# Patient Record
Sex: Female | Born: 1940 | Race: White | Hispanic: No | Marital: Married | Smoking: Former smoker
Health system: Southern US, Community
[De-identification: ages and names within clinical notes are randomized; demographics above are authoritative.]

## PROBLEM LIST (undated history)

## (undated) DIAGNOSIS — C3490 Malignant neoplasm of unspecified part of unspecified bronchus or lung: Secondary | ICD-10-CM

## (undated) DIAGNOSIS — I499 Cardiac arrhythmia, unspecified: Secondary | ICD-10-CM

## (undated) DIAGNOSIS — M069 Rheumatoid arthritis, unspecified: Secondary | ICD-10-CM

## (undated) DIAGNOSIS — K219 Gastro-esophageal reflux disease without esophagitis: Secondary | ICD-10-CM

## (undated) DIAGNOSIS — K649 Unspecified hemorrhoids: Secondary | ICD-10-CM

## (undated) DIAGNOSIS — I4819 Other persistent atrial fibrillation: Secondary | ICD-10-CM

## (undated) DIAGNOSIS — I4891 Unspecified atrial fibrillation: Secondary | ICD-10-CM

## (undated) DIAGNOSIS — E785 Hyperlipidemia, unspecified: Secondary | ICD-10-CM

## (undated) DIAGNOSIS — I10 Essential (primary) hypertension: Secondary | ICD-10-CM

## (undated) DIAGNOSIS — E669 Obesity, unspecified: Secondary | ICD-10-CM

## (undated) HISTORY — PX: KNEE SURGERY: SHX244

## (undated) HISTORY — PX: BREAST EXCISIONAL BIOPSY: SUR124

## (undated) HISTORY — DX: Malignant neoplasm of unspecified part of unspecified bronchus or lung: C34.90

---

## 1998-08-22 DIAGNOSIS — C3490 Malignant neoplasm of unspecified part of unspecified bronchus or lung: Secondary | ICD-10-CM

## 1998-08-22 HISTORY — PX: LUNG LOBECTOMY: SHX167

## 1998-08-22 HISTORY — DX: Malignant neoplasm of unspecified part of unspecified bronchus or lung: C34.90

## 2016-03-16 ENCOUNTER — Ambulatory Visit: Payer: Self-pay | Admitting: Family

## 2016-05-31 ENCOUNTER — Ambulatory Visit: Payer: Self-pay | Admitting: Internal Medicine

## 2017-01-04 ENCOUNTER — Other Ambulatory Visit: Payer: Self-pay | Admitting: Family Medicine

## 2017-01-04 DIAGNOSIS — Z1231 Encounter for screening mammogram for malignant neoplasm of breast: Secondary | ICD-10-CM

## 2017-01-04 DIAGNOSIS — Z780 Asymptomatic menopausal state: Secondary | ICD-10-CM

## 2017-01-24 ENCOUNTER — Other Ambulatory Visit: Payer: Self-pay | Admitting: Family Medicine

## 2017-01-24 DIAGNOSIS — E042 Nontoxic multinodular goiter: Secondary | ICD-10-CM

## 2017-02-02 ENCOUNTER — Inpatient Hospital Stay
Admission: RE | Admit: 2017-02-02 | Discharge: 2017-02-02 | Disposition: A | Discharge status: Home / Self Care | Payer: Self-pay | Source: Ambulatory Visit | Attending: Family Medicine | Admitting: Family Medicine

## 2017-02-02 ENCOUNTER — Other Ambulatory Visit: Payer: Self-pay

## 2017-02-27 ENCOUNTER — Other Ambulatory Visit: Payer: Self-pay | Admitting: Family Medicine

## 2017-02-27 DIAGNOSIS — E2839 Other primary ovarian failure: Secondary | ICD-10-CM

## 2017-02-27 DIAGNOSIS — Z780 Asymptomatic menopausal state: Secondary | ICD-10-CM

## 2017-02-28 ENCOUNTER — Other Ambulatory Visit: Payer: Self-pay

## 2017-03-29 ENCOUNTER — Other Ambulatory Visit: Payer: Self-pay

## 2017-04-25 ENCOUNTER — Ambulatory Visit
Admission: RE | Admit: 2017-04-25 | Discharge: 2017-04-25 | Disposition: A | Discharge status: Home / Self Care | Payer: Medicare HMO | Source: Ambulatory Visit | Attending: Family Medicine | Admitting: Family Medicine

## 2017-04-25 DIAGNOSIS — Z780 Asymptomatic menopausal state: Secondary | ICD-10-CM

## 2017-04-25 DIAGNOSIS — E2839 Other primary ovarian failure: Secondary | ICD-10-CM

## 2017-04-25 DIAGNOSIS — E042 Nontoxic multinodular goiter: Secondary | ICD-10-CM

## 2017-04-25 DIAGNOSIS — Z1231 Encounter for screening mammogram for malignant neoplasm of breast: Secondary | ICD-10-CM

## 2017-10-21 ENCOUNTER — Emergency Department (HOSPITAL_COMMUNITY): Payer: Medicare HMO

## 2017-10-21 ENCOUNTER — Encounter (HOSPITAL_COMMUNITY): Payer: Self-pay | Admitting: *Deleted

## 2017-10-21 ENCOUNTER — Inpatient Hospital Stay (HOSPITAL_COMMUNITY)
Admission: EM | Admit: 2017-10-21 | Discharge: 2017-10-26 | DRG: 871 | Disposition: A | Discharge status: Home Health | Payer: Medicare HMO | Attending: Family Medicine | Admitting: Family Medicine

## 2017-10-21 ENCOUNTER — Encounter (HOSPITAL_COMMUNITY): Payer: Self-pay | Admitting: Emergency Medicine

## 2017-10-21 ENCOUNTER — Emergency Department (HOSPITAL_COMMUNITY)
Admission: EM | Admit: 2017-10-21 | Discharge: 2017-10-21 | Disposition: A | Discharge status: Home / Self Care | Payer: Medicare HMO | Source: Home / Self Care | Attending: Emergency Medicine | Admitting: Emergency Medicine

## 2017-10-21 ENCOUNTER — Other Ambulatory Visit: Payer: Self-pay

## 2017-10-21 DIAGNOSIS — R509 Fever, unspecified: Secondary | ICD-10-CM | POA: Insufficient documentation

## 2017-10-21 DIAGNOSIS — J189 Pneumonia, unspecified organism: Secondary | ICD-10-CM | POA: Diagnosis present

## 2017-10-21 DIAGNOSIS — Y92013 Bedroom of single-family (private) house as the place of occurrence of the external cause: Secondary | ICD-10-CM | POA: Insufficient documentation

## 2017-10-21 DIAGNOSIS — Z96651 Presence of right artificial knee joint: Secondary | ICD-10-CM

## 2017-10-21 DIAGNOSIS — R531 Weakness: Secondary | ICD-10-CM

## 2017-10-21 DIAGNOSIS — I4819 Other persistent atrial fibrillation: Secondary | ICD-10-CM | POA: Diagnosis present

## 2017-10-21 DIAGNOSIS — J181 Lobar pneumonia, unspecified organism: Secondary | ICD-10-CM | POA: Insufficient documentation

## 2017-10-21 DIAGNOSIS — W06XXXA Fall from bed, initial encounter: Secondary | ICD-10-CM

## 2017-10-21 DIAGNOSIS — J9601 Acute respiratory failure with hypoxia: Secondary | ICD-10-CM | POA: Diagnosis not present

## 2017-10-21 DIAGNOSIS — I10 Essential (primary) hypertension: Secondary | ICD-10-CM | POA: Diagnosis not present

## 2017-10-21 DIAGNOSIS — E785 Hyperlipidemia, unspecified: Secondary | ICD-10-CM | POA: Diagnosis not present

## 2017-10-21 DIAGNOSIS — Z885 Allergy status to narcotic agent status: Secondary | ICD-10-CM

## 2017-10-21 DIAGNOSIS — J441 Chronic obstructive pulmonary disease with (acute) exacerbation: Secondary | ICD-10-CM | POA: Diagnosis present

## 2017-10-21 DIAGNOSIS — I4891 Unspecified atrial fibrillation: Secondary | ICD-10-CM | POA: Diagnosis not present

## 2017-10-21 DIAGNOSIS — Z79891 Long term (current) use of opiate analgesic: Secondary | ICD-10-CM

## 2017-10-21 DIAGNOSIS — A4189 Other specified sepsis: Principal | ICD-10-CM | POA: Diagnosis present

## 2017-10-21 DIAGNOSIS — I481 Persistent atrial fibrillation: Secondary | ICD-10-CM | POA: Diagnosis present

## 2017-10-21 DIAGNOSIS — Z85118 Personal history of other malignant neoplasm of bronchus and lung: Secondary | ICD-10-CM

## 2017-10-21 DIAGNOSIS — D696 Thrombocytopenia, unspecified: Secondary | ICD-10-CM | POA: Diagnosis present

## 2017-10-21 DIAGNOSIS — J440 Chronic obstructive pulmonary disease with acute lower respiratory infection: Secondary | ICD-10-CM | POA: Diagnosis present

## 2017-10-21 DIAGNOSIS — J1000 Influenza due to other identified influenza virus with unspecified type of pneumonia: Secondary | ICD-10-CM | POA: Diagnosis present

## 2017-10-21 DIAGNOSIS — J9811 Atelectasis: Secondary | ICD-10-CM | POA: Diagnosis present

## 2017-10-21 DIAGNOSIS — R05 Cough: Secondary | ICD-10-CM | POA: Insufficient documentation

## 2017-10-21 DIAGNOSIS — M1120 Other chondrocalcinosis, unspecified site: Secondary | ICD-10-CM | POA: Diagnosis present

## 2017-10-21 DIAGNOSIS — Z87891 Personal history of nicotine dependence: Secondary | ICD-10-CM

## 2017-10-21 DIAGNOSIS — J129 Viral pneumonia, unspecified: Secondary | ICD-10-CM | POA: Diagnosis present

## 2017-10-21 DIAGNOSIS — Z6841 Body Mass Index (BMI) 40.0 and over, adult: Secondary | ICD-10-CM

## 2017-10-21 DIAGNOSIS — Z882 Allergy status to sulfonamides status: Secondary | ICD-10-CM

## 2017-10-21 DIAGNOSIS — Y92003 Bedroom of unspecified non-institutional (private) residence as the place of occurrence of the external cause: Secondary | ICD-10-CM

## 2017-10-21 DIAGNOSIS — Z79899 Other long term (current) drug therapy: Secondary | ICD-10-CM

## 2017-10-21 HISTORY — DX: Essential (primary) hypertension: I10

## 2017-10-21 LAB — COMPREHENSIVE METABOLIC PANEL
ALT: 38 U/L (ref 14–54)
AST: 48 U/L — AB (ref 15–41)
Albumin: 3.2 g/dL — ABNORMAL LOW (ref 3.5–5.0)
Alkaline Phosphatase: 83 U/L (ref 38–126)
Anion gap: 7 (ref 5–15)
BILIRUBIN TOTAL: 0.7 mg/dL (ref 0.3–1.2)
BUN: 15 mg/dL (ref 6–20)
CHLORIDE: 100 mmol/L — AB (ref 101–111)
CO2: 25 mmol/L (ref 22–32)
Calcium: 8.7 mg/dL — ABNORMAL LOW (ref 8.9–10.3)
Creatinine, Ser: 0.96 mg/dL (ref 0.44–1.00)
GFR calc Af Amer: >60 mL/min (ref >60)
GFR, EST NON AFRICAN AMERICAN: 57 mL/min — AB (ref >60)
Glucose, Bld: 115 mg/dL — ABNORMAL HIGH (ref 65–99)
Potassium: 4.1 mmol/L (ref 3.5–5.1)
Sodium: 132 mmol/L — ABNORMAL LOW (ref 135–145)
TOTAL PROTEIN: 6.8 g/dL (ref 6.5–8.1)

## 2017-10-21 LAB — BASIC METABOLIC PANEL
Anion gap: 7 (ref 5–15)
BUN: 14 mg/dL (ref 6–20)
CHLORIDE: 104 mmol/L (ref 101–111)
CO2: 24 mmol/L (ref 22–32)
CREATININE: 0.73 mg/dL (ref 0.44–1.00)
Calcium: 7.8 mg/dL — ABNORMAL LOW (ref 8.9–10.3)
GFR calc Af Amer: >60 mL/min (ref >60)
GFR calc non Af Amer: >60 mL/min (ref >60)
GLUCOSE: 126 mg/dL — AB (ref 65–99)
POTASSIUM: 3.2 mmol/L — AB (ref 3.5–5.1)
SODIUM: 135 mmol/L (ref 135–145)

## 2017-10-21 LAB — CBC WITH DIFFERENTIAL/PLATELET
Basophils Absolute: 0 10*3/uL (ref 0.0–0.1)
Basophils Relative: 0 %
EOS ABS: 0 10*3/uL (ref 0.0–0.7)
Eosinophils Relative: 0 %
HCT: 38.7 % (ref 36.0–46.0)
HEMOGLOBIN: 12.9 g/dL (ref 12.0–15.0)
LYMPHS ABS: 0.6 10*3/uL — AB (ref 0.7–4.0)
LYMPHS PCT: 11 %
MCH: 32.1 pg (ref 26.0–34.0)
MCHC: 33.3 g/dL (ref 30.0–36.0)
MCV: 96.3 fL (ref 78.0–100.0)
MONOS PCT: 5 %
Monocytes Absolute: 0.3 10*3/uL (ref 0.1–1.0)
NEUTROS PCT: 84 %
Neutro Abs: 4.8 10*3/uL (ref 1.7–7.7)
Platelets: 139 10*3/uL — ABNORMAL LOW (ref 150–400)
RBC: 4.02 MIL/uL (ref 3.87–5.11)
RDW: 13.9 % (ref 11.5–15.5)
WBC: 5.7 10*3/uL (ref 4.0–10.5)

## 2017-10-21 LAB — I-STAT TROPONIN, ED: TROPONIN I, POC: 0.01 ng/mL (ref 0.00–0.08)

## 2017-10-21 LAB — I-STAT CG4 LACTIC ACID, ED: Lactic Acid, Venous: 0.64 mmol/L (ref 0.5–1.9)

## 2017-10-21 LAB — CK: CK TOTAL: 41 U/L (ref 38–234)

## 2017-10-21 IMAGING — CR DG CHEST 2V
2 series · 2 of 2 positions shown · non-contrast
Comparison: None.

CLINICAL DATA: Pt c/o diffuse back and leg pain, worse at hip s/p
slip, fall from bed (pt states she slipped from her satin sheets)
this am.

EXAM:
CHEST  2 VIEW

[w chest lat]
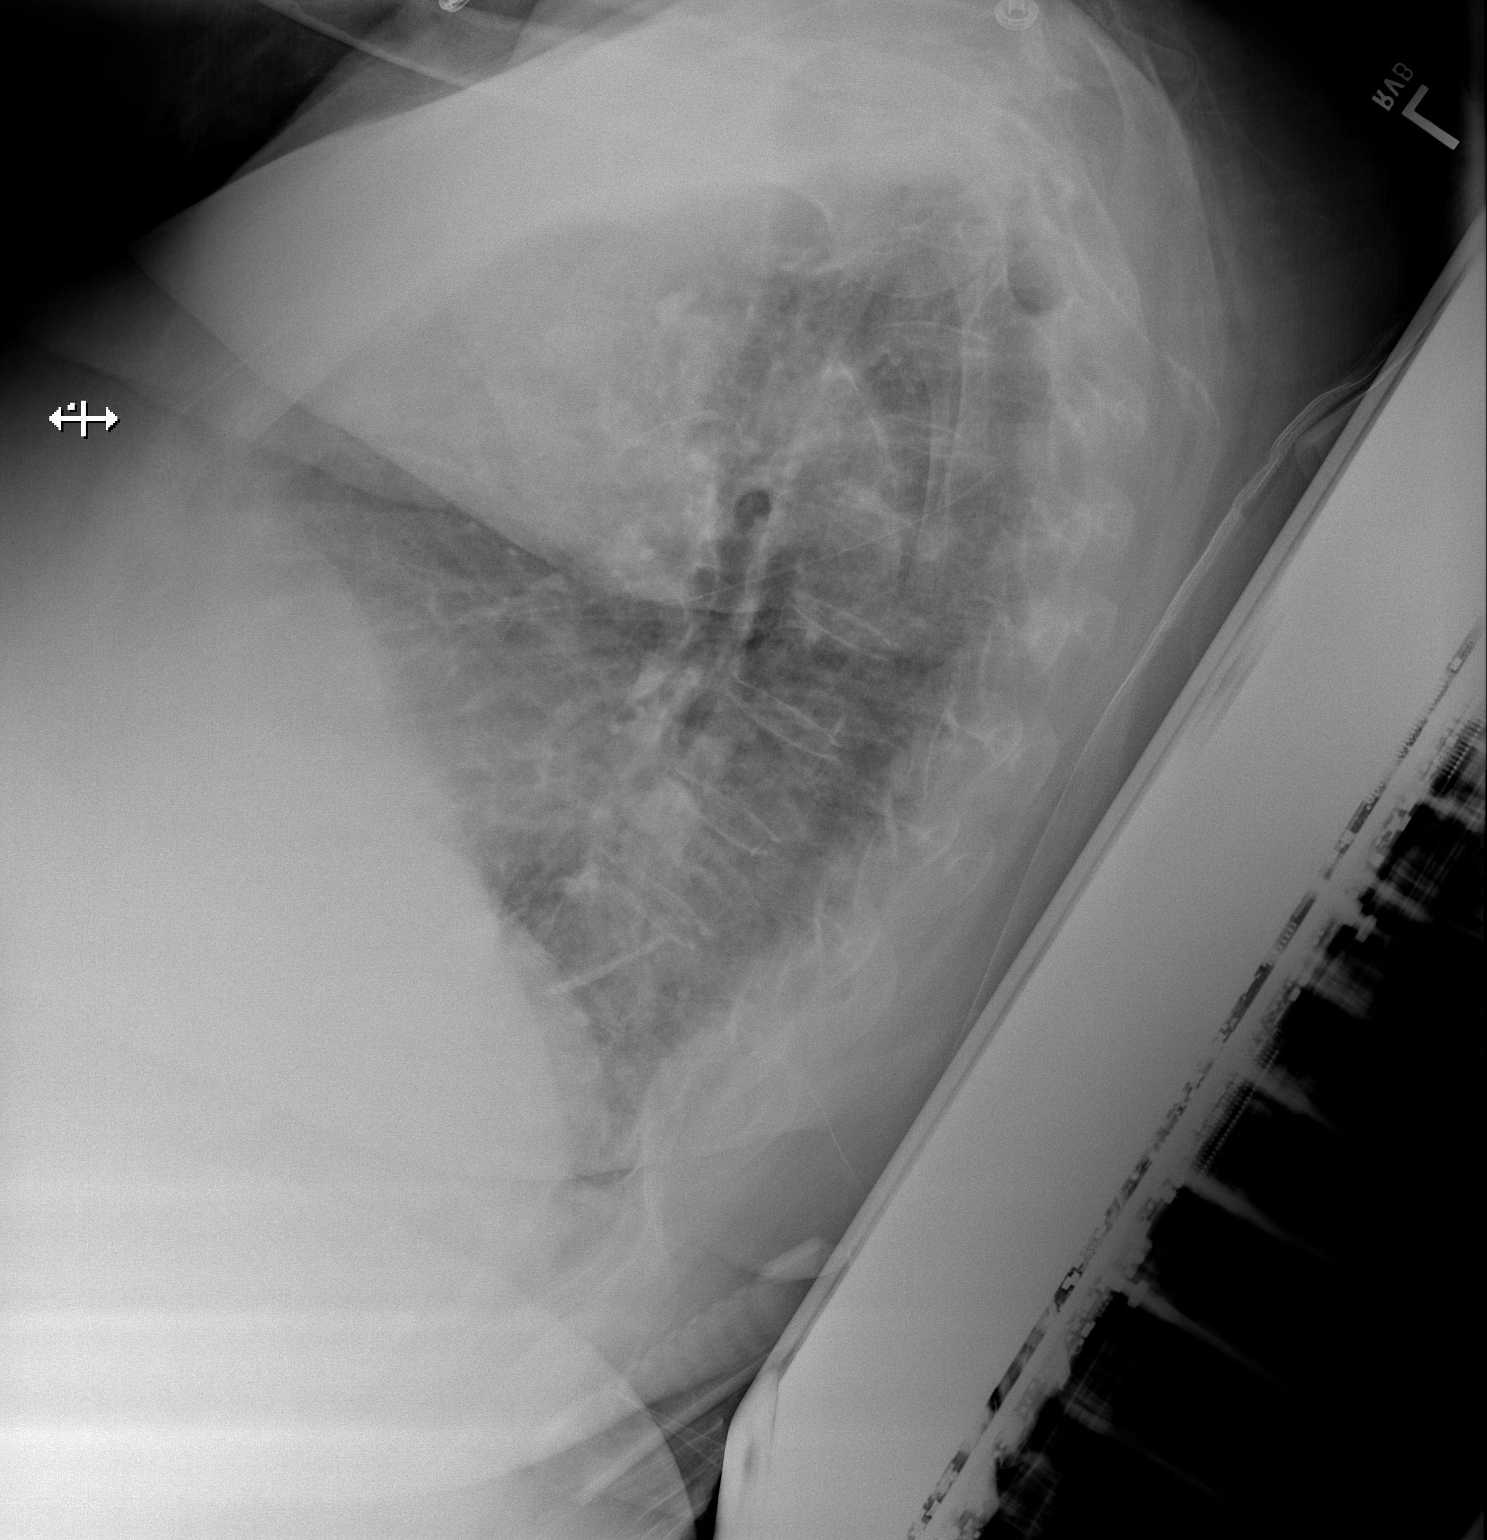

[x chest ap]
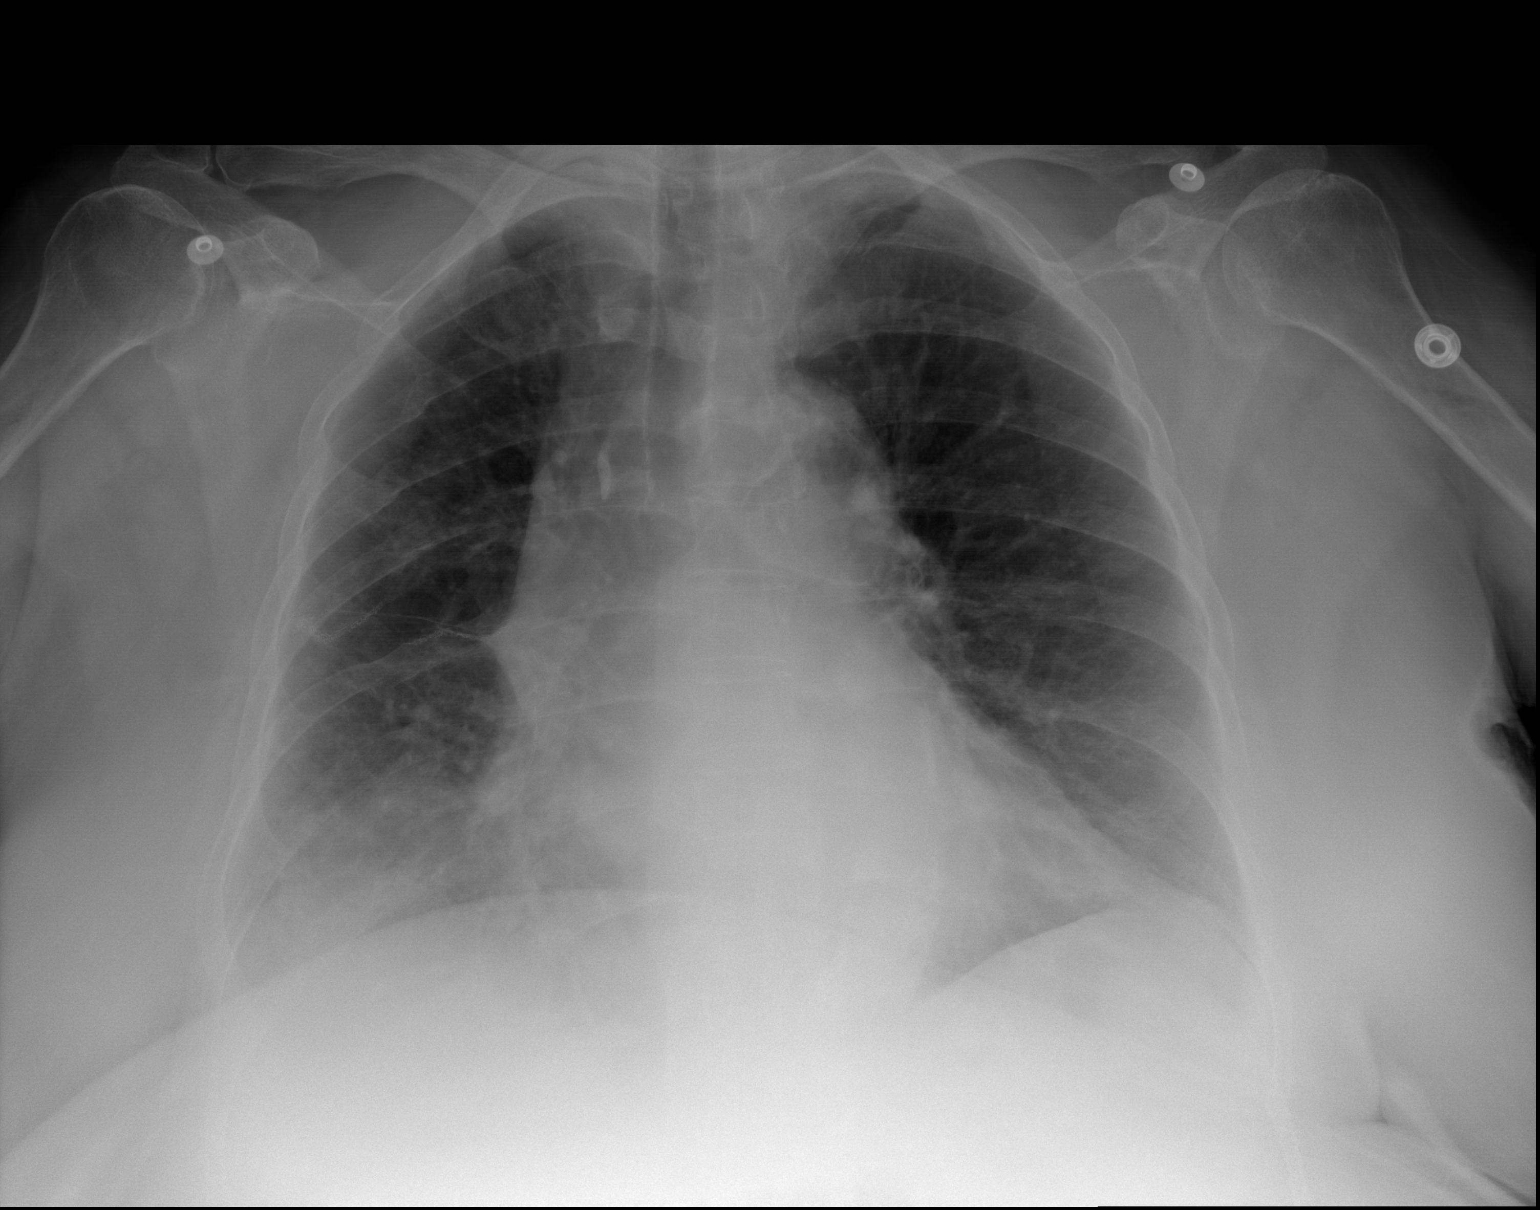

[2 of 2 positions shown; findings below may reference images not displayed]

FINDINGS: Cardiac silhouette is normal in size. No mediastinal or hilar
masses.

There is hazy opacity at the lung bases, right greater than left. On
the right, the silhouettes the hemidiaphragm. This may reflect a
combination of atelectasis with a small effusion on the right and
atelectasis on the left. Pneumonia should be considered likely if
there are consistent clinical symptoms. No convincing pulmonary
edema. No pneumothorax.

There are old healed right rib fractures. Skeletal structures are
demineralized.
IMPRESSION: 1. Right greater than left lung base opacity. This may reflect
pneumonia. Alternatively, this may be due to atelectasis, with a
possible pleural effusion on the right.

## 2017-10-21 IMAGING — CR DG HIP (WITH OR WITHOUT PELVIS) 2-3V*R*
4 series · 4 of 4 positions shown · non-contrast
Comparison: None.

CLINICAL DATA: Pt c/o diffuse leg pain, worse at hip s/p slip, fall
from bed (pt states she slipped from her satin sheets) this am.

EXAM:
DG HIP (WITH OR WITHOUT PELVIS) 2-3V RIGHT

[w hip lat right]
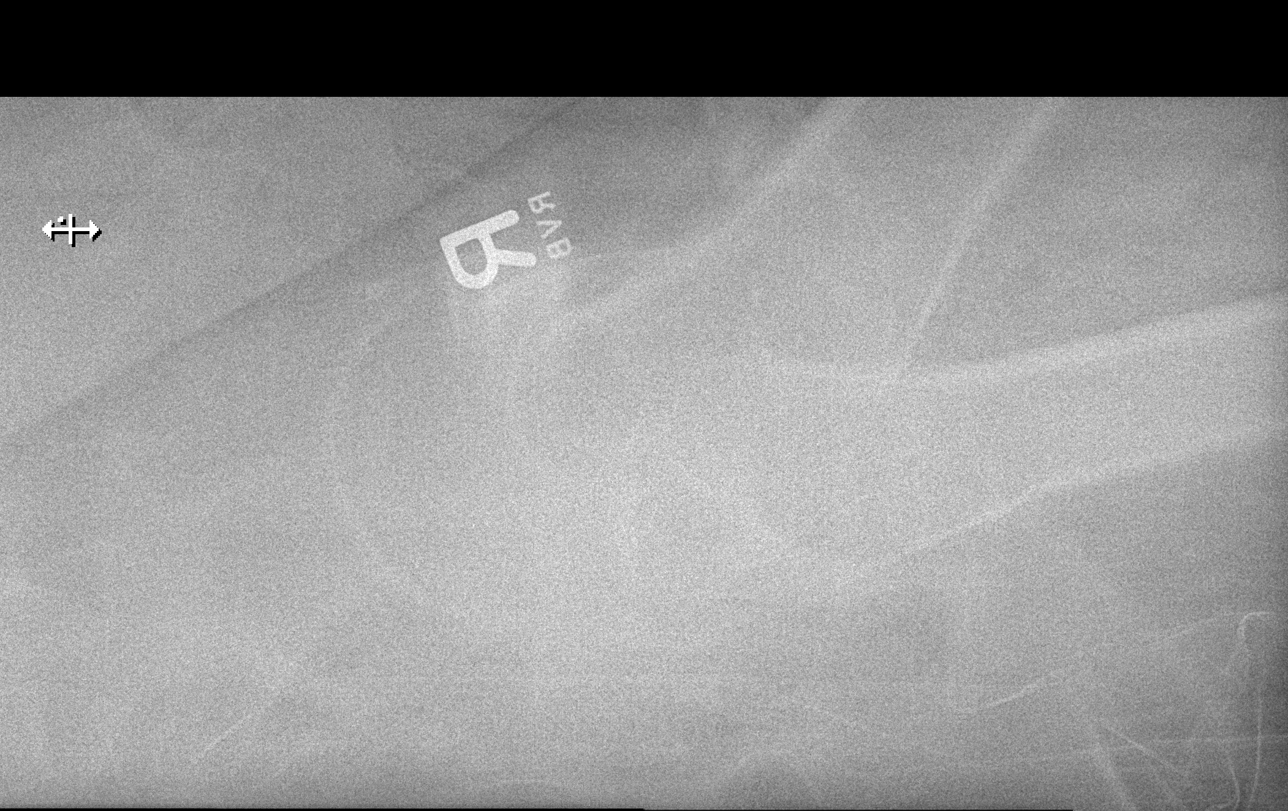

[x pelvis]
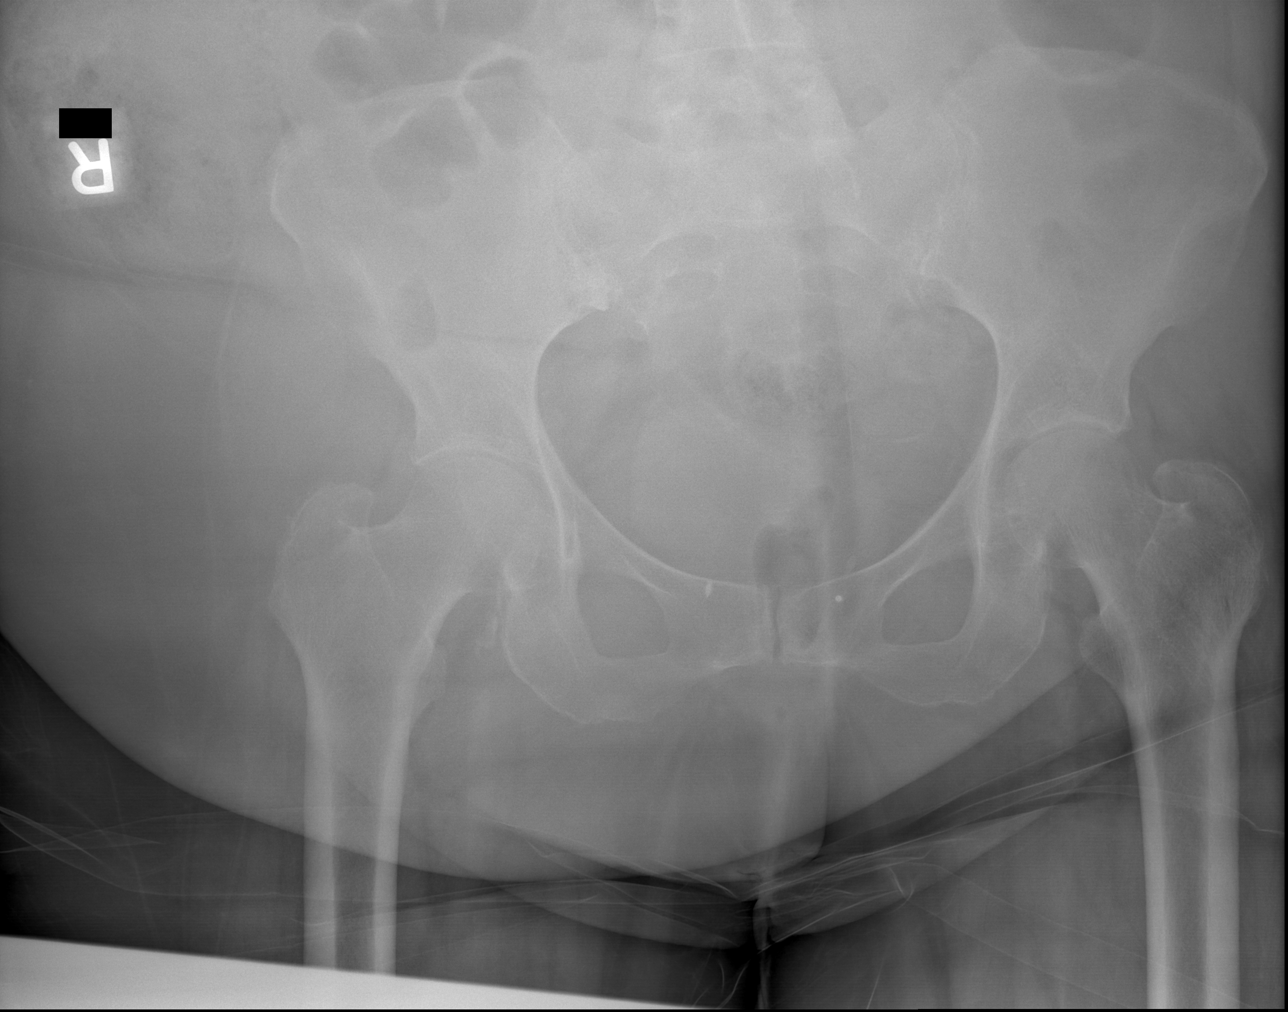

[x hip ap right]
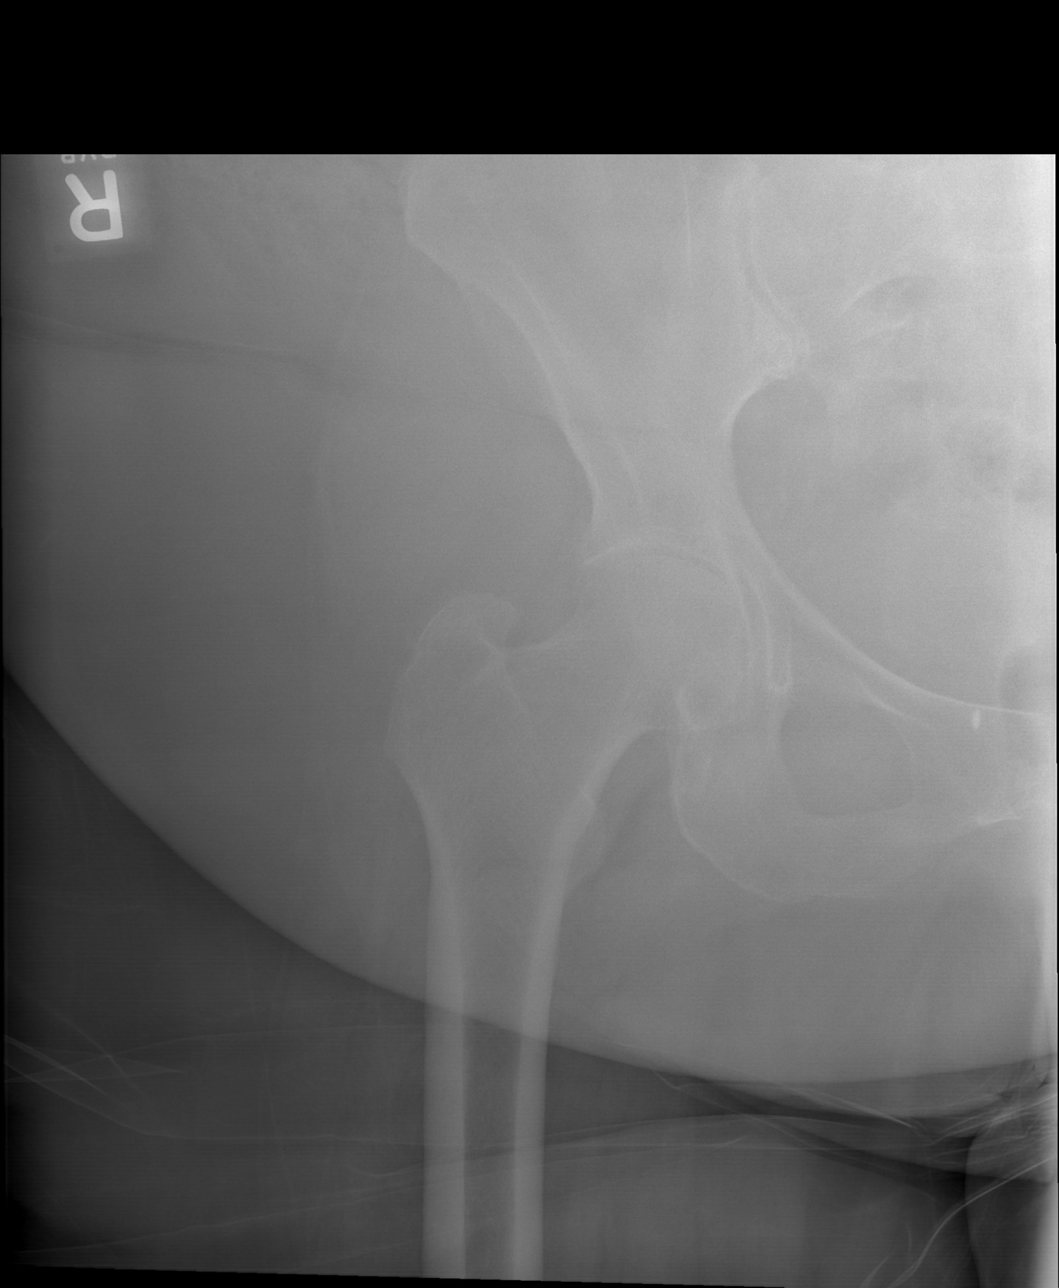

[x hip lat right]
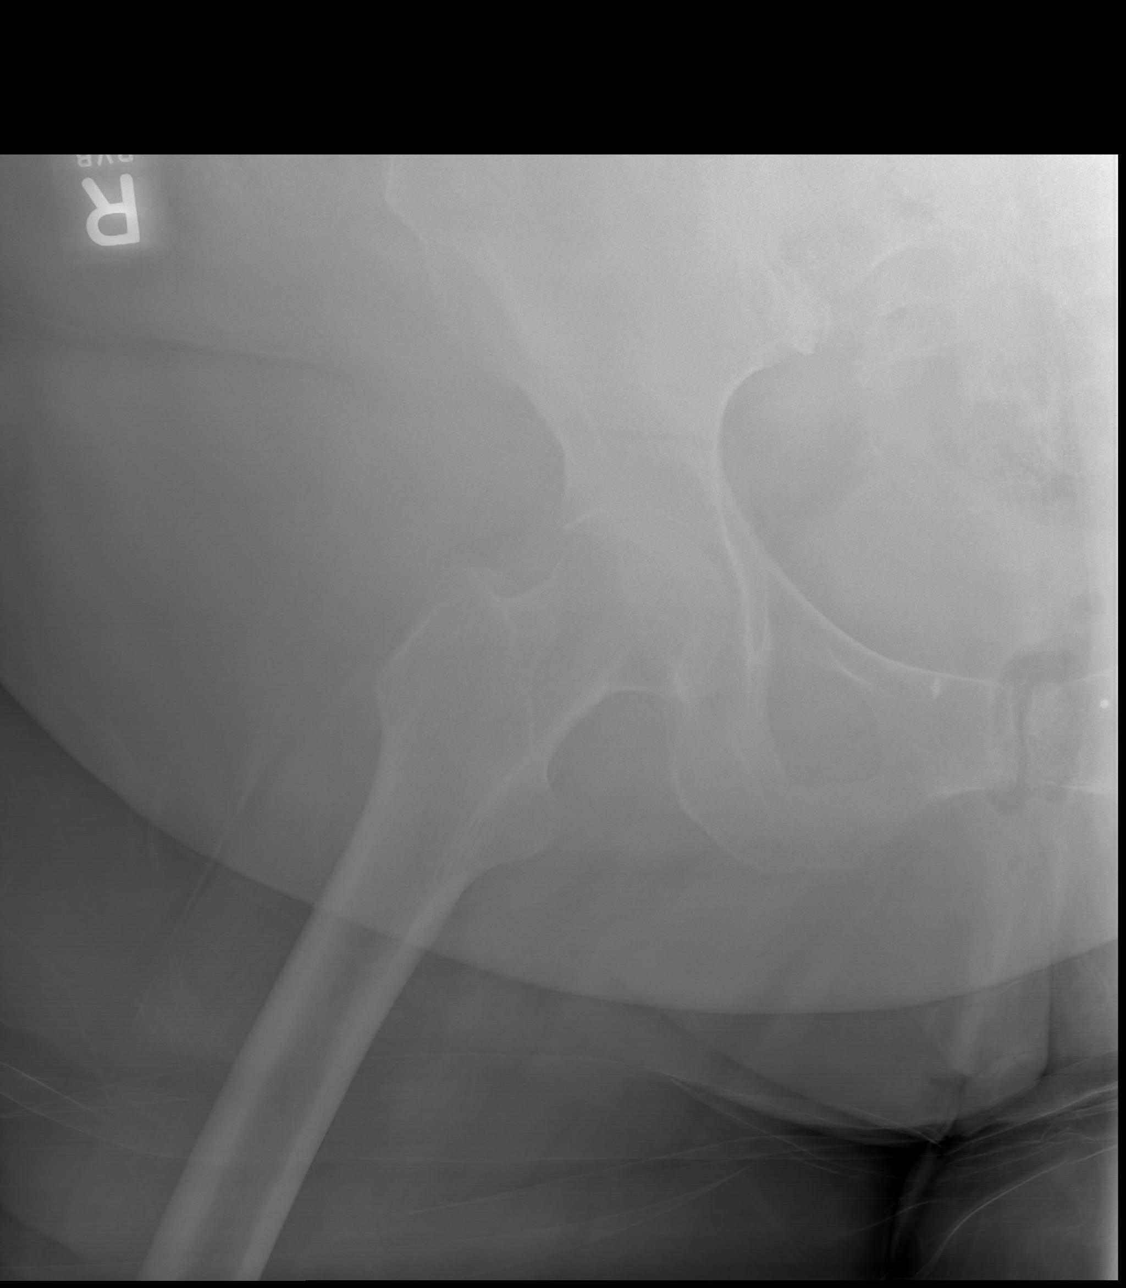

[4 of 4 positions shown; findings below may reference images not displayed]

FINDINGS: No fracture.  No bone lesion.

There is mild bilateral concentric hip joint space narrowing. No
other arthropathic change.

SI joints and symphysis pubis are normally aligned.

Soft tissues are unremarkable.
IMPRESSION: 1. No fracture or dislocation.  No acute finding.

## 2017-10-21 IMAGING — CR DG KNEE COMPLETE 4+V*R*
4 series · 4 of 4 positions shown · non-contrast
Comparison: None.

CLINICAL DATA: Pt c/o diffuse back and leg pain, worse at hip s/p
slip, fall from bed (pt states she slipped from her satin sheets)
this am.

EXAM:
RIGHT KNEE - COMPLETE 4+ VIEW

[x knee ap right (1 of 4)]
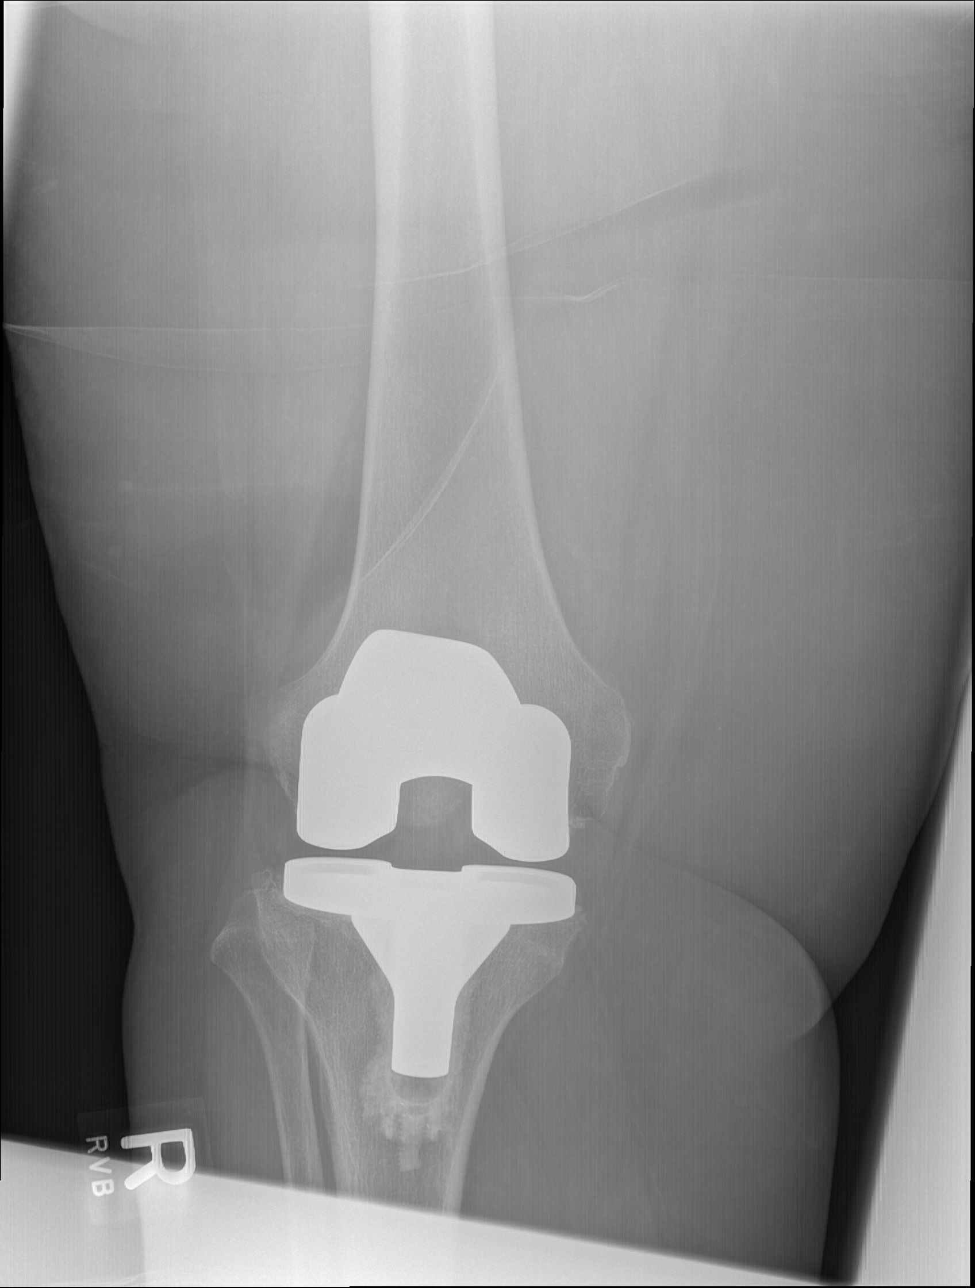

[x knee ap right (2 of 4)]
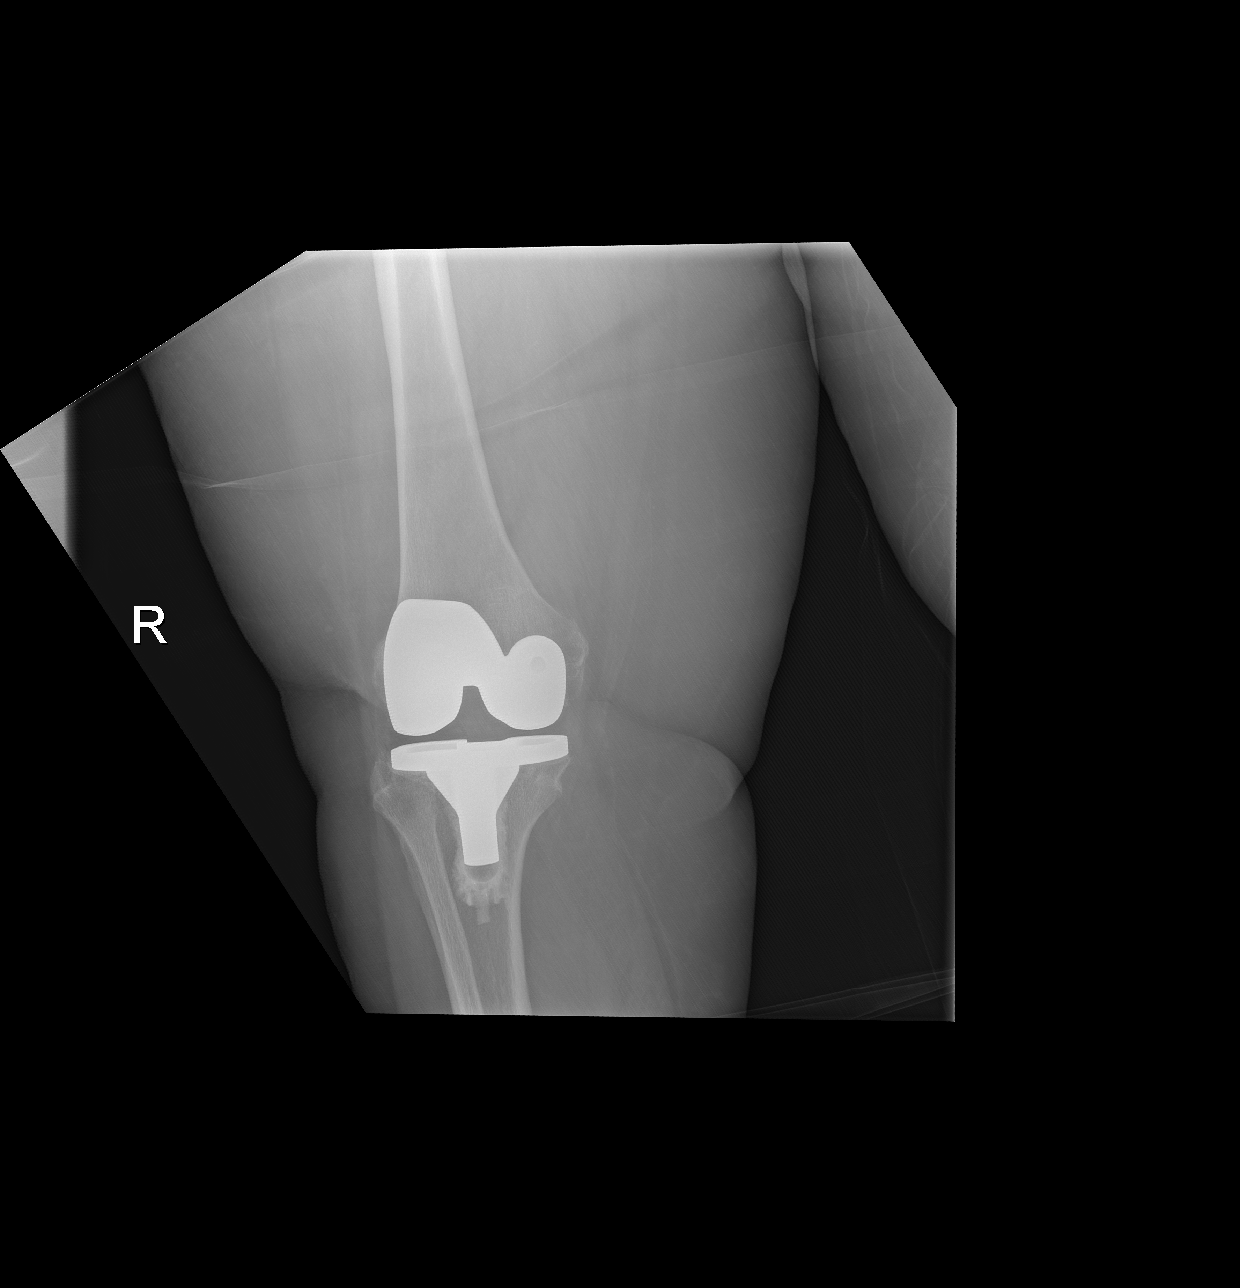

[x knee ap right (3 of 4)]
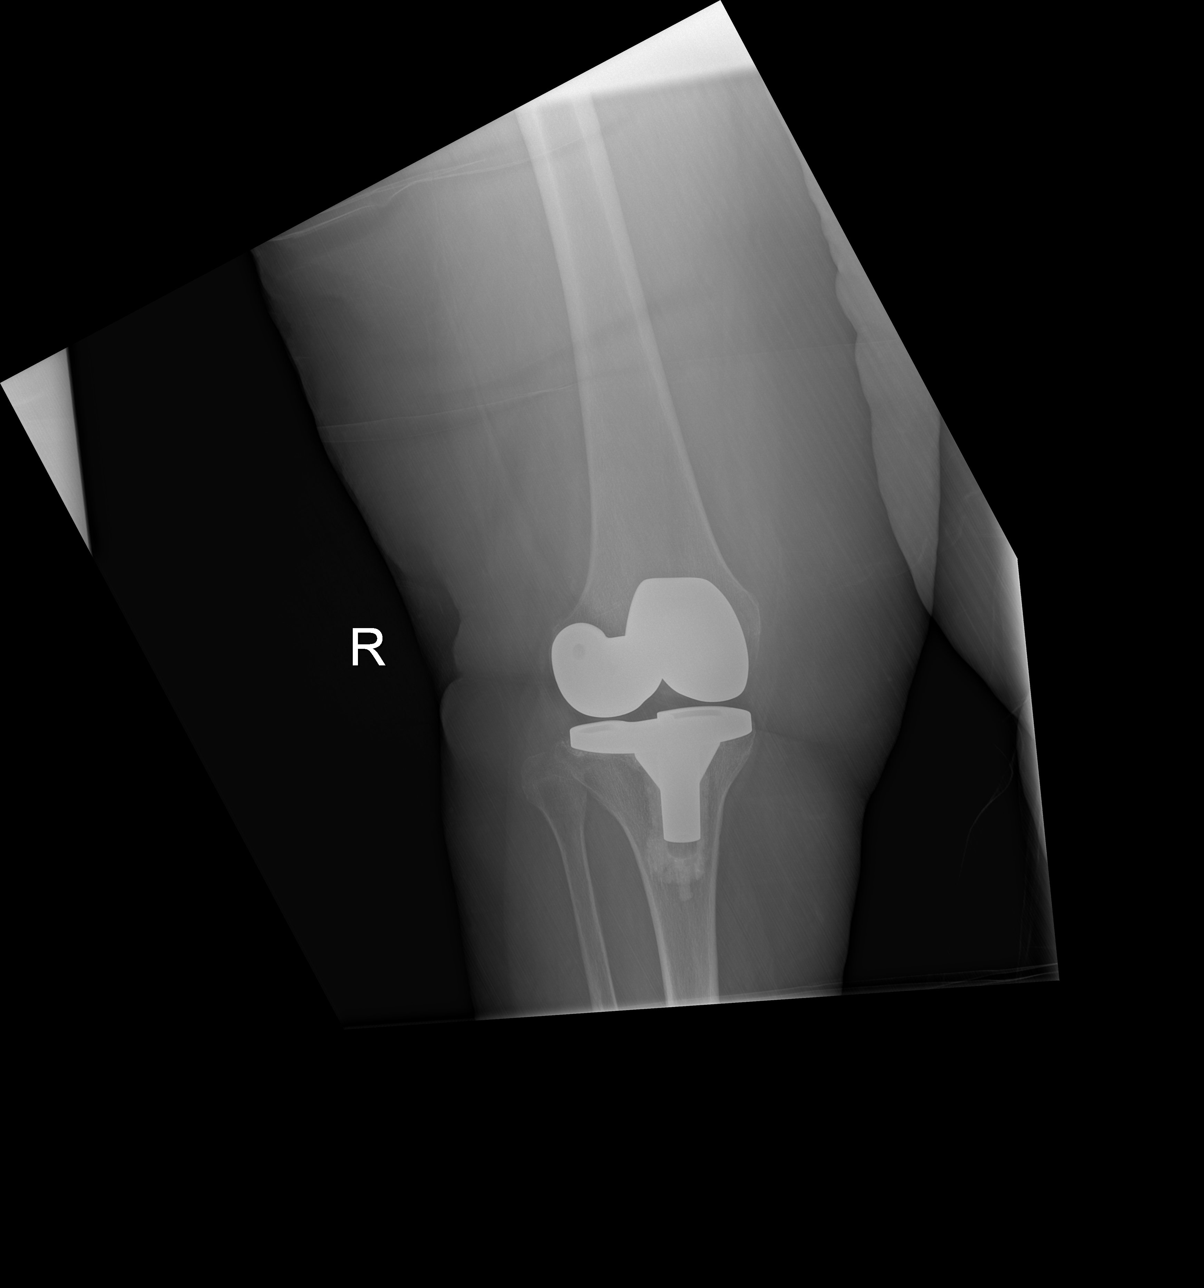

[x knee ap right (4 of 4)]
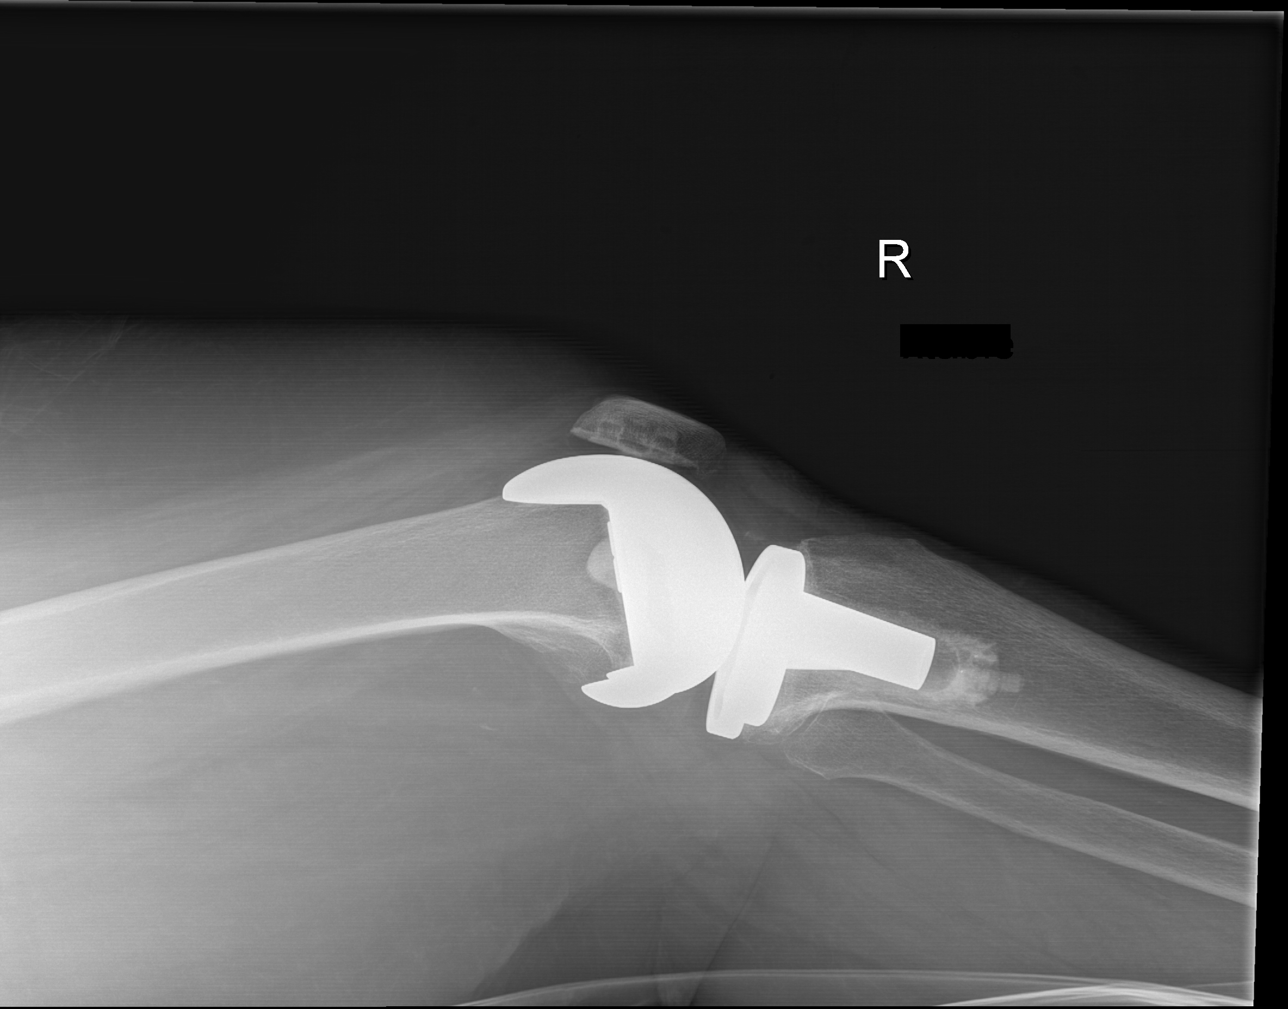

[4 of 4 positions shown; findings below may reference images not displayed]

FINDINGS: No fracture.  No bone lesion.

The knee prosthetic components appear well seated and well aligned.

Soft tissues are unremarkable.
IMPRESSION: 1. No fracture or dislocation.
2. No evidence of loosening of the orthopedic hardware.

## 2017-10-21 MED ORDER — CEFTRIAXONE SODIUM 1 G IJ SOLR
1.0000 g | Freq: Once | INTRAMUSCULAR | Status: AC
  Administered 2017-10-21: 1 g via INTRAVENOUS
  Filled 2017-10-21: qty 10

## 2017-10-21 MED ORDER — ACETAMINOPHEN 500 MG PO TABS
1000.0000 mg | ORAL_TABLET | Freq: Once | ORAL | Status: DC
  Filled 2017-10-21: qty 2

## 2017-10-21 NOTE — ED Notes (Signed)
Pt refused tylenol dose.

## 2017-10-21 NOTE — ED Notes (Signed)
Bed: WA21 Expected date:  Expected time:  Means of arrival:  Comments: 74 f weakness

## 2017-10-21 NOTE — H&P (Signed)
History and Physical    Leah Velasquez GUY:[id number] DOB: 01/14/1941 DOA: 10/21/2017  Referring MD/NP/PA: Dr. Wendall Stade PCP: [name], MD  Patient coming from: home via EMS  Chief Complaint: Weakness  I have personally briefly reviewed patient's old medical records in [place]   HPI: Leah Velasquez is a 77 y.o. female with medical history significant of remote history of lung cancer, HTN, and morbid obesity; who presents with complaints of weakness.  She reports having a productive cough with fever up to 101 F at home for the last 3 days.  She was seen in the emergency department early this morning after a fall.  Evaluation revealed no acute fractures and concerns for pneumonia on chest x-ray.  She had been discharged home with Mucinex and a Z-Pak.  After getting home patient reported feeling significantly weak and being unable to walk due to feeling like her legs will give out.  She does not report falling again.  Associated symptoms include sinus congestion, sore throat, palpitations, and some diarrhea.  Denies having any chest pain, vomiting, dysuria, or loss of consciousness.  Patient reports that she got her flu vaccine this year and was just checked for the fluid in her primary care office yesterday, but the test was negative.  En route with EMS patient's O2 saturations were noted to be as low as 85% on room air.    ED Course: Upon admission into the emergency department patient was noted to be febrile up to 100.9 F, pulse 76-92, respiration 15-22, blood pressure 106/55-152/74, O2 saturation 94-99% on 2 L of nasal cannula oxygen.  Labs revealed WBC 5.7, platelets 139, potassium 3.2, calcium 7.8, lactic acid 0.64.  Chest x-ray revealed right and left lung opacities concerning for pneumonia.  Patient had already been given ceftriaxone and azithromycin earlier in the day during first evaluation.  TRH called to admit.  Review of Systems  Constitutional: Positive for fever.  HENT:  Positive for sore throat. Negative for ear pain.   Eyes: Negative for pain and discharge.  Respiratory: Positive for cough, sputum production, shortness of breath and wheezing.   Cardiovascular: Positive for palpitations and leg swelling. Negative for chest pain.  Gastrointestinal: Positive for diarrhea.  Genitourinary: Negative for dysuria and frequency.  Musculoskeletal: Positive for falls and myalgias.  Skin: Negative for itching and rash.  Neurological: Positive for dizziness and weakness. Negative for focal weakness and loss of consciousness.  Psychiatric/Behavioral: Negative for substance abuse.    Past Medical History:  Diagnosis Date  . Cancer ([place])    lung cancer  . Hypertension     Past Surgical History:  Procedure Laterality Date  . BREAST EXCISIONAL BIOPSY Left    x 2  . BREAST EXCISIONAL BIOPSY Right      reports that  has never smoked. she has never used smokeless tobacco. She reports that she does not drink alcohol or use drugs.  Allergies  Allergen Reactions  . Morphine Anaphylaxis  . Sulfa Antibiotics Rash    History reviewed. No pertinent family history.  Prior to Admission medications   Medication Sig Start Date End Date Taking? Authorizing Provider  azithromycin (ZITHROMAX) 250 MG tablet Take 250-500 mg by mouth daily. Take 500 mg on day 1 and 250 mg on days 2-5 10/20/17   [provider]  colchicine (COLCRYS) 0.6 MG tablet Take 0.6 mg by mouth every other day. 03/27/17   [provider]  dextromethorphan-guaiFENesin (MUCINEX DM) 30-600 MG 12hr tablet Take 1  tablet by mouth 2 (two) times daily as needed for cough.    [provider]  fluticasone (FLONASE) 50 MCG/ACT nasal spray Place 1 spray into both nostrils daily.  08/30/17   [provider]  furosemide (LASIX) 20 MG tablet Take 20 mg by mouth daily as needed for edema. 10/18/16   [provider]  levocetirizine (XYZAL) 5 MG tablet Take 5 mg by mouth daily. 09/22/17    [provider]  losartan (COZAAR) 100 MG tablet Take 100 mg by mouth daily. 08/04/17   [provider]  simvastatin (ZOCOR) 20 MG tablet Take 20 mg by mouth at bedtime. 07/17/17   [provider]  timolol (TIMOPTIC) 0.5 % ophthalmic solution Place 1 drop into both eyes 2 (two) times daily. 09/13/17   [provider]  traMADol (ULTRAM) 50 MG tablet Take 50 mg by mouth daily. 10/03/17   [provider]  zolpidem (AMBIEN) 5 MG tablet Take 5 mg by mouth at bedtime. 07/17/17   [provider]    Physical Exam:  Constitutional: NAD, calm, comfortable Vitals:   10/21/17 2127 10/21/17 2139 10/21/17 2140 10/21/17 2236  BP: (!) 152/74   (!) 151/70  Pulse: 92   91  Resp: 15   18  Temp: (!) 100.9 F (38.3 C)     TempSrc: Oral     SpO2: 98% 97%  95%  Weight: 113.4 kg (250 lb)  113.4 kg (250 lb)   Height: 5\' 2"  (1.575 m)  5\' 2"  (1.575 m)    Eyes: PERRL, lids and conjunctivae normal ENMT: Mucous membranes are moist. Posterior pharynx clear of any exudate or lesions.Normal dentition.  Neck: normal, supple, no masses, no thyromegaly Respiratory: clear to auscultation bilaterally, no wheezing, no crackles. Normal respiratory effort. No accessory muscle use.  Cardiovascular: Regular rate and rhythm, no murmurs / rubs / gallops. No extremity edema. 2+ pedal pulses. No carotid bruits.  Abdomen: no tenderness, no masses palpated. No hepatosplenomegaly. Bowel sounds positive.  Musculoskeletal: no clubbing / cyanosis. No joint deformity upper and lower extremities. Good ROM, no contractures. Normal muscle tone.  Skin: no rashes, lesions, ulcers. No induration Neurologic: CN 2-12 grossly intact. Sensation intact, DTR normal. Strength 5/5 in all 4.  Psychiatric: Normal judgment and insight. Alert and oriented x 3. Normal mood.     Labs on Admission: I have personally reviewed following labs and imaging studies  CBC: Recent Labs  Lab 10/21/17 0647    WBC 5.7  NEUTROABS 4.8  HGB 12.9  HCT 38.7  MCV 96.3  PLT 371*   Basic Metabolic Panel: Recent Labs  Lab 10/21/17 0647  NA 135  K 3.2*  CL 104  CO2 24  GLUCOSE 126*  BUN 14  CREATININE 0.73  CALCIUM 7.8*   GFR: Estimated Creatinine Clearance: 73.4 mL/min (by C-G formula based on SCr of 0.73 mg/dL). Liver Function Tests: No results for input(s): AST, ALT, ALKPHOS, BILITOT, PROT, ALBUMIN in the last 168 hours. No results for input(s): LIPASE, AMYLASE in the last 168 hours. No results for input(s): AMMONIA in the last 168 hours. Coagulation Profile: No results for input(s): INR, PROTIME in the last 168 hours. Cardiac Enzymes: Recent Labs  Lab 10/21/17 0647  CKTOTAL 41   BNP (last 3 results) No results for input(s): PROBNP in the last 8760 hours. HbA1C: No results for input(s): HGBA1C in the last 72 hours. CBG: No results for input(s): GLUCAP in the last 168 hours. Lipid Profile: No results for input(s):  CHOL, HDL, LDLCALC, TRIG, CHOLHDL, LDLDIRECT in the last 72 hours. Thyroid Function Tests: No results for input(s): TSH, T4TOTAL, FREET4, T3FREE, THYROIDAB in the last 72 hours. Anemia Panel: No results for input(s): VITAMINB12, FOLATE, FERRITIN, TIBC, IRON, RETICCTPCT in the last 72 hours. Urine analysis: No results found for: COLORURINE, APPEARANCEUR, LABSPEC, PHURINE, GLUCOSEU, HGBUR, BILIRUBINUR, KETONESUR, PROTEINUR, UROBILINOGEN, NITRITE, LEUKOCYTESUR Sepsis Labs: No results found for this or any previous visit (from the past 240 hour(s)).   Radiological Exams on Admission: Dg Chest 2 View  Result Date: 10/21/2017 CLINICAL DATA:  Pt c/o diffuse back and leg pain, worse at hip s/p slip, fall from bed (pt states she slipped from her satin sheets) this am. EXAM: CHEST  2 VIEW COMPARISON:  None. FINDINGS: Cardiac silhouette is normal in size. No mediastinal or hilar masses. There is hazy opacity at the lung bases, right greater than left. On the right, the  silhouettes the hemidiaphragm. This may reflect a combination of atelectasis with a small effusion on the right and atelectasis on the left. Pneumonia should be considered likely if there are consistent clinical symptoms. No convincing pulmonary edema. No pneumothorax. There are old healed right rib fractures. Skeletal structures are demineralized. IMPRESSION: 1. Right greater than left lung base opacity. This may reflect pneumonia. Alternatively, this may be due to atelectasis, with a possible pleural effusion on the right. Electronically Signed   By: Lajean Manes M.D.   On: 10/21/2017 07:26   Dg Knee Complete 4 Views Right  Result Date: 10/21/2017 CLINICAL DATA:  Pt c/o diffuse back and leg pain, worse at hip s/p slip, fall from bed (pt states she slipped from her satin sheets) this am. EXAM: RIGHT KNEE - COMPLETE 4+ VIEW COMPARISON:  None. FINDINGS: No fracture.  No bone lesion. The knee prosthetic components appear well seated and well aligned. Soft tissues are unremarkable. IMPRESSION: 1. No fracture or dislocation. 2. No evidence of loosening of the orthopedic hardware. Electronically Signed   By: Lajean Manes M.D.   On: 10/21/2017 07:24   Dg Hip Unilat  With Pelvis 2-3 Views Right  Result Date: 10/21/2017 CLINICAL DATA:  Pt c/o diffuse leg pain, worse at hip s/p slip, fall from bed (pt states she slipped from her satin sheets) this am. EXAM: DG HIP (WITH OR WITHOUT PELVIS) 2-3V RIGHT COMPARISON:  None. FINDINGS: No fracture.  No bone lesion. There is mild bilateral concentric hip joint space narrowing. No other arthropathic change. SI joints and symphysis pubis are normally aligned. Soft tissues are unremarkable. IMPRESSION: 1. No fracture or dislocation.  No acute finding. Electronically Signed   By: Lajean Manes M.D.   On: 10/21/2017 07:23    EKG: Independently reviewed.  Atrial fibrillation at 92 bpm  Assessment/Plan Acute respiratory failure with hypoxia 2/2 Community-acquired pneumonia:  Acute.  Patient presents febrile up to 100.9 F with mild tachypnea lactic acid was reassuring at 0.64.  Chest x-ray initially noted to give concern for pneumonia.  On the differential includes possibility of CHF. - Admit to telemetry bed - Follow-up blood/ sputum cultures and influenza screens - Continuous pulse oximetry with oxygen as needed - Give 125 mg of Solu-Medrol IV x1 dose, consider need of continuation or switch to p.o. in a.m. - Continue empiric antibiotics of ceftriaxone and azithromycin - Will add on Tamiflu if influenza screen positive - Tylenol prn fever  - Mucinex  Atrial fibrillation: New onset.  Patient reports having palpitations earlier last week.  EKG reveals signs of  atrial fibrillation, but appears rate controlled this time.  Patient has no previous history of atrial fibrillation in the past. - Strict intake and output a - Check daily weights - Check repeat EKG - Check TSH - Check echocardiogram  Generalized weakness: Patient reports being unable to ambulate after her nap due to weaknes most notably in her right leg.   - Normal saline IV fluids at 75 mL/h - PT to eval and treat  Essential hypertension - Continue home medications  Thrombocytopenia: Platelet count 139 on admission. - Continue to monitor  Hyperlipidemia - Continue simvastatin  Morbid obesity: BMI 46.96  DVT prophylaxis: Lovenox Code Status: Full Family Communication: Family present at bedside Disposition Plan: TBd Consults called: None Admission status: Inpatient  Norval Morton MD [facility] Pager [phone]   If 7PM-7AM, please contact night-coverage [email or web address] Password [facility]  10/21/2017, 11:26 PM

## 2017-10-21 NOTE — ED Provider Notes (Signed)
[facility] DEPT Provider Note   CSN: [id number] Arrival date & time: 10/21/17  2110     History   Chief Complaint Chief Complaint  Patient presents with  . Pneumonia    HPI Leah Velasquez is a 77 y.o. female.  The history is provided by the patient. No language interpreter was used.    Leah Velasquez is a 77 y.o. female who presents to the Emergency Department complaining of weakness.  She was seen earlier today and diagnosed with pneumonia.  She returns tonight because she is too weak to walk.  She was in the dining room and attempted to walk but became very weak and sat back down.  She was afraid she would have a second fall.  She did feel light headed and as if she might pass out.  A few days ago she developed sinus congestion with fever to 101.  She has associated cough, sore throat, diarrhea.  No vomiting, dysuria.  She lives at home with her husband who is unable to assist her.    Past Medical History:  Diagnosis Date  . Cancer ([place])    lung cancer  . Hypertension     There are no active problems to display for this patient.   Past Surgical History:  Procedure Laterality Date  . BREAST EXCISIONAL BIOPSY Left    x 2  . BREAST EXCISIONAL BIOPSY Right     OB History    No data available       Home Medications    Prior to Admission medications   Medication Sig Start Date End Date Taking? Authorizing Provider  azithromycin (ZITHROMAX) 250 MG tablet Take 250-500 mg by mouth daily. Take 500 mg on day 1 and 250 mg on days 2-5 10/20/17   [provider]  colchicine (COLCRYS) 0.6 MG tablet Take 0.6 mg by mouth every other day. 03/27/17   [provider]  dextromethorphan-guaiFENesin (MUCINEX DM) 30-600 MG 12hr tablet Take 1 tablet by mouth 2 (two) times daily as needed for cough.    [provider]  fluticasone (FLONASE) 50 MCG/ACT nasal spray Place 1 spray into both nostrils daily.  08/30/17   [provider]    furosemide (LASIX) 20 MG tablet Take 20 mg by mouth daily as needed for edema. 10/18/16   [provider]  levocetirizine (XYZAL) 5 MG tablet Take 5 mg by mouth daily. 09/22/17   [provider]  losartan (COZAAR) 100 MG tablet Take 100 mg by mouth daily. 08/04/17   [provider]  simvastatin (ZOCOR) 20 MG tablet Take 20 mg by mouth at bedtime. 07/17/17   [provider]  timolol (TIMOPTIC) 0.5 % ophthalmic solution Place 1 drop into both eyes 2 (two) times daily. 09/13/17   [provider]  traMADol (ULTRAM) 50 MG tablet Take 50 mg by mouth daily. 10/03/17   [provider]  zolpidem (AMBIEN) 5 MG tablet Take 5 mg by mouth at bedtime. 07/17/17   [provider]    Family History History reviewed. No pertinent family history.  Social History Social History   Tobacco Use  . Smoking status: Never Smoker  . Smokeless tobacco: Never Used  Substance Use Topics  . Alcohol use: No    Frequency: Never  . Drug use: No     Allergies   Morphine and Sulfa antibiotics   Review of Systems Review of Systems  All other systems reviewed and are negative.    Physical Exam  Updated Vital Signs BP (!) 151/70 (BP Location: Right Arm)   Pulse 91   Temp (!) 100.9 F (38.3 C) (Oral)   Resp 18   Ht 5\' 2"  (1.575 m)   Wt 113.4 kg (250 lb)   SpO2 95%   BMI 45.73 kg/m   Physical Exam  Constitutional: She is oriented to person, place, and time. She appears well-developed and well-nourished.  HENT:  Head: Normocephalic and atraumatic.  Cardiovascular: Regular rhythm.  No murmur heard. tachycardic  Pulmonary/Chest: Effort normal. No respiratory distress.  Diffuse rhonchi  Abdominal: Soft. There is no tenderness. There is no rebound and no guarding.  Musculoskeletal: She exhibits no tenderness.  Mild edema and tenderness to lateral aspect of left leg.   Neurological: She is alert and oriented to person, place, and time.   Generalized weakness, cannot sit up without assistance.    Skin: Skin is warm and dry.  Psychiatric: She has a normal mood and affect. Her behavior is normal.  Nursing note and vitals reviewed.    ED Treatments / Results  Labs (all labs ordered are listed, but only abnormal results are displayed) Labs Reviewed  COMPREHENSIVE METABOLIC PANEL  BRAIN NATRIURETIC PEPTIDE  INFLUENZA PANEL BY PCR (TYPE A & B)  I-STAT TROPONIN, ED  I-STAT CG4 LACTIC ACID, ED    EKG  EKG Interpretation  Date/Time:  Saturday October 21 2017 21:25:08 EST Ventricular Rate:  97 PR Interval:    QRS Duration: 80 QT Interval:  330 QTC Calculation: 420 R Axis:   57 Text Interpretation:  Atrial fibrillation Low voltage, precordial leads Abnormal R-wave progression, early transition Confirmed by Quintella Reichert [phone]) on 10/21/2017 11:29:53 PM       Radiology Dg Chest 2 View  Result Date: 10/21/2017 CLINICAL DATA:  Pt c/o diffuse back and leg pain, worse at hip s/p slip, fall from bed (pt states she slipped from her satin sheets) this am. EXAM: CHEST  2 VIEW COMPARISON:  None. FINDINGS: Cardiac silhouette is normal in size. No mediastinal or hilar masses. There is hazy opacity at the lung bases, right greater than left. On the right, the silhouettes the hemidiaphragm. This may reflect a combination of atelectasis with a small effusion on the right and atelectasis on the left. Pneumonia should be considered likely if there are consistent clinical symptoms. No convincing pulmonary edema. No pneumothorax. There are old healed right rib fractures. Skeletal structures are demineralized. IMPRESSION: 1. Right greater than left lung base opacity. This may reflect pneumonia. Alternatively, this may be due to atelectasis, with a possible pleural effusion on the right. Electronically Signed   By: Lajean Manes M.D.   On: 10/21/2017 07:26   Dg Knee Complete 4 Views Right  Result Date: 10/21/2017 CLINICAL DATA:  Pt c/o diffuse  back and leg pain, worse at hip s/p slip, fall from bed (pt states she slipped from her satin sheets) this am. EXAM: RIGHT KNEE - COMPLETE 4+ VIEW COMPARISON:  None. FINDINGS: No fracture.  No bone lesion. The knee prosthetic components appear well seated and well aligned. Soft tissues are unremarkable. IMPRESSION: 1. No fracture or dislocation. 2. No evidence of loosening of the orthopedic hardware. Electronically Signed   By: Lajean Manes M.D.   On: 10/21/2017 07:24   Dg Hip Unilat  With Pelvis 2-3 Views Right  Result Date: 10/21/2017 CLINICAL DATA:  Pt c/o diffuse leg pain, worse at hip s/p slip, fall from bed (pt states she slipped from her satin sheets) this  am. EXAM: DG HIP (WITH OR WITHOUT PELVIS) 2-3V RIGHT COMPARISON:  None. FINDINGS: No fracture.  No bone lesion. There is mild bilateral concentric hip joint space narrowing. No other arthropathic change. SI joints and symphysis pubis are normally aligned. Soft tissues are unremarkable. IMPRESSION: 1. No fracture or dislocation.  No acute finding. Electronically Signed   By: Lajean Manes M.D.   On: 10/21/2017 07:23    Procedures Procedures (including critical care time)  Medications Ordered in ED Medications - No data to display   Initial Impression / Assessment and Plan / ED Course  I have reviewed the triage vital signs and the nursing notes.  Pertinent labs & imaging results that were available during my care of the patient were reviewed by me and considered in my medical decision making (see chart for details).     Patient here for evaluation of weakness, diagnosed with pneumonia earlier today.  She has already received antibiotic treatment for pneumonia with Rocephin and azithromycin.  She does have diffuse rhonchi and occasional wheezing on examination with no respiratory distress.  She does have new oxygen requirement of 2 L.  Given weakness, new oxygen requirement medicine consulted for admission.  Final Clinical  Impressions(s) / ED Diagnoses   Final diagnoses:  Community acquired pneumonia of right lower lobe of lung [facility])    ED Discharge Orders    None       Quintella Reichert, MD 10/21/17 2331

## 2017-10-21 NOTE — ED Triage Notes (Signed)
Patient BIB [facility] from home. Patient was seen earlier today and dx with pneumonia, however pt states she cannot take care of herself at home. Pt husband also unable to care for pt. EMS reports initial o2 sat 85% on RA, warm to touch and some wheezing.

## 2017-10-21 NOTE — ED Notes (Signed)
purewick placed on patient to obtain urine sample

## 2017-10-21 NOTE — ED Provider Notes (Signed)
[facility] DEPT Provider Note   CSN: [id number] Arrival date & time: 10/21/17  5638     History   Chief Complaint Chief Complaint  Patient presents with  . Knee Pain    HPI Nerine Pulse is a 77 y.o. female.  Brought in by EMS for fall and knee pain.  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  Patient states that she was trying to get back into bed when her knee locked.  She had silky sheets and could not catch herself and she slid down to the ground with her leg caught in extension behind her at the hip.  She did call 911 and she was able unable to get up.  Patient was found to be febrile upon arrival.  She was seen by her PCP recently both for urinary tract infection which she completed a course of antibiotics.  She saw her PCP yesterday for a productive cough and body aches.  She was started on azithromycin but did not have a chest x-ray.  Patient continues to have some burning with urination.  She is taken 1 dose of her azithromycin yesterday.  HPI  History reviewed. No pertinent past medical history.  There are no active problems to display for this patient.   Past Surgical History:  Procedure Laterality Date  . BREAST EXCISIONAL BIOPSY Left    x 2  . BREAST EXCISIONAL BIOPSY Right     OB History    No data available       Home Medications    Prior to Admission medications   Not on File    Family History No family history on file.  Social History Social History   Tobacco Use  . Smoking status: Never Smoker  Substance Use Topics  . Alcohol use: No    Frequency: Never  . Drug use: No     Allergies   Sulfa antibiotics   Review of Systems Review of Systems  Ten systems reviewed and are negative for acute change, except as noted in the HPI.   Physical Exam Updated Vital Signs BP 139/83 (BP Location: Right Arm)   Pulse 91    Temp 99 F (37.2 C) (Oral)   Resp (!) 22   SpO2 97%   Physical Exam  Constitutional: She is oriented to person, place, and time. She appears well-developed and well-nourished. No distress.  HENT:  Head: Normocephalic and atraumatic.  Eyes: Conjunctivae are normal. No scleral icterus.  Neck: Normal range of motion.  Cardiovascular: Normal rate, regular rhythm and normal heart sounds. Exam reveals no gallop and no friction rub.  No murmur heard. Pulmonary/Chest: Effort normal. No respiratory distress. She has no rales.  Rhonchi that clear with cough  Abdominal: Soft. Bowel sounds are normal. She exhibits no distension and no mass. There is no tenderness. There is no guarding.  Musculoskeletal:  Normal passive range of motion of the hip and knee without significant pain.  Neurological: She is alert and oriented to person, place, and time.  Skin: Skin is warm and dry. She is not diaphoretic.  Psychiatric: Her behavior is normal.  Nursing note and vitals reviewed.    ED Treatments / Results  Labs (all labs ordered are listed, but only abnormal results are displayed) Labs Reviewed  CBC WITH DIFFERENTIAL/PLATELET - Abnormal; Notable for the following components:      Result Value   Platelets 139 (*)  Lymphs Abs 0.6 (*)    All other components within normal limits  BASIC METABOLIC PANEL - Abnormal; Notable for the following components:   Potassium 3.2 (*)    Glucose, Bld 126 (*)    Calcium 7.8 (*)    All other components within normal limits  CK  URINALYSIS, ROUTINE W REFLEX MICROSCOPIC    EKG  EKG Interpretation None       Radiology Dg Chest 2 View  Result Date: 10/21/2017 CLINICAL DATA:  Pt c/o diffuse back and leg pain, worse at hip s/p slip, fall from bed (pt states she slipped from her satin sheets) this am. EXAM: CHEST  2 VIEW COMPARISON:  None. FINDINGS: Cardiac silhouette is normal in size. No mediastinal or hilar masses. There is hazy opacity at the lung  bases, right greater than left. On the right, the silhouettes the hemidiaphragm. This may reflect a combination of atelectasis with a small effusion on the right and atelectasis on the left. Pneumonia should be considered likely if there are consistent clinical symptoms. No convincing pulmonary edema. No pneumothorax. There are old healed right rib fractures. Skeletal structures are demineralized. IMPRESSION: 1. Right greater than left lung base opacity. This may reflect pneumonia. Alternatively, this may be due to atelectasis, with a possible pleural effusion on the right. Electronically Signed   By: Lajean Manes M.D.   On: 10/21/2017 07:26   Dg Knee Complete 4 Views Right  Result Date: 10/21/2017 CLINICAL DATA:  Pt c/o diffuse back and leg pain, worse at hip s/p slip, fall from bed (pt states she slipped from her satin sheets) this am. EXAM: RIGHT KNEE - COMPLETE 4+ VIEW COMPARISON:  None. FINDINGS: No fracture.  No bone lesion. The knee prosthetic components appear well seated and well aligned. Soft tissues are unremarkable. IMPRESSION: 1. No fracture or dislocation. 2. No evidence of loosening of the orthopedic hardware. Electronically Signed   By: Lajean Manes M.D.   On: 10/21/2017 07:24   Dg Hip Unilat  With Pelvis 2-3 Views Right  Result Date: 10/21/2017 CLINICAL DATA:  Pt c/o diffuse leg pain, worse at hip s/p slip, fall from bed (pt states she slipped from her satin sheets) this am. EXAM: DG HIP (WITH OR WITHOUT PELVIS) 2-3V RIGHT COMPARISON:  None. FINDINGS: No fracture.  No bone lesion. There is mild bilateral concentric hip joint space narrowing. No other arthropathic change. SI joints and symphysis pubis are normally aligned. Soft tissues are unremarkable. IMPRESSION: 1. No fracture or dislocation.  No acute finding. Electronically Signed   By: Lajean Manes M.D.   On: 10/21/2017 07:23    Procedures Procedures (including critical care time)  Medications Ordered in ED Medications    acetaminophen (TYLENOL) tablet 1,000 mg (1,000 mg Oral Refused 10/21/17 0658)  cefTRIAXone (ROCEPHIN) 1 g in sodium chloride 0.9 % 100 mL IVPB (1 g Intravenous New Bag/Given 10/21/17 1194)     Initial Impression / Assessment and Plan / ED Course  I have reviewed the triage vital signs and the nursing notes.  Pertinent labs & imaging results that were available during my care of the patient were reviewed by me and considered in my medical decision making (see chart for details).     Patient with hip and knee x-rays.  Her lab workup shows no significant abnormality.  Chest x-ray does show some suggestion of potential atelectasis versus consolidation.  Given patient's fever and cough I suspect pneumonia.  She is currently taking azithromycin will cover with  Rocephin.  Patient states that she did have her urine checked 2 days ago and was negative.  Patient will be discharged on her current medications.  I discussed return precautions.  She is able to ambulate.  She appears appropriate for discharge at this time  Final Clinical Impressions(s) / ED Diagnoses   Final diagnoses:  None    ED Discharge Orders    None       Margarita Mail, PA-C 10/21/17 1558    Virgel Manifold, MD 10/23/17 1136

## 2017-10-21 NOTE — Discharge Instructions (Signed)
Contact a health care provider if: You have a fever. You are losing sleep because you cannot control your cough with cough medicine. Get help right away if: You have worsening shortness of breath. You have increased chest pain. Your sickness becomes worse, especially if you are an older adult or have a weakened immune system. You cough up blood.

## 2017-10-21 NOTE — ED Notes (Signed)
Bed: KM63 Expected date:  Expected time:  Means of arrival:  Comments: EMS 77 yo female-locked artificial knee-possible hib 123/94 Fentanyl dislocation

## 2017-10-21 NOTE — ED Notes (Addendum)
NT is going to room to help pt get dressed and assist in calling for a ride home. Pt has been discharged.

## 2017-10-21 NOTE — ED Triage Notes (Signed)
Pt arrives via EMS from home. Pt reported she was getting into bed and  she slipped out of bed onto the floor. On ems arrival, the pt was laying with her right leg bent back. She was c/o pain in the right hip and right knee. Hx of right artifical knee. IV was established and fentanyl 65mcg given en route.

## 2017-10-22 ENCOUNTER — Other Ambulatory Visit: Payer: Self-pay

## 2017-10-22 ENCOUNTER — Inpatient Hospital Stay (HOSPITAL_COMMUNITY): Payer: Medicare HMO

## 2017-10-22 DIAGNOSIS — I4819 Other persistent atrial fibrillation: Secondary | ICD-10-CM | POA: Diagnosis present

## 2017-10-22 DIAGNOSIS — I10 Essential (primary) hypertension: Secondary | ICD-10-CM | POA: Diagnosis present

## 2017-10-22 DIAGNOSIS — I4891 Unspecified atrial fibrillation: Secondary | ICD-10-CM | POA: Diagnosis present

## 2017-10-22 DIAGNOSIS — J181 Lobar pneumonia, unspecified organism: Secondary | ICD-10-CM | POA: Diagnosis present

## 2017-10-22 DIAGNOSIS — J440 Chronic obstructive pulmonary disease with acute lower respiratory infection: Secondary | ICD-10-CM | POA: Diagnosis present

## 2017-10-22 DIAGNOSIS — Z6841 Body Mass Index (BMI) 40.0 and over, adult: Secondary | ICD-10-CM | POA: Diagnosis not present

## 2017-10-22 DIAGNOSIS — J9601 Acute respiratory failure with hypoxia: Secondary | ICD-10-CM | POA: Diagnosis present

## 2017-10-22 DIAGNOSIS — J189 Pneumonia, unspecified organism: Secondary | ICD-10-CM | POA: Diagnosis present

## 2017-10-22 DIAGNOSIS — R531 Weakness: Secondary | ICD-10-CM

## 2017-10-22 DIAGNOSIS — D696 Thrombocytopenia, unspecified: Secondary | ICD-10-CM | POA: Diagnosis present

## 2017-10-22 DIAGNOSIS — Y92003 Bedroom of unspecified non-institutional (private) residence as the place of occurrence of the external cause: Secondary | ICD-10-CM | POA: Diagnosis not present

## 2017-10-22 DIAGNOSIS — W06XXXA Fall from bed, initial encounter: Secondary | ICD-10-CM | POA: Diagnosis present

## 2017-10-22 DIAGNOSIS — E785 Hyperlipidemia, unspecified: Secondary | ICD-10-CM | POA: Diagnosis present

## 2017-10-22 DIAGNOSIS — Z79891 Long term (current) use of opiate analgesic: Secondary | ICD-10-CM | POA: Diagnosis not present

## 2017-10-22 DIAGNOSIS — J441 Chronic obstructive pulmonary disease with (acute) exacerbation: Secondary | ICD-10-CM | POA: Diagnosis present

## 2017-10-22 DIAGNOSIS — Z885 Allergy status to narcotic agent status: Secondary | ICD-10-CM | POA: Diagnosis not present

## 2017-10-22 DIAGNOSIS — Z85118 Personal history of other malignant neoplasm of bronchus and lung: Secondary | ICD-10-CM | POA: Diagnosis not present

## 2017-10-22 DIAGNOSIS — J1000 Influenza due to other identified influenza virus with unspecified type of pneumonia: Secondary | ICD-10-CM | POA: Diagnosis present

## 2017-10-22 DIAGNOSIS — Z882 Allergy status to sulfonamides status: Secondary | ICD-10-CM | POA: Diagnosis not present

## 2017-10-22 DIAGNOSIS — A4189 Other specified sepsis: Secondary | ICD-10-CM | POA: Diagnosis present

## 2017-10-22 DIAGNOSIS — I481 Persistent atrial fibrillation: Secondary | ICD-10-CM | POA: Diagnosis present

## 2017-10-22 DIAGNOSIS — J9811 Atelectasis: Secondary | ICD-10-CM | POA: Diagnosis present

## 2017-10-22 DIAGNOSIS — Z79899 Other long term (current) drug therapy: Secondary | ICD-10-CM | POA: Diagnosis not present

## 2017-10-22 DIAGNOSIS — Z87891 Personal history of nicotine dependence: Secondary | ICD-10-CM | POA: Diagnosis not present

## 2017-10-22 DIAGNOSIS — M1120 Other chondrocalcinosis, unspecified site: Secondary | ICD-10-CM | POA: Diagnosis present

## 2017-10-22 DIAGNOSIS — J129 Viral pneumonia, unspecified: Secondary | ICD-10-CM | POA: Diagnosis present

## 2017-10-22 LAB — CBC
HCT: 39.8 % (ref 36.0–46.0)
Hemoglobin: 13 g/dL (ref 12.0–15.0)
MCH: 31.9 pg (ref 26.0–34.0)
MCHC: 32.7 g/dL (ref 30.0–36.0)
MCV: 97.8 fL (ref 78.0–100.0)
PLATELETS: 139 10*3/uL — AB (ref 150–400)
RBC: 4.07 MIL/uL (ref 3.87–5.11)
RDW: 14.3 % (ref 11.5–15.5)
WBC: 4.7 10*3/uL (ref 4.0–10.5)

## 2017-10-22 LAB — BASIC METABOLIC PANEL
Anion gap: 8 (ref 5–15)
BUN: 14 mg/dL (ref 6–20)
CALCIUM: 8.4 mg/dL — AB (ref 8.9–10.3)
CO2: 26 mmol/L (ref 22–32)
Chloride: 98 mmol/L — ABNORMAL LOW (ref 101–111)
Creatinine, Ser: 0.91 mg/dL (ref 0.44–1.00)
GFR calc Af Amer: >60 mL/min (ref >60)
GLUCOSE: 101 mg/dL — AB (ref 65–99)
Potassium: 3.8 mmol/L (ref 3.5–5.1)
Sodium: 132 mmol/L — ABNORMAL LOW (ref 135–145)

## 2017-10-22 LAB — TSH: TSH: 0.629 u[IU]/mL (ref 0.350–4.500)

## 2017-10-22 LAB — PROCALCITONIN: Procalcitonin: 0.24 ng/mL

## 2017-10-22 LAB — ECHOCARDIOGRAM COMPLETE
Height: 62 in
Weight: 4109.37 oz

## 2017-10-22 LAB — STREP PNEUMONIAE URINARY ANTIGEN: Strep Pneumo Urinary Antigen: NEGATIVE

## 2017-10-22 LAB — INFLUENZA PANEL BY PCR (TYPE A & B)
Influenza A By PCR: POSITIVE — AB
Influenza B By PCR: NEGATIVE

## 2017-10-22 LAB — BRAIN NATRIURETIC PEPTIDE: B NATRIURETIC PEPTIDE 5: 89.4 pg/mL (ref 0.0–100.0)

## 2017-10-22 MED ORDER — IPRATROPIUM-ALBUTEROL 0.5-2.5 (3) MG/3ML IN SOLN: 3.0000 mL | RESPIRATORY_TRACT | Status: DC | PRN

## 2017-10-22 MED ORDER — APIXABAN 5 MG PO TABS
5.0000 mg | ORAL_TABLET | Freq: Two times a day (BID) | ORAL | Status: DC
  Administered 2017-10-22 – 2017-10-26 (×8): 5 mg via ORAL
  Filled 2017-10-22 (×8): qty 1

## 2017-10-22 MED ORDER — CARVEDILOL 3.125 MG PO TABS
3.1250 mg | ORAL_TABLET | Freq: Two times a day (BID) | ORAL | Status: DC
  Administered 2017-10-22 – 2017-10-26 (×8): 3.125 mg via ORAL
  Filled 2017-10-22 (×8): qty 1

## 2017-10-22 MED ORDER — IPRATROPIUM-ALBUTEROL 0.5-2.5 (3) MG/3ML IN SOLN
3.0000 mL | Freq: Three times a day (TID) | RESPIRATORY_TRACT | Status: DC
  Administered 2017-10-22 – 2017-10-26 (×12): 3 mL via RESPIRATORY_TRACT
  Filled 2017-10-22 (×12): qty 3

## 2017-10-22 MED ORDER — PROMETHAZINE HCL 25 MG/ML IJ SOLN
12.5000 mg | Freq: Once | INTRAMUSCULAR | Status: AC
  Administered 2017-10-22: 12.5 mg via INTRAVENOUS
  Filled 2017-10-22: qty 1

## 2017-10-22 MED ORDER — TIMOLOL MALEATE 0.5 % OP SOLN
1.0000 [drp] | Freq: Two times a day (BID) | OPHTHALMIC | Status: DC
  Administered 2017-10-22 – 2017-10-26 (×9): 1 [drp] via OPHTHALMIC
  Filled 2017-10-22: qty 5

## 2017-10-22 MED ORDER — ACETAMINOPHEN 650 MG RE SUPP: 650.0000 mg | Freq: Four times a day (QID) | RECTAL | Status: DC | PRN

## 2017-10-22 MED ORDER — OSELTAMIVIR PHOSPHATE 75 MG PO CAPS: 75.0000 mg | ORAL_CAPSULE | Freq: Two times a day (BID) | ORAL | Status: DC

## 2017-10-22 MED ORDER — MAGNESIUM SULFATE 2 GM/50ML IV SOLN
2.0000 g | Freq: Once | INTRAVENOUS | Status: AC
  Administered 2017-10-22: 2 g via INTRAVENOUS
  Filled 2017-10-22: qty 50

## 2017-10-22 MED ORDER — SODIUM CHLORIDE 0.9 % IV SOLN
INTRAVENOUS | Status: DC
  Administered 2017-10-22: 22:00:00 via INTRAVENOUS

## 2017-10-22 MED ORDER — ZOLPIDEM TARTRATE 5 MG PO TABS
5.0000 mg | ORAL_TABLET | Freq: Every day | ORAL | Status: DC
  Administered 2017-10-22 – 2017-10-25 (×4): 5 mg via ORAL
  Filled 2017-10-22 (×5): qty 1

## 2017-10-22 MED ORDER — SIMVASTATIN 10 MG PO TABS
20.0000 mg | ORAL_TABLET | Freq: Every day | ORAL | Status: DC
  Administered 2017-10-22 – 2017-10-25 (×5): 20 mg via ORAL
  Filled 2017-10-22 (×5): qty 2

## 2017-10-22 MED ORDER — LORATADINE 10 MG PO TABS
10.0000 mg | ORAL_TABLET | Freq: Every day | ORAL | Status: DC
  Administered 2017-10-22 – 2017-10-26 (×5): 10 mg via ORAL
  Filled 2017-10-22 (×5): qty 1

## 2017-10-22 MED ORDER — FLUTICASONE PROPIONATE 50 MCG/ACT NA SUSP
1.0000 | Freq: Every day | NASAL | Status: DC
  Administered 2017-10-22 – 2017-10-26 (×4): 1 via NASAL
  Filled 2017-10-22: qty 16

## 2017-10-22 MED ORDER — FUROSEMIDE 20 MG PO TABS: 20.0000 mg | ORAL_TABLET | Freq: Every day | ORAL | Status: DC | PRN

## 2017-10-22 MED ORDER — LOSARTAN POTASSIUM 50 MG PO TABS
100.0000 mg | ORAL_TABLET | Freq: Every day | ORAL | Status: DC
  Administered 2017-10-22 – 2017-10-26 (×5): 100 mg via ORAL
  Filled 2017-10-22 (×5): qty 2

## 2017-10-22 MED ORDER — ACETAMINOPHEN 325 MG PO TABS: 650.0000 mg | ORAL_TABLET | Freq: Four times a day (QID) | ORAL | Status: DC | PRN

## 2017-10-22 MED ORDER — GUAIFENESIN ER 600 MG PO TB12
600.0000 mg | ORAL_TABLET | Freq: Two times a day (BID) | ORAL | Status: DC
  Administered 2017-10-22 – 2017-10-26 (×10): 600 mg via ORAL
  Filled 2017-10-22 (×10): qty 1

## 2017-10-22 MED ORDER — TRAMADOL HCL 50 MG PO TABS
50.0000 mg | ORAL_TABLET | Freq: Every day | ORAL | Status: DC | PRN
  Administered 2017-10-24 (×2): 50 mg via ORAL
  Filled 2017-10-22 (×4): qty 1

## 2017-10-22 MED ORDER — SODIUM CHLORIDE 0.9 % IV SOLN
1.0000 g | INTRAVENOUS | Status: DC
  Administered 2017-10-22 – 2017-10-24 (×3): 1 g via INTRAVENOUS
  Filled 2017-10-22 (×3): qty 1

## 2017-10-22 MED ORDER — METHYLPREDNISOLONE SODIUM SUCC 125 MG IJ SOLR
125.0000 mg | INTRAMUSCULAR | Status: AC
  Administered 2017-10-22: 125 mg via INTRAVENOUS
  Filled 2017-10-22: qty 2

## 2017-10-22 MED ORDER — IPRATROPIUM-ALBUTEROL 0.5-2.5 (3) MG/3ML IN SOLN
3.0000 mL | RESPIRATORY_TRACT | Status: AC
  Administered 2017-10-22: 3 mL via RESPIRATORY_TRACT
  Filled 2017-10-22: qty 3

## 2017-10-22 MED ORDER — ONDANSETRON HCL 4 MG PO TABS: 4.0000 mg | ORAL_TABLET | Freq: Four times a day (QID) | ORAL | Status: DC | PRN

## 2017-10-22 MED ORDER — ENOXAPARIN SODIUM 40 MG/0.4ML ~~LOC~~ SOLN
40.0000 mg | Freq: Every day | SUBCUTANEOUS | Status: DC
  Administered 2017-10-22: 40 mg via SUBCUTANEOUS
  Filled 2017-10-22: qty 0.4

## 2017-10-22 MED ORDER — OSELTAMIVIR PHOSPHATE 75 MG PO CAPS
75.0000 mg | ORAL_CAPSULE | Freq: Two times a day (BID) | ORAL | Status: AC
  Administered 2017-10-22 – 2017-10-26 (×10): 75 mg via ORAL
  Filled 2017-10-22 (×10): qty 1

## 2017-10-22 MED ORDER — ONDANSETRON HCL 4 MG/2ML IJ SOLN
4.0000 mg | Freq: Four times a day (QID) | INTRAMUSCULAR | Status: DC | PRN
  Administered 2017-10-22 – 2017-10-23 (×3): 4 mg via INTRAVENOUS
  Filled 2017-10-22 (×3): qty 2

## 2017-10-22 MED ORDER — SODIUM CHLORIDE 0.9 % IV SOLN
500.0000 mg | Freq: Every day | INTRAVENOUS | Status: DC
  Administered 2017-10-22 – 2017-10-24 (×3): 500 mg via INTRAVENOUS
  Filled 2017-10-22 (×3): qty 500

## 2017-10-22 MED ORDER — COLCHICINE 0.6 MG PO TABS
0.6000 mg | ORAL_TABLET | ORAL | Status: DC
  Administered 2017-10-22 – 2017-10-26 (×3): 0.6 mg via ORAL
  Filled 2017-10-22 (×3): qty 1

## 2017-10-22 NOTE — Progress Notes (Signed)
  Echocardiogram 2D Echocardiogram has been performed.  Merrie Roof F 10/22/2017, 3:16 PM

## 2017-10-22 NOTE — Progress Notes (Signed)
[facility] for Apixaban Indication: atrial fibrillation  Allergies  Allergen Reactions  . Morphine Anaphylaxis  . Sulfa Antibiotics Rash   Patient Measurements: Height: 5\' 2"  (157.5 cm) Weight: 256 lb 13.4 oz (116.5 kg) IBW/kg (Calculated) : 50.1  Vital Signs: Temp: 98.1 F (36.7 C) (03/03 1344) Temp Source: Oral (03/03 1344) BP: 132/78 (03/03 1344) Pulse Rate: 75 (03/03 1344)  Labs: Recent Labs    10/21/17 0647 10/21/17 2239 10/22/17 0540  HGB 12.9  --  13.0  HCT 38.7  --  39.8  PLT 139*  --  139*  CREATININE 0.73 0.96 0.91  CKTOTAL 41  --   --    Estimated Creatinine Clearance: 65.7 mL/min (by C-G formula based on SCr of 0.91 mg/dL).  Medical History: Past Medical History:  Diagnosis Date  . Cancer ([place])    lung cancer  . Hypertension    Medications:  Scheduled:  . apixaban  5 mg Oral BID  . carvedilol  3.125 mg Oral BID WC  . colchicine  0.6 mg Oral QODAY  . fluticasone  1 spray Each Nare Daily  . guaiFENesin  600 mg Oral BID  . ipratropium-albuterol  3 mL Nebulization TID  . loratadine  10 mg Oral Daily  . losartan  100 mg Oral Daily  . oseltamivir  75 mg Oral BID  . simvastatin  20 mg Oral QHS  . timolol  1 drop Both Eyes BID  . zolpidem  5 mg Oral QHS    Assessment: Apixaban 5mg  bid, ordered by MD for Afib Pharmacy will counsel   Goal of Therapy:  Monitor platelets by anticoagulation protocol: Yes   Plan:  Apixaban 5mg  bid Monitor CBC, platelets  Peri Kreft L 10/22/2017,8:25 PM

## 2017-10-22 NOTE — Research (Signed)
Title: A Randomized, Double-Blind, Placebo-Controlled Dose Ranging Study Evaluating the Safety Pharmacokinetics and Clinical Benefit of FLU-IGIV in Hospitalized Patients with Serious Influenza A infection. IA-001 (ClinicalTrials.gov Identifier: [id number], Protocol No: IA-001, [facility] Protocol #[id number])  RESEARCH SUBJECT. This research study is sponsored by [employer]   Protocol:  - amendment : #4 -> 12 Jan 2017   -  Administrative Change Memo - 03/JAN/2019  - Investigator Bronchure: 4.0 - 12 Jan 2017 - double checked to be correct YES  The investigational product is called NP-025 aka FLU-IGIV or anti-influenza immune globulin intravenous. It is produced from source plasma collected from [place] ([place]) Transport planner (FDA) licensed plasma collection establishments from healthy donors who have recovered from influenza (convalescent) and/or were vaccinated against seasonal influenza strains. The plasma contains a relatively high concentration of polyclonal antibodies directed against seasonal influenza strains, specifically influenza A strains H1N1 ([place]) and H3N2 ([place]). It is a glycoprotein of 150-160 kilodaltons against Hemagluttinin (HA) and Neuraminidase (NA) surface proteins.   Key Inclusion Criteria: Adult patients; locally determined positive influenza A infection (Rapid Antigen Test or PCR) from a specimen obtained within 2 days prior to randomization; onset of symptoms </= 6 days prior to randomization; experiencing >/= 1 respiratory symptom (cough, sore throat, nasal congestion) and >/= 1 constitutional symptom (headache, myalgia, feverishness or fatigue); NEW score >/=3 at screening; must have an active order for a minimum 5 day course of oseltamivir (75mg /twice daily).  Key Exclusion Criteria: History of hypersensitivity to blood or plasma products; history of allergy to latex or rubber; pregnancy or lactation; known IgA deficiency;  medical conditions for which receipt of a 500 mL volume of IV fluid may be dangerous; a pre-existing condition or use of medication that may place the individual at a substantially increased risk of thrombosis; anticipated life expectancy < 90 days; confirmed bacterial pneumonia or any concurrent respiratory viral infection that is not influenza A.  Key Randomization Information: Patients will be randomized to receive a single IV administration of FLU-IGIV (NP-025) or placebo (normal saline 0.9% NaCL). Two dose levels are being assessed, each a fixed dose by volume - 10 vials (450 mL) for the high dose and 5 vials (225 mL) for the low dose. Assuming a patient weight range of 60 kg to 100 kg, patients will receive approximately 0.32 - 0.53 g/kg of IgG protein for the high dose and approximately 0.16 - 0.26 g/kg for the low dose, in a single infusion.  Key other data: Side effects with infusion  Incidence Occurrence Side effect  Common 1-15%, typically < 5% First 30 minutes of infusion Back pain, abdominal pain, nausea, vomiting  Common 1-15%, typically < 5% First few minutes to several hours after infusion Chills, fever, headache, myalgia, fatigue  Rare Seconds to several hours after infusion Hypersensitivity reaction (happens with IgA deficiency), flushing, facial swelling, dyspnea, cyanosis, shock, cardiac arrest, pneumonitis, renal failure, aseptic meningitis, hemolysis, viral infection  Rare Seconds to several hours after infusion Thrombosis (at risk patients are those with a history of DVT, arterial thrombosis, multiple cardiovascular risk factors, impaired cardiac output, advanced age, coagulation disorders, prolonged immobilization, use of estrogen, indwelling catheters, known/suspected hyperviscosity)  Recommendation: For patients who are at risk of developing thrombotic events, administer NP-025 at the minimum rate of infusion practicable, not to exceed 2 mL/min. Ensure adequate hydration in  patients before administration. Monitor  Rare 1-6 hours after infusion Transfusion related Acute Lung Injury (TRALI)  Rare  Hemolysis (dose related,  happens in total > 2g/kg and non-O blood group), severe hemolysis may lead to renal dysfunction/failure.  Recommendation: Check hemoglobin pre- and post 36-96h and 7-10 days transfusion  Rare Several hours to two  days after infusion Aseptic Meningitis (AMS) (dose related, happens in total > 2g/kg)   NOTE:  1) NP-025 contains 10% maltose - will interfere with glucose measurements if non-glucose specific measurement devices are not used. Results in false high glucose levels can  result in increased insulin -> life threatening hypoglycemia  2) DSMB based IB version 2.0 12 Jan 2017 - first 20 subjects on this study - no safety signals identified  ................................................................................................... Clinical Research Coordinator / Research RN note : This visit for Subject Leah Velasquez with DOB: August 26, 1940 on 10/22/2017 for the above protocol is approached for pre-consenting for the flu study. I briefly went over the informed consent form (ICF) with the patient. Patient is interested in reading the consent form. Gave a copy of the consent form for her to read. But patient said that she can not read the inform consent form because she does not have reading glasses. Explained her the time sensitivity of the flu study. Her spouse would not be available today to go over the inform consent. Asked the patient to call study team, pointed out the study team phone number on the ICF. Also, let her know that study doctor- Dr. Chase Caller might as well visit her to talk about the flu study. Patient verbally agreed to read ICF whenever she is able to read it and give [place] a call to indicate her interest to participate or not in the study.   Signed by  [place] Coordinator [facility]  [place], [place] 12:02  PM 10/22/2017

## 2017-10-22 NOTE — ED Notes (Signed)
ED TO INPATIENT HANDOFF REPORT  Name/Age/Gender Leah Velasquez 77 y.o. female  Code Status    Code Status Orders  (From admission, onward)        Start     Ordered   10/22/17 0057  Full code  Continuous     10/22/17 0104    Code Status History    Date Active Date Inactive Code Status Order ID Comments User Context   This patient has a current code status but no historical code status.      Home/SNF/Other Home  Chief Complaint pneumonia  Level of Care/Admitting Diagnosis ED Disposition    ED Disposition Condition Comment   Admit  Hospital Area: [facility] [[id number]]  Level of Care: Telemetry [5]  Admit to tele based on following criteria: Complex arrhythmia (Bradycardia/Tachycardia)  Diagnosis: Acute respiratory failure with hypoxia [facility]) [[id number]]  Admitting Physician: Norval Morton [[id number]]  Attending Physician: Norval Morton [[id number]]  Estimated length of stay: past midnight tomorrow  Certification:: I certify this patient will need inpatient services for at least 2 midnights  PT Class (Do Not Modify): Inpatient [101]  PT Acc Code (Do Not Modify): Private [1]       Medical History Past Medical History:  Diagnosis Date  . Cancer ([place])    lung cancer  . Hypertension     Allergies Allergies  Allergen Reactions  . Morphine Anaphylaxis  . Sulfa Antibiotics Rash    IV Location/Drains/Wounds Patient Lines/Drains/Airways Status   Active Line/Drains/Airways    Name:   Placement date:   Placement time:   Site:   Days:   Peripheral IV 10/22/17 Left Wrist   10/22/17    0123    Wrist   less than 1          Labs/Imaging Results for orders placed or performed during the hospital encounter of 10/21/17 (from the past 48 hour(s))  Comprehensive metabolic panel     Status: Abnormal   Collection Time: 10/21/17 10:39 PM  Result Value Ref Range   Sodium 132 (L) 135 - 145 mmol/L   Potassium 4.1 3.5 - 5.1 mmol/L    Comment: DELTA CHECK  NOTED REPEATED TO VERIFY NO VISIBLE HEMOLYSIS    Chloride 100 (L) 101 - 111 mmol/L   CO2 25 22 - 32 mmol/L   Glucose, Bld 115 (H) 65 - 99 mg/dL   BUN 15 6 - 20 mg/dL   Creatinine, Ser 0.96 0.44 - 1.00 mg/dL   Calcium 8.7 (L) 8.9 - 10.3 mg/dL   Total Protein 6.8 6.5 - 8.1 g/dL   Albumin 3.2 (L) 3.5 - 5.0 g/dL   AST 48 (H) 15 - 41 U/L   ALT 38 14 - 54 U/L   Alkaline Phosphatase 83 38 - 126 U/L   Total Bilirubin 0.7 0.3 - 1.2 mg/dL   GFR calc non Af Amer 57 (L) >60 mL/min   GFR calc Af Amer >60 >60 mL/min    Comment: (NOTE) The eGFR has been calculated using the CKD EPI equation. This calculation has not been validated in all clinical situations. eGFR's persistently <60 mL/min signify possible Chronic Kidney Disease.    Anion gap 7 5 - 15    Comment: Performed at [facility], [place] [address] [place], [place] [address]  Brain natriuretic peptide     Status: None   Collection Time: 10/21/17 10:39 PM  Result Value Ref Range   B Natriuretic Peptide 89.4 0.0 - 100.0 pg/mL  Comment: Performed at [facility], [place] [address], [place], [place] [address]  I-stat troponin, ED     Status: None   Collection Time: 10/21/17 10:52 PM  Result Value Ref Range   Troponin i, poc 0.01 0.00 - 0.08 ng/mL   Comment 3            Comment: Due to the release kinetics of cTnI, a negative result within the first hours of the onset of symptoms does not rule out myocardial infarction with certainty. If myocardial infarction is still suspected, repeat the test at appropriate intervals.   I-Stat CG4 Lactic Acid, ED     Status: None   Collection Time: 10/21/17 10:53 PM  Result Value Ref Range   Lactic Acid, Venous 0.64 0.5 - 1.9 mmol/L   Dg Chest 2 View  Result Date: 10/21/2017 CLINICAL DATA:  Pt c/o diffuse back and leg pain, worse at hip s/p slip, fall from bed (pt states she slipped from her satin sheets) this am. EXAM: CHEST  2 VIEW COMPARISON:  None.  FINDINGS: Cardiac silhouette is normal in size. No mediastinal or hilar masses. There is hazy opacity at the lung bases, right greater than left. On the right, the silhouettes the hemidiaphragm. This may reflect a combination of atelectasis with a small effusion on the right and atelectasis on the left. Pneumonia should be considered likely if there are consistent clinical symptoms. No convincing pulmonary edema. No pneumothorax. There are old healed right rib fractures. Skeletal structures are demineralized. IMPRESSION: 1. Right greater than left lung base opacity. This may reflect pneumonia. Alternatively, this may be due to atelectasis, with a possible pleural effusion on the right. Electronically Signed   By: Lajean Manes M.D.   On: 10/21/2017 07:26   Dg Knee Complete 4 Views Right  Result Date: 10/21/2017 CLINICAL DATA:  Pt c/o diffuse back and leg pain, worse at hip s/p slip, fall from bed (pt states she slipped from her satin sheets) this am. EXAM: RIGHT KNEE - COMPLETE 4+ VIEW COMPARISON:  None. FINDINGS: No fracture.  No bone lesion. The knee prosthetic components appear well seated and well aligned. Soft tissues are unremarkable. IMPRESSION: 1. No fracture or dislocation. 2. No evidence of loosening of the orthopedic hardware. Electronically Signed   By: Lajean Manes M.D.   On: 10/21/2017 07:24   Dg Hip Unilat  With Pelvis 2-3 Views Right  Result Date: 10/21/2017 CLINICAL DATA:  Pt c/o diffuse leg pain, worse at hip s/p slip, fall from bed (pt states she slipped from her satin sheets) this am. EXAM: DG HIP (WITH OR WITHOUT PELVIS) 2-3V RIGHT COMPARISON:  None. FINDINGS: No fracture.  No bone lesion. There is mild bilateral concentric hip joint space narrowing. No other arthropathic change. SI joints and symphysis pubis are normally aligned. Soft tissues are unremarkable. IMPRESSION: 1. No fracture or dislocation.  No acute finding. Electronically Signed   By: Lajean Manes M.D.   On: 10/21/2017  07:23    Pending Labs Unresulted Labs (From admission, onward)   Start     Ordered   10/22/17 0500  CBC  Tomorrow morning,   R     10/22/17 0104   10/22/17 6811  Basic metabolic panel  Tomorrow morning,   R     10/22/17 0104   10/22/17 0105  Procalcitonin  Add-on,   R     10/22/17 0104   10/22/17 0101  Culture, sputum-assessment  Once,   R  10/22/17 0104   10/22/17 0101  Legionella Pneumophila Serogp 1 Ur Ag  Once,   R     10/22/17 0104   10/22/17 0056  Gram stain  Once,   R     10/22/17 0104   10/22/17 0056  Strep pneumoniae urinary antigen  Once,   R     10/22/17 0104   10/21/17 2217  Influenza panel by PCR (type A & B)  (Influenza PCR Panel)  Once,   R     10/21/17 2216      Vitals/Pain Today's Vitals   10/21/17 2140 10/21/17 2200 10/21/17 2236 10/22/17 0000  BP:  (!) 151/70 (!) 151/70 136/69  Pulse:  90 91 89  Resp:  (!) 26 18 20   Temp:      TempSrc:      SpO2:  94% 95% 96%  Weight: 250 lb (113.4 kg)     Height: 5' 2"  (1.575 m)     PainSc: 2        Isolation Precautions Droplet precaution  Medications Medications  furosemide (LASIX) tablet 20 mg (not administered)  simvastatin (ZOCOR) tablet 20 mg (not administered)  losartan (COZAAR) tablet 100 mg (not administered)  levocetirizine (XYZAL) tablet 5 mg (not administered)  fluticasone (FLONASE) 50 MCG/ACT nasal spray 1 spray (not administered)  guaiFENesin (MUCINEX) 12 hr tablet 600 mg (not administered)  colchicine tablet 0.6 mg (not administered)  timolol (TIMOPTIC) 0.5 % ophthalmic solution 1 drop (not administered)  traMADol (ULTRAM) tablet 50 mg (not administered)  zolpidem (AMBIEN) tablet 5 mg (not administered)  enoxaparin (LOVENOX) injection 40 mg (not administered)  acetaminophen (TYLENOL) tablet 650 mg (not administered)    Or  acetaminophen (TYLENOL) suppository 650 mg (not administered)  ondansetron (ZOFRAN) tablet 4 mg (not administered)    Or  ondansetron (ZOFRAN) injection 4 mg (not  administered)  ipratropium-albuterol (DUONEB) 0.5-2.5 (3) MG/3ML nebulizer solution 3 mL (not administered)  methylPREDNISolone sodium succinate (SOLU-MEDROL) 125 mg/2 mL injection 125 mg (not administered)  ipratropium-albuterol (DUONEB) 0.5-2.5 (3) MG/3ML nebulizer solution 3 mL (not administered)  cefTRIAXone (ROCEPHIN) 1 g in sodium chloride 0.9 % 100 mL IVPB (not administered)  azithromycin (ZITHROMAX) 500 mg in sodium chloride 0.9 % 250 mL IVPB (not administered)    Mobility walks with person assist

## 2017-10-22 NOTE — Progress Notes (Signed)
PROGRESS NOTE    Leah Velasquez  [id number]:[id number] DOB: 12-Apr-1941 DOA: 10/21/2017 PCP: [name], MD    Brief Narrative:  77 year old female who presented with weakness.  She does have significant past medical history of lung cancer, hypertension and morbid obesity.  Patient reported not feeling well for the last 3 days prior to hospitalization, with productive cough, generalized weakness, palpitations, diarrhea and fever.  On initial physical examination her temperature was 100.9 F, heart rate 76-92, respirations 18-22, blood pressure 106/55, oxygen saturation 94-99% on 2 L nasal cannula.  Moist mucous membranes, lungs clear to auscultation bilaterally, no wheezing, rales or rhonchi, heart S1-S2 present rhythmic, the abdomen was soft nontender, no lower extremity edema.  Sodium 132, potassium 4.1, chloride 100, bicarb 25, glucose 115, BUN 15, creatinine 0.96, white count 5.7, hemoglobin 12.9, hematocrit 38.7, platelets 139, procalcitonin 0.24.  Influenza A+.  Chest x-ray showed right rotation, right base opacity, underpenetration due to soft tissue.  EKG 97 bpm, atrial fibrillation, normal axis.  Patient was admitted to the hospital working diagnosis of sepsis due to influenza A complicated by acute hypoxic respiratory failure and community-acquired pneumonia.    Assessment & Plan:   Principal Problem:   Acute respiratory failure with hypoxia (HCC) Active Problems:   CAP (community acquired pneumonia)   Atrial fibrillation (HCC)   Generalized weakness   Essential hypertension   Thrombocytopenia (HCC)   Hyperlipidemia   1.  Acute hypoxic respiratory failure due to influenza A, complicated with community acquired pneumonia. Will continue oxymetry monitoring, supplemental 02 per [place], target 02 saturation greater than 92%. Bronchodilator therapy. Antibiotic therapy with ceftriaxone and azithromycin. Oseltamivir. Will continue to follow on cultures, cell count and temperature curve. Low  pro-calcitonin, will plan a prompt de-escalation of antibiotic therapy.   2.  Atrial fibrillation. NEW. Continue to be rate controlled, will follow on echocardiogram. Telemetry personally reviewed, with persistent a fib. Will repeat 12 lead ekg. ChadsVasc score 3, Has bled score 2, with moderate risk of bleeding. Start apixaban.  3.  Hypertension. Continue blood pressure control with losartan, systolic blood pressure 834 to 130.     4.  Dyslipidemia with morbid obesity, BMI 46.9. Will continue statin therapy, physical therapy evaluation.    DVT prophylaxis: apixaban  Code Status:  full Family Communication:  No family at the bedside Disposition Plan: home   Consultants:     Procedures:     Antimicrobials:   Ceftriaxone  Azithromycin  Oseltamivir    Subjective: Patient not feeling well in general, no nausea or vomiting, dyspnea has been improving, positive dry cough. 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   Objective: Vitals:   10/22/17 0200 10/22/17 0230 10/22/17 0258 10/22/17 0614  BP: 130/83 123/78  117/69  Pulse: 79 74  71  Resp: 20   18  Temp:  98.5 F (36.9 C)  99 F (37.2 C)  TempSrc:  Oral  Oral  SpO2: 97% 99% 98% 97%  Weight:  116.5 kg (256 lb 13.4 oz)    Height:  5\' 2"  (1.575 m)      Intake/Output Summary (Last 24 hours) at 10/22/2017 1155 Last data filed at 10/22/2017 1033 Gross per 24 hour  Intake 480 ml  Output -  Net 480 ml   Filed Weights   10/21/17 2127 10/21/17 2140 10/22/17 0230  Weight: 113.4 kg (250 lb) 113.4 kg (250 lb) 116.5 kg (256 lb 13.4 oz)    Examination:   General: Not  in pain or dyspnea, deconditioned Neurology: Awake and alert, non focal  E ENT: mild pallor, no icterus, oral mucosa moist Cardiovascular: No JVD. S1-S2 present, rhythmic, no gallops, rubs, or murmurs. No lower extremity edema. Pulmonary: decreased breath sounds bilaterally, poor air movement, no wheezing, positive scattered bilateral  rhonchi or rales. Gastrointestinal. Abdomen protuberant no organomegaly, non tender, no rebound or guarding Skin. No rashes Musculoskeletal: no joint deformities     Data Reviewed: I have personally reviewed following labs and imaging studies  CBC: Recent Labs  Lab 10/21/17 0647 10/22/17 0540  WBC 5.7 4.7  NEUTROABS 4.8  --   HGB 12.9 13.0  HCT 38.7 39.8  MCV 96.3 97.8  PLT 139* 127*   Basic Metabolic Panel: Recent Labs  Lab 10/21/17 0647 10/21/17 2239 10/22/17 0540  NA 135 132* 132*  K 3.2* 4.1 3.8  CL 104 100* 98*  CO2 24 25 26   GLUCOSE 126* 115* 101*  BUN 14 15 14   CREATININE 0.73 0.96 0.91  CALCIUM 7.8* 8.7* 8.4*   GFR: Estimated Creatinine Clearance: 65.7 mL/min (by C-G formula based on SCr of 0.91 mg/dL). Liver Function Tests: Recent Labs  Lab 10/21/17 2239  AST 48*  ALT 38  ALKPHOS 83  BILITOT 0.7  PROT 6.8  ALBUMIN 3.2*   No results for input(s): LIPASE, AMYLASE in the last 168 hours. No results for input(s): AMMONIA in the last 168 hours. Coagulation Profile: No results for input(s): INR, PROTIME in the last 168 hours. Cardiac Enzymes: Recent Labs  Lab 10/21/17 0647  CKTOTAL 41   BNP (last 3 results) No results for input(s): PROBNP in the last 8760 hours. HbA1C: No results for input(s): HGBA1C in the last 72 hours. CBG: No results for input(s): GLUCAP in the last 168 hours. Lipid Profile: No results for input(s): CHOL, HDL, LDLCALC, TRIG, CHOLHDL, LDLDIRECT in the last 72 hours. Thyroid Function Tests: Recent Labs    10/22/17 0651  TSH 0.629   Anemia Panel: No results for input(s): VITAMINB12, FOLATE, FERRITIN, TIBC, IRON, RETICCTPCT in the last 72 hours.    Radiology Studies: I have reviewed all of the imaging during this hospital visit personally     Scheduled Meds: . colchicine  0.6 mg Oral QODAY  . enoxaparin (LOVENOX) injection  40 mg Subcutaneous QHS  . fluticasone  1 spray Each Nare Daily  . guaiFENesin  600 mg  Oral BID  . ipratropium-albuterol  3 mL Nebulization TID  . loratadine  10 mg Oral Daily  . losartan  100 mg Oral Daily  . oseltamivir  75 mg Oral BID  . simvastatin  20 mg Oral QHS  . timolol  1 drop Both Eyes BID  . zolpidem  5 mg Oral QHS   Continuous Infusions: . sodium chloride 75 mL/hr at 10/22/17 0714  . azithromycin 500 mg (10/22/17 0536)  . cefTRIAXone (ROCEPHIN)  IV Stopped (10/22/17 0845)     LOS: 0 days        Mauricio Gerome Apley, MD [facility] Pager [phone]

## 2017-10-23 ENCOUNTER — Inpatient Hospital Stay (HOSPITAL_COMMUNITY): Payer: Medicare HMO

## 2017-10-23 LAB — BASIC METABOLIC PANEL
ANION GAP: 7 (ref 5–15)
BUN: 17 mg/dL (ref 6–20)
CO2: 28 mmol/L (ref 22–32)
Calcium: 8.6 mg/dL — ABNORMAL LOW (ref 8.9–10.3)
Chloride: 101 mmol/L (ref 101–111)
Creatinine, Ser: 0.85 mg/dL (ref 0.44–1.00)
GFR calc Af Amer: >60 mL/min (ref >60)
GFR calc non Af Amer: >60 mL/min (ref >60)
GLUCOSE: 113 mg/dL — AB (ref 65–99)
POTASSIUM: 3.9 mmol/L (ref 3.5–5.1)
SODIUM: 136 mmol/L (ref 135–145)

## 2017-10-23 LAB — CBC WITH DIFFERENTIAL/PLATELET
BASOS ABS: 0 10*3/uL (ref 0.0–0.1)
Basophils Relative: 0 %
EOS PCT: 0 %
Eosinophils Absolute: 0 10*3/uL (ref 0.0–0.7)
HCT: 40.4 % (ref 36.0–46.0)
Hemoglobin: 13.1 g/dL (ref 12.0–15.0)
LYMPHS PCT: 13 %
Lymphs Abs: 0.9 10*3/uL (ref 0.7–4.0)
MCH: 31.6 pg (ref 26.0–34.0)
MCHC: 32.4 g/dL (ref 30.0–36.0)
MCV: 97.3 fL (ref 78.0–100.0)
Monocytes Absolute: 0.8 10*3/uL (ref 0.1–1.0)
Monocytes Relative: 11 %
NEUTROS PCT: 76 %
Neutro Abs: 5.4 10*3/uL (ref 1.7–7.7)
PLATELETS: 143 10*3/uL — AB (ref 150–400)
RBC: 4.15 MIL/uL (ref 3.87–5.11)
RDW: 14 % (ref 11.5–15.5)
WBC: 7.1 10*3/uL (ref 4.0–10.5)

## 2017-10-23 LAB — LEGIONELLA PNEUMOPHILA SEROGP 1 UR AG: L. PNEUMOPHILA SEROGP 1 UR AG: NEGATIVE

## 2017-10-23 IMAGING — DX DG CHEST 2V
2 series · 2 of 2 positions shown · non-contrast
Comparison: Chest x-ray dated [DATE].

CLINICAL DATA: Weakness. History of lung cancer, hypertension and
morbid obesity. Productive cough, palpitations, diarrhea and fever.

EXAM:
CHEST  2 VIEW

[chest lat]
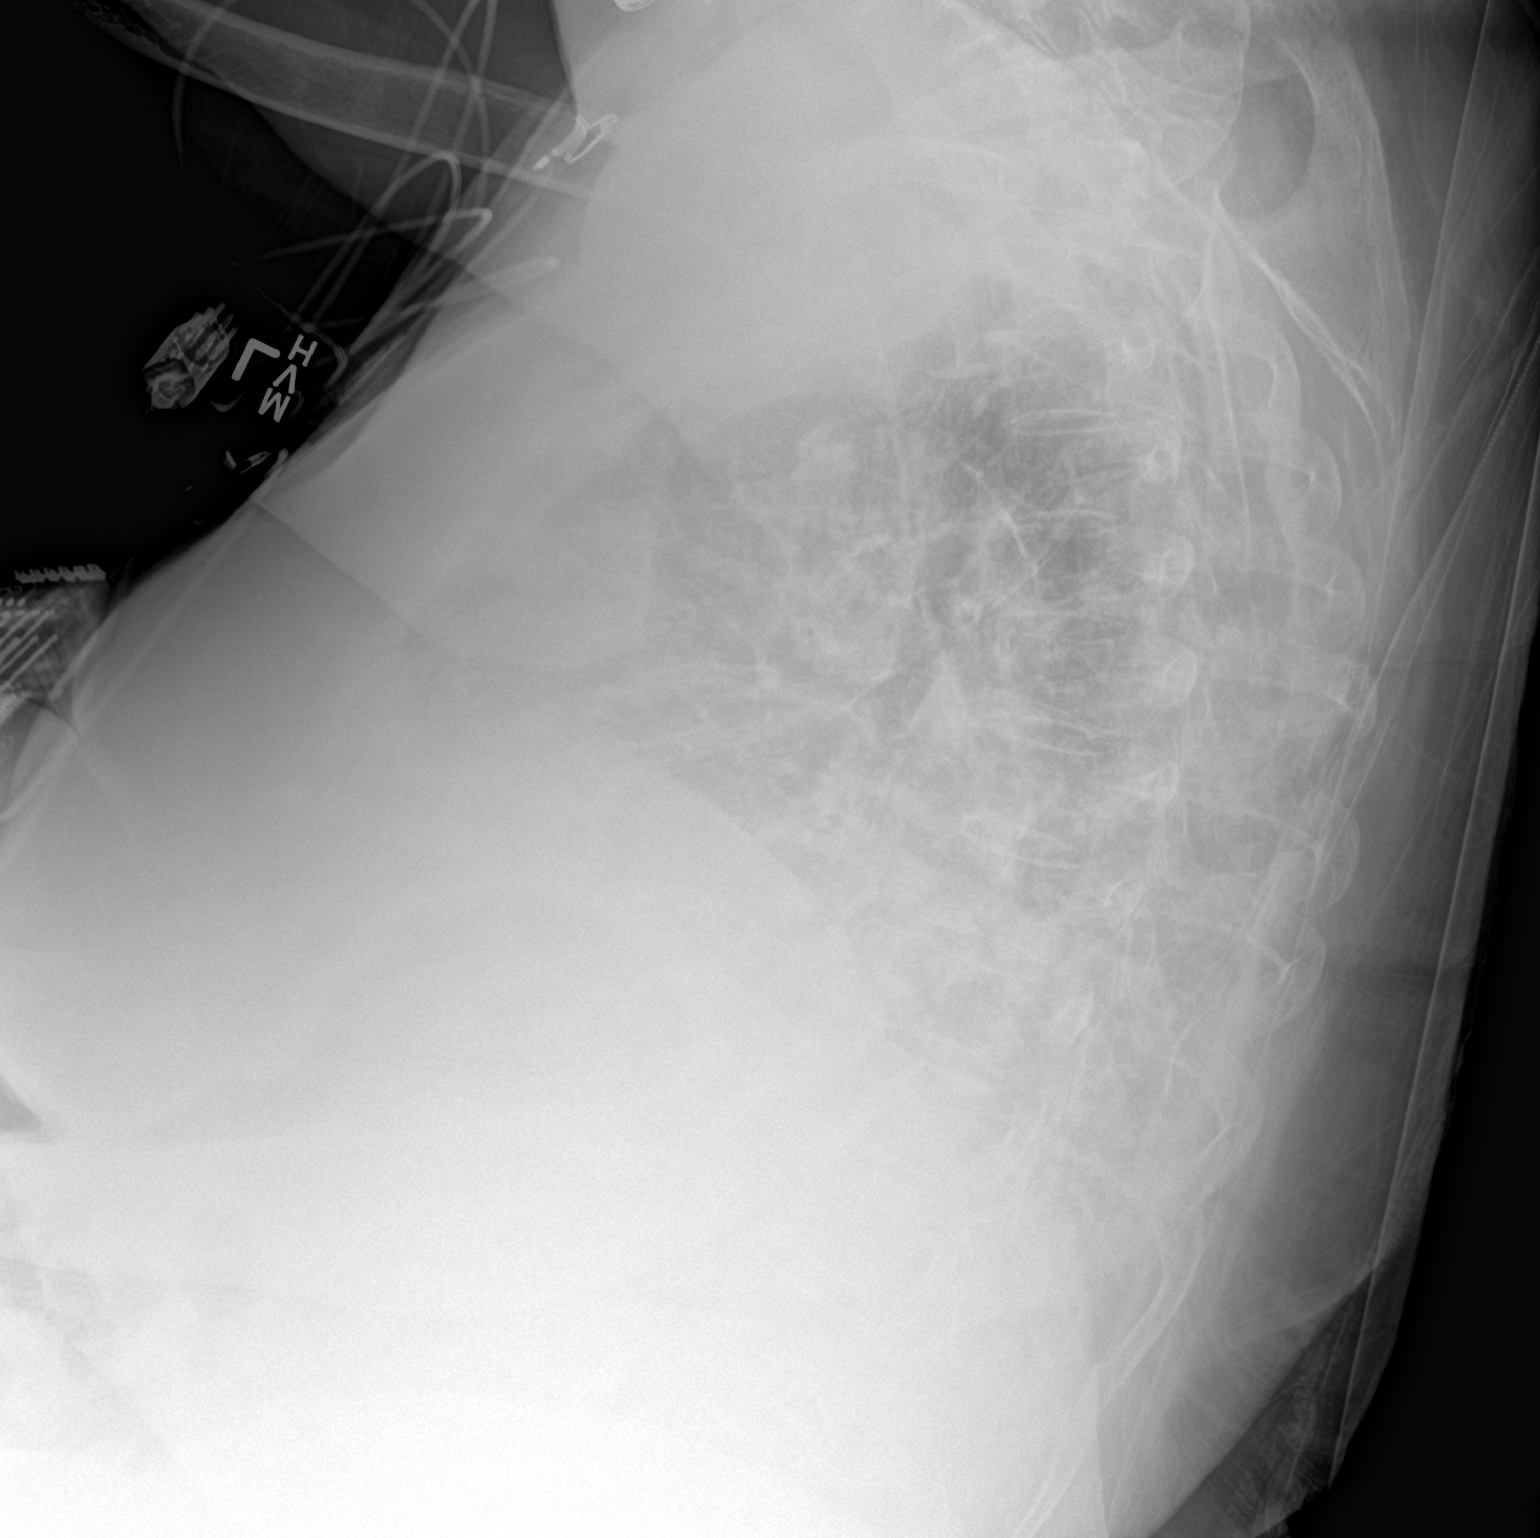

[chest ap]
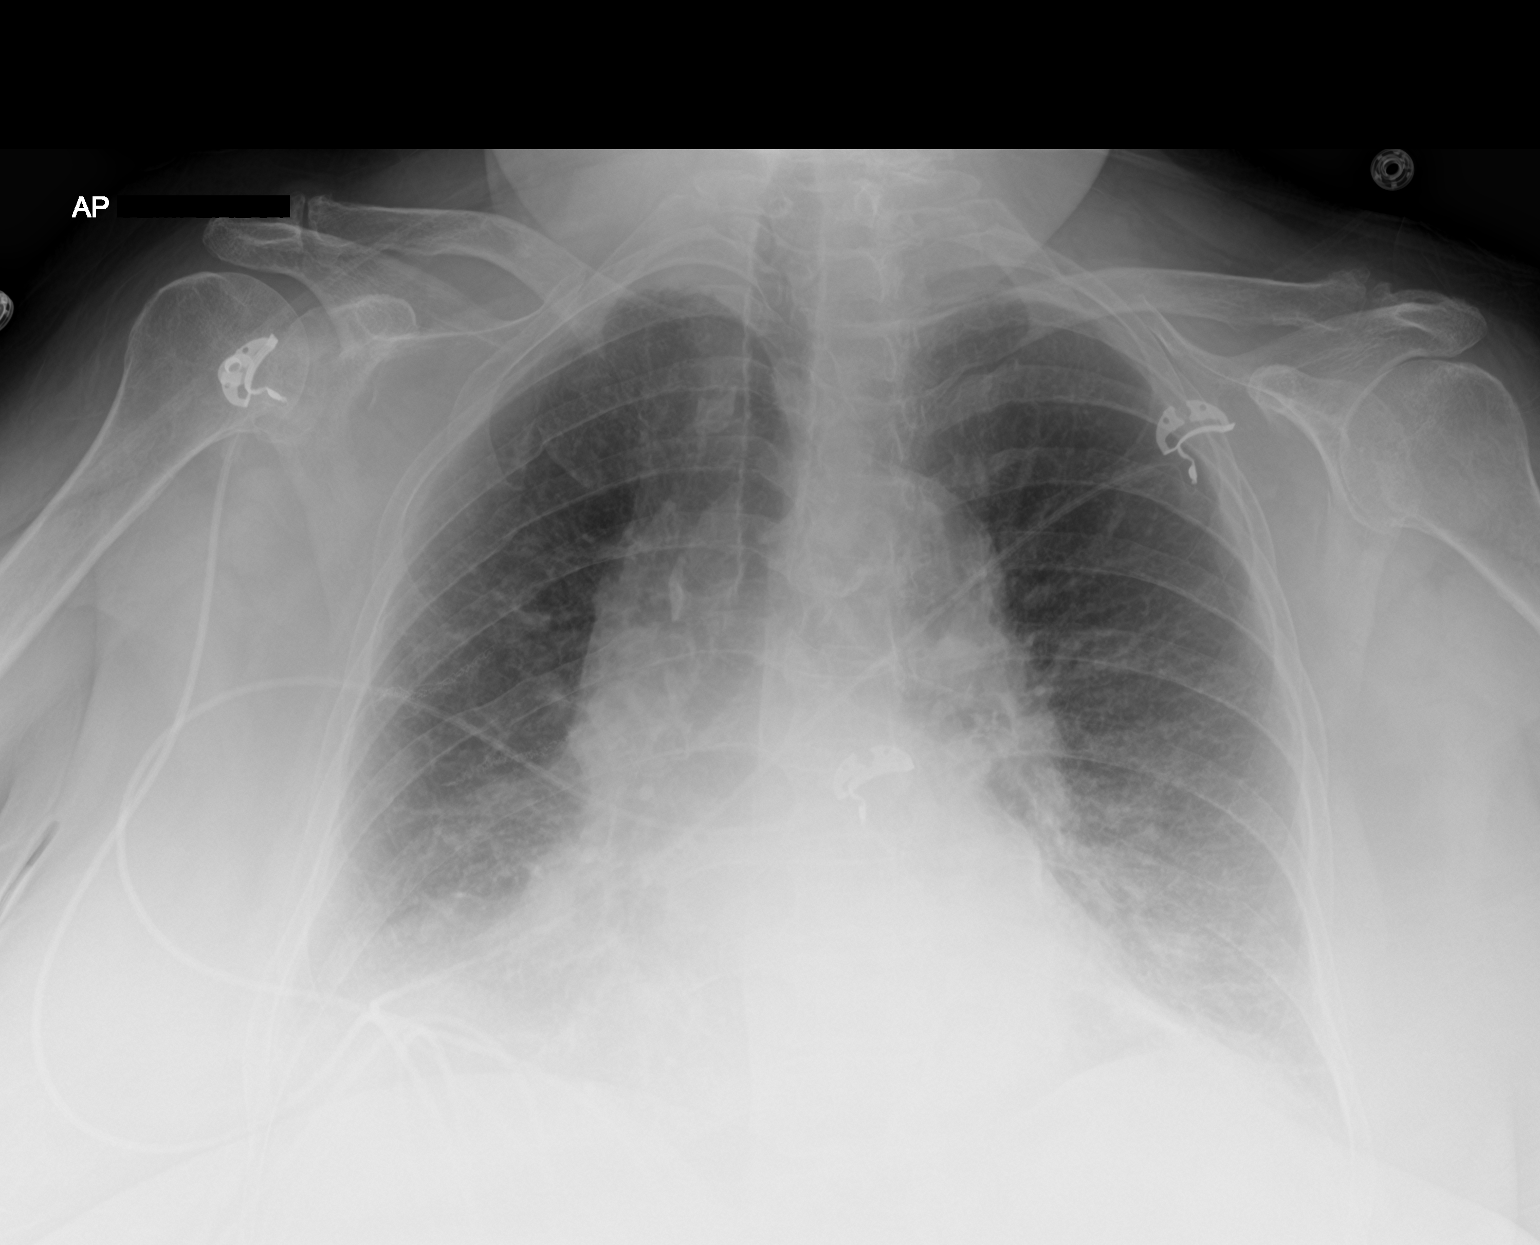

[2 of 2 positions shown; findings below may reference images not displayed]

FINDINGS: Mild cardiomegaly. Overall cardiomediastinal silhouette is stable,
presumed age related aortic ectasia. Aortic atherosclerosis.
Bibasilar opacities are stable, suspected atelectasis and/or small
pleural effusions. No new lung findings. No acute or suspicious
osseous finding.
IMPRESSION: 1. Stable chest x-ray. Bibasilar opacities, most likely atelectasis
and/or small pleural effusions, pneumonia not excluded given the
history of fever.
2. Stable cardiomegaly.
3. Aortic atherosclerosis and age related aortic ectasia.

## 2017-10-23 MED ORDER — ALUM & MAG HYDROXIDE-SIMETH 200-200-20 MG/5ML PO SUSP
15.0000 mL | Freq: Four times a day (QID) | ORAL | Status: DC | PRN
  Administered 2017-10-23 – 2017-10-25 (×5): 15 mL via ORAL
  Filled 2017-10-23 (×6): qty 30

## 2017-10-23 NOTE — Progress Notes (Addendum)
Progress Note   Druanne Bosques WUJ:[id number] DOB: 1940/09/06 DOA: 10/21/2017 PCP: [name], MD   LOS: 1 day    Brief Narrative:  Leah Velasquez is a 77 year old female with a medical history significant for lung cancer, hypertension, and morbid obesity who presented to the ED on 10/21/2017 due to flu-like symptoms with worsening weakness for 3 days prior to hospitalization. Upon ED presentation, patient was febrile at 100.9, HR 76-92, RR 18-22, BP 106/55, O2 sat 94-99% on 2 L nasal cannula. Initial labs demonstrated Na 132, potassium 4.1, chloride 100, bicarb 25, glucose 115, BUN 15, creatinine 0.96, WBC 5.7, hemoglobin 12.9, HCT 38.7, platelets 139, procalcitonin 0.24. Patient tested positive for influenza A. CXR demonstrated right base opacity suggestive of pneumonia vs. atelectasis with possible pleural effusion on the right. EKG showed atrial fibrillation with normal axis.  Patient was admitted on the working diagnosis of sepsis due to influenza A complicated by acute respiratory failure with hypoxia and CAP.  Assessment/Plan:   Principal Problem:   Acute respiratory failure with hypoxia (HCC) Active Problems:   CAP (community acquired pneumonia)   Atrial fibrillation (HCC)   Generalized weakness   Essential hypertension   Thrombocytopenia (HCC)   Hyperlipidemia  1. Acute respiratory failure with hypoxia due to influenza A and CAP: Repeat CXR to determine if the consolidation is atelectasis or pneumonia. If CXR comes back negative for pneumonia discontinue all antibiotics. Continue tamiflu. Continue conservative measures with IVFs, DUONEB q 4 hours prn for wheezing/SOB, and Zophran prn 6 hours for nausea. Patient has been experiencing multiple episodes of vomiting in which one episode was brown in color. Nursing staff believe her abdomen is more distended than patient's baseline. If patient vomits again, order abdominal x-ray to check for signs of obstruction.  2. New Atrial  Fibrillation: Upon presentation to the ED, patient was in persistent A. Fib. Echo demonstrated mild concentric hypertrophy with EF of 60-65%. Continue cardiac monitoring. Continue Eliquis for anticoagulation and Carvedilol for rate control. ChadsVasc score 3 with Had bleed score of 2.  3. Hypertension: Patient's latest BP was 128/82. Continue Losartan  4. Hyperlipidemia with morbid obesity: Patient's BMI is 46.9. Continue statin therapy. Consult PT for evaluation. Allow patient to get out of bed to regain strength.   Family Communication/Anticipated D/C date and plan/Code Status   DVT prophylaxis: Apixaban Code Status: Full Family Communication: No patient at bedside Disposition Plan: Home   Medical Consultants:      Antimicrobials:  Azithromycin 500 mg IV at 250 mL/hr (1) Rocephin 1g IV at 200 mL/hr q 24 hours (1)   Subjective:  Patient is awake and alert. She has been having multiple vomiting episodes since 6pm last night. She has felt continuously nauseous with abdominal discomfort. She is still experiencing a productive cough with clear phlegm. She denies SOB and chest pain.     Objective:   Vitals:   10/23/17 0500 10/23/17 0716 10/23/17 0804 10/23/17 0859  BP:  (!) 143/87  128/82  Pulse:  98  94  Resp:  18    Temp:  97.9 F (36.6 C)    TempSrc:  Oral    SpO2:  98% 96%   Weight: 119.2 kg (262 lb 12.6 oz)     Height:        Intake/Output Summary (Last 24 hours) at 10/23/2017 1322 Last data filed at 10/23/2017 1100 Gross per 24 hour  Intake 2465 ml  Output 700 ml  Net 1765 ml  Filed Weights   2017/11/20 2140 10/22/17 0230 10/23/17 0500  Weight: 113.4 kg (250 lb) 116.5 kg (256 lb 13.4 oz) 119.2 kg (262 lb 12.6 oz)     Physical Exam:   Constitutional: NAD, pallor, morbidly obese Eyes: lids and conjunctivae normal ENMT: Mucous membranes are moist.  Neck: normal, supple, no masses Respiratory: Decreased breath sounds bilaterally with decreased air movement.  Diffuse rhonchi heard throughout. Normal respiratory effort. No accessory muscle use.  Cardiovascular: irregularly irregular, normal S1-S2, no murmurs / rubs / gallops. No LE edema. No JVD Abdomen: protuberant, soft, nontender and mildly distended. Diffuse tenderness to palpation throughout abdomen, but increased in LUQ. No guarding or rebound tenderness. Musculoskeletal: no clubbing / cyanosis. No joint deformity upper and lower extremities.  Skin: no rashes, lesions, ulcers Neurologic: non-focal. Psychiatric: Alert and oriented x 3    Data Reviewed:   I have independently reviewed following labs and imaging studies:   CBC: Recent Labs  Lab 20-Nov-2017 0647 10/22/17 0540 10/23/17 0621  WBC 5.7 4.7 7.1  NEUTROABS 4.8  --  5.4  HGB 12.9 13.0 13.1  HCT 38.7 39.8 40.4  MCV 96.3 97.8 97.3  PLT 139* 139* 621*   Basic Metabolic Panel: Recent Labs  Lab Nov 20, 2017 0647 November 20, 2017 2239 10/22/17 0540 10/23/17 0621  NA 135 132* 132* 136  K 3.2* 4.1 3.8 3.9  CL 104 100* 98* 101  CO2 24 25 26 28   GLUCOSE 126* 115* 101* 113*  BUN 14 15 14 17   CREATININE 0.73 0.96 0.91 0.85  CALCIUM 7.8* 8.7* 8.4* 8.6*   GFR: Estimated Creatinine Clearance: 71.2 mL/min (by C-G formula based on SCr of 0.85 mg/dL). Liver Function Tests: Recent Labs  Lab 11/20/2017 2239  AST 48*  ALT 38  ALKPHOS 83  BILITOT 0.7  PROT 6.8  ALBUMIN 3.2*   No results for input(s): LIPASE, AMYLASE in the last 168 hours. No results for input(s): AMMONIA in the last 168 hours. Coagulation Profile: No results for input(s): INR, PROTIME in the last 168 hours. Cardiac Enzymes: Recent Labs  Lab 11-20-2017 0647  CKTOTAL 41   BNP (last 3 results) No results for input(s): PROBNP in the last 8760 hours. HbA1C: No results for input(s): HGBA1C in the last 72 hours. CBG: No results for input(s): GLUCAP in the last 168 hours. Lipid Profile: No results for input(s): CHOL, HDL, LDLCALC, TRIG, CHOLHDL, LDLDIRECT in the last 72  hours. Thyroid Function Tests: Recent Labs    10/22/17 0651  TSH 0.629   Anemia Panel: No results for input(s): VITAMINB12, FOLATE, FERRITIN, TIBC, IRON, RETICCTPCT in the last 72 hours. Urine analysis: No results found for: COLORURINE, APPEARANCEUR, LABSPEC, PHURINE, GLUCOSEU, HGBUR, BILIRUBINUR, KETONESUR, PROTEINUR, UROBILINOGEN, NITRITE, LEUKOCYTESUR Sepsis Labs: Invalid input(s): PROCALCITONIN, LACTICIDVEN  No results found for this or any previous visit (from the past 240 hour(s)).    Radiology Studies: No results found.    Medication:   . apixaban  5 mg Oral BID  . carvedilol  3.125 mg Oral BID WC  . colchicine  0.6 mg Oral QODAY  . fluticasone  1 spray Each Nare Daily  . guaiFENesin  600 mg Oral BID  . ipratropium-albuterol  3 mL Nebulization TID  . loratadine  10 mg Oral Daily  . losartan  100 mg Oral Daily  . oseltamivir  75 mg Oral BID  . simvastatin  20 mg Oral QHS  . timolol  1 drop Both Eyes BID  . zolpidem  5 mg Oral QHS  Continuous Infusions: . sodium chloride 75 mL/hr at 10/22/17 2218  . azithromycin Stopped (10/23/17 0736)  . cefTRIAXone (ROCEPHIN)  IV 1 g (10/23/17 0859)      Time spent: 25 minutes  Signed, Lanna Poche, PA-S [employer] Email: [email or web address]

## 2017-10-23 NOTE — Progress Notes (Signed)
[facility] for Apixaban Indication: atrial fibrillation  Allergies  Allergen Reactions  . Morphine Anaphylaxis  . Sulfa Antibiotics Rash   Patient Measurements: Height: 5\' 2"  (157.5 cm) Weight: 262 lb 12.6 oz (119.2 kg) IBW/kg (Calculated) : 50.1  Vital Signs: Temp: 97.9 F (36.6 C) (03/04 0716) Temp Source: Oral (03/04 0716) BP: 128/82 (03/04 0859) Pulse Rate: 94 (03/04 0859)  Labs: Recent Labs    10/21/17 0647 10/21/17 2239 10/22/17 0540 10/23/17 0621  HGB 12.9  --  13.0 13.1  HCT 38.7  --  39.8 40.4  PLT 139*  --  139* 143*  CREATININE 0.73 0.96 0.91 0.85  CKTOTAL 41  --   --   --    Estimated Creatinine Clearance: 71.2 mL/min (by C-G formula based on SCr of 0.85 mg/dL).  Medical History: Past Medical History:  Diagnosis Date  . Cancer ([place])    lung cancer  . Hypertension    Medications:  Scheduled:  . apixaban  5 mg Oral BID  . carvedilol  3.125 mg Oral BID WC  . colchicine  0.6 mg Oral QODAY  . fluticasone  1 spray Each Nare Daily  . guaiFENesin  600 mg Oral BID  . ipratropium-albuterol  3 mL Nebulization TID  . loratadine  10 mg Oral Daily  . losartan  100 mg Oral Daily  . oseltamivir  75 mg Oral BID  . simvastatin  20 mg Oral QHS  . timolol  1 drop Both Eyes BID  . zolpidem  5 mg Oral QHS    Assessment: 77 y/o F with acute hypoxic resp failure due to infuenza A, complicated with CAP, was found to be in atrial fibrillation.  Was started on apixaban 5 mg po BID by attending physician.    Mild thrombocytopenia noted (count runs well above 100K) CrCl > 50 mL/min Current has no drug-drug interactions that would prohibit apixaban therapy  Plan: Continue apixaban 5 mg PO BID as ordered by M.D. Completed apixaban teaching with patient, Provided patient with information sheet and a voucher to get first 30d outpatient prescription at no charge.  Clayburn Pert, PharmD, BCPS [phone] 10/23/2017  12:37 PM

## 2017-10-23 NOTE — Progress Notes (Signed)
PROGRESS NOTE    Leah Velasquez  LFY:[id number] DOB: 1940/11/17 DOA: 10/21/2017 PCP: [name], MD    Brief Narrative:  77 year old female who presented with weakness.  She does have significant past medical history of lung cancer, hypertension and morbid obesity.  Patient reported not feeling well for the last 3 days prior to hospitalization, with productive cough, generalized weakness, palpitations, diarrhea and fever.  On initial physical examination her temperature was 100.9 F, heart rate 76-92, respirations 18-22, blood pressure 106/55, oxygen saturation 94-99% on 2 L nasal cannula.  Moist mucous membranes, lungs clear to auscultation bilaterally, no wheezing, rales or rhonchi, heart S1-S2 present rhythmic, the abdomen was soft nontender, no lower extremity edema.  Sodium 132, potassium 4.1, chloride 100, bicarb 25, glucose 115, BUN 15, creatinine 0.96, white count 5.7, hemoglobin 12.9, hematocrit 38.7, platelets 139, procalcitonin 0.24.  Influenza A+.  Chest x-ray showed right rotation, right base opacity, underpenetration due to soft tissue.  EKG 97 bpm, atrial fibrillation, normal axis.  Patient was admitted to the hospital working diagnosis of sepsis due to influenza A complicated by acute hypoxic respiratory failure and community-acquired pneumonia.     Assessment & Plan:   Principal Problem:   Acute respiratory failure with hypoxia (HCC) Active Problems:   CAP (community acquired pneumonia)   Atrial fibrillation (HCC)   Generalized weakness   Essential hypertension   Thrombocytopenia (HCC)   Hyperlipidemia   1.  Acute hypoxic respiratory failure due to influenza A, complicated with community acquired pneumonia.  Oxymetry monitoring, supplemental 02 per [place], target 02 saturation greater than 92%. Continue bronchodilator therapy. Antibiotic therapy Oseltamivir. Will follow on 2 view chest film today, if negative for infiltrates will hold on antibacterial therapy.   2. Nausea  and vomiting. Likely related to acute influenza, will continue supportive medical therapy, as needed antiemetics and as needed antiemetics. If persistent symptoms will check abdominal film to rule out ileus.   2.  Atrial fibrillation. NEW.  Telemetry monitor personally reviewed, patient in persistent atrial fibrillation, tolerating well carvedilol, will continue anticoagulation with apixaban. Echocardiography with normal LV systolic function.   3.  Hypertension. Blood pressure control with losartan, systolic blood pressure 852 to 130.     4.  Dyslipidemia with morbid obesity, BMI 46.9. Will continue statin therapy, physical therapy evaluation.    DVT prophylaxis: apixaban  Code Status:  full Family Communication:  No family at the bedside Disposition Plan: home   Consultants:     Procedures:     Antimicrobials:   Ceftriaxone  Azithromycin  Oseltamivir    Subjective: Patient had significant nausea and vomiting, generalized weakness, no diarrhea, dyspnea continue to improve.   Objective: Vitals:   10/23/17 0500 10/23/17 0716 10/23/17 0804 10/23/17 0859  BP:  (!) 143/87  128/82  Pulse:  98  94  Resp:  18    Temp:  97.9 F (36.6 C)    TempSrc:  Oral    SpO2:  98% 96%   Weight: 119.2 kg (262 lb 12.6 oz)     Height:        Intake/Output Summary (Last 24 hours) at 10/23/2017 1114 Last data filed at 10/23/2017 7782 Gross per 24 hour  Intake 2345 ml  Output 700 ml  Net 1645 ml   Filed Weights   10/21/17 2140 10/22/17 0230 10/23/17 0500  Weight: 113.4 kg (250 lb) 116.5 kg (256 lb 13.4 oz) 119.2 kg (262 lb 12.6 oz)    Examination:   General: Not in  pain or dyspnea, deconditioned Neurology: Awake and alert, non focal  E ENT: mild pallor, no icterus, oral mucosa moist Cardiovascular: No JVD. S1-S2 present, rhythmic, no gallops, rubs, or murmurs. No lower extremity edema. Pulmonary: decreased breath sounds bilaterally, poor air movement, no wheezing,  rhonchi, bibasilar rales. Gastrointestinal. Abdomen protuberant and distended, no organomegaly, non tender, no rebound or guarding Skin. No rashes Musculoskeletal: no joint deformities     Data Reviewed: I have personally reviewed following labs and imaging studies  CBC: Recent Labs  Lab 10/21/17 0647 10/22/17 0540 10/23/17 0621  WBC 5.7 4.7 7.1  NEUTROABS 4.8  --  5.4  HGB 12.9 13.0 13.1  HCT 38.7 39.8 40.4  MCV 96.3 97.8 97.3  PLT 139* 139* 409*   Basic Metabolic Panel: Recent Labs  Lab 10/21/17 0647 10/21/17 2239 10/22/17 0540 10/23/17 0621  NA 135 132* 132* 136  K 3.2* 4.1 3.8 3.9  CL 104 100* 98* 101  CO2 24 25 26 28   GLUCOSE 126* 115* 101* 113*  BUN 14 15 14 17   CREATININE 0.73 0.96 0.91 0.85  CALCIUM 7.8* 8.7* 8.4* 8.6*   GFR: Estimated Creatinine Clearance: 71.2 mL/min (by C-G formula based on SCr of 0.85 mg/dL). Liver Function Tests: Recent Labs  Lab 10/21/17 2239  AST 48*  ALT 38  ALKPHOS 83  BILITOT 0.7  PROT 6.8  ALBUMIN 3.2*   No results for input(s): LIPASE, AMYLASE in the last 168 hours. No results for input(s): AMMONIA in the last 168 hours. Coagulation Profile: No results for input(s): INR, PROTIME in the last 168 hours. Cardiac Enzymes: Recent Labs  Lab 10/21/17 0647  CKTOTAL 41   BNP (last 3 results) No results for input(s): PROBNP in the last 8760 hours. HbA1C: No results for input(s): HGBA1C in the last 72 hours. CBG: No results for input(s): GLUCAP in the last 168 hours. Lipid Profile: No results for input(s): CHOL, HDL, LDLCALC, TRIG, CHOLHDL, LDLDIRECT in the last 72 hours. Thyroid Function Tests: Recent Labs    10/22/17 0651  TSH 0.629   Anemia Panel: No results for input(s): VITAMINB12, FOLATE, FERRITIN, TIBC, IRON, RETICCTPCT in the last 72 hours.    Radiology Studies: I have reviewed all of the imaging during this hospital visit personally     Scheduled Meds: . apixaban  5 mg Oral BID  . carvedilol   3.125 mg Oral BID WC  . colchicine  0.6 mg Oral QODAY  . fluticasone  1 spray Each Nare Daily  . guaiFENesin  600 mg Oral BID  . ipratropium-albuterol  3 mL Nebulization TID  . loratadine  10 mg Oral Daily  . losartan  100 mg Oral Daily  . oseltamivir  75 mg Oral BID  . simvastatin  20 mg Oral QHS  . timolol  1 drop Both Eyes BID  . zolpidem  5 mg Oral QHS   Continuous Infusions: . sodium chloride 75 mL/hr at 10/22/17 2218  . azithromycin Stopped (10/23/17 0736)  . cefTRIAXone (ROCEPHIN)  IV 1 g (10/23/17 0859)     LOS: 1 day        Raykwon Hobbs Gerome Apley, MD [facility] Pager [phone]

## 2017-10-24 LAB — BASIC METABOLIC PANEL
ANION GAP: 8 (ref 5–15)
BUN: 14 mg/dL (ref 6–20)
CHLORIDE: 101 mmol/L (ref 101–111)
CO2: 27 mmol/L (ref 22–32)
Calcium: 8.1 mg/dL — ABNORMAL LOW (ref 8.9–10.3)
Creatinine, Ser: 0.78 mg/dL (ref 0.44–1.00)
GFR calc non Af Amer: >60 mL/min (ref >60)
GLUCOSE: 136 mg/dL — AB (ref 65–99)
POTASSIUM: 3.8 mmol/L (ref 3.5–5.1)
Sodium: 136 mmol/L (ref 135–145)

## 2017-10-24 MED ORDER — AZITHROMYCIN 250 MG PO TABS
500.0000 mg | ORAL_TABLET | Freq: Every day | ORAL | Status: DC
  Administered 2017-10-25 – 2017-10-26 (×2): 500 mg via ORAL
  Filled 2017-10-24 (×2): qty 2

## 2017-10-24 MED ORDER — FUROSEMIDE 10 MG/ML IJ SOLN
40.0000 mg | Freq: Once | INTRAMUSCULAR | Status: AC
  Administered 2017-10-24: 40 mg via INTRAVENOUS
  Filled 2017-10-24: qty 4

## 2017-10-24 MED ORDER — METHYLPREDNISOLONE SODIUM SUCC 40 MG IJ SOLR
40.0000 mg | Freq: Two times a day (BID) | INTRAMUSCULAR | Status: DC
  Administered 2017-10-24 – 2017-10-25 (×3): 40 mg via INTRAVENOUS
  Filled 2017-10-24 (×3): qty 1

## 2017-10-24 NOTE — Progress Notes (Signed)
PHARMACIST - PHYSICIAN COMMUNICATION DR:   Cathlean Sauer CONCERNING: Antibiotic IV to Oral Route Change Policy  RECOMMENDATION: This patient is receiving Zithromax by the intravenous route.  Based on criteria approved by the Pharmacy and Therapeutics Committee, the antibiotic(s) is/are being converted to the equivalent oral dose form(s).   DESCRIPTION: These criteria include:  Patient being treated for a respiratory tract infection, urinary tract infection, cellulitis or clostridium difficile associated diarrhea if on metronidazole  The patient is not neutropenic and does not exhibit a GI malabsorption state  The patient is eating (either orally or via tube) and/or has been taking other orally administered medications for a least 24 hours  The patient is improving clinically and has a Tmax < 100.5  If you have questions about this conversion, please contact the Pharmacy Department  []   [phone] )  [facility] []   [phone] )  [facility] []   [phone] )  [facility] []   [phone] )  [facility] [x]   [phone] )  [place], PharmD, BCPS 10/24/2017@11 :21 AM

## 2017-10-24 NOTE — Progress Notes (Addendum)
Progress Note   Leah Velasquez GLO:[id number] DOB: July 07, 1941 DOA: 10/21/2017 PCP: [name], MD   LOS: 2 days    Brief Narrative:  Leah Velasquez is a 77 year old female with a medical history significant for lung cancer, hypertension, and morbid obesity who presented to the ED on 10/21/2017 due to flu-like symptoms with worsening weakness for 3 days prior to hospitalization. Upon ED presentation, patient was febrile at 100.9, HR 76-92, RR 18-22, BP 106/55, O2 sat 94-99% on 2 L nasal cannula. Initial labs demonstrated Na 132, potassium 4.1, chloride 100, bicarb 25, glucose 115, BUN 15, creatinine 0.96, WBC 5.7, hemoglobin 12.9, HCT 38.7, platelets 139, procalcitonin 0.24. Patient tested positive for influenza A. CXR demonstrated right base opacity suggestive of pneumonia vs. atelectasis with possible pleural effusion on the right. EKG showed atrial fibrillation with normal axis.  Patient was admitted on the working diagnosis of sepsis due to influenza A complicated by acute respiratory failure with hypoxia and community-acquired pneumonia.  Assessment/Plan:   Principal Problem:   Acute respiratory failure with hypoxia (HCC) Active Problems:   CAP (community acquired pneumonia)   Atrial fibrillation (HCC)   Generalized weakness   Essential hypertension   Thrombocytopenia (HCC)   Hyperlipidemia  1. Acute respiratory failure with hypoxia due to influenza A: Newest CXR demonstrated bibasilar opacities that were suggestive of atelectasis and/or pleural effusion with stable cardiomegaly. Etiology of fever most likely from influenza and not pneumonia due to newest imaging studies. Patient admits to having multiple episodes of illness during winter months over the past few years, most likely COPD etiology due to past nicotine abuse. Discontinue Rocephin, but continue Azithromycin for anti-inflammatory benefits of the lungs. Continue Tamiflu and conservative measures with IVFs, DUONEB q 4 hours prn  for wheezing/SOB, and Zophran prn 6 hours for nausea. Start SOLU-MEDROL 40 mg IV q 12 hours and Lasix 40mg  IV x 1 dose.  2. New Atrial Fibrillation: Upon presentation to the ED, patient was in persistent A. Fib. Echo demonstrated mild concentric hypertrophy with EF of 60-65%. Continue Eliquis for anticoagulation and Carvedilol for rate control. ChadsVasc score 3 with Had bleed score of 2. Patient has remained stable in regards to rate control. Discontinue cardiac monitoring.  3. Hypertension: Continue Losartan. Systolic BP 188-416S.   4. Hyperlipidemia with morbid obesity: Patient's BMI is 46.9. Continue statin therapy. PT evaluation.  5. History of Pseudogout: Patient is complaining of increased redness and swelling of left ankle. Continue at home medications of colchicine 0.6mg  every other day for prophylaxis of pseudogout.    Family Communication/Anticipated D/C date and plan/Code Status   DVT prophylaxis: Apixaban Code Status: Full Family Communication: No family at bedside Disposition Plan: home   Medical Consultants:      Antimicrobials:  Azithromycin 500mg  (start 10/25/17 due to administration of previous abx this morning)   Subjective:  Patient feels like she has improved within the past 24 hours. She is no longer experiencing vomiting episodes, but is experiencing intermittent nausea. She had a BM and admits to passing flatulence. She admits to having left ankle pain with swelling and warmth to touch. She has a history for pseudogout in which she takes Colchicine 0.6mg  every other day. She admits to wheezing. She denies SOB and CP.   Objective:   Vitals:   10/23/17 2142 10/23/17 2204 10/24/17 0445 10/24/17 0812  BP: 130/84  129/85   Pulse: (!) 49  89   Resp: 20  (!) 21   Temp: 98.6 F (  37 C)  98.8 F (37.1 C)   TempSrc: Oral  Oral   SpO2: 97% 99% 99% 95%  Weight:      Height:        Intake/Output Summary (Last 24 hours) at 10/24/2017 0935 Last data filed at  10/24/2017 0600 Gross per 24 hour  Intake 2447.5 ml  Output 700 ml  Net 1747.5 ml   Filed Weights   11/14/2017 2140 10/22/17 0230 10/23/17 0500  Weight: 113.4 kg (250 lb) 116.5 kg (256 lb 13.4 oz) 119.2 kg (262 lb 12.6 oz)     Physical Exam:   Constitutional: NAD, deconditioned Eyes: lids and conjunctivae normal ENMT: Mucous membranes are moist.  Neck: normal, supple, no masses Respiratory: Diffuse rhonchi bilaterally with expiratory wheeze mostly localized to anterior chest. no crackles. Normal respiratory effort. No accessory muscle use.  Cardiovascular: S1-S2 present, no murmurs / rubs / gallops. No LE edema. No JVD Abdomen: Abdomen protuberant and distended, non-tender, no rebound or guarding.  Musculoskeletal: no clubbing / cyanosis. No joint deformity upper and lower extremities. Normal muscle tone.  Skin: no rashes, lesions, ulcers. Neurologic: non-focal Psychiatric: Alert and oriented x 3    Data Reviewed:   I have independently reviewed following labs and imaging studies:   CBC: Recent Labs  Lab 14-Nov-2017 0647 10/22/17 0540 10/23/17 0621  WBC 5.7 4.7 7.1  NEUTROABS 4.8  --  5.4  HGB 12.9 13.0 13.1  HCT 38.7 39.8 40.4  MCV 96.3 97.8 97.3  PLT 139* 139* 938*   Basic Metabolic Panel: Recent Labs  Lab 11-14-17 0647 11/14/17 2239 10/22/17 0540 10/23/17 0621 10/24/17 0634  NA 135 132* 132* 136 136  K 3.2* 4.1 3.8 3.9 3.8  CL 104 100* 98* 101 101  CO2 24 25 26 28 27   GLUCOSE 126* 115* 101* 113* 136*  BUN 14 15 14 17 14   CREATININE 0.73 0.96 0.91 0.85 0.78  CALCIUM 7.8* 8.7* 8.4* 8.6* 8.1*   GFR: Estimated Creatinine Clearance: 75.7 mL/min (by C-G formula based on SCr of 0.78 mg/dL). Liver Function Tests: Recent Labs  Lab 14-Nov-2017 2239  AST 48*  ALT 38  ALKPHOS 83  BILITOT 0.7  PROT 6.8  ALBUMIN 3.2*   No results for input(s): LIPASE, AMYLASE in the last 168 hours. No results for input(s): AMMONIA in the last 168 hours. Coagulation  Profile: No results for input(s): INR, PROTIME in the last 168 hours. Cardiac Enzymes: Recent Labs  Lab November 14, 2017 0647  CKTOTAL 41   BNP (last 3 results) No results for input(s): PROBNP in the last 8760 hours. HbA1C: No results for input(s): HGBA1C in the last 72 hours. CBG: No results for input(s): GLUCAP in the last 168 hours. Lipid Profile: No results for input(s): CHOL, HDL, LDLCALC, TRIG, CHOLHDL, LDLDIRECT in the last 72 hours. Thyroid Function Tests: Recent Labs    10/22/17 0651  TSH 0.629   Anemia Panel: No results for input(s): VITAMINB12, FOLATE, FERRITIN, TIBC, IRON, RETICCTPCT in the last 72 hours. Urine analysis: No results found for: COLORURINE, APPEARANCEUR, LABSPEC, PHURINE, GLUCOSEU, HGBUR, BILIRUBINUR, KETONESUR, PROTEINUR, UROBILINOGEN, NITRITE, LEUKOCYTESUR Sepsis Labs: Invalid input(s): PROCALCITONIN, LACTICIDVEN  No results found for this or any previous visit (from the past 240 hour(s)).    Radiology Studies: Dg Chest 2 View  Result Date: 10/23/2017 CLINICAL DATA:  Weakness. History of lung cancer, hypertension and morbid obesity. Productive cough, palpitations, diarrhea and fever. EXAM: CHEST  2 VIEW COMPARISON:  Chest x-ray dated 2017/11/14. FINDINGS: Mild cardiomegaly. 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 Aortic atherosclerosis. Bibasilar opacities are stable, suspected atelectasis and/or small pleural effusions. No new lung findings. No acute or suspicious osseous finding. IMPRESSION: 1. Stable chest x-ray. Bibasilar opacities, most likely atelectasis and/or small pleural effusions, pneumonia not excluded given the history of fever. 2. Stable cardiomegaly. 3. 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 Electronically Signed   By: Franki Cabot M.D.   On: 10/23/2017 16:26      Medication:   . apixaban  5 mg Oral BID  . carvedilol  3.125 mg Oral BID WC  . colchicine  0.6 mg Oral QODAY  .  fluticasone  1 spray Each Nare Daily  . guaiFENesin  600 mg Oral BID  . ipratropium-albuterol  3 mL Nebulization TID  . loratadine  10 mg Oral Daily  . losartan  100 mg Oral Daily  . oseltamivir  75 mg Oral BID  . simvastatin  20 mg Oral QHS  . timolol  1 drop Both Eyes BID  . zolpidem  5 mg Oral QHS    Continuous Infusions: . sodium chloride 50 mL/hr at 10/23/17 1327  . azithromycin 500 mg (10/24/17 0534)  . cefTRIAXone (ROCEPHIN)  IV 1 g (10/24/17 0911)      Time spent: 25 minutes  Signed, Lanna Poche, PA-S [employer] Email: [email or web address]

## 2017-10-24 NOTE — Progress Notes (Signed)
PROGRESS NOTE    Leah Velasquez  NUU:[id number] DOB: May 20, 1941 DOA: 10/21/2017 PCP: [name], MD    Brief Narrative:  77 year old female who presented with weakness. She does have significant past medical history of lung cancer, hypertension and morbid obesity. Patient reported not feeling well for the last 3 days prior to hospitalization, with productive cough,generalized weakness,palpitations, diarrhea and fever.On initial physical examination her temperature was 100.9 F, heart rate 76-92, respirations 18-22, blood pressure 106/55, oxygen saturation 94-99% on 2 L nasal cannula. Moist mucous membranes, lungs clear to auscultation bilaterally, no wheezing, rales or rhonchi, heart S1-S2 present rhythmic, the abdomen was soft nontender, no lower extremity edema. Sodium 132, potassium 4.1, chloride 100, bicarb 25, glucose 115, BUN 15, creatinine 0.96, white count 5.7, hemoglobin 12.9, hematocrit 38.7, platelets 139, procalcitonin 0.24.Influenza A+. Chest x-ray showed right rotation, right base opacity, underpenetration due to soft tissue. EKG 97 bpm, atrial fibrillation, normal axis.  Patient was admitted to the hospital working diagnosis of sepsis due to influenza A complicated by acute hypoxic respiratory failure and community-acquired pneumonia.   Assessment & Plan:   Principal Problem:   Acute respiratory failure with hypoxia (HCC) Active Problems:   CAP (community acquired pneumonia)   Atrial fibrillation (HCC)   Generalized weakness   Essential hypertension   Thrombocytopenia (HCC)   Hyperlipidemia  1.Acute hypoxic respiratory failure due to influenza A, complicated with community viral pneumonia and acute COPD exacerbation.  Continue oxymetry monitoring and supplemental 02 per [place]. Patient with persistent wheezing, has history of frequent bronchitis episodes over last 2 years, positive smoking history, suspected undiagnosed COPD, will add systemic steroids and continue  bronchodilator therapy. Continue Oseltamivir. Follow chest film with bilateral bibasilar atelectasis, will de-escalate antibiotic therapy to monotherapy with azithromycin for 5 days for COPD exacerbation. Patient will need outpatient PFT.   2. Nausea and vomiting. Likely related to acute influenza,. Improved symptoms, no further GI symptoms, patient is tolerating po well.  2.New atrial fibrillation (ChadsVasc score 3, Has bled score 2) Will continue carvedilol for rate control, anticoagulation with apixaban. Echocardiography with normal LV systolic function. Chest film with increase vascular congestion, possible volume overload pulmonary edema, will give one dose of furosemide. Echocardiogram with preserved LV systolic function.   3.Hypertension. Continue blood pressure control with losartan.   4.Dyslipidemia with morbid obesity, BMI 46.9. Tolerating well statin therapy, follow on physical therapy evaluation.   DVT prophylaxis:apixaban Code Status:full Family Communication:No family at the bedside Disposition Plan:home/ possible discharge in 24 to 48 hours depending on clinical improvement.    Consultants:    Procedures:    Antimicrobials:    Azithromycin  Oseltamivir   Subjective: Patient with persistent dyspnea and wheezing, no chest pain or palpitation, improved nausea and vomiting, no diarrhea.   Objective: Vitals:   10/23/17 2142 10/23/17 2204 10/24/17 0445 10/24/17 0812  BP: 130/84  129/85   Pulse: (!) 49  89   Resp: 20  (!) 21   Temp: 98.6 F (37 C)  98.8 F (37.1 C)   TempSrc: Oral  Oral   SpO2: 97% 99% 99% 95%  Weight:      Height:        Intake/Output Summary (Last 24 hours) at 10/24/2017 1210 Last data filed at 10/24/2017 0600 Gross per 24 hour  Intake 2327.5 ml  Output 700 ml  Net 1627.5 ml   Filed Weights   10/21/17 2140 10/22/17 0230 10/23/17 0500  Weight: 113.4 kg (250 lb) 116.5 kg (256 lb 13.4 oz)  119.2 kg (262  lb 12.6 oz)    Examination:   General: Not in pain or dyspnea, deconditioned Neurology: Awake and alert, non focal  E ENT: mild pallor, no icterus, oral mucosa moist Cardiovascular: No JVD. S1-S2 present, rhythmic, no gallops, rubs, or murmurs. Trace lower extremity edema. Pulmonary: decreased breath sounds bilaterally, poor air movement, positive diffuse expiratory wheezing, no rhonchi or rales. Gastrointestinal. Abdomen protuberant, non distended, no organomegaly, non tender, no rebound or guarding Skin. No rashes Musculoskeletal: no joint deformities     Data Reviewed: I have personally reviewed following labs and imaging studies  CBC: Recent Labs  Lab 10/21/17 0647 10/22/17 0540 10/23/17 0621  WBC 5.7 4.7 7.1  NEUTROABS 4.8  --  5.4  HGB 12.9 13.0 13.1  HCT 38.7 39.8 40.4  MCV 96.3 97.8 97.3  PLT 139* 139* 423*   Basic Metabolic Panel: Recent Labs  Lab 10/21/17 0647 10/21/17 2239 10/22/17 0540 10/23/17 0621 10/24/17 0634  NA 135 132* 132* 136 136  K 3.2* 4.1 3.8 3.9 3.8  CL 104 100* 98* 101 101  CO2 24 25 26 28 27   GLUCOSE 126* 115* 101* 113* 136*  BUN 14 15 14 17 14   CREATININE 0.73 0.96 0.91 0.85 0.78  CALCIUM 7.8* 8.7* 8.4* 8.6* 8.1*   GFR: Estimated Creatinine Clearance: 75.7 mL/min (by C-G formula based on SCr of 0.78 mg/dL). Liver Function Tests: Recent Labs  Lab 10/21/17 2239  AST 48*  ALT 38  ALKPHOS 83  BILITOT 0.7  PROT 6.8  ALBUMIN 3.2*   No results for input(s): LIPASE, AMYLASE in the last 168 hours. No results for input(s): AMMONIA in the last 168 hours. Coagulation Profile: No results for input(s): INR, PROTIME in the last 168 hours. Cardiac Enzymes: Recent Labs  Lab 10/21/17 0647  CKTOTAL 41   BNP (last 3 results) No results for input(s): PROBNP in the last 8760 hours. HbA1C: No results for input(s): HGBA1C in the last 72 hours. CBG: No results for input(s): GLUCAP in the last 168 hours. Lipid Profile: No results for  input(s): CHOL, HDL, LDLCALC, TRIG, CHOLHDL, LDLDIRECT in the last 72 hours. Thyroid Function Tests: Recent Labs    10/22/17 0651  TSH 0.629   Anemia Panel: No results for input(s): VITAMINB12, FOLATE, FERRITIN, TIBC, IRON, RETICCTPCT in the last 72 hours.    Radiology Studies: I have reviewed all of the imaging during this hospital visit personally     Scheduled Meds: . apixaban  5 mg Oral BID  . [START ON 10/25/2017] azithromycin  500 mg Oral Daily  . carvedilol  3.125 mg Oral BID WC  . colchicine  0.6 mg Oral QODAY  . fluticasone  1 spray Each Nare Daily  . guaiFENesin  600 mg Oral BID  . ipratropium-albuterol  3 mL Nebulization TID  . loratadine  10 mg Oral Daily  . losartan  100 mg Oral Daily  . methylPREDNISolone (SOLU-MEDROL) injection  40 mg Intravenous Q12H  . oseltamivir  75 mg Oral BID  . simvastatin  20 mg Oral QHS  . timolol  1 drop Both Eyes BID  . zolpidem  5 mg Oral QHS   Continuous Infusions:   LOS: 2 days        Mauricio Gerome Apley, MD [facility] Pager [phone]

## 2017-10-24 NOTE — Evaluation (Signed)
Physical Therapy Evaluation Patient Details Name: Leah Velasquez MRN: [id number] DOB: February 13, 1941 Today's Date: 10/24/2017   History of Present Illness  77 year old female who presented 10/21/17 with weakness, SOB, flu and pnuemonia,hypoxia. Patient in ED after slip and fall from bed night pTA. She does have significant past medical history of lung cancer, hypertension and morbid obesity.    Clinical Impression  The patient ambulated x 15' x 2 with RW. Reports feeling weak. Oxygen saturation 89 on RA  For mobility. BP 106/63. Replaced O2 at 3 liters with sats at 99%.    Follow Up Recommendations SNF;Home health PT- patient wants to see how she progresses. Spouse has flu    Equipment Recommendations  Rolling walker with 5" wheels(needs short  but wide)    Recommendations for Other Services       Precautions / Restrictions Precautions Precautions: Fall Precaution Comments: monitor sats, BP      Mobility  Bed Mobility Overal bed mobility: Needs Assistance Bed Mobility: Supine to Sit;Sit to Supine     Supine to sit: Min assist Sit to supine: Mod assist   General bed mobility comments: gentle assist to sit up, mod assist for legs onto bed  Transfers Overall transfer level: Needs assistance Equipment used: Rolling walker (2 wheeled) Transfers: Sit to/from Stand Sit to Stand: Min assist         General transfer comment: steady assist to rise for med and toilet with rail.  Ambulation/Gait Ambulation/Gait assistance: Min assist Ambulation Distance (Feet): 15 Feet(x2) Assistive device: Rolling walker (2 wheeled) Gait Pattern/deviations: Step-to pattern;Shuffle     General Gait Details: slow    Stairs            Wheelchair Mobility    Modified Rankin (Stroke Patients Only)       Balance                                             Pertinent Vitals/Pain Pain Assessment: Faces Faces Pain Scale: Hurts even more Pain Location: right hip Pain  Descriptors / Indicators: Discomfort Pain Intervention(s): Monitored during session;Limited activity within patient's tolerance    Home Living Family/patient expects to be discharged to:: Private residence Living Arrangements: Spouse/significant other Available Help at Discharge: Family Type of Home: House Home Access: Level entry     Home Layout: One level Home Equipment: Shower seat      Prior Function Level of Independence: Independent               Hand Dominance        Extremity/Trunk Assessment   Upper Extremity Assessment Upper Extremity Assessment: Generalized weakness    Lower Extremity Assessment Lower Extremity Assessment: Generalized weakness    Cervical / Trunk Assessment Cervical / Trunk Assessment: Normal  Communication   Communication: No difficulties  Cognition Arousal/Alertness: Awake/alert Behavior During Therapy: WFL for tasks assessed/performed Overall Cognitive Status: Within Functional Limits for tasks assessed                                        General Comments      Exercises     Assessment/Plan    PT Assessment Patient needs continued PT services  PT Problem List Decreased strength;Decreased knowledge of use of DME;Decreased activity tolerance;Decreased knowledge of precautions;Decreased mobility;Cardiopulmonary  status limiting activity;Pain       PT Treatment Interventions DME instruction;Gait training;Functional mobility training;Therapeutic activities;Therapeutic exercise;Patient/family education    PT Goals (Current goals can be found in the Care Plan section)  Acute Rehab PT Goals Patient Stated Goal: to get back being independent PT Goal Formulation: With patient Time For Goal Achievement: 11/07/17 Potential to Achieve Goals: Good    Frequency Min 2X/week   Barriers to discharge        Co-evaluation               AM-PAC PT "6 Clicks" Daily Activity  Outcome Measure Difficulty  turning over in bed (including adjusting bedclothes, sheets and blankets)?: Unable Difficulty moving from lying on back to sitting on the side of the bed? : Unable Difficulty sitting down on and standing up from a chair with arms (e.g., wheelchair, bedside commode, etc,.)?: Unable Help needed moving to and from a bed to chair (including a wheelchair)?: Total Help needed walking in hospital room?: Total Help needed climbing 3-5 steps with a railing? : Total 6 Click Score: 6    End of Session   Activity Tolerance: Patient limited by fatigue Patient left: in bed;with call bell/phone within reach;with bed alarm set Nurse Communication: Mobility status PT Visit Diagnosis: Unsteadiness on feet (R26.81);Pain Pain - Right/Left: Right Pain - part of body: Hip    Time: 1100-1145 PT Time Calculation (min) (ACUTE ONLY): 45 min   Charges:   PT Evaluation $PT Eval Low Complexity: 1 Low PT Treatments $Gait Training: 8-22 mins $Self Care/Home Management: 8-22   PT G Codes:        Claretha Cooper 10/24/2017, 11:53 AM

## 2017-10-25 LAB — BASIC METABOLIC PANEL
Anion gap: 10 (ref 5–15)
BUN: 17 mg/dL (ref 6–20)
CALCIUM: 8.9 mg/dL (ref 8.9–10.3)
CO2: 31 mmol/L (ref 22–32)
CREATININE: 0.73 mg/dL (ref 0.44–1.00)
Chloride: 93 mmol/L — ABNORMAL LOW (ref 101–111)
GFR calc non Af Amer: >60 mL/min (ref >60)
Glucose, Bld: 179 mg/dL — ABNORMAL HIGH (ref 65–99)
Potassium: 3.9 mmol/L (ref 3.5–5.1)
SODIUM: 134 mmol/L — AB (ref 135–145)

## 2017-10-25 MED ORDER — PANTOPRAZOLE SODIUM 40 MG PO TBEC
40.0000 mg | DELAYED_RELEASE_TABLET | Freq: Every day | ORAL | Status: DC
  Administered 2017-10-25 – 2017-10-26 (×2): 40 mg via ORAL
  Filled 2017-10-25 (×2): qty 1

## 2017-10-25 MED ORDER — PREDNISONE 50 MG PO TABS
50.0000 mg | ORAL_TABLET | Freq: Every day | ORAL | Status: DC
  Administered 2017-10-26: 50 mg via ORAL
  Filled 2017-10-25 (×2): qty 1

## 2017-10-25 MED ORDER — CALCIUM CARBONATE ANTACID 500 MG PO CHEW
1.0000 | CHEWABLE_TABLET | Freq: Three times a day (TID) | ORAL | Status: DC | PRN
  Administered 2017-10-26: 200 mg via ORAL
  Filled 2017-10-25 (×2): qty 1

## 2017-10-25 NOTE — Care Management Important Message (Addendum)
Important Message  Patient Details IM Letter given to Nora/Case Manager to present to the Patient Name: Leah Velasquez MRN: [id number] Date of Birth: 04-16-1941   Medicare Important Message Given:  Yes    Kerin Salen 10/25/2017, 11:20 AMImportant Message  Patient Details  Name: Leah Velasquez MRN: [id number] Date of Birth: 05-Aug-1941   Medicare Important Message Given:  Yes    Kerin Salen 10/25/2017, 11:20 AM

## 2017-10-25 NOTE — Care Management Note (Signed)
Case Management Note  Patient Details  Name: Leah Velasquez MRN: [id number] Date of Birth: 1940-09-07  Subjective/Objective:  Pt admitted with Acute Respiratory failure                  Action/Plan: Pt discharging home with [facility].   Expected Discharge Date:  10/25/17               Expected Discharge Plan:  [place]  In-House Referral:  Clinical Social Work  Discharge planning Services  CM Consult  Post Acute Care Choice:    Choice offered to:  Patient  DME Arranged:    DME Agency:     HH Arranged:  RN, PT, NA [place] Agency:  [facility] (now [facility])  Status of Service:  In process, will continue to follow  If discussed at Long Length of Stay Meetings, dates discussed:    Additional CommentsPurcell Mouton, RN 10/25/2017, 3:00 PM

## 2017-10-25 NOTE — Progress Notes (Signed)
[facility] PROGRESS NOTE    Progress Note  Leah Velasquez  ZOX:[id number] DOB: [birth year] DOA: 10/21/2017 PCP: [name], MD     Brief Narrative:   Leah Velasquez is an 77 y.o. female past medical history of lung cancer, hypertension comes in for not feeling well 3 days prior to hospitalization, with productive cough palpitation diarrhea and fever, was hypoxic at the time of admission and was found to have influenza A for which Tamiflu was started, which was complicated with an acute COPD exacerbation  Assessment/Plan:   Acute respiratory failure with hypoxia ([place]) due to influenza A complicated by community viral pneumonia/acute COPD exacerbation: Was started empirically on systemic steroids, inhalers, Tamiflu. Chest x-ray films show bibasilar atelectasis, antibiotic coverage was de-escalated to monotherapy with azithromycin. She is off oxygen today we will ambulated check oxygen saturation change steroids to oral.  Nausea and vomiting likely due to acute influenza: No further vomiting, tolerating her diet.  New atrial fibrillation with a chads score greater than 2: She is currently rate controlled, continue apixaban. Chest x-ray on admission showed possible volume overload she was given a single dose of IV Lasix on 10/24/2016 and her respiration is significantly improved.  Essential hypertension: Likely high today, continue losartan. Keep an eye on it tomorrow if it continues to be high will need further follow-up as an outpatient.  Dyslipidemia:  With a BMI of 47 continue statin, awaiting physical therapy evaluation.    DVT prophylaxis: Apixiban Family Communication:none Disposition Plan/Barrier to D/C: Hopefully home in the morning if she does well overnight. Code Status:     Code Status Orders  (From admission, onward)        Start     Ordered   10/22/17 0057  Full code  Continuous     10/22/17 0104    Code Status History    Date Active Date Inactive Code  Status Order ID Comments User Context   This patient has a current code status but no historical code status.        IV Access:    Peripheral IV   Procedures and diagnostic studies:   Dg Chest 2 View  Result Date: 10/23/2017 CLINICAL DATA:  Weakness. History of lung cancer, hypertension and morbid obesity. Productive cough, palpitations, diarrhea and fever. EXAM: CHEST  2 VIEW COMPARISON:  Chest x-ray dated 10/21/2017. FINDINGS: Mild cardiomegaly. 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 Aortic atherosclerosis. Bibasilar opacities are stable, suspected atelectasis and/or small pleural effusions. No new lung findings. No acute or suspicious osseous finding. IMPRESSION: 1. Stable chest x-ray. Bibasilar opacities, most likely atelectasis and/or small pleural effusions, pneumonia not excluded given the history of fever. 2. Stable cardiomegaly. 3. 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 Electronically Signed   By: Franki Cabot M.D.   On: 10/23/2017 16:26     Medical Consultants:    None.  Anti-Infectives:   Azithro  Subjective:    Leah Velasquez she relates her breathing is much improved.  Objective:    Vitals:   10/24/17 2112 10/24/17 2136 10/25/17 0500 10/25/17 0926  BP:  130/89 (!) 150/95   Pulse:  81 84   Resp:  20 20   Temp:  98.3 F (36.8 C) 98.6 F (37 C)   TempSrc:  Oral Oral   SpO2: 94% 91% 91% 94%  Weight:      Height:        Intake/Output Summary (Last 24 hours) at 10/25/2017 1148 Last data filed  at 10/24/2017 1630 Gross per 24 hour  Intake -  Output 1400 ml  Net -1400 ml   Filed Weights   10/21/17 2140 10/22/17 0230 10/23/17 0500  Weight: 113.4 kg (250 lb) 116.5 kg (256 lb 13.4 oz) 119.2 kg (262 lb 12.6 oz)    Exam: General exam: In no acute distress. Respiratory system: Good air movement and wheezing bilaterally. Cardiovascular system: S1 & S2 heard, RRR.  Gastrointestinal system: Abdomen  is nondistended, soft and nontender.  Central nervous system: Alert and oriented. No focal neurological deficits. Extremities: No pedal edema. Skin: No rashes, lesions or ulcers Psychiatry: Judgement and insight appear normal. Mood & affect appropriate.    Data Reviewed:    Labs: Basic Metabolic Panel: Recent Labs  Lab 10/21/17 2239 10/22/17 0540 10/23/17 0621 10/24/17 0634 10/25/17 0644  NA 132* 132* 136 136 134*  K 4.1 3.8 3.9 3.8 3.9  CL 100* 98* 101 101 93*  CO2 25 26 28 27 31   GLUCOSE 115* 101* 113* 136* 179*  BUN 15 14 17 14 17   CREATININE 0.96 0.91 0.85 0.78 0.73  CALCIUM 8.7* 8.4* 8.6* 8.1* 8.9   GFR Estimated Creatinine Clearance: 75.7 mL/min (by C-G formula based on SCr of 0.73 mg/dL). Liver Function Tests: Recent Labs  Lab 10/21/17 2239  AST 48*  ALT 38  ALKPHOS 83  BILITOT 0.7  PROT 6.8  ALBUMIN 3.2*   No results for input(s): LIPASE, AMYLASE in the last 168 hours. No results for input(s): AMMONIA in the last 168 hours. Coagulation profile No results for input(s): INR, PROTIME in the last 168 hours.  CBC: Recent Labs  Lab 10/21/17 0647 10/22/17 0540 10/23/17 0621  WBC 5.7 4.7 7.1  NEUTROABS 4.8  --  5.4  HGB 12.9 13.0 13.1  HCT 38.7 39.8 40.4  MCV 96.3 97.8 97.3  PLT 139* 139* 143*   Cardiac Enzymes: Recent Labs  Lab 10/21/17 0647  CKTOTAL 41   BNP (last 3 results) No results for input(s): PROBNP in the last 8760 hours. CBG: No results for input(s): GLUCAP in the last 168 hours. D-Dimer: No results for input(s): DDIMER in the last 72 hours. Hgb A1c: No results for input(s): HGBA1C in the last 72 hours. Lipid Profile: No results for input(s): CHOL, HDL, LDLCALC, TRIG, CHOLHDL, LDLDIRECT in the last 72 hours. Thyroid function studies: No results for input(s): TSH, T4TOTAL, T3FREE, THYROIDAB in the last 72 hours.  Invalid input(s): FREET3 Anemia work up: No results for input(s): VITAMINB12, FOLATE, FERRITIN, TIBC, IRON,  RETICCTPCT in the last 72 hours. Sepsis Labs: Recent Labs  Lab 10/21/17 0647 10/21/17 2253 10/22/17 0540 10/23/17 0947  PROCALCITON  --   --  0.24  --   WBC 5.7  --  4.7 7.1  LATICACIDVEN  --  0.64  --   --    Microbiology No results found for this or any previous visit (from the past 240 hour(s)).   Medications:   . apixaban  5 mg Oral BID  . azithromycin  500 mg Oral Daily  . carvedilol  3.125 mg Oral BID WC  . colchicine  0.6 mg Oral QODAY  . fluticasone  1 spray Each Nare Daily  . guaiFENesin  600 mg Oral BID  . ipratropium-albuterol  3 mL Nebulization TID  . loratadine  10 mg Oral Daily  . losartan  100 mg Oral Daily  . methylPREDNISolone (SOLU-MEDROL) injection  40 mg Intravenous Q12H  . oseltamivir  75 mg Oral BID  .  pantoprazole  40 mg Oral Daily  . simvastatin  20 mg Oral QHS  . timolol  1 drop Both Eyes BID  . zolpidem  5 mg Oral QHS   Continuous Infusions:   LOS: 3 days   [facility] Pager [phone]  *Please refer to [email or web address], password [email or web address] to get updated schedule on who will round on this patient, as hospitalists switch teams weekly. If 7PM-7AM, please contact night-coverage at [email or web address], password [email or web address] for any overnight needs.  10/25/2017, 11:48 AM

## 2017-10-25 NOTE — Clinical Social Work Note (Signed)
Clinical Social Work Assessment  Patient Details  Name: Leah Velasquez MRN: [id number] Date of Birth: 11/28/1940  Date of referral:  10/25/17               Reason for consult:  Facility Placement                Permission sought to share information with:  Case Manager Permission granted to share information::  No  Name::        Agency::     Relationship::     Contact Information:     Housing/Transportation Living arrangements for the past 2 months:  Single Family Home Source of Information:  Patient Patient Interpreter Needed:  None Criminal Activity/Legal Involvement Pertinent to Current Situation/Hospitalization:  No - Comment as needed Significant Relationships:  Spouse Lives with:  Spouse Do you feel safe going back to the place where you live?  Yes Need for family participation in patient care:  Yes (Comment)  Care giving concerns:  No care giving concerns at the time of assessment.    Social Worker assessment / plan:  LCSW following for SNF placement.   LCSW met with patient at bedside. No family present.   Patient reports that she lives at home with her spouse. Patient stated that she is independent in her ADLs at baseline. Patient is still driving and reports that she has assistance with cleaning once a month. Patient reports that she does not have stairs in the home and she was ambulating independently prior to hospital stay.   At the time of assessment patient refused SNF. Patient reports that she was in SNF for rehab when she had a knee replacement a few years ago in [place].She stated she was discharged from SNF with home health and did well. Patient states she feels she can manage at home with home health.  PLAN: Patient prefers home health at dc. LCSW notified RNCM.   Employment status:  Retired Insurance information:  Managed Medicare PT Recommendations:  Skilled Nursing Facility, Home with Home Health Information / Referral to community resources:      Patient/Family's Response to care:  Patient asked LCSW questions regarding home health. LCSW answered questions and explained that RNCM would assist her with HH services. Patient thankful for LCSW visit.    Patient/Family's Understanding of and Emotional Response to Diagnosis, Current Treatment, and Prognosis:  Patient is understanding of diagnosis. Patient feels that she can rehab at home with home health. Patient is not agreeable to SNF at time of assessment.   Emotional Assessment Appearance:  Appears stated age Attitude/Demeanor/Rapport:    Affect (typically observed):  Calm, Pleasant Orientation:  Oriented to Self, Oriented to Place, Oriented to  Time, Oriented to Situation Alcohol / Substance use:  Not Applicable Psych involvement (Current and /or in the community):  No (Comment)  Discharge Needs  Concerns to be addressed:  No discharge needs identified Readmission within the last 30 days:  No Current discharge risk:  None Barriers to Discharge:  Continued Medical Work up    , LCSW 10/25/2017, 11:40 AM  

## 2017-10-25 NOTE — Progress Notes (Signed)
Pt discharging  home with [facility] at home for [facility] needs.

## 2017-10-26 DIAGNOSIS — J101 Influenza due to other identified influenza virus with other respiratory manifestations: Secondary | ICD-10-CM

## 2017-10-26 MED ORDER — CARVEDILOL 3.125 MG PO TABS: 3.1250 mg | ORAL_TABLET | Freq: Two times a day (BID) | ORAL | 0 refills | Status: DC

## 2017-10-26 MED ORDER — PREDNISONE 10 MG PO TABS: ORAL_TABLET | ORAL | 0 refills | Status: DC

## 2017-10-26 MED ORDER — CALCIUM CARBONATE ANTACID 500 MG PO CHEW: 1.0000 | CHEWABLE_TABLET | Freq: Three times a day (TID) | ORAL | Status: DC | PRN

## 2017-10-26 MED ORDER — DEXTROMETHORPHAN-GUAIFENESIN 5-100 MG/5ML PO LIQD: 10.0000 mL | Freq: Two times a day (BID) | ORAL | 0 refills | Status: DC | PRN

## 2017-10-26 MED ORDER — APIXABAN 5 MG PO TABS: 5.0000 mg | ORAL_TABLET | Freq: Two times a day (BID) | ORAL | 0 refills | Status: DC

## 2017-10-26 MED ORDER — ALBUTEROL SULFATE HFA 108 (90 BASE) MCG/ACT IN AERS
2.0000 | INHALATION_SPRAY | Freq: Four times a day (QID) | RESPIRATORY_TRACT | 0 refills | Status: DC | PRN
Start: 2017-10-26 — End: 2020-02-21

## 2017-10-26 NOTE — Discharge Summary (Signed)
Physician Discharge Summary  Leah Velasquez  BMW:[id number]  DOB: 07/10/1941  DOA: 10/21/2017 PCP: [name], MD  Admit date: 10/21/2017 Discharge date: 10/26/2017  Admitted From: Home  Disposition:  Home   Recommendations for Outpatient Follow-up:  1. Follow up with PCP in 1-2 weeks 2. Please obtain BMP/CBC in 1-2 week   Home Health: PT/OT   Discharge Condition: Stable CODE STATUS: Full code Diet recommendation: Heart Healthy  Brief/Interim Summary: For full details see H&P/Progress note, but in brief, Leah Velasquez is a 77 year old female with medical history of hypertension, lung cancer who presented to the emergency department with productive cough, generalized weakness, palpitations diarrhea and fever.  On initial evaluation patient was found to be hypoxemic, was found to have influenza A and was admitted with acute  COPD exacerbation.  Patient was started on Tamiflu, IV steroids and azithromycin for COPD coverage.  Oxygen supplementation was successfully weaned off. Patient subsequently improved and she was deemed stable for discharge  Subjective: Patient seen and examined, breathing has significantly improved.  Still some wheezing and cough mainly at night.  Denies chest pain and shortness of breath.  Remains afebrile  Discharge Diagnoses/Hospital Course:  Acute respiratory failure with hypoxia due to influenza A complicated with viral pneumonia and acute COPD exacerbation/bronchitis Successfully weaned off O2. Completed course of Tamiflu and azithromycin. Will continue supportive treatment with bronchodilators and steroid taper. Will continue  for cough  Follow-up with PCP - will needPFTs at some point  A. Fib -  new diagnosis Currently rate controlled with Coreg, on Eliquis for anticoagulation. Chest x-ray on admission showed possible volume overload for which she was given IV Lasix on 3/5, respiration significantly improved after this.  Echocardiogram showed preserved left  ventricular function Follow-up with cardiology or PCP as an outpatient.  Hypertension Blood pressure well controlled Continue losartan and Coreg  Morbid obesity BMI 47 Follow-up as an outpatient  All other chronic medical condition were stable during the hospitalization.  Patient was seen by physical therapy, recommending home health PT On the day of the discharge the patient's vitals were stable, and no other acute medical condition were reported by patient. the patient was felt safe to be discharge to home  Discharge Instructions  You were cared for by a hospitalist during your hospital stay. If you have any questions about your discharge medications or the care you received while you were in the hospital after you are discharged, you can call the unit and asked to speak with the hospitalist on call if the hospitalist that took care of you is not available. Once you are discharged, your primary care physician will handle any further medical issues. Please note that NO REFILLS for any discharge medications will be authorized once you are discharged, as it is imperative that you return to your primary care physician (or establish a relationship with a primary care physician if you do not have one) for your aftercare needs so that they can reassess your need for medications and monitor your lab values.  Discharge Instructions    Call MD for:  difficulty breathing, headache or visual disturbances   Complete by:  As directed    Call MD for:  extreme fatigue   Complete by:  As directed    Call MD for:  hives   Complete by:  As directed    Call MD for:  persistant dizziness or light-headedness   Complete by:  As directed    Call MD for:  persistant nausea  and vomiting   Complete by:  As directed    Call MD for:  redness, tenderness, or signs of infection (pain, swelling, redness, odor or green/yellow discharge around incision site)   Complete by:  As directed    Call MD for:  severe  uncontrolled pain   Complete by:  As directed    Call MD for:  temperature >100.4   Complete by:  As directed    Diet - low sodium heart healthy   Complete by:  As directed    Increase activity slowly   Complete by:  As directed      Allergies as of 10/26/2017      Reactions   Morphine Anaphylaxis   Sulfa Antibiotics Rash      Medication List    STOP taking these medications   azithromycin 250 MG tablet Commonly known as:  ZITHROMAX   dextromethorphan-guaiFENesin 30-600 MG 12hr tablet Commonly known as:  MUCINEX DM Replaced by:  Dextromethorphan-guaiFENesin 5-100 MG/5ML Liqd     TAKE these medications   albuterol 108 (90 Base) MCG/ACT inhaler Commonly known as:  PROVENTIL HFA;VENTOLIN HFA Inhale 2 puffs into the lungs every 6 (six) hours as needed for wheezing or shortness of breath.   apixaban 5 MG Tabs tablet Commonly known as:  ELIQUIS Take 1 tablet (5 mg total) by mouth 2 (two) times daily.   calcium carbonate 500 MG chewable tablet Commonly known as:  TUMS - dosed in mg elemental calcium Chew 1 tablet (200 mg of elemental calcium total) by mouth 3 (three) times daily as needed for indigestion or heartburn.   carvedilol 3.125 MG tablet Commonly known as:  COREG Take 1 tablet (3.125 mg total) by mouth 2 (two) times daily with a meal.   COLCRYS 0.6 MG tablet Generic drug:  colchicine Take 0.6 mg by mouth every other day.   Dextromethorphan-guaiFENesin 5-100 MG/5ML Liqd Take 10 mLs by mouth every 12 (twelve) hours as needed. Replaces:  dextromethorphan-guaiFENesin 30-600 MG 12hr tablet   fluticasone 50 MCG/ACT nasal spray Commonly known as:  FLONASE Place 1 spray into both nostrils daily.   furosemide 20 MG tablet Commonly known as:  LASIX Take 20 mg by mouth daily as needed for edema.   levocetirizine 5 MG tablet Commonly known as:  XYZAL Take 5 mg by mouth daily.   losartan 100 MG tablet Commonly known as:  COZAAR Take 100 mg by mouth daily.    predniSONE 10 MG tablet Commonly known as:  DELTASONE Take 4 tablets for 3 days; Take 3 tablets for 4 days; Take 2 tablets for 3 days; Take 1 tablet for 4 days   simvastatin 20 MG tablet Commonly known as:  ZOCOR Take 20 mg by mouth at bedtime.   timolol 0.5 % ophthalmic solution Commonly known as:  TIMOPTIC Place 1 drop into both eyes 2 (two) times daily.   traMADol 50 MG tablet Commonly known as:  ULTRAM Take 50 mg by mouth daily.   zolpidem 5 MG tablet Commonly known as:  AMBIEN Take 5 mg by mouth at bedtime.            Durable Medical Equipment  (From admission, onward)        Start     Ordered   10/25/17 1509  For home use only DME 4 wheeled rolling walker with seat  Once    Question:  Patient needs a walker to treat with the following condition  Answer:  Balance problem   10/25/17  1509      Allergies  Allergen Reactions  . Morphine Anaphylaxis  . Sulfa Antibiotics Rash    Consultations:  None    Procedures/Studies: Dg Chest 2 View  Result Date: 10/23/2017 CLINICAL DATA:  Weakness. History of lung cancer, hypertension and morbid obesity. Productive cough, palpitations, diarrhea and fever. EXAM: CHEST  2 VIEW COMPARISON:  Chest x-ray dated 10/21/2017. FINDINGS: Mild cardiomegaly. 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 Aortic atherosclerosis. Bibasilar opacities are stable, suspected atelectasis and/or small pleural effusions. No new lung findings. No acute or suspicious osseous finding. IMPRESSION: 1. Stable chest x-ray. Bibasilar opacities, most likely atelectasis and/or small pleural effusions, pneumonia not excluded given the history of fever. 2. Stable cardiomegaly. 3. 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 Electronically Signed   By: Franki Cabot M.D.   On: 10/23/2017 16:26   Dg Chest 2 View  Result Date: 10/21/2017 CLINICAL DATA:  Pt c/o diffuse back and leg pain, worse at hip s/p slip,  fall from bed (pt states she slipped from her satin sheets) this am. EXAM: CHEST  2 VIEW COMPARISON:  None. FINDINGS: Cardiac silhouette is normal in size. No mediastinal or hilar masses. There is hazy opacity at the lung bases, right greater than left. On the right, the silhouettes the hemidiaphragm. This may reflect a combination of atelectasis with a small effusion on the right and atelectasis on the left. Pneumonia should be considered likely if there are consistent clinical symptoms. No convincing pulmonary edema. No pneumothorax. There are old healed right rib fractures. Skeletal structures are demineralized. IMPRESSION: 1. Right greater than left lung base opacity. This may reflect pneumonia. Alternatively, this may be due to atelectasis, with a possible pleural effusion on the right. Electronically Signed   By: Lajean Manes M.D.   On: 10/21/2017 07:26   Dg Knee Complete 4 Views Right  Result Date: 10/21/2017 CLINICAL DATA:  Pt c/o diffuse back and leg pain, worse at hip s/p slip, fall from bed (pt states she slipped from her satin sheets) this am. EXAM: RIGHT KNEE - COMPLETE 4+ VIEW COMPARISON:  None. FINDINGS: No fracture.  No bone lesion. The knee prosthetic components appear well seated and well aligned. Soft tissues are unremarkable. IMPRESSION: 1. No fracture or dislocation. 2. No evidence of loosening of the orthopedic hardware. Electronically Signed   By: Lajean Manes M.D.   On: 10/21/2017 07:24   Dg Hip Unilat  With Pelvis 2-3 Views Right  Result Date: 10/21/2017 CLINICAL DATA:  Pt c/o diffuse leg pain, worse at hip s/p slip, fall from bed (pt states she slipped from her satin sheets) this am. EXAM: DG HIP (WITH OR WITHOUT PELVIS) 2-3V RIGHT COMPARISON:  None. FINDINGS: No fracture.  No bone lesion. There is mild bilateral concentric hip joint space narrowing. No other arthropathic change. SI joints and symphysis pubis are normally aligned. Soft tissues are unremarkable. IMPRESSION: 1. No  fracture or dislocation.  No acute finding. Electronically Signed   By: Lajean Manes M.D.   On: 10/21/2017 07:23   ECHO  ------------------------------------------------------------------- Study Conclusions  - Left ventricle: The cavity size was normal. There was mild   concentric hypertrophy. Systolic function was normal. The   estimated ejection fraction was in the range of 60% to 65%. Wall   motion was normal; there were no regional wall motion   abnormalities. - Aortic valve: There was no regurgitation. - Mitral valve: There was trivial regurgitation. - Right ventricle: Systolic function  was normal. - Right atrium: The atrium was normal in size. - Tricuspid valve: There was no regurgitation. - Pulmonary arteries: Systolic pressure could not be accurately   estimated. - Inferior vena cava: The vessel was normal in size. - Pericardium, extracardiac: There was no pericardial effusion.  Discharge Exam: Vitals:   10/26/17 0534 10/26/17 0832  BP: (!) 156/85   Pulse: 80   Resp: 20   Temp: 98.1 F (36.7 C)   SpO2: 99% 97%   Vitals:   10/25/17 2123 10/26/17 0500 10/26/17 0534 10/26/17 0832  BP: 128/81  (!) 156/85   Pulse: 67  80   Resp: 18  20   Temp: 97.8 F (36.6 C)  98.1 F (36.7 C)   TempSrc: Oral  Oral   SpO2: 97%  99% 97%  Weight:  117.4 kg (258 lb 13.1 oz)    Height:        General: Pt is alert, awake, not in acute distress Cardiovascular: RRR, S1/S2 +, no rubs, no gallops Respiratory: Bronchia sounds, diffuse mild rhonchi, no wheezing Abdominal: Soft, NT, ND, bowel sounds + Extremities: no edema  The results of significant diagnostics from this hospitalization (including imaging, microbiology, ancillary and laboratory) are listed below for reference.     Microbiology: No results found for this or any previous visit (from the past 240 hour(s)).   Labs: BNP (last 3 results) Recent Labs    10/21/17 2239  BNP 63.8   Basic Metabolic Panel: Recent Labs   Lab 10/21/17 2239 10/22/17 0540 10/23/17 0621 10/24/17 0634 10/25/17 0644  NA 132* 132* 136 136 134*  K 4.1 3.8 3.9 3.8 3.9  CL 100* 98* 101 101 93*  CO2 25 26 28 27 31   GLUCOSE 115* 101* 113* 136* 179*  BUN 15 14 17 14 17   CREATININE 0.96 0.91 0.85 0.78 0.73  CALCIUM 8.7* 8.4* 8.6* 8.1* 8.9   Liver Function Tests: Recent Labs  Lab 10/21/17 2239  AST 48*  ALT 38  ALKPHOS 83  BILITOT 0.7  PROT 6.8  ALBUMIN 3.2*   No results for input(s): LIPASE, AMYLASE in the last 168 hours. No results for input(s): AMMONIA in the last 168 hours. CBC: Recent Labs  Lab 10/21/17 0647 10/22/17 0540 10/23/17 0621  WBC 5.7 4.7 7.1  NEUTROABS 4.8  --  5.4  HGB 12.9 13.0 13.1  HCT 38.7 39.8 40.4  MCV 96.3 97.8 97.3  PLT 139* 139* 143*   Cardiac Enzymes: Recent Labs  Lab 10/21/17 0647  CKTOTAL 41   BNP: Invalid input(s): POCBNP CBG: No results for input(s): GLUCAP in the last 168 hours. D-Dimer No results for input(s): DDIMER in the last 72 hours. Hgb A1c No results for input(s): HGBA1C in the last 72 hours. Lipid Profile No results for input(s): CHOL, HDL, LDLCALC, TRIG, CHOLHDL, LDLDIRECT in the last 72 hours. Thyroid function studies No results for input(s): TSH, T4TOTAL, T3FREE, THYROIDAB in the last 72 hours.  Invalid input(s): FREET3 Anemia work up No results for input(s): VITAMINB12, FOLATE, FERRITIN, TIBC, IRON, RETICCTPCT in the last 72 hours. Urinalysis No results found for: COLORURINE, APPEARANCEUR, LABSPEC, [place], GLUCOSEU, HGBUR, BILIRUBINUR, KETONESUR, PROTEINUR, UROBILINOGEN, NITRITE, LEUKOCYTESUR Sepsis Labs Invalid input(s): PROCALCITONIN,  WBC,  LACTICIDVEN Microbiology No results found for this or any previous visit (from the past 240 hour(s)).   Time coordinating discharge: 32 minutes  SIGNED:  Chipper Oman, MD  [facility] 10/26/2017, 11:57 AM  Pager please text page via  [email or web address]  Note - This  record has been created using  [employer]. Chart creation errors have been sought, but may not always have been located. Such creation errors do not reflect on the standard of medical care.

## 2017-11-24 ENCOUNTER — Encounter: Payer: Self-pay | Admitting: Cardiovascular Disease

## 2017-11-24 ENCOUNTER — Ambulatory Visit: Payer: Medicare HMO | Admitting: Cardiovascular Disease

## 2017-11-24 DIAGNOSIS — I481 Persistent atrial fibrillation: Secondary | ICD-10-CM

## 2017-11-24 DIAGNOSIS — I4819 Other persistent atrial fibrillation: Secondary | ICD-10-CM

## 2017-11-24 MED ORDER — APIXABAN 5 MG PO TABS: 5.0000 mg | ORAL_TABLET | Freq: Two times a day (BID) | ORAL | 6 refills | Status: DC

## 2017-11-24 MED ORDER — LOSARTAN POTASSIUM 50 MG PO TABS: 50.0000 mg | ORAL_TABLET | Freq: Every day | ORAL | 6 refills | Status: DC

## 2017-11-24 MED ORDER — CARVEDILOL 3.125 MG PO TABS: 3.1250 mg | ORAL_TABLET | Freq: Two times a day (BID) | ORAL | 6 refills | Status: DC

## 2017-11-24 NOTE — Patient Instructions (Signed)
Medication Instructions: Your physician recommends that you continue on your current medications as directed. Please refer to the Current Medication list given to you today.  Decrease Losartan to 50 mg daily.  START Carvedilol 3.125 mg twice daily.  START Eliquis (Apixaban) 5 mg twice daily.   Follow-Up: We request that you follow-up in: 2 weeks with PharmD and in 1 month with Dr Leah Velasquez will receive a reminder letter in the mail two months in advance. If you don't receive a letter, please call our office to schedule the follow-up appointment.  If you need a refill on your cardiac medications before your next appointment, please call your pharmacy.

## 2017-11-24 NOTE — Assessment & Plan Note (Signed)
History of new-onset A. Fib during her recent consultation month ago in the setting of influenza and pneumonia. She was placed on carvedilol for rate control as well as Eliquis for stroke prophylaxis.The CHA2DSVASC2 score is 3  .she had partially stopped relatively several days ago because of cost. Her EF was normal by 2-D echo. I'm going to restart oral anticoagulation as well as rate control and arranged to see her back in one month at which point we will discuss outpatient DC cardioversion for restoration of sinus rhythm.

## 2017-11-24 NOTE — Assessment & Plan Note (Signed)
History of essential hypertension blood pressure was 140/70. She is on losartan 100 mg a day. I'm going to decrease this to 50 mg a day and add low-dose carvedilol for heart rate control.

## 2017-11-24 NOTE — Progress Notes (Signed)
11/24/2017 Leah Velasquez   Jan 03, 1941  [id number]  Primary Physician Doreene Nest, Jannifer Rodney, MD Primary Cardiologist: Lorretta Harp MD Leah Velasquez, [place]  HPI:  Leah Velasquez is a 77 y.o. severely overweight married Caucasian female mother of 4 biologic children and 2 stepchildren, grandmother to 2 grandchildren referred by Dr. Derrek Monaco for coronary vascular evaluation because of A. Fib. 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 She was hospitalized on 10/21/17 for 4 days because of influenza A, pneumonia/COPD. She did smoke 20 years ago. She's never had a heart attack or stroke. She developed A. Fib during her hospitalization was placed on carvedilol for rate control as well as Eliquis oral anticoagulation for stroke prophylaxis.she does feel somewhat weak and is walking with a walker now getting physical therapy. She stopped her oral anticoagulant 3 days ago because of cost and her PCP stopped her carvedilol because of hypotension.    Current Meds  Medication Sig  . albuterol (PROVENTIL HFA;VENTOLIN HFA) 108 (90 Base) MCG/ACT inhaler Inhale 2 puffs into the lungs every 6 (six) hours as needed for wheezing or shortness of breath.  . calcium carbonate (TUMS - DOSED IN MG ELEMENTAL CALCIUM) 500 MG chewable tablet Chew 1 tablet (200 mg of elemental calcium total) by mouth 3 (three) times daily as needed for indigestion or heartburn.  . colchicine (COLCRYS) 0.6 MG tablet Take 0.6 mg by mouth every other day.  Marland Kitchen Dextromethorphan-guaiFENesin 5-100 MG/5ML LIQD Take 10 mLs by mouth every 12 (twelve) hours as needed.  . fluticasone (FLONASE) 50 MCG/ACT nasal spray Place 1 spray into both nostrils daily.   . furosemide (LASIX) 20 MG tablet Take 20 mg by mouth daily as needed for edema.  Marland Kitchen levocetirizine (XYZAL) 5 MG tablet Take 5 mg by mouth daily.  Marland Kitchen losartan (COZAAR) 50 MG tablet Take 1 tablet (50 mg total) by mouth daily.  . simvastatin (ZOCOR) 20 MG tablet Take 20 mg by mouth at bedtime.  .  timolol (TIMOPTIC) 0.5 % ophthalmic solution Place 1 drop into both eyes 2 (two) times daily.  . traMADol (ULTRAM) 50 MG tablet Take 50 mg by mouth daily.  Marland Kitchen zolpidem (AMBIEN) 5 MG tablet Take 5 mg by mouth at bedtime.  . [DISCONTINUED] losartan (COZAAR) 100 MG tablet Take 100 mg by mouth daily.     Allergies  Allergen Reactions  . Morphine Anaphylaxis  . Sulfa Antibiotics Rash    Social History   Socioeconomic History  . Marital status: Married    Spouse name: Not on file  . Number of children: Not on file  . Years of education: Not on file  . Highest education level: Not on file  Occupational History  . Not on file  Social Needs  . Financial resource strain: Not on file  . Food insecurity:    Worry: Not on file    Inability: Not on file  . Transportation needs:    Medical: Not on file    Non-medical: Not on file  Tobacco Use  . Smoking status: Never Smoker  . Smokeless tobacco: Never Used  Substance and Sexual Activity  . Alcohol use: No    Frequency: Never  . Drug use: No  . Sexual activity: Not on file  Lifestyle  . Physical activity:    Days per week: Not on file    Minutes per session: Not on file  . Stress: Not on file  Relationships  . Social connections:    Talks on  phone: Not on file    Gets together: Not on file    Attends religious service: Not on file    Active member of club or organization: Not on file    Attends meetings of clubs or organizations: Not on file    Relationship status: Not on file  . Intimate partner violence:    Fear of current or ex partner: Not on file    Emotionally abused: Not on file    Physically abused: Not on file    Forced sexual activity: Not on file  Other Topics Concern  . Not on file  Social History Narrative  . Not on file     Review of Systems: General: negative for chills, fever, night sweats or weight changes.  Cardiovascular: negative for chest pain, dyspnea on exertion, edema, orthopnea, palpitations,  paroxysmal nocturnal dyspnea or shortness of breath Dermatological: negative for rash Respiratory: negative for cough or wheezing Urologic: negative for hematuria Abdominal: negative for nausea, vomiting, diarrhea, bright red blood per rectum, melena, or hematemesis Neurologic: negative for visual changes, syncope, or dizziness All other systems reviewed and are otherwise negative except as noted above.    Blood pressure 140/70, pulse (!) 101, height 5' 1.5" (1.562 m), weight 263 lb (119.3 kg).  General appearance: alert and no distress Neck: no adenopathy, no carotid bruit, no JVD, supple, symmetrical, trachea midline and thyroid not enlarged, symmetric, no tenderness/mass/nodules Lungs: clear to auscultation bilaterally Heart: irregularly irregular rhythm Extremities: extremities normal, atraumatic, no cyanosis or edema Pulses: 2+ and symmetric Skin: Skin color, texture, turgor normal. No rashes or lesions Neurologic: Alert and oriented X 3, normal strength and tone. Normal symmetric reflexes. Normal coordination and gait  EKG atrial fibrillation with a ventricular response of 101, low limb voltage and early R-wave transition and appears to be a incomplete right bundle branch block. I personally reviewed this EKG.  ASSESSMENT AND PLAN:   Essential hypertension History of essential hypertension blood pressure was 140/70. She is on losartan 100 mg a day. I'm going to decrease this to 50 mg a day and add low-dose carvedilol for heart rate control.  Atrial fibrillation ([place]) History of new-onset A. Fib during her recent consultation month ago in the setting of influenza and pneumonia. She was placed on carvedilol for rate control as well as Eliquis for stroke prophylaxis.The CHA2DSVASC2 score is 3  .she had partially stopped relatively several days ago because of cost. Her EF was normal by 2-D echo. I'm going to restart oral anticoagulation as well as rate control and arranged to see her  back in one month at which point we will discuss outpatient DC cardioversion for restoration of sinus rhythm.       Lorretta Harp MD FACP,FACC,FAHA, [facility] 11/24/2017 12:35 PM

## 2017-11-24 NOTE — Addendum Note (Signed)
Addended by: Zebedee Iba on: 11/24/2017 01:58 PM   Modules accepted: Orders

## 2017-12-05 ENCOUNTER — Telehealth: Payer: Self-pay | Admitting: Cardiovascular Disease

## 2017-12-05 NOTE — Telephone Encounter (Signed)
New Message:    Per pt states that Gwenlyn Found told her she would be having a shock treatment done. Pt would like someone to give her a call back on how to schedule this appt.

## 2017-12-05 NOTE — Telephone Encounter (Signed)
Return call to pt and advised  based on Dr. Gwenlyn Found notes from last visit, he was starting her on an oral anticoagulant and medication for rate control with a 1 month f/u visit. At f/u visit they would then discuss possible cardioversion but none has been ordered at this moment. Pt verbalized understanding and f/u appointment confirmed.

## 2017-12-15 ENCOUNTER — Encounter: Payer: Self-pay | Admitting: Pharmacist

## 2017-12-15 ENCOUNTER — Ambulatory Visit (INDEPENDENT_AMBULATORY_CARE_PROVIDER_SITE_OTHER): Payer: Medicare HMO | Admitting: Pharmacist

## 2017-12-15 VITALS — BP 118/72 | HR 86

## 2017-12-15 DIAGNOSIS — I10 Essential (primary) hypertension: Secondary | ICD-10-CM

## 2017-12-15 NOTE — Assessment & Plan Note (Signed)
Blood pressure well controlled today and HR also improved. Patient reports improvement sedation and shortness of breath as well.  We spent long time taking about medication indication and need for titration. Also discussed other anticoagulation options. Patient assistance paperwork for Eliquis and card 30 day free trial provided as well. Patient understand current expected time to stay on full anticoagulation and current medication if lifelong.   Will continue all medication as previously prescribed. Follow up with Dr Gwenlyn Found already schedule in 2 weeks. She is to follow up with HTN clinic as needed.

## 2017-12-15 NOTE — Patient Instructions (Signed)
Return for a  follow up appointment as needed  Check your blood pressure at home daily (if able) and keep record of the readings.  Take your BP meds as follows: *NO medication changes*  Bring all of your meds, your BP cuff and your record of home blood pressures to your next appointment.  Exercise as you're able, try to walk approximately 30 minutes per day.  Keep salt intake to a minimum, especially watch canned and prepared boxed foods.  Eat more fresh fruits and vegetables and fewer canned items.  Avoid eating in fast food restaurants.    HOW TO TAKE YOUR BLOOD PRESSURE: . Rest 5 minutes before taking your blood pressure. .  Don't smoke or drink caffeinated beverages for at least 30 minutes before. . Take your blood pressure before (not after) you eat. . Sit comfortably with your back supported and both feet on the floor (don't cross your legs). . Elevate your arm to heart level on a table or a desk. . Use the proper sized cuff. It should fit smoothly and snugly around your bare upper arm. There should be enough room to slip a fingertip under the cuff. The bottom edge of the cuff should be 1 inch above the crease of the elbow. . Ideally, take 3 measurements at one sitting and record the average.

## 2017-12-15 NOTE — Progress Notes (Signed)
Patient ID: Leah Velasquez                 DOB: 1941-01-16                      MRN: [id number]     HPI: Leah Velasquez is a 77 y.o. female referred by Dr. Gwenlyn Found to HTN clinic. PMH includes hypertension, atrial fibrillation, thrombocytopenia, and hyperlipidemia. During most recent OV with Dr Gwenlyn Found her losartan dose was decreased from 100mg  to 50mg  daily and carvedilol 3.125mg  twice daily was added to therapy. Patient presents today for initial HTN clinic evaluation and medication titration. Patient reports less fatigue, "less sleepy", and improvement in shortness or breath. Denies dizziness, swelling, headaches, chest pain, or any other ADR.  Current HTN meds: Carvedilol 3.125mg  twice daily Furosemide 20mg  daily Losartan 50mg  daily  BP goal: 130/80  Family History: mother and sister  Social History: no alcohol, forme smoker, no smokeless tobacco  Diet: 1 cup coffee in AM, 2 can of soda during the day  Exercise: PT 2x/weekly  Home BP readings: none available for assessment  Wt Readings from Last 3 Encounters:  11/24/17 263 lb (119.3 kg)  10/26/17 258 lb 13.1 oz (117.4 kg)   BP Readings from Last 3 Encounters:  12/15/17 118/72  11/24/17 140/70  10/26/17 (!) 156/85   Pulse Readings from Last 3 Encounters:  12/15/17 86  11/24/17 (!) 101  10/26/17 80    Past Medical History:  Diagnosis Date  . Cancer ([place])    lung cancer  . Hypertension     Current Outpatient Medications on File Prior to Visit  Medication Sig Dispense Refill  . albuterol (PROVENTIL HFA;VENTOLIN HFA) 108 (90 Base) MCG/ACT inhaler Inhale 2 puffs into the lungs every 6 (six) hours as needed for wheezing or shortness of breath. 1 Inhaler 0  . apixaban (ELIQUIS) 5 MG TABS tablet Take 1 tablet (5 mg total) by mouth 2 (two) times daily. 60 tablet 6  . calcium carbonate (TUMS - DOSED IN MG ELEMENTAL CALCIUM) 500 MG chewable tablet Chew 1 tablet (200 mg of elemental calcium total) by mouth 3 (three) times daily as needed  for indigestion or heartburn.    . carvedilol (COREG) 3.125 MG tablet Take 1 tablet (3.125 mg total) by mouth 2 (two) times daily with a meal. 60 tablet 6  . colchicine (COLCRYS) 0.6 MG tablet Take 0.6 mg by mouth every other day.    . fluticasone (FLONASE) 50 MCG/ACT nasal spray Place 1 spray into both nostrils daily.     . furosemide (LASIX) 20 MG tablet Take 20 mg by mouth daily as needed for edema.    Marland Kitchen levocetirizine (XYZAL) 5 MG tablet Take 5 mg by mouth daily.    Marland Kitchen losartan (COZAAR) 50 MG tablet Take 1 tablet (50 mg total) by mouth daily. 30 tablet 6  . omeprazole (PRILOSEC) 20 MG capsule Take 20 mg by mouth daily.    . simvastatin (ZOCOR) 20 MG tablet Take 20 mg by mouth at bedtime.    . timolol (TIMOPTIC) 0.5 % ophthalmic solution Place 1 drop into both eyes 2 (two) times daily.    . traMADol (ULTRAM) 50 MG tablet Take 50 mg by mouth daily.  0  . zolpidem (AMBIEN) 5 MG tablet Take 5 mg by mouth at bedtime.    Marland Kitchen Dextromethorphan-guaiFENesin 5-100 MG/5ML LIQD Take 10 mLs by mouth every 12 (twelve) hours as needed. (Patient not taking: Reported on 12/15/2017)  0  No current facility-administered medications on file prior to visit.     Allergies  Allergen Reactions  . Morphine Anaphylaxis  . Sulfa Antibiotics Rash    Blood pressure 118/72, pulse 86, SpO2 96 %.  Essential hypertension Blood pressure well controlled today and HR also improved. Patient reports improvement sedation and shortness of breath as well.  We spent long time taking about medication indication and need for titration. Also discussed other anticoagulation options. Patient assistance paperwork for Eliquis and card 30 day free trial provided as well. Patient understand current expected time to stay on full anticoagulation and current medication if lifelong.   Will continue all medication as previously prescribed. Follow up with Dr Gwenlyn Found already schedule in 2 weeks. She is to follow up with HTN clinic as  needed.   Henritta Mutz Rodriguez-Guzman PharmD, BCPS, [place] [address] 12/15/2017 1:47 PM

## 2017-12-18 ENCOUNTER — Other Ambulatory Visit: Payer: Self-pay | Admitting: Cardiovascular Disease

## 2017-12-18 MED ORDER — APIXABAN 5 MG PO TABS: 5.0000 mg | ORAL_TABLET | Freq: Two times a day (BID) | ORAL | 1 refills | Status: DC

## 2017-12-18 NOTE — Telephone Encounter (Signed)
New message:        *STAT* If patient is at the pharmacy, call can be transferred to refill team.   1. Which medications need to be refilled? (please list name of each medication and dose if known) apixaban (ELIQUIS) 5 MG TABS tablet  2. Which pharmacy/location (including street and city if local pharmacy) is medication to be sent to?[id number] - [place], [place] [address]  3. Do they need a 30 day or 90 day supply? 30     Pt states she was given a trial card for this medication but they told her she needed a prescription for it.

## 2017-12-20 ENCOUNTER — Ambulatory Visit: Payer: Medicare HMO | Admitting: Cardiovascular Disease

## 2017-12-21 ENCOUNTER — Telehealth: Payer: Self-pay | Admitting: Cardiovascular Disease

## 2017-12-21 MED ORDER — CARVEDILOL PHOSPHATE ER 10 MG PO CP24: 10.0000 mg | ORAL_CAPSULE | Freq: Every day | ORAL | 1 refills | Status: DC

## 2017-12-21 NOTE — Telephone Encounter (Signed)
Per pt is not tolerating Coreg 3.125 mg after taking morning dose has to go lay down and sleep  is wandering if could take something else or could try and take the Coreg CR  Was asking if could take both pills in evening Informed pt that this is twice a day med.Will forward to [place] for recommendations ./cy

## 2017-12-21 NOTE — Telephone Encounter (Signed)
Patient will like to try extended release Coreg to improve tolerability.  Carvedilol 3.125mg  rx discontinued. New Rx for Coreg 10mg  CR sent to prefer pharmacy.

## 2017-12-21 NOTE — Telephone Encounter (Addendum)
. °  Pt c/o medication issue: 1. Name of Medication: Coreg 2. How are you currently taking this medication (dosage and times per day)? 1 time in the morning and 1 time in the evening 3. Are you having a reaction (difficulty breathing--STAT)? no 4. What is your medication issue? Making her sleepy -cant stay awake

## 2017-12-22 ENCOUNTER — Telehealth: Payer: Self-pay | Admitting: Cardiovascular Disease

## 2017-12-22 NOTE — Telephone Encounter (Signed)
Pt c/o medication issue:  1. Name of Medication:  carvedilol (COREG CR) 10 MG 24 hr capsule    2. How are you currently taking this medication (dosage and times per day)? Take 1 capsule (10 mg total) by mouth daily.  3. Are you having a reaction (difficulty breathing--STAT)?  no 4. What is your medication issue? Pt medication was changed to a time release and she said that she now pays over 200.00 for the medication   Pt states that her insurance needs reason so that it can be covered

## 2017-12-25 NOTE — Telephone Encounter (Signed)
Can change carvedilol to metoprolol.  Have her come in to see Cyril Mourning to make the transition.

## 2017-12-26 ENCOUNTER — Encounter: Payer: Self-pay | Admitting: Cardiovascular Disease

## 2017-12-26 ENCOUNTER — Ambulatory Visit: Payer: Medicare HMO | Admitting: Cardiovascular Disease

## 2017-12-26 VITALS — BP 137/67 | HR 79 | Ht 61.0 in | Wt 265.4 lb

## 2017-12-26 DIAGNOSIS — I10 Essential (primary) hypertension: Secondary | ICD-10-CM | POA: Diagnosis not present

## 2017-12-26 DIAGNOSIS — E7800 Pure hypercholesterolemia, unspecified: Secondary | ICD-10-CM | POA: Diagnosis not present

## 2017-12-26 DIAGNOSIS — I481 Persistent atrial fibrillation: Secondary | ICD-10-CM | POA: Diagnosis not present

## 2017-12-26 DIAGNOSIS — I4819 Other persistent atrial fibrillation: Secondary | ICD-10-CM

## 2017-12-26 NOTE — Assessment & Plan Note (Signed)
History of atrial fibrillation rate controlled on Eliquis oral anticoagulation.  She appears to still be in A. fib.  I believe she would benefit from outpatient cardioversion for restoration of sinus rhythm and potentially ultimately discontinuing oral anti-coagulation.

## 2017-12-26 NOTE — Assessment & Plan Note (Signed)
History of essential hypertension her blood pressure measured today at 137/67.  She is on losartan carvedilol.  Continue current meds at current dosing

## 2017-12-26 NOTE — H&P (View-Only) (Signed)
12/26/2017 Leah Velasquez   May 13, 1941  [id number]  Primary Physician Doreene Nest, Jannifer Rodney, MD Primary Cardiologist: Lorretta Harp MD [name], [place]  HPI:  Leah Velasquez is a 77 y.o.  severely overweight married Caucasian female mother of 4 biologic children and 2 stepchildren, grandmother to 11 grandchildren referred by Dr. Derrek Monaco for coronary vascular evaluation because of A. Fib. 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  I last saw her in the office 11/24/2017.  She was hospitalized on 10/21/17 for 4 days because of influenza A, pneumonia/COPD. She did smoke 20 years ago. She's never had a heart attack or stroke. She developed A. Fib during her hospitalization was placed on carvedilol for rate control as well as Eliquis oral anticoagulation for stroke prophylaxis.she does feel somewhat weak and is walking with a walker now getting physical therapy.  I adjusted her losartan because of relative hypotension and her blood pressure seems to be improved.  She remains on Eliquis and carvedilol.  She says that she is feeling better.  Her atrial fibrillation occurred during a hospital station for pneumonia and type a influenza.    Current Meds  Medication Sig  . albuterol (PROVENTIL HFA;VENTOLIN HFA) 108 (90 Base) MCG/ACT inhaler Inhale 2 puffs into the lungs every 6 (six) hours as needed for wheezing or shortness of breath.  Marland Kitchen apixaban (ELIQUIS) 5 MG TABS tablet Take 1 tablet (5 mg total) by mouth 2 (two) times daily.  . calcium carbonate (TUMS - DOSED IN MG ELEMENTAL CALCIUM) 500 MG chewable tablet Chew 1 tablet (200 mg of elemental calcium total) by mouth 3 (three) times daily as needed for indigestion or heartburn.  . carvedilol (COREG CR) 10 MG 24 hr capsule Take 1 capsule (10 mg total) by mouth daily.  . colchicine (COLCRYS) 0.6 MG tablet Take 0.6 mg by mouth every other day.  Marland Kitchen Dextromethorphan-guaiFENesin 5-100 MG/5ML LIQD Take 10 mLs by mouth every 12 (twelve) hours as needed.  .  fluticasone (FLONASE) 50 MCG/ACT nasal spray Place 1 spray into both nostrils daily.   . furosemide (LASIX) 20 MG tablet Take 20 mg by mouth daily as needed for edema.  Marland Kitchen levocetirizine (XYZAL) 5 MG tablet Take 5 mg by mouth daily.  Marland Kitchen losartan (COZAAR) 50 MG tablet Take 1 tablet (50 mg total) by mouth daily.  Marland Kitchen omeprazole (PRILOSEC) 20 MG capsule Take 20 mg by mouth daily.  . simvastatin (ZOCOR) 20 MG tablet Take 20 mg by mouth at bedtime.  . timolol (TIMOPTIC) 0.5 % ophthalmic solution Place 1 drop into both eyes 2 (two) times daily.  . traMADol (ULTRAM) 50 MG tablet Take 50 mg by mouth daily.  Marland Kitchen zolpidem (AMBIEN) 5 MG tablet Take 5 mg by mouth at bedtime.     Allergies  Allergen Reactions  . Morphine Anaphylaxis  . Sulfa Antibiotics Rash    Social History   Socioeconomic History  . Marital status: Married    Spouse name: Not on file  . Number of children: Not on file  . Years of education: Not on file  . Highest education level: Not on file  Occupational History  . Not on file  Social Needs  . Financial resource strain: Not on file  . Food insecurity:    Worry: Not on file    Inability: Not on file  . Transportation needs:    Medical: Not on file    Non-medical: Not on file  Tobacco Use  . Smoking status:  Never Smoker  . Smokeless tobacco: Never Used  Substance and Sexual Activity  . Alcohol use: No    Frequency: Never  . Drug use: No  . Sexual activity: Not on file  Lifestyle  . Physical activity:    Days per week: Not on file    Minutes per session: Not on file  . Stress: Not on file  Relationships  . Social connections:    Talks on phone: Not on file    Gets together: Not on file    Attends religious service: Not on file    Active member of club or organization: Not on file    Attends meetings of clubs or organizations: Not on file    Relationship status: Not on file  . Intimate partner violence:    Fear of current or ex partner: Not on file     Emotionally abused: Not on file    Physically abused: Not on file    Forced sexual activity: Not on file  Other Topics Concern  . Not on file  Social History Narrative  . Not on file     Review of Systems: General: negative for chills, fever, night sweats or weight changes.  Cardiovascular: negative for chest pain, dyspnea on exertion, edema, orthopnea, palpitations, paroxysmal nocturnal dyspnea or shortness of breath Dermatological: negative for rash Respiratory: negative for cough or wheezing Urologic: negative for hematuria Abdominal: negative for nausea, vomiting, diarrhea, bright red blood per rectum, melena, or hematemesis Neurologic: negative for visual changes, syncope, or dizziness All other systems reviewed and are otherwise negative except as noted above.    Blood pressure 137/67, pulse 79, height 5\' 1"  (1.549 m), weight 265 lb 6.4 oz (120.4 kg).  General appearance: alert and no distress Neck: no adenopathy, no carotid bruit, no JVD, supple, symmetrical, trachea midline and thyroid not enlarged, symmetric, no tenderness/mass/nodules Lungs: clear to auscultation bilaterally Heart: irregularly irregular rhythm Extremities: extremities normal, atraumatic, no cyanosis or edema Pulses: 2+ and symmetric Skin: Skin color, texture, turgor normal. No rashes or lesions Neurologic: Alert and oriented X 3, normal strength and tone. Normal symmetric reflexes. Normal coordination and gait  EKG atrial fibrillation with a ventricular response of 83 and low limb voltage.  I personally reviewed this EKG.  ASSESSMENT AND PLAN:   Atrial fibrillation ([facility]) History of atrial fibrillation rate controlled on Eliquis oral anticoagulation.  She appears to still be in A. fib.  I believe she would benefit from outpatient cardioversion for restoration of sinus rhythm and potentially ultimately discontinuing oral anti-coagulation.  Essential hypertension History of essential hypertension her  blood pressure measured today at 137/67.  She is on losartan carvedilol.  Continue current meds at current dosing  Hyperlipidemia She of hyperlipidemia on statin therapy      Lorretta Harp MD [facility], [facility] 12/26/2017 11:32 AM

## 2017-12-26 NOTE — Progress Notes (Signed)
12/26/2017 Leah Velasquez   18-Oct-1940  [id number]  Primary Physician Leah Velasquez, Leah Rodney, MD Primary Cardiologist: Leah Harp MD Leah Velasquez, [place]  HPI:  Leah Velasquez is a 77 y.o.  severely overweight married Caucasian female mother of 4 biologic children and 2 stepchildren, grandmother to 72 grandchildren referred by Dr. Derrek Velasquez for coronary vascular evaluation because of A. Fib. 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  I last saw her in the office 11/24/2017.  She was hospitalized on 10/21/17 for 4 days because of influenza A, pneumonia/COPD. She did smoke 20 years ago. She's never had a heart attack or stroke. She developed A. Fib during her hospitalization was placed on carvedilol for rate control as well as Eliquis oral anticoagulation for stroke prophylaxis.she does feel somewhat weak and is walking with a walker now getting physical therapy.  I adjusted her losartan because of relative hypotension and her blood pressure seems to be improved.  She remains on Eliquis and carvedilol.  She says that she is feeling better.  Her atrial fibrillation occurred during a hospital station for pneumonia and type a influenza.    Current Meds  Medication Sig  . albuterol (PROVENTIL HFA;VENTOLIN HFA) 108 (90 Base) MCG/ACT inhaler Inhale 2 puffs into the lungs every 6 (six) hours as needed for wheezing or shortness of breath.  Marland Kitchen apixaban (ELIQUIS) 5 MG TABS tablet Take 1 tablet (5 mg total) by mouth 2 (two) times daily.  . calcium carbonate (TUMS - DOSED IN MG ELEMENTAL CALCIUM) 500 MG chewable tablet Chew 1 tablet (200 mg of elemental calcium total) by mouth 3 (three) times daily as needed for indigestion or heartburn.  . carvedilol (COREG CR) 10 MG 24 hr capsule Take 1 capsule (10 mg total) by mouth daily.  . colchicine (COLCRYS) 0.6 MG tablet Take 0.6 mg by mouth every other day.  Marland Kitchen Dextromethorphan-guaiFENesin 5-100 MG/5ML LIQD Take 10 mLs by mouth every 12 (twelve) hours as needed.  .  fluticasone (FLONASE) 50 MCG/ACT nasal spray Place 1 spray into both nostrils daily.   . furosemide (LASIX) 20 MG tablet Take 20 mg by mouth daily as needed for edema.  Marland Kitchen levocetirizine (XYZAL) 5 MG tablet Take 5 mg by mouth daily.  Marland Kitchen losartan (COZAAR) 50 MG tablet Take 1 tablet (50 mg total) by mouth daily.  Marland Kitchen omeprazole (PRILOSEC) 20 MG capsule Take 20 mg by mouth daily.  . simvastatin (ZOCOR) 20 MG tablet Take 20 mg by mouth at bedtime.  . timolol (TIMOPTIC) 0.5 % ophthalmic solution Place 1 drop into both eyes 2 (two) times daily.  . traMADol (ULTRAM) 50 MG tablet Take 50 mg by mouth daily.  Marland Kitchen zolpidem (AMBIEN) 5 MG tablet Take 5 mg by mouth at bedtime.     Allergies  Allergen Reactions  . Morphine Anaphylaxis  . Sulfa Antibiotics Rash    Social History   Socioeconomic History  . Marital status: Married    Spouse name: Not on file  . Number of children: Not on file  . Years of education: Not on file  . Highest education level: Not on file  Occupational History  . Not on file  Social Needs  . Financial resource strain: Not on file  . Food insecurity:    Worry: Not on file    Inability: Not on file  . Transportation needs:    Medical: Not on file    Non-medical: Not on file  Tobacco Use  . Smoking status:  Never Smoker  . Smokeless tobacco: Never Used  Substance and Sexual Activity  . Alcohol use: No    Frequency: Never  . Drug use: No  . Sexual activity: Not on file  Lifestyle  . Physical activity:    Days per week: Not on file    Minutes per session: Not on file  . Stress: Not on file  Relationships  . Social connections:    Talks on phone: Not on file    Gets together: Not on file    Attends religious service: Not on file    Active member of club or organization: Not on file    Attends meetings of clubs or organizations: Not on file    Relationship status: Not on file  . Intimate partner violence:    Fear of current or ex partner: Not on file     Emotionally abused: Not on file    Physically abused: Not on file    Forced sexual activity: Not on file  Other Topics Concern  . Not on file  Social History Narrative  . Not on file     Review of Systems: General: negative for chills, fever, night sweats or weight changes.  Cardiovascular: negative for chest pain, dyspnea on exertion, edema, orthopnea, palpitations, paroxysmal nocturnal dyspnea or shortness of breath Dermatological: negative for rash Respiratory: negative for cough or wheezing Urologic: negative for hematuria Abdominal: negative for nausea, vomiting, diarrhea, bright red blood per rectum, melena, or hematemesis Neurologic: negative for visual changes, syncope, or dizziness All other systems reviewed and are otherwise negative except as noted above.    Blood pressure 137/67, pulse 79, height 5\' 1"  (1.549 m), weight 265 lb 6.4 oz (120.4 kg).  General appearance: alert and no distress Neck: no adenopathy, no carotid bruit, no JVD, supple, symmetrical, trachea midline and thyroid not enlarged, symmetric, no tenderness/mass/nodules Lungs: clear to auscultation bilaterally Heart: irregularly irregular rhythm Extremities: extremities normal, atraumatic, no cyanosis or edema Pulses: 2+ and symmetric Skin: Skin color, texture, turgor normal. No rashes or lesions Neurologic: Alert and oriented X 3, normal strength and tone. Normal symmetric reflexes. Normal coordination and gait  EKG atrial fibrillation with a ventricular response of 83 and low limb voltage.  I personally reviewed this EKG.  ASSESSMENT AND PLAN:   Atrial fibrillation (HCC) History of atrial fibrillation rate controlled on Eliquis oral anticoagulation.  She appears to still be in A. fib.  I believe she would benefit from outpatient cardioversion for restoration of sinus rhythm and potentially ultimately discontinuing oral anti-coagulation.  Essential hypertension History of essential hypertension her  blood pressure measured today at 137/67.  She is on losartan carvedilol.  Continue current meds at current dosing  Hyperlipidemia She of hyperlipidemia on statin therapy      Leah Harp MD [facility], [facility] 12/26/2017 11:32 AM

## 2017-12-26 NOTE — Assessment & Plan Note (Signed)
She of hyperlipidemia on statin therapy

## 2017-12-26 NOTE — Patient Instructions (Signed)
Medication Instructions: Your physician recommends that you continue on your current medications as directed. Please refer to the Current Medication list given to you today.  Labwork: Your physician recommends that you return for lab work today.   Testing/Procedures: Your physician has recommended that you have a Cardioversion (DCCV) in 1-2 weeks. Electrical Cardioversion uses a jolt of electricity to your heart either through paddles or wired patches attached to your chest. This is a controlled, usually prescheduled, procedure. Defibrillation is done under light anesthesia in the hospital, and you usually go home the day of the procedure. This is done to get your heart back into a normal rhythm. You are not awake for the procedure. Please see the instruction sheet given to you today.  Follow-Up: Your physician recommends that you schedule a follow-up appointment in: 3 months with Dr. Gwenlyn Found.  If you need a refill on your cardiac medications before your next appointment, please call your pharmacy.

## 2017-12-27 LAB — CBC WITH DIFFERENTIAL/PLATELET
Basophils Absolute: 0 10*3/uL (ref 0.0–0.2)
Basos: 1 %
EOS (ABSOLUTE): 0.2 10*3/uL (ref 0.0–0.4)
Eos: 2 %
Hematocrit: 40 % (ref 34.0–46.6)
Hemoglobin: 13.7 g/dL (ref 11.1–15.9)
Immature Grans (Abs): 0 10*3/uL (ref 0.0–0.1)
Immature Granulocytes: 0 %
Lymphocytes Absolute: 1.4 10*3/uL (ref 0.7–3.1)
Lymphs: 23 %
MCH: 32.5 pg (ref 26.6–33.0)
MCHC: 34.3 g/dL (ref 31.5–35.7)
MCV: 95 fL (ref 79–97)
Monocytes Absolute: 0.7 10*3/uL (ref 0.1–0.9)
Monocytes: 12 %
Neutrophils Absolute: 3.8 10*3/uL (ref 1.4–7.0)
Neutrophils: 62 %
Platelets: 204 10*3/uL (ref 150–379)
RBC: 4.22 x10E6/uL (ref 3.77–5.28)
RDW: 14.1 % (ref 12.3–15.4)
WBC: 6.2 10*3/uL (ref 3.4–10.8)

## 2017-12-27 LAB — BASIC METABOLIC PANEL
BUN/Creatinine Ratio: 17 (ref 12–28)
BUN: 13 mg/dL (ref 8–27)
CALCIUM: 9.5 mg/dL (ref 8.7–10.3)
CHLORIDE: 98 mmol/L (ref 96–106)
CO2: 23 mmol/L (ref 20–29)
Creatinine, Ser: 0.78 mg/dL (ref 0.57–1.00)
GFR calc non Af Amer: 75 mL/min/{1.73_m2} (ref >59)
GFR, EST AFRICAN AMERICAN: 87 mL/min/{1.73_m2} (ref >59)
Glucose: 145 mg/dL — ABNORMAL HIGH (ref 65–99)
Potassium: 4.1 mmol/L (ref 3.5–5.2)
SODIUM: 140 mmol/L (ref 134–144)

## 2017-12-28 ENCOUNTER — Encounter: Payer: Self-pay | Admitting: Cardiovascular Disease

## 2018-01-02 ENCOUNTER — Other Ambulatory Visit: Payer: Self-pay | Admitting: Cardiovascular Disease

## 2018-01-02 ENCOUNTER — Encounter: Payer: Self-pay | Admitting: *Deleted

## 2018-01-02 NOTE — Telephone Encounter (Signed)
Left message for pt to call.

## 2018-01-09 ENCOUNTER — Encounter (HOSPITAL_COMMUNITY): Payer: Self-pay | Admitting: *Deleted

## 2018-01-09 ENCOUNTER — Ambulatory Visit (HOSPITAL_COMMUNITY)
Admission: RE | Admit: 2018-01-09 | Discharge: 2018-01-09 | Disposition: A | Discharge status: Home / Self Care | Payer: Medicare HMO | Source: Ambulatory Visit | Attending: Cardiovascular Disease | Admitting: Cardiovascular Disease

## 2018-01-09 ENCOUNTER — Encounter (HOSPITAL_COMMUNITY)
Admission: RE | Disposition: A | Discharge status: Home / Self Care | Payer: Self-pay | Source: Ambulatory Visit | Attending: Cardiovascular Disease

## 2018-01-09 ENCOUNTER — Other Ambulatory Visit: Payer: Self-pay | Admitting: Cardiovascular Disease

## 2018-01-09 ENCOUNTER — Ambulatory Visit (HOSPITAL_COMMUNITY): Payer: Medicare HMO | Admitting: Certified Registered Nurse Anesthetist

## 2018-01-09 DIAGNOSIS — J449 Chronic obstructive pulmonary disease, unspecified: Secondary | ICD-10-CM | POA: Diagnosis not present

## 2018-01-09 DIAGNOSIS — Z885 Allergy status to narcotic agent status: Secondary | ICD-10-CM | POA: Insufficient documentation

## 2018-01-09 DIAGNOSIS — I959 Hypotension, unspecified: Secondary | ICD-10-CM | POA: Diagnosis not present

## 2018-01-09 DIAGNOSIS — Z79899 Other long term (current) drug therapy: Secondary | ICD-10-CM | POA: Insufficient documentation

## 2018-01-09 DIAGNOSIS — I10 Essential (primary) hypertension: Secondary | ICD-10-CM | POA: Diagnosis not present

## 2018-01-09 DIAGNOSIS — Z7901 Long term (current) use of anticoagulants: Secondary | ICD-10-CM | POA: Diagnosis not present

## 2018-01-09 DIAGNOSIS — I481 Persistent atrial fibrillation: Secondary | ICD-10-CM | POA: Diagnosis not present

## 2018-01-09 DIAGNOSIS — Z882 Allergy status to sulfonamides status: Secondary | ICD-10-CM | POA: Diagnosis not present

## 2018-01-09 DIAGNOSIS — Z87891 Personal history of nicotine dependence: Secondary | ICD-10-CM | POA: Diagnosis not present

## 2018-01-09 DIAGNOSIS — E785 Hyperlipidemia, unspecified: Secondary | ICD-10-CM | POA: Diagnosis not present

## 2018-01-09 DIAGNOSIS — I4891 Unspecified atrial fibrillation: Secondary | ICD-10-CM | POA: Diagnosis present

## 2018-01-09 DIAGNOSIS — Z7951 Long term (current) use of inhaled steroids: Secondary | ICD-10-CM | POA: Insufficient documentation

## 2018-01-09 HISTORY — PX: CARDIOVERSION: SHX1299

## 2018-01-09 LAB — POCT I-STAT 4, (NA,K, GLUC, HGB,HCT)
GLUCOSE: 158 mg/dL — AB (ref 65–99)
HCT: 41 % (ref 36.0–46.0)
HEMOGLOBIN: 13.9 g/dL (ref 12.0–15.0)
POTASSIUM: 3.7 mmol/L (ref 3.5–5.1)
SODIUM: 138 mmol/L (ref 135–145)

## 2018-01-09 SURGERY — CARDIOVERSION
Anesthesia: General

## 2018-01-09 MED ORDER — PROPOFOL 10 MG/ML IV BOLUS
INTRAVENOUS | Status: DC | PRN
  Administered 2018-01-09: 100 mg via INTRAVENOUS

## 2018-01-09 MED ORDER — LIDOCAINE HCL (CARDIAC) PF 100 MG/5ML IV SOSY
PREFILLED_SYRINGE | INTRAVENOUS | Status: DC | PRN
  Administered 2018-01-09: 80 mg via INTRATRACHEAL

## 2018-01-09 MED ORDER — SODIUM CHLORIDE 0.9 % IV SOLN
INTRAVENOUS | Status: DC
  Administered 2018-01-09: 13:00:00 via INTRAVENOUS

## 2018-01-09 MED ORDER — SODIUM CHLORIDE 0.9 % IV SOLN
INTRAVENOUS | Status: DC | PRN
  Administered 2018-01-09: 13:00:00 via INTRAVENOUS

## 2018-01-09 NOTE — Discharge Instructions (Signed)
Electrical Cardioversion, Care After °This sheet gives you information about how to care for yourself after your procedure. Your health care provider may also give you more specific instructions. If you have problems or questions, contact your health care provider. °What can I expect after the procedure? °After the procedure, it is common to have: °· Some redness on the skin where the shocks were given. ° °Follow these instructions at home: °· Do not drive for 24 hours if you were given a medicine to help you relax (sedative). °· Take over-the-counter and prescription medicines only as told by your health care provider. °· Ask your health care provider how to check your pulse. Check it often. °· Rest for 48 hours after the procedure or as told by your health care provider. °· Avoid or limit your caffeine use as told by your health care provider. °Contact a health care provider if: °· You feel like your heart is beating too quickly or your pulse is not regular. °· You have a serious muscle cramp that does not go away. °Get help right away if: °· You have discomfort in your chest. °· You are dizzy or you feel faint. °· You have trouble breathing or you are short of breath. °· Your speech is slurred. °· You have trouble moving an arm or leg on one side of your body. °· Your fingers or toes turn cold or blue. °This information is not intended to replace advice given to you by your health care provider. Make sure you discuss any questions you have with your health care provider. °Document Released: 05/29/2013 Document Revised: 03/11/2016 Document Reviewed: 02/12/2016 °Elsevier Interactive Patient Education © 2018 [employer] ° °

## 2018-01-09 NOTE — CV Procedure (Signed)
DCC: On Rx Eliquis no missed doses NPO Anesthesia Lidocaine / Propofol Dr Ignatius Specking   [facility] x 3 150 J and 200J biphasic x 2 Failed to convert to NSR  Will need f/u in afib clinic and initiation of AAT  [employer]

## 2018-01-09 NOTE — Interval H&P Note (Signed)
History and Physical Interval Note:  01/09/2018 1:46 PM  Leah Velasquez  has presented today for surgery, with the diagnosis of AFIB  The various methods of treatment have been discussed with the patient and family. After consideration of risks, benefits and other options for treatment, the patient has consented to  Procedure(s): CARDIOVERSION (N/A) as a surgical intervention .  The patient's history has been reviewed, patient examined, no change in status, stable for surgery.  I have reviewed the patient's chart and labs.  Questions were answered to the patient's satisfaction.     [name]

## 2018-01-09 NOTE — Anesthesia Preprocedure Evaluation (Addendum)
Anesthesia Evaluation  Patient identified by MRN, date of birth, ID band Patient awake    Reviewed: Allergy & Precautions, H&P , NPO status , Patient's Chart, lab work & pertinent test results  Airway Mallampati: II   Neck ROM: full    Dental  (+) Dental Advidsory Given   Pulmonary  H/o lung CA   breath sounds clear to auscultation       Cardiovascular hypertension, + dysrhythmias Atrial Fibrillation  Rhythm:irregular Rate:Normal     Neuro/Psych    GI/Hepatic   Endo/Other    Renal/GU      Musculoskeletal   Abdominal   Peds  Hematology   Anesthesia Other Findings   Reproductive/Obstetrics                            Anesthesia Physical Anesthesia Plan  ASA: III  Anesthesia Plan: General   Post-op Pain Management:    Induction: Intravenous  PONV Risk Score and Plan: 3 and Propofol infusion and Treatment may vary due to age or medical condition  Airway Management Planned: Mask  Additional Equipment:   Intra-op Plan:   Post-operative Plan:   Informed Consent: I have reviewed the patients History and Physical, chart, labs and discussed the procedure including the risks, benefits and alternatives for the proposed anesthesia with the patient or authorized representative who has indicated his/her understanding and acceptance.   Dental Advisory Given  Plan Discussed with: CRNA and Anesthesiologist  Anesthesia Plan Comments:        Anesthesia Quick Evaluation

## 2018-01-09 NOTE — Transfer of Care (Signed)
Immediate Anesthesia Transfer of Care Note  Patient: Leah Velasquez  Procedure(s) Performed: CARDIOVERSION (N/A )  Patient Location: Endoscopy Unit  Anesthesia Type:General  Level of Consciousness: awake, alert  and oriented  Airway & Oxygen Therapy: Patient Spontanous Breathing and Patient connected to nasal cannula oxygen  Post-op Assessment: Report given to RN, Post -op Vital signs reviewed and stable and Patient moving all extremities X 4  Post vital signs: Reviewed and stable  Last Vitals:  Vitals Value Taken Time  BP 120/69 01/09/2018  1:59 PM  Temp    Pulse 82 01/09/2018  2:00 PM  Resp 24 01/09/2018  2:00 PM  SpO2 99 % 01/09/2018  2:00 PM  Vitals shown include unvalidated device data.  Last Pain:  Vitals:   01/09/18 1230  TempSrc: Oral  PainSc: 0-No pain         Complications: No apparent anesthesia complications

## 2018-01-10 ENCOUNTER — Encounter (HOSPITAL_COMMUNITY): Payer: Self-pay | Admitting: Cardiovascular Disease

## 2018-01-10 NOTE — Anesthesia Postprocedure Evaluation (Signed)
Anesthesia Post Note  Patient: Leah Velasquez  Procedure(s) Performed: CARDIOVERSION (N/A )     Patient location during evaluation: Endoscopy Anesthesia Type: General Level of consciousness: awake and alert Pain management: pain level controlled Vital Signs Assessment: post-procedure vital signs reviewed and stable Respiratory status: spontaneous breathing, nonlabored ventilation, respiratory function stable and patient connected to nasal cannula oxygen Cardiovascular status: blood pressure returned to baseline and stable Postop Assessment: no apparent nausea or vomiting Anesthetic complications: no    Last Vitals:  Vitals:   01/09/18 1425 01/09/18 1430  BP: 138/86 110/62  Pulse: 72 69  Resp: (!) 24 (!) 28  Temp:    SpO2: 98% 96%    Last Pain:  Vitals:   01/10/18 1458  TempSrc:   PainSc: 0-No pain   Pain Goal:                 Malachy Coleman S

## 2018-01-11 ENCOUNTER — Other Ambulatory Visit: Payer: Self-pay | Admitting: Cardiovascular Disease

## 2018-01-11 ENCOUNTER — Telehealth: Payer: Self-pay | Admitting: Cardiovascular Disease

## 2018-01-11 NOTE — Telephone Encounter (Signed)
appt made for 5/30 @ 3pm per pt request.

## 2018-01-11 NOTE — Telephone Encounter (Signed)
Returned call to patient, advised per chart review patient needs follow up in Afib clinic.  Advised I would send message to Afib clinic and have them call to schedule.   Patient also wondering if she needed to continue her Eliquis and Coreg.  Educated patient on the medications and why she is on them.  She states the carvedilol is making her very tired.    Advised to continue until follow up appt.   Patient aware and verbalized understanding.

## 2018-01-11 NOTE — Telephone Encounter (Signed)
°*  STAT* If patient is at the pharmacy, call can be transferred to refill team.   1. Which medications need to be refilled? (please list name of each medication and dose if known) Eliquis-need enough until her Mail Order comes in  2. Which pharmacy/location (including street and city if local pharmacy) is medication to be sent to? [facility] [phone]  3. Do they need a 30 day or 90 day supply? [place]

## 2018-01-11 NOTE — Telephone Encounter (Signed)
Follow Up:   Pt said she had a Cardioversion on 01-09-18.She said Dr Karlyne Greenspan told her the office would be contacting her.She said she had not heard anything.Does she need an appointment or was somebody was just to call and follow up with her.

## 2018-01-12 MED ORDER — APIXABAN 5 MG PO TABS: 5.0000 mg | ORAL_TABLET | Freq: Two times a day (BID) | ORAL | 0 refills | Status: DC

## 2018-01-18 ENCOUNTER — Ambulatory Visit (HOSPITAL_COMMUNITY)
Admission: RE | Admit: 2018-01-18 | Discharge: 2018-01-18 | Disposition: A | Discharge status: Home / Self Care | Payer: Medicare HMO | Source: Ambulatory Visit | Attending: Nurse Practitioner | Admitting: Nurse Practitioner

## 2018-01-18 ENCOUNTER — Encounter (HOSPITAL_COMMUNITY): Payer: Self-pay | Admitting: Nurse Practitioner

## 2018-01-18 VITALS — BP 130/84 | HR 83 | Ht 61.0 in | Wt 263.2 lb

## 2018-01-18 DIAGNOSIS — Z882 Allergy status to sulfonamides status: Secondary | ICD-10-CM | POA: Diagnosis not present

## 2018-01-18 DIAGNOSIS — I481 Persistent atrial fibrillation: Secondary | ICD-10-CM | POA: Diagnosis present

## 2018-01-18 DIAGNOSIS — Z79899 Other long term (current) drug therapy: Secondary | ICD-10-CM | POA: Diagnosis not present

## 2018-01-18 DIAGNOSIS — I10 Essential (primary) hypertension: Secondary | ICD-10-CM | POA: Insufficient documentation

## 2018-01-18 DIAGNOSIS — Z885 Allergy status to narcotic agent status: Secondary | ICD-10-CM | POA: Insufficient documentation

## 2018-01-18 DIAGNOSIS — R9431 Abnormal electrocardiogram [ECG] [EKG]: Secondary | ICD-10-CM | POA: Diagnosis not present

## 2018-01-18 DIAGNOSIS — R0602 Shortness of breath: Secondary | ICD-10-CM | POA: Insufficient documentation

## 2018-01-18 DIAGNOSIS — Z8701 Personal history of pneumonia (recurrent): Secondary | ICD-10-CM | POA: Insufficient documentation

## 2018-01-18 DIAGNOSIS — I4819 Other persistent atrial fibrillation: Secondary | ICD-10-CM

## 2018-01-18 DIAGNOSIS — Z85118 Personal history of other malignant neoplasm of bronchus and lung: Secondary | ICD-10-CM | POA: Insufficient documentation

## 2018-01-18 DIAGNOSIS — Z7901 Long term (current) use of anticoagulants: Secondary | ICD-10-CM | POA: Diagnosis not present

## 2018-01-18 MED ORDER — APIXABAN 5 MG PO TABS: 5.0000 mg | ORAL_TABLET | Freq: Two times a day (BID) | ORAL | 2 refills | Status: DC

## 2018-01-18 MED ORDER — DILTIAZEM HCL ER COATED BEADS 120 MG PO CP24: 120.0000 mg | ORAL_CAPSULE | Freq: Every day | ORAL | 3 refills | Status: DC

## 2018-01-18 NOTE — Patient Instructions (Signed)
Stop Coreg  Start diltiazem 120 mg, taking 1 capsule at bedtime.

## 2018-01-19 NOTE — Progress Notes (Signed)
Primary Care Physician: [name], MD Referring Physician: Dr. Ruffin Pyo Leah Velasquez is a 77 y.o. female with a h/o  HTN, lung cancer.  She was hospitalized on 10/21/17 for 4 days because of influenza A, pneumonia/COPD. She did smoke 20 years ago. She's never had a heart attack or stroke. She developed A. Fib during her hospitalization, was placed on carvedilol for rate control as well as Eliquis oral anticoagulation for stroke prophylaxis.she does feel somewhat weak and is walking with a walker now getting physical therapy.  Sheis on Eliquis and carvedilol.  She was set up for cardioversion but failed to convert.   She is in the afib clinic for options to restore SR. She is c/o coreg making her very fatigued. She is still weak from pneumonia, but explained to pt some of her fatigue maybe 2/2 afib as well,   Today, she denies symptoms of palpitations, chest pain, shortness of breath, orthopnea, PND, lower extremity edema, dizziness, presyncope, syncope, or neurologic sequela. The patient is tolerating medications without difficulties and is otherwise without complaint today.   Past Medical History:  Diagnosis Date  . Cancer ([place])    lung cancer  . Hypertension    Past Surgical History:  Procedure Laterality Date  . BREAST EXCISIONAL BIOPSY Left    x 2  . BREAST EXCISIONAL BIOPSY Right   . CARDIOVERSION N/A 01/09/2018   Procedure: CARDIOVERSION;  Surgeon: Josue Hector, MD;  Location: [facility] ENDOSCOPY;  Service: Cardiovascular;  Laterality: N/A;    Current Outpatient Medications  Medication Sig Dispense Refill  . albuterol (PROVENTIL HFA;VENTOLIN HFA) 108 (90 Base) MCG/ACT inhaler Inhale 2 puffs into the lungs every 6 (six) hours as needed for wheezing or shortness of breath. 1 Inhaler 0  . apixaban (ELIQUIS) 5 MG TABS tablet Take 1 tablet (5 mg total) by mouth 2 (two) times daily. 180 tablet 2  . calcium carbonate (TUMS - DOSED IN MG ELEMENTAL CALCIUM) 500 MG chewable tablet Chew 1  tablet (200 mg of elemental calcium total) by mouth 3 (three) times daily as needed for indigestion or heartburn.    . Cholecalciferol (VITAMIN D3) 1000 units CAPS Take 1,000 Units by mouth daily.    . colchicine (COLCRYS) 0.6 MG tablet Take 0.6 mg by mouth every other day.    . fluticasone (FLONASE) 50 MCG/ACT nasal spray Place 1 spray into both nostrils daily.     . furosemide (LASIX) 20 MG tablet Take 40 mg by mouth daily.     Marland Kitchen levocetirizine (XYZAL) 5 MG tablet Take 5 mg by mouth every evening.     Marland Kitchen losartan (COZAAR) 50 MG tablet Take 1 tablet (50 mg total) by mouth daily. 30 tablet 6  . omeprazole (PRILOSEC) 20 MG capsule Take 20 mg by mouth daily.    . simvastatin (ZOCOR) 20 MG tablet Take 20 mg by mouth at bedtime.    . timolol (TIMOPTIC) 0.5 % ophthalmic solution Place 1 drop into both eyes 2 (two) times daily.    . traMADol (ULTRAM) 50 MG tablet Take 50 mg by mouth daily as needed for moderate pain.   0  . zolpidem (AMBIEN) 5 MG tablet Take 5 mg by mouth at bedtime as needed for sleep.     Marland Kitchen diltiazem (CARDIZEM CD) 120 MG 24 hr capsule Take 1 capsule (120 mg total) by mouth at bedtime. 30 capsule 3   No current facility-administered medications for this encounter.     Allergies  Allergen Reactions  . Morphine Anaphylaxis  . Sulfa Antibiotics Rash    Social History   Socioeconomic History  . Marital status: Married    Spouse name: Not on file  . Number of children: Not on file  . Years of education: Not on file  . Highest education level: Not on file  Occupational History  . Not on file  Social Needs  . Financial resource strain: Not on file  . Food insecurity:    Worry: Not on file    Inability: Not on file  . Transportation needs:    Medical: Not on file    Non-medical: Not on file  Tobacco Use  . Smoking status: Never Smoker  . Smokeless tobacco: Never Used  Substance and Sexual Activity  . Alcohol use: No    Frequency: Never  . Drug use: No  . Sexual  activity: Not on file  Lifestyle  . Physical activity:    Days per week: Not on file    Minutes per session: Not on file  . Stress: Not on file  Relationships  . Social connections:    Talks on phone: Not on file    Gets together: Not on file    Attends religious service: Not on file    Active member of club or organization: Not on file    Attends meetings of clubs or organizations: Not on file    Relationship status: Not on file  . Intimate partner violence:    Fear of current or ex partner: Not on file    Emotionally abused: Not on file    Physically abused: Not on file    Forced sexual activity: Not on file  Other Topics Concern  . Not on file  Social History Narrative  . Not on file    History reviewed. No pertinent family history.  ROS- All systems are reviewed and negative except as per the HPI above  Physical Exam: Vitals:   01/18/18 1505  BP: 130/84  Pulse: 83  SpO2: 96%  Weight: 263 lb 3.2 oz (119.4 kg)  Height: 5\' 1"  (1.549 m)   Wt Readings from Last 3 Encounters:  01/18/18 263 lb 3.2 oz (119.4 kg)  12/26/17 265 lb 6.4 oz (120.4 kg)  11/24/17 263 lb (119.3 kg)    Labs: Lab Results  Component Value Date   NA 138 01/09/2018   K 3.7 01/09/2018   CL 98 12/26/2017   CO2 23 12/26/2017   GLUCOSE 158 (H) 01/09/2018   BUN 13 12/26/2017   CREATININE 0.78 12/26/2017   CALCIUM 9.5 12/26/2017   No results found for: INR No results found for: CHOL, HDL, LDLCALC, TRIG   GEN- The patient is well appearing, alert and oriented x 3 today.   Head- normocephalic, atraumatic Eyes-  Sclera clear, conjunctiva pink Ears- hearing intact Oropharynx- clear Neck- supple, no JVP Lymph- no cervical lymphadenopathy Lungs- Clear to ausculation bilaterally, normal work of breathing Heart- irregular rate and rhythm, no murmurs, rubs or gallops, PMI not laterally displaced GI- soft, NT, ND, + BS Extremities- no clubbing, cyanosis, or edema MS- no significant deformity or  atrophy Skin- no rash or lesion Psych- euthymic mood, full affect Neuro- strength and sensation are intact  EKG-afib at 85 bpm, qrs int 70 ms, qtc 445 ms Epic records reviewed Echo-Study Conclusions  - Left ventricle: The cavity size was normal. There was mild   concentric hypertrophy. Systolic function was normal. The   estimated ejection fraction was in  the range of 60% to 65%. Wall   motion was normal; there were no regional wall motion   abnormalities. - Aortic valve: There was no regurgitation. - Mitral valve: There was trivial regurgitation. - Right ventricle: Systolic function was normal. - Right atrium: The atrium was normal in size. - Tricuspid valve: There was no regurgitation. - Pulmonary arteries: Systolic pressure could not be accurately   estimated. - Inferior vena cava: The vessel was normal in size. - Pericardium, extracardiac: There was no pericardial effusion.     Assessment and Plan: 1. Persistent afib Failed cardioversion Discussed options to restore SR  Multaq and flecainide discussed Pt is questioning cost of  Multaq which is usually a concern for the Medicare population as it is a branded drug and can run around $100 a month Flecainide may be an option but I will need to stress test first to assess for CAD For now, the pt would just like to change BB for CCB as she feels very sluggish on drug and take more about this on f/u. Will stop  metoprolol and start 120 mg cardizem at HS She will continue Eliquis 5 mg bid for chadsvasc score of 3  F/u in 2 weeks  Butch Penny C. Alixandrea Milleson, [facility] [address] [place], [place] [address] [phone]

## 2018-01-30 ENCOUNTER — Ambulatory Visit (HOSPITAL_COMMUNITY)
Admission: RE | Admit: 2018-01-30 | Discharge: 2018-01-30 | Disposition: A | Discharge status: Home / Self Care | Payer: Medicare HMO | Source: Ambulatory Visit | Attending: Nurse Practitioner | Admitting: Nurse Practitioner

## 2018-01-30 VITALS — BP 110/56 | HR 77

## 2018-01-30 DIAGNOSIS — Z79891 Long term (current) use of opiate analgesic: Secondary | ICD-10-CM | POA: Insufficient documentation

## 2018-01-30 DIAGNOSIS — Z885 Allergy status to narcotic agent status: Secondary | ICD-10-CM | POA: Insufficient documentation

## 2018-01-30 DIAGNOSIS — Z79899 Other long term (current) drug therapy: Secondary | ICD-10-CM | POA: Insufficient documentation

## 2018-01-30 DIAGNOSIS — I10 Essential (primary) hypertension: Secondary | ICD-10-CM | POA: Diagnosis not present

## 2018-01-30 DIAGNOSIS — E785 Hyperlipidemia, unspecified: Secondary | ICD-10-CM | POA: Insufficient documentation

## 2018-01-30 DIAGNOSIS — Z85118 Personal history of other malignant neoplasm of bronchus and lung: Secondary | ICD-10-CM | POA: Diagnosis not present

## 2018-01-30 DIAGNOSIS — Z882 Allergy status to sulfonamides status: Secondary | ICD-10-CM | POA: Insufficient documentation

## 2018-01-30 DIAGNOSIS — Z9889 Other specified postprocedural states: Secondary | ICD-10-CM | POA: Insufficient documentation

## 2018-01-30 DIAGNOSIS — Z7901 Long term (current) use of anticoagulants: Secondary | ICD-10-CM | POA: Diagnosis not present

## 2018-01-30 DIAGNOSIS — I481 Persistent atrial fibrillation: Secondary | ICD-10-CM | POA: Insufficient documentation

## 2018-01-30 DIAGNOSIS — I4819 Other persistent atrial fibrillation: Secondary | ICD-10-CM

## 2018-01-30 HISTORY — DX: Other persistent atrial fibrillation: I48.19

## 2018-01-30 HISTORY — DX: Hyperlipidemia, unspecified: E78.5

## 2018-01-30 HISTORY — DX: Obesity, unspecified: E66.9

## 2018-01-30 MED ORDER — FLECAINIDE ACETATE 50 MG PO TABS: 50.0000 mg | ORAL_TABLET | Freq: Two times a day (BID) | ORAL | 3 refills | Status: DC

## 2018-01-30 NOTE — Patient Instructions (Signed)
Start Flecainide 50mg  twice a day

## 2018-01-31 ENCOUNTER — Encounter (HOSPITAL_COMMUNITY): Payer: Self-pay | Admitting: Nurse Practitioner

## 2018-01-31 NOTE — Progress Notes (Signed)
Primary Care Physician: [name], MD Primary Cardiologist: Oneka Parada is a 77 y.o. female with a history of persistent atrial fibrillation who presents for follow up in the [facility].  Since last being seen in clinic, the patient reports doing reasonably well.  Her energy level is much improved after changing from BB to CCB at last visit. She remains with shortness of breath on exertion and exercise intolerance.   Today, she denies symptoms of chest pain, orthopnea, PND, lower extremity edema, dizziness, presyncope, syncope, snoring, daytime somnolence, bleeding, or neurologic sequela. The patient is tolerating medications without difficulties and is otherwise without complaint today.    Atrial Fibrillation Risk Factors:  she does not have symptoms or diagnosis of sleep apnea.  she does not have a history of rheumatic fever.  she does not have a history of alcohol use.  LA size: 40   Atrial Fibrillation Management history:  Previous antiarrhythmic drugs: none  Previous cardioversions: 12/2017 (failed)  Previous ablations: none  CHADS2VASC score: 4  Anticoagulation history: Eliquis   Past Medical History:  Diagnosis Date  . Cancer ([place])    lung cancer  . Hyperlipidemia   . Hypertension   . Obesity   . Persistent atrial fibrillation [facility])    Past Surgical History:  Procedure Laterality Date  . BREAST EXCISIONAL BIOPSY Left    x 2  . BREAST EXCISIONAL BIOPSY Right   . CARDIOVERSION N/A 01/09/2018   Procedure: CARDIOVERSION;  Surgeon: Josue Hector, MD;  Location: [facility] ENDOSCOPY;  Service: Cardiovascular;  Laterality: N/A;    Current Outpatient Medications  Medication Sig Dispense Refill  . albuterol (PROVENTIL HFA;VENTOLIN HFA) 108 (90 Base) MCG/ACT inhaler Inhale 2 puffs into the lungs every 6 (six) hours as needed for wheezing or shortness of breath. 1 Inhaler 0  . apixaban (ELIQUIS) 5 MG TABS tablet Take 1 tablet (5  mg total) by mouth 2 (two) times daily. 180 tablet 2  . calcium carbonate (TUMS - DOSED IN MG ELEMENTAL CALCIUM) 500 MG chewable tablet Chew 1 tablet (200 mg of elemental calcium total) by mouth 3 (three) times daily as needed for indigestion or heartburn.    . Cholecalciferol (VITAMIN D3) 1000 units CAPS Take 1,000 Units by mouth daily.    . colchicine (COLCRYS) 0.6 MG tablet Take 0.6 mg by mouth every other day.    . diltiazem (CARDIZEM CD) 120 MG 24 hr capsule Take 1 capsule (120 mg total) by mouth at bedtime. 30 capsule 3  . fluticasone (FLONASE) 50 MCG/ACT nasal spray Place 1 spray into both nostrils daily.     . furosemide (LASIX) 20 MG tablet Take 40 mg by mouth daily.     Marland Kitchen levocetirizine (XYZAL) 5 MG tablet Take 5 mg by mouth every evening.     Marland Kitchen losartan (COZAAR) 50 MG tablet Take 1 tablet (50 mg total) by mouth daily. 30 tablet 6  . omeprazole (PRILOSEC) 20 MG capsule Take 20 mg by mouth daily.    . simvastatin (ZOCOR) 20 MG tablet Take 20 mg by mouth at bedtime.    . timolol (TIMOPTIC) 0.5 % ophthalmic solution Place 1 drop into both eyes 2 (two) times daily.    . traMADol (ULTRAM) 50 MG tablet Take 50 mg by mouth daily as needed for moderate pain.   0  . zolpidem (AMBIEN) 5 MG tablet Take 5 mg by mouth at bedtime as needed for sleep.     Marland Kitchen  flecainide (TAMBOCOR) 50 MG tablet Take 1 tablet (50 mg total) by mouth 2 (two) times daily. 60 tablet 3   No current facility-administered medications for this encounter.     Allergies  Allergen Reactions  . Morphine Anaphylaxis  . Sulfa Antibiotics Rash    Social History   Socioeconomic History  . Marital status: Married    Spouse name: Not on file  . Number of children: Not on file  . Years of education: Not on file  . Highest education level: Not on file  Occupational History  . Not on file  Social Needs  . Financial resource strain: Not on file  . Food insecurity:    Worry: Not on file    Inability: Not on file  .  Transportation needs:    Medical: Not on file    Non-medical: Not on file  Tobacco Use  . Smoking status: Never Smoker  . Smokeless tobacco: Never Used  Substance and Sexual Activity  . Alcohol use: No    Frequency: Never  . Drug use: No  . Sexual activity: Not on file  Lifestyle  . Physical activity:    Days per week: Not on file    Minutes per session: Not on file  . Stress: Not on file  Relationships  . Social connections:    Talks on phone: Not on file    Gets together: Not on file    Attends religious service: Not on file    Active member of club or organization: Not on file    Attends meetings of clubs or organizations: Not on file    Relationship status: Not on file  . Intimate partner violence:    Fear of current or ex partner: Not on file    Emotionally abused: Not on file    Physically abused: Not on file    Forced sexual activity: Not on file  Other Topics Concern  . Not on file  Social History Narrative  . Not on file    ROS- All systems are reviewed and negative except as per the HPI above.  Physical Exam: Vitals:   01/30/18 1539  BP: (!) 110/56  Pulse: 77    GEN- The patient is obese appearing, alert and oriented x 3 today.   Head- normocephalic, atraumatic Eyes-  Sclera clear, conjunctiva pink Ears- hearing intact Oropharynx- clear Neck- supple  Lungs- Clear to ausculation bilaterally, normal work of breathing Heart- Irregular rate and rhythm  GI- soft, NT, ND, + BS Extremities- no clubbing, cyanosis, or edema MS- no significant deformity or atrophy Skin- no rash or lesion Psych- euthymic mood, full affect Neuro- strength and sensation are intact  Wt Readings from Last 3 Encounters:  01/18/18 263 lb 3.2 oz (119.4 kg)  12/26/17 265 lb 6.4 oz (120.4 kg)  11/24/17 263 lb (119.3 kg)    EKG today demonstrates atrial fibrillation, rate 77, QRS 60msec  Epic records are reviewed at length today  Assessment and Plan:  1. Persistent atrial  fibrillation Symptomatically improved on CCB rather than BB Would like to pursue restoration of SR She has failed DCCV off AAD therapy Will start Flecainide 50mg  twice daily today.  Follow up 02/12/18. If still in AF at that time, can increase to 100mg  twice daily and proceed with repeat DCCV Continue Eliquis for CHADS2VASC of 4 - she reports no missed doses If she is able to maintain SR on Flecainide, will need myoview at some point.  Echo normal, she does not  exercise  2. Morbid obesity Weight loss encouraged  3. HTN Stable No change required today  Follow up with AF clinic 02/12/18  Chanetta Marshall, NP 01/31/2018 7:52 AM

## 2018-02-01 ENCOUNTER — Ambulatory Visit (HOSPITAL_COMMUNITY): Payer: Medicare HMO | Admitting: Nurse Practitioner

## 2018-02-12 ENCOUNTER — Ambulatory Visit (HOSPITAL_COMMUNITY): Payer: Medicare HMO | Admitting: Nurse Practitioner

## 2018-02-14 ENCOUNTER — Encounter (HOSPITAL_COMMUNITY): Payer: Self-pay | Admitting: Nurse Practitioner

## 2018-02-14 ENCOUNTER — Ambulatory Visit (HOSPITAL_COMMUNITY)
Admission: RE | Admit: 2018-02-14 | Discharge: 2018-02-14 | Disposition: A | Discharge status: Home / Self Care | Payer: Medicare HMO | Source: Ambulatory Visit | Attending: Nurse Practitioner | Admitting: Nurse Practitioner

## 2018-02-14 VITALS — BP 144/72 | HR 70 | Ht 61.0 in | Wt 263.0 lb

## 2018-02-14 DIAGNOSIS — Z882 Allergy status to sulfonamides status: Secondary | ICD-10-CM | POA: Diagnosis not present

## 2018-02-14 DIAGNOSIS — I481 Persistent atrial fibrillation: Secondary | ICD-10-CM | POA: Insufficient documentation

## 2018-02-14 DIAGNOSIS — R9431 Abnormal electrocardiogram [ECG] [EKG]: Secondary | ICD-10-CM | POA: Insufficient documentation

## 2018-02-14 DIAGNOSIS — E669 Obesity, unspecified: Secondary | ICD-10-CM | POA: Diagnosis not present

## 2018-02-14 DIAGNOSIS — E785 Hyperlipidemia, unspecified: Secondary | ICD-10-CM | POA: Insufficient documentation

## 2018-02-14 DIAGNOSIS — Z6841 Body Mass Index (BMI) 40.0 and over, adult: Secondary | ICD-10-CM | POA: Insufficient documentation

## 2018-02-14 DIAGNOSIS — Z9889 Other specified postprocedural states: Secondary | ICD-10-CM | POA: Insufficient documentation

## 2018-02-14 DIAGNOSIS — Z7902 Long term (current) use of antithrombotics/antiplatelets: Secondary | ICD-10-CM | POA: Insufficient documentation

## 2018-02-14 DIAGNOSIS — Z885 Allergy status to narcotic agent status: Secondary | ICD-10-CM | POA: Insufficient documentation

## 2018-02-14 DIAGNOSIS — I10 Essential (primary) hypertension: Secondary | ICD-10-CM | POA: Diagnosis not present

## 2018-02-14 DIAGNOSIS — Z7952 Long term (current) use of systemic steroids: Secondary | ICD-10-CM | POA: Insufficient documentation

## 2018-02-14 DIAGNOSIS — Z79811 Long term (current) use of aromatase inhibitors: Secondary | ICD-10-CM | POA: Diagnosis not present

## 2018-02-14 DIAGNOSIS — Z7983 Long term (current) use of bisphosphonates: Secondary | ICD-10-CM | POA: Insufficient documentation

## 2018-02-14 DIAGNOSIS — Z79899 Other long term (current) drug therapy: Secondary | ICD-10-CM | POA: Insufficient documentation

## 2018-02-14 DIAGNOSIS — Z79891 Long term (current) use of opiate analgesic: Secondary | ICD-10-CM | POA: Diagnosis not present

## 2018-02-14 DIAGNOSIS — Z85118 Personal history of other malignant neoplasm of bronchus and lung: Secondary | ICD-10-CM | POA: Diagnosis not present

## 2018-02-14 DIAGNOSIS — Z7901 Long term (current) use of anticoagulants: Secondary | ICD-10-CM | POA: Diagnosis not present

## 2018-02-14 DIAGNOSIS — I4819 Other persistent atrial fibrillation: Secondary | ICD-10-CM

## 2018-02-14 MED ORDER — FLECAINIDE ACETATE 50 MG PO TABS: 100.0000 mg | ORAL_TABLET | Freq: Two times a day (BID) | ORAL | 3 refills | Status: DC

## 2018-02-14 NOTE — Progress Notes (Signed)
Primary Care Physician: [name], MD Referring Physician: Dr. Ruffin Pyo Leah Velasquez is a 77 y.o. female with a h/o  HTN, lung cancer.  She was hospitalized on 10/21/17 for 4 days because of influenza A, pneumonia/COPD. She did smoke 20 years ago. She's never had a heart attack or stroke. She developed A. Fib during her hospitalization, was placed on carvedilol for rate control as well as Eliquis oral anticoagulation for stroke prophylaxis.she does feel somewhat weak and is walking with a walker now getting physical therapy.  Sheis on Eliquis and carvedilol.  She was set up for cardioversion but failed to convert.   She is in the afib clinic for options to restore SR. She is c/o coreg making her very fatigued. She is still weak from pneumonia, but explained to pt some of her fatigue maybe 2/2 afib as well. On last visit, she was changed to CCB with improvement in fatigue. She saw Chanetta Marshall, NP, on last visit and was started on  flecainide 50 mg bid. In the clinic today, she states that she is tolerating drug and EKG shows no adverse interval changes. However, she does continue in afib and will plan to increase to 100 mg bid and cardiovert in the near future.   Today, she denies symptoms of palpitations, chest pain, shortness of breath, orthopnea, PND, lower extremity edema, dizziness, presyncope, syncope, or neurologic sequela. The patient is tolerating medications without difficulties and is otherwise without complaint today.   Past Medical History:  Diagnosis Date  . Cancer ([place])    lung cancer  . Hyperlipidemia   . Hypertension   . Obesity   . Persistent atrial fibrillation [facility])    Past Surgical History:  Procedure Laterality Date  . BREAST EXCISIONAL BIOPSY Left    x 2  . BREAST EXCISIONAL BIOPSY Right   . CARDIOVERSION N/A 01/09/2018   Procedure: CARDIOVERSION;  Surgeon: Josue Hector, MD;  Location: [facility] ENDOSCOPY;  Service: Cardiovascular;  Laterality: N/A;    Current  Outpatient Medications  Medication Sig Dispense Refill  . albuterol (PROVENTIL HFA;VENTOLIN HFA) 108 (90 Base) MCG/ACT inhaler Inhale 2 puffs into the lungs every 6 (six) hours as needed for wheezing or shortness of breath. 1 Inhaler 0  . apixaban (ELIQUIS) 5 MG TABS tablet Take 1 tablet (5 mg total) by mouth 2 (two) times daily. 180 tablet 2  . calcium carbonate (TUMS - DOSED IN MG ELEMENTAL CALCIUM) 500 MG chewable tablet Chew 1 tablet (200 mg of elemental calcium total) by mouth 3 (three) times daily as needed for indigestion or heartburn.    . Cholecalciferol (VITAMIN D3) 1000 units CAPS Take 1,000 Units by mouth daily.    . colchicine (COLCRYS) 0.6 MG tablet Take 0.6 mg by mouth daily.     Marland Kitchen diltiazem (CARDIZEM CD) 120 MG 24 hr capsule Take 1 capsule (120 mg total) by mouth at bedtime. 30 capsule 3  . flecainide (TAMBOCOR) 50 MG tablet Take 2 tablets (100 mg total) by mouth 2 (two) times daily. 60 tablet 3  . fluticasone (FLONASE) 50 MCG/ACT nasal spray Place 1 spray into both nostrils daily.     . furosemide (LASIX) 20 MG tablet Take 40 mg by mouth daily.     Marland Kitchen levocetirizine (XYZAL) 5 MG tablet Take 5 mg by mouth every evening.     Marland Kitchen losartan (COZAAR) 50 MG tablet Take 1 tablet (50 mg total) by mouth daily. 30 tablet 6  . omeprazole (PRILOSEC)  20 MG capsule Take 20 mg by mouth daily.    . predniSONE (DELTASONE) 5 MG tablet ta    . simvastatin (ZOCOR) 20 MG tablet Take 20 mg by mouth at bedtime.    . timolol (TIMOPTIC) 0.5 % ophthalmic solution Place 1 drop into both eyes 2 (two) times daily.    . traMADol (ULTRAM) 50 MG tablet Take 50 mg by mouth daily as needed for moderate pain.   0  . zolpidem (AMBIEN) 5 MG tablet Take 5 mg by mouth at bedtime as needed for sleep.      No current facility-administered medications for this encounter.     Allergies  Allergen Reactions  . Morphine Anaphylaxis  . Sulfa Antibiotics Rash    Social History   Socioeconomic History  . Marital status:  Married    Spouse name: Not on file  . Number of children: Not on file  . Years of education: Not on file  . Highest education level: Not on file  Occupational History  . Not on file  Social Needs  . Financial resource strain: Not on file  . Food insecurity:    Worry: Not on file    Inability: Not on file  . Transportation needs:    Medical: Not on file    Non-medical: Not on file  Tobacco Use  . Smoking status: Never Smoker  . Smokeless tobacco: Never Used  Substance and Sexual Activity  . Alcohol use: No    Frequency: Never  . Drug use: No  . Sexual activity: Not on file  Lifestyle  . Physical activity:    Days per week: Not on file    Minutes per session: Not on file  . Stress: Not on file  Relationships  . Social connections:    Talks on phone: Not on file    Gets together: Not on file    Attends religious service: Not on file    Active member of club or organization: Not on file    Attends meetings of clubs or organizations: Not on file    Relationship status: Not on file  . Intimate partner violence:    Fear of current or ex partner: Not on file    Emotionally abused: Not on file    Physically abused: Not on file    Forced sexual activity: Not on file  Other Topics Concern  . Not on file  Social History Narrative  . Not on file    No family history on file.  ROS- All systems are reviewed and negative except as per the HPI above  Physical Exam: Vitals:   02/14/18 1452  BP: (!) 144/72  Pulse: 70  Weight: 263 lb (119.3 kg)  Height: 5\' 1"  (1.549 m)   Wt Readings from Last 3 Encounters:  02/14/18 263 lb (119.3 kg)  01/18/18 263 lb 3.2 oz (119.4 kg)  12/26/17 265 lb 6.4 oz (120.4 kg)    Labs: Lab Results  Component Value Date   NA 138 01/09/2018   K 3.7 01/09/2018   CL 98 12/26/2017   CO2 23 12/26/2017   GLUCOSE 158 (H) 01/09/2018   BUN 13 12/26/2017   CREATININE 0.78 12/26/2017   CALCIUM 9.5 12/26/2017   No results found for: INR No  results found for: CHOL, HDL, LDLCALC, TRIG   GEN- The patient is well appearing, alert and oriented x 3 today.   Head- normocephalic, atraumatic Eyes-  Sclera clear, conjunctiva pink Ears- hearing intact Oropharynx- clear Neck-  supple, no JVP Lymph- no cervical lymphadenopathy Lungs- Clear to ausculation bilaterally, normal work of breathing Heart- irregular rate and rhythm, no murmurs, rubs or gallops, PMI not laterally displaced GI- soft, NT, ND, + BS Extremities- no clubbing, cyanosis, or edema MS- no significant deformity or atrophy Skin- no rash or lesion Psych- euthymic mood, full affect Neuro- strength and sensation are intact  EKG-afib at 70 bpm, qrs int 70 ms, qtc 445 ms Epic records reviewed Echo-Study Conclusions  - Left ventricle: The cavity size was normal. There was mild   concentric hypertrophy. Systolic function was normal. The   estimated ejection fraction was in the range of 60% to 65%. Wall   motion was normal; there were no regional wall motion   abnormalities. - Aortic valve: There was no regurgitation. - Mitral valve: There was trivial regurgitation. - Right ventricle: Systolic function was normal. - Right atrium: The atrium was normal in size. - Tricuspid valve: There was no regurgitation. - Pulmonary arteries: Systolic pressure could not be accurately   estimated. - Inferior vena cava: The vessel was normal in size. - Pericardium, extracardiac: There was no pericardial effusion.     Assessment and Plan: 1. Persistent afib Failed cardioversion She has been started on flecainide 50 mg bid and is still in afib with normal intervals so will increase to 100 mg bid and plan on cardioversion next week She will return on Monday for Ekg on 100 mg bid After cardioversion and return of SR will plan on a stress test Continue diltiazem 120 mg qd  She will continue Eliquis 5 mg bid for chadsvasc score of 3, reminded not to miss doses  F/u Monday  Della Homan  C. Leeza Heiner, [facility] [address] [place], [place] [address] [phone]

## 2018-02-14 NOTE — Patient Instructions (Signed)
Increase flecainide to 100mg  twice a day (2 of your 50mg  tabs twice a day)

## 2018-02-16 ENCOUNTER — Other Ambulatory Visit (HOSPITAL_COMMUNITY): Payer: Self-pay | Admitting: *Deleted

## 2018-02-19 ENCOUNTER — Encounter (HOSPITAL_COMMUNITY): Payer: Self-pay | Admitting: Nurse Practitioner

## 2018-02-19 ENCOUNTER — Ambulatory Visit (HOSPITAL_COMMUNITY)
Admission: RE | Admit: 2018-02-19 | Discharge: 2018-02-19 | Disposition: A | Discharge status: Home / Self Care | Payer: Medicare HMO | Source: Ambulatory Visit | Attending: Nurse Practitioner | Admitting: Nurse Practitioner

## 2018-02-19 DIAGNOSIS — Z79899 Other long term (current) drug therapy: Secondary | ICD-10-CM | POA: Insufficient documentation

## 2018-02-19 DIAGNOSIS — I481 Persistent atrial fibrillation: Secondary | ICD-10-CM | POA: Insufficient documentation

## 2018-02-19 LAB — BASIC METABOLIC PANEL
ANION GAP: 9 (ref 5–15)
BUN: 18 mg/dL (ref 8–23)
CO2: 31 mmol/L (ref 22–32)
Calcium: 9.9 mg/dL (ref 8.9–10.3)
Chloride: 99 mmol/L (ref 98–111)
Creatinine, Ser: 0.89 mg/dL (ref 0.44–1.00)
GFR calc Af Amer: >60 mL/min (ref >60)
GFR calc non Af Amer: >60 mL/min (ref >60)
GLUCOSE: 119 mg/dL — AB (ref 70–99)
POTASSIUM: 4.4 mmol/L (ref 3.5–5.1)
Sodium: 139 mmol/L (ref 135–145)

## 2018-02-19 LAB — CBC
HCT: 44.1 % (ref 36.0–46.0)
HEMOGLOBIN: 14.6 g/dL (ref 12.0–15.0)
MCH: 31.9 pg (ref 26.0–34.0)
MCHC: 33.1 g/dL (ref 30.0–36.0)
MCV: 96.5 fL (ref 78.0–100.0)
PLATELETS: 209 10*3/uL (ref 150–400)
RBC: 4.57 MIL/uL (ref 3.87–5.11)
RDW: 12 % (ref 11.5–15.5)
WBC: 8.2 10*3/uL (ref 4.0–10.5)

## 2018-02-19 NOTE — Progress Notes (Signed)
Pt in for EKG on increased dose of flecainide to 100 mg bid. Intervals on EKG are stable. Pt will be scheduled for cardioversion 7/11. Labs drawn today and questions answered re cardioversion. Reminded not to miss any doses of eliquis 5 mg bid.

## 2018-02-19 NOTE — H&P (View-Only) (Signed)
Pt in for EKG on increased dose of flecainide to 100 mg bid. Intervals on EKG are stable. Pt will be scheduled for cardioversion 7/11. Labs drawn today and questions answered re cardioversion. Reminded not to miss any doses of eliquis 5 mg bid.

## 2018-02-19 NOTE — Patient Instructions (Addendum)
Cardioversion scheduled for Thursday, July 11th  - Arrive at the [facility] and go to admitting at 11:30AM  -Do not eat or drink anything after midnight the night prior to your procedure.  - Take all your morning medications with a sip of water prior to arrival.  - You will not be able to drive home after your procedure.

## 2018-03-01 ENCOUNTER — Encounter (HOSPITAL_COMMUNITY)
Admission: RE | Disposition: A | Discharge status: Home / Self Care | Payer: Self-pay | Source: Ambulatory Visit | Attending: Cardiology

## 2018-03-01 ENCOUNTER — Ambulatory Visit (HOSPITAL_COMMUNITY)
Admission: RE | Admit: 2018-03-01 | Discharge: 2018-03-01 | Disposition: A | Discharge status: Home / Self Care | Payer: Medicare HMO | Source: Ambulatory Visit | Attending: Cardiology | Admitting: Cardiology

## 2018-03-01 ENCOUNTER — Encounter (HOSPITAL_COMMUNITY): Payer: Self-pay | Admitting: *Deleted

## 2018-03-01 ENCOUNTER — Ambulatory Visit (HOSPITAL_COMMUNITY): Payer: Medicare HMO | Admitting: Anesthesiology

## 2018-03-01 DIAGNOSIS — Z9889 Other specified postprocedural states: Secondary | ICD-10-CM | POA: Insufficient documentation

## 2018-03-01 DIAGNOSIS — Z885 Allergy status to narcotic agent status: Secondary | ICD-10-CM | POA: Insufficient documentation

## 2018-03-01 DIAGNOSIS — I481 Persistent atrial fibrillation: Secondary | ICD-10-CM

## 2018-03-01 DIAGNOSIS — Z6841 Body Mass Index (BMI) 40.0 and over, adult: Secondary | ICD-10-CM | POA: Insufficient documentation

## 2018-03-01 DIAGNOSIS — E785 Hyperlipidemia, unspecified: Secondary | ICD-10-CM | POA: Insufficient documentation

## 2018-03-01 DIAGNOSIS — Z85118 Personal history of other malignant neoplasm of bronchus and lung: Secondary | ICD-10-CM | POA: Diagnosis not present

## 2018-03-01 DIAGNOSIS — Z7902 Long term (current) use of antithrombotics/antiplatelets: Secondary | ICD-10-CM | POA: Diagnosis not present

## 2018-03-01 DIAGNOSIS — Z79899 Other long term (current) drug therapy: Secondary | ICD-10-CM | POA: Insufficient documentation

## 2018-03-01 DIAGNOSIS — E669 Obesity, unspecified: Secondary | ICD-10-CM | POA: Insufficient documentation

## 2018-03-01 DIAGNOSIS — Z7951 Long term (current) use of inhaled steroids: Secondary | ICD-10-CM | POA: Diagnosis not present

## 2018-03-01 DIAGNOSIS — Z882 Allergy status to sulfonamides status: Secondary | ICD-10-CM | POA: Diagnosis not present

## 2018-03-01 DIAGNOSIS — I10 Essential (primary) hypertension: Secondary | ICD-10-CM | POA: Insufficient documentation

## 2018-03-01 HISTORY — PX: CARDIOVERSION: SHX1299

## 2018-03-01 SURGERY — CARDIOVERSION
Anesthesia: General

## 2018-03-01 MED ORDER — PROPOFOL 10 MG/ML IV BOLUS
INTRAVENOUS | Status: DC | PRN
  Administered 2018-03-01: 30 mg via INTRAVENOUS
  Administered 2018-03-01: 10 mg via INTRAVENOUS
  Administered 2018-03-01: 20 mg via INTRAVENOUS

## 2018-03-01 MED ORDER — SODIUM CHLORIDE 0.9 % IV SOLN
INTRAVENOUS | Status: DC | PRN
  Administered 2018-03-01: 12:00:00 via INTRAVENOUS

## 2018-03-01 NOTE — Discharge Instructions (Signed)
Electrical Cardioversion, Care After °This sheet gives you information about how to care for yourself after your procedure. Your health care provider may also give you more specific instructions. If you have problems or questions, contact your health care provider. °What can I expect after the procedure? °After the procedure, it is common to have: °· Some redness on the skin where the shocks were given. ° °Follow these instructions at home: °· Do not drive for 24 hours if you were given a medicine to help you relax (sedative). °· Take over-the-counter and prescription medicines only as told by your health care provider. °· Ask your health care provider how to check your pulse. Check it often. °· Rest for 48 hours after the procedure or as told by your health care provider. °· Avoid or limit your caffeine use as told by your health care provider. °Contact a health care provider if: °· You feel like your heart is beating too quickly or your pulse is not regular. °· You have a serious muscle cramp that does not go away. °Get help right away if: °· You have discomfort in your chest. °· You are dizzy or you feel faint. °· You have trouble breathing or you are short of breath. °· Your speech is slurred. °· You have trouble moving an arm or leg on one side of your body. °· Your fingers or toes turn cold or blue. °This information is not intended to replace advice given to you by your health care provider. Make sure you discuss any questions you have with your health care provider. °Document Released: 05/29/2013 Document Revised: 03/11/2016 Document Reviewed: 02/12/2016 °Elsevier Interactive Patient Education © 2018 [employer] ° °

## 2018-03-01 NOTE — Anesthesia Preprocedure Evaluation (Addendum)
Anesthesia Evaluation  Patient identified by MRN, date of birth, ID band Patient awake    Reviewed: Allergy & Precautions, NPO status , Patient's Chart, lab work & pertinent test results  Airway Mallampati: III       Dental  (+) Teeth Intact   Pulmonary neg pulmonary ROS,    Pulmonary exam normal        Cardiovascular hypertension, Pt. on medications  Rhythm:Irregular Rate:Normal  ECG: A-fib, rate 66   Neuro/Psych negative neurological ROS  negative psych ROS   GI/Hepatic negative GI ROS, Neg liver ROS,   Endo/Other  Morbid obesitySuper obese  Renal/GU negative Renal ROS     Musculoskeletal negative musculoskeletal ROS (+)   Abdominal (+) + obese,   Peds  Hematology HLD   Anesthesia Other Findings A-FIB  Reproductive/Obstetrics                           Anesthesia Physical Anesthesia Plan  ASA: IV  Anesthesia Plan: General   Post-op Pain Management:    Induction: Intravenous  PONV Risk Score and Plan: 3 and Propofol infusion and Treatment may vary due to age or medical condition  Airway Management Planned:   Additional Equipment:   Intra-op Plan:   Post-operative Plan:   Informed Consent: I have reviewed the patients History and Physical, chart, labs and discussed the procedure including the risks, benefits and alternatives for the proposed anesthesia with the patient or authorized representative who has indicated his/her understanding and acceptance.   Dental advisory given  Plan Discussed with: CRNA  Anesthesia Plan Comments:         Anesthesia Quick Evaluation

## 2018-03-01 NOTE — Transfer of Care (Signed)
Immediate Anesthesia Transfer of Care Note  Patient: Leah Velasquez  Procedure(s) Performed: CARDIOVERSION (N/A )  Patient Location: Endoscopy Unit  Anesthesia Type:General  Level of Consciousness: awake, alert  and oriented  Airway & Oxygen Therapy: Patient Spontanous Breathing  Post-op Assessment: Report given to RN and Post -op Vital signs reviewed and stable  Post vital signs: Reviewed and stable  Last Vitals:  Vitals Value Taken Time  BP 102/54 03/01/2018 12:30 PM  Temp    Pulse 58 03/01/2018 12:32 PM  Resp 21 03/01/2018 12:32 PM  SpO2 96 % 03/01/2018 12:32 PM  Vitals shown include unvalidated device data.  Last Pain:  Vitals:   03/01/18 1144  TempSrc: Oral  PainSc: 6          Complications: No apparent anesthesia complications

## 2018-03-01 NOTE — CV Procedure (Signed)
   Electrical Cardioversion Procedure Note EMONNIE CANNADY [id number] February 12, 1941  Procedure: Electrical Cardioversion Indications:  Atrial Fibrillation  Time Out: Verified patient identification, verified procedure,medications/allergies/relevent history reviewed, required imaging and test results available.  Performed  Procedure Details  The patient was NPO after midnight. Anesthesia was administered at the beside  by Dr.Moser with 60mg  of propofol.  Cardioversion was done with synchronized biphasic defibrillation with AP pads with 150 watts.  The patient converted to normal sinus rhythm. The patient tolerated the procedure well   IMPRESSION:  Successful cardioversion of atrial fibrillation    Calisa Luckenbaugh 03/01/2018, 11:33 AM

## 2018-03-01 NOTE — Anesthesia Postprocedure Evaluation (Signed)
Anesthesia Post Note  Patient: Leah Velasquez  Procedure(s) Performed: CARDIOVERSION (N/A )     Patient location during evaluation: PACU Anesthesia Type: General Level of consciousness: awake and alert Pain management: pain level controlled Vital Signs Assessment: post-procedure vital signs reviewed and stable Respiratory status: spontaneous breathing, nonlabored ventilation, respiratory function stable and patient connected to nasal cannula oxygen Cardiovascular status: blood pressure returned to baseline and stable Postop Assessment: no apparent nausea or vomiting Anesthetic complications: no    Last Vitals:  Vitals:   03/01/18 1144 03/01/18 1210  BP: (!) 107/44 (!) 123/51  Pulse: (!) 56 (!) 56  Resp: (!) 22 19  Temp: 37.2 C 37.1 C  SpO2: 96% 96%    Last Pain:  Vitals:   03/01/18 1210  TempSrc: Oral  PainSc: 0-No pain                 Ryan P Ellender

## 2018-03-01 NOTE — Interval H&P Note (Signed)
History and Physical Interval Note:  03/01/2018 12:08 PM  Leah Velasquez  has presented today for surgery, with the diagnosis of A-FIB  The various methods of treatment have been discussed with the patient and family. After consideration of risks, benefits and other options for treatment, the patient has consented to  Procedure(s): CARDIOVERSION (N/A) as a surgical intervention .  The patient's history has been reviewed, patient examined, no change in status, stable for surgery.  I have reviewed the patient's chart and labs.  Questions were answered to the patient's satisfaction.     Fransico Him

## 2018-03-01 NOTE — Anesthesia Procedure Notes (Signed)
Procedure Name: General with mask airway Date/Time: 03/01/2018 12:18 PM Performed by: Kyung Rudd, CRNA Pre-anesthesia Checklist: Patient identified, Emergency Drugs available, Suction available, Patient being monitored and Timeout performed Patient Re-evaluated:Patient Re-evaluated prior to induction Oxygen Delivery Method: Ambu bag Preoxygenation: Pre-oxygenation with 100% oxygen Induction Type: IV induction Dental Injury: Teeth and Oropharynx as per pre-operative assessment

## 2018-03-05 ENCOUNTER — Other Ambulatory Visit: Payer: Self-pay

## 2018-03-05 ENCOUNTER — Encounter (HOSPITAL_COMMUNITY): Payer: Self-pay

## 2018-03-05 DIAGNOSIS — Z85118 Personal history of other malignant neoplasm of bronchus and lung: Secondary | ICD-10-CM | POA: Insufficient documentation

## 2018-03-05 DIAGNOSIS — Z79899 Other long term (current) drug therapy: Secondary | ICD-10-CM | POA: Diagnosis not present

## 2018-03-05 DIAGNOSIS — K649 Unspecified hemorrhoids: Secondary | ICD-10-CM | POA: Diagnosis not present

## 2018-03-05 DIAGNOSIS — I10 Essential (primary) hypertension: Secondary | ICD-10-CM | POA: Insufficient documentation

## 2018-03-05 DIAGNOSIS — K625 Hemorrhage of anus and rectum: Secondary | ICD-10-CM | POA: Diagnosis present

## 2018-03-05 DIAGNOSIS — N3000 Acute cystitis without hematuria: Secondary | ICD-10-CM | POA: Insufficient documentation

## 2018-03-05 LAB — COMPREHENSIVE METABOLIC PANEL
ALBUMIN: 3.8 g/dL (ref 3.5–5.0)
ALT: 68 U/L — AB (ref 0–44)
AST: 47 U/L — ABNORMAL HIGH (ref 15–41)
Alkaline Phosphatase: 133 U/L — ABNORMAL HIGH (ref 38–126)
Anion gap: 12 (ref 5–15)
BUN: 21 mg/dL (ref 8–23)
CHLORIDE: 105 mmol/L (ref 98–111)
CO2: 27 mmol/L (ref 22–32)
Calcium: 9.8 mg/dL (ref 8.9–10.3)
Creatinine, Ser: 0.83 mg/dL (ref 0.44–1.00)
GFR calc non Af Amer: >60 mL/min (ref >60)
GLUCOSE: 130 mg/dL — AB (ref 70–99)
Potassium: 4.3 mmol/L (ref 3.5–5.1)
SODIUM: 144 mmol/L (ref 135–145)
Total Bilirubin: 0.4 mg/dL (ref 0.3–1.2)
Total Protein: 7.3 g/dL (ref 6.5–8.1)

## 2018-03-05 LAB — CBC
HCT: 42.2 % (ref 36.0–46.0)
HEMOGLOBIN: 14 g/dL (ref 12.0–15.0)
MCH: 32.4 pg (ref 26.0–34.0)
MCHC: 33.2 g/dL (ref 30.0–36.0)
MCV: 97.7 fL (ref 78.0–100.0)
Platelets: 218 10*3/uL (ref 150–400)
RBC: 4.32 MIL/uL (ref 3.87–5.11)
RDW: 12.7 % (ref 11.5–15.5)
WBC: 6.4 10*3/uL (ref 4.0–10.5)

## 2018-03-05 LAB — PROTIME-INR
INR: 1.09
PROTHROMBIN TIME: 14.1 s (ref 11.4–15.2)

## 2018-03-05 NOTE — ED Triage Notes (Signed)
PT C/O RECTAL BLEEDING AND DIARRHEA SINCE MID-DAY TODAY. PT STS SHE IS TAKING COLCRYS FOR GOUT, WHICH MAKES HER HAVE DIARRHEA. PT NOTICED WITH THE 2ND DIARRHEA, SHE HAD BLOOD MIXED WITH THE STOOL. PT ALSO TAKES ELIQUIS FOR AFIB. PT HAS KNOWN HEMORRHOIDS, BUT ACCORDING TO HER, THEY NEVER BLEED BEFORE. PT STS HER RECTUM IS SORE, AS WELL.

## 2018-03-06 ENCOUNTER — Emergency Department (HOSPITAL_COMMUNITY)
Admission: EM | Admit: 2018-03-06 | Discharge: 2018-03-06 | Disposition: A | Discharge status: Home / Self Care | Payer: Medicare HMO | Attending: Emergency Medicine | Admitting: Emergency Medicine

## 2018-03-06 DIAGNOSIS — K649 Unspecified hemorrhoids: Secondary | ICD-10-CM

## 2018-03-06 DIAGNOSIS — N3000 Acute cystitis without hematuria: Secondary | ICD-10-CM

## 2018-03-06 HISTORY — DX: Unspecified hemorrhoids: K64.9

## 2018-03-06 LAB — URINALYSIS, DIPSTICK ONLY
Bilirubin Urine: NEGATIVE
GLUCOSE, UA: NEGATIVE mg/dL
Ketones, ur: NEGATIVE mg/dL
Nitrite: POSITIVE — AB
PH: 5 (ref 5.0–8.0)
Protein, ur: NEGATIVE mg/dL
Specific Gravity, Urine: 1.018 (ref 1.005–1.030)

## 2018-03-06 LAB — POC OCCULT BLOOD, ED: Fecal Occult Bld: POSITIVE — AB

## 2018-03-06 LAB — TYPE AND SCREEN
ABO/RH(D): AB POS
Antibody Screen: NEGATIVE

## 2018-03-06 LAB — ABO/RH: ABO/RH(D): AB POS

## 2018-03-06 MED ORDER — CIPROFLOXACIN HCL 500 MG PO TABS: 500.0000 mg | ORAL_TABLET | Freq: Two times a day (BID) | ORAL | 0 refills | Status: DC

## 2018-03-06 MED ORDER — HYDROCORTISONE 2.5 % RE CREA: TOPICAL_CREAM | RECTAL | 1 refills | Status: DC

## 2018-03-06 NOTE — Discharge Instructions (Signed)
Anusol cream rotated every 4 hours with preparation H.  Cipro as prescribed.  Return to the emergency department if symptoms significantly worsen or change.

## 2018-03-06 NOTE — ED Provider Notes (Signed)
[facility] DEPT Provider Note   CSN: [id number] Arrival date & time: 03/05/18  1841     History   Chief Complaint Chief Complaint  Patient presents with  . Rectal Bleeding    HPI Leah Velasquez is a 77 y.o. female.  Patient is a 77 year old female with past medical history of hypertension, hyperlipidemia, atrial fibrillation for which she is on Eliquis.  She presents today with complaints of rectal bleeding.  She states that she has been taking Colcrys for gout which causes her to have loose stools.  She has been wiping frequently and believe she irritated her hemorrhoids.  She denies any abdominal or rectal pain.  The history is provided by the patient.  Rectal Bleeding  Quality:  Bright red Amount:  Moderate Duration:  1 hour Timing:  Constant Context: hemorrhoids   Relieved by:  Nothing Worsened by:  Wiping Ineffective treatments:  None tried   Past Medical History:  Diagnosis Date  . Cancer ([facility])    lung cancer  . Hemorrhoids   . Hyperlipidemia   . Hypertension   . Obesity   . Persistent atrial fibrillation [facility])     Patient Active Problem List   Diagnosis Date Noted  . CAP (community acquired pneumonia) 10/22/2017  . Acute respiratory failure with hypoxia ([place]) 10/22/2017  . Atrial fibrillation ([place]) 10/22/2017  . Generalized weakness 10/22/2017  . Essential hypertension 10/22/2017  . Thrombocytopenia ([place]) 10/22/2017  . Hyperlipidemia 10/22/2017    Past Surgical History:  Procedure Laterality Date  . BREAST EXCISIONAL BIOPSY Left    x 2  . BREAST EXCISIONAL BIOPSY Right   . CARDIOVERSION N/A 01/09/2018   Procedure: CARDIOVERSION;  Surgeon: Josue Hector, MD;  Location: [facility] ENDOSCOPY;  Service: Cardiovascular;  Laterality: N/A;  . CARDIOVERSION N/A 03/01/2018   Procedure: CARDIOVERSION;  Surgeon: Sueanne Margarita, MD;  Location: [facility] ENDOSCOPY;  Service: Cardiovascular;  Laterality: N/A;     OB History   None       Home Medications    Prior to Admission medications   Medication Sig Start Date End Date Taking? Authorizing Provider  albuterol (PROVENTIL HFA;VENTOLIN HFA) 108 (90 Base) MCG/ACT inhaler Inhale 2 puffs into the lungs every 6 (six) hours as needed for wheezing or shortness of breath. 10/26/17   Doreatha Lew, MD  amoxicillin (AMOXIL) 500 MG capsule Take 500 mg by mouth daily. 02/26/18   [provider]  apixaban (ELIQUIS) 5 MG TABS tablet Take 1 tablet (5 mg total) by mouth 2 (two) times daily. 01/18/18   Sherran Needs, NP  calcium carbonate (TUMS - DOSED IN MG ELEMENTAL CALCIUM) 500 MG chewable tablet Chew 1 tablet (200 mg of elemental calcium total) by mouth 3 (three) times daily as needed for indigestion or heartburn. 10/26/17   Doreatha Lew, MD  carboxymethylcellulose (EQ RESTORE TEARS) 0.5 % SOLN Place 1 drop into both eyes daily as needed (for dry eyes).    [provider]  Cholecalciferol (VITAMIN D3) 1000 units CAPS Take 1,000 Units by mouth daily.    [provider]  colchicine (COLCRYS) 0.6 MG tablet Take 0.6 mg by mouth daily as needed (for gout flare up).  03/27/17   [provider]  diltiazem (CARDIZEM CD) 120 MG 24 hr capsule Take 1 capsule (120 mg total) by mouth at bedtime. 01/18/18 01/18/19  Sherran Needs, NP  flecainide (TAMBOCOR) 50 MG tablet Take 2 tablets (100 mg total) by mouth 2 (two) times  daily. 02/14/18   Sherran Needs, NP  fluticasone Asencion Islam) 50 MCG/ACT nasal spray Place 1 spray into both nostrils daily.  08/30/17   [provider]  furosemide (LASIX) 20 MG tablet Take 20 mg by mouth daily as needed for fluid or edema.  10/18/16   [provider]  levocetirizine (XYZAL) 5 MG tablet Take 5 mg by mouth every evening.  09/22/17   [provider]  losartan (COZAAR) 50 MG tablet Take 1 tablet (50 mg total) by mouth daily. 11/24/17   Lorretta Harp, MD  omeprazole (PRILOSEC) 20 MG capsule Take 20 mg by  mouth daily.    [provider]  simvastatin (ZOCOR) 20 MG tablet Take 20 mg by mouth at bedtime. 07/17/17   [provider]  timolol (TIMOPTIC) 0.5 % ophthalmic solution Place 1 drop into both eyes 2 (two) times daily. 09/13/17   [provider]  traMADol (ULTRAM) 50 MG tablet Take 50 mg by mouth daily as needed for moderate pain.  10/03/17   [provider]  zolpidem (AMBIEN) 5 MG tablet Take 5 mg by mouth at bedtime as needed for sleep.  07/17/17   [provider]    Family History History reviewed. No pertinent family history.  Social History Social History   Tobacco Use  . Smoking status: Never Smoker  . Smokeless tobacco: Never Used  Substance Use Topics  . Alcohol use: No    Frequency: Never  . Drug use: No     Allergies   Morphine and Sulfa antibiotics   Review of Systems Review of Systems  Gastrointestinal: Positive for hematochezia.  All other systems reviewed and are negative.    Physical Exam Updated Vital Signs BP (!) 174/91 (BP Location: Right Arm)   Pulse 66   Temp 97.9 F (36.6 C) (Oral)   Resp 18   Ht 5\' 2"  (1.575 m)   Wt 117.9 kg (260 lb)   SpO2 90%   BMI 47.55 kg/m   Physical Exam  Constitutional: She is oriented to person, place, and time. She appears well-developed and well-nourished. No distress.  HENT:  Head: Normocephalic and atraumatic.  Neck: Normal range of motion. Neck supple.  Cardiovascular: Normal rate and regular rhythm. Exam reveals no gallop and no friction rub.  No murmur heard. Pulmonary/Chest: Effort normal and breath sounds normal. No respiratory distress. She has no wheezes.  Abdominal: Soft. Bowel sounds are normal. She exhibits no distension. There is no tenderness.  Genitourinary:  Genitourinary Comments: There are several hemorrhoids noted.  There is no active bleeding.  Rectal examination is otherwise unremarkable stool is brown in color.  Musculoskeletal: Normal range of  motion.  Neurological: She is alert and oriented to person, place, and time.  Skin: Skin is warm and dry. She is not diaphoretic.  Nursing note and vitals reviewed.    ED Treatments / Results  Labs (all labs ordered are listed, but only abnormal results are displayed) Labs Reviewed  COMPREHENSIVE METABOLIC PANEL - Abnormal; Notable for the following components:      Result Value   Glucose, Bld 130 (*)    AST 47 (*)    ALT 68 (*)    Alkaline Phosphatase 133 (*)    All other components within normal limits  CBC  PROTIME-INR  URINALYSIS, DIPSTICK ONLY  POC OCCULT BLOOD, ED  POC OCCULT BLOOD, ED  TYPE AND SCREEN  ABO/RH    EKG None  Radiology No results found.  Procedures Procedures (  including critical care time)  Medications Ordered in ED Medications - No data to display   Initial Impression / Assessment and Plan / ED Course  I have reviewed the triage vital signs and the nursing notes.  Pertinent labs & imaging results that were available during my care of the patient were reviewed by me and considered in my medical decision making (see chart for details).  Patient has rectal bleeding that appears to be from an external hemorrhoid.  There is no active bleeding and her rectal examination is otherwise unremarkable.  She will be treated with Anusol and Preparation H.  She also appears to have a urinary tract infection.  She will be treated with Cipro.  Final Clinical Impressions(s) / ED Diagnoses   Final diagnoses:  None    ED Discharge Orders    None       Veryl Speak, MD 03/06/18 [phone]

## 2018-03-08 ENCOUNTER — Encounter (HOSPITAL_COMMUNITY): Payer: Self-pay | Admitting: Nurse Practitioner

## 2018-03-08 ENCOUNTER — Ambulatory Visit (HOSPITAL_COMMUNITY)
Admission: RE | Admit: 2018-03-08 | Discharge: 2018-03-08 | Disposition: A | Discharge status: Home / Self Care | Payer: Medicare HMO | Source: Ambulatory Visit | Attending: Nurse Practitioner | Admitting: Nurse Practitioner

## 2018-03-08 VITALS — BP 142/78 | HR 42 | Ht 62.0 in | Wt 266.0 lb

## 2018-03-08 DIAGNOSIS — I10 Essential (primary) hypertension: Secondary | ICD-10-CM | POA: Insufficient documentation

## 2018-03-08 DIAGNOSIS — Z9889 Other specified postprocedural states: Secondary | ICD-10-CM | POA: Diagnosis not present

## 2018-03-08 DIAGNOSIS — E669 Obesity, unspecified: Secondary | ICD-10-CM | POA: Insufficient documentation

## 2018-03-08 DIAGNOSIS — Z882 Allergy status to sulfonamides status: Secondary | ICD-10-CM | POA: Insufficient documentation

## 2018-03-08 DIAGNOSIS — Z85118 Personal history of other malignant neoplasm of bronchus and lung: Secondary | ICD-10-CM | POA: Insufficient documentation

## 2018-03-08 DIAGNOSIS — Z885 Allergy status to narcotic agent status: Secondary | ICD-10-CM | POA: Diagnosis not present

## 2018-03-08 DIAGNOSIS — I481 Persistent atrial fibrillation: Secondary | ICD-10-CM | POA: Diagnosis present

## 2018-03-08 DIAGNOSIS — Z7901 Long term (current) use of anticoagulants: Secondary | ICD-10-CM | POA: Diagnosis not present

## 2018-03-08 DIAGNOSIS — I4819 Other persistent atrial fibrillation: Secondary | ICD-10-CM

## 2018-03-08 DIAGNOSIS — R001 Bradycardia, unspecified: Secondary | ICD-10-CM | POA: Insufficient documentation

## 2018-03-08 DIAGNOSIS — Z7952 Long term (current) use of systemic steroids: Secondary | ICD-10-CM | POA: Insufficient documentation

## 2018-03-08 DIAGNOSIS — Z79891 Long term (current) use of opiate analgesic: Secondary | ICD-10-CM | POA: Diagnosis not present

## 2018-03-08 DIAGNOSIS — Z79899 Other long term (current) drug therapy: Secondary | ICD-10-CM | POA: Diagnosis not present

## 2018-03-08 DIAGNOSIS — Z792 Long term (current) use of antibiotics: Secondary | ICD-10-CM | POA: Insufficient documentation

## 2018-03-08 DIAGNOSIS — E785 Hyperlipidemia, unspecified: Secondary | ICD-10-CM | POA: Insufficient documentation

## 2018-03-08 MED ORDER — METOPROLOL SUCCINATE ER 25 MG PO TB24: 12.5000 mg | ORAL_TABLET | Freq: Every day | ORAL | 3 refills | Status: DC

## 2018-03-08 NOTE — Patient Instructions (Addendum)
Stop cardizem  Start metoprolol 1/2 tablet once a day at bedtime  Continue lasix 40mg  for 2 more days

## 2018-03-08 NOTE — Progress Notes (Signed)
Primary Care Physician: [name], MD Referring Physician: Dr. Ruffin Pyo Leah Velasquez is a 77 y.o. female with a h/o  HTN, lung cancer.  She was hospitalized on 10/21/17 for 4 days because of influenza A, pneumonia/COPD. She did smoke 20 years ago. She's never had a heart attack or stroke. She developed A. Fib during her hospitalization, was placed on carvedilol for rate control as well as Eliquis oral anticoagulation for stroke prophylaxis.she does feel somewhat weak and is walking with a walker now getting physical therapy.  Sheis on Eliquis and carvedilol.  She was set up for cardioversion but failed to convert.   She is in the afib clinic for options to restore SR. She is c/o coreg making her very fatigued. She is still weak from pneumonia, but explained to pt some of her fatigue maybe 2/2 afib as well. On last visit, she was changed to CCB with improvement in fatigue. She saw Chanetta Marshall, NP, on last visit and was started on  flecainide 50 mg bid. In the clinic today, she states that she is tolerating drug and EKG shows no adverse interval changes. However, she does continue in afib and will plan to increase to 100 mg bid and cardiovert in the near future.  F/u in fib clinic, 7/18. She had successful cardioversion and is in sinus brady today at 44 bpm. She has felt short of breath since the first part of the week. 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 Her slow HR could also be contributing to her dyspnea. She was also seen in the ER the first of the week for bleeding hemorrhoids and UTI in the ER and is currently on Cipro.   Today, she denies symptoms of palpitations, chest pain, shortness of breath, orthopnea, PND, lower extremity edema, dizziness, presyncope, syncope, or neurologic sequela. The patient is tolerating medications without difficulties and is otherwise  without complaint today.   Past Medical History:  Diagnosis Date  . Cancer ([place])    lung cancer  . Hemorrhoids   . Hyperlipidemia   . Hypertension   . Obesity   . Persistent atrial fibrillation [facility])    Past Surgical History:  Procedure Laterality Date  . BREAST EXCISIONAL BIOPSY Left    x 2  . BREAST EXCISIONAL BIOPSY Right   . CARDIOVERSION N/A 01/09/2018   Procedure: CARDIOVERSION;  Surgeon: Josue Hector, MD;  Location: [facility] ENDOSCOPY;  Service: Cardiovascular;  Laterality: N/A;  . CARDIOVERSION N/A 03/01/2018   Procedure: CARDIOVERSION;  Surgeon: Sueanne Margarita, MD;  Location: [facility] ENDOSCOPY;  Service: Cardiovascular;  Laterality: N/A;    Current Outpatient Medications  Medication Sig Dispense Refill  . albuterol (PROVENTIL HFA;VENTOLIN HFA) 108 (90 Base) MCG/ACT inhaler Inhale 2 puffs into the lungs every 6 (six) hours as needed for wheezing or shortness of breath. 1 Inhaler 0  . apixaban (ELIQUIS) 5 MG TABS tablet Take 1 tablet (5 mg total) by mouth 2 (two) times daily. 180 tablet 2  . calcium carbonate (TUMS - DOSED IN MG ELEMENTAL CALCIUM) 500 MG chewable tablet Chew 1 tablet (200 mg of elemental calcium total) by mouth 3 (three) times daily as needed for indigestion or heartburn.    . carboxymethylcellulose (EQ RESTORE TEARS) 0.5 % SOLN Place 1 drop into both eyes daily as needed (  for dry eyes).    . Cholecalciferol (VITAMIN D3) 1000 units CAPS Take 1,000 Units by mouth daily.    . ciprofloxacin (CIPRO) 500 MG tablet Take 1 tablet (500 mg total) by mouth 2 (two) times daily. One po bid x 7 days 14 tablet 0  . colchicine (COLCRYS) 0.6 MG tablet Take 0.6 mg by mouth daily as needed (for gout flare up).     . flecainide (TAMBOCOR) 50 MG tablet Take 2 tablets (100 mg total) by mouth 2 (two) times daily. 60 tablet 3  . fluticasone (FLONASE) 50 MCG/ACT nasal spray Place 1 spray into both nostrils daily.     . furosemide (LASIX) 20 MG tablet Take 20 mg by mouth daily as needed for  fluid or edema.     . hydrocortisone (ANUSOL-HC) 2.5 % rectal cream Apply rectally 2 times daily 28.35 g 1  . levocetirizine (XYZAL) 5 MG tablet Take 5 mg by mouth every evening.     Marland Kitchen losartan (COZAAR) 50 MG tablet Take 1 tablet (50 mg total) by mouth daily. 30 tablet 6  . omeprazole (PRILOSEC) 20 MG capsule Take 20 mg by mouth daily.    . simvastatin (ZOCOR) 20 MG tablet Take 20 mg by mouth at bedtime.    . timolol (TIMOPTIC) 0.5 % ophthalmic solution Place 1 drop into both eyes 2 (two) times daily.    . traMADol (ULTRAM) 50 MG tablet Take 50 mg by mouth daily as needed for moderate pain.   0  . zolpidem (AMBIEN) 5 MG tablet Take 5 mg by mouth at bedtime as needed for sleep.     . metoprolol succinate (TOPROL XL) 25 MG 24 hr tablet Take 0.5 tablets (12.5 mg total) by mouth at bedtime. 15 tablet 3   No current facility-administered medications for this encounter.     Allergies  Allergen Reactions  . Morphine Anaphylaxis  . Sulfa Antibiotics Rash    Social History   Socioeconomic History  . Marital status: Married    Spouse name: Not on file  . Number of children: Not on file  . Years of education: Not on file  . Highest education level: Not on file  Occupational History  . Not on file  Social Needs  . Financial resource strain: Not on file  . Food insecurity:    Worry: Not on file    Inability: Not on file  . Transportation needs:    Medical: Not on file    Non-medical: Not on file  Tobacco Use  . Smoking status: Never Smoker  . Smokeless tobacco: Never Used  Substance and Sexual Activity  . Alcohol use: No    Frequency: Never  . Drug use: No  . Sexual activity: Not on file  Lifestyle  . Physical activity:    Days per week: Not on file    Minutes per session: Not on file  . Stress: Not on file  Relationships  . Social connections:    Talks on phone: Not on file    Gets together: Not on file    Attends religious service: Not on file    Active member of club or  organization: Not on file    Attends meetings of clubs or organizations: Not on file    Relationship status: Not on file  . Intimate partner violence:    Fear of current or ex partner: Not on file    Emotionally abused: Not on file    Physically abused: Not on file  Forced sexual activity: Not on file  Other Topics Concern  . Not on file  Social History Narrative  . Not on file    History reviewed. No pertinent family history.  ROS- All systems are reviewed and negative except as per the HPI above  Physical Exam: Vitals:   03/08/18 1408  BP: (!) 142/78  Pulse: (!) 42  SpO2: 95%  Weight: 266 lb (120.7 kg)  Height: 5\' 2"  (1.575 m)   Wt Readings from Last 3 Encounters:  03/08/18 266 lb (120.7 kg)  03/05/18 260 lb (117.9 kg)  03/01/18 260 lb (117.9 kg)    Labs: Lab Results  Component Value Date   NA 144 03/05/2018   K 4.3 03/05/2018   CL 105 03/05/2018   CO2 27 03/05/2018   GLUCOSE 130 (H) 03/05/2018   BUN 21 03/05/2018   CREATININE 0.83 03/05/2018   CALCIUM 9.8 03/05/2018   Lab Results  Component Value Date   INR 1.09 03/05/2018   No results found for: CHOL, HDL, LDLCALC, TRIG   GEN- The patient is well appearing, alert and oriented x 3 today.   Head- normocephalic, atraumatic Eyes-  Sclera clear, conjunctiva pink Ears- hearing intact Oropharynx- clear Neck- supple, no JVP Lymph- no cervical lymphadenopathy Lungs- Clear to ausculation bilaterally, normal work of breathing Heart- irregular rate and rhythm, no murmurs, rubs or gallops, PMI not laterally displaced GI- soft, NT, ND, + BS Extremities- no clubbing, cyanosis, or edema MS- no significant deformity or atrophy Skin- no rash or lesion Psych- euthymic mood, full affect Neuro- strength and sensation are intact  EKG-Sinus brady at 44 bpm, PR int 222 ms, qrs int 72 ms, qtc 400 ms Epic records reviewed Echo-Study Conclusions  - Left ventricle: The cavity size was normal. There was mild    concentric hypertrophy. Systolic function was normal. The   estimated ejection fraction was in the range of 60% to 65%. Wall   motion was normal; there were no regional wall motion   abnormalities. - Aortic valve: There was no regurgitation. - Mitral valve: There was trivial regurgitation. - Right ventricle: Systolic function was normal. - Right atrium: The atrium was normal in size. - Tricuspid valve: There was no regurgitation. - Pulmonary arteries: Systolic pressure could not be accurately   estimated. - Inferior vena cava: The vessel was normal in size. - Pericardium, extracardiac: There was no pericardial effusion.     Assessment and Plan: 1. Persistent afib Successful cardioversion after loading on flecainide  She now has bradycardia and her c/o shortness of breath may be related She is only on 120 mg Cardizem and needs to be on some rate control drug to oppose flecainide to prevent 1:1 atrial flutter is it should occur I will stop Cardizem and start metoprolol ER 25 mg 1/2 tab at hs Will plan on stress test when rhythm is stablilized She will continue Eliquis 5 mg bid for chadsvasc score of 3, reminded not to miss doses  F/u one week  Butch Penny C. Hol[place] Spano, [facility] [address] [place],  [address] [phone]

## 2018-03-13 ENCOUNTER — Ambulatory Visit (HOSPITAL_COMMUNITY): Payer: Medicare HMO | Admitting: Nurse Practitioner

## 2018-03-14 NOTE — Addendum Note (Signed)
Encounter addended by: Sherran Needs, NP on: 03/14/2018 2:14 PM  Actions taken: LOS modified

## 2018-03-19 ENCOUNTER — Ambulatory Visit (HOSPITAL_COMMUNITY)
Admission: RE | Admit: 2018-03-19 | Discharge: 2018-03-19 | Disposition: A | Discharge status: Home / Self Care | Payer: Medicare HMO | Source: Ambulatory Visit | Attending: Nurse Practitioner | Admitting: Nurse Practitioner

## 2018-03-19 ENCOUNTER — Encounter (HOSPITAL_COMMUNITY): Payer: Self-pay | Admitting: Nurse Practitioner

## 2018-03-19 VITALS — BP 124/76 | HR 71 | Ht 62.0 in | Wt 263.0 lb

## 2018-03-19 DIAGNOSIS — Z85118 Personal history of other malignant neoplasm of bronchus and lung: Secondary | ICD-10-CM | POA: Insufficient documentation

## 2018-03-19 DIAGNOSIS — I481 Persistent atrial fibrillation: Secondary | ICD-10-CM | POA: Insufficient documentation

## 2018-03-19 DIAGNOSIS — Z7901 Long term (current) use of anticoagulants: Secondary | ICD-10-CM | POA: Insufficient documentation

## 2018-03-19 DIAGNOSIS — E669 Obesity, unspecified: Secondary | ICD-10-CM | POA: Diagnosis not present

## 2018-03-19 DIAGNOSIS — I10 Essential (primary) hypertension: Secondary | ICD-10-CM | POA: Diagnosis not present

## 2018-03-19 DIAGNOSIS — Z7951 Long term (current) use of inhaled steroids: Secondary | ICD-10-CM | POA: Diagnosis not present

## 2018-03-19 DIAGNOSIS — E785 Hyperlipidemia, unspecified: Secondary | ICD-10-CM | POA: Insufficient documentation

## 2018-03-19 DIAGNOSIS — Z79899 Other long term (current) drug therapy: Secondary | ICD-10-CM | POA: Diagnosis not present

## 2018-03-19 DIAGNOSIS — I4819 Other persistent atrial fibrillation: Secondary | ICD-10-CM

## 2018-03-19 NOTE — Patient Instructions (Signed)
Stop flecainide

## 2018-03-19 NOTE — Progress Notes (Signed)
Primary Care Physician: [name], MD Referring Physician: Dr. Ruffin Pyo Leah Velasquez is a 77 y.o. female with a h/o  HTN, lung cancer.  She was hospitalized on 10/21/17 for 4 days because of influenza A, pneumonia/COPD. She did smoke 20 years ago. She's never had a heart attack or stroke. She developed A. Fib during her hospitalization, was placed on carvedilol for rate control as well as Eliquis oral anticoagulation for stroke prophylaxis.she does feel somewhat weak and is walking with a walker now getting physical therapy.  Sheis on Eliquis and carvedilol.  She was set up for cardioversion but failed to convert.   She is in the afib clinic for options to restore SR. She is c/o coreg making her very fatigued. She is still weak from pneumonia, but explained to pt some of her fatigue maybe 2/2 afib as well. On last visit, she was changed to CCB with improvement in fatigue. She saw Chanetta Marshall, NP, on last visit and was started on  flecainide 50 mg bid. In the clinic today, she states that she is tolerating drug and EKG shows no adverse interval changes. However, she does continue in afib and will plan to increase to 100 mg bid and cardiovert in the near future.  F/u in fib clinic, 7/18. She had successful cardioversion and is in sinus brady today at 44 bpm. She has felt short of breath since the first part of the week. 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 Her slow HR could also be contributing to her dyspnea. She was also seen in the ER the first of the week for bleeding hemorrhoids and UTI in the ER and is currently on Cipro.   F/u in the afib clinic, 7/29. She has gone back to rate controlled afib.She was unaware. She feels better with change to lose dose BB.   Today, she denies symptoms of palpitations, chest pain, shortness of breath, orthopnea, PND, lower extremity  edema, dizziness, presyncope, syncope, or neurologic sequela. The patient is tolerating medications without difficulties and is otherwise without complaint today.   Past Medical History:  Diagnosis Date  . Cancer ([place])    lung cancer  . Hemorrhoids   . Hyperlipidemia   . Hypertension   . Obesity   . Persistent atrial fibrillation [facility])    Past Surgical History:  Procedure Laterality Date  . BREAST EXCISIONAL BIOPSY Left    x 2  . BREAST EXCISIONAL BIOPSY Right   . CARDIOVERSION N/A 01/09/2018   Procedure: CARDIOVERSION;  Surgeon: Josue Hector, MD;  Location: [facility] ENDOSCOPY;  Service: Cardiovascular;  Laterality: N/A;  . CARDIOVERSION N/A 03/01/2018   Procedure: CARDIOVERSION;  Surgeon: Sueanne Margarita, MD;  Location: [facility] ENDOSCOPY;  Service: Cardiovascular;  Laterality: N/A;    Current Outpatient Medications  Medication Sig Dispense Refill  . albuterol (PROVENTIL HFA;VENTOLIN HFA) 108 (90 Base) MCG/ACT inhaler Inhale 2 puffs into the lungs every 6 (six) hours as needed for wheezing or shortness of breath. 1 Inhaler 0  . apixaban (ELIQUIS) 5 MG TABS tablet Take 1 tablet (5 mg total) by mouth 2 (two) times daily. 180 tablet 2  . calcium carbonate (TUMS - DOSED IN MG ELEMENTAL CALCIUM) 500 MG chewable tablet Chew 1 tablet (200 mg of elemental calcium total) by mouth 3 (three) times  daily as needed for indigestion or heartburn.    . carboxymethylcellulose (EQ RESTORE TEARS) 0.5 % SOLN Place 1 drop into both eyes daily as needed (for dry eyes).    . Cholecalciferol (VITAMIN D3) 1000 units CAPS Take 1,000 Units by mouth daily.    . colchicine (COLCRYS) 0.6 MG tablet Take 0.6 mg by mouth daily as needed (for gout flare up).     . fluticasone (FLONASE) 50 MCG/ACT nasal spray Place 1 spray into both nostrils daily.     . furosemide (LASIX) 20 MG tablet Take 20 mg by mouth daily.     . hydrocortisone (ANUSOL-HC) 2.5 % rectal cream Apply rectally 2 times daily 28.35 g 1  . levocetirizine (XYZAL)  5 MG tablet Take 5 mg by mouth every evening.     Marland Kitchen losartan (COZAAR) 50 MG tablet Take 1 tablet (50 mg total) by mouth daily. 30 tablet 6  . metoprolol succinate (TOPROL XL) 25 MG 24 hr tablet Take 0.5 tablets (12.5 mg total) by mouth at bedtime. 15 tablet 3  . omeprazole (PRILOSEC) 20 MG capsule Take 20 mg by mouth daily.    . simvastatin (ZOCOR) 20 MG tablet Take 20 mg by mouth at bedtime.    . timolol (TIMOPTIC) 0.5 % ophthalmic solution Place 1 drop into both eyes 2 (two) times daily.    . traMADol (ULTRAM) 50 MG tablet Take 50 mg by mouth daily as needed for moderate pain.   0  . zolpidem (AMBIEN) 5 MG tablet Take 5 mg by mouth at bedtime as needed for sleep.      No current facility-administered medications for this encounter.     Allergies  Allergen Reactions  . Morphine Anaphylaxis  . Sulfa Antibiotics Rash    Social History   Socioeconomic History  . Marital status: Married    Spouse name: Not on file  . Number of children: Not on file  . Years of education: Not on file  . Highest education level: Not on file  Occupational History  . Not on file  Social Needs  . Financial resource strain: Not on file  . Food insecurity:    Worry: Not on file    Inability: Not on file  . Transportation needs:    Medical: Not on file    Non-medical: Not on file  Tobacco Use  . Smoking status: Never Smoker  . Smokeless tobacco: Never Used  Substance and Sexual Activity  . Alcohol use: No    Frequency: Never  . Drug use: No  . Sexual activity: Not on file  Lifestyle  . Physical activity:    Days per week: Not on file    Minutes per session: Not on file  . Stress: Not on file  Relationships  . Social connections:    Talks on phone: Not on file    Gets together: Not on file    Attends religious service: Not on file    Active member of club or organization: Not on file    Attends meetings of clubs or organizations: Not on file    Relationship status: Not on file  . Intimate  partner violence:    Fear of current or ex partner: Not on file    Emotionally abused: Not on file    Physically abused: Not on file    Forced sexual activity: Not on file  Other Topics Concern  . Not on file  Social History Narrative  . Not on file  No family history on file.  ROS- All systems are reviewed and negative except as per the HPI above  Physical Exam: Vitals:   03/19/18 1408  BP: 124/76  Pulse: 71  SpO2: 95%  Weight: 263 lb (119.3 kg)  Height: 5\' 2"  (1.575 m)   Wt Readings from Last 3 Encounters:  03/19/18 263 lb (119.3 kg)  03/08/18 266 lb (120.7 kg)  03/05/18 260 lb (117.9 kg)    Labs: Lab Results  Component Value Date   NA 144 03/05/2018   K 4.3 03/05/2018   CL 105 03/05/2018   CO2 27 03/05/2018   GLUCOSE 130 (H) 03/05/2018   BUN 21 03/05/2018   CREATININE 0.83 03/05/2018   CALCIUM 9.8 03/05/2018   Lab Results  Component Value Date   INR 1.09 03/05/2018   No results found for: CHOL, HDL, LDLCALC, TRIG   GEN- The patient is well appearing, alert and oriented x 3 today.   Head- normocephalic, atraumatic Eyes-  Sclera clear, conjunctiva pink Ears- hearing intact Oropharynx- clear Neck- supple, no JVP Lymph- no cervical lymphadenopathy Lungs- Clear to ausculation bilaterally, normal work of breathing Heart- irregular rate and rhythm, no murmurs, rubs or gallops, PMI not laterally displaced GI- soft, NT, ND, + BS Extremities- no clubbing, cyanosis, or edema MS- no significant deformity or atrophy Skin- no rash or lesion Psych- euthymic mood, full affect Neuro- strength and sensation are intact  EKG-afib at 71 bpm,  , qrs int 82 ms, qtc 432 ms Epic records reviewed Echo-Study Conclusions  - Left ventricle: The cavity size was normal. There was mild   concentric hypertrophy. Systolic function was normal. The   estimated ejection fraction was in the range of 60% to 65%. Wall   motion was normal; there were no regional wall motion    abnormalities. - Aortic valve: There was no regurgitation. - Mitral valve: There was trivial regurgitation. - Right ventricle: Systolic function was normal. - Right atrium: The atrium was normal in size. - Tricuspid valve: There was no regurgitation. - Pulmonary arteries: Systolic pressure could not be accurately   estimated. - Inferior vena cava: The vessel was normal in size. - Pericardium, extracardiac: There was no pericardial effusion.     Assessment and Plan: 1. Persistent afib Successful cardioversion after loading on flecainide  Now has returned to afib, rate controlled, she is unaware and now admits to feeling at her baseline Cardizem was stopped 2/2 brady and  Metoprolol ER started at  25 mg 1/2 tab at hs, tolerating Long discussion re stopping flecainide, pursuing other antiarrythmic's or living in afib She would like to live in afib at this point as she is asymptomatic, will stop flecainde She will continue Eliquis 5 mg bid for chadsvasc score of 3, reminded not to miss doses  F/u one month  Butch Penny C. Seon Gaertner, [facility] [address] [place], [place] [address] [phone]

## 2018-03-19 NOTE — Addendum Note (Signed)
Encounter addended by: Sherran Needs, NP on: 03/19/2018 4:13 PM  Actions taken: LOS modified

## 2018-04-03 ENCOUNTER — Ambulatory Visit: Payer: Medicare HMO | Admitting: Cardiovascular Disease

## 2018-04-17 ENCOUNTER — Ambulatory Visit: Payer: Medicare HMO | Admitting: Cardiovascular Disease

## 2018-04-18 ENCOUNTER — Encounter (HOSPITAL_COMMUNITY): Payer: Self-pay | Admitting: Nurse Practitioner

## 2018-04-18 ENCOUNTER — Ambulatory Visit (HOSPITAL_COMMUNITY)
Admission: RE | Admit: 2018-04-18 | Discharge: 2018-04-18 | Disposition: A | Discharge status: Home / Self Care | Payer: Medicare HMO | Source: Ambulatory Visit | Attending: Nurse Practitioner | Admitting: Nurse Practitioner

## 2018-04-18 VITALS — BP 128/76 | HR 71 | Ht 62.0 in | Wt 261.0 lb

## 2018-04-18 DIAGNOSIS — Z882 Allergy status to sulfonamides status: Secondary | ICD-10-CM | POA: Diagnosis not present

## 2018-04-18 DIAGNOSIS — I481 Persistent atrial fibrillation: Secondary | ICD-10-CM | POA: Insufficient documentation

## 2018-04-18 DIAGNOSIS — Z79899 Other long term (current) drug therapy: Secondary | ICD-10-CM | POA: Diagnosis not present

## 2018-04-18 DIAGNOSIS — R9431 Abnormal electrocardiogram [ECG] [EKG]: Secondary | ICD-10-CM | POA: Insufficient documentation

## 2018-04-18 DIAGNOSIS — I10 Essential (primary) hypertension: Secondary | ICD-10-CM | POA: Diagnosis not present

## 2018-04-18 DIAGNOSIS — E785 Hyperlipidemia, unspecified: Secondary | ICD-10-CM | POA: Diagnosis not present

## 2018-04-18 DIAGNOSIS — I4819 Other persistent atrial fibrillation: Secondary | ICD-10-CM

## 2018-04-18 DIAGNOSIS — E669 Obesity, unspecified: Secondary | ICD-10-CM | POA: Insufficient documentation

## 2018-04-18 DIAGNOSIS — Z85118 Personal history of other malignant neoplasm of bronchus and lung: Secondary | ICD-10-CM | POA: Insufficient documentation

## 2018-04-18 DIAGNOSIS — I480 Paroxysmal atrial fibrillation: Secondary | ICD-10-CM

## 2018-04-18 DIAGNOSIS — Z885 Allergy status to narcotic agent status: Secondary | ICD-10-CM | POA: Insufficient documentation

## 2018-04-18 DIAGNOSIS — Z7901 Long term (current) use of anticoagulants: Secondary | ICD-10-CM | POA: Insufficient documentation

## 2018-04-18 DIAGNOSIS — R5383 Other fatigue: Secondary | ICD-10-CM | POA: Insufficient documentation

## 2018-04-18 LAB — BASIC METABOLIC PANEL
ANION GAP: 8 (ref 5–15)
BUN: 20 mg/dL (ref 8–23)
CHLORIDE: 100 mmol/L (ref 98–111)
CO2: 32 mmol/L (ref 22–32)
Calcium: 9.7 mg/dL (ref 8.9–10.3)
Creatinine, Ser: 0.9 mg/dL (ref 0.44–1.00)
GFR calc Af Amer: >60 mL/min (ref >60)
GFR calc non Af Amer: >60 mL/min (ref >60)
GLUCOSE: 103 mg/dL — AB (ref 70–99)
POTASSIUM: 3.8 mmol/L (ref 3.5–5.1)
Sodium: 140 mmol/L (ref 135–145)

## 2018-04-18 LAB — MAGNESIUM: Magnesium: 2.2 mg/dL (ref 1.7–2.4)

## 2018-04-18 MED ORDER — METOPROLOL SUCCINATE ER 25 MG PO TB24: 12.5000 mg | ORAL_TABLET | Freq: Every day | ORAL | 3 refills | Status: DC

## 2018-04-18 NOTE — Progress Notes (Signed)
Primary Care Physician: [name], MD Referring Physician: Dr. Ruffin Pyo Leah Velasquez is a 77 y.o. female with a h/o  HTN, lung cancer.  She was hospitalized on 10/21/17 for 4 days because of influenza A, pneumonia/COPD. She did smoke 20 years ago. She's never had a heart attack or stroke. She developed A. Fib during her hospitalization, was placed on carvedilol for rate control as well as Eliquis oral anticoagulation for stroke prophylaxis.she does feel somewhat weak and is walking with a walker now getting physical therapy.  Sheis on Eliquis and carvedilol.  She was set up for cardioversion but failed to convert.   She is in the afib clinic for options to restore SR. She is c/o coreg making her very fatigued. She is still weak from pneumonia, but explained to pt some of her fatigue maybe 2/2 afib as well. On last visit, she was changed to CCB with improvement in fatigue. She saw Chanetta Marshall, NP, on last visit and was started on  flecainide 50 mg bid. In the clinic today, she states that she is tolerating drug and EKG shows no adverse interval changes. However, she does continue in afib and will plan to increase to 100 mg bid and cardiovert in the near future.  F/u in fib clinic, 7/18. She had successful cardioversion and is in sinus brady today at 44 bpm. She has felt short of breath since the first part of the week. 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 Her slow HR could also be contributing to her dyspnea. She was also seen in the ER the first of the week for bleeding hemorrhoids and UTI in the ER and is currently on Cipro.   F/u in the afib clinic, 7/29. She has gone back to rate controlled afib.She was unaware. She feels better with change to lose dose BB.   F/u in afib clinic, she remains in rate controlled afib. 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 She increased her lasix to 40 mg daily, feom 20 mg , one month ago and has had some low blood pressures and lightheadedness over the last few months. Will check bmet today to make sure not dehydrated. She in now in the donut hole and forms were given for eliquis assistance.  Today, she denies symptoms of palpitations, chest pain, shortness of breath, orthopnea, PND, lower extremity edema, dizziness, presyncope, syncope, or neurologic sequela. The patient is tolerating medications without difficulties and is otherwise without complaint today.   Past Medical History:  Diagnosis Date  . Cancer ([place])    lung cancer  . Hemorrhoids   . Hyperlipidemia   . Hypertension   . Obesity   . Persistent atrial fibrillation [facility])    Past Surgical History:  Procedure Laterality Date  . BREAST EXCISIONAL BIOPSY Left    x 2  . BREAST EXCISIONAL BIOPSY Right   . CARDIOVERSION N/A 01/09/2018   Procedure: CARDIOVERSION;  Surgeon: Josue Hector, MD;  Location: [facility] ENDOSCOPY;  Service: Cardiovascular;  Laterality: N/A;  . CARDIOVERSION N/A 03/01/2018   Procedure: CARDIOVERSION;  Surgeon: Sueanne Margarita, MD;  Location: [facility] ENDOSCOPY;  Service: Cardiovascular;  Laterality: N/A;    Current Outpatient Medications  Medication  Sig Dispense Refill  . albuterol (PROVENTIL HFA;VENTOLIN HFA) 108 (90 Base) MCG/ACT inhaler Inhale 2 puffs into the lungs every 6 (six) hours as needed for wheezing or shortness of breath. 1 Inhaler 0  . apixaban (ELIQUIS) 5 MG TABS tablet Take 1 tablet (5 mg total) by mouth 2 (two) times daily. 180 tablet 2  . calcium carbonate (TUMS - DOSED IN MG ELEMENTAL CALCIUM) 500 MG chewable tablet Chew 1 tablet (200 mg of elemental calcium total) by mouth 3 (three) times daily as needed for indigestion or heartburn.    . carboxymethylcellulose (EQ RESTORE TEARS) 0.5 % SOLN Place 1 drop into both eyes daily as needed (for dry  eyes).    . Cholecalciferol (VITAMIN D3) 1000 units CAPS Take 1,000 Units by mouth daily.    . colchicine (COLCRYS) 0.6 MG tablet Take 0.6 mg by mouth daily as needed (for gout flare up every other day).     . fluticasone (FLONASE) 50 MCG/ACT nasal spray Place 1 spray into both nostrils daily.     . furosemide (LASIX) 20 MG tablet Take 20 mg by mouth daily.     . hydrocortisone (ANUSOL-HC) 2.5 % rectal cream Apply rectally 2 times daily 28.35 g 1  . levocetirizine (XYZAL) 5 MG tablet Take 5 mg by mouth every evening.     Marland Kitchen losartan (COZAAR) 50 MG tablet Take 1 tablet (50 mg total) by mouth daily. 30 tablet 6  . metoprolol succinate (TOPROL XL) 25 MG 24 hr tablet Take 0.5 tablets (12.5 mg total) by mouth at bedtime. 15 tablet 3  . omeprazole (PRILOSEC) 20 MG capsule Take 20 mg by mouth daily.    . simvastatin (ZOCOR) 20 MG tablet Take 20 mg by mouth at bedtime.    . timolol (TIMOPTIC) 0.5 % ophthalmic solution Place 1 drop into both eyes 2 (two) times daily.    . traMADol (ULTRAM) 50 MG tablet Take 50 mg by mouth daily as needed for moderate pain.   0  . zolpidem (AMBIEN) 5 MG tablet Take 5 mg by mouth at bedtime as needed for sleep.      No current facility-administered medications for this encounter.     Allergies  Allergen Reactions  . Morphine Anaphylaxis  . Sulfa Antibiotics Rash    Social History   Socioeconomic History  . Marital status: Married    Spouse name: Not on file  . Number of children: Not on file  . Years of education: Not on file  . Highest education level: Not on file  Occupational History  . Not on file  Social Needs  . Financial resource strain: Not on file  . Food insecurity:    Worry: Not on file    Inability: Not on file  . Transportation needs:    Medical: Not on file    Non-medical: Not on file  Tobacco Use  . Smoking status: Never Smoker  . Smokeless tobacco: Never Used  Substance and Sexual Activity  . Alcohol use: No    Frequency: Never  .  Drug use: No  . Sexual activity: Not on file  Lifestyle  . Physical activity:    Days per week: Not on file    Minutes per session: Not on file  . Stress: Not on file  Relationships  . Social connections:    Talks on phone: Not on file    Gets together: Not on file    Attends religious service: Not on file    Active  member of club or organization: Not on file    Attends meetings of clubs or organizations: Not on file    Relationship status: Not on file  . Intimate partner violence:    Fear of current or ex partner: Not on file    Emotionally abused: Not on file    Physically abused: Not on file    Forced sexual activity: Not on file  Other Topics Concern  . Not on file  Social History Narrative  . Not on file    No family history on file.  ROS- All systems are reviewed and negative except as per the HPI above  Physical Exam: Vitals:   04/18/18 1421  BP: 128/76  Pulse: 71  Weight: 118.4 kg  Height: 5\' 2"  (1.575 m)   Wt Readings from Last 3 Encounters:  04/18/18 118.4 kg  03/19/18 119.3 kg  03/08/18 120.7 kg    Labs: Lab Results  Component Value Date   NA 144 03/05/2018   K 4.3 03/05/2018   CL 105 03/05/2018   CO2 27 03/05/2018   GLUCOSE 130 (H) 03/05/2018   BUN 21 03/05/2018   CREATININE 0.83 03/05/2018   CALCIUM 9.8 03/05/2018   Lab Results  Component Value Date   INR 1.09 03/05/2018   No results found for: CHOL, HDL, LDLCALC, TRIG   GEN- The patient is well appearing, alert and oriented x 3 today.   Head- normocephalic, atraumatic Eyes-  Sclera clear, conjunctiva pink Ears- hearing intact Oropharynx- clear Neck- supple, no JVP Lymph- no cervical lymphadenopathy Lungs- Clear to ausculation bilaterally, normal work of breathing Heart- irregular rate and rhythm, no murmurs, rubs or gallops, PMI not laterally displaced GI- soft, NT, ND, + BS Extremities- no clubbing, cyanosis, or edema MS- no significant deformity or atrophy Skin- no rash or  lesion Psych- euthymic mood, full affect Neuro- strength and sensation are intact  EKG-afib at 71 bpm   Epic records reviewed Echo-Study Conclusions  - Left ventricle: The cavity size was normal. There was mild   concentric hypertrophy. Systolic function was normal. The   estimated ejection fraction was in the range of 60% to 65%. Wall   motion was normal; there were no regional wall motion   abnormalities. - Aortic valve: There was no regurgitation. - Mitral valve: There was trivial regurgitation. - Right ventricle: Systolic function was normal. - Right atrium: The atrium was normal in size. - Tricuspid valve: There was no regurgitation. - Pulmonary arteries: Systolic pressure could not be accurately   estimated. - Inferior vena cava: The vessel was normal in size. - Pericardium, extracardiac: There was no pericardial effusion.     Assessment and Plan: 1. Persistent afib Successful cardioversion after loading on flecainide  Returned to afib, rate controlled, she was unaware and admits to feeling at her baseline Cardizem was stopped 2/2 brady and  Metoprolol ER started at  25 mg 1/2 tab at hs, tolerating Long discussion re stopping flecainide, pursuing other antiarrythmic's or living in afib She would like to live in afib at this point as she is asymptomatic, stopped flecainde She will continue Eliquis 5 mg bid for chadsvasc score of 3, reminded not to miss doses Bmet/mag today   F/u with Dr. Gwenlyn Found 10/1 F/u  6 months  Butch Penny C. Ember Henrikson, [facility] [address] [name], [place] [address] [phone]

## 2018-04-18 NOTE — Progress Notes (Signed)
error 

## 2018-04-26 ENCOUNTER — Telehealth: Payer: Self-pay | Admitting: Cardiovascular Disease

## 2018-04-26 MED ORDER — WARFARIN SODIUM 5 MG PO TABS: 5.0000 mg | ORAL_TABLET | Freq: Every day | ORAL | 0 refills | Status: DC

## 2018-04-26 NOTE — Telephone Encounter (Signed)
SPOKE TO PATIENT . SHE STATES IT IS A COST ISSUE WITH MEDICATION. PATIENT AWARE  [facility]PHARMACIST WILL CALL HER WITH DETAILS OF THE SWTICH.  PATIENT WOULD LIKE MEDICATION TO BE CALLED INTO [employer] MAIL ORDER. RN INFORMED PATIENT THE PHARMACIST WILL HANDLE PRESCRIPTION ONCE SHE TALKS TO PATIENT.

## 2018-04-26 NOTE — Telephone Encounter (Signed)
° ° °  Patient wants to know if she can switch from Eliquis to Warfarin

## 2018-04-26 NOTE — Telephone Encounter (Signed)
Transition from Eliquis to warfarin  Instructions given:  Sept/30 - Eliquis 5mg  BID and warfarin 5mg  in PM  Oct/1 Eliquis 5mg  BID and warfarin 5mg  in PM  Oct/2 STOP taking Eliquis and warfarin 5mg  in PM  Oct/3 - warfarin 5mg  every evening  Oct/4 - INR check at [facility] office at [employer]

## 2018-05-15 ENCOUNTER — Other Ambulatory Visit (HOSPITAL_COMMUNITY): Payer: Self-pay | Admitting: *Deleted

## 2018-05-15 MED ORDER — METOPROLOL SUCCINATE ER 25 MG PO TB24: 12.5000 mg | ORAL_TABLET | Freq: Every day | ORAL | 3 refills | Status: DC

## 2018-05-22 ENCOUNTER — Encounter: Payer: Self-pay | Admitting: Cardiovascular Disease

## 2018-05-22 ENCOUNTER — Ambulatory Visit: Payer: Medicare HMO | Admitting: Cardiovascular Disease

## 2018-05-22 DIAGNOSIS — I10 Essential (primary) hypertension: Secondary | ICD-10-CM

## 2018-05-22 DIAGNOSIS — E7800 Pure hypercholesterolemia, unspecified: Secondary | ICD-10-CM

## 2018-05-22 DIAGNOSIS — I4819 Other persistent atrial fibrillation: Secondary | ICD-10-CM | POA: Diagnosis not present

## 2018-05-22 NOTE — Patient Instructions (Addendum)
Medication Instructions:  . Continue Eliquis until gone.  Next Tuesday start warfarin 5 mg once daily in the evenings. This should overlap your Eliquis for 3 days.   Come to the office the following Monday at 1:30 for your first blood check on the warfarin.   Labwork: none  Testing/Procedures: none  Follow-Up: We request that you follow-up in: 6 months with an extender and in 12 months with Dr Andria Rhein will receive a reminder letter in the mail two months in advance. If you don't receive a letter, please call our office to schedule the follow-up appointment.    Any Other Special Instructions Will Be Listed Below (If Applicable).      If you need a refill on your cardiac medications before your next appointment, please call your pharmacy.

## 2018-05-22 NOTE — Assessment & Plan Note (Signed)
History of PAF currently in atrial fibrillation with controlled ventricular response on Eliquis oral anticoagulation.  She underwent cardioversion by Dr. Radford Pax successfully in July and by the end of August she was back in A. fib.  She is relatively asymptomatic from this.  She wishes to switch from Eliquis to Coumadin anticoagulation because of financial issues which I do not have a problem with.  Her 2D echo reveals normal LV function with left atrial dimensions at the upper limit of normal.

## 2018-05-22 NOTE — Assessment & Plan Note (Signed)
History of essential hypertension her blood pressure measured today 130/76 on losartan and metoprolol.

## 2018-05-22 NOTE — Assessment & Plan Note (Signed)
History of hyperlipidemia on statin therapy followed by her PCP. 

## 2018-05-22 NOTE — Progress Notes (Signed)
05/22/2018 Leah Velasquez   03-13-1941  [id number]  Primary Physician Doreene Nest, Jannifer Rodney, MD Primary Cardiologist: Lorretta Harp MD [name], [place]  HPI:  Leah Velasquez is a 77 y.o.  severely overweight married Caucasian female mother of 4 biologic children and 2 stepchildren, grandmother to 70 grandchildren referred by Dr. Derrek Monaco for cardiovascular evaluation because of A. Fib. 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  I last saw her in the office 12/26/2017.Marland Kitchen  She was hospitalized on 10/21/17 for 4 days because of influenza A, pneumonia/COPD. She did smoke 20 years ago. She's never had a heart attack or stroke. She developed A. Fib during her hospitalization was placed on carvedilol for rate control as well as Eliquis oral anticoagulation for stroke prophylaxis.she does feel somewhat weak and is walking with a walker now getting physical therapy.  I adjusted her losartan because of relative hypotension and her blood pressure seems to be improved.  She remains on Eliquis and carvedilol.  She says that she is feeling better.  Her atrial fibrillation occurred during a hospital station for pneumonia and type a influenza.  She underwent elective outpatient DC cardioversion by Dr. Radford Pax 03/01/2018 and was back in A. fib when she saw Roderic Palau in the A. fib clinic 04/18/2018.  She remains on Eliquis oral anticoagulation. Current Meds  Medication Sig  . albuterol (PROVENTIL HFA;VENTOLIN HFA) 108 (90 Base) MCG/ACT inhaler Inhale 2 puffs into the lungs every 6 (six) hours as needed for wheezing or shortness of breath.  . calcium carbonate (TUMS - DOSED IN MG ELEMENTAL CALCIUM) 500 MG chewable tablet Chew 1 tablet (200 mg of elemental calcium total) by mouth 3 (three) times daily as needed for indigestion or heartburn.  . carboxymethylcellulose (EQ RESTORE TEARS) 0.5 % SOLN Place 1 drop into both eyes daily as needed (for dry eyes).  . Cholecalciferol (VITAMIN D3) 1000 units CAPS Take  1,000 Units by mouth daily.  . colchicine (COLCRYS) 0.6 MG tablet Take 0.6 mg by mouth daily as needed (for Pseudogout).   . fluticasone (FLONASE) 50 MCG/ACT nasal spray Place 1 spray into both nostrils daily.   . furosemide (LASIX) 20 MG tablet Take 20 mg by mouth daily.   . hydrocortisone (ANUSOL-HC) 2.5 % rectal cream Apply rectally 2 times daily  . levocetirizine (XYZAL) 5 MG tablet Take 5 mg by mouth every evening.   Marland Kitchen losartan (COZAAR) 50 MG tablet Take 1 tablet (50 mg total) by mouth daily.  . metoprolol succinate (TOPROL XL) 25 MG 24 hr tablet Take 0.5 tablets (12.5 mg total) by mouth at bedtime.  Marland Kitchen omeprazole (PRILOSEC) 20 MG capsule Take 20 mg by mouth daily.  . simvastatin (ZOCOR) 20 MG tablet Take 20 mg by mouth at bedtime.  . timolol (TIMOPTIC) 0.5 % ophthalmic solution Place 1 drop into both eyes 2 (two) times daily.  . traMADol (ULTRAM) 50 MG tablet Take 50 mg by mouth daily as needed for moderate pain.   Marland Kitchen warfarin (COUMADIN) 5 MG tablet Take 1 tablet (5 mg total) by mouth daily. Transition from Eliquis  . zolpidem (AMBIEN) 5 MG tablet Take 5 mg by mouth at bedtime as needed for sleep.      Allergies  Allergen Reactions  . Morphine Anaphylaxis  . Sulfa Antibiotics Rash    Social History   Socioeconomic History  . Marital status: Married    Spouse name: Not on file  . Number of children: Not on file  .  Years of education: Not on file  . Highest education level: Not on file  Occupational History  . Not on file  Social Needs  . Financial resource strain: Not on file  . Food insecurity:    Worry: Not on file    Inability: Not on file  . Transportation needs:    Medical: Not on file    Non-medical: Not on file  Tobacco Use  . Smoking status: Never Smoker  . Smokeless tobacco: Never Used  Substance and Sexual Activity  . Alcohol use: No    Frequency: Never  . Drug use: No  . Sexual activity: Not on file  Lifestyle  . Physical activity:    Days per week: Not  on file    Minutes per session: Not on file  . Stress: Not on file  Relationships  . Social connections:    Talks on phone: Not on file    Gets together: Not on file    Attends religious service: Not on file    Active member of club or organization: Not on file    Attends meetings of clubs or organizations: Not on file    Relationship status: Not on file  . Intimate partner violence:    Fear of current or ex partner: Not on file    Emotionally abused: Not on file    Physically abused: Not on file    Forced sexual activity: Not on file  Other Topics Concern  . Not on file  Social History Narrative  . Not on file     Review of Systems: General: negative for chills, fever, night sweats or weight changes.  Cardiovascular: negative for chest pain, dyspnea on exertion, edema, orthopnea, palpitations, paroxysmal nocturnal dyspnea or shortness of breath Dermatological: negative for rash Respiratory: negative for cough or wheezing Urologic: negative for hematuria Abdominal: negative for nausea, vomiting, diarrhea, bright red blood per rectum, melena, or hematemesis Neurologic: negative for visual changes, syncope, or dizziness All other systems reviewed and are otherwise negative except as noted above.    Blood pressure 130/76, pulse 73, height 5\' 2"  (1.575 m), weight 264 lb 6.4 oz (119.9 kg), SpO2 96 %.  General appearance: alert and no distress Neck: no adenopathy, no carotid bruit, no JVD, supple, symmetrical, trachea midline and thyroid not enlarged, symmetric, no tenderness/mass/nodules Lungs: clear to auscultation bilaterally Heart: irregularly irregular rhythm Extremities: extremities normal, atraumatic, no cyanosis or edema Pulses: 2+ and symmetric Skin: Skin color, texture, turgor normal. No rashes or lesions Neurologic: Alert and oriented X 3, normal strength and tone. Normal symmetric reflexes. Normal coordination and gait  EKG not performed today  ASSESSMENT AND PLAN:    Atrial fibrillation ([place]) History of PAF currently in atrial fibrillation with controlled ventricular response on Eliquis oral anticoagulation.  She underwent cardioversion by Dr. Radford Pax successfully in July and by the end of August she was back in A. fib.  She is relatively asymptomatic from this.  She wishes to switch from Eliquis to Coumadin anticoagulation because of financial issues which I do not have a problem with.  Her 2D echo reveals normal LV function with left atrial dimensions at the upper limit of normal.  Essential hypertension History of essential hypertension her blood pressure measured today 130/76 on losartan and metoprolol.  Hyperlipidemia History of hyperlipidemia on statin therapy followed by her PCP.      Lorretta Harp MD FACP,FACC,FAHA, [facility] 05/22/2018 2:29 PM

## 2018-06-04 ENCOUNTER — Ambulatory Visit (INDEPENDENT_AMBULATORY_CARE_PROVIDER_SITE_OTHER): Payer: Medicare HMO | Admitting: Pharmacist Clinician (PhC)/ Clinical Pharmacy Specialist

## 2018-06-04 DIAGNOSIS — Z7901 Long term (current) use of anticoagulants: Secondary | ICD-10-CM | POA: Diagnosis not present

## 2018-06-04 DIAGNOSIS — I4819 Other persistent atrial fibrillation: Secondary | ICD-10-CM

## 2018-06-04 LAB — POCT INR: INR: 2.3 (ref 2.0–3.0)

## 2018-06-11 ENCOUNTER — Ambulatory Visit (INDEPENDENT_AMBULATORY_CARE_PROVIDER_SITE_OTHER): Payer: Medicare HMO | Admitting: Pharmacist Clinician (PhC)/ Clinical Pharmacy Specialist

## 2018-06-11 DIAGNOSIS — I4819 Other persistent atrial fibrillation: Secondary | ICD-10-CM | POA: Diagnosis not present

## 2018-06-11 DIAGNOSIS — Z7901 Long term (current) use of anticoagulants: Secondary | ICD-10-CM

## 2018-06-11 LAB — POCT INR: INR: 3.3 — AB (ref 2.0–3.0)

## 2018-06-19 ENCOUNTER — Ambulatory Visit (INDEPENDENT_AMBULATORY_CARE_PROVIDER_SITE_OTHER): Payer: Medicare HMO | Admitting: Pharmacist

## 2018-06-19 DIAGNOSIS — I4819 Other persistent atrial fibrillation: Secondary | ICD-10-CM

## 2018-06-19 DIAGNOSIS — Z7901 Long term (current) use of anticoagulants: Secondary | ICD-10-CM

## 2018-06-19 LAB — POCT INR: INR: 3.8 — AB (ref 2.0–3.0)

## 2018-07-03 ENCOUNTER — Ambulatory Visit (INDEPENDENT_AMBULATORY_CARE_PROVIDER_SITE_OTHER): Payer: Medicare HMO | Admitting: Pharmacist Clinician (PhC)/ Clinical Pharmacy Specialist

## 2018-07-03 DIAGNOSIS — Z7901 Long term (current) use of anticoagulants: Secondary | ICD-10-CM | POA: Diagnosis not present

## 2018-07-03 DIAGNOSIS — I4819 Other persistent atrial fibrillation: Secondary | ICD-10-CM | POA: Diagnosis not present

## 2018-07-03 LAB — POCT INR: INR: 3.5 — AB (ref 2.0–3.0)

## 2018-07-03 NOTE — Patient Instructions (Signed)
Description   HOLD warfarin dose today (11/12) ONLY, then decrease dose to 1/2 tablet daily except 1 tablet each Tuesday and Sunday.  Repeat INR in 2 weeks  Call /Raquel at [phone] with any medication changes

## 2018-07-11 ENCOUNTER — Telehealth: Payer: Self-pay | Admitting: Pharmacist Clinician (PhC)/ Clinical Pharmacy Specialist

## 2018-07-11 NOTE — Telephone Encounter (Signed)
Patient called to report no longer taking prednisone, now on methotrexate weekly and folic acid daily

## 2018-07-17 ENCOUNTER — Other Ambulatory Visit: Payer: Self-pay | Admitting: Pharmacist Clinician (PhC)/ Clinical Pharmacy Specialist

## 2018-07-17 ENCOUNTER — Ambulatory Visit (INDEPENDENT_AMBULATORY_CARE_PROVIDER_SITE_OTHER): Payer: Medicare HMO | Admitting: Pharmacist Clinician (PhC)/ Clinical Pharmacy Specialist

## 2018-07-17 DIAGNOSIS — I4819 Other persistent atrial fibrillation: Secondary | ICD-10-CM | POA: Diagnosis not present

## 2018-07-17 DIAGNOSIS — Z7901 Long term (current) use of anticoagulants: Secondary | ICD-10-CM | POA: Diagnosis not present

## 2018-07-17 LAB — POCT INR: INR: 2 (ref 2.0–3.0)

## 2018-07-17 MED ORDER — WARFARIN SODIUM 5 MG PO TABS: 5.0000 mg | ORAL_TABLET | Freq: Every day | ORAL | 1 refills | Status: DC

## 2018-09-03 ENCOUNTER — Ambulatory Visit (INDEPENDENT_AMBULATORY_CARE_PROVIDER_SITE_OTHER): Payer: Medicare HMO | Admitting: Pharmacist

## 2018-09-03 DIAGNOSIS — Z7901 Long term (current) use of anticoagulants: Secondary | ICD-10-CM | POA: Diagnosis not present

## 2018-09-03 DIAGNOSIS — I4819 Other persistent atrial fibrillation: Secondary | ICD-10-CM | POA: Diagnosis not present

## 2018-09-03 LAB — POCT INR: INR: 1.7 — AB (ref 2.0–3.0)

## 2018-09-05 ENCOUNTER — Other Ambulatory Visit: Payer: Self-pay | Admitting: Family Medicine

## 2018-09-05 DIAGNOSIS — Z1231 Encounter for screening mammogram for malignant neoplasm of breast: Secondary | ICD-10-CM

## 2018-09-25 ENCOUNTER — Encounter: Payer: Self-pay | Admitting: Physician Assistant

## 2018-09-25 ENCOUNTER — Ambulatory Visit (INDEPENDENT_AMBULATORY_CARE_PROVIDER_SITE_OTHER): Payer: Medicare HMO | Admitting: Pharmacist

## 2018-09-25 ENCOUNTER — Ambulatory Visit (INDEPENDENT_AMBULATORY_CARE_PROVIDER_SITE_OTHER): Payer: Medicare HMO | Admitting: Physician Assistant

## 2018-09-25 ENCOUNTER — Other Ambulatory Visit: Payer: Self-pay

## 2018-09-25 VITALS — BP 128/68 | Ht 61.0 in | Wt 278.2 lb

## 2018-09-25 DIAGNOSIS — I4819 Other persistent atrial fibrillation: Secondary | ICD-10-CM | POA: Diagnosis not present

## 2018-09-25 DIAGNOSIS — I5033 Acute on chronic diastolic (congestive) heart failure: Secondary | ICD-10-CM

## 2018-09-25 DIAGNOSIS — Z7901 Long term (current) use of anticoagulants: Secondary | ICD-10-CM

## 2018-09-25 LAB — POCT INR: INR: 1.6 — AB (ref 2.0–3.0)

## 2018-09-25 MED ORDER — POTASSIUM CHLORIDE CRYS ER 20 MEQ PO TBCR: 20.0000 meq | EXTENDED_RELEASE_TABLET | Freq: Every day | ORAL | 2 refills | Status: DC

## 2018-09-25 MED ORDER — FUROSEMIDE 40 MG PO TABS: 40.0000 mg | ORAL_TABLET | Freq: Two times a day (BID) | ORAL | 2 refills | Status: DC

## 2018-09-25 NOTE — Patient Instructions (Signed)
Medication Instructions:  Your physician has recommended you make the following change in your medication:  1) INCREASE Lasix to 40mg  twice daily. If your weight does not go down go to 80mg  daily. 2) START Potassium 58mEq daily. An Rx has been sent to your pharmacy   If you need a refill on your cardiac medications before your next appointment, please call your pharmacy.   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 A paper copy in the mail If you have any lab test that is abnormal or we need to change your treatment, we will call you to review the results.  Testing/Procedures: None ordered  Follow-Up: At [facility], you and your health needs are our priority.  As part of our continuing mission to provide you with exceptional heart care, we have created designated Provider Care Teams.  These Care Teams include your primary Cardiologist (physician) and Advanced Practice Providers (APPs -  Physician Assistants and Nurse Practitioners) who all work together to provide you with the care you need, when you need it. You will need a follow up appointment on 10/02/18 with Rosaria Ferries, PA   Any Other Special Instructions Will Be Listed Below (If Applicable). Your physician recommends that you weigh, daily, at the same time every day, and in the same amount of clothing. Please record your daily weights on the handout provided and bring it to your next appointment.   Limit your sodium intake to 2grams daily  Limit your fluid intake to 1-1.5 liters daily

## 2018-09-25 NOTE — Progress Notes (Signed)
Cardiology Office Note   Date:  09/25/2018   ID:  Leah, Velasquez Feb 17, 1941, MRN [id number]  PCP:  [name], MD Cardiologist:  Quay Burow, MD 05/22/2018 Rosaria Ferries, PA-C   No chief complaint on file.   History of Present Illness: Leah Velasquez is a 78 y.o. female with a history of Afib failed DCCV, failed flecainide, COPD, chronic anticoag w/ Eliquis>>coumadin due to cost issues, HTN, HLD, lung CA  Leah Velasquez presents for cardiology follow up.   She has been on methotrexate x 3 weeks for pseudogout. She was on prednisone, no swelling on the prednisone taper but she gained weight and so the med was changed.  She has LE edema, it is present on waking, but it worsens during the day. It will get better if she elevates her legs, but is there all the time.   She has orthopnea, HOB is elevated and uses 2 pillows. Denies PND, may have nocturia once a night. She has been taking Lasix 40 mg qd, but no change in her fluid status. Does not think she drinks a quart of all liquids daily.    When she first gets up, she can hardly walk, has to hold on to something.    She says that she is very vigilant about the amount of sodium that she takes in.  She also states that she has not over drinking, says that she is only drinking about 3 glasses of water a day and thinks they are 8 ounce glasses.    Past Medical History:  Diagnosis Date  . Hemorrhoids   . Hyperlipidemia   . Hypertension   . 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 Obesity   . Persistent atrial fibrillation     Past Surgical History:  Procedure Laterality Date  . BREAST EXCISIONAL BIOPSY Left    x 2  . BREAST EXCISIONAL BIOPSY Right   . CARDIOVERSION N/A 01/09/2018   Procedure: CARDIOVERSION;  Surgeon: Josue Hector, MD;  Location[facility] ENDOSCOPY;  Service: Cardiovascular;  Laterality: N/A;  . CARDIOVERSION N/A 03/01/2018   Procedure: CARDIOVERSION;  Surgeon: Sueanne Margarita, MD;  Location: [facility] ENDOSCOPY;   Service: Cardiovascular;  Laterality: N/A;  . LUNG LOBECTOMY Right 2000   RLL removed    Current Outpatient Medications  Medication Sig Dispense Refill  . albuterol (PROVENTIL HFA;VENTOLIN HFA) 108 (90 Base) MCG/ACT inhaler Inhale 2 puffs into the lungs every 6 (six) hours as needed for wheezing or shortness of breath. 1 Inhaler 0  . calcium carbonate (TUMS - DOSED IN MG ELEMENTAL CALCIUM) 500 MG chewable tablet Chew 1 tablet (200 mg of elemental calcium total) by mouth 3 (three) times daily as needed for indigestion or heartburn.    . carboxymethylcellulose (EQ RESTORE TEARS) 0.5 % SOLN Place 1 drop into both eyes daily as needed (for dry eyes).    . Cholecalciferol (VITAMIN D3) 1000 units CAPS Take 1,000 Units by mouth daily.    . fluticasone (FLONASE) 50 MCG/ACT nasal spray Place 1 spray into both nostrils daily.     . folic acid (FOLVITE) 1 MG tablet Take 1 mg by mouth daily.    . furosemide (LASIX) 20 MG tablet Take 20 mg by mouth daily.     . hydrocortisone (ANUSOL-HC) 2.5 % rectal cream Apply rectally 2 times daily 28.35 g 1  . levocetirizine (XYZAL) 5 MG tablet Take 5 mg by mouth every evening.     Marland Kitchen losartan (  COZAAR) 50 MG tablet Take 1 tablet (50 mg total) by mouth daily. 30 tablet 6  . methotrexate (RHEUMATREX) 2.5 MG tablet Take 10 mg by mouth once a week. Caution:Chemotherapy. Protect from light.    . metoprolol succinate (TOPROL XL) 25 MG 24 hr tablet Take 0.5 tablets (12.5 mg total) by mouth at bedtime. 45 tablet 3  . omeprazole (PRILOSEC) 20 MG capsule Take 20 mg by mouth daily.    . simvastatin (ZOCOR) 20 MG tablet Take 20 mg by mouth at bedtime.    . timolol (TIMOPTIC) 0.5 % ophthalmic solution Place 1 drop into both eyes 2 (two) times daily.    . traMADol (ULTRAM) 50 MG tablet Take 50 mg by mouth daily as needed for moderate pain.   0  . warfarin (COUMADIN) 5 MG tablet Take 1 tablet (5 mg total) by mouth daily. Transition from Eliquis 90 tablet 1  . zolpidem (AMBIEN) 5 MG  tablet Take 5 mg by mouth at bedtime as needed for sleep.      No current facility-administered medications for this visit.     Allergies:   Morphine and Sulfa antibiotics    Social History:  The patient  reports that she has never smoked. She has never used smokeless tobacco. She reports that she does not drink alcohol or use drugs.   Family History:  The patient's family history is not on file.  has no family status information on file.    ROS:  Please see the history of present illness. All other systems are reviewed and negative.    PHYSICAL EXAM: VS:  BP 128/68   Ht 5\' 1"  (1.549 m)   Wt 278 lb 3.2 oz (126.2 kg)   BMI 52.57 kg/m  , BMI Body mass index is 52.57 kg/m. GEN: Well nourished, well developed, obese female in no acute distress HEENT: normal for age  Neck: no JVD, no carotid bruit, no masses Cardiac: Irregular R&R; soft murmur, no rubs, or gallops Respiratory: decreased BS bases R>L, normal work of breathing GI: soft, nontender, nondistended, + BS MS: no deformity or atrophy; 2+ LE edema; distal pulses are 2+ in upper extremities, difficult to palpate in lower extremities due to edema Skin: warm and dry, no rash Neuro:  Strength and sensation are intact Psych: euthymic mood, full affect   EKG:  EKG is ordered today. The ekg ordered today demonstrates Atrial fib, HR 80, decreased amplitude precordial leads is chronic  ECHO: 10/22/2017 - Left ventricle: The cavity size was normal. There was mild   concentric hypertrophy. Systolic function was normal. The   estimated ejection fraction was in the range of 60% to 65%. Wall   motion was normal; there were no regional wall motion   abnormalities. - Aortic valve: There was no regurgitation. - Mitral valve: There was trivial regurgitation. - Right ventricle: Systolic function was normal. - Right atrium: The atrium was normal in size. - Tricuspid valve: There was no regurgitation. - Pulmonary arteries: Systolic  pressure could not be accurately   estimated. - Inferior vena cava: The vessel was normal in size. - Pericardium, extracardiac: There was no pericardial effusion.   Recent Labs: 10/21/2017: B Natriuretic Peptide 89.4 10/22/2017: TSH 0.629 03/05/2018: ALT 68; Hemoglobin 14.0; Platelets 218 04/18/2018: BUN 20; Creatinine, Ser 0.90; Magnesium 2.2; Potassium 3.8; Sodium 140  CBC    Component Value Date/Time   WBC 6.4 03/05/2018 1952   RBC 4.32 03/05/2018 1952   HGB 14.0 03/05/2018 1952   HGB  13.7 12/26/2017 1145   HCT 42.2 03/05/2018 1952   HCT 40.0 12/26/2017 1145   PLT 218 03/05/2018 1952   PLT 204 12/26/2017 1145   MCV 97.7 03/05/2018 1952   MCV 95 12/26/2017 1145   MCH 32.4 03/05/2018 1952   MCHC 33.2 03/05/2018 1952   RDW 12.7 03/05/2018 1952   RDW 14.1 12/26/2017 1145   LYMPHSABS 1.4 12/26/2017 1145   MONOABS 0.8 10/23/2017 0621   EOSABS 0.2 12/26/2017 1145   BASOSABS 0.0 12/26/2017 1145   CMP Latest Ref Rng & Units 04/18/2018 03/05/2018 02/19/2018  Glucose 70 - 99 mg/dL 103(H) 130(H) 119(H)  BUN 8 - 23 mg/dL 20 21 18   Creatinine 0.44 - 1.00 mg/dL 0.90 0.83 0.89  Sodium 135 - 145 mmol/L 140 144 139  Potassium 3.5 - 5.1 mmol/L 3.8 4.3 4.4  Chloride 98 - 111 mmol/L 100 105 99  CO2 22 - 32 mmol/L 32 27 31  Calcium 8.9 - 10.3 mg/dL 9.7 9.8 9.9  Total Protein 6.5 - 8.1 g/dL - 7.3 -  Total Bilirubin 0.3 - 1.2 mg/dL - 0.4 -  Alkaline Phos 38 - 126 U/L - 133(H) -  AST 15 - 41 U/L - 47(H) -  ALT 0 - 44 U/L - 68(H) -     Lipid Panel No results found for: CHOL, HDL, LDLCALC, LDLDIRECT, TRIG, CHOLHDL    Wt Readings from Last 3 Encounters:  09/25/18 278 lb 3.2 oz (126.2 kg)  05/22/18 264 lb 6.4 oz (119.9 kg)  04/18/18 261 lb (118.4 kg)     Other studies Reviewed: Additional studies/ records that were reviewed today include: office notes, hospital records and testing.   ASSESSMENT AND PLAN:  1.  Acute on chronic diastolic CHF:  Her weight is up about 15 lbs in 4 months. She  denies dietary indiscretion, pays attention to the amounts of sodium in foods and does not think she overdrinks. Says glasses are 8 oz each and only drinks 3 a day. Is worried about dehydration. - Increase Lasix temporarily, try 40 mg twice daily but if that does not make a difference, go to 80 mg once a day.  Her last potassium was 3.8, add 20 mEq daily. -Emphasized the importance of daily weights and compliance with a low-sodium diet.  Explained that she needs to drink at least a quart of water a day and up to a quart and a half, but no more. - BMET today  2.  Persistent atrial fibrillation: Her heart rate is generally controlled.  I have no reason to think that her atrial fibrillation is contributing to the shortness of breath.  3.  Chronic anticoagulation: Her Coumadin level was slightly subtherapeutic today, as it was on January 13.  However, with the weight gain and edema, I do not think PE is a significant player here.   Current medicines are reviewed at length with the patient today.  The patient has concerns regarding medicines. Concerns were addressed  The following changes have been made:  Increase Lasix temporarily, add K+  Labs/ tests ordered today include:   Orders Placed This Encounter  Procedures  . Basic metabolic panel  . EKG 12-Lead     Disposition:   FU with Quay Burow, MD as scheduled and with me on 02/11  SignedRosaria Ferries, PA-C  09/25/2018 5:23 PM    [place] Phone: [phone]; Fax: [phone]

## 2018-09-26 LAB — BASIC METABOLIC PANEL
BUN/Creatinine Ratio: 13 (ref 12–28)
BUN: 12 mg/dL (ref 8–27)
CALCIUM: 9.5 mg/dL (ref 8.7–10.3)
CHLORIDE: 97 mmol/L (ref 96–106)
CO2: 26 mmol/L (ref 20–29)
Creatinine, Ser: 0.91 mg/dL (ref 0.57–1.00)
GFR calc Af Amer: 71 mL/min/{1.73_m2} (ref >59)
GFR calc non Af Amer: 62 mL/min/{1.73_m2} (ref >59)
Glucose: 153 mg/dL — ABNORMAL HIGH (ref 65–99)
POTASSIUM: 4 mmol/L (ref 3.5–5.2)
SODIUM: 141 mmol/L (ref 134–144)

## 2018-09-28 ENCOUNTER — Telehealth: Payer: Self-pay | Admitting: Cardiovascular Disease

## 2018-09-28 NOTE — Telephone Encounter (Signed)
New message   Pt c/o medication issue:  1. Name of Medication: potassium chloride SA (K-DUR,KLOR-CON) 20 MEQ tablet  2. How are you currently taking this medication (dosage and times per day)?  1 time daily.  3. Are you having a reaction (difficulty breathing--STAT)? No   4. What is your medication issue? Patient states that she can't swallow this medication in pill form.

## 2018-09-28 NOTE — Telephone Encounter (Signed)
Spoke with pt and encouraged pt to try Potassium in applesauce and if not easier to swallow to call back.Pt agrees and will give  it a try ./cy

## 2018-10-02 ENCOUNTER — Encounter: Payer: Self-pay | Admitting: Physician Assistant

## 2018-10-02 ENCOUNTER — Ambulatory Visit: Payer: Medicare HMO | Admitting: Physician Assistant

## 2018-10-02 VITALS — BP 118/62 | HR 86 | Ht 61.5 in | Wt 272.4 lb

## 2018-10-02 DIAGNOSIS — I10 Essential (primary) hypertension: Secondary | ICD-10-CM

## 2018-10-02 DIAGNOSIS — I5032 Chronic diastolic (congestive) heart failure: Secondary | ICD-10-CM

## 2018-10-02 DIAGNOSIS — I482 Chronic atrial fibrillation, unspecified: Secondary | ICD-10-CM

## 2018-10-02 MED ORDER — POTASSIUM CHLORIDE CRYS ER 20 MEQ PO TBCR: EXTENDED_RELEASE_TABLET | ORAL | 1 refills | Status: DC

## 2018-10-02 MED ORDER — FUROSEMIDE 40 MG PO TABS: 40.0000 mg | ORAL_TABLET | Freq: Every day | ORAL | 2 refills | Status: DC

## 2018-10-02 NOTE — Progress Notes (Signed)
Cardiology Office Note   Date:  10/02/2018   ID:  Ilamae, Geng 1941/04/27, MRN [id number]  PCP:  [name], MD Cardiologist:  Quay Burow, MD 05/22/2018 Rosaria Ferries, PA-C 09/25/2018  No chief complaint on file.   History of Present Illness: Leah Velasquez is a 78 y.o. female with a history of Afib failed DCCV, failed flecainide, COPD, chronic anticoag w/ Eliquis>>coumadin due to cost issues, HTN, HLD, lung CA, D-CHF  Office visit 02/04, pt w/ acute on chronic CHF, Lasix increased temporarily, Kdur added (problems swallowing it>>applesauce)  Leah Velasquez presents for cardiology follow up.   One sister is in ICU and one had TKR, feeling family stress.  She feels much better, has lost 6 lbs.   Is watching the sodium, will pay attention to the liquids.  She was able to take the [place], was able to break it up with a nutcracker.   She is getting more rest at night and feels that she can increase her activity.  She feels that her current weight is her dry weight, will seek to maintain this.   Past Medical History:  Diagnosis Date  . Hemorrhoids   . Hyperlipidemia   . Hypertension   . Lung cancer ([place]) 2000   RLL  . Obesity   . Persistent atrial fibrillation     Past Surgical History:  Procedure Laterality Date  . BREAST EXCISIONAL BIOPSY Left    x 2  . BREAST EXCISIONAL BIOPSY Right   . CARDIOVERSION N/A 01/09/2018   Procedure: CARDIOVERSION;  Surgeon: Josue Hector, MD;  Location[facility] ENDOSCOPY;  Service: Cardiovascular;  Laterality: N/A;  . CARDIOVERSION N/A 03/01/2018   Procedure: CARDIOVERSION;  Surgeon: Sueanne Margarita, MD;  Location: [facility] ENDOSCOPY;  Service: Cardiovascular;  Laterality: N/A;  . LUNG LOBECTOMY Right 2000   RLL removed    Current Outpatient Medications  Medication Sig Dispense Refill  . albuterol (PROVENTIL HFA;VENTOLIN HFA) 108 (90 Base) MCG/ACT inhaler Inhale 2 puffs into the lungs every 6 (six) hours as needed for wheezing  or shortness of breath. 1 Inhaler 0  . calcium carbonate (TUMS - DOSED IN MG ELEMENTAL CALCIUM) 500 MG chewable tablet Chew 1 tablet (200 mg of elemental calcium total) by mouth 3 (three) times daily as needed for indigestion or heartburn.    . carboxymethylcellulose (EQ RESTORE TEARS) 0.5 % SOLN Place 1 drop into both eyes daily as needed (for dry eyes).    . Cholecalciferol (VITAMIN D3) 1000 units CAPS Take 1,000 Units by mouth daily.    . fluticasone (FLONASE) 50 MCG/ACT nasal spray Place 1 spray into both nostrils daily.     . folic acid (FOLVITE) 1 MG tablet Take 1 mg by mouth daily.    . furosemide (LASIX) 40 MG tablet Take 1 tablet (40 mg total) by mouth 2 (two) times daily. 60 tablet 2  . hydrocortisone (ANUSOL-HC) 2.5 % rectal cream Apply rectally 2 times daily 28.35 g 1  . levocetirizine (XYZAL) 5 MG tablet Take 5 mg by mouth every evening.     Marland Kitchen losartan (COZAAR) 50 MG tablet Take 1 tablet (50 mg total) by mouth daily. 30 tablet 6  . methotrexate (RHEUMATREX) 2.5 MG tablet Take 10 mg by mouth once a week. Caution:Chemotherapy. Protect from light.    . metoprolol succinate (TOPROL XL) 25 MG 24 hr tablet Take 0.5 tablets (12.5 mg total) by mouth at bedtime. 45 tablet 3  . omeprazole (PRILOSEC) 20 MG  capsule Take 20 mg by mouth daily.    . potassium chloride SA (K-DUR,KLOR-CON) 20 MEQ tablet Take 1 tablet (20 mEq total) by mouth daily. 30 tablet 2  . simvastatin (ZOCOR) 20 MG tablet Take 20 mg by mouth at bedtime.    . timolol (TIMOPTIC) 0.5 % ophthalmic solution Place 1 drop into both eyes 2 (two) times daily.    . traMADol (ULTRAM) 50 MG tablet Take 50 mg by mouth daily as needed for moderate pain.   0  . warfarin (COUMADIN) 5 MG tablet Take 1 tablet (5 mg total) by mouth daily. Transition from Eliquis 90 tablet 1  . zolpidem (AMBIEN) 5 MG tablet Take 5 mg by mouth at bedtime as needed for sleep.      No current facility-administered medications for this visit.     Allergies:    Morphine and Sulfa antibiotics    Social History:  The patient  reports that she has never smoked. She has never used smokeless tobacco. She reports that she does not drink alcohol or use drugs.   Family History:  The patient's family history is not on file.  has no family status information on file.   ROS:  Please see the history of present illness. All other systems are reviewed and negative.    PHYSICAL EXAM: VS:  BP 118/62   Pulse 86   Ht 5' 1.5" (1.562 m)   Wt 272 lb 6.4 oz (123.6 kg)   SpO2 94%   BMI 50.64 kg/m  , BMI Body mass index is 50.64 kg/m. GEN: Well nourished, well developed, female in no acute distress HEENT: normal for age  Neck: no JVD, no carotid bruit, no masses Cardiac: Regular rate and rhythm, soft murmur, no rubs, or gallops Respiratory:  clear to auscultation bilaterally, normal work of breathing GI: soft, nontender, nondistended, + BS MS: no deformity or atrophy; no edema; distal pulses are 2+ in all 4 extremities  Skin: warm and dry, no rash Neuro:  Strength and sensation are intact Psych: euthymic mood, full affect   EKG:  EKG is not ordered today.  ECHO: 10/22/2017 - Left ventricle: The cavity size was normal. There was mild concentric hypertrophy. Systolic function was normal. The estimated ejection fraction was in the range of 60% to 65%. Wall motion was normal; there were no regional wall motion abnormalities. - Aortic valve: There was no regurgitation. - Mitral valve: There was trivial regurgitation. - Right ventricle: Systolic function was normal. - Right atrium: The atrium was normal in size. - Tricuspid valve: There was no regurgitation. - Pulmonary arteries: Systolic pressure could not be accurately estimated. - Inferior vena cava: The vessel was normal in size. - Pericardium, extracardiac: There was no pericardial effusion.   Recent Labs: 10/21/2017: B Natriuretic Peptide 89.4 10/22/2017: TSH 0.629 03/05/2018: ALT 68;  Hemoglobin 14.0; Platelets 218 04/18/2018: Magnesium 2.2 09/25/2018: BUN 12; Creatinine, Ser 0.91; Potassium 4.0; Sodium 141  CBC    Component Value Date/Time   WBC 6.4 03/05/2018 1952   RBC 4.32 03/05/2018 1952   HGB 14.0 03/05/2018 1952   HGB 13.7 12/26/2017 1145   HCT 42.2 03/05/2018 1952   HCT 40.0 12/26/2017 1145   PLT 218 03/05/2018 1952   PLT 204 12/26/2017 1145   MCV 97.7 03/05/2018 1952   MCV 95 12/26/2017 1145   MCH 32.4 03/05/2018 1952   MCHC 33.2 03/05/2018 1952   RDW 12.7 03/05/2018 1952   RDW 14.1 12/26/2017 1145   LYMPHSABS 1.4 12/26/2017  1145   MONOABS 0.8 10/23/2017 0621   EOSABS 0.2 12/26/2017 1145   BASOSABS 0.0 12/26/2017 1145   CMP Latest Ref Rng & Units 09/25/2018 04/18/2018 03/05/2018  Glucose 65 - 99 mg/dL 153(H) 103(H) 130(H)  BUN 8 - 27 mg/dL 12 20 21   Creatinine 0.57 - 1.00 mg/dL 0.91 0.90 0.83  Sodium 134 - 144 mmol/L 141 140 144  Potassium 3.5 - 5.2 mmol/L 4.0 3.8 4.3  Chloride 96 - 106 mmol/L 97 100 105  CO2 20 - 29 mmol/L 26 32 27  Calcium 8.7 - 10.3 mg/dL 9.5 9.7 9.8  Total Protein 6.5 - 8.1 g/dL - - 7.3  Total Bilirubin 0.3 - 1.2 mg/dL - - 0.4  Alkaline Phos 38 - 126 U/L - - 133(H)  AST 15 - 41 U/L - - 47(H)  ALT 0 - 44 U/L - - 68(H)     Lipid Panel No results found for: CHOL, HDL, LDLCALC, LDLDIRECT, TRIG, CHOLHDL    Wt Readings from Last 3 Encounters:  10/02/18 272 lb 6.4 oz (123.6 kg)  09/25/18 278 lb 3.2 oz (126.2 kg)  05/22/18 264 lb 6.4 oz (119.9 kg)     Other studies Reviewed: Additional studies/ records that were reviewed today include: Office notes, hospital records and testing.  ASSESSMENT AND PLAN:  1.  Chronic diastolic CHF: -Discussion with the patient regarding why she has heart failure even though her heart is not weak -Importance of compliance with sodium and fluid restrictions was reinforced, continue daily weights - Discussed using as needed extra Lasix tablets to maintain her weight.  I explained that I did not  want her to take Lasix 80 mg every day.  If she can take an extra Lasix tablet once or twice a week to maintain her dry weight, that is preferable.  She understands. She will take the potassium only on the days that she takes the extra Lasix tablet - Check a BMET today  2.  Hypertension: Her blood pressure is well controlled on current therapies.  3.  Chronic atrial fibrillation: She wonders if the atrial fibrillation is contributing to her heart failure.  I explained that I could work on the heart failure but I could not fix the atrial fibrillation is the decision had already been made for her to pursue rate control, not rhythm control.  She understands. -Heart rate is generally controlled on low-dose Toprol.  Continue beta-blocker and anticoagulation   Current medicines are reviewed at length with the patient today.  The patient has concerns regarding medicines.  Concerns were addressed  The following changes have been made: Use additional Lasix tablets 40 mg as needed for weight gain, take potassium only when she takes the additional Lasix tablet  Labs/ tests ordered today include:  No orders of the defined types were placed in this encounter.   Disposition:   FU with Quay Burow, MD  Signed, Rosaria Ferries, PA-C  10/02/2018 2:48 PM    [place] Phone: [phone]; Fax: [phone]

## 2018-10-02 NOTE — Patient Instructions (Signed)
Medication Instructions:  Take Lasix 40 mg daily. May take an extra tablet daily as needed for swelling.  Take Potassium 20 mEq ONLY with the extra dose of Lasix, if taken.  If you need a refill on your cardiac medications before your next appointment, please call your pharmacy.   Lab work: Your physician recommends that you return for lab work today: BMET  If you have labs (blood work) drawn today and your tests are completely normal, you will receive your results only by: Marland Kitchen MyChart Message (if you have MyChart) OR . A paper copy in the mail If you have any lab test that is abnormal or we need to change your treatment, we will call you to review the results.  Follow-Up: At [facility], you and your health needs are our priority.  As part of our continuing mission to provide you with exceptional heart care, we have created designated Provider Care Teams.  These Care Teams include your primary Cardiologist (physician) and Advanced Practice Providers (APPs -  Physician Assistants and Nurse Practitioners) who all work together to provide you with the care you need, when you need it. You will need a follow up appointment in 3 months. You may see Quay Burow, MD or one of the following Advanced Practice Providers on your designated Care Team:   Kerin Ransom, PA-C Roby Lofts, [place] . Sande Rives, PA-C  Any Other Special Instructions Will Be Listed Below (If Applicable). Daily Weight Record: It is important to weigh yourself daily. Keep yoru daily weight chart near your scale. Weigh yourself each morning at the same time. Weigh yourself without shoes, and try to wear the same amount of clothing each day. Compare today's weight to yesterday's weight.  Bring this form with you to your follow-up appointments.  CONTINUE DAILY WEIGHTS-YOU MAY TAKE ADDITIONAL LASIX AND POTASSIUM, IF YOUR WEIGHT IS >3 POUNDS DAILY OR >5 POUNDS IN ONE WEEK. PLEASE CALL OUR OFFICE TO LET us KNOW.  Continue  limiting sodium intake to 2,000 mg daily and liquids to 1.5 Liters/quarts of liquid daily.  Please call if you are having any problems. [phone]  [place]

## 2018-10-03 ENCOUNTER — Ambulatory Visit: Payer: Medicare HMO

## 2018-10-03 LAB — BASIC METABOLIC PANEL
BUN/Creatinine Ratio: 15 (ref 12–28)
BUN: 14 mg/dL (ref 8–27)
CALCIUM: 9.9 mg/dL (ref 8.7–10.3)
CO2: 26 mmol/L (ref 20–29)
Chloride: 94 mmol/L — ABNORMAL LOW (ref 96–106)
Creatinine, Ser: 0.96 mg/dL (ref 0.57–1.00)
GFR calc Af Amer: 67 mL/min/{1.73_m2} (ref >59)
GFR calc non Af Amer: 58 mL/min/{1.73_m2} — ABNORMAL LOW (ref >59)
Glucose: 141 mg/dL — ABNORMAL HIGH (ref 65–99)
POTASSIUM: 4.6 mmol/L (ref 3.5–5.2)
Sodium: 138 mmol/L (ref 134–144)

## 2018-10-05 ENCOUNTER — Telehealth: Payer: Self-pay | Admitting: Physician Assistant

## 2018-10-05 NOTE — Telephone Encounter (Signed)
Result Notes for Basic metabolic panel   Notes recorded by Therisa Doyne on 10/05/2018 at 10:16 AM EST Results given to pt. Pt verbalized understanding. Pt stated her weight is up 3 lbs this morning and she is going to take the extra furosemide dose. Told pt I would let Suanne Marker know, and to please call if she notices more weight gain/swelling. Pt verbalized thanks for the call. ------  Notes recorded by Barrett, Evelene Croon, PA-C on 10/04/2018 at 6:07 PM EST Please let her know that her BMET was fine. Her potassium level is good, based on these results, I feel she is at her dry weight. Continue current medications and follow-up as scheduled. Thanks

## 2018-10-15 ENCOUNTER — Telehealth: Payer: Self-pay | Admitting: Cardiovascular Disease

## 2018-10-15 DIAGNOSIS — Z7901 Long term (current) use of anticoagulants: Secondary | ICD-10-CM

## 2018-10-15 DIAGNOSIS — I482 Chronic atrial fibrillation, unspecified: Secondary | ICD-10-CM

## 2018-10-15 NOTE — Telephone Encounter (Signed)
Lab ordered in Epic.  Patient going to [facility] in [place] this afternoon.  She knows to call if lab not visible in their system

## 2018-10-15 NOTE — Telephone Encounter (Signed)
Pt is asking to have her INR drawn today at [facility] with other labs she is having for another MD so she does not have to come in tomorrow to see the Pharmacist... she is going later this afternoon... advised her that I will forward to our Coumadin Clinic to be sure that is okay and will call her back. Pt agreed.

## 2018-10-15 NOTE — Telephone Encounter (Signed)
New Message   Patient states she's going to [facility] to have labs done for another doctor and wants to know if a script could be faxed over from here to do those labs also.

## 2018-10-17 ENCOUNTER — Ambulatory Visit (INDEPENDENT_AMBULATORY_CARE_PROVIDER_SITE_OTHER): Payer: Medicare HMO | Admitting: Pharmacist Clinician (PhC)/ Clinical Pharmacy Specialist

## 2018-10-17 DIAGNOSIS — Z7901 Long term (current) use of anticoagulants: Secondary | ICD-10-CM | POA: Diagnosis not present

## 2018-10-17 DIAGNOSIS — I482 Chronic atrial fibrillation, unspecified: Secondary | ICD-10-CM

## 2018-10-17 LAB — PROTIME-INR
INR: 1.8 — ABNORMAL HIGH (ref 0.8–1.2)
Prothrombin Time: 18.2 s — ABNORMAL HIGH (ref 9.1–12.0)

## 2018-11-28 ENCOUNTER — Telehealth: Payer: Self-pay

## 2018-11-28 NOTE — Telephone Encounter (Signed)

## 2018-12-17 ENCOUNTER — Other Ambulatory Visit: Payer: Self-pay | Admitting: Physician Assistant

## 2018-12-28 ENCOUNTER — Telehealth: Payer: Self-pay | Admitting: Cardiovascular Disease

## 2018-12-28 NOTE — Telephone Encounter (Signed)
Left message with husband for her to call regarding changing appt to virtual visit

## 2018-12-28 NOTE — Telephone Encounter (Signed)
F/U Message            Patient is calling to verify that's it's ok to do a virtual call only [phone]

## 2019-01-01 ENCOUNTER — Ambulatory Visit: Payer: Medicare HMO | Admitting: Cardiovascular Disease

## 2019-01-01 ENCOUNTER — Telehealth (INDEPENDENT_AMBULATORY_CARE_PROVIDER_SITE_OTHER): Payer: Medicare HMO | Admitting: Cardiovascular Disease

## 2019-01-01 ENCOUNTER — Telehealth: Payer: Self-pay

## 2019-01-01 ENCOUNTER — Encounter: Payer: Self-pay | Admitting: Cardiovascular Disease

## 2019-01-01 DIAGNOSIS — I4821 Permanent atrial fibrillation: Secondary | ICD-10-CM

## 2019-01-01 DIAGNOSIS — Z7901 Long term (current) use of anticoagulants: Secondary | ICD-10-CM | POA: Diagnosis not present

## 2019-01-01 DIAGNOSIS — I10 Essential (primary) hypertension: Secondary | ICD-10-CM | POA: Diagnosis not present

## 2019-01-01 DIAGNOSIS — E782 Mixed hyperlipidemia: Secondary | ICD-10-CM

## 2019-01-01 MED ORDER — LOSARTAN POTASSIUM 50 MG PO TABS: 50.0000 mg | ORAL_TABLET | Freq: Every day | ORAL | 6 refills | Status: DC

## 2019-01-01 MED ORDER — METOPROLOL SUCCINATE ER 25 MG PO TB24: 12.5000 mg | ORAL_TABLET | Freq: Every day | ORAL | 3 refills | Status: DC

## 2019-01-01 NOTE — Telephone Encounter (Signed)
Patient and/or DPR-approved person Clair Gulling [facility]) aware of AVS instructions and verbalized understanding.  Letter including After Visit Summary and any other necessary documents to be mailed to the patient's address on file.

## 2019-01-01 NOTE — Progress Notes (Signed)
Virtual Visit via Telephone Note   This visit type was conducted due to national recommendations for restrictions regarding the COVID-19 Pandemic (e.g. social distancing) in an effort to limit this patient's exposure and mitigate transmission in our community.  Due to her co-morbid illnesses, this patient is at least at moderate risk for complications without adequate follow up.  This format is felt to be most appropriate for this patient at this time.  The patient did not have access to video technology/had technical difficulties with video requiring transitioning to audio format only (telephone).  All issues noted in this document were discussed and addressed.  No physical exam could be performed with this format.  Please refer to the patient's chart for her  consent to telehealth for [facility].   Date:  01/01/2019   ID:  Leah Velasquez, DOB [birth year], MRN [id number]  Patient Location: Home Provider Location: Home  PCP:  Leah Mires, MD  Cardiologist:  Leah Burow, MD  Electrophysiologist:  None   Evaluation Performed:  Follow-Up Visit  Chief Complaint: Atrial fibrillation  History of Present Illness:    Leah Velasquez is a 77 y.o.  severely overweight married Caucasian female mother of 4 biologic children and 2 stepchildren, grandmother to 47 grandchildren referred by Dr. Derrek Velasquez for cardiovascular evaluation because of A. Fib. 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I last saw her in the office  05/22/2018... She was hospitalized on 10/21/17 for 4 days because of influenza A, pneumonia/COPD. She did smoke 20 years ago. She's never had a heart attack or stroke. She developed A. Fib during her hospitalization was placed on carvedilol for rate control as well as Eliquis oral anticoagulation for stroke prophylaxis.she does feel somewhat weak and is walking with a walker now getting physical therapy.I adjusted her losartan because of relative hypotension and her blood  pressure seems to be improved. She remains on Eliquis and carvedilol. She says that she is feeling better. Her atrial fibrillation occurred during a hospital station for pneumonia and type a influenza.  She underwent elective outpatient DC cardioversion by Dr. Radford Velasquez 03/01/2018 and was back in A. fib when she saw Leah Velasquez in the A. fib clinic 04/18/2018.  She had to switch from Eliquis to warfarin because of financial constraints.  Since I saw her back 6 months ago she is remained stable.  She remains in atrial fibrillation rate controlled.  We decided to settle for rate as opposed to rhythm control.  She is relatively asymptomatic.  She denies chest pain or shortness of breath.  She has lost 40 pounds in the last 6 months from diuretics, salt restriction and diet.  ActiveMedications     The patient does not have symptoms concerning for COVID-19 infection (fever, chills, cough, or new shortness of breath).    Past Medical History:  Diagnosis Date  . Hemorrhoids   . Hyperlipidemia   . Hypertension   . Lung cancer ([place]) 2000   RLL  . Obesity   . Persistent atrial fibrillation    Past Surgical History:  Procedure Laterality Date  . BREAST EXCISIONAL BIOPSY Left    x 2  . BREAST EXCISIONAL BIOPSY Right   . CARDIOVERSION N/A 01/09/2018   Procedure: CARDIOVERSION;  Surgeon: Leah Hector, MD;  Location: [facility] ENDOSCOPY;  Service: Cardiovascular;  Laterality: N/A;  . CARDIOVERSION N/A 03/01/2018   Procedure: CARDIOVERSION;  Surgeon: Leah Margarita, MD;  Location: [place];  Service: Cardiovascular;  Laterality: N/A;  .  LUNG LOBECTOMY Right 2000   RLL removed     No outpatient medications have been marked as taking for the 01/01/19 encounter (Telemedicine) with Leah Harp, MD.     Allergies:   Morphine and Sulfa antibiotics   Social History   Tobacco Use  . Smoking status: Never Smoker  . Smokeless tobacco: Never Used  Substance Use Topics  . Alcohol use: No     Frequency: Never  . Drug use: No     Family Hx: The patient's family history is not on file.  ROS:   Please see the history of present illness.     All other systems reviewed and are negative.   Prior CV studies:   The following studies were reviewed today:  None  Labs/Other Tests and Data Reviewed:    EKG:  An ECG dated 09/25/2018 was personally reviewed today and demonstrated:  Atrial fibrillation with a ventricular spots of 80  Recent Labs: 03/05/2018: ALT 68; Hemoglobin 14.0; Platelets 218 04/18/2018: Magnesium 2.2 10/02/2018: BUN 14; Creatinine, Ser 0.96; Potassium 4.6; Sodium 138   Recent Lipid Panel No results found for: CHOL, TRIG, HDL, CHOLHDL, LDLCALC, LDLDIRECT  Wt Readings from Last 3 Encounters:  01/01/19 225 lb 3.2 oz (102.2 kg)  10/02/18 272 lb 6.4 oz (123.6 kg)  09/25/18 278 lb 3.2 oz (126.2 kg)     Objective:    Vital Signs:  BP 135/70   Pulse 77   Ht 5\' 1"  (1.549 m)   Wt 225 lb 3.2 oz (102.2 kg)   BMI 42.55 kg/m    VITAL SIGNS:  reviewed blood pressure check by the patient today was 135/70.  A full physical exam was not performed since this was a virtual telemedicine phone visit  ASSESSMENT & PLAN:    1. Persistent atrial fibrillation- status post DC cardioversion last July by Dr. Radford Velasquez having reverted back to A. fib when she saw Leah Velasquez in the A. fib clinic in August.  She remains in A. fib rate controlled on Coumadin anticoagulation. 2. Hyperlipidemia-on simvastatin followed by her PCP 3. Essential hypertension- on metoprolol and losartan with blood pressure measured by the patient today at home at 135/70.  COVID-19 Education: The signs and symptoms of COVID-19 were discussed with the patient and how to seek care for testing (follow up with PCP or arrange E-visit).  The importance of social distancing was discussed today.  Time:   Today, I have spent 7 minutes with the patient with telehealth technology discussing the above problems.      Medication Adjustments/Labs and Tests Ordered: Current medicines are reviewed at length with the patient today.  Concerns regarding medicines are outlined above.   Tests Ordered: No orders of the defined types were placed in this encounter.   Medication Changes: No orders of the defined types were placed in this encounter.   Disposition:  Follow up in 6 month(s)  Signed, Leah Burow, MD  01/01/2019 12:40 PM    [place]

## 2019-01-01 NOTE — Patient Instructions (Signed)
Medication Instructions:  Your physician recommends that you continue on your current medications as directed. Please refer to the Current Medication list given to you today.  If you need a refill on your cardiac medications before your next appointment, please call your pharmacy.   Lab work: NONE If you have labs (blood work) drawn today and your tests are completely normal, you will receive your results only by: Marland Kitchen MyChart Message (if you have MyChart) OR . A paper copy in the mail If you have any lab test that is abnormal or we need to change your treatment, we will call you to review the results.  Testing/Procedures: NONE  Follow-Up: At [facility], you and your health needs are our priority.  As part of our continuing mission to provide you with exceptional heart care, we have created designated Provider Care Teams.  These Care Teams include your primary Cardiologist (physician) and Advanced Practice Providers (APPs -  Physician Assistants and Nurse Practitioners) who all work together to provide you with the care you need, when you need it. You will need a follow up appointment in 6 months with Rosaria Ferries, PA-C and in 12 months with Dr. Gwenlyn Found.  Please call our office 2 months in advance to schedule each appointment.

## 2019-01-03 NOTE — Telephone Encounter (Signed)
Pt had successful telemedicine visit on 5/12

## 2019-01-09 ENCOUNTER — Telehealth: Payer: Self-pay

## 2019-01-09 NOTE — Telephone Encounter (Signed)

## 2019-01-10 ENCOUNTER — Ambulatory Visit (INDEPENDENT_AMBULATORY_CARE_PROVIDER_SITE_OTHER): Payer: Medicare HMO | Admitting: *Deleted

## 2019-01-10 ENCOUNTER — Other Ambulatory Visit: Payer: Self-pay

## 2019-01-10 DIAGNOSIS — I4821 Permanent atrial fibrillation: Secondary | ICD-10-CM

## 2019-01-10 DIAGNOSIS — Z7901 Long term (current) use of anticoagulants: Secondary | ICD-10-CM | POA: Diagnosis not present

## 2019-01-10 LAB — POCT INR: INR: 2.1 (ref 2.0–3.0)

## 2019-01-10 NOTE — Patient Instructions (Signed)
Description   Spoke with pt continue taking 1/2 tablet daily except 1 tablet each Tueasdays,  Thursdays, and Saturdays. Please repeat INR in 4 weeks.

## 2019-01-29 ENCOUNTER — Telehealth: Payer: Self-pay | Admitting: Pharmacist

## 2019-01-29 NOTE — Telephone Encounter (Signed)
Patient on warfarin, started fluconazole 150mg  yesterday. Only 1 dose given. Last INR 2.1 on 01/10/2019.  Recommendation:  1. Skip warfarin 1 tab today, then resume stable daily dose  2. Repeat INR as previously scheduled (02/07/2019)

## 2019-02-05 ENCOUNTER — Other Ambulatory Visit: Payer: Self-pay | Admitting: Pharmacist Clinician (PhC)/ Clinical Pharmacy Specialist

## 2019-02-05 DIAGNOSIS — I4821 Permanent atrial fibrillation: Secondary | ICD-10-CM

## 2019-02-07 ENCOUNTER — Ambulatory Visit (INDEPENDENT_AMBULATORY_CARE_PROVIDER_SITE_OTHER): Payer: Medicare HMO | Admitting: Pharmacist Clinician (PhC)/ Clinical Pharmacy Specialist

## 2019-02-07 DIAGNOSIS — I4821 Permanent atrial fibrillation: Secondary | ICD-10-CM | POA: Diagnosis not present

## 2019-02-07 DIAGNOSIS — Z7901 Long term (current) use of anticoagulants: Secondary | ICD-10-CM | POA: Diagnosis not present

## 2019-02-07 LAB — PROTIME-INR
INR: 1.8 — ABNORMAL HIGH (ref 0.8–1.2)
Prothrombin Time: 18.7 s — ABNORMAL HIGH (ref 9.1–12.0)

## 2019-03-02 LAB — PROTIME-INR
INR: 1.8 — ABNORMAL HIGH (ref 0.8–1.2)
Prothrombin Time: 18.6 s — ABNORMAL HIGH (ref 9.1–12.0)

## 2019-03-04 ENCOUNTER — Ambulatory Visit (INDEPENDENT_AMBULATORY_CARE_PROVIDER_SITE_OTHER): Payer: Medicare HMO | Admitting: Cardiology

## 2019-03-04 DIAGNOSIS — I4821 Permanent atrial fibrillation: Secondary | ICD-10-CM

## 2019-03-04 DIAGNOSIS — Z7901 Long term (current) use of anticoagulants: Secondary | ICD-10-CM

## 2019-03-06 ENCOUNTER — Other Ambulatory Visit: Payer: Self-pay | Admitting: Cardiovascular Disease

## 2019-03-06 LAB — PROTIME-INR: INR: 2.1 — AB (ref 0.9–1.1)

## 2019-03-07 LAB — PROTIME-INR
INR: 2.1 — ABNORMAL HIGH (ref 0.8–1.2)
Prothrombin Time: 21.9 s — ABNORMAL HIGH (ref 9.1–12.0)

## 2019-03-08 ENCOUNTER — Ambulatory Visit (INDEPENDENT_AMBULATORY_CARE_PROVIDER_SITE_OTHER): Payer: Medicare HMO | Admitting: Cardiology

## 2019-03-08 DIAGNOSIS — I4821 Permanent atrial fibrillation: Secondary | ICD-10-CM | POA: Diagnosis not present

## 2019-03-08 DIAGNOSIS — Z7901 Long term (current) use of anticoagulants: Secondary | ICD-10-CM

## 2019-03-26 ENCOUNTER — Other Ambulatory Visit: Payer: Self-pay | Admitting: Cardiovascular Disease

## 2019-03-27 LAB — PROTIME-INR
INR: 1.4 — ABNORMAL HIGH (ref 0.8–1.2)
Prothrombin Time: 15.2 s — ABNORMAL HIGH (ref 9.1–12.0)

## 2019-03-29 ENCOUNTER — Ambulatory Visit (INDEPENDENT_AMBULATORY_CARE_PROVIDER_SITE_OTHER): Payer: Medicare HMO | Admitting: Pharmacist Clinician (PhC)/ Clinical Pharmacy Specialist

## 2019-03-29 DIAGNOSIS — I4821 Permanent atrial fibrillation: Secondary | ICD-10-CM | POA: Diagnosis not present

## 2019-03-29 DIAGNOSIS — Z7901 Long term (current) use of anticoagulants: Secondary | ICD-10-CM | POA: Diagnosis not present

## 2019-04-25 ENCOUNTER — Other Ambulatory Visit: Payer: Self-pay | Admitting: Cardiovascular Disease

## 2019-04-26 ENCOUNTER — Ambulatory Visit (INDEPENDENT_AMBULATORY_CARE_PROVIDER_SITE_OTHER): Payer: Medicare HMO | Admitting: Pharmacist Clinician (PhC)/ Clinical Pharmacy Specialist

## 2019-04-26 DIAGNOSIS — I4821 Permanent atrial fibrillation: Secondary | ICD-10-CM | POA: Diagnosis not present

## 2019-04-26 DIAGNOSIS — Z7901 Long term (current) use of anticoagulants: Secondary | ICD-10-CM

## 2019-04-26 LAB — PROTIME-INR
INR: 1.5 — ABNORMAL HIGH (ref 0.8–1.2)
Prothrombin Time: 15.6 s — ABNORMAL HIGH (ref 9.1–12.0)

## 2019-05-10 ENCOUNTER — Other Ambulatory Visit: Payer: Self-pay | Admitting: Pharmacist Clinician (PhC)/ Clinical Pharmacy Specialist

## 2019-05-10 DIAGNOSIS — Z7901 Long term (current) use of anticoagulants: Secondary | ICD-10-CM

## 2019-05-10 DIAGNOSIS — I4821 Permanent atrial fibrillation: Secondary | ICD-10-CM

## 2019-05-11 LAB — PROTIME-INR
INR: 1.8 — ABNORMAL HIGH (ref 0.8–1.2)
Prothrombin Time: 18.4 s — ABNORMAL HIGH (ref 9.1–12.0)

## 2019-05-13 ENCOUNTER — Other Ambulatory Visit: Payer: Self-pay | Admitting: Cardiovascular Disease

## 2019-05-13 ENCOUNTER — Ambulatory Visit (INDEPENDENT_AMBULATORY_CARE_PROVIDER_SITE_OTHER): Payer: Medicare HMO | Admitting: Pharmacist Clinician (PhC)/ Clinical Pharmacy Specialist

## 2019-05-13 DIAGNOSIS — Z7901 Long term (current) use of anticoagulants: Secondary | ICD-10-CM | POA: Diagnosis not present

## 2019-05-13 DIAGNOSIS — I4821 Permanent atrial fibrillation: Secondary | ICD-10-CM | POA: Diagnosis not present

## 2019-05-21 ENCOUNTER — Other Ambulatory Visit: Payer: Self-pay | Admitting: Physician Assistant

## 2019-06-10 ENCOUNTER — Other Ambulatory Visit: Payer: Self-pay

## 2019-06-10 MED ORDER — FUROSEMIDE 40 MG PO TABS: 40.0000 mg | ORAL_TABLET | Freq: Every day | ORAL | 1 refills | Status: DC

## 2019-06-13 ENCOUNTER — Other Ambulatory Visit: Payer: Self-pay | Admitting: Cardiovascular Disease

## 2019-06-13 LAB — POCT INR: INR: 3.9 — AB (ref 2.0–3.0)

## 2019-06-14 ENCOUNTER — Telehealth: Payer: Self-pay | Admitting: Cardiovascular Disease

## 2019-06-14 LAB — PROTIME-INR
INR: 3.9 — ABNORMAL HIGH (ref 0.9–1.2)
Prothrombin Time: 39.3 s — ABNORMAL HIGH (ref 9.1–12.0)

## 2019-06-14 NOTE — Telephone Encounter (Signed)
Patient is calling about her blood work, would like a call back.

## 2019-06-17 ENCOUNTER — Ambulatory Visit (INDEPENDENT_AMBULATORY_CARE_PROVIDER_SITE_OTHER): Payer: Medicare HMO | Admitting: Cardiology

## 2019-06-17 DIAGNOSIS — I4821 Permanent atrial fibrillation: Secondary | ICD-10-CM | POA: Diagnosis not present

## 2019-06-17 DIAGNOSIS — Z7901 Long term (current) use of anticoagulants: Secondary | ICD-10-CM

## 2019-06-17 NOTE — Telephone Encounter (Signed)
Called to ask the pt about completing inr and they stated that they did it. We haven't received it yet so I will call [facility] and make sure that we get the results.

## 2019-07-02 ENCOUNTER — Ambulatory Visit (INDEPENDENT_AMBULATORY_CARE_PROVIDER_SITE_OTHER): Payer: Medicare HMO | Admitting: Pharmacist

## 2019-07-02 ENCOUNTER — Other Ambulatory Visit: Payer: Self-pay | Admitting: Cardiovascular Disease

## 2019-07-02 ENCOUNTER — Other Ambulatory Visit: Payer: Self-pay

## 2019-07-02 DIAGNOSIS — Z7901 Long term (current) use of anticoagulants: Secondary | ICD-10-CM | POA: Diagnosis not present

## 2019-07-02 DIAGNOSIS — I4821 Permanent atrial fibrillation: Secondary | ICD-10-CM

## 2019-07-02 LAB — POCT INR: INR: 2.7 (ref 2.0–3.0)

## 2019-07-02 NOTE — Progress Notes (Signed)
inr

## 2019-08-13 ENCOUNTER — Encounter (INDEPENDENT_AMBULATORY_CARE_PROVIDER_SITE_OTHER): Payer: Self-pay

## 2019-08-13 ENCOUNTER — Other Ambulatory Visit: Payer: Self-pay

## 2019-08-13 ENCOUNTER — Ambulatory Visit (INDEPENDENT_AMBULATORY_CARE_PROVIDER_SITE_OTHER): Payer: Medicare HMO | Admitting: Pharmacist Clinician (PhC)/ Clinical Pharmacy Specialist

## 2019-08-13 DIAGNOSIS — Z7901 Long term (current) use of anticoagulants: Secondary | ICD-10-CM

## 2019-08-13 DIAGNOSIS — I4821 Permanent atrial fibrillation: Secondary | ICD-10-CM | POA: Diagnosis not present

## 2019-08-13 LAB — POCT INR: INR: 4.3 — AB (ref 2.0–3.0)

## 2019-08-28 ENCOUNTER — Other Ambulatory Visit: Payer: Self-pay | Admitting: Cardiovascular Disease

## 2019-08-28 ENCOUNTER — Other Ambulatory Visit: Payer: Self-pay

## 2019-08-28 MED ORDER — LOSARTAN POTASSIUM 50 MG PO TABS: 50.0000 mg | ORAL_TABLET | Freq: Every day | ORAL | 0 refills | Status: DC

## 2019-08-28 NOTE — Telephone Encounter (Signed)
Pt calling stating that she needs a short supply sent to local pharmacy until mail order arrives, about a week supply. Please address

## 2019-09-02 ENCOUNTER — Other Ambulatory Visit: Payer: Self-pay

## 2019-09-02 MED ORDER — LOSARTAN POTASSIUM 50 MG PO TABS: 50.0000 mg | ORAL_TABLET | Freq: Every day | ORAL | 0 refills | Status: DC

## 2019-09-06 ENCOUNTER — Other Ambulatory Visit: Payer: Self-pay | Admitting: Cardiovascular Disease

## 2019-09-06 MED ORDER — LOSARTAN POTASSIUM 50 MG PO TABS: 50.0000 mg | ORAL_TABLET | Freq: Every day | ORAL | 0 refills | Status: DC

## 2019-10-07 ENCOUNTER — Other Ambulatory Visit: Payer: Self-pay

## 2019-10-07 DIAGNOSIS — Z7901 Long term (current) use of anticoagulants: Secondary | ICD-10-CM

## 2019-10-07 DIAGNOSIS — I4821 Permanent atrial fibrillation: Secondary | ICD-10-CM

## 2019-10-10 ENCOUNTER — Ambulatory Visit (INDEPENDENT_AMBULATORY_CARE_PROVIDER_SITE_OTHER): Payer: Medicare HMO | Admitting: Pharmacist Clinician (PhC)/ Clinical Pharmacy Specialist

## 2019-10-10 DIAGNOSIS — Z7901 Long term (current) use of anticoagulants: Secondary | ICD-10-CM | POA: Diagnosis not present

## 2019-10-10 DIAGNOSIS — I4821 Permanent atrial fibrillation: Secondary | ICD-10-CM | POA: Diagnosis not present

## 2019-10-10 LAB — PROTIME-INR
INR: 3.2 — ABNORMAL HIGH (ref 0.9–1.2)
Prothrombin Time: 32.9 s — ABNORMAL HIGH (ref 9.1–12.0)

## 2019-10-25 ENCOUNTER — Other Ambulatory Visit: Payer: Self-pay | Admitting: Cardiovascular Disease

## 2019-11-20 LAB — POCT INR: INR: 4.4 — AB (ref 2.0–3.0)

## 2019-11-21 LAB — PROTIME-INR
INR: 4.4 — ABNORMAL HIGH (ref 0.9–1.2)
Prothrombin Time: 44.4 s — ABNORMAL HIGH (ref 9.1–12.0)

## 2019-11-25 ENCOUNTER — Ambulatory Visit (INDEPENDENT_AMBULATORY_CARE_PROVIDER_SITE_OTHER): Payer: Medicare HMO | Admitting: Internal Medicine

## 2019-11-25 DIAGNOSIS — Z7901 Long term (current) use of anticoagulants: Secondary | ICD-10-CM

## 2019-11-25 DIAGNOSIS — I4821 Permanent atrial fibrillation: Secondary | ICD-10-CM

## 2019-12-20 ENCOUNTER — Other Ambulatory Visit: Payer: Self-pay | Admitting: Cardiovascular Disease

## 2019-12-20 LAB — PROTIME-INR: INR: 6.3 — AB (ref 0.9–1.1)

## 2019-12-21 ENCOUNTER — Telehealth: Payer: Self-pay | Admitting: Student

## 2019-12-21 LAB — PROTIME-INR
INR: 6.3 (ref 0.9–1.2)
Prothrombin Time: 62.6 s — ABNORMAL HIGH (ref 9.1–12.0)

## 2019-12-21 NOTE — Telephone Encounter (Addendum)
     Received a call from [place] that the patient's INR was elevated to 6.3 on 4/30. I called the patient and spoke to her spouse as he said she was unable to come to the phone. He reported she has not mentioned any evidence of active bleeding. Advised she hold Coumadin today and tomorrow. Will likely need a repeat INR check and dose adjustment on Monday. Will forward to the Coumadin Clinic to follow-up with the patient on Monday.   I informed the patient's spouse to call back if they have any questions or she develops any evidence of active bleeding in the interim. He voiced understanding of this and was appreciative of the call.   Signed, Erma Heritage, PA-C 12/21/2019, 8:36 AM Pager: [phone]

## 2019-12-23 ENCOUNTER — Ambulatory Visit (INDEPENDENT_AMBULATORY_CARE_PROVIDER_SITE_OTHER): Payer: Medicare HMO | Admitting: Pharmacist

## 2019-12-23 ENCOUNTER — Other Ambulatory Visit: Payer: Self-pay

## 2019-12-23 DIAGNOSIS — I4821 Permanent atrial fibrillation: Secondary | ICD-10-CM | POA: Diagnosis not present

## 2019-12-23 DIAGNOSIS — Z7901 Long term (current) use of anticoagulants: Secondary | ICD-10-CM | POA: Diagnosis not present

## 2019-12-23 LAB — POCT INR: INR: 4.8 — AB (ref 2.0–3.0)

## 2019-12-23 NOTE — Telephone Encounter (Signed)
Will contact patient for INR in Clinic today 12/23/2019.

## 2019-12-23 NOTE — Patient Instructions (Signed)
HOLD warfarin dose today 5/3 and tomorrow 5/4, then resume at 1/2 tablet daily except 1 tablet each Tuesday, Thursday and Saturday.   Repeat INR in 10 days *Call clinic to report  medication changes and /or procedures*

## 2019-12-23 NOTE — Telephone Encounter (Signed)
Called and instructed the pt to go have lab checked asap due to the elevation. Pt voiced understanding and will go to [facility] asap

## 2019-12-31 ENCOUNTER — Other Ambulatory Visit: Payer: Self-pay | Admitting: Cardiovascular Disease

## 2020-01-03 ENCOUNTER — Other Ambulatory Visit: Payer: Self-pay

## 2020-01-03 ENCOUNTER — Other Ambulatory Visit: Payer: Self-pay | Admitting: Pharmacist Clinician (PhC)/ Clinical Pharmacy Specialist

## 2020-01-03 ENCOUNTER — Ambulatory Visit (INDEPENDENT_AMBULATORY_CARE_PROVIDER_SITE_OTHER): Payer: Medicare HMO | Admitting: Pharmacist Clinician (PhC)/ Clinical Pharmacy Specialist

## 2020-01-03 DIAGNOSIS — Z7901 Long term (current) use of anticoagulants: Secondary | ICD-10-CM

## 2020-01-03 DIAGNOSIS — I4821 Permanent atrial fibrillation: Secondary | ICD-10-CM

## 2020-01-03 LAB — POCT INR: INR: 5.2 — AB (ref 2.0–3.0)

## 2020-01-06 ENCOUNTER — Other Ambulatory Visit: Payer: Self-pay | Admitting: Orthopedic Surgery

## 2020-01-06 ENCOUNTER — Other Ambulatory Visit (HOSPITAL_COMMUNITY): Payer: Self-pay | Admitting: Orthopedic Surgery

## 2020-01-06 DIAGNOSIS — M25562 Pain in left knee: Secondary | ICD-10-CM

## 2020-01-08 ENCOUNTER — Telehealth: Payer: Self-pay | Admitting: Cardiovascular Disease

## 2020-01-08 NOTE — Telephone Encounter (Signed)
Called and lmomed the pt that for her own safety we need her to get labs done as she was high last week

## 2020-01-08 NOTE — Telephone Encounter (Signed)
She would like to wait until she gets back at [facility] to get her blood work done, she wants to know what she should do about her dosage until she gets back.  She will be back at the end of the month.  She states she can get her blood work done on 01/22/2020.

## 2020-01-09 ENCOUNTER — Encounter: Payer: Self-pay | Admitting: Cardiovascular Disease

## 2020-01-09 NOTE — Telephone Encounter (Signed)
Error

## 2020-01-29 ENCOUNTER — Emergency Department (HOSPITAL_COMMUNITY)
Admission: EM | Admit: 2020-01-29 | Discharge: 2020-01-30 | Disposition: A | Discharge status: Home / Self Care | Payer: Medicare HMO | Attending: Emergency Medicine | Admitting: Emergency Medicine

## 2020-01-29 ENCOUNTER — Emergency Department (HOSPITAL_COMMUNITY): Payer: Medicare HMO

## 2020-01-29 ENCOUNTER — Other Ambulatory Visit: Payer: Self-pay

## 2020-01-29 DIAGNOSIS — Z7901 Long term (current) use of anticoagulants: Secondary | ICD-10-CM | POA: Insufficient documentation

## 2020-01-29 DIAGNOSIS — Y9389 Activity, other specified: Secondary | ICD-10-CM | POA: Diagnosis not present

## 2020-01-29 DIAGNOSIS — W19XXXA Unspecified fall, initial encounter: Secondary | ICD-10-CM

## 2020-01-29 DIAGNOSIS — S0990XA Unspecified injury of head, initial encounter: Secondary | ICD-10-CM | POA: Diagnosis present

## 2020-01-29 DIAGNOSIS — D6832 Hemorrhagic disorder due to extrinsic circulating anticoagulants: Secondary | ICD-10-CM

## 2020-01-29 DIAGNOSIS — S40011A Contusion of right shoulder, initial encounter: Secondary | ICD-10-CM

## 2020-01-29 DIAGNOSIS — Y929 Unspecified place or not applicable: Secondary | ICD-10-CM | POA: Diagnosis not present

## 2020-01-29 DIAGNOSIS — S300XXA Contusion of lower back and pelvis, initial encounter: Secondary | ICD-10-CM

## 2020-01-29 DIAGNOSIS — S0003XA Contusion of scalp, initial encounter: Secondary | ICD-10-CM | POA: Diagnosis not present

## 2020-01-29 DIAGNOSIS — Y999 Unspecified external cause status: Secondary | ICD-10-CM | POA: Diagnosis not present

## 2020-01-29 DIAGNOSIS — R918 Other nonspecific abnormal finding of lung field: Secondary | ICD-10-CM | POA: Diagnosis not present

## 2020-01-29 DIAGNOSIS — W01198A Fall on same level from slipping, tripping and stumbling with subsequent striking against other object, initial encounter: Secondary | ICD-10-CM | POA: Diagnosis not present

## 2020-01-29 DIAGNOSIS — I4891 Unspecified atrial fibrillation: Secondary | ICD-10-CM | POA: Insufficient documentation

## 2020-01-29 DIAGNOSIS — T45515A Adverse effect of anticoagulants, initial encounter: Secondary | ICD-10-CM

## 2020-01-29 IMAGING — DX DG SHOULDER 2+V*R*
2 series · 2 of 2 positions shown · non-contrast
Comparison: None.

CLINICAL DATA: Pain

EXAM:
RIGHT SHOULDER - 2+ VIEW

[shoulder grashey]
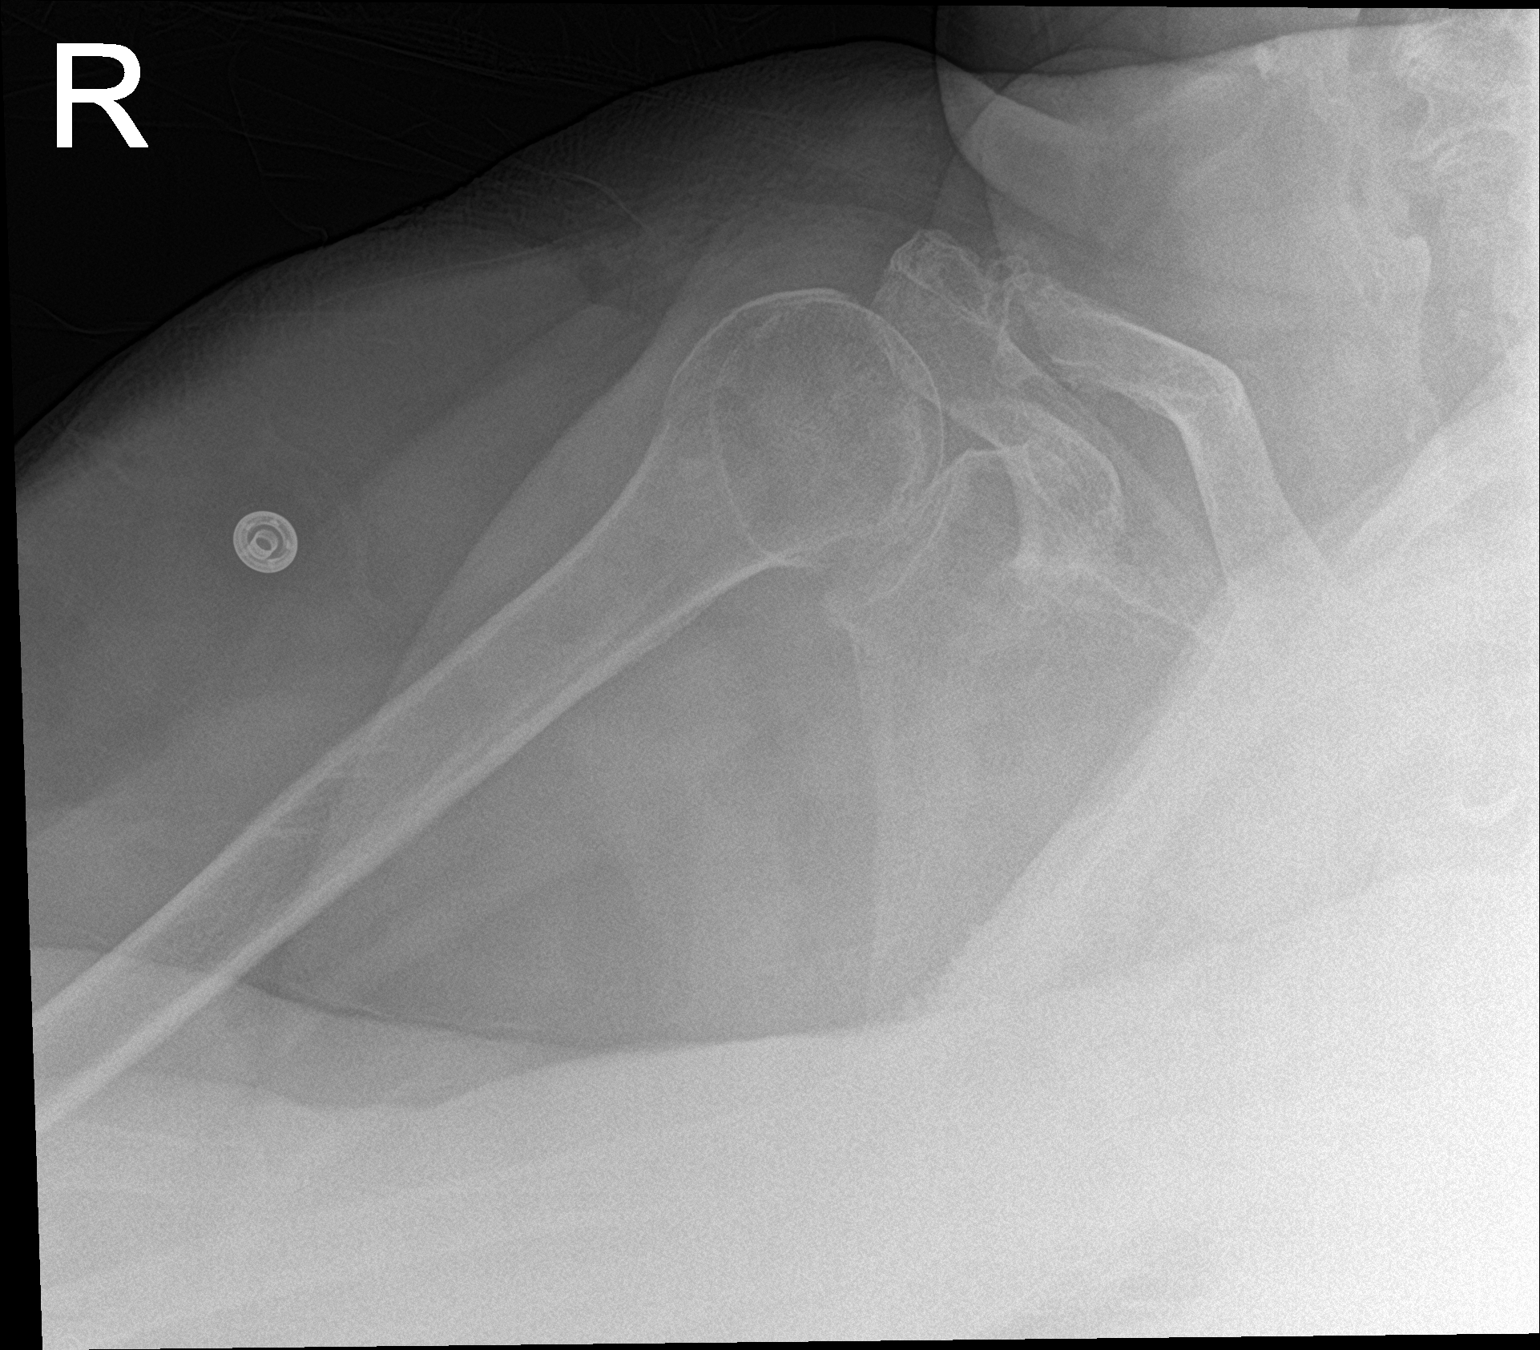

[shoulder y view]
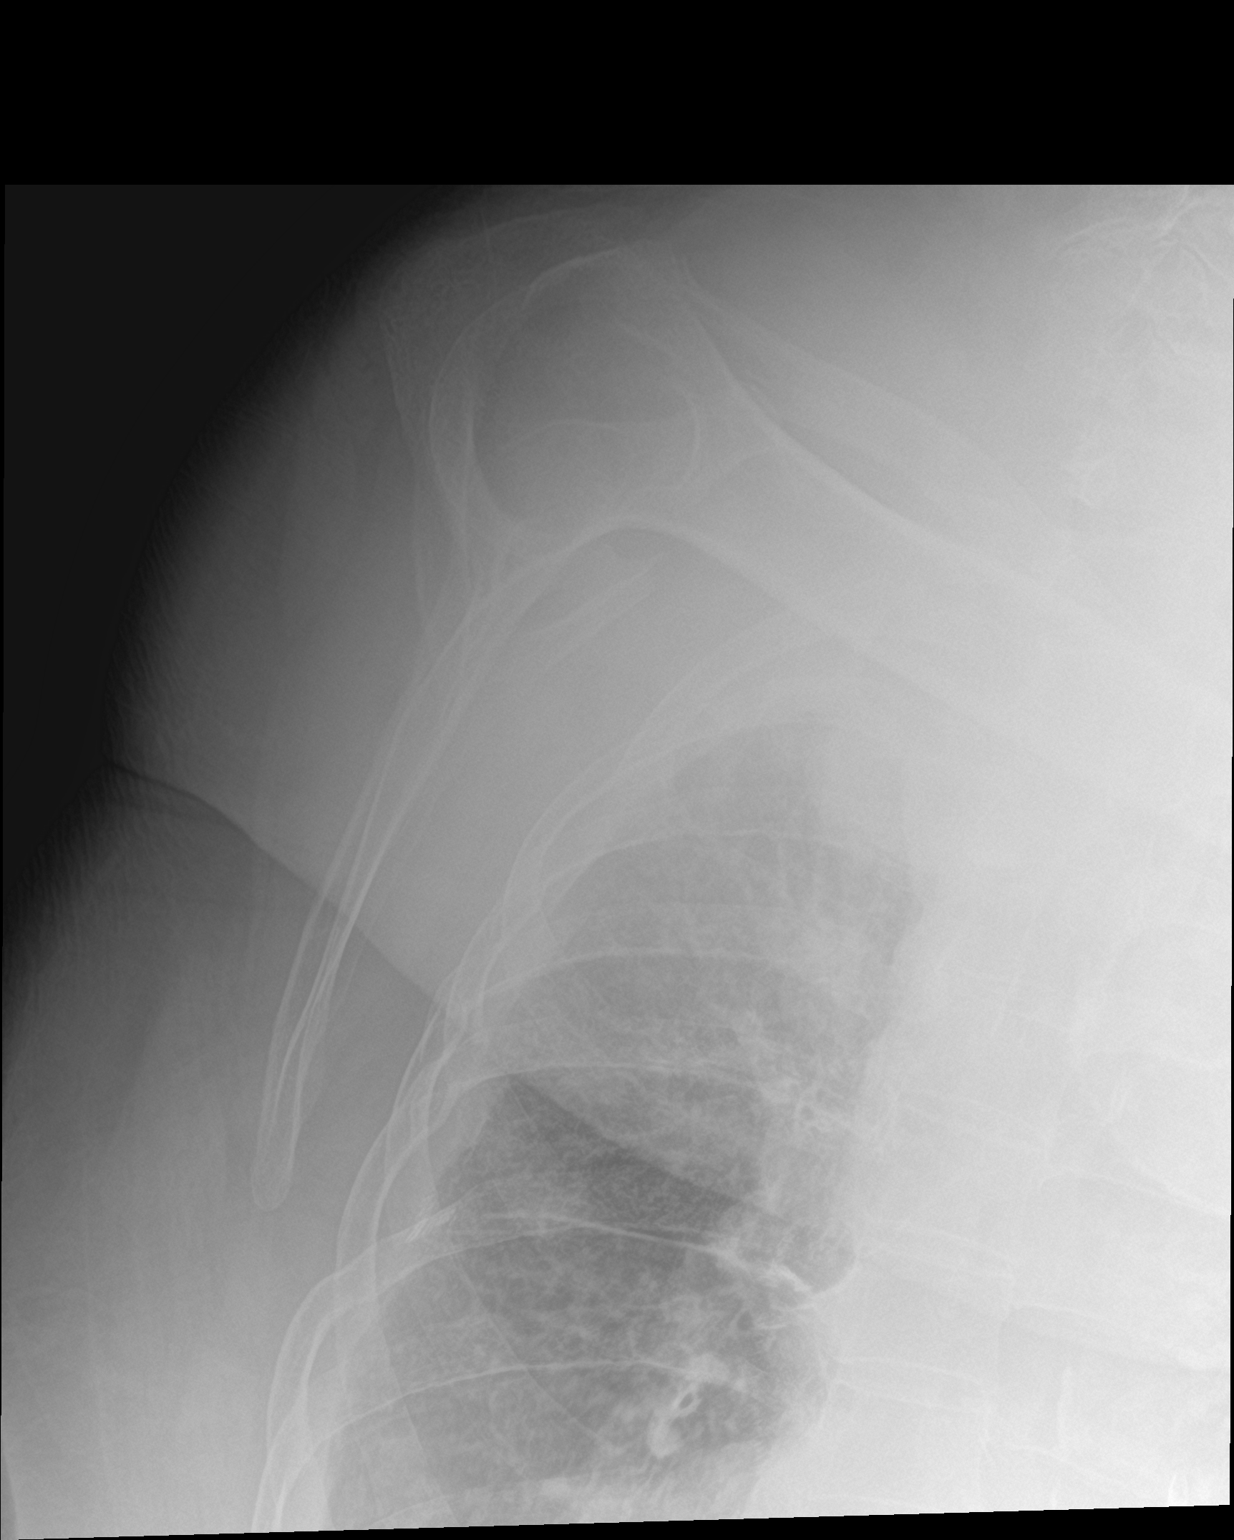

[2 of 2 positions shown; findings below may reference images not displayed]

FINDINGS: There is no acute displaced fracture or dislocation. Moderate
degenerative changes are noted of the right glenohumeral joint.
There are multiple old healed right-sided rib fractures. There are
degenerative changes of the right AC joint.
IMPRESSION: 1. No acute displaced fracture or dislocation.
2. Moderate degenerative changes of the right glenohumeral joint.

## 2020-01-29 IMAGING — CT CT HEAD W/O CM
4 series · 16 of 47 positions shown, 18 images · non-contrast
Comparison: None.

CLINICAL DATA: Status post fall.

EXAM:
CT HEAD WITHOUT CONTRAST
TECHNIQUE: Contiguous axial images were obtained from the base of the skull
through the vertex without intravenous contrast.

[Series 3: head without · axial · non-contrast · 0.49mm/px · z∈[-129,-4]mm · 7 of 35 slices shown, 9 images]
[im 5/35  brain]
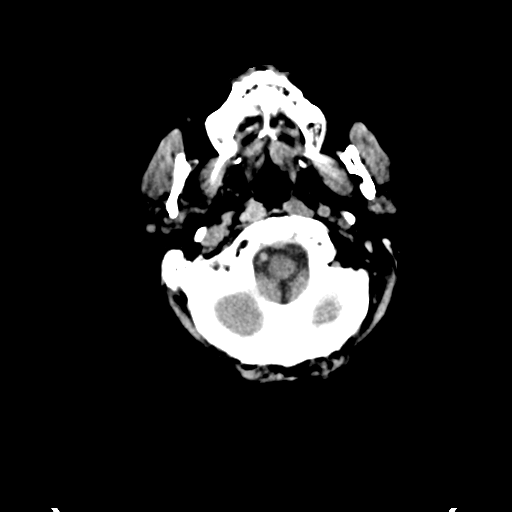
[im 5/35  bone]
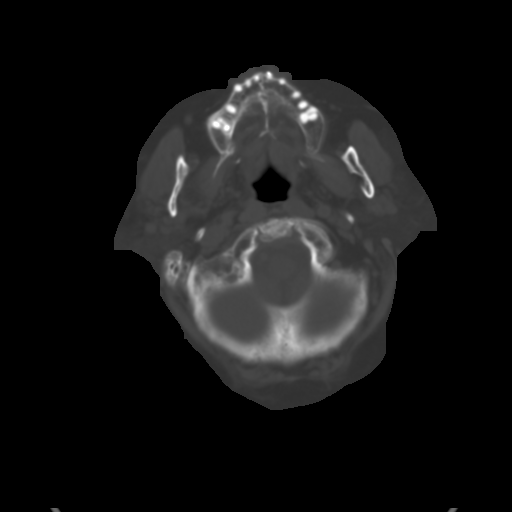
[im 9/35  brain]
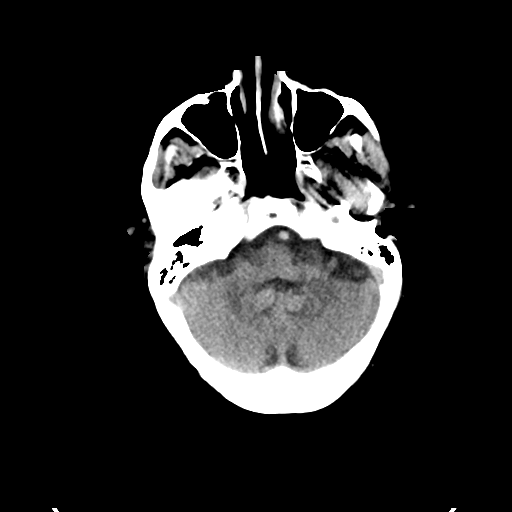
[im 13/35  brain]
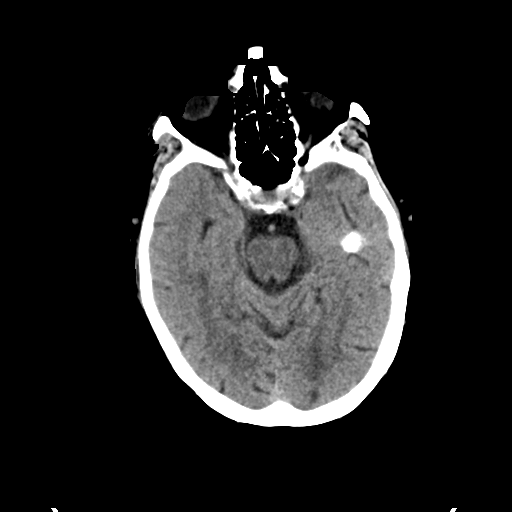
[im 18/35  brain]
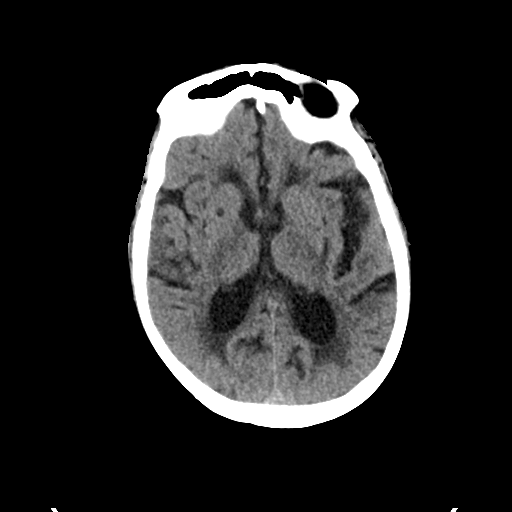
[im 22/35  brain]
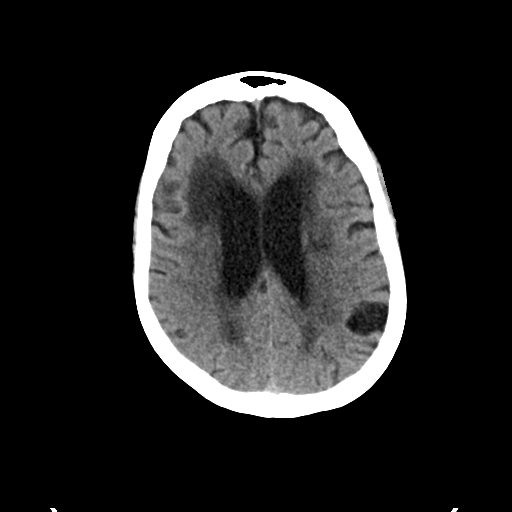
[im 22/35  bone]
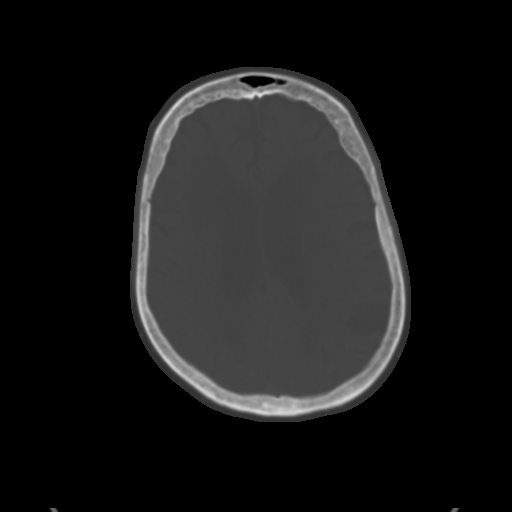
[im 26/35  brain]
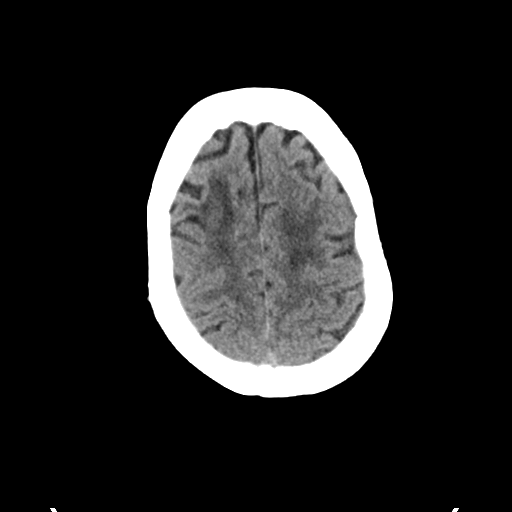
[im 30/35  brain]
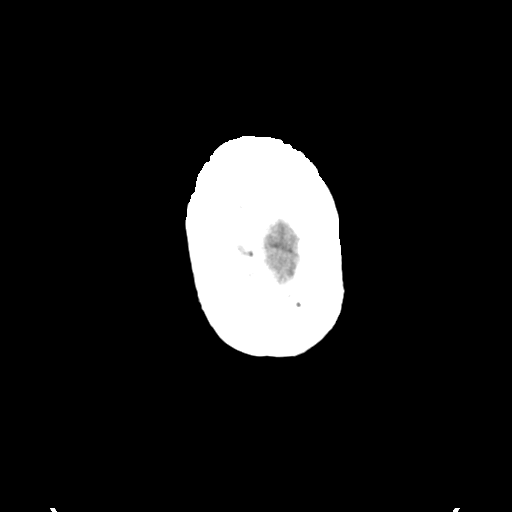

[Series 4: head bone · axial · 0.49mm/px · z∈[-133,-99]mm · 3 of 86 slices shown]
[im 9/86  bone]
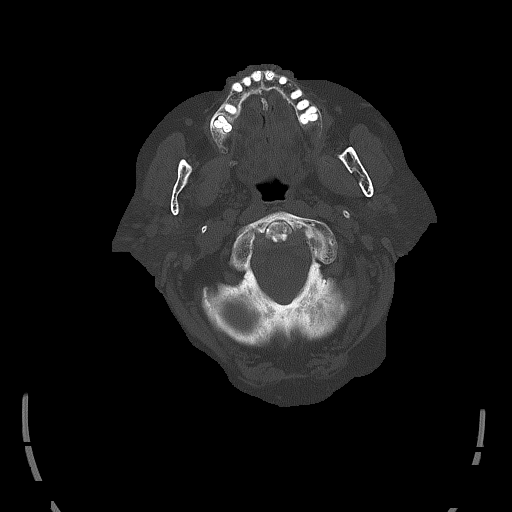
[im 18/86  bone]
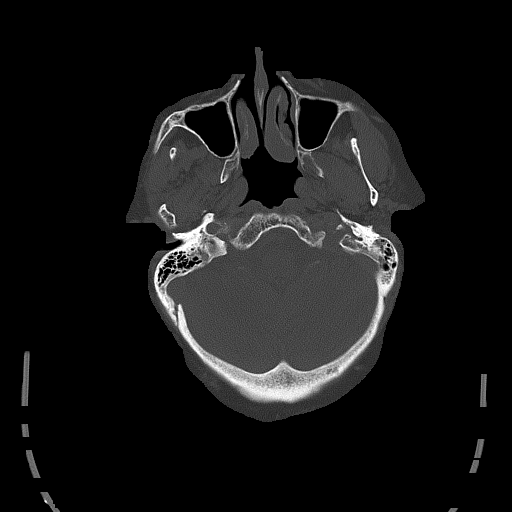
[im 26/86  bone]
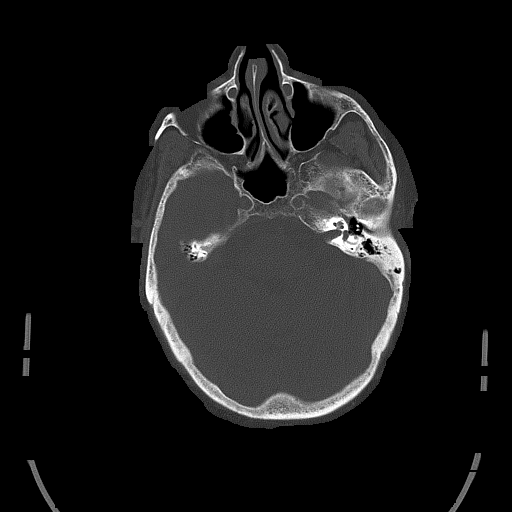

[Series 5: head without cor · coronal · non-contrast · 0.33mm/px · 3 of 69 slices shown]
[im 23/69  brain]
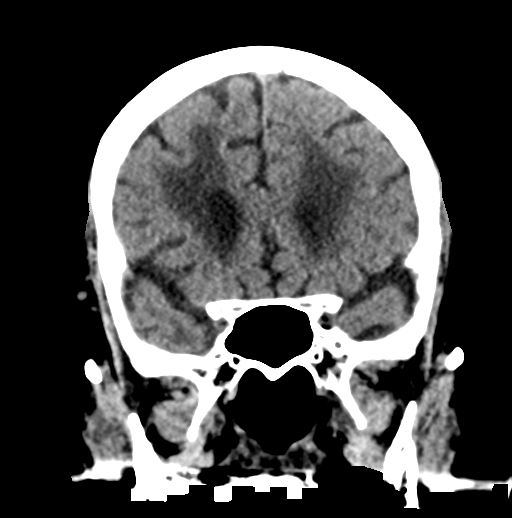
[im 31/69  brain]
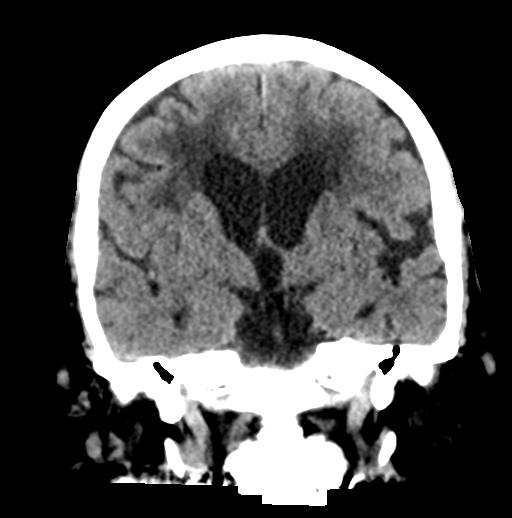
[im 38/69  brain]
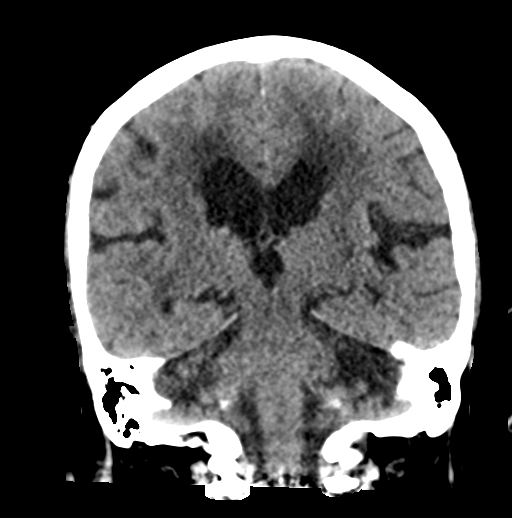

[Series 6: head without sag · sagittal · non-contrast · 0.34mm/px · 3 of 61 slices shown]
[im 21/61  brain]
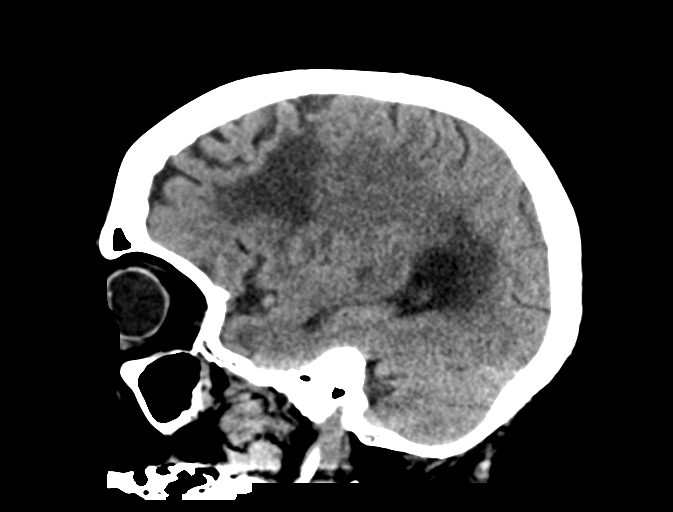
[im 31/61  brain]
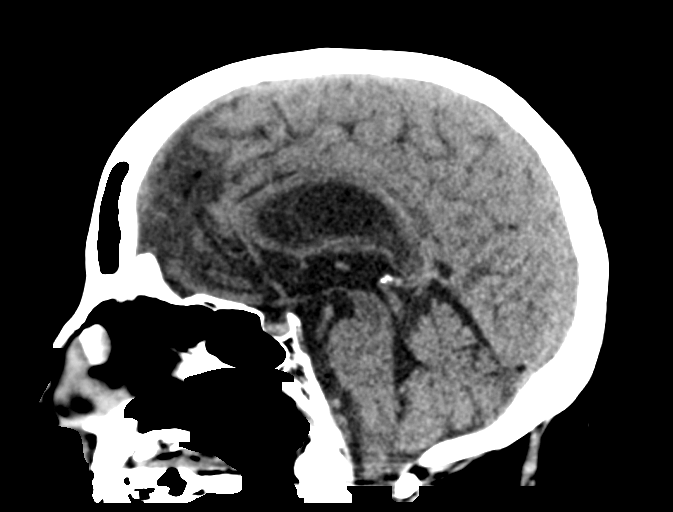
[im 41/61  brain]
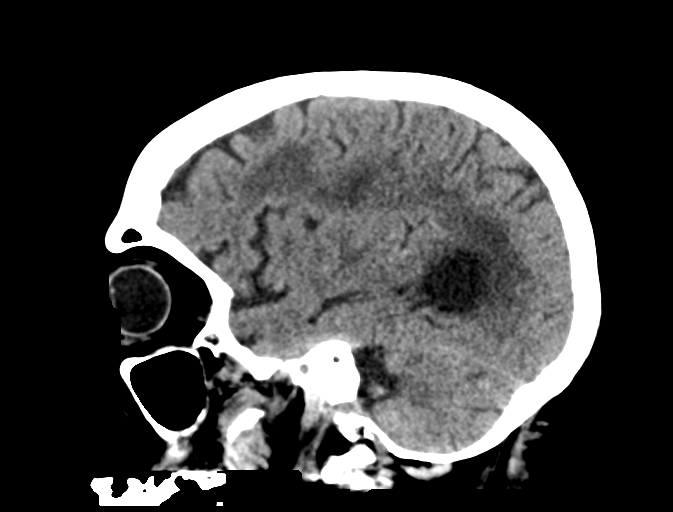

[16 of 47 positions shown; findings below may reference images not displayed]

FINDINGS: Brain: There is moderate severity cerebral atrophy with widening of
the extra-axial spaces and ventricular dilatation. Asymmetric sulcal
prominence is noted throughout the left hemisphere.
There are areas of decreased attenuation within the white matter
tracts of the supratentorial brain, consistent with microvascular
disease changes.

Small chronic bilateral basal ganglia lacunar infarcts are seen.

Vascular: No hyperdense vessel or unexpected calcification.

Skull: Normal. Negative for fracture or focal lesion.

Sinuses/Orbits: No acute finding.

Other: None.
IMPRESSION: 1. Generalized cerebral atrophy.
2. Small chronic bilateral basal ganglia lacunar infarcts.
3. No acute intracranial abnormality.

## 2020-01-29 IMAGING — CT CT CERVICAL SPINE W/O CM
3 of 4 series · 13 of 35 positions shown, 16 images · non-contrast
Comparison: None.

CLINICAL DATA: Status post fall.

EXAM:
CT CERVICAL SPINE WITHOUT CONTRAST
TECHNIQUE: Multidetector CT imaging of the cervical spine was performed without
intravenous contrast. Multiplanar CT image reconstructions were also
generated.

[Series 3: c_spine 2.0 st · axial · 0.28mm/px · z∈[-252,-126]mm · 5 of 95 slices shown, 7 images]
[im 16/95  soft-tissue]
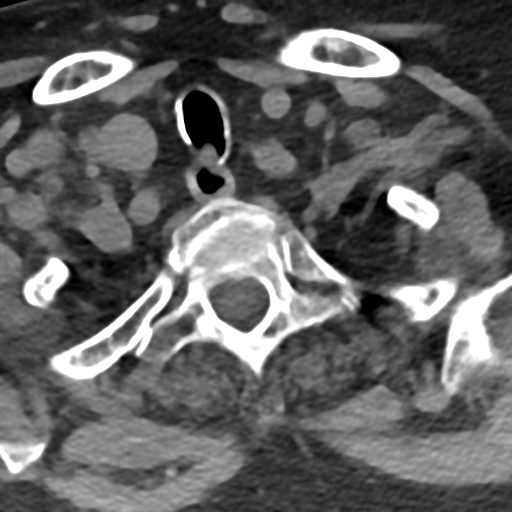
[im 16/95  bone]
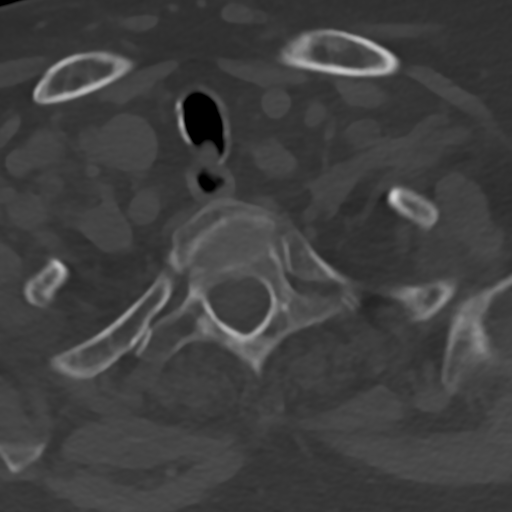
[im 32/95  bone]
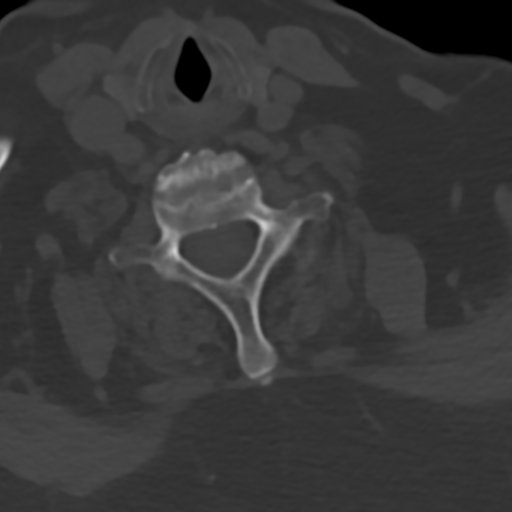
[im 48/95  bone]
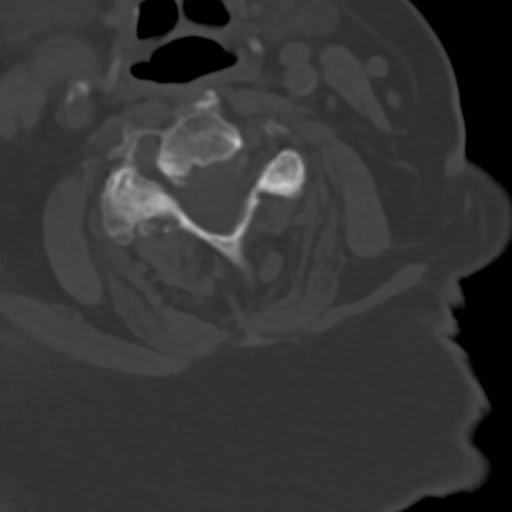
[im 63/95  bone]
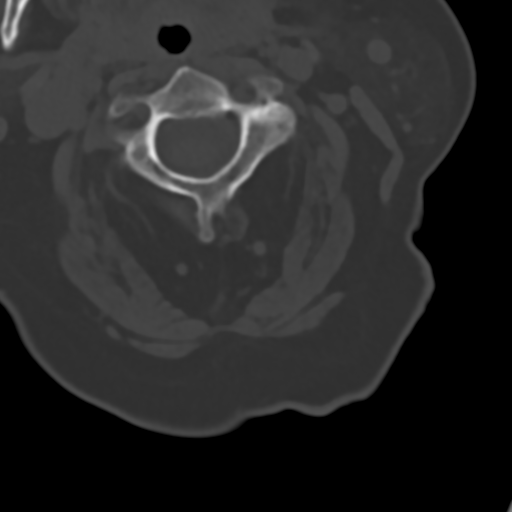
[im 79/95  soft-tissue]
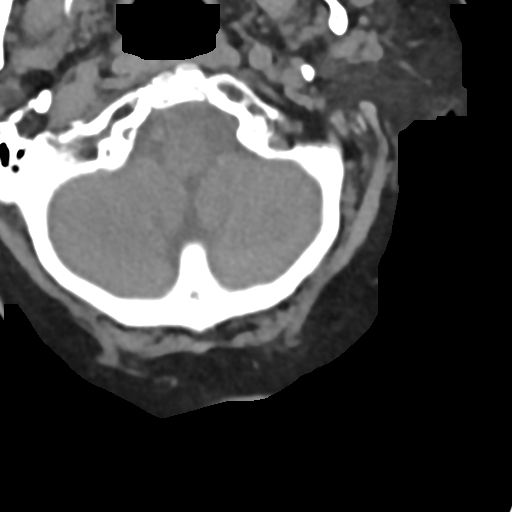
[im 79/95  bone]
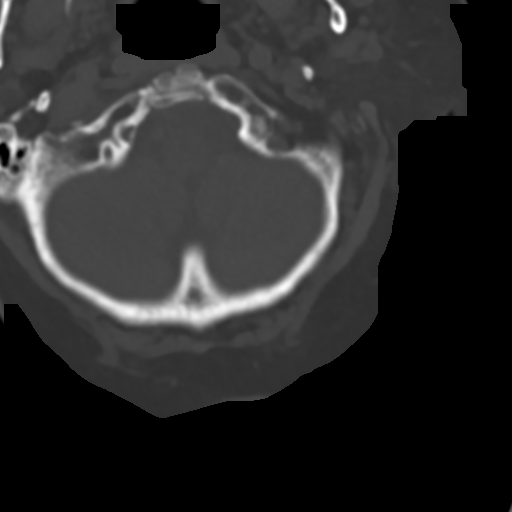

[Series 7: c_spine 2.0 sag bone · sagittal · 0.28mm/px · 5 of 61 slices shown, 6 images]
[im 21/61  bone]
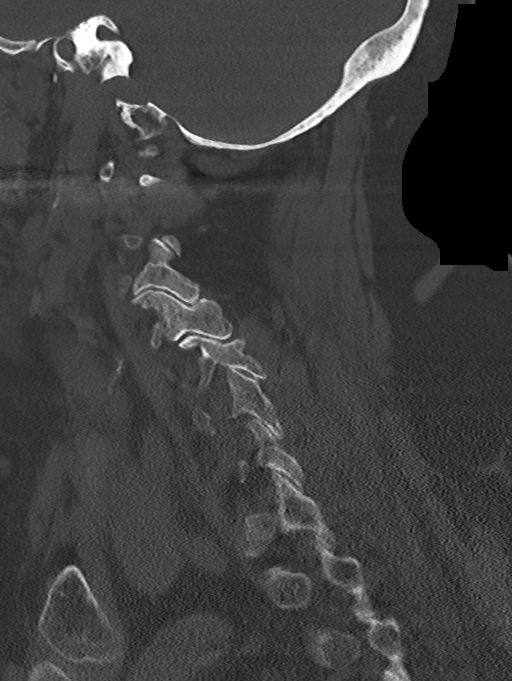
[im 26/61  bone]
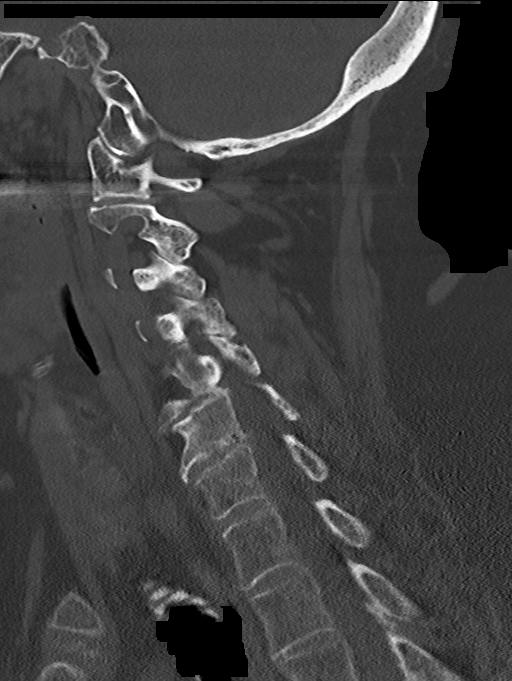
[im 31/61  soft-tissue]
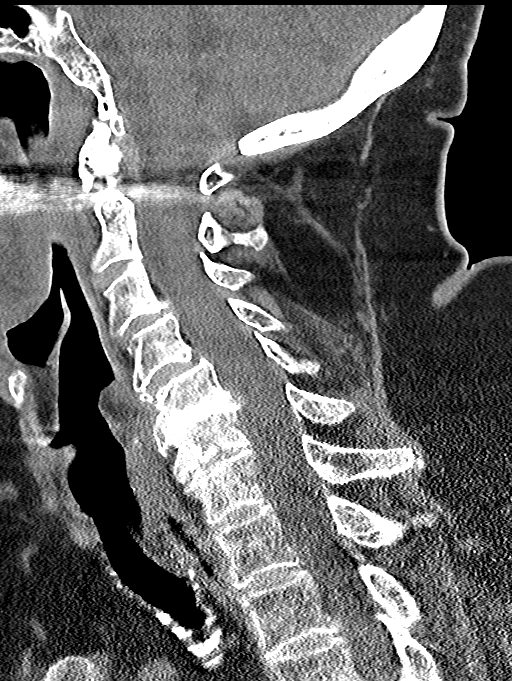
[im 31/61  bone]
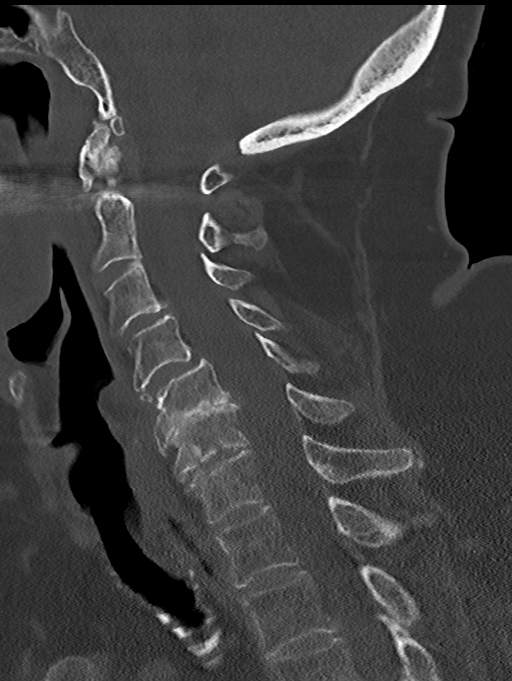
[im 36/61  bone]
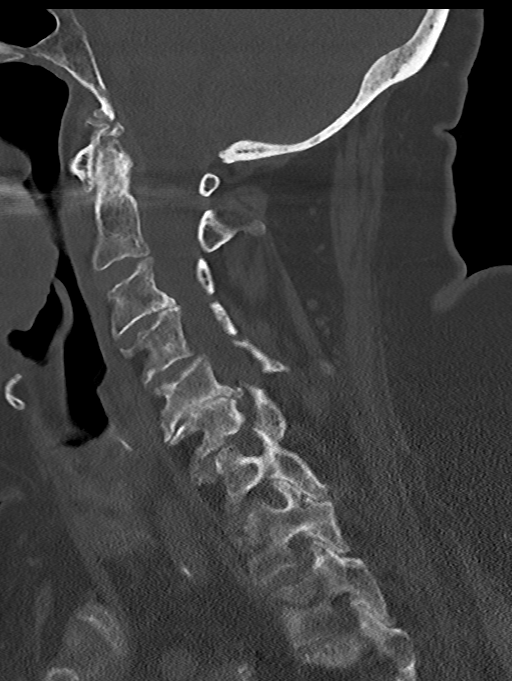
[im 41/61  bone]
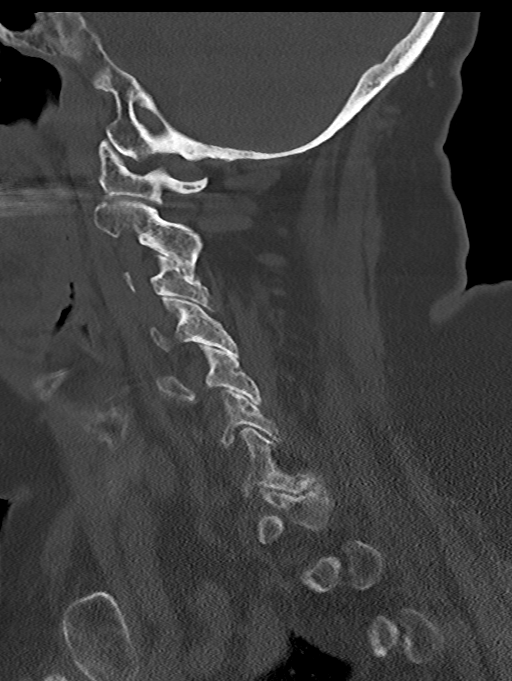

[Series 8: c_spine 2.0 cor bone · coronal · 0.28mm/px · 3 of 55 slices shown]
[im 11/55  bone]
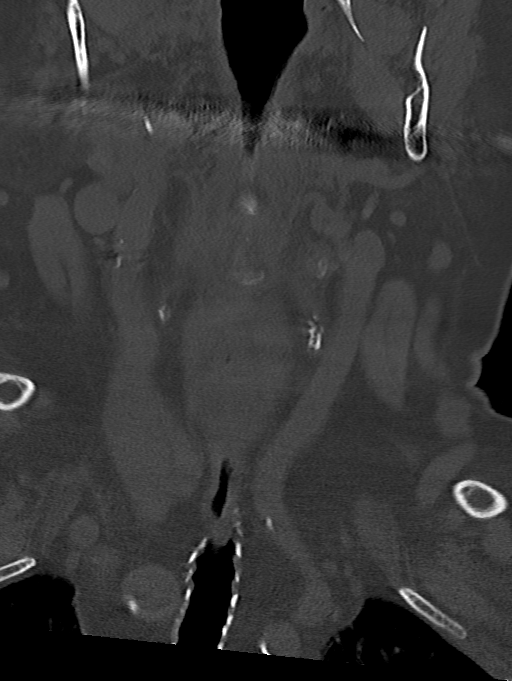
[im 22/55  bone]
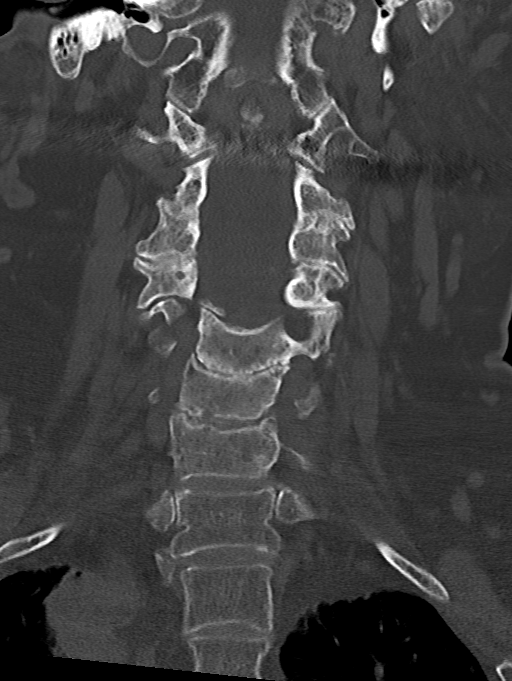
[im 33/55  bone]
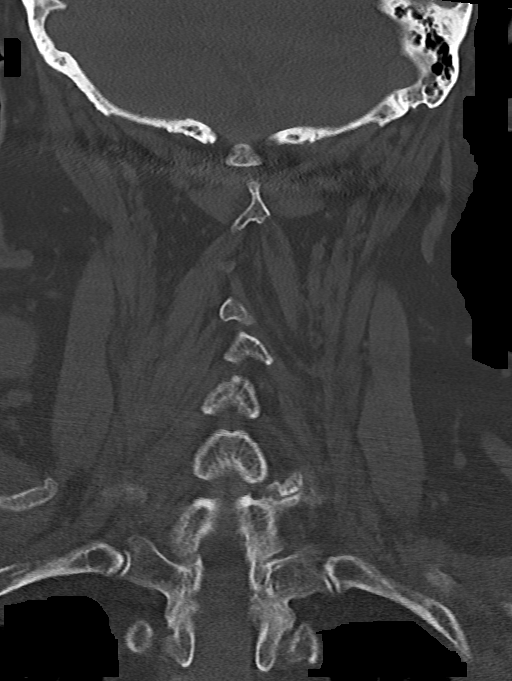

[13 of 35 positions shown; findings below may reference images not displayed]

FINDINGS: Alignment: There is approximately 2 mm anterolisthesis of the C4
vertebral body on C5.

Skull base and vertebrae: No acute fracture. No primary bone lesion
or focal pathologic process.

Soft tissues and spinal canal: No prevertebral fluid or swelling. No
visible canal hematoma.

Disc levels: Moderate to marked severity endplate sclerosis is seen
at the levels of C5-C6 and C6-C7.

Marked severity intervertebral disc space narrowing is seen at the
level of C5-C6 with moderate severity intervertebral disc space
narrowing seen at the level of C6-C7.

Multilevel moderate to marked severity bilateral facet joint
hypertrophy is seen.

Upper chest: A 2.7 cm x 2.1 cm x 2.5 cm low-attenuation soft tissue
mass is seen within the medial aspect of the right apex.

Other: None.
IMPRESSION: 1. No acute fracture within the cervical spine.
2. Marked severity degenerative changes at the levels of C5-C6 and
C6-C7.
3. A 2.7 cm x 2.1 cm x 2.5 cm low-attenuation right apical mass.
This finding is concerning for the presence of a pulmonary neoplasm.
Further evaluation with a nonemergent chest CT is recommended.

## 2020-01-29 IMAGING — DX DG LUMBAR SPINE COMPLETE 4+V
5 series · 5 of 5 positions shown · non-contrast
Comparison: None.

CLINICAL DATA: Pain

EXAM:
LUMBAR SPINE - COMPLETE 4+ VIEW

[l-spine obl (1 of 2)]
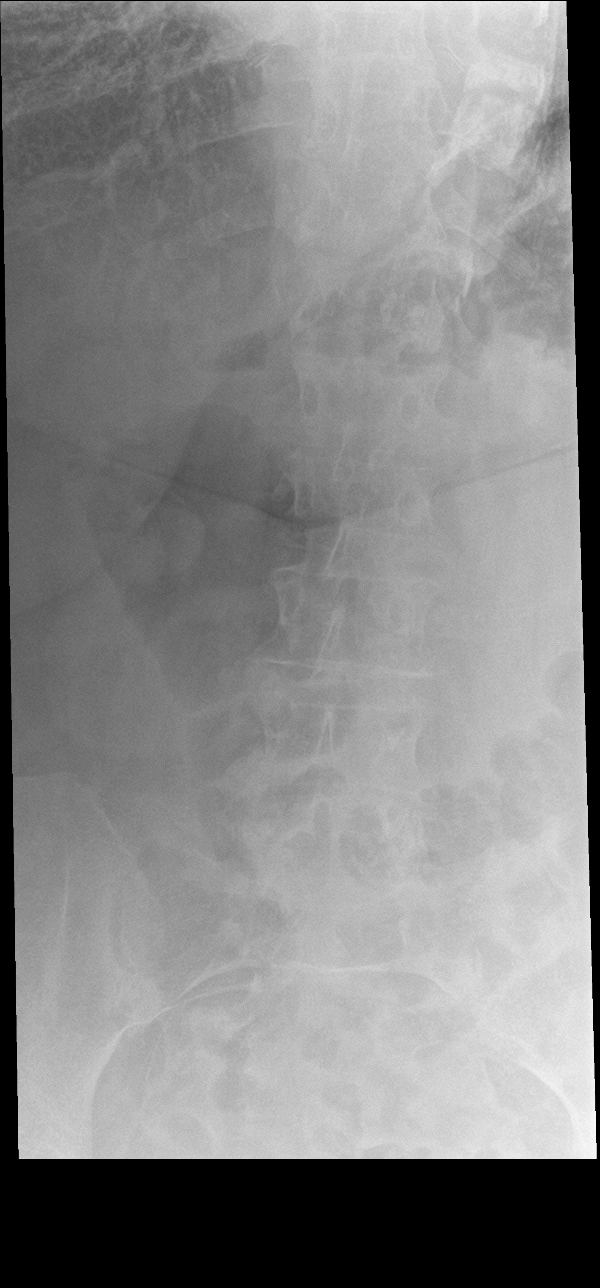

[l-spine obl (2 of 2)]
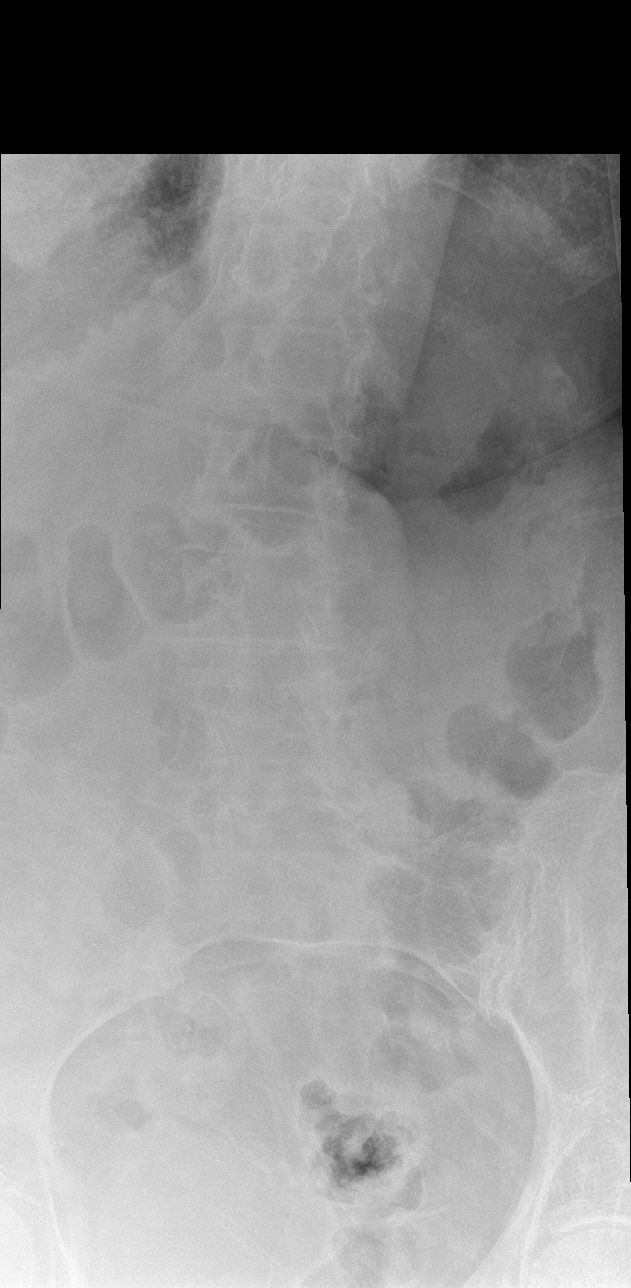

[l-spine lat]
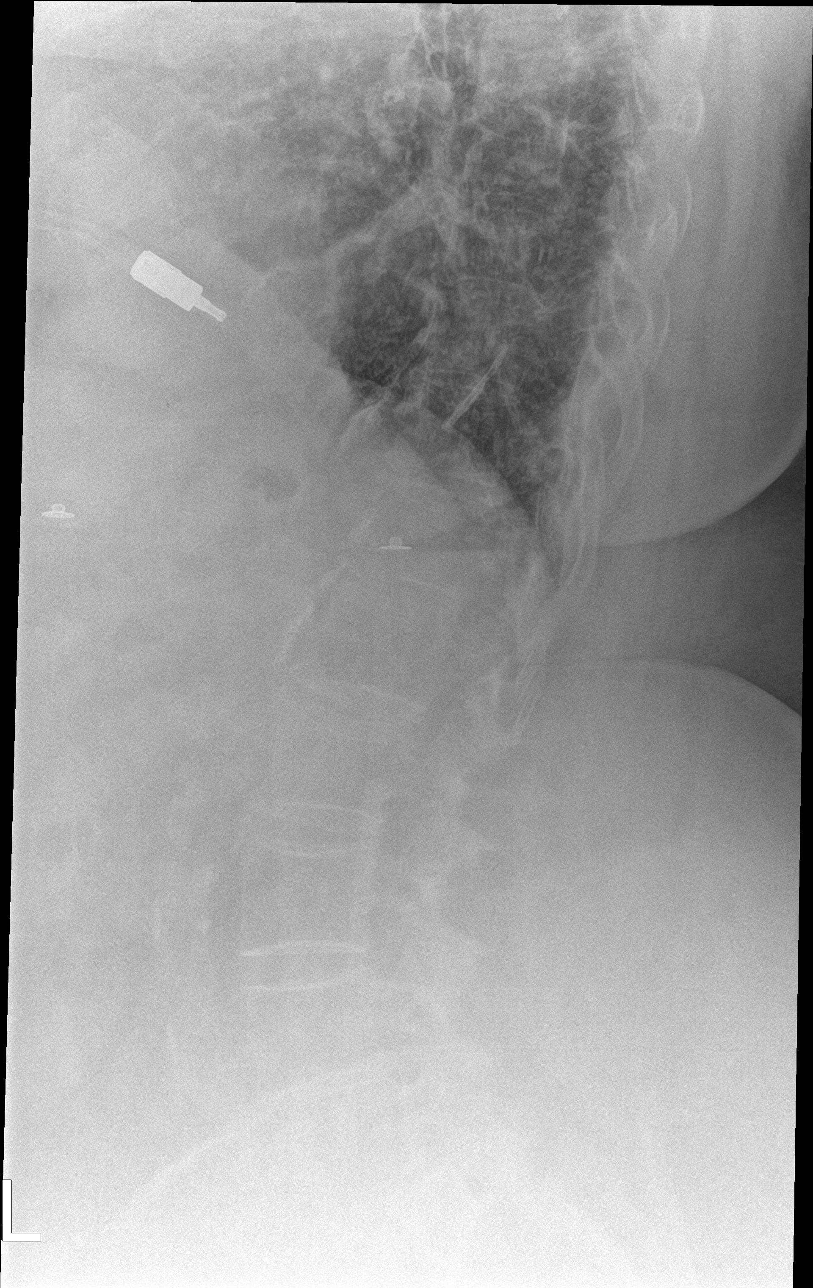

[l-spine spot]
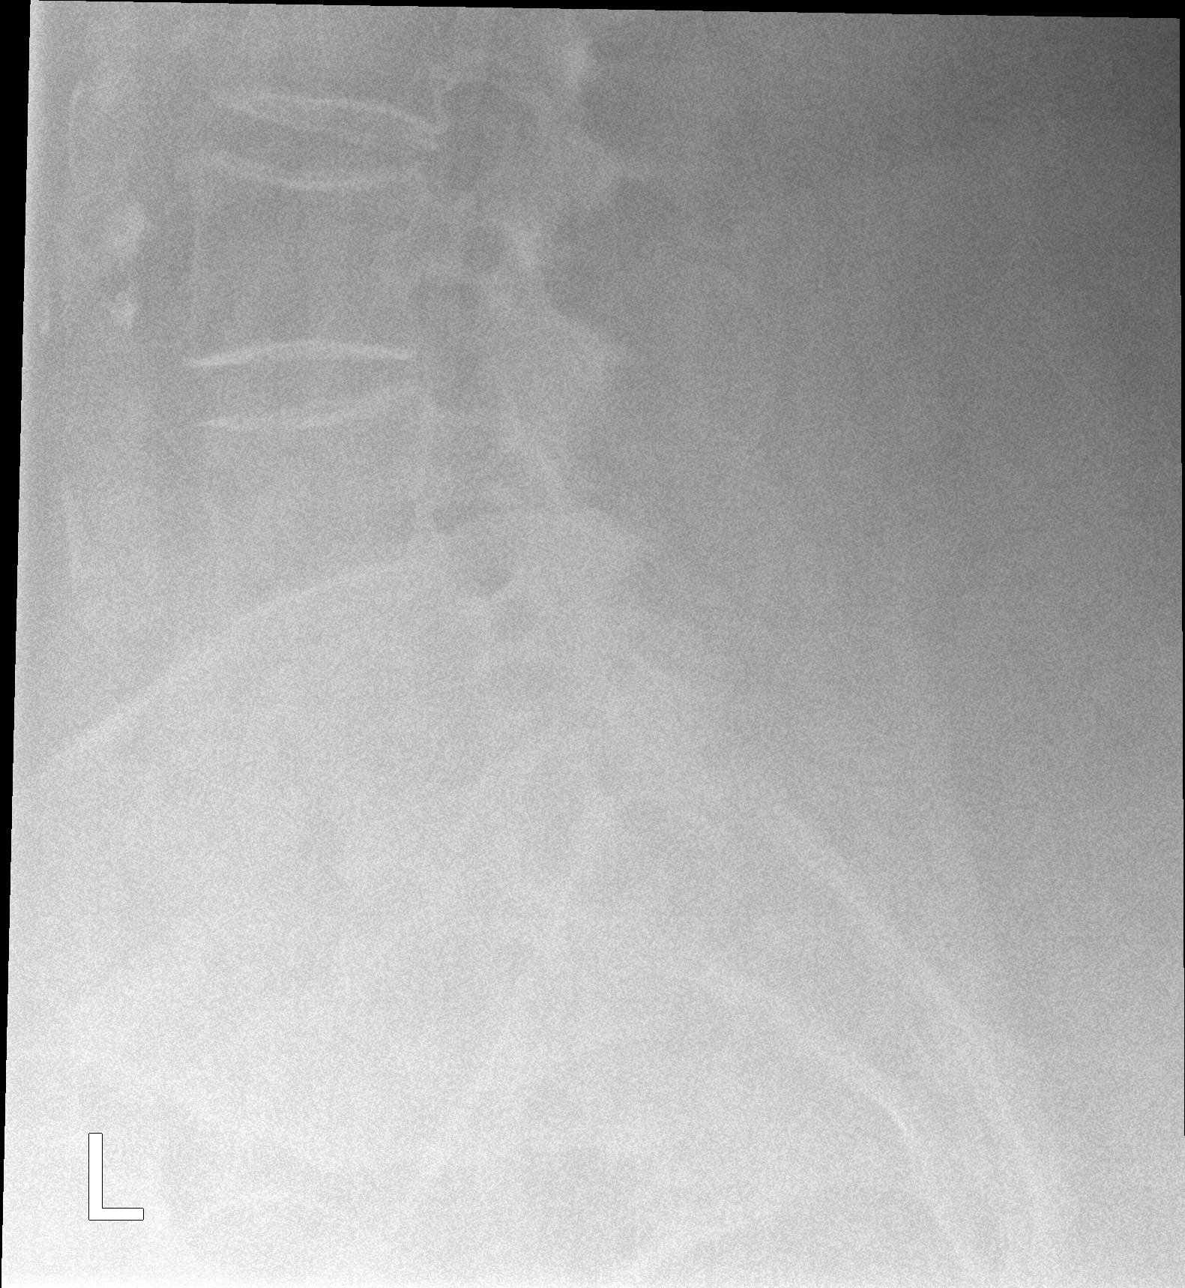

[l-spine ap]
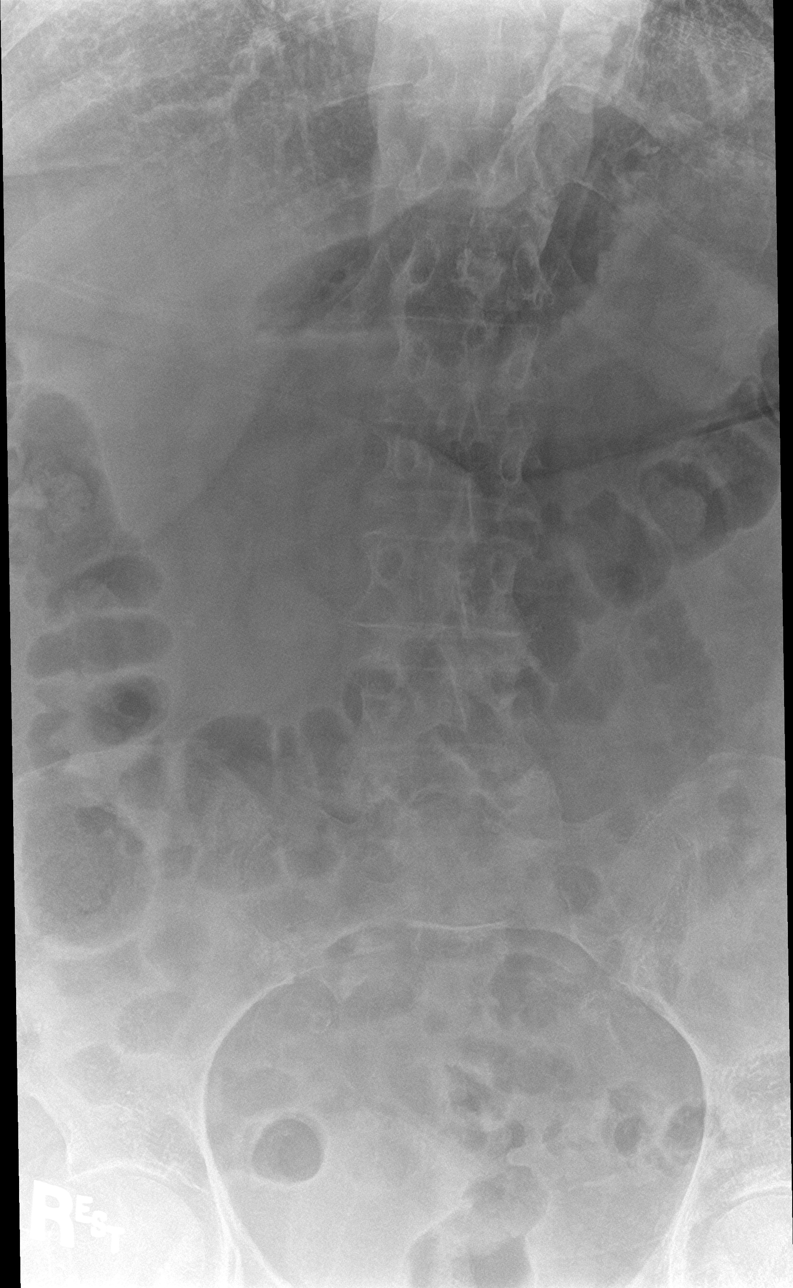

[5 of 5 positions shown; findings below may reference images not displayed]

FINDINGS: Examination is significantly limited by patient body habitus. There
is no definite acute displaced fracture. No definite malalignment.
Multilevel degenerative changes are noted throughout the lumbar
spine. Advanced vascular calcifications are noted.
IMPRESSION: 1. Very limited study secondary to patient body habitus.
2. No definite acute displaced fracture identified on this study.

## 2020-01-29 NOTE — ED Triage Notes (Signed)
Pt arrives from home via [facility]. Pt up in the bathroom, lost balance and fell. Unsure if it was from dizziness or gout issues. She was unable to stand well on EMS arrival. She did have a cane. She fell back, struck her head on the shower door. She is c/o pain in the right shoulder, back and head. She is on Coumadin for afib. EKG showed afib. 147/74, hr 90, cbg WNL.

## 2020-01-29 NOTE — ED Notes (Signed)
Ct notified pt is ready for transport.

## 2020-01-29 NOTE — ED Provider Notes (Signed)
[facility] Provider Note   CSN: [id number] Arrival date & time: 01/29/20  2319   History Chief Complaint  Patient presents with  . Fall    Leah Velasquez is a 79 y.o. female.  The history is provided by the patient and the EMS personnel.  Fall  She was brought in by EMS as a level 2 trauma because of a ground-level fall and patient who is anticoagulated.  She has history of atrial fibrillation and is anticoagulated on warfarin.  She was reaching to turn out a light and lost her balance and fell striking her lower back against a door and hitting her head on the floor.  She is complaining of pain in her lower back and in her right shoulder.  There was no loss of consciousness.  She denies dizziness or lightheadedness.  She does have history of gout and is having pain in both legs because of gout.  EMS noted that she was unable to stand but it is not certain whether this is from dizziness or from leg pain from the gout.  No past medical history on file.  There are no problems to display for this patient.   ** The histories are not reviewed yet. Please review them in the "History" navigator section and refresh this [place].   OB History   No obstetric history on file.     No family history on file.  Social History   Tobacco Use  . Smoking status: Not on file  Substance Use Topics  . Alcohol use: Not on file  . Drug use: Not on file    Home Medications Prior to Admission medications   Not on File    Allergies    Patient has no allergy information on record.  Review of Systems   Review of Systems  All other systems reviewed and are negative.   Physical Exam Updated Vital Signs BP 138/80   Pulse 80   Temp 97.7 F (36.5 C) (Temporal)   Resp (!) 21   SpO2 98%   Physical Exam Vitals and nursing note reviewed.   79 year old female, resting comfortably and in no acute distress. Vital signs are normal. Oxygen saturation is  98%, which is normal. Head is normocephalic and atraumatic. PERRLA, EOMI. Oropharynx is clear. Neck is nontender without adenopathy or JVD. Back is mildly tender in the mid and lower lumbar area.  There is no CVA tenderness. Lungs are clear without rales, wheezes, or rhonchi. Chest is nontender. Heart has regular rate and rhythm with 2/6 systolic ejection murmur heard along the left sternal border. Abdomen is soft, flat, nontender without masses or hepatosplenomegaly and peristalsis is normoactive. Extremities have 1+ edema, full range of motion is present.  There is tenderness palpation over the right humeral head. Skin is warm and dry without rash. Neurologic: Mental status is normal, cranial nerves are intact, there are no motor or sensory deficits.  ED Results / Procedures / Treatments   Labs (all labs ordered are listed, but only abnormal results are displayed) Labs Reviewed  PROTIME-INR - Abnormal; Notable for the following components:      Result Value   Prothrombin Time 37.0 (*)    INR 3.9 (*)    All other components within normal limits   Radiology DG Lumbar Spine Complete  Result Date: 01/30/2020 CLINICAL DATA:  Pain EXAM: LUMBAR SPINE - COMPLETE 4+ VIEW COMPARISON:  None. FINDINGS: Examination is significantly limited by patient body  habitus. There is no definite acute displaced fracture. No definite malalignment. Multilevel degenerative changes are noted throughout the lumbar spine. Advanced vascular calcifications are noted. IMPRESSION: 1. Very limited study secondary to patient body habitus. 2. No definite acute displaced fracture identified on this study. Electronically Signed   By: Constance Holster M.D.   On: 01/30/2020 00:22   DG Shoulder Right  Result Date: 01/30/2020 CLINICAL DATA:  Pain EXAM: RIGHT SHOULDER - 2+ VIEW COMPARISON:  None. FINDINGS: There is no acute displaced fracture or dislocation. Moderate degenerative changes are noted of the right glenohumeral  joint. There are multiple old healed right-sided rib fractures. There are degenerative changes of the right AC joint. IMPRESSION: 1. No acute displaced fracture or dislocation. 2. Moderate degenerative changes of the right glenohumeral joint. Electronically Signed   By: Constance Holster M.D.   On: 01/30/2020 00:22   CT Head Wo Contrast  Result Date: 01/29/2020 CLINICAL DATA:  Status post fall. EXAM: CT HEAD WITHOUT CONTRAST TECHNIQUE: Contiguous axial images were obtained from the base of the skull through the vertex without intravenous contrast. COMPARISON:  None. FINDINGS: Brain: There is moderate severity cerebral atrophy with widening of the extra-axial spaces and ventricular dilatation. Asymmetric sulcal prominence is noted throughout the left hemisphere. There are areas of decreased attenuation within the white matter tracts of the supratentorial brain, consistent with microvascular disease changes. 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 Vascular: No hyperdense vessel or unexpected calcification. Skull: Normal. Negative for fracture or focal lesion. Sinuses/Orbits: No acute finding. Other: None. IMPRESSION: 1. Generalized cerebral atrophy. 2. 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 3. No acute intracranial abnormality. Electronically Signed   By: Virgina Norfolk M.D.   On: 01/29/2020 23:51   CT Cervical Spine Wo Contrast  Result Date: 01/29/2020 CLINICAL DATA:  Status post fall. EXAM: CT CERVICAL SPINE WITHOUT CONTRAST TECHNIQUE: Multidetector CT imaging of the cervical spine was performed without intravenous contrast. Multiplanar CT image reconstructions were also generated. COMPARISON:  None. FINDINGS: Alignment: There is approximately 2 mm anterolisthesis of the C4 vertebral body on C5. Skull base and vertebrae: No acute fracture. No primary bone lesion or focal pathologic process. Soft tissues and spinal canal: No prevertebral fluid or swelling. No  visible canal hematoma. Disc levels: Moderate to marked severity endplate sclerosis is seen at the levels of C5-C6 and C6-C7. Marked severity intervertebral disc space narrowing is seen at the level of C5-C6 with moderate severity intervertebral disc space narrowing seen at the level of C6-C7. Multilevel moderate to marked severity bilateral facet joint hypertrophy is seen. Upper chest: A 2.7 cm x 2.1 cm x 2.5 cm low-attenuation soft tissue mass is seen within the medial aspect of the right apex. Other: None. IMPRESSION: 1. No acute fracture within the cervical spine. 2. Marked severity degenerative changes at the levels of C5-C6 and C6-C7. 3. A 2.7 cm x 2.1 cm x 2.5 cm low-attenuation right apical mass. This finding is concerning for the presence of a pulmonary neoplasm. Further evaluation with a nonemergent chest CT is recommended. Electronically Signed   By: Virgina Norfolk M.D.   On: 01/29/2020 23:59    Procedures Procedures  CRITICAL CARE Performed by: Delora Fuel Total critical care time: 35 minutes Critical care time was exclusive of separately billable procedures and treating other patients. Critical care was necessary to treat or prevent imminent or life-threatening deterioration. Critical care was time spent personally by me on the following activities: development of treatment plan with  patient and/or surrogate as well as nursing, discussions with consultants, evaluation of patient's response to treatment, examination of patient, obtaining history from patient or surrogate, ordering and performing treatments and interventions, ordering and review of laboratory studies, ordering and review of radiographic studies, pulse oximetry and re-evaluation of patient's condition.  Medications Ordered in ED Medications - No data to display  ED Course  I have reviewed the triage vital signs and the nursing notes.  Pertinent labs & imaging results that were available during my care of the patient  were reviewed by me and considered in my medical decision making (see chart for details).  MDM Rules/Calculators/A&P Minor head trauma and patient anticoagulated on warfarin.  She will be sent for CT of head and will check INR.  X-rays will be obtained of right shoulder and lumbosacral spine.  CT scans and x-rays showed no fractures, but incidental finding of right upper lobe lung mass identified.  Patient advised to obtain outpatient CT of chest on an elective basis.  Advised to apply ice, take acetaminophen for pain.  INR is high, advised to follow-up with her Coumadin clinic for dose adjustment recommendations.  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

## 2020-01-29 NOTE — ED Notes (Signed)
Patient transported to CT 

## 2020-01-29 NOTE — Progress Notes (Signed)
Orthopedic Tech Progress Note Patient Details:  Leah Velasquez 03/11/1941 [id number] Level 2 Trauma  Patient ID: Leah Velasquez, female   DOB: [birth year], 79 y.o.   MRN: [id number]   [name] 01/29/2020, 11:54 PM

## 2020-01-29 NOTE — Progress Notes (Signed)
Chaplain responded to this Level II Trauma fall.  Patient being assessed Chaplain connected with the patient who shared that her husband stayed home due to his recent release from the hospital a few days ago. Patient is anxious to get back home to her husband but knows he has family to call if he needs anything.  Chaplain available for further support as needed. [place], MDiv.    01/29/20 2329  Clinical Encounter Type  Visited With Patient;Health care provider  Visit Type Trauma  Referral From Nurse  Consult/Referral To [facility] (For Healthcare)  Does Patient Have a Medical Advance Directive? No  Would patient like information on creating a medical advance directive? Yes (ED - Information included in AVS)  Mental Health Advance Directives  Does Patient Have a Mental Health Advance Directive? No  Would patient like information on creating a mental health advance directive? Yes (ED - Information included in AVS)

## 2020-01-30 ENCOUNTER — Ambulatory Visit (INDEPENDENT_AMBULATORY_CARE_PROVIDER_SITE_OTHER): Payer: Medicare HMO | Admitting: Pharmacist Clinician (PhC)/ Clinical Pharmacy Specialist

## 2020-01-30 ENCOUNTER — Telehealth: Payer: Self-pay | Admitting: Pharmacist Clinician (PhC)/ Clinical Pharmacy Specialist

## 2020-01-30 DIAGNOSIS — I4891 Unspecified atrial fibrillation: Secondary | ICD-10-CM

## 2020-01-30 DIAGNOSIS — Z7901 Long term (current) use of anticoagulants: Secondary | ICD-10-CM | POA: Diagnosis not present

## 2020-01-30 LAB — PROTIME-INR
INR: 3.9 — ABNORMAL HIGH (ref 0.8–1.2)
Prothrombin Time: 37 seconds — ABNORMAL HIGH (ref 11.4–15.2)

## 2020-01-30 NOTE — Discharge Instructions (Signed)
Apply ice to the sore areas.  Take acetaminophen as needed for pain.  Talk with the clinic that manages your Coumadin to see if they wish to adjust your dose.  Your INR today was 3.9, and the usual target is 2.0-3.0.  Your CT scan showed a mass in the right upper lung.  Please have your primary care provider arrange for an outpatient CT scan of your chest to further evaluate it.

## 2020-01-30 NOTE — ED Notes (Signed)
Pt uses walker to ambulate at home, she ambulated to door with walker.

## 2020-01-30 NOTE — Telephone Encounter (Signed)
Patient called to report that she had a fall yesterday and ended up calling EMS and going to ED.  Workup was negative for bleeding on the brain or broken bones.  INR reported by ED at 3.9.  (pt thought it was 9.0)  Patient last INR was elevated at 5.2 on 5/14 and she was asked to follow up on 5/21.  This did not occur.    Advised patient today to restart warfarin (held dose last night) tonight and decrease dose to 2.5 mg daily except 5 mg each Tuesday and Saturday.  Stressed need for her to go to [facility] next week on Tuesday or Wednesday for repeat.  Also stressed that if she does not hear from [place] within 48 hours of draw, she needs to call us.   Patient voiced understanding.

## 2020-01-30 NOTE — ED Notes (Signed)
[facility] was notified for transport.

## 2020-02-07 ENCOUNTER — Emergency Department (HOSPITAL_COMMUNITY)
Admission: EM | Admit: 2020-02-07 | Discharge: 2020-02-08 | Disposition: A | Discharge status: Home / Self Care | Payer: Medicare HMO | Attending: Emergency Medicine | Admitting: Emergency Medicine

## 2020-02-07 ENCOUNTER — Emergency Department (HOSPITAL_COMMUNITY): Payer: Medicare HMO

## 2020-02-07 ENCOUNTER — Encounter (HOSPITAL_COMMUNITY): Payer: Self-pay

## 2020-02-07 DIAGNOSIS — R791 Abnormal coagulation profile: Secondary | ICD-10-CM | POA: Diagnosis not present

## 2020-02-07 DIAGNOSIS — I4891 Unspecified atrial fibrillation: Secondary | ICD-10-CM | POA: Diagnosis not present

## 2020-02-07 DIAGNOSIS — Y999 Unspecified external cause status: Secondary | ICD-10-CM | POA: Diagnosis not present

## 2020-02-07 DIAGNOSIS — Y92013 Bedroom of single-family (private) house as the place of occurrence of the external cause: Secondary | ICD-10-CM | POA: Diagnosis not present

## 2020-02-07 DIAGNOSIS — Y9301 Activity, walking, marching and hiking: Secondary | ICD-10-CM | POA: Insufficient documentation

## 2020-02-07 DIAGNOSIS — I10 Essential (primary) hypertension: Secondary | ICD-10-CM | POA: Diagnosis not present

## 2020-02-07 DIAGNOSIS — R102 Pelvic and perineal pain: Secondary | ICD-10-CM | POA: Diagnosis not present

## 2020-02-07 DIAGNOSIS — Z79899 Other long term (current) drug therapy: Secondary | ICD-10-CM | POA: Insufficient documentation

## 2020-02-07 DIAGNOSIS — R519 Headache, unspecified: Secondary | ICD-10-CM | POA: Diagnosis not present

## 2020-02-07 DIAGNOSIS — Z7901 Long term (current) use of anticoagulants: Secondary | ICD-10-CM | POA: Insufficient documentation

## 2020-02-07 DIAGNOSIS — M545 Low back pain: Secondary | ICD-10-CM | POA: Diagnosis present

## 2020-02-07 DIAGNOSIS — W010XXA Fall on same level from slipping, tripping and stumbling without subsequent striking against object, initial encounter: Secondary | ICD-10-CM | POA: Diagnosis not present

## 2020-02-07 DIAGNOSIS — W19XXXA Unspecified fall, initial encounter: Secondary | ICD-10-CM

## 2020-02-07 HISTORY — DX: Unspecified atrial fibrillation: I48.91

## 2020-02-07 LAB — CBC
HCT: 45.7 % (ref 36.0–46.0)
Hemoglobin: 14.7 g/dL (ref 12.0–15.0)
MCH: 31.3 pg (ref 26.0–34.0)
MCHC: 32.2 g/dL (ref 30.0–36.0)
MCV: 97.2 fL (ref 80.0–100.0)
Platelets: 199 10*3/uL (ref 150–400)
RBC: 4.7 MIL/uL (ref 3.87–5.11)
RDW: 14.2 % (ref 11.5–15.5)
WBC: 7.5 10*3/uL (ref 4.0–10.5)
nRBC: 0 % (ref 0.0–0.2)

## 2020-02-07 LAB — BASIC METABOLIC PANEL
Anion gap: 11 (ref 5–15)
BUN: 18 mg/dL (ref 8–23)
CO2: 23 mmol/L (ref 22–32)
Calcium: 9 mg/dL (ref 8.9–10.3)
Chloride: 103 mmol/L (ref 98–111)
Creatinine, Ser: 0.87 mg/dL (ref 0.44–1.00)
GFR calc Af Amer: >60 mL/min (ref >60)
GFR calc non Af Amer: >60 mL/min (ref >60)
Glucose, Bld: 92 mg/dL (ref 70–99)
Potassium: 4.2 mmol/L (ref 3.5–5.1)
Sodium: 137 mmol/L (ref 135–145)

## 2020-02-07 IMAGING — CT CT HEAD W/O CM
4 series · 15 of 47 positions shown, 17 images · non-contrast
Comparison: Head CT 9 days ago [DATE]

CLINICAL DATA: Fall getting up to go to the bathroom. On
anticoagulation. Head trauma, minor (Age >= 65y)

EXAM:
CT HEAD WITHOUT CONTRAST
TECHNIQUE: Contiguous axial images were obtained from the base of the skull
through the vertex without intravenous contrast.

[Series 3: head without · axial · non-contrast · 0.39mm/px · z∈[-191,-66]mm · 7 of 35 slices shown, 9 images]
[im 5/35  brain]
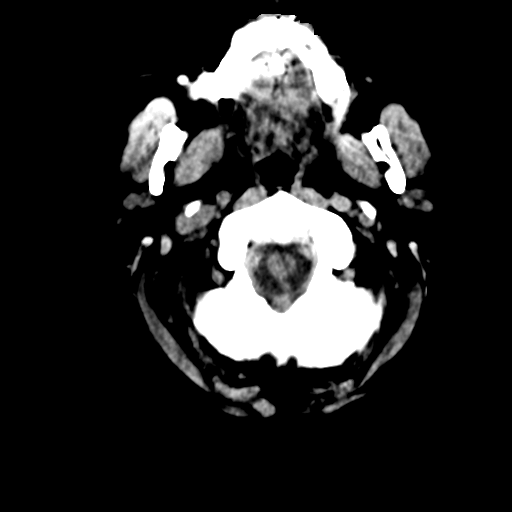
[im 5/35  bone]
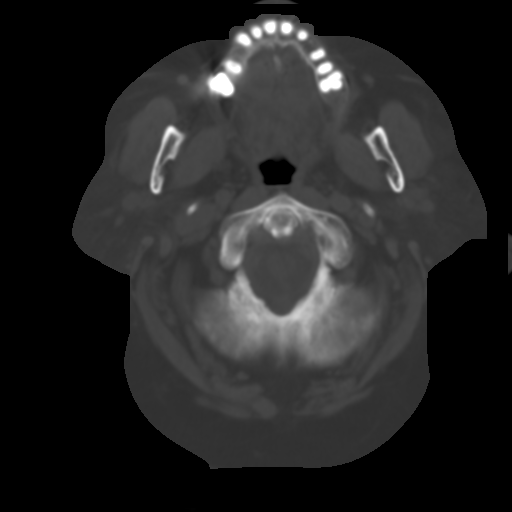
[im 9/35  brain]
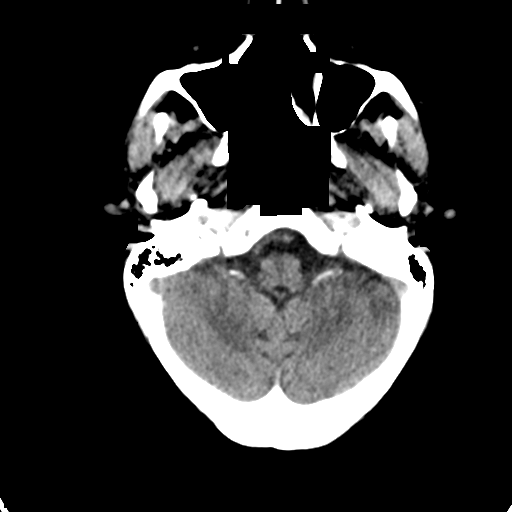
[im 13/35  brain]
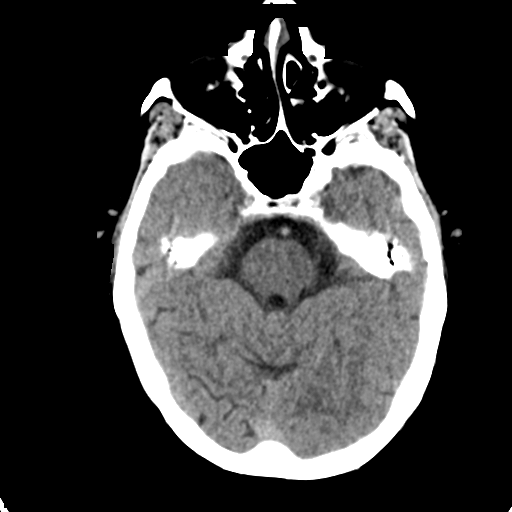
[im 18/35  brain]
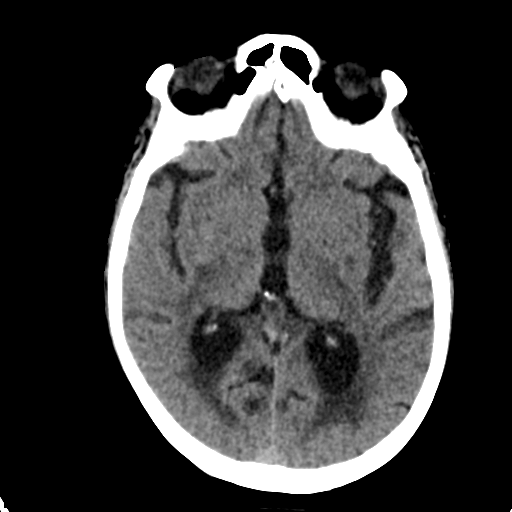
[im 22/35  brain]
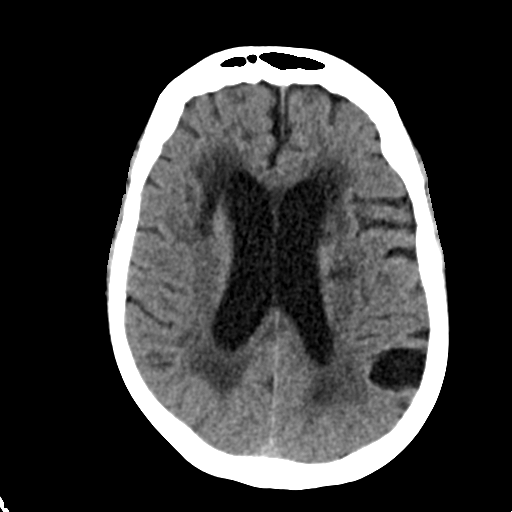
[im 22/35  bone]
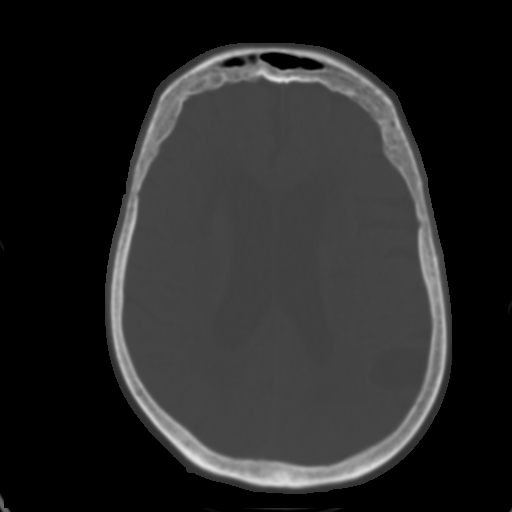
[im 26/35  brain]
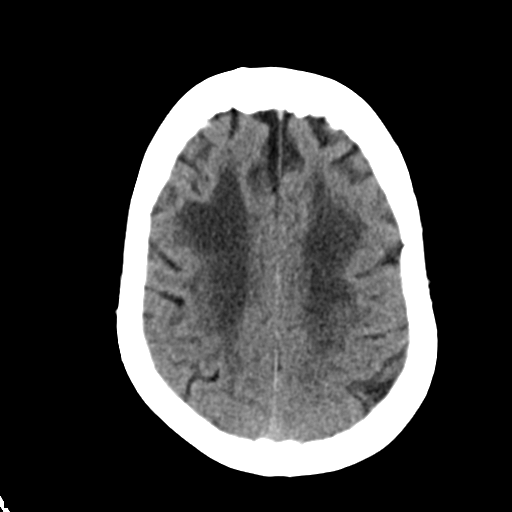
[im 30/35  brain]
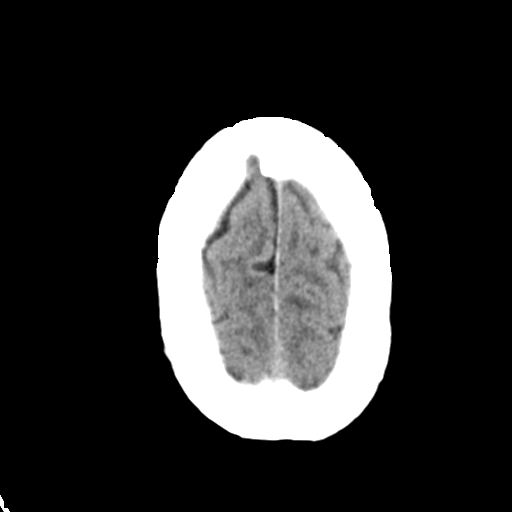

[Series 4: head bone · axial · 0.39mm/px · z∈[-195,-177]mm · 2 of 86 slices shown]
[im 9/86  bone]
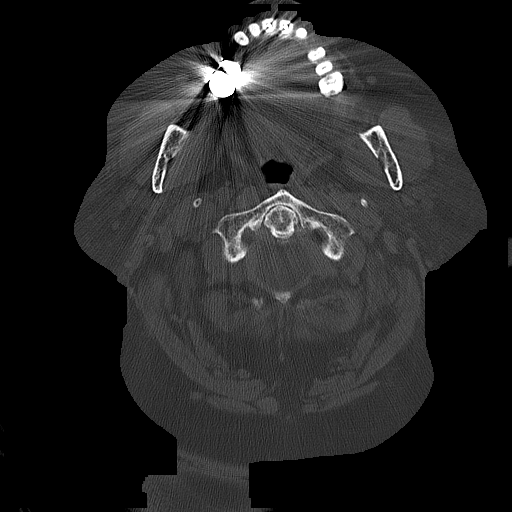
[im 18/86  bone]
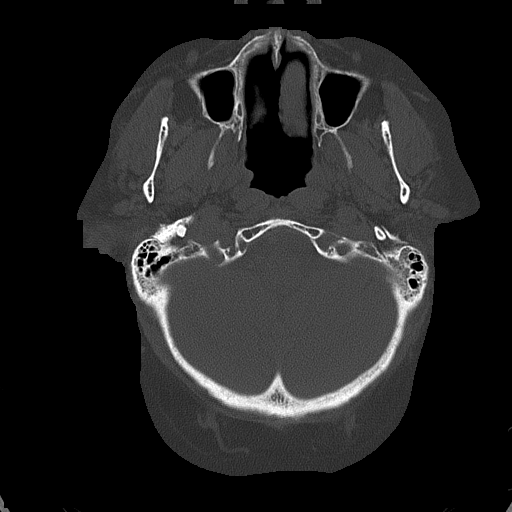

[Series 5: head without cor · coronal · non-contrast · 0.47mm/px · 3 of 70 slices shown]
[im 24/70  brain]
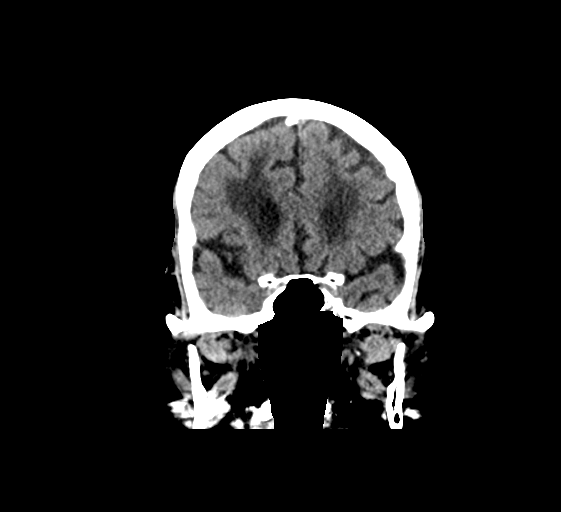
[im 31/70  brain]
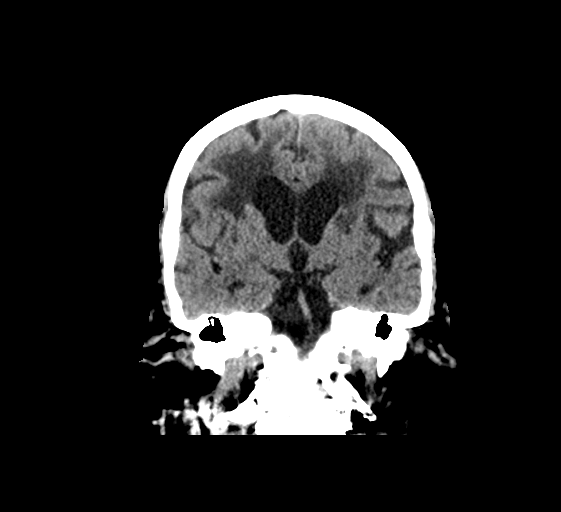
[im 39/70  brain]
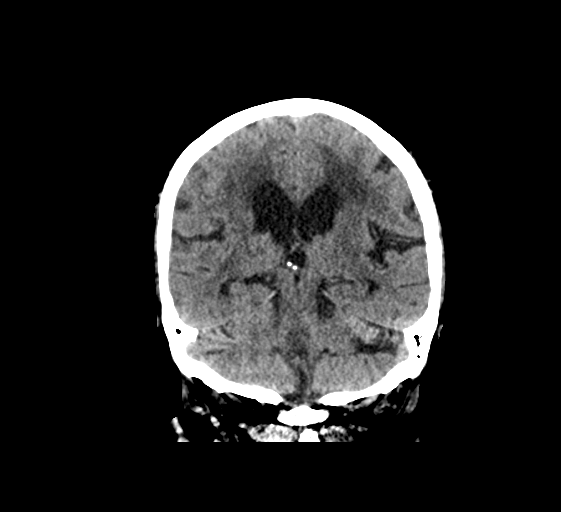

[Series 6: head without sag · sagittal · non-contrast · 0.38mm/px · 3 of 67 slices shown]
[im 23/67  brain]
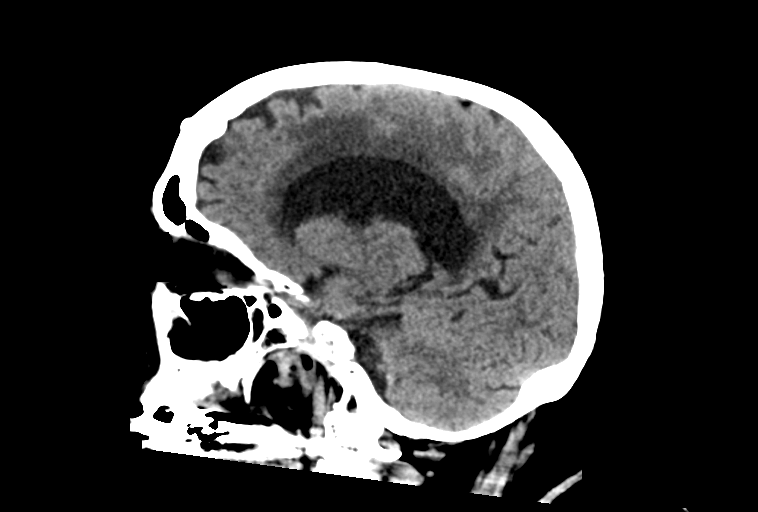
[im 34/67  brain]
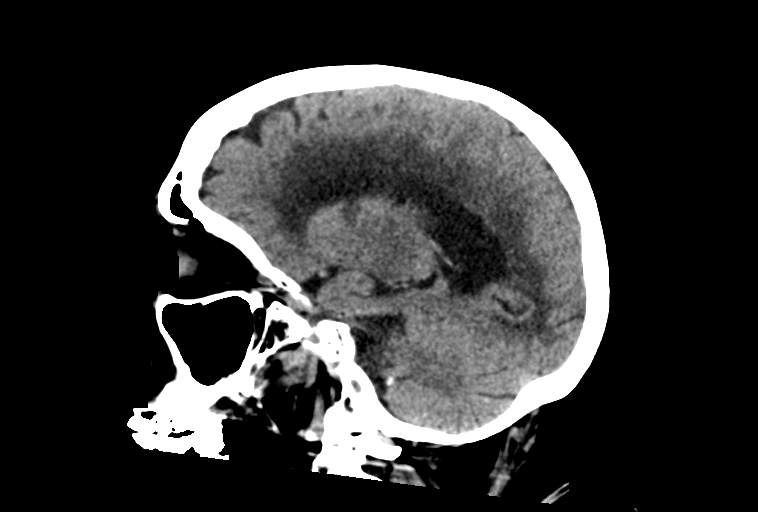
[im 45/67  brain]
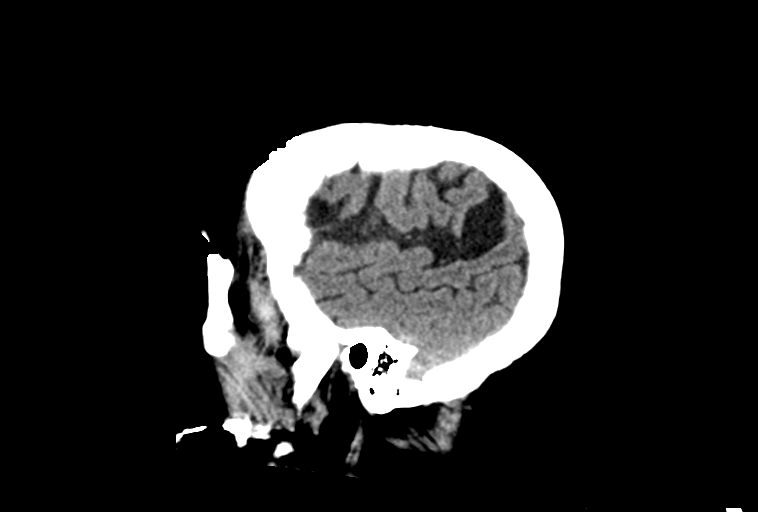

[15 of 47 positions shown; findings below may reference images not displayed]

FINDINGS: Brain: Generalized atrophy and chronic small vessel ischemia, stable
from prior exam. Asymmetric sulcal prominence throughout the left
hemisphere is unchanged. No acute hemorrhage or subdural collection.
No midline shift or mass effect. Remote lacunar infarcts in the
basal ganglia. No evidence of acute ischemia.

Vascular: Atherosclerosis of skullbase vasculature without
hyperdense vessel or abnormal calcification.

Skull: No fracture or focal lesion. Tiny cortical outpouching in the
right frontal bone is unchanged, benign.

Sinuses/Orbits: Minor mucosal thickening of ethmoid air cells.
Bilateral cataract resection. No acute findings.

Other: None.
IMPRESSION: 1. No acute intracranial abnormality. No skull fracture.
2. Stable atrophy and chronic small vessel ischemia.

## 2020-02-07 IMAGING — CT CT PELVIS W/O CM
2 of 3 series · 16 of 46 positions shown, 18 images · non-contrast
Comparison: None.

CLINICAL DATA: Tailbone pain, status post fall

EXAM:
CT PELVIS WITHOUT CONTRAST
TECHNIQUE: Multidetector CT imaging of the pelvis was performed following the
standard protocol without intravenous contrast.

[Series 5: pelvis 2.0 st · axial · 0.83mm/px · z∈[-920,-704]mm · 13 of 125 slices shown, 15 images]
[im 9/125  soft-tissue]
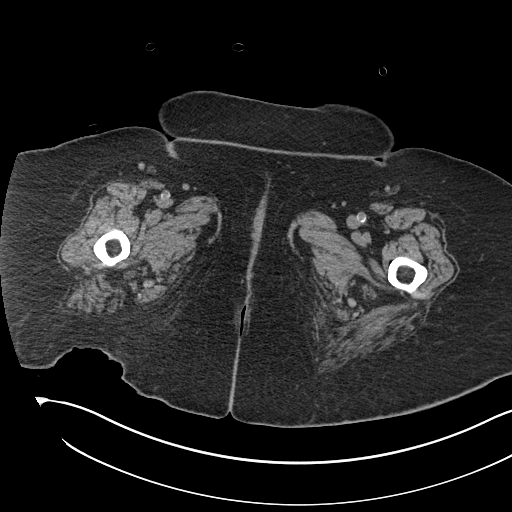
[im 9/125  bone]
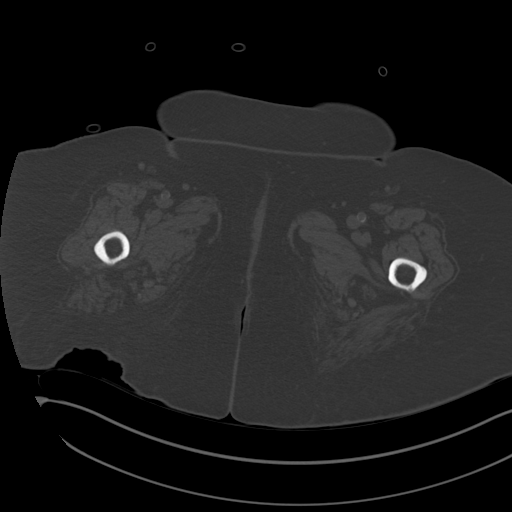
[im 17/125  soft-tissue]
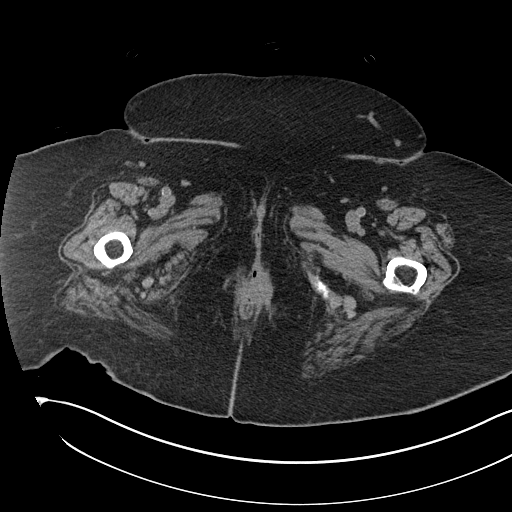
[im 25/125  soft-tissue]
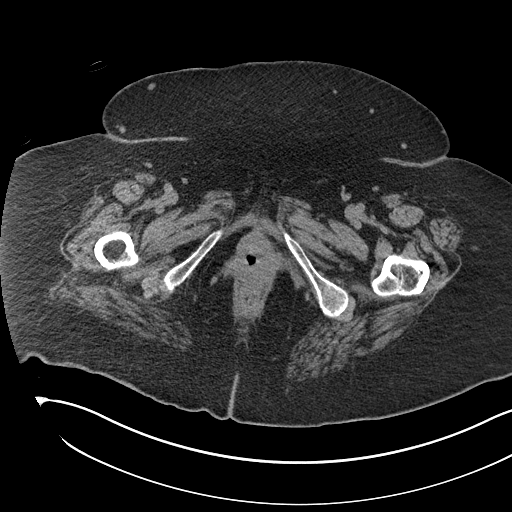
[im 37/125  soft-tissue]
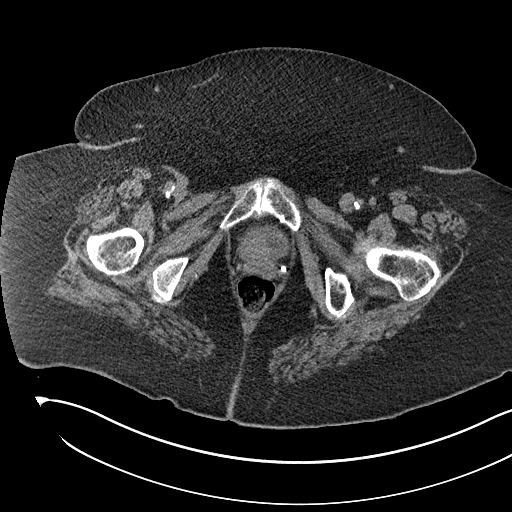
[im 45/125  soft-tissue]
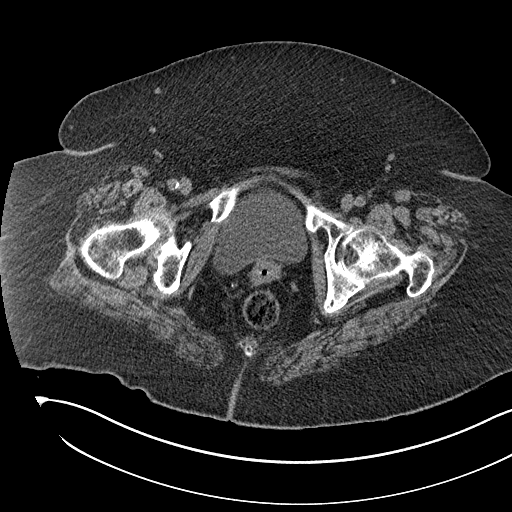
[im 53/125  soft-tissue]
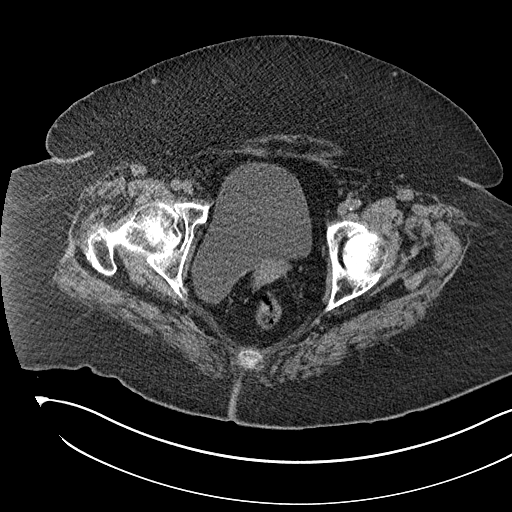
[im 65/125  soft-tissue]
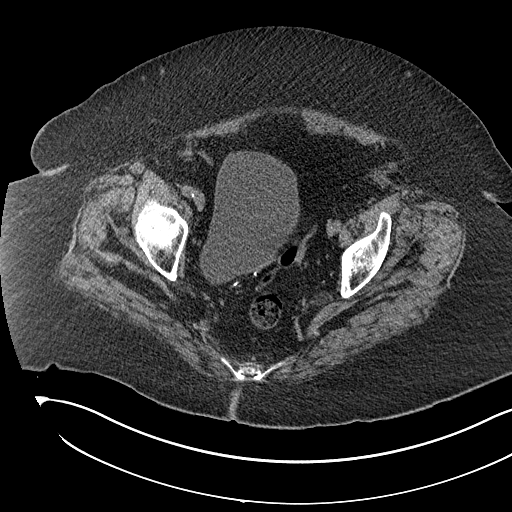
[im 73/125  soft-tissue]
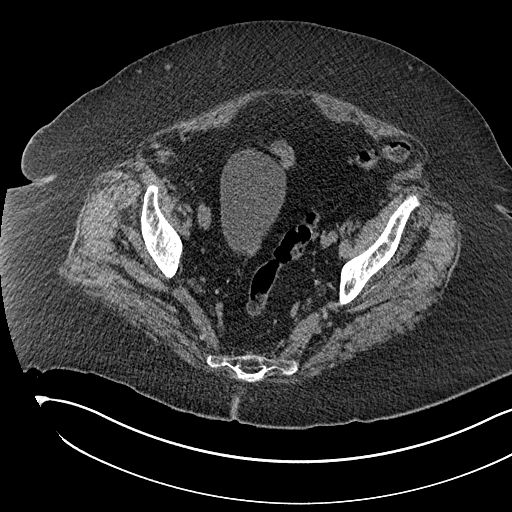
[im 81/125  soft-tissue]
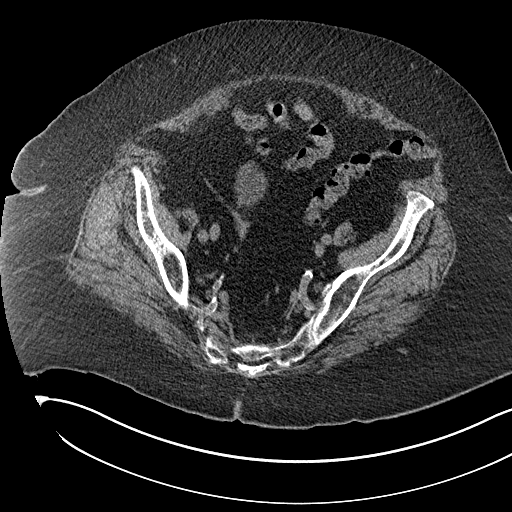
[im 81/125  bone]
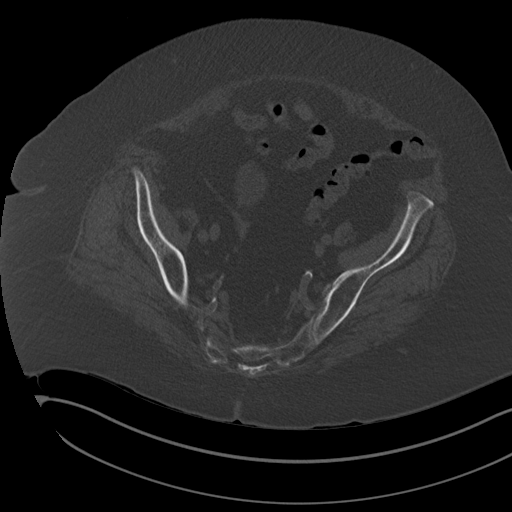
[im 89/125  soft-tissue]
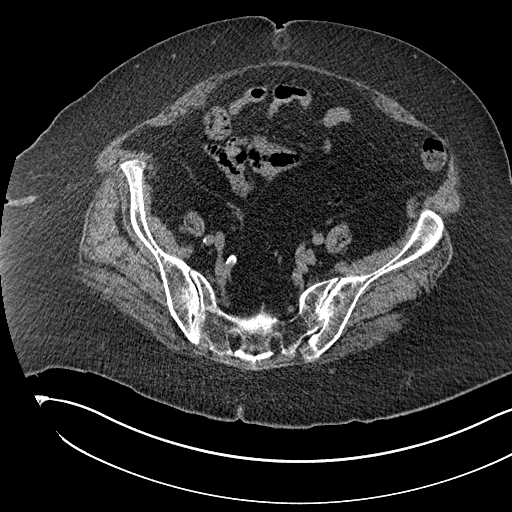
[im 101/125  soft-tissue]
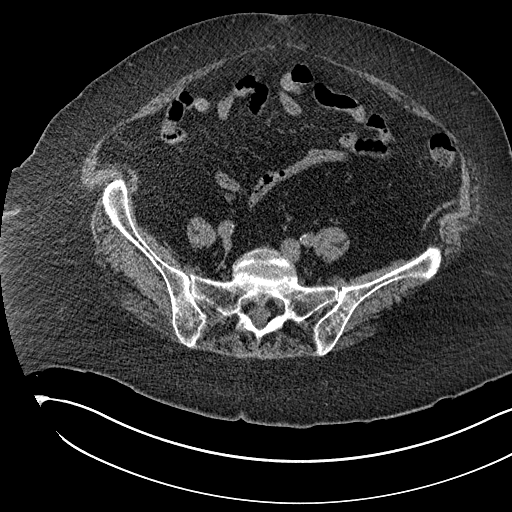
[im 109/125  soft-tissue]
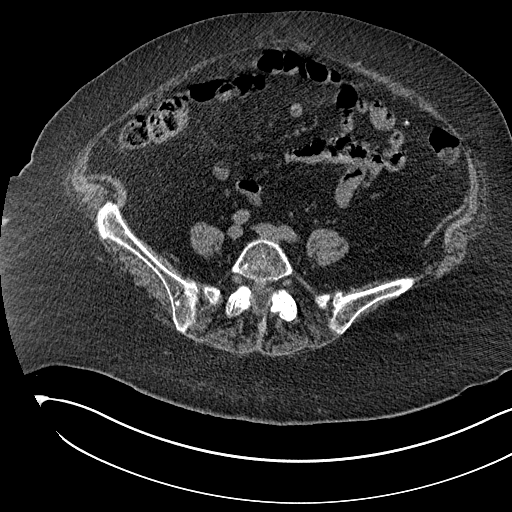
[im 117/125  soft-tissue]
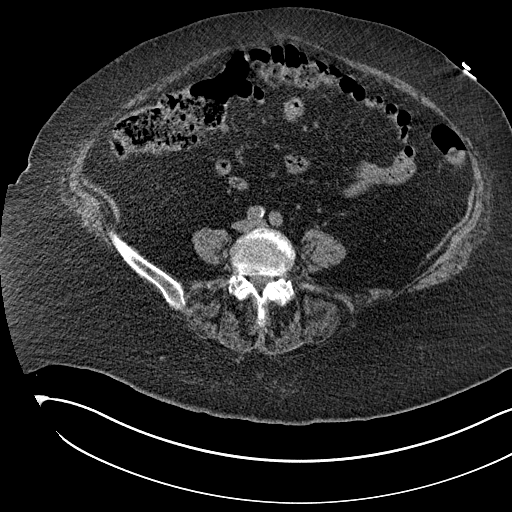

[Series 6: pelvis 2.0 cor. bone · coronal · 0.49mm/px · 3 of 167 slices shown]
[im 56/167  soft-tissue]
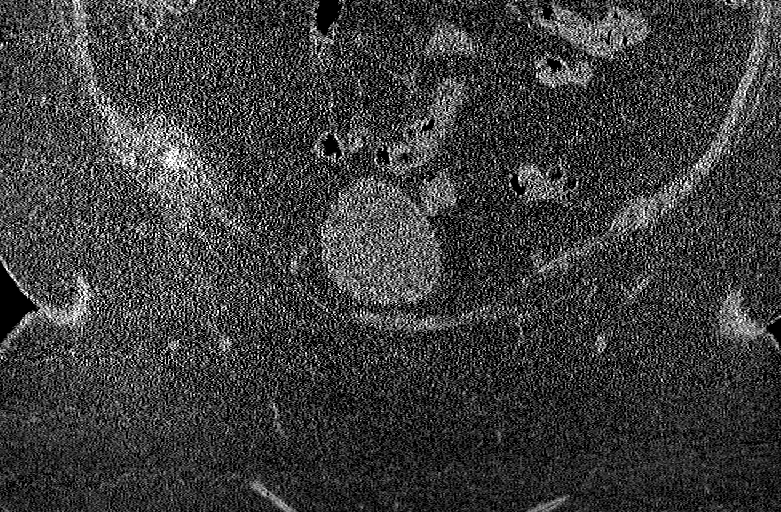
[im 74/167  soft-tissue]
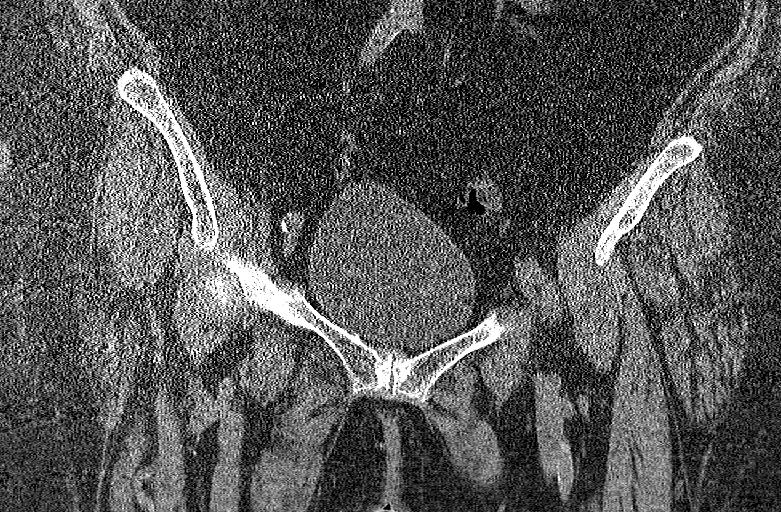
[im 93/167  soft-tissue]
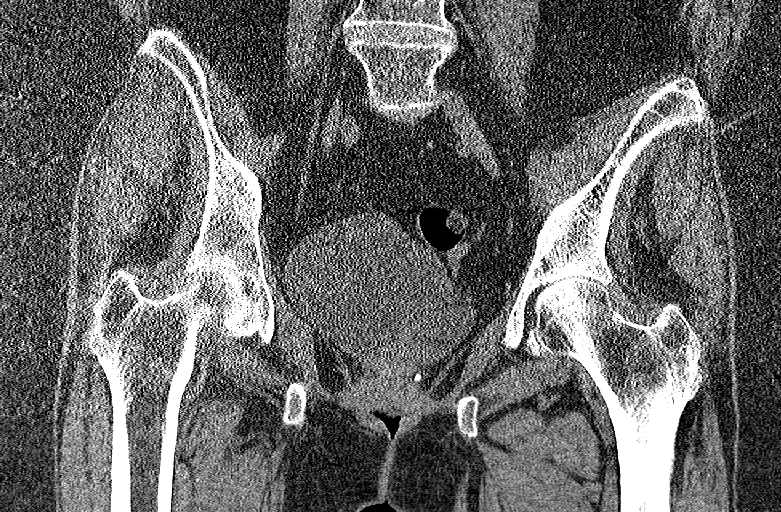

[16 of 46 positions shown; findings below may reference images not displayed]

FINDINGS: Urinary Tract: The visualized distal ureters and bladder appear
unremarkable.

Bowel: No bowel wall thickening, distention or surrounding
inflammation identified within the pelvis.

Vascular/Lymphatic: No enlarged pelvic lymph nodes identified.
Scattered aortic atherosclerotic calcifications are seen without
aneurysmal dilatation.

Reproductive: The patient is status post hysterectomy. No adnexal
masses or collections seen.

Other: No focal soft tissue swelling or soft tissue mass.

Musculoskeletal: No acute or significant osseous findings. There is
mild fatty atrophy of the muscles surrounding the pelvis. No large
hip joint effusions. There is diffuse osteopenia. Moderate bilateral
hip osteoarthritis is seen with joint space loss and marginal
osteophyte formation.
IMPRESSION: No acute fracture or dislocation.

Moderate bilateral hip osteoarthritis.

Aortic Atherosclerosis ([ZB]-[ZB]).

## 2020-02-07 MED ORDER — ACETAMINOPHEN 500 MG PO TABS
1000.0000 mg | ORAL_TABLET | Freq: Once | ORAL | Status: AC
  Administered 2020-02-07: 1000 mg via ORAL
  Filled 2020-02-07: qty 2

## 2020-02-07 NOTE — Progress Notes (Signed)
Orthopedic Tech Progress Note Patient Details:  Leah Velasquez [birth year] [id number] Level 2 Trauma  Patient ID: Leah Velasquez, female   DOB: [birth year], 79 y.o.   MRN: [id number]   Leah Velasquez 02/07/2020, 10:52 PM

## 2020-02-07 NOTE — ED Notes (Addendum)
Pt comes via [facility] after a fall when getting up to go to the bathroom from standing height, on coumadin, no LOC, did not hit head, c/o of sacral pain

## 2020-02-07 NOTE — ED Provider Notes (Signed)
[facility] DEPART[facility] Provider Note   CSN: [id number] Arrival date & time: 02/07/20  2242     History Chief Complaint  Patient presents with  . Fall    Leah Velasquez is a 79 y.o. female presenting for evaluation after a fall.  Patient states she was getting out of the bed to go to the bathroom when she slipped on the carpet next to her bed, and fell landing on her buttocks.  Since then, she has had severe tailbone pain.  She had a similar fall last week, and injured her tailbone and then but did not have any fractures.  Last week is when she hit her head, but she did not lose consciousness.  She was seen in the ED at that time and had imaging.  She denies hitting her head or loss of consciousness tonight.  She has not attempted ambulation.  She denies neck, back, chest, or abdominal pain.  She is on warfarin for A. fib.  She denies numbness or tingling.  No radiation of pain.  She has not taken anything or received anything for pain.  HPI     Past Medical History:  Diagnosis Date  . A-fib ([place])   . Hypertension     There are no problems to display for this patient.   Past Surgical History:  Procedure Laterality Date  . KNEE SURGERY       OB History   No obstetric history on file.     No family history on file.  Social History   Tobacco Use  . Smoking status: Not on file  Substance Use Topics  . Alcohol use: Not on file  . Drug use: Not on file    Home Medications Prior to Admission medications   Medication Sig Start Date End Date Taking? Authorizing Provider  albuterol (VENTOLIN HFA) 108 (90 Base) MCG/ACT inhaler Inhale 1-2 puffs into the lungs every 6 (six) hours as needed for wheezing or shortness of breath.   Yes [provider]  calcium carbonate (TUMS EX) 750 MG chewable tablet Chew 1 tablet by mouth daily as needed for heartburn.   Yes [provider]  Cholecalciferol (D3-1000) 25 MCG (1000 UT) tablet Take  1,000 Units by mouth daily.   Yes [provider]  conjugated estrogens (PREMARIN) vaginal cream Place 1 Applicatorful vaginally 2 (two) times a week.   Yes [provider]  Esomeprazole Magnesium (NEXIUM PO) Take 1 capsule by mouth daily as needed (Indigestion).   Yes [provider]  fluticasone (FLONASE) 50 MCG/ACT nasal spray Place 1 spray into both nostrils daily as needed for allergies or rhinitis.   Yes [provider]  furosemide (LASIX) 40 MG tablet Take 40 mg by mouth.   Yes [provider]  leflunomide (ARAVA) 20 MG tablet Take 20 mg by mouth daily.   Yes [provider]  levocetirizine (XYZAL) 5 MG tablet Take 5 mg by mouth daily.   Yes [provider]  losartan (COZAAR) 50 MG tablet Take 25-50 mg by mouth See admin instructions. Take 50mg  in the morning and 25mg  in the evening.   Yes [provider]  metoprolol succinate (TOPROL-XL) 25 MG 24 hr tablet Take 12.5 mg by mouth daily.    Yes [provider]  predniSONE (STERAPRED UNI-PAK 48 TAB) 5 MG (48) TBPK tablet Take 5 mg by mouth as directed. 01/29/20  Yes [provider]  simvastatin (ZOCOR) 20 MG tablet Take 20 mg  by mouth at bedtime.   Yes [provider]  timolol (TIMOPTIC) 0.5 % ophthalmic solution Place 1 drop into both eyes in the morning and at bedtime. 11/03/19  Yes [provider]  traMADol (ULTRAM) 50 MG tablet Take 50 mg by mouth every 6 (six) hours as needed for moderate pain.   Yes [provider]  warfarin (COUMADIN) 5 MG tablet Take 2.5-5 mg by mouth See admin instructions. Take 5mg  on Tuesday and Saturday and take 2.5mg  on all other days.   Yes [provider]  zolpidem (AMBIEN) 5 MG tablet Take 2.5 mg by mouth at bedtime as needed for sleep.   Yes [provider]  lidocaine (LIDODERM) 5 % Place 1 patch onto the skin daily. Remove & Discard patch within 12 hours or as directed by MD 02/08/20    Seward Coran, PA-C    Allergies    Morphine and related and Sulfa antibiotics  Review of Systems   Review of Systems  Musculoskeletal:       Tailbone pain  Neurological: Positive for headaches (residual occipital head pain s/p fall from last week).  Hematological: Bruises/bleeds easily.  All other systems reviewed and are negative.   Physical Exam Updated Vital Signs BP (!) 147/87   Pulse 79   Temp 98.2 F (36.8 C) (Oral)   Resp (!) 21   Ht 5\' 1"  (1.549 m)   Wt 99.8 kg   SpO2 94%   BMI 41.57 kg/m   Physical Exam Vitals and nursing note reviewed.  Constitutional:      General: She is not in acute distress.    Appearance: She is well-developed.  HENT:     Head: Normocephalic and atraumatic.     Comments: No obvious head trauma.  No tenderness palpation Eyes:     Conjunctiva/sclera: Conjunctivae normal.     Pupils: Pupils are equal, round, and reactive to light.  Neck:     Comments: No tenderness palpation midline c-spine.  Moving head in all directions. Cardiovascular:     Rate and Rhythm: Normal rate and regular rhythm.     Pulses: Normal pulses.  Pulmonary:     Effort: Pulmonary effort is normal. No respiratory distress.     Breath sounds: Normal breath sounds. No wheezing.     Comments: Clear lung sounds Abdominal:     General: There is no distension.     Palpations: Abdomen is soft. There is no mass.     Tenderness: There is no abdominal tenderness. There is no guarding or rebound.  Musculoskeletal:        General: Normal range of motion.     Cervical back: Normal range of motion and neck supple.     Comments: No obvious deformity of any extremity.  Radial and pedal pulses 2+ bilaterally. Tenderness palpation of tailbone and buttocks without obvious deformity.  Strength of bilateral lower extremities equal.  No saddle paresthesia.  Good distal sensation and cap refill  Skin:    General: Skin is warm and dry.     Capillary Refill: Capillary refill  takes less than 2 seconds.  Neurological:     Mental Status: She is alert and oriented to person, place, and time.     ED Results / Procedures / Treatments   Labs (all labs ordered are listed, but only abnormal results are displayed) Labs Reviewed  PROTIME-INR - Abnormal; Notable for the following components:      Result Value   Prothrombin Time 41.8 (*)  INR 4.6 (*)    All other components within normal limits  CBC  BASIC METABOLIC PANEL    EKG EKG Interpretation  Date/Time:  Friday February 07 2020 22:53:00 EDT Ventricular Rate:  79 PR Interval:    QRS Duration: 123 QT Interval:  397 QTC Calculation: 456 R Axis:   41 Text Interpretation: Atrial fibrillation Confirmed by Veryl Speak [phone]) on 02/07/2020 11:13:20 PM   Radiology CT Head Wo Contrast  Result Date: 02/07/2020 CLINICAL DATA:  Fall getting up to go to the bathroom. On anticoagulation. Head trauma, minor (Age >= 65y) EXAM: CT HEAD WITHOUT CONTRAST TECHNIQUE: Contiguous axial images were obtained from the base of the skull through the vertex without intravenous contrast. COMPARISON:  Head CT 9 days ago 01/29/2020 FINDINGS: Brain: Generalized atrophy and chronic small vessel ischemia, stable from prior exam. Asymmetric sulcal prominence throughout the left hemisphere is unchanged. No acute hemorrhage or subdural collection. No midline shift or mass effect. 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 No evidence of acute ischemia. Vascular: Atherosclerosis of skullbase vasculature without hyperdense vessel or abnormal calcification. Skull: No fracture or focal lesion. Tiny cortical outpouching in the right frontal bone is unchanged, benign. Sinuses/Orbits: Minor mucosal thickening of ethmoid air cells. Bilateral cataract resection. No acute findings. Other: None. IMPRESSION: 1. No acute intracranial abnormality. No skull fracture. 2. Stable atrophy and chronic small vessel ischemia. Electronically Signed   By: Keith Rake M.D.   On: 02/07/2020 23:37   CT PELVIS WO CONTRAST  Result Date: 02/07/2020 CLINICAL DATA:  Tailbone pain, status post fall EXAM: CT PELVIS WITHOUT CONTRAST TECHNIQUE: Multidetector CT imaging of the pelvis was performed following the standard protocol without intravenous contrast. COMPARISON:  None. FINDINGS: Urinary Tract: The visualized distal ureters and bladder appear unremarkable. Bowel: No bowel wall thickening, distention or surrounding inflammation identified within the pelvis. Vascular/Lymphatic: No enlarged pelvic lymph nodes identified. Scattered aortic atherosclerotic calcifications are seen without aneurysmal dilatation. Reproductive: The patient is status post hysterectomy. No adnexal masses or collections seen. Other: No focal soft tissue swelling or soft tissue mass. Musculoskeletal: No acute or significant osseous findings. There is mild fatty atrophy of the muscles surrounding the pelvis. No large hip joint effusions. There is diffuse osteopenia. Moderate bilateral hip osteoarthritis is seen with joint space loss and marginal osteophyte formation. IMPRESSION: No acute fracture or dislocation. Moderate bilateral hip osteoarthritis. Aortic Atherosclerosis (ICD10-I70.0). Electronically Signed   By: Prudencio Pair M.D.   On: 02/07/2020 23:40    Procedures Procedures (including critical care time)  Medications Ordered in ED Medications  lidocaine (LIDODERM) 5 % 1 patch (1 patch Transdermal Patch Applied 02/08/20 0110)  acetaminophen (TYLENOL) tablet 1,000 mg (1,000 mg Oral Given 02/07/20 2344)    ED Course  I have reviewed the triage vital signs and the nursing notes.  Pertinent labs & imaging results that were available during my care of the patient were reviewed by me and considered in my medical decision making (see chart for details).    MDM Rules/Calculators/A&P                          Patient presenting for evaluation after slipping out of bed and landing on her  buttocks.  On exam, patient appears nontoxic.  No obvious deformity or injury.  She is neurologically intact.  As this is her second fall, she reports residual headache from previous fall, and she is on warfarin, will obtain head CT.  Will obtain pelvic CT to ensure no tailbone injury or pelvic fracture.  CTs without acute findings such as fracture or dislocation.  Labs shows supratherapeutic INR at 4, will have patient follow-up with cardiology for medication management.  Patient ambulated in the ED without difficulty.  As such, plan for discharge.  Patient requesting home health services, will order.  Discussed pain control with Tylenol and lidocaine patches.  At this time, patient appears safe for discharge.  Return precautions given.  Patient states she understands.  Final Clinical Impression(s) / ED Diagnoses Final diagnoses:  Pelvic pain  Fall, initial encounter  Supratherapeutic INR    Rx / DC Orders ED Discharge Orders         Ordered    lidocaine (LIDODERM) 5 %  Every 24 hours     Discontinue  Reprint     02/08/20 [place]  Reprint     02/08/20 0118    Face-to-face encounter (required for Medicare/Medicaid patients)     Discontinue  Reprint    Comments: I Kenosha Doster certify that this patient is under my care and that I, or a nurse practitioner or physician's assistant working with me, had a face-to-face encounter that meets the physician face-to-face encounter requirements with this patient on 02/08/2020. The encounter with the patient was in whole, or in part for the following medical condition(s) which is the primary reason for home health care (List medical condition): frequent falls, back pain   02/08/20 0118           Franchot Heidelberg, PA-C 02/08/20 1840    Veryl Speak, MD 02/09/20 1907

## 2020-02-08 ENCOUNTER — Telehealth: Payer: Self-pay

## 2020-02-08 LAB — PROTIME-INR
INR: 4.6 (ref 0.8–1.2)
Prothrombin Time: 41.8 seconds — ABNORMAL HIGH (ref 11.4–15.2)

## 2020-02-08 MED ORDER — LIDOCAINE 5 % EX PTCH: 1.0000 | MEDICATED_PATCH | CUTANEOUS | 0 refills | Status: DC

## 2020-02-08 MED ORDER — LIDOCAINE 5 % EX PTCH
1.0000 | MEDICATED_PATCH | CUTANEOUS | Status: DC
  Administered 2020-02-08: 1 via TRANSDERMAL
  Filled 2020-02-08: qty 1

## 2020-02-08 NOTE — Discharge Instructions (Addendum)
Call Dr. Gwenlyn Found to discuss your elevated INR.  Use Tylenol and lidocaine patch as needed for pain control. Use heat or ice to help with pain. I recommend you sit on a cushion or donut pillow to help with pain control. Follow-up with your primary care doctor as needed for further evaluation of your pain. Return to the emergency room if you develop loss of bowel bladder control, numbness, tingling, or any new, worsening, or concerning symptoms.

## 2020-02-08 NOTE — Telephone Encounter (Signed)
Called MR Mcmanigal to check on patient and let him know that [facility] would be coming out to provide PT for home health. He agreed that that was good and will be expecting their call.   Set up PT With [place] [facility] No DME required.

## 2020-02-10 ENCOUNTER — Encounter: Payer: Self-pay | Admitting: Cardiovascular Disease

## 2020-02-10 ENCOUNTER — Ambulatory Visit (INDEPENDENT_AMBULATORY_CARE_PROVIDER_SITE_OTHER): Payer: Medicare HMO | Admitting: Pharmacist Clinician (PhC)/ Clinical Pharmacy Specialist

## 2020-02-10 DIAGNOSIS — I4891 Unspecified atrial fibrillation: Secondary | ICD-10-CM

## 2020-02-10 DIAGNOSIS — Z7901 Long term (current) use of anticoagulants: Secondary | ICD-10-CM | POA: Diagnosis not present

## 2020-02-12 ENCOUNTER — Telehealth: Payer: Self-pay | Admitting: Cardiovascular Disease

## 2020-02-12 NOTE — Telephone Encounter (Signed)
Manson Allan nurse calling to confirm the patient's dosing of warfarin. He states the patient is not sure how she is supposed to take it. He would also like to know if she has a diagnosis of CHF and whether she needs to get on a scale on a regular basis.

## 2020-02-12 NOTE — Telephone Encounter (Signed)
Called the pt therapist and confirmed dosage along w/pt, pt stated that they forgot to hold for 2 days so I instructed them to do so and informed them of their accurate dosage. I also tried to schedule an appt with the coumadin and she stated that she wasn't ready to schedule as she is still trying to recuperate. Will await phone call to schedule

## 2020-02-13 ENCOUNTER — Telehealth: Payer: Self-pay | Admitting: Cardiovascular Disease

## 2020-02-13 NOTE — Telephone Encounter (Signed)
Attempted to call patient - phone just rang with no answer  Patient will NOT be able to do a virtual visit with MD. Can reschedule routine 1 year visit to another day, unless patient is having acute concerns

## 2020-02-13 NOTE — Telephone Encounter (Signed)
Patient calling requesting she have a virtual appointment, because she had a fall recently and is very sore. She is currently scheduled for an in officeappointment tomorrow.

## 2020-02-14 ENCOUNTER — Inpatient Hospital Stay (HOSPITAL_COMMUNITY)
Admission: RE | Admit: 2020-02-14 | Discharge: 2020-02-20 | DRG: 167 | Disposition: A | Discharge status: Skilled Nursing Facility | Payer: Medicare HMO | Attending: Internal Medicine | Admitting: Internal Medicine

## 2020-02-14 ENCOUNTER — Other Ambulatory Visit: Payer: Self-pay

## 2020-02-14 ENCOUNTER — Telehealth: Payer: Self-pay | Admitting: Cardiovascular Disease

## 2020-02-14 ENCOUNTER — Ambulatory Visit: Payer: Medicare HMO | Admitting: Cardiovascular Disease

## 2020-02-14 ENCOUNTER — Emergency Department (HOSPITAL_COMMUNITY): Payer: Medicare HMO

## 2020-02-14 DIAGNOSIS — I4819 Other persistent atrial fibrillation: Secondary | ICD-10-CM | POA: Diagnosis present

## 2020-02-14 DIAGNOSIS — Z85118 Personal history of other malignant neoplasm of bronchus and lung: Secondary | ICD-10-CM

## 2020-02-14 DIAGNOSIS — R296 Repeated falls: Secondary | ICD-10-CM

## 2020-02-14 DIAGNOSIS — Z20822 Contact with and (suspected) exposure to covid-19: Secondary | ICD-10-CM | POA: Diagnosis present

## 2020-02-14 DIAGNOSIS — Z882 Allergy status to sulfonamides status: Secondary | ICD-10-CM

## 2020-02-14 DIAGNOSIS — R062 Wheezing: Secondary | ICD-10-CM

## 2020-02-14 DIAGNOSIS — M069 Rheumatoid arthritis, unspecified: Secondary | ICD-10-CM | POA: Diagnosis present

## 2020-02-14 DIAGNOSIS — G8311 Monoplegia of lower limb affecting right dominant side: Secondary | ICD-10-CM | POA: Diagnosis present

## 2020-02-14 DIAGNOSIS — R590 Localized enlarged lymph nodes: Secondary | ICD-10-CM | POA: Diagnosis present

## 2020-02-14 DIAGNOSIS — Z7952 Long term (current) use of systemic steroids: Secondary | ICD-10-CM

## 2020-02-14 DIAGNOSIS — Z6841 Body Mass Index (BMI) 40.0 and over, adult: Secondary | ICD-10-CM

## 2020-02-14 DIAGNOSIS — R911 Solitary pulmonary nodule: Secondary | ICD-10-CM

## 2020-02-14 DIAGNOSIS — Z96653 Presence of artificial knee joint, bilateral: Secondary | ICD-10-CM | POA: Diagnosis present

## 2020-02-14 DIAGNOSIS — R202 Paresthesia of skin: Secondary | ICD-10-CM

## 2020-02-14 DIAGNOSIS — G8314 Monoplegia of lower limb affecting left nondominant side: Secondary | ICD-10-CM | POA: Diagnosis present

## 2020-02-14 DIAGNOSIS — I5032 Chronic diastolic (congestive) heart failure: Secondary | ICD-10-CM | POA: Diagnosis present

## 2020-02-14 DIAGNOSIS — Z79890 Hormone replacement therapy (postmenopausal): Secondary | ICD-10-CM | POA: Diagnosis not present

## 2020-02-14 DIAGNOSIS — Z902 Acquired absence of lung [part of]: Secondary | ICD-10-CM

## 2020-02-14 DIAGNOSIS — Z9181 History of falling: Secondary | ICD-10-CM | POA: Diagnosis not present

## 2020-02-14 DIAGNOSIS — I110 Hypertensive heart disease with heart failure: Secondary | ICD-10-CM | POA: Diagnosis present

## 2020-02-14 DIAGNOSIS — E785 Hyperlipidemia, unspecified: Secondary | ICD-10-CM | POA: Diagnosis present

## 2020-02-14 DIAGNOSIS — Z79899 Other long term (current) drug therapy: Secondary | ICD-10-CM

## 2020-02-14 DIAGNOSIS — Z9889 Other specified postprocedural states: Secondary | ICD-10-CM

## 2020-02-14 DIAGNOSIS — W010XXA Fall on same level from slipping, tripping and stumbling without subsequent striking against object, initial encounter: Secondary | ICD-10-CM | POA: Diagnosis present

## 2020-02-14 DIAGNOSIS — Z87891 Personal history of nicotine dependence: Secondary | ICD-10-CM

## 2020-02-14 DIAGNOSIS — Z79891 Long term (current) use of opiate analgesic: Secondary | ICD-10-CM | POA: Diagnosis not present

## 2020-02-14 DIAGNOSIS — E538 Deficiency of other specified B group vitamins: Secondary | ICD-10-CM | POA: Diagnosis present

## 2020-02-14 DIAGNOSIS — W19XXXA Unspecified fall, initial encounter: Secondary | ICD-10-CM

## 2020-02-14 DIAGNOSIS — R918 Other nonspecific abnormal finding of lung field: Principal | ICD-10-CM | POA: Diagnosis present

## 2020-02-14 DIAGNOSIS — Z885 Allergy status to narcotic agent status: Secondary | ICD-10-CM

## 2020-02-14 DIAGNOSIS — Z7901 Long term (current) use of anticoagulants: Secondary | ICD-10-CM

## 2020-02-14 DIAGNOSIS — G629 Polyneuropathy, unspecified: Secondary | ICD-10-CM | POA: Diagnosis present

## 2020-02-14 HISTORY — DX: Rheumatoid arthritis, unspecified: M06.9

## 2020-02-14 LAB — URINALYSIS, ROUTINE W REFLEX MICROSCOPIC
Bilirubin Urine: NEGATIVE
Glucose, UA: NEGATIVE mg/dL
Ketones, ur: NEGATIVE mg/dL
Leukocytes,Ua: NEGATIVE
Nitrite: NEGATIVE
Protein, ur: NEGATIVE mg/dL
Specific Gravity, Urine: 1.016 (ref 1.005–1.030)
pH: 5 (ref 5.0–8.0)

## 2020-02-14 LAB — COMPREHENSIVE METABOLIC PANEL
ALT: 25 U/L (ref 0–44)
AST: 29 U/L (ref 15–41)
Albumin: 3.2 g/dL — ABNORMAL LOW (ref 3.5–5.0)
Alkaline Phosphatase: 62 U/L (ref 38–126)
Anion gap: 12 (ref 5–15)
BUN: 12 mg/dL (ref 8–23)
CO2: 24 mmol/L (ref 22–32)
Calcium: 9.2 mg/dL (ref 8.9–10.3)
Chloride: 102 mmol/L (ref 98–111)
Creatinine, Ser: 0.81 mg/dL (ref 0.44–1.00)
GFR calc Af Amer: >60 mL/min (ref >60)
GFR calc non Af Amer: >60 mL/min (ref >60)
Glucose, Bld: 89 mg/dL (ref 70–99)
Potassium: 3.6 mmol/L (ref 3.5–5.1)
Sodium: 138 mmol/L (ref 135–145)
Total Bilirubin: 1.2 mg/dL (ref 0.3–1.2)
Total Protein: 5.7 g/dL — ABNORMAL LOW (ref 6.5–8.1)

## 2020-02-14 LAB — CBC
HCT: 44.1 % (ref 36.0–46.0)
Hemoglobin: 14.3 g/dL (ref 12.0–15.0)
MCH: 31.8 pg (ref 26.0–34.0)
MCHC: 32.4 g/dL (ref 30.0–36.0)
MCV: 98.2 fL (ref 80.0–100.0)
Platelets: 173 10*3/uL (ref 150–400)
RBC: 4.49 MIL/uL (ref 3.87–5.11)
RDW: 14.5 % (ref 11.5–15.5)
WBC: 8.2 10*3/uL (ref 4.0–10.5)
nRBC: 0 % (ref 0.0–0.2)

## 2020-02-14 LAB — PROTIME-INR
INR: 2.5 — ABNORMAL HIGH (ref 0.8–1.2)
Prothrombin Time: 25.8 seconds — ABNORMAL HIGH (ref 11.4–15.2)

## 2020-02-14 LAB — SARS CORONAVIRUS 2 BY RT PCR (HOSPITAL ORDER, PERFORMED IN ~~LOC~~ HOSPITAL LAB): SARS Coronavirus 2: NEGATIVE

## 2020-02-14 IMAGING — MR MR THORACIC SPINE W/O CM
5 of 8 series · 23 of 48 positions shown · non-contrast
Comparison: Limited correlation made with chest radiographs
[DATE] and lumbar spine radiographs [DATE].

CLINICAL DATA: Back pain with leg weakness and foot numbness.

EXAM:
MRI THORACIC AND LUMBAR SPINE WITHOUT CONTRAST
TECHNIQUE: Multiplanar and multiecho pulse sequences of the thoracic and lumbar
spine were obtained without intravenous contrast.

[Series 16: T1 · sagittal · 3.0mm · 0.62mm/px · 2 of 11 slices shown (1 of 3)]
[im 1/11]
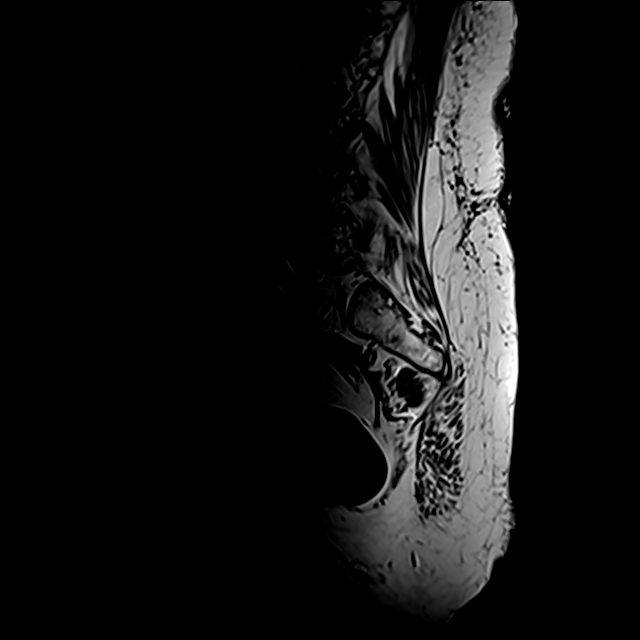
[im 11/11]
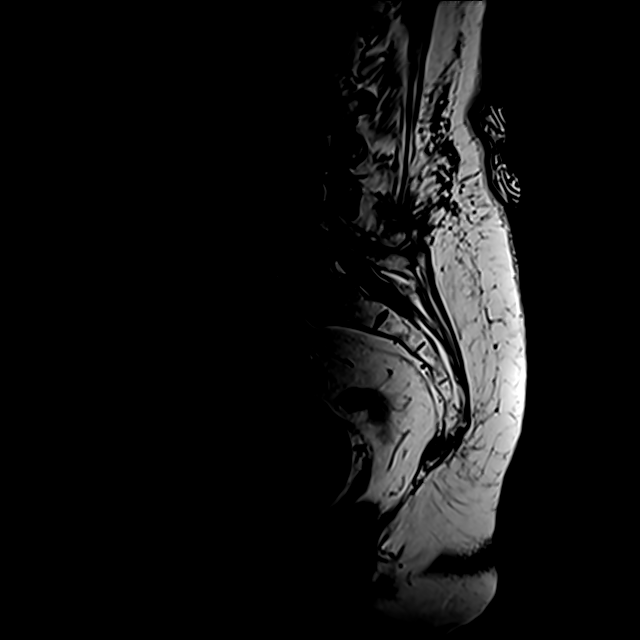

[Series 17: T1 · sagittal · 3.0mm · 0.62mm/px · 3 of 9 slices shown (2 of 3)]
[im 1/9]
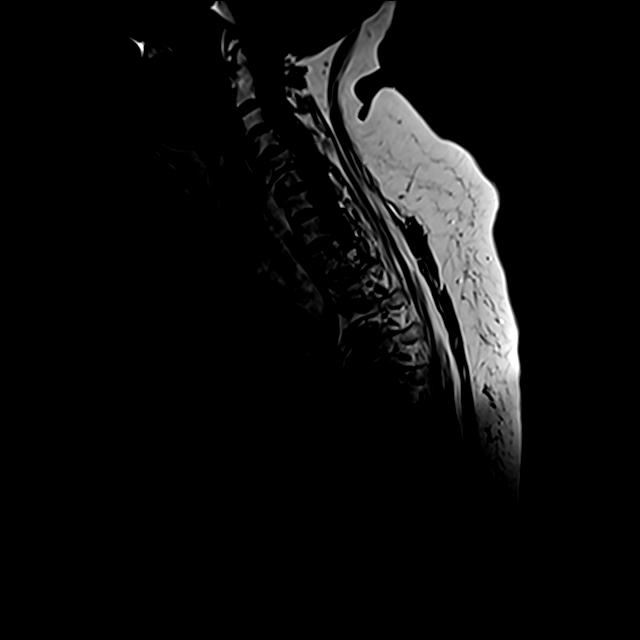
[im 5/9]
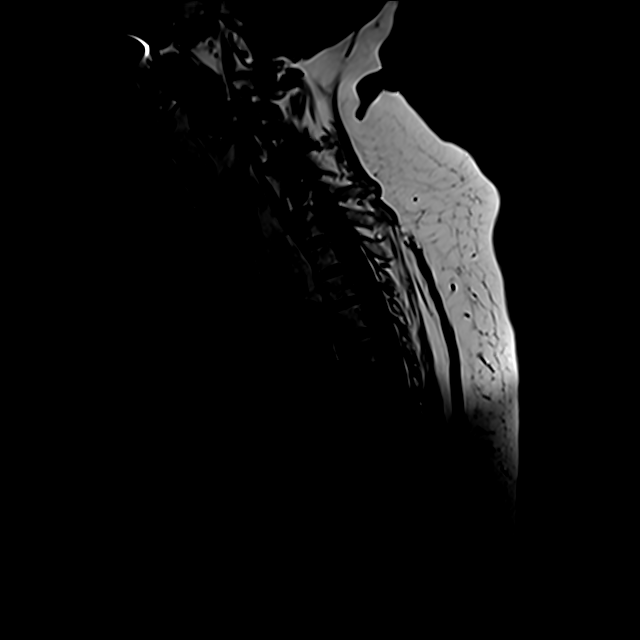
[im 9/9]
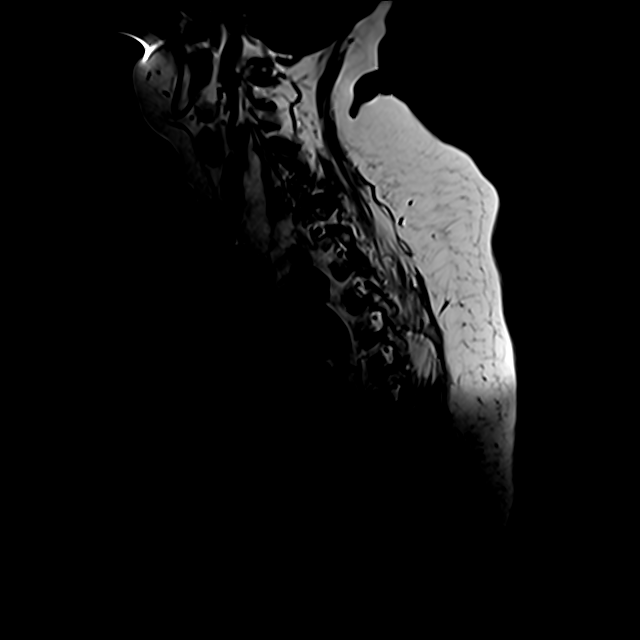

[Series 18: T1 · sagittal · 3.3mm · 0.62mm/px · 1 of 9 slices shown (3 of 3)]
[im 1/9]
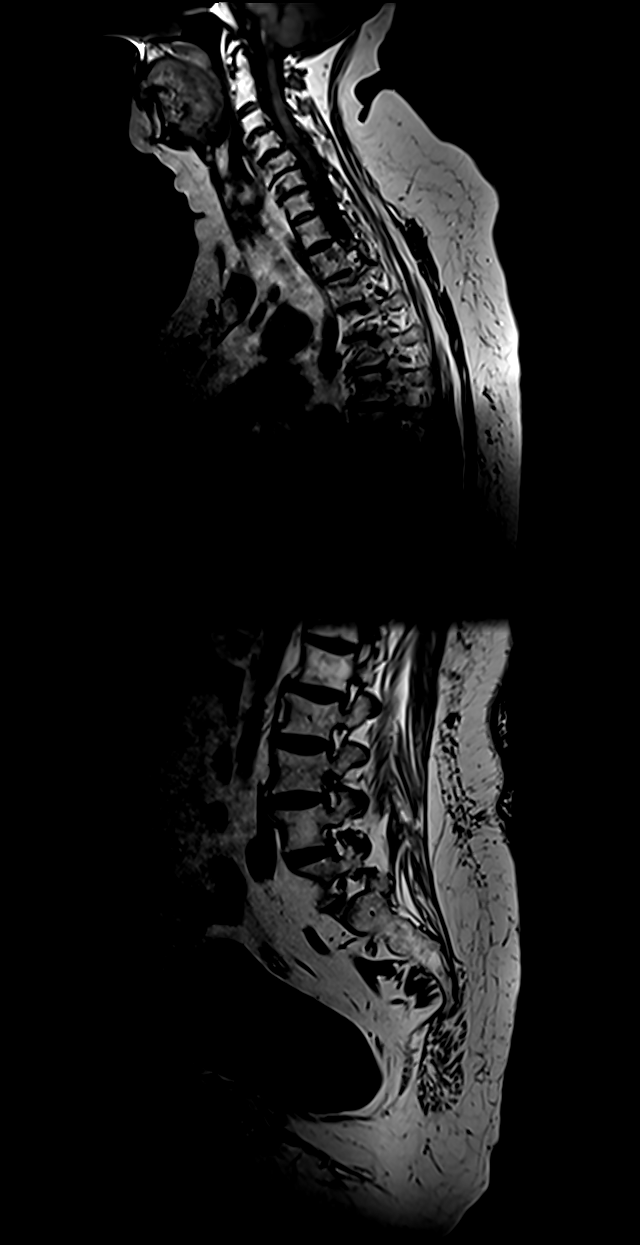

[Series 19: T2 · sagittal · 3.0mm · 0.76mm/px · 6 of 19 slices shown (1 of 2)]
[im 1/19]
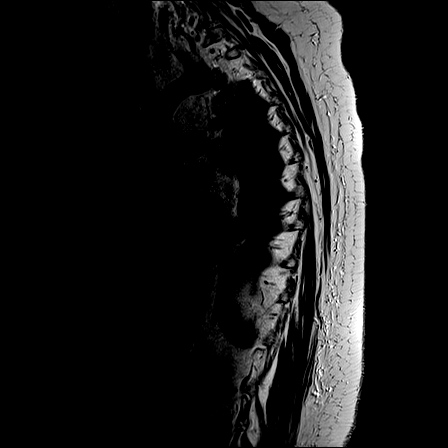
[im 4/19]
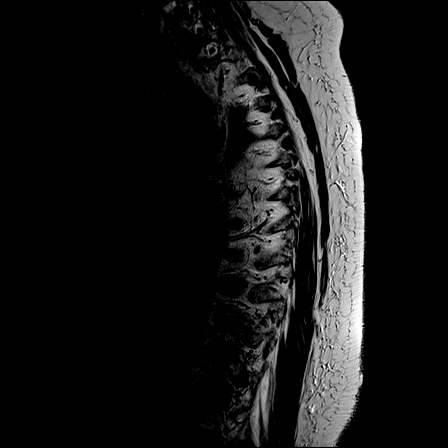
[im 8/19]
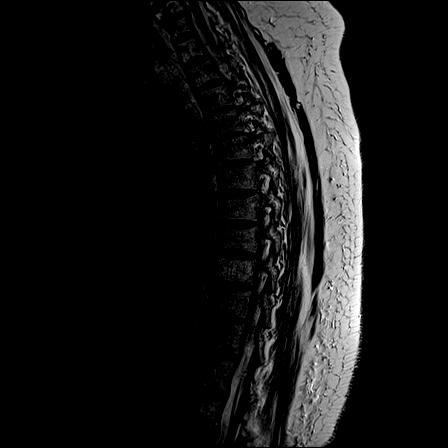
[im 11/19]
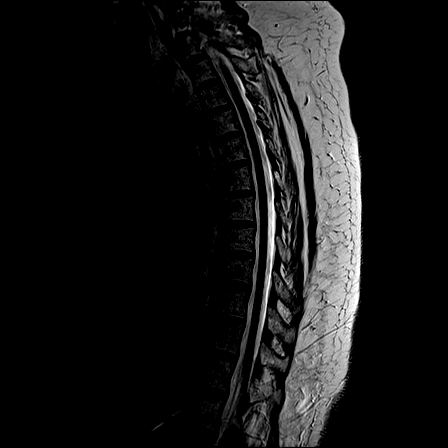
[im 15/19]
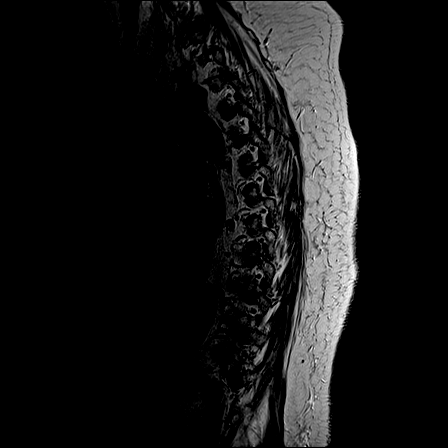
[im 19/19]
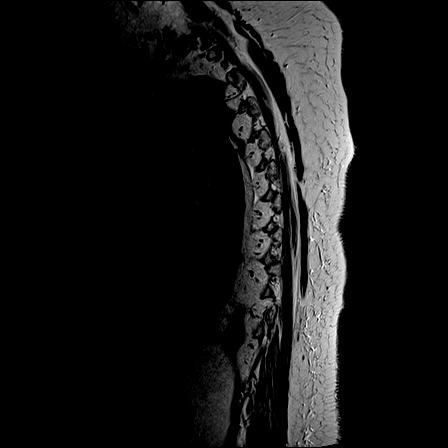

[Series 22: T2 · axial · 4.0mm · 0.74mm/px · z∈[-233,+2]mm · 11 of 39 slices shown (2 of 2)]
[im 1/39]
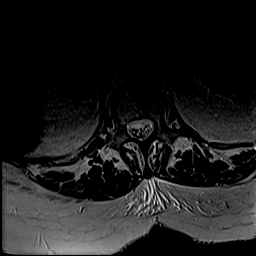
[im 4/39]
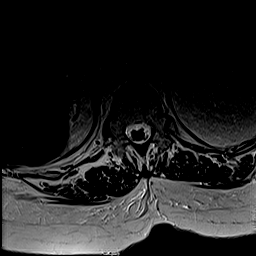
[im 8/39]
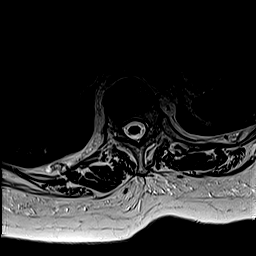
[im 12/39]
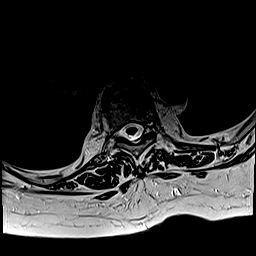
[im 16/39]
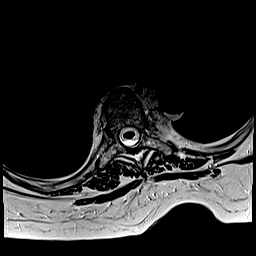
[im 20/39]
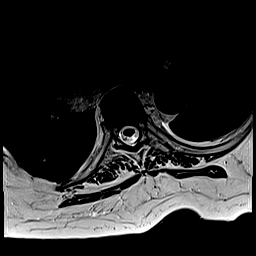
[im 23/39]
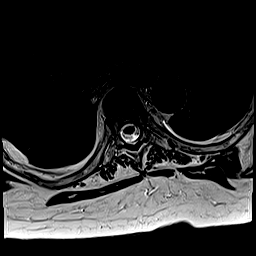
[im 27/39]
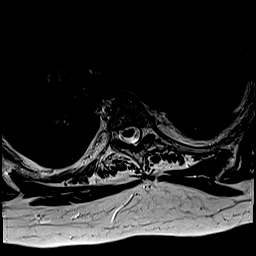
[im 31/39]
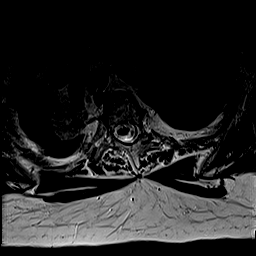
[im 35/39]
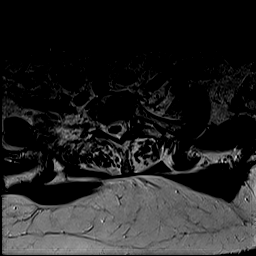
[im 39/39]
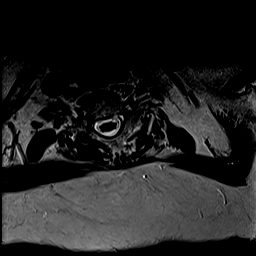

[23 of 48 positions shown; findings below may reference images not displayed]

FINDINGS: MRI THORACIC SPINE FINDINGS

Alignment: The alignment of the thoracic spine is normal. There are
moderately advanced degenerative changes in the cervical spine
associated with a mild anterolisthesis at C4-5.

Vertebrae: No acute or suspicious osseous findings. Multilevel
cervical spondylosis with endplate degenerative changes. No evidence
of osseous metastatic disease.

Cord:  The thoracic cord appears normal in signal and caliber.

Paraspinal and other soft tissues: There is an irregular mass
medially in the right upper lobe, measuring 2.9 x 2.4 cm on image
[DATE]. There are associated enlarged right hilar and paratracheal
lymph nodes, measuring up to 2.1 cm on image 14/22. These findings
are worrisome for bronchogenic carcinoma, and further evaluation
with chest CT recommended. No focal paraspinal abnormalities.

Disc levels:

The thoracic spinal canal is widely patent. There are minor thoracic
spine degenerative changes, and no evidence of disc herniation,
spinal stenosis or nerve root encroachment. As above, moderate
cervical spondylosis is present without gross cord deformity.

MRI LUMBAR SPINE FINDINGS

Segmentation: There are 5 lumbar type vertebral bodies.

Alignment: Minimal convex right scoliosis. Minimal degenerative
anterolisthesis at L4-5 and L5-S1.

Vertebrae: No worrisome osseous lesion, acute fracture or pars
defect. No evidence of osseous metastatic disease. The visualized
sacroiliac joints appear unremarkable.

Conus medullaris: Extends to the L1 level and appears normal.

Paraspinal and other soft tissues: No significant paraspinal
findings. Left renal cortical thinning and cysts are noted.

Disc levels:

L1-2: Mild disc bulging. No spinal stenosis or nerve root
encroachment.

L2-3: Normal interspace.

L3-4: Mild disc bulging, facet and ligamentous hypertrophy. No
significant spinal stenosis or nerve root encroachment.

L4-5: Mild disc bulging with moderate facet and ligamentous
hypertrophy. Mild spinal stenosis with mild narrowing of the lateral
recesses and right foramen. No nerve root encroachment.

L5-S1: Mild disc bulging and facet hypertrophy. No significant
spinal stenosis or nerve root encroachment.
IMPRESSION: 1. Irregular mass medially in the right upper lobe with associated
right hilar and paratracheal adenopathy, highly worrisome for
bronchogenic carcinoma. Further evaluation with chest CT with
contrast recommended.
2. No acute findings or clear explanation for the patient's symptoms
in the spine. No evidence of osseous metastatic disease.
3. Mild multifactorial spinal stenosis at L4-5 with mild narrowing
of the lateral recesses and right foramen.
4. Mild disc bulging and facet hypertrophy at the other levels as
described. No high-grade spinal stenosis or nerve root encroachment.
5. Moderate to severe degenerative changes in the cervical spine.

## 2020-02-14 IMAGING — CT CT CHEST W/ CM
2 of 5 series · 14 of 36 positions shown, 17 images · IV contrast (APPLIED)
Comparison: Chest radiograph dated [DATE].

CLINICAL DATA: 79-year-old female with with generalized weakness
and not being able to stand.

EXAM:
CT CHEST WITH CONTRAST
TECHNIQUE: Multidetector CT imaging of the chest was performed during
intravenous contrast administration.
CONTRAST:  75mL OMNIPAQUE IOHEXOL 300 MG/ML  SOLN

[Series 5: thins · axial · 0.87mm/px · z∈[-372,-126]mm · 11 of 406 slices shown, 14 images]
[im 28/406  mediastinal]
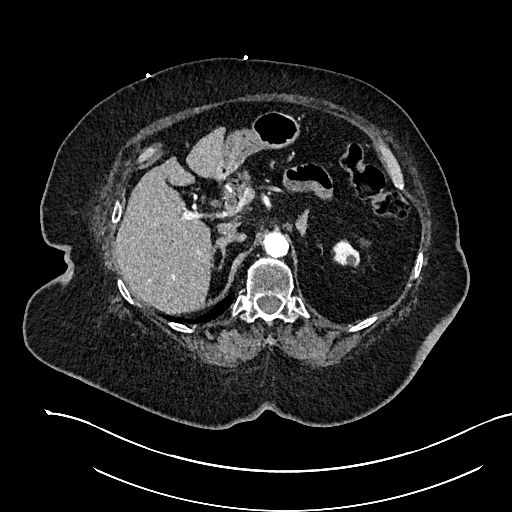
[im 28/406  lung]
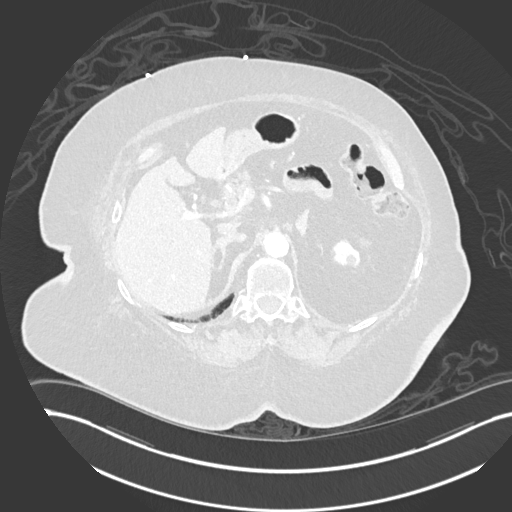
[im 55/406  lung]
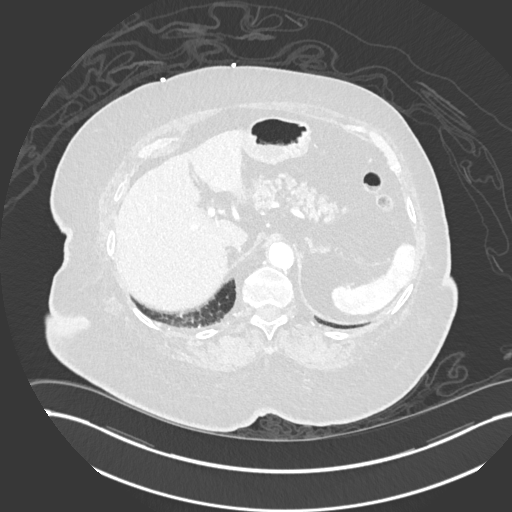
[im 109/406  lung]
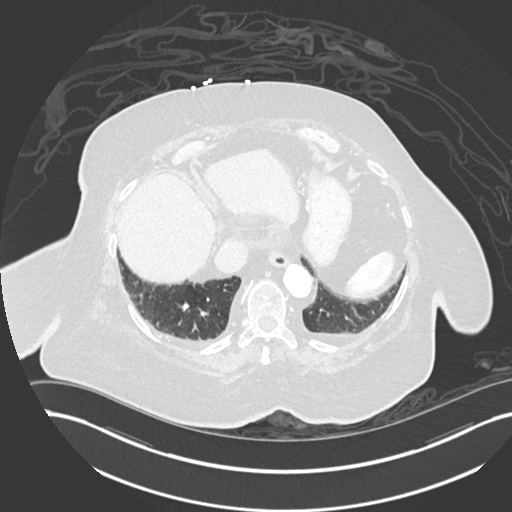
[im 136/406  lung]
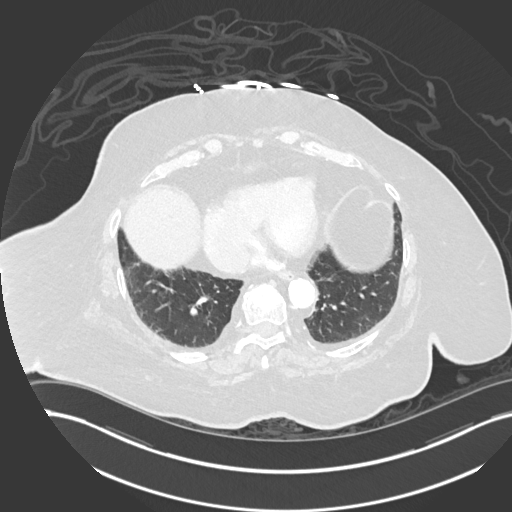
[im 163/406  mediastinal]
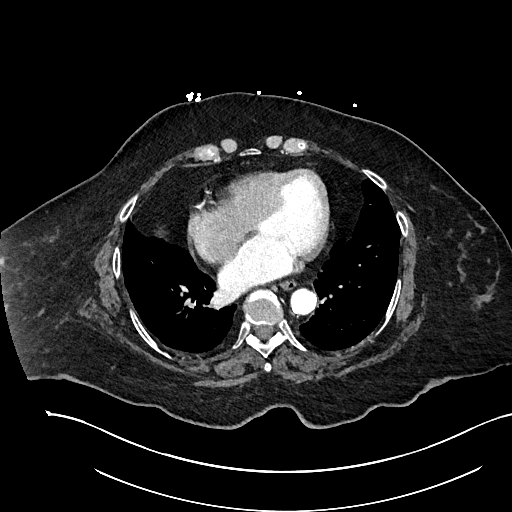
[im 163/406  lung]
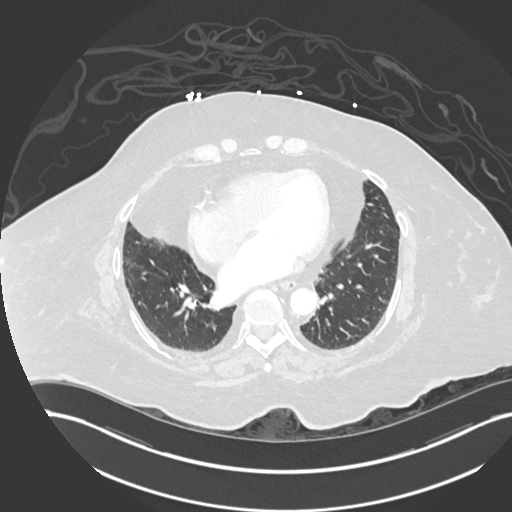
[im 217/406  lung]
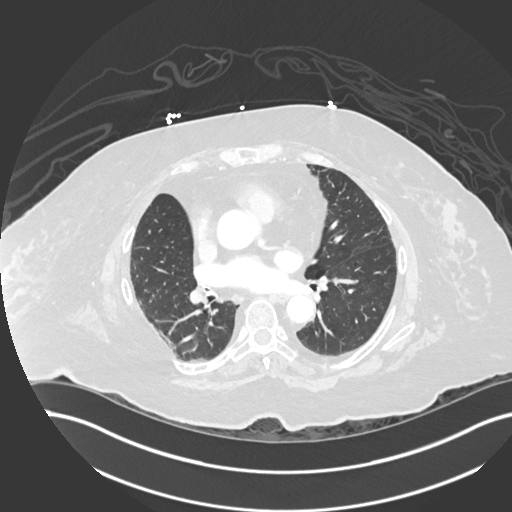
[im 244/406  lung]
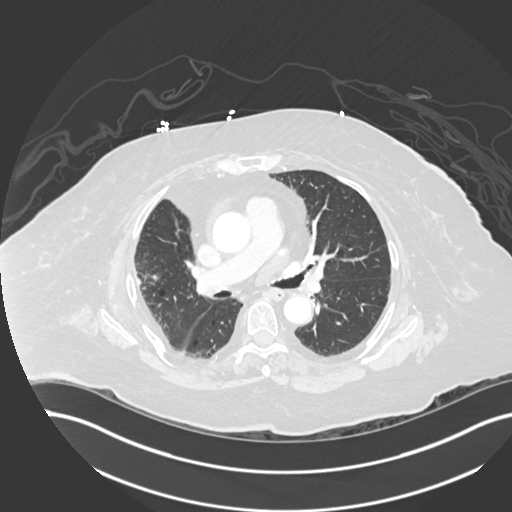
[im 271/406  lung]
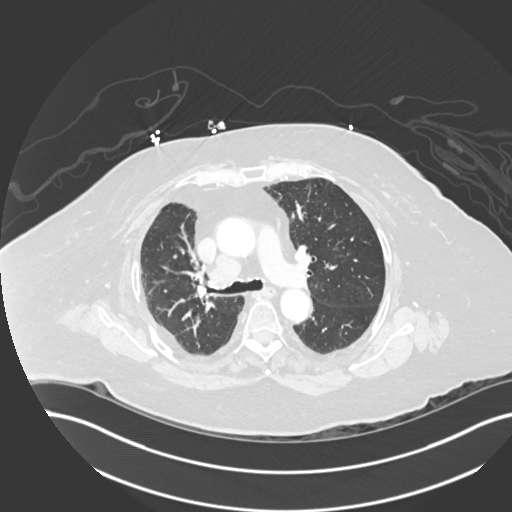
[im 298/406  mediastinal]
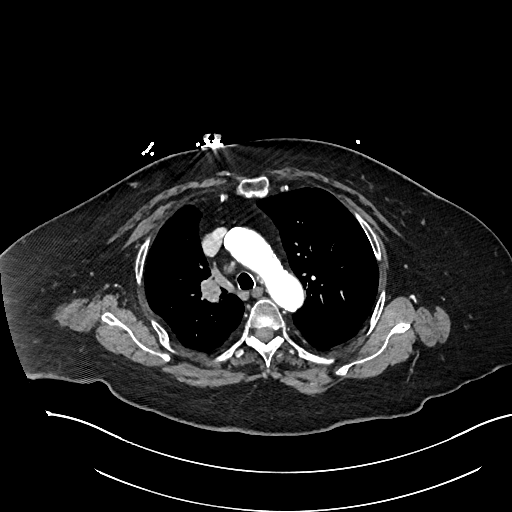
[im 298/406  lung]
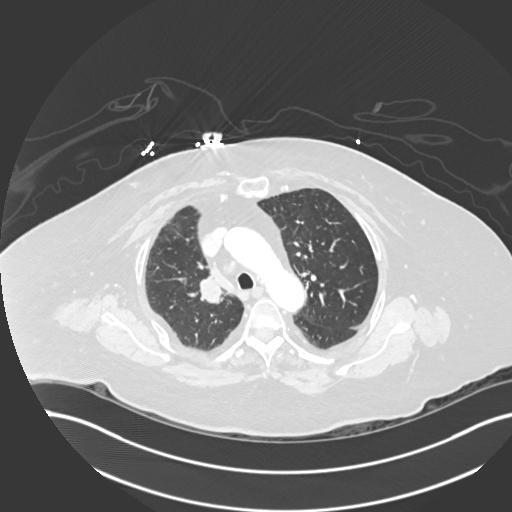
[im 352/406  lung]
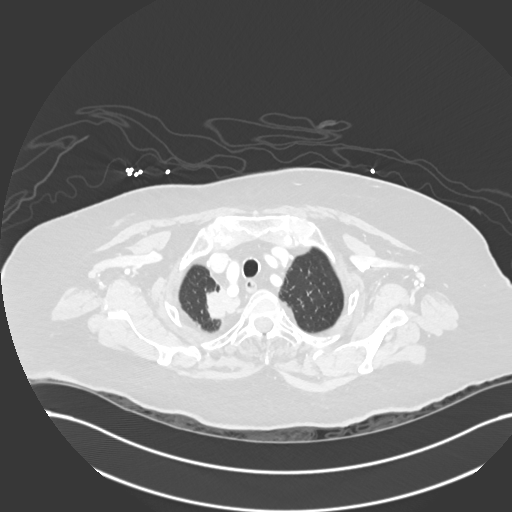
[im 379/406  lung]
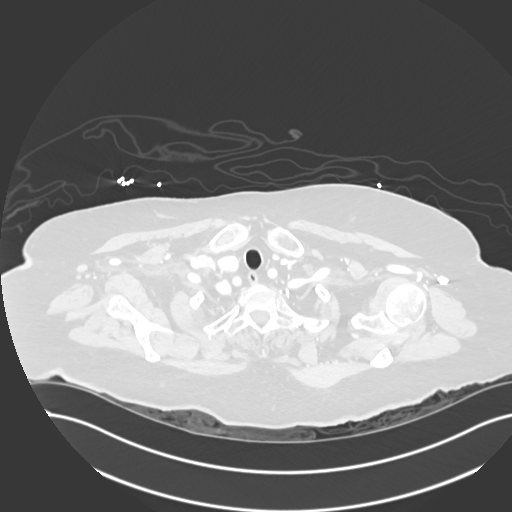

[Series 6: cor · coronal · 0.57mm/px · 3 of 164 slices shown]
[im 33/164  lung]
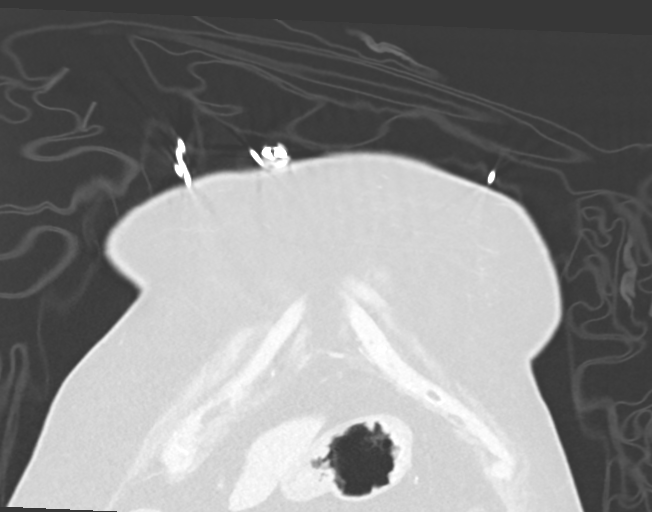
[im 66/164  lung]
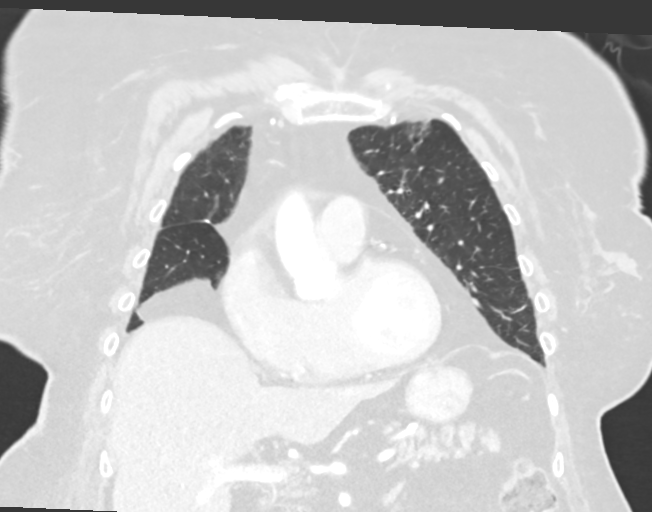
[im 98/164  lung]
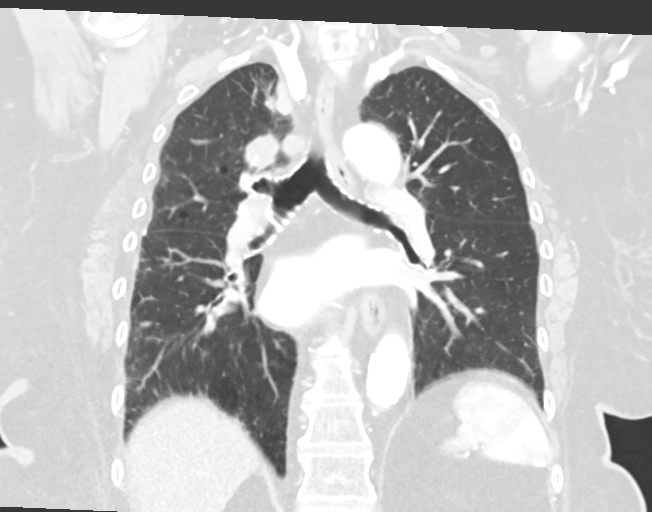

[14 of 36 positions shown; findings below may reference images not displayed]

FINDINGS: Evaluation of this exam is limited in the absence of intravenous
contrast.

Cardiovascular: Borderline cardiomegaly with mild dilatation of the
left atrium. There is coronary vascular calcification primarily
involving the LAD and RCA. There is advanced atherosclerotic
calcification of the thoracic aorta. No aneurysmal dilatation or
dissection. The origins of the great vessels of the aortic arch
appear patent as visualized. The central pulmonary arteries appear
patent. Evaluation of the pulmonary arteries is limited due to
suboptimal opacification and timing of the contrast.

Mediastinum/Nodes: There is a lobulated mass or enlarged abnormal
lymph node in the mediastinum anterior to the right mainstem
bronchus measuring 2.5 x 2.3 cm most consistent with metastatic
disease. There is extension of this mass into the right hilum. The
esophagus is grossly unremarkable. No mediastinal fluid collection.

Lungs/Pleura: There is a lobulated right apical subpleural nodule
measuring 2.8 x 2.1 cm extending inferiorly to the right suprahilar
region consistent with malignancy. There is a 2.0 x 1.6 cm right
suprahilar soft tissue nodule. There is a cluster of nodular density
in the right upper lobe (54/4) extending to the right lateral
pleural surface and along the right minor fissure most consistent
with satellite nodules. There is no lobar consolidation, pleural
effusion, pneumothorax. Mild background of emphysema. There is mild
narrowing of the right upper lobe bronchus centrally secondary to
mass effect and compression by the right hilar/mediastinal mass. The
central airways however remain patent.

Upper Abdomen: Subcentimeter left renal upper pole hypodense focus.
The visualized upper abdomen is otherwise unremarkable.

Musculoskeletal: There is osteopenia with degenerative changes of
the spine. T9 hemangioma. No acute osseous pathology.
IMPRESSION: 1. Right apical subpleural and right suprahilar masses consistent
with malignancy. There is extension of this mass into the right
hilum.
2. Right mediastinal soft tissue mass or abnormal lymph node with
extension to the right hilum. Bronchoscopy may provide better
evaluation.
3. A cluster of nodular density in the right upper lobe extending to
the right lateral pleural surface and along the right minor fissure
most consistent with satellite nodules.
4. Aortic Atherosclerosis ([3W]-[3W]) and Emphysema ([3W]-[3W]).

## 2020-02-14 IMAGING — MR MR LUMBAR SPINE W/O CM
5 series · 35 of 48 positions shown · non-contrast
Comparison: Limited correlation made with chest radiographs
[DATE] and lumbar spine radiographs [DATE].

CLINICAL DATA: Back pain with leg weakness and foot numbness.

EXAM:
MRI THORACIC AND LUMBAR SPINE WITHOUT CONTRAST
TECHNIQUE: Multiplanar and multiecho pulse sequences of the thoracic and lumbar
spine were obtained without intravenous contrast.

[Series 1: T2 · sagittal · 4.0mm · 1.09mm/px · 8 of 16 slices shown (1 of 2)]
[im 1/16]
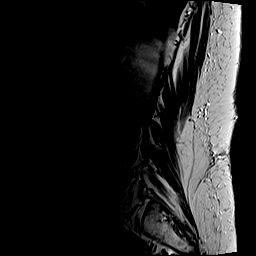
[im 3/16]
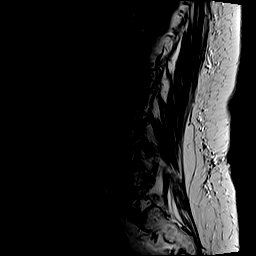
[im 5/16]
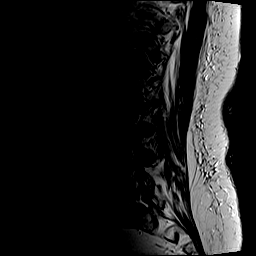
[im 7/16]
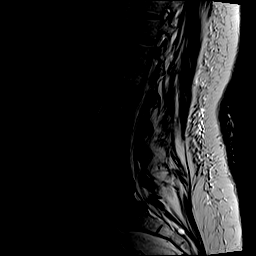
[im 9/16]
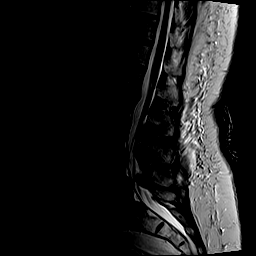
[im 11/16]
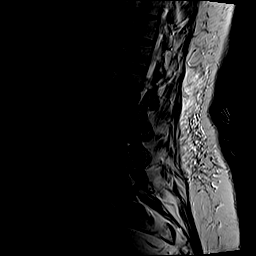
[im 13/16]
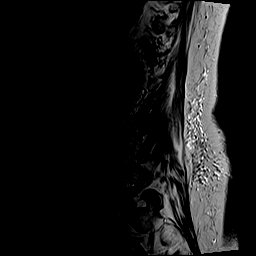
[im 16/16]
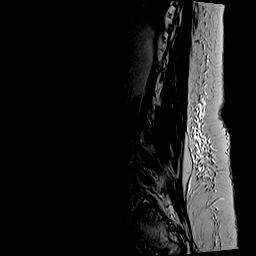

[Series 2: STIR · sagittal · 4.0mm · 0.51mm/px · 1 of 16 slices shown]
[im 1/16]
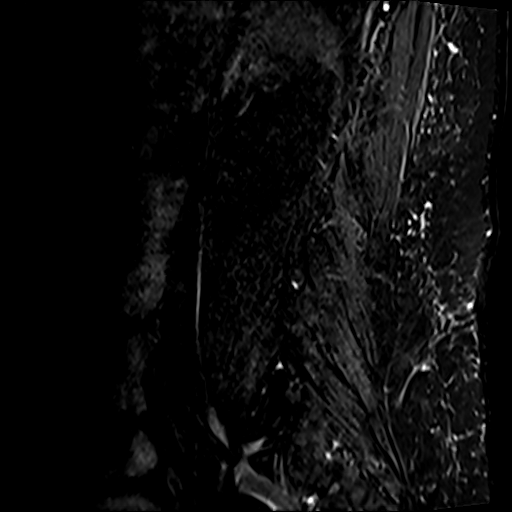

[Series 3: T1 · sagittal · 4.0mm · 1.09mm/px · 8 of 16 slices shown (1 of 2)]
[im 1/16]
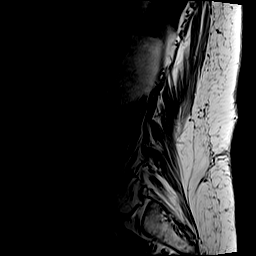
[im 3/16]
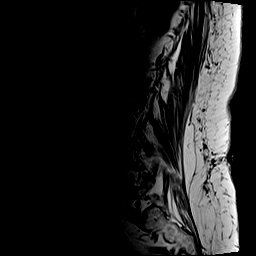
[im 5/16]
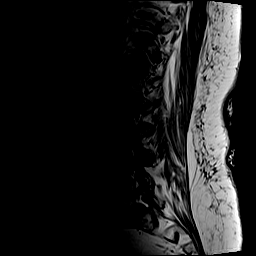
[im 7/16]
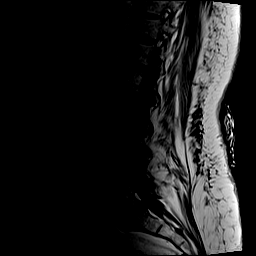
[im 9/16]
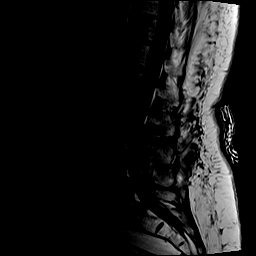
[im 11/16]
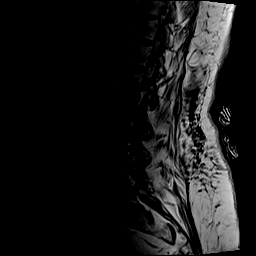
[im 13/16]
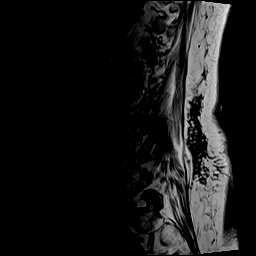
[im 16/16]
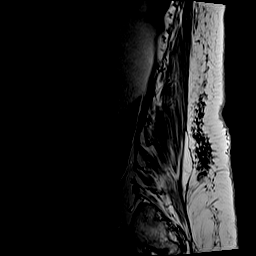

[Series 4: T2 · axial · 5.0mm · 0.86mm/px · z∈[-438,-241]mm · 9 of 26 slices shown (2 of 2)]
[im 1/26]
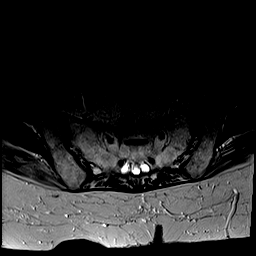
[im 5/26]
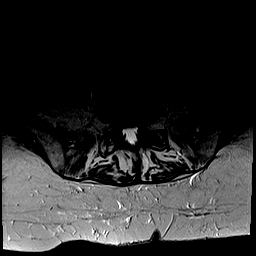
[im 7/26]
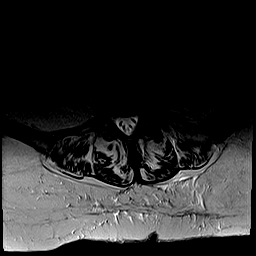
[im 12/26]
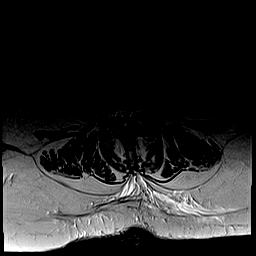
[im 14/26]
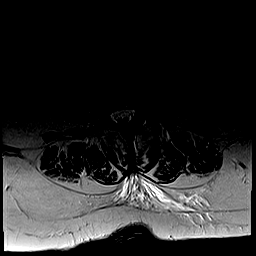
[im 19/26]
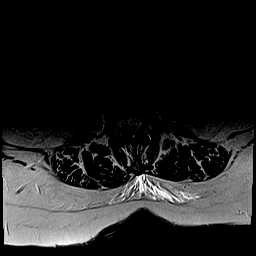
[im 21/26]
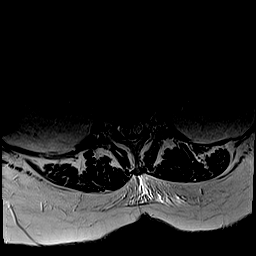
[im 23/26]
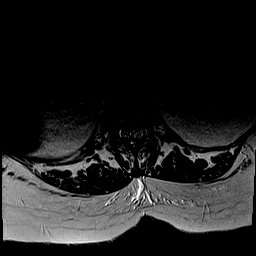
[im 26/26]
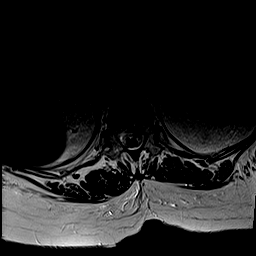

[Series 5: T1 · axial · 5.0mm · 0.43mm/px · z∈[-438,-241]mm · 9 of 26 slices shown (2 of 2)]
[im 1/26]
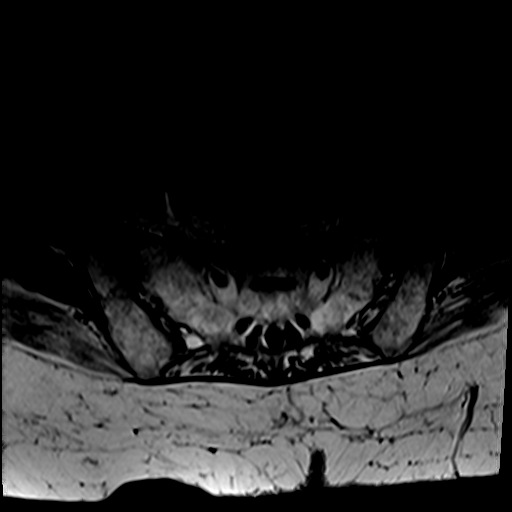
[im 5/26]
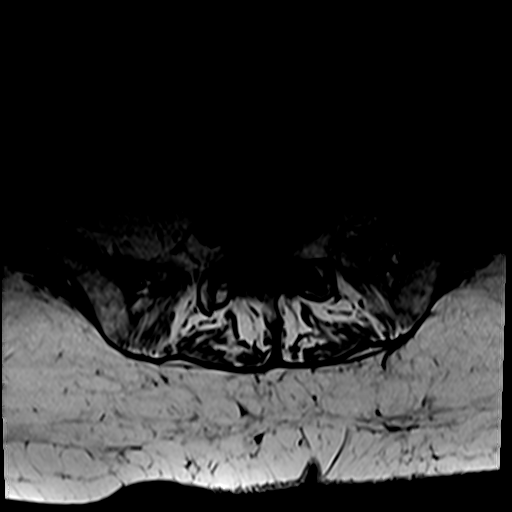
[im 7/26]
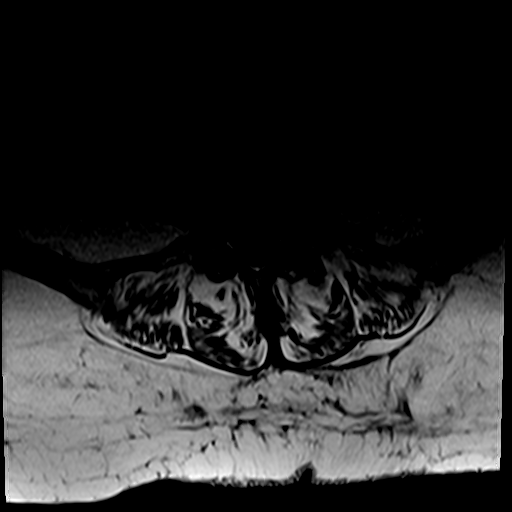
[im 12/26]
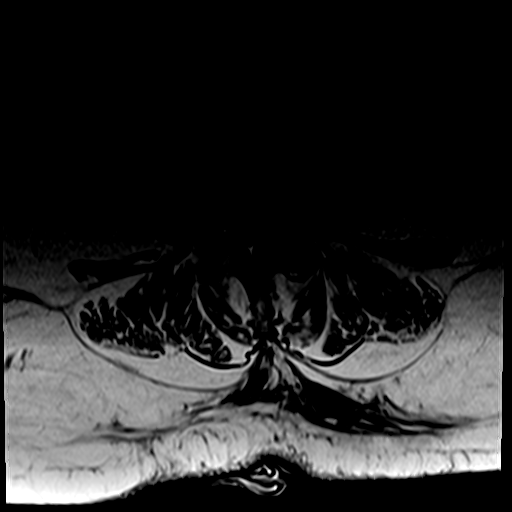
[im 14/26]
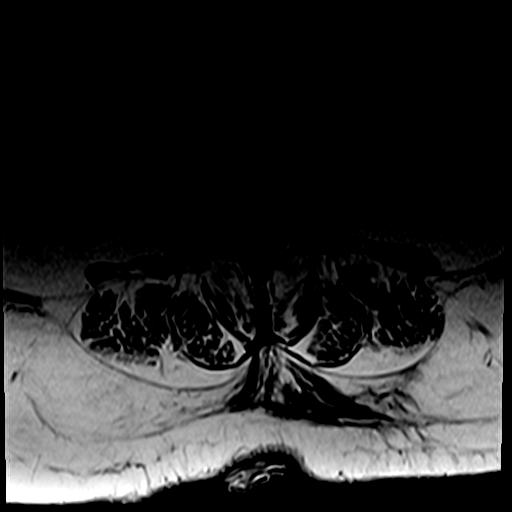
[im 19/26]
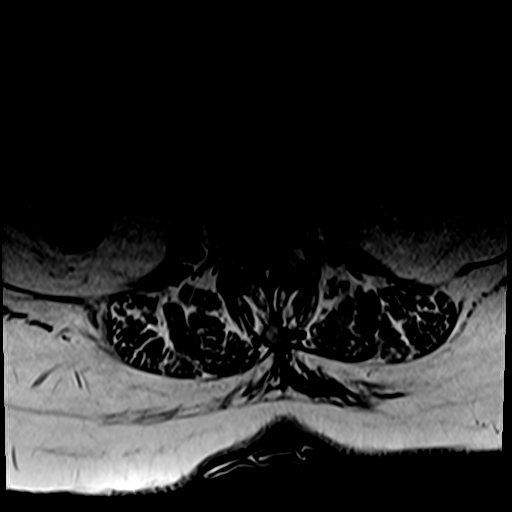
[im 21/26]
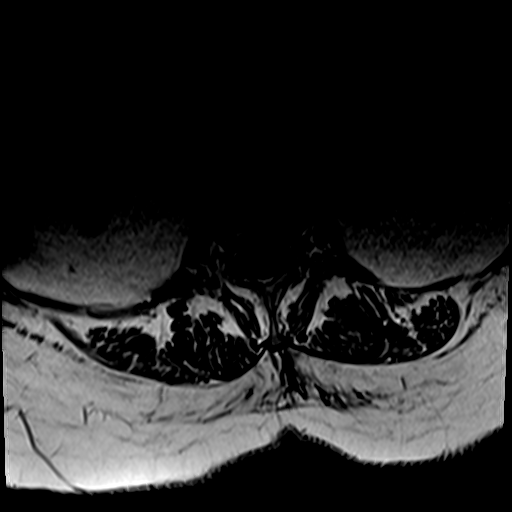
[im 23/26]
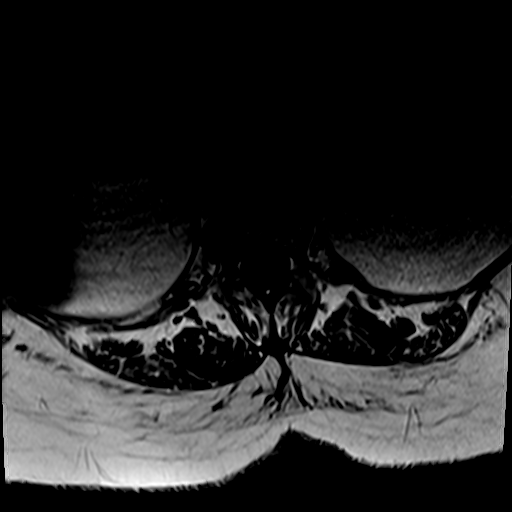
[im 26/26]
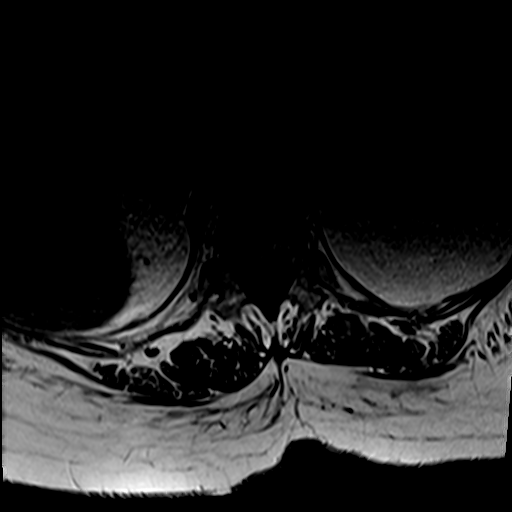

[35 of 48 positions shown; findings below may reference images not displayed]

FINDINGS: MRI THORACIC SPINE FINDINGS

Alignment: The alignment of the thoracic spine is normal. There are
moderately advanced degenerative changes in the cervical spine
associated with a mild anterolisthesis at C4-5.

Vertebrae: No acute or suspicious osseous findings. Multilevel
cervical spondylosis with endplate degenerative changes. No evidence
of osseous metastatic disease.

Cord:  The thoracic cord appears normal in signal and caliber.

Paraspinal and other soft tissues: There is an irregular mass
medially in the right upper lobe, measuring 2.9 x 2.4 cm on image
[DATE]. There are associated enlarged right hilar and paratracheal
lymph nodes, measuring up to 2.1 cm on image 14/22. These findings
are worrisome for bronchogenic carcinoma, and further evaluation
with chest CT recommended. No focal paraspinal abnormalities.

Disc levels:

The thoracic spinal canal is widely patent. There are minor thoracic
spine degenerative changes, and no evidence of disc herniation,
spinal stenosis or nerve root encroachment. As above, moderate
cervical spondylosis is present without gross cord deformity.

MRI LUMBAR SPINE FINDINGS

Segmentation: There are 5 lumbar type vertebral bodies.

Alignment: Minimal convex right scoliosis. Minimal degenerative
anterolisthesis at L4-5 and L5-S1.

Vertebrae: No worrisome osseous lesion, acute fracture or pars
defect. No evidence of osseous metastatic disease. The visualized
sacroiliac joints appear unremarkable.

Conus medullaris: Extends to the L1 level and appears normal.

Paraspinal and other soft tissues: No significant paraspinal
findings. Left renal cortical thinning and cysts are noted.

Disc levels:

L1-2: Mild disc bulging. No spinal stenosis or nerve root
encroachment.

L2-3: Normal interspace.

L3-4: Mild disc bulging, facet and ligamentous hypertrophy. No
significant spinal stenosis or nerve root encroachment.

L4-5: Mild disc bulging with moderate facet and ligamentous
hypertrophy. Mild spinal stenosis with mild narrowing of the lateral
recesses and right foramen. No nerve root encroachment.

L5-S1: Mild disc bulging and facet hypertrophy. No significant
spinal stenosis or nerve root encroachment.
IMPRESSION: 1. Irregular mass medially in the right upper lobe with associated
right hilar and paratracheal adenopathy, highly worrisome for
bronchogenic carcinoma. Further evaluation with chest CT with
contrast recommended.
2. No acute findings or clear explanation for the patient's symptoms
in the spine. No evidence of osseous metastatic disease.
3. Mild multifactorial spinal stenosis at L4-5 with mild narrowing
of the lateral recesses and right foramen.
4. Mild disc bulging and facet hypertrophy at the other levels as
described. No high-grade spinal stenosis or nerve root encroachment.
5. Moderate to severe degenerative changes in the cervical spine.

## 2020-02-14 MED ORDER — IOHEXOL 300 MG/ML  SOLN
75.0000 mL | Freq: Once | INTRAMUSCULAR | Status: AC | PRN
  Administered 2020-02-14: 75 mL via INTRAVENOUS

## 2020-02-14 MED ORDER — CALCIUM CARBONATE ANTACID 500 MG PO CHEW: 750.0000 mg | CHEWABLE_TABLET | Freq: Every day | ORAL | Status: DC | PRN

## 2020-02-14 MED ORDER — WARFARIN SODIUM 2.5 MG PO TABS
2.5000 mg | ORAL_TABLET | Freq: Once | ORAL | Status: AC
  Administered 2020-02-15: 2.5 mg via ORAL
  Filled 2020-02-14 (×2): qty 1

## 2020-02-14 MED ORDER — VITAMIN D 25 MCG (1000 UNIT) PO TABS
1000.0000 [IU] | ORAL_TABLET | Freq: Every day | ORAL | Status: DC
  Administered 2020-02-15 – 2020-02-17 (×3): 1000 [IU] via ORAL
  Filled 2020-02-14 (×3): qty 1

## 2020-02-14 MED ORDER — ESTROGENS, CONJUGATED 0.625 MG/GM VA CREA
1.0000 | TOPICAL_CREAM | VAGINAL | Status: DC
  Administered 2020-02-17 – 2020-02-20 (×2): 1 via VAGINAL
  Filled 2020-02-14: qty 30

## 2020-02-14 MED ORDER — LOSARTAN POTASSIUM 25 MG PO TABS
25.0000 mg | ORAL_TABLET | Freq: Every day | ORAL | Status: DC
  Administered 2020-02-15: 25 mg via ORAL
  Filled 2020-02-14: qty 1

## 2020-02-14 MED ORDER — TRAMADOL HCL 50 MG PO TABS
50.0000 mg | ORAL_TABLET | Freq: Once | ORAL | Status: AC
  Administered 2020-02-14: 50 mg via ORAL
  Filled 2020-02-14: qty 1

## 2020-02-14 MED ORDER — POTASSIUM CHLORIDE CRYS ER 20 MEQ PO TBCR
40.0000 meq | EXTENDED_RELEASE_TABLET | Freq: Once | ORAL | Status: AC
  Administered 2020-02-15: 40 meq via ORAL
  Filled 2020-02-14: qty 2

## 2020-02-14 MED ORDER — ZOLPIDEM TARTRATE 5 MG PO TABS
5.0000 mg | ORAL_TABLET | Freq: Every evening | ORAL | Status: DC | PRN
  Administered 2020-02-15 – 2020-02-19 (×6): 5 mg via ORAL
  Filled 2020-02-14 (×7): qty 1

## 2020-02-14 MED ORDER — LOSARTAN POTASSIUM 50 MG PO TABS
50.0000 mg | ORAL_TABLET | Freq: Every day | ORAL | Status: DC
  Administered 2020-02-15 – 2020-02-17 (×3): 50 mg via ORAL
  Filled 2020-02-14 (×3): qty 1

## 2020-02-14 MED ORDER — ALBUTEROL SULFATE (2.5 MG/3ML) 0.083% IN NEBU
3.0000 mL | INHALATION_SOLUTION | Freq: Four times a day (QID) | RESPIRATORY_TRACT | Status: DC | PRN
  Administered 2020-02-20 (×2): 3 mL via RESPIRATORY_TRACT
  Filled 2020-02-14 (×2): qty 3

## 2020-02-14 MED ORDER — TRAMADOL HCL 50 MG PO TABS
50.0000 mg | ORAL_TABLET | Freq: Four times a day (QID) | ORAL | Status: DC | PRN
  Administered 2020-02-15 – 2020-02-20 (×12): 50 mg via ORAL
  Filled 2020-02-14 (×13): qty 1

## 2020-02-14 MED ORDER — WARFARIN - PHARMACIST DOSING INPATIENT: Freq: Every day | Status: DC

## 2020-02-14 MED ORDER — SIMVASTATIN 20 MG PO TABS
20.0000 mg | ORAL_TABLET | Freq: Every day | ORAL | Status: DC
  Administered 2020-02-15 – 2020-02-20 (×7): 20 mg via ORAL
  Filled 2020-02-14 (×7): qty 1

## 2020-02-14 MED ORDER — FUROSEMIDE 40 MG PO TABS
40.0000 mg | ORAL_TABLET | Freq: Every day | ORAL | Status: DC
  Administered 2020-02-15 (×2): 40 mg via ORAL
  Filled 2020-02-14 (×2): qty 1

## 2020-02-14 MED ORDER — PANTOPRAZOLE SODIUM 40 MG PO TBEC
40.0000 mg | DELAYED_RELEASE_TABLET | Freq: Every day | ORAL | Status: DC
  Administered 2020-02-15 – 2020-02-17 (×3): 40 mg via ORAL
  Filled 2020-02-14 (×3): qty 1

## 2020-02-14 MED ORDER — METOPROLOL SUCCINATE ER 25 MG PO TB24
12.5000 mg | ORAL_TABLET | Freq: Every day | ORAL | Status: DC
  Administered 2020-02-15 – 2020-02-20 (×6): 12.5 mg via ORAL
  Filled 2020-02-14 (×6): qty 1

## 2020-02-14 MED ORDER — LEFLUNOMIDE 20 MG PO TABS
20.0000 mg | ORAL_TABLET | Freq: Every day | ORAL | Status: DC
  Administered 2020-02-15: 20 mg via ORAL
  Filled 2020-02-14: qty 1

## 2020-02-14 NOTE — ED Notes (Signed)
Pt transported to CT ?

## 2020-02-14 NOTE — H&P (Signed)
Date: 02/14/2020               Patient Name:  Leah Velasquez MRN: [id number]  DOB: February 02, 1941 Age / Sex: 79 y.o., female   PCP: [name], MD         Medical Service: Internal Medicine Teaching Service         Attending Physician: Dr. Velna Ochs, MD    First Contact: Dr. Marianna Payment Pager: [phone]  Second Contact: Dr. Sharon Seller  Pager: [phone]       After Hours (After 5p/  First Contact Pager: [phone]  weekends / holidays): Second Contact Pager: [phone]   Chief Complaint: weakness, numbness   History of Present Illness: Leah Velasquez is a 79 y/o female with chronic afib on Coumadin, HTN, HFpEF, remote history of lung cancer s/p resection who presents with 1 month history of progressive lower extremity weakness, as well as numbness in her feet and hands.  Patient first noticed symptoms earlier this month when she continued to have a difficult time getting out of bed. She has had repeated falls as a result requiring multiple ED visits. Over the last several months, she has had a difficult time managing her joint pain in her bilateral ankles and knees. Her rheumatologist most recently tried a course of solumedrol which helped initially, but joint pain and swelling quickly returned. She was previously on daily Prednisone for her RA, but was taken off of it this month due to adverse effects, primarily weight gain.  The paraesthesias have been a more subacute issue that she has noticed in the last 3 weeks. She frequently wakes up at night due to the pain. No history of similar symptoms.  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 She denies any weight loss, cough, hemoptysis, chest pain or shortness of breath.  No headaches, fever, recent illness, abdominal pain, n/v, changes in appetite, change in bowel or bladder habits.   Meds:  No current facility-administered medications on file prior to encounter.   Current Outpatient Medications  on File Prior to Encounter  Medication Sig Dispense Refill  . albuterol (PROVENTIL HFA;VENTOLIN HFA) 108 (90 Base) MCG/ACT inhaler Inhale 2 puffs into the lungs every 6 (six) hours as needed for wheezing or shortness of breath. 1 Inhaler 0  . albuterol (VENTOLIN HFA) 108 (90 Base) MCG/ACT inhaler Inhale 1-2 puffs into the lungs every 6 (six) hours as needed for wheezing or shortness of breath.    . calcium carbonate (TUMS - DOSED IN MG ELEMENTAL CALCIUM) 500 MG chewable tablet Chew 1 tablet (200 mg of elemental calcium total) by mouth 3 (three) times daily as needed for indigestion or heartburn.    . calcium carbonate (TUMS EX) 750 MG chewable tablet Chew 1 tablet by mouth daily as needed for heartburn.    . Cholecalciferol (D3-1000) 25 MCG (1000 UT) tablet Take 1,000 Units by mouth daily.    . Cholecalciferol (VITAMIN D3) 1000 units CAPS Take 1,000 Units by mouth daily.    . Colchicine 0.6 MG CAPS Take 0.6 mg by mouth as needed.    . conjugated estrogens (PREMARIN) vaginal cream Place 1 Applicatorful vaginally 2 (two) times a week.    . dorzolamide-timolol (COSOPT) 22.3-6.8 MG/ML ophthalmic solution     . Esomeprazole Magnesium (NEXIUM PO) Take 1 capsule by mouth daily as needed (Indigestion).    . Estrogens Conjugated (PREMARIN PO) Apply 0.5 g topically 2 (two) times  a week.    . fluticasone (FLONASE) 50 MCG/ACT nasal spray Place 1 spray into both nostrils daily.     . fluticasone (FLONASE) 50 MCG/ACT nasal spray Place 1 spray into both nostrils daily.    . fluticasone (FLONASE) 50 MCG/ACT nasal spray Place 1 spray into both nostrils daily as needed for allergies or rhinitis.    . folic acid (FOLVITE) 1 MG tablet Take 1 mg by mouth 2 (two) times a day.     . furosemide (LASIX) 40 MG tablet Take 1 tablet (40 mg total) by mouth daily. 90 tablet 1  . furosemide (LASIX) 40 MG tablet Take 40 mg by mouth.    . hydrocortisone (ANUSOL-HC) 2.5 % rectal cream Apply rectally 2 times daily 28.35 g 1  .  leflunomide (ARAVA) 20 MG tablet Take 20 mg by mouth daily.    Marland Kitchen levocetirizine (XYZAL) 5 MG tablet Take 5 mg by mouth every evening.     Marland Kitchen levocetirizine (XYZAL) 5 MG tablet Take 5 mg by mouth daily.    Marland Kitchen lidocaine (LIDODERM) 5 % Place 1 patch onto the skin daily. Remove & Discard patch within 12 hours or as directed by MD 30 patch 0  . losartan (COZAAR) 50 MG tablet TAKE 1 TABLET DAILY. PLEASE CALL TO SCHEDULE APPOINTMENT FOR FURTHER REFILLS. 30 tablet 0  . losartan (COZAAR) 50 MG tablet Take 25-50 mg by mouth See admin instructions. Take 1 tablet every morning and take 1/2 tablet every evening    . losartan (COZAAR) 50 MG tablet Take 25-50 mg by mouth See admin instructions. Take 60m in the morning and 244min the evening.    . methotrexate (RHEUMATREX) 2.5 MG tablet Take 10 mg by mouth once a week. Caution:Chemotherapy. Protect from light.    . metoprolol succinate (TOPROL XL) 25 MG 24 hr tablet Take 0.5 tablets (12.5 mg total) by mouth at bedtime. 45 tablet 3  . metoprolol succinate (TOPROL-XL) 25 MG 24 hr tablet Take 12.5 mg by mouth daily.     . potassium chloride SA (KLOR-CON M20) 20 MEQ tablet TAKE 1 TABLET BY MOUTH EVERY DAY 90 tablet 0  . predniSONE (DELTASONE) 5 MG tablet Take 5 mg by mouth daily with breakfast.    . predniSONE (STERAPRED UNI-PAK 48 TAB) 5 MG (48) TBPK tablet Take 5 mg by mouth as directed.    . simvastatin (ZOCOR) 20 MG tablet Take 20 mg by mouth at bedtime.    . simvastatin (ZOCOR) 20 MG tablet Take 20 mg by mouth at bedtime.    . simvastatin (ZOCOR) 20 MG tablet Take 20 mg by mouth at bedtime.    . timolol (TIMOPTIC) 0.5 % ophthalmic solution Place 1 drop into both eyes in the morning and at bedtime.    . Marland KitchenIMOLOL MALEATE OP Apply 1 drop to eye daily.    . traMADol (ULTRAM) 50 MG tablet Take 50 mg by mouth daily as needed for moderate pain.   0  . traMADol (ULTRAM) 50 MG tablet Take 50 mg by mouth at bedtime.    . traMADol (ULTRAM) 50 MG tablet Take 50 mg by mouth  every 6 (six) hours as needed for moderate pain.    . Marland Kitchenarfarin (COUMADIN) 5 MG tablet Take 1/2 to 1 tablet by mouth daily as directed by coumadin clinic 90 tablet 0  . warfarin (COUMADIN) 5 MG tablet Take 2.5-5 mg by mouth See admin instructions. Take 1/2 tablet Sunday, Monday, Wednesday and Friday then take 1  tablet all the other days     . warfarin (COUMADIN) 5 MG tablet Take 2.5-5 mg by mouth See admin instructions. Take 48m on Tuesday and Saturday and take 2.524mon all other days.    . Marland Kitchenolpidem (AMBIEN) 5 MG tablet Take 5 mg by mouth at bedtime as needed for sleep.     . Marland Kitchenolpidem (AMBIEN) 5 MG tablet Take 2.5 mg by mouth at bedtime.    . Marland Kitchenolpidem (AMBIEN) 5 MG tablet Take 2.5 mg by mouth at bedtime as needed for sleep.      Allergies: Allergies as of 02/14/2020 - Review Complete 02/08/2020  Allergen Reaction Noted  . Morphine Anaphylaxis 03/30/2016  . Morphine and related Other (See Comments) 01/30/2020  . Morphine and related  02/07/2020  . Sulfa antibiotics Rash 10/21/2017  . Sulfa antibiotics Rash 01/30/2020  . Sulfa antibiotics Rash 02/07/2020   Past Medical History:  Diagnosis Date  . A-fib ([facility]  . Hemorrhoids   . Hyperlipidemia   . Hypertension   . Lung cancer ([facility]   RLL  . Obesity   . Persistent atrial fibrillation ([facility])     Family History: mother had colon cancer   Social History: Lives with husband who also has health problems. Smoked for 30 years,no longer smokes tobacco. No alcohol use. No drug use.  Review of Systems: A complete ROS was negative except as per HPI.   Physical Exam: Blood pressure (!) 160/73, pulse 77, temperature 97.8 F (36.6 C), temperature source Oral, resp. rate 19, height 5' 1"  (1.549 m), weight 99.8 kg, SpO2 95 %. General: awake, alert, lying in bed in NAD HEENT: /AT; conjunctiva normal, EOMI Neck: supple; no JVD CV: irregularly irregular, no m/r/g Pulm: normal work of breathing; lungs CTAB Abd: BS+; abdomen soft,  non-tender, non-distended Ext: No pitting edema  Neuro: cranial nerves II-XII intact, 3/5 strength in BLE, 5/5 strength in BUE; decreased sensation in feet  Skin: warm and dry  Psych: appropriate mood and affect   EKG: personally reviewed my interpretation is afib; no ischemic changes   Assessment & Plan by Problem: Active Problems:   Lung mass  Ms. SmMccorveys a [age]/o female with history of chronic afib on Coumadin, HTN, HFpEF, RA, remote lung cancer s/p resection who presents for progressive lower extremity weakness with recurrent falls and numbness in her hands and feet. On arrival to the ED, she was hemodynamically stable. CMP and CBC were unremarkable. MRIs of thoracic and lumbar spine were obtained in the ED to further evaluate the lower extremity weakness. They did not show any acute findings to explain symptoms. However, an irregular mass in RUL with adenopathy was seen. CT chest was subsequently obtained which showed right apical and suprahilar masses consistent with malignancy with extension into the hilum.   Right lungs mass with high suspicion for malignancy  -patient with history of RLL lung cancer that is s/p resection 20 years and has been in remission. Records are outside of our system so unsure what type of lung cancer this was -she has not had any symptoms of weight loss, cough, or shortness of breath  -she will likely need pulm consult for biopsy followed oncology consult -I do wonder if her weakness that seems to be predominantly proximal as well as sensory neuropathy may be related to a paraneoplastic neurologic syndrome   Progressive weakness -appears to be predominantly proximal muscle weakness in bilateral lower extremities -no findings on MRI to explain symptoms -will rule out  magnesium or phosphorus deficiency -may be paraneoplastic syndrome in the setting of newly found lung cancer as mentioned above  -PT/OT eval   Sensory neuropathy -patient noticed paresthesias  in feet and hands approximately 3 weeks ago -given her longstanding steroid use, will obtain an A1C to see if her symptoms may be related to diabetic neuropathy -her labs do not show an anemia so B12 deficiency seems less likely -will obtain TSH and ESR to rule out other etiologies   Chronic afib -continue home Toprol 12.5 mg for rate control -pharmacy to assist with coumadin dosing while admitted  HTN -will continue home Losartan 25 mg qhs, 50 mg q am   HFpEF -no evidence of volume overload on exam -continue Lasix 40 mg daily   DVT ppx: Coumadin Diet: HH CODE: FULL   Dispo: Admit patient to Inpatient with expected length of stay greater than 2 midnights.  SignedDelice Bison, DO 02/14/2020, 10:47 PM  Pager: [phone] After 5pm on weekdays and 1pm on weekends: On Call pager: [phone]

## 2020-02-14 NOTE — ED Notes (Signed)
Leah Velasquez, daughter, [phone] would like an update when available

## 2020-02-14 NOTE — ED Triage Notes (Signed)
Patient here from home via [facility]. [facility] reports they went out to the patient's home 3 times today to help the patient to get up off of the floor after doing a slide onto the floor due to generalized weakness and not being able to stand properly. [facility] reports this has been getting increasingly worse over the past couple of days. Patient reports increasing knee pain. She has history of BIL knee replacements, AFIB, CHF.  68-80 HR Afib 112/60 96% RA 18 RR 103 CBG  Patient reports she hasn't taken her "fluid pill" in 2 days due to not being able to get to the bathroom quick enough.

## 2020-02-14 NOTE — ED Provider Notes (Signed)
[facility] Emergency Department Provider Note MRN:  [id number]  Arrival date & time: 02/15/20     Chief Complaint   Weakness History of Present Illness   Leah Velasquez is a 79 y.o. year-old female with a history of A. fib on warfarin, history of lung cancer presenting to the ED with chief complaint of weakness.  Multiple falls over the past few weeks, she has trouble explaining it but she is just not as steady on her feet and not as good at walking.  She thinks that her bed is too high up and she has been falling while trying to get off of the bed.  Unable to get up on her own today after falling and sliding down next to the bed.  Denies head trauma or loss of consciousness.  She explains that she keeps falling on her bottom and so she has some mild soreness.  Denies chest pain or shortness of breath, no recent fever or cough or burning with urination.  No abdominal pain.  Symptoms are constant, moderate, no exacerbating or alleviating factors.  Review of Systems  A complete 10 system review of systems was obtained and all systems are negative except as noted in the HPI and PMH.   Patient's Health History    Past Medical History:  Diagnosis Date  . A-fib ([place])   . Hemorrhoids   . Hyperlipidemia   . Hypertension   . Lung cancer ([place]) 2000   RLL  . Obesity   . Persistent atrial fibrillation [facility])     Past Surgical History:  Procedure Laterality Date  . BREAST EXCISIONAL BIOPSY Left    x 2  . BREAST EXCISIONAL BIOPSY Right   . CARDIOVERSION N/A 01/09/2018   Procedure: CARDIOVERSION;  Surgeon: Josue Hector, MD;  Location: [facility] ENDOSCOPY;  Service: Cardiovascular;  Laterality: N/A;  . CARDIOVERSION N/A 03/01/2018   Procedure: CARDIOVERSION;  Surgeon: Sueanne Margarita, MD;  Location: [facility] ENDOSCOPY;  Service: Cardiovascular;  Laterality: N/A;  . KNEE SURGERY    . LUNG LOBECTOMY Right 2000   RLL removed    No family history on file.  Social History    Socioeconomic History  . Marital status: Married    Spouse name: Not on file  . Number of children: Not on file  . Years of education: Not on file  . Highest education level: Not on file  Occupational History  . Not on file  Tobacco Use  . Smoking status: Never Smoker  . Smokeless tobacco: Never Used  Vaping Use  . Vaping Use: Never used  Substance and Sexual Activity  . Alcohol use: No  . Drug use: No  . Sexual activity: Not on file  Other Topics Concern  . Not on file  Social History Narrative   ** Merged History Encounter **       Social Determinants of Health   Financial Resource Strain:   . Difficulty of Paying Living Expenses:   Food Insecurity:   . Worried About Charity fundraiser in the Last Year:   . Arboriculturist in the Last Year:   Transportation Needs:   . Film/video editor (Medical):   Marland Kitchen Lack of Transportation (Non-Medical):   Physical Activity:   . Days of Exercise per Week:   . Minutes of Exercise per Session:   Stress:   . Feeling of Stress :   Social Connections:   . Frequency of Communication with Friends and Family:   .  Frequency of Social Gatherings with Friends and Family:   . Attends Religious Services:   . Active Member of Clubs or Organizations:   . Attends Archivist Meetings:   Marland Kitchen Marital Status:   Intimate Partner Violence:   . Fear of Current or Ex-Partner:   . Emotionally Abused:   Marland Kitchen Physically Abused:   . Sexually Abused:      Physical Exam   Vitals:   02/14/20 2051 02/15/20 0017  BP: (!) 160/73 (!) 142/103  Pulse: 77 87  Resp: 19 20  Temp:  97.7 F (36.5 C)  SpO2: 95% 96%    CONSTITUTIONAL: Well-appearing, NAD NEURO:  Alert and oriented x 3, decreased sensation to bilateral feet, unclear if poor effort or bilateral lower extremity weakness EYES:  eyes equal and reactive ENT/NECK:  no LAD, no JVD CARDIO: Regular rate, well-perfused, normal S1 and S2 PULM:  CTAB no wheezing or rhonchi GI/GU:  normal  bowel sounds, non-distended, non-tender MSK/SPINE:  No gross deformities, no edema SKIN:  no rash, atraumatic PSYCH:  Appropriate speech and behavior  *Additional and/or pertinent findings included in MDM below  Diagnostic and Interventional Summary    EKG Interpretation  Date/Time:  Friday February 14 2020 15:41:56 EDT Ventricular Rate:  85 PR Interval:    QRS Duration: 79 QT Interval:  355 QTC Calculation: 423 R Axis:   14 Text Interpretation: Atrial fibrillation Ventricular premature complex Abnormal R-wave progression, early transition Borderline T abnormalities, anterior leads Confirmed by Gerlene Fee [phone]) on 02/14/2020 3:43:45 PM      Labs Reviewed  COMPREHENSIVE METABOLIC PANEL - Abnormal; Notable for the following components:      Result Value   Total Protein 5.7 (*)    Albumin 3.2 (*)    All other components within normal limits  URINALYSIS, ROUTINE W REFLEX MICROSCOPIC - Abnormal; Notable for the following components:   Hgb urine dipstick SMALL (*)    Bacteria, UA RARE (*)    All other components within normal limits  PROTIME-INR - Abnormal; Notable for the following components:   Prothrombin Time 25.8 (*)    INR 2.5 (*)    All other components within normal limits  SARS CORONAVIRUS 2 BY RT PCR (HOSPITAL ORDER, [facility])  CBC  CK  TSH  SEDIMENTATION RATE  MAGNESIUM  PHOSPHORUS  BASIC METABOLIC PANEL  CBC  PROTIME-INR  CORTISOL-AM, BLOOD  HEMOGLOBIN A1C  BRAIN NATRIURETIC PEPTIDE    CT CHEST W CONTRAST  Final Result    MR LUMBAR SPINE WO CONTRAST  Final Result    MR THORACIC SPINE WO CONTRAST  Final Result      Medications  leflunomide (ARAVA) tablet 20 mg (has no administration in time range)  traMADol (ULTRAM) tablet 50 mg (has no administration in time range)  furosemide (LASIX) tablet 40 mg (has no administration in time range)  losartan (COZAAR) tablet 25 mg (has no administration in time range)  metoprolol  succinate (TOPROL-XL) 24 hr tablet 12.5 mg (has no administration in time range)  simvastatin (ZOCOR) tablet 20 mg (has no administration in time range)  zolpidem (AMBIEN) tablet 5 mg (has no administration in time range)  calcium carbonate (TUMS - dosed in mg elemental calcium) chewable tablet 750 mg (has no administration in time range)  pantoprazole (PROTONIX) EC tablet 40 mg (has no administration in time range)  conjugated estrogens (PREMARIN) vaginal cream 1 Applicatorful (has no administration in time range)  cholecalciferol (VITAMIN D3) tablet 1,000  Units (has no administration in time range)  potassium chloride SA (KLOR-CON) CR tablet 40 mEq (has no administration in time range)  albuterol (PROVENTIL) (2.5 MG/3ML) 0.083% nebulizer solution 3 mL (has no administration in time range)  losartan (COZAAR) tablet 50 mg (has no administration in time range)  Warfarin - Pharmacist Dosing Inpatient (has no administration in time range)  warfarin (COUMADIN) tablet 2.5 mg (has no administration in time range)  traMADol (ULTRAM) tablet 50 mg (50 mg Oral Given 02/14/20 2046)  iohexol (OMNIPAQUE) 300 MG/ML solution 75 mL (75 mLs Intravenous Contrast Given 02/14/20 2028)     Procedures  /  Critical Care Procedures  ED Course and Medical Decision Making  I have reviewed the triage vital signs, the nursing notes, and pertinent available records from the EMR.  Listed above are laboratory and imaging tests that I personally ordered, reviewed, and interpreted and then considered in my medical decision making (see below for details).      Multiple falls and ED visits over the past month, history of cancer, history of anticoagulation, difficult to ascertain if patient truly has leg weakness or just has poor effort, but she does have decreased sensation to her bilateral feet.  No documented history of neuropathy, denies history of diabetes.  States that this is a relatively new problem to her feet.  Will  obtain MRI imaging of the spine to exclude myelopathy or neoplastic process or epidural hematoma.  MRI imaging does not reveal myelopathy or radiculopathy or any obvious reason for patient's lower extremity weakness.  I suspect it is related to deconditioning, and chronic worsening of orthopedic injuries in the past.  She also has some obvious neuropathy on exam that is still without obvious etiology that should be further evaluated.  The T-spine MRI has shown evidence of recurrence of patient's lung cancer, will admit to hospital for further management.  Barth Kirks. Sedonia Small, [place] [email or web address]  Final Clinical Impressions(s) / ED Diagnoses     ICD-10-CM   1. Frequent falls  R29.6   2. Lung mass  R91.8     ED Discharge Orders    None       Discharge Instructions Discussed with and Provided to Patient:   Discharge Instructions   None       Maudie Flakes, MD 02/15/20 [phone]

## 2020-02-14 NOTE — Telephone Encounter (Signed)
Patient called and said she lost the dosage instructions for how to take her Warfarin. Please call the patient to give dosage instruction

## 2020-02-14 NOTE — Telephone Encounter (Signed)
Called patient - husband answered, stated wife has gone to ED this afternoon

## 2020-02-14 NOTE — Telephone Encounter (Signed)
Spoke with husband, patient is currently admitted.

## 2020-02-14 NOTE — Progress Notes (Signed)
Updated patient's daughter Debby. She is an Therapist, sports and will update her father/other family members.

## 2020-02-14 NOTE — ED Notes (Signed)
Attempted to collect INR lab from patient with no success. Will contact lab to collect.

## 2020-02-14 NOTE — Progress Notes (Signed)
ANTICOAGULATION CONSULT NOTE - Initial Consult  Pharmacy Consult for Warfarin Indication: atrial fibrillation  Allergies  Allergen Reactions  . Morphine Anaphylaxis  . Morphine And Related Other (See Comments)    "vitals go down"  . Morphine And Related     Pass out   . Sulfa Antibiotics Rash  . Sulfa Antibiotics Rash  . Sulfa Antibiotics Rash    Patient Measurements: Height: 5\' 1"  (154.9 cm) Weight: 99.8 kg (220 lb) IBW/kg (Calculated) : 47.8  Vital Signs: Temp: 97.8 F (36.6 C) (06/25 1602) Temp Source: Oral (06/25 1602) BP: 160/73 (06/25 2051) Pulse Rate: 77 (06/25 2051)  Labs: Recent Labs    02/14/20 1644 02/14/20 1857  HGB 14.3  --   HCT 44.1  --   PLT 173  --   LABPROT  --  25.8*  INR  --  2.5*  CREATININE 0.81  --     Estimated Creatinine Clearance: 63 mL/min (by C-G formula based on SCr of 0.81 mg/dL).   Medical History: Past Medical History:  Diagnosis Date  . A-fib ([place])   . Hemorrhoids   . Hyperlipidemia   . Hypertension   . Lung cancer ([place]) 2000   RLL  . Obesity   . Persistent atrial fibrillation ([facility])     Medications:  (Not in a hospital admission)   Assessment: 42 YOF with h/o Afib on warfarin admitted for progressive weakness. INR on admission is therapeutic at 2.5. H/H and Plt wnl. Last dose of warfarin was on 6/23.   Home warfarin dose: 5 mg on Saturday; 2.5 mg on all other days   Goal of Therapy:  INR 2-3 Monitor platelets by anticoagulation protocol: Yes   Plan:  -Warfarin 2.5 mg now  -Daily PT/INR -Monitor for bleeding   Albertina Parr, PharmD., BCPS, BCCCP Clinical Pharmacist Clinical phone for 02/14/20 until 11:30pm: [phone] If after 11:30pm, please refer to [facility] for unit-specific pharmacist

## 2020-02-15 ENCOUNTER — Encounter (HOSPITAL_COMMUNITY): Payer: Self-pay | Admitting: Internal Medicine

## 2020-02-15 ENCOUNTER — Inpatient Hospital Stay (HOSPITAL_COMMUNITY): Payer: Medicare HMO

## 2020-02-15 DIAGNOSIS — R202 Paresthesia of skin: Secondary | ICD-10-CM

## 2020-02-15 DIAGNOSIS — R296 Repeated falls: Secondary | ICD-10-CM

## 2020-02-15 DIAGNOSIS — R918 Other nonspecific abnormal finding of lung field: Principal | ICD-10-CM

## 2020-02-15 LAB — CBC
HCT: 44.1 % (ref 36.0–46.0)
Hemoglobin: 14.3 g/dL (ref 12.0–15.0)
MCH: 31.5 pg (ref 26.0–34.0)
MCHC: 32.4 g/dL (ref 30.0–36.0)
MCV: 97.1 fL (ref 80.0–100.0)
Platelets: 160 10*3/uL (ref 150–400)
RBC: 4.54 MIL/uL (ref 3.87–5.11)
RDW: 14.6 % (ref 11.5–15.5)
WBC: 7.9 10*3/uL (ref 4.0–10.5)
nRBC: 0 % (ref 0.0–0.2)

## 2020-02-15 LAB — HEMOGLOBIN A1C
Hgb A1c MFr Bld: 5.7 % — ABNORMAL HIGH (ref 4.8–5.6)
Mean Plasma Glucose: 116.89 mg/dL

## 2020-02-15 LAB — BASIC METABOLIC PANEL
Anion gap: 13 (ref 5–15)
BUN: 12 mg/dL (ref 8–23)
CO2: 23 mmol/L (ref 22–32)
Calcium: 9 mg/dL (ref 8.9–10.3)
Chloride: 102 mmol/L (ref 98–111)
Creatinine, Ser: 0.82 mg/dL (ref 0.44–1.00)
GFR calc Af Amer: >60 mL/min (ref >60)
GFR calc non Af Amer: >60 mL/min (ref >60)
Glucose, Bld: 99 mg/dL (ref 70–99)
Potassium: 3.5 mmol/L (ref 3.5–5.1)
Sodium: 138 mmol/L (ref 135–145)

## 2020-02-15 LAB — BRAIN NATRIURETIC PEPTIDE: B Natriuretic Peptide: 90.6 pg/mL (ref 0.0–100.0)

## 2020-02-15 LAB — SEDIMENTATION RATE: Sed Rate: 12 mm/hr (ref 0–22)

## 2020-02-15 LAB — CORTISOL-AM, BLOOD
Cortisol - AM: 20.5 ug/dL (ref 6.7–22.6)
Cortisol - AM: 23.2 ug/dL — ABNORMAL HIGH (ref 6.7–22.6)

## 2020-02-15 LAB — CK: Total CK: 35 U/L — ABNORMAL LOW (ref 38–234)

## 2020-02-15 LAB — PROTIME-INR
INR: 2.1 — ABNORMAL HIGH (ref 0.8–1.2)
Prothrombin Time: 22.5 seconds — ABNORMAL HIGH (ref 11.4–15.2)

## 2020-02-15 LAB — MAGNESIUM: Magnesium: 1.9 mg/dL (ref 1.7–2.4)

## 2020-02-15 LAB — TSH: TSH: 2.458 u[IU]/mL (ref 0.350–4.500)

## 2020-02-15 LAB — PHOSPHORUS: Phosphorus: 3 mg/dL (ref 2.5–4.6)

## 2020-02-15 LAB — VITAMIN B12: Vitamin B-12: 173 pg/mL — ABNORMAL LOW (ref 180–914)

## 2020-02-15 IMAGING — MR MR HEAD WO/W CM
14 of 16 series · 40 of 48 positions shown · IV contrast (gadavist)
Comparison: None.

CLINICAL DATA: Multiple recent falls, confusion, memory loss

EXAM:
MRI HEAD WITHOUT AND WITH CONTRAST
TECHNIQUE: Multiplanar, multiecho pulse sequences of the brain and surrounding
structures were obtained without and with intravenous contrast.
CONTRAST:  10mL GADAVIST GADOBUTROL 1 MMOL/ML IV SOLN

[Series 9: DWI · axial · 3.0mm · 0.88mm/px · z∈[-114,+24]mm · 6 of 96 slices shown (1 of 4)]
[im 1/96]
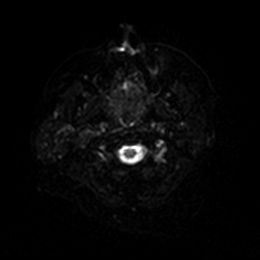
[im 20/96]
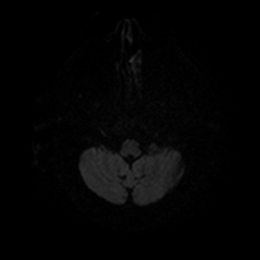
[im 39/96]
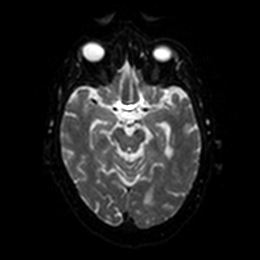
[im 58/96]
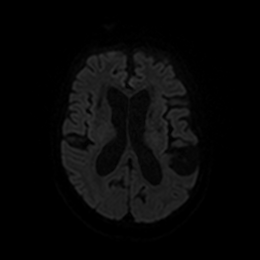
[im 77/96]
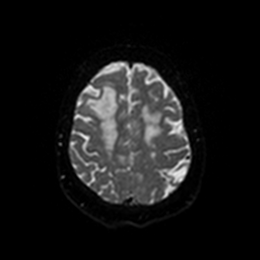
[im 96/96]
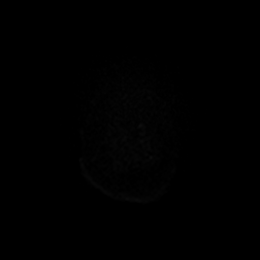

[Series 10: DWI · axial · 3.0mm · 0.88mm/px · z∈[-114,+24]mm · 2 of 48 slices shown (2 of 4)]
[im 1/48]
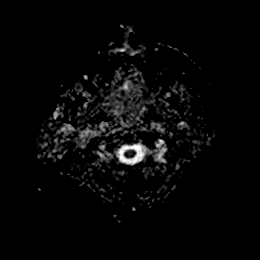
[im 48/48]
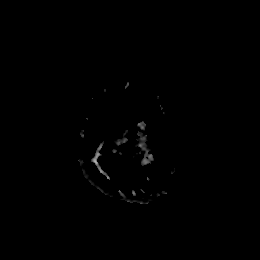

[Series 11: DWI · coronal · 4.0mm · 0.88mm/px · 4 of 68 slices shown (3 of 4)]
[im 1/68]
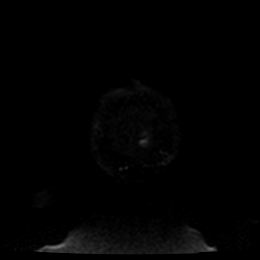
[im 23/68]
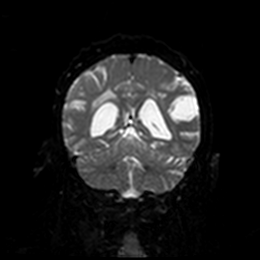
[im 45/68]
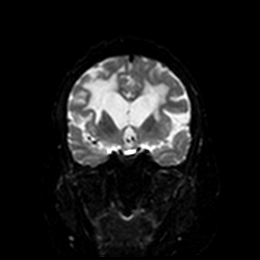
[im 68/68]
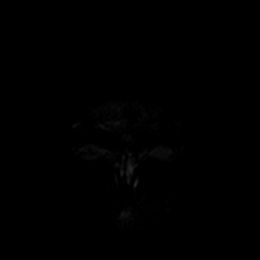

[Series 12: DWI · coronal · 4.0mm · 0.88mm/px · 1 of 34 slices shown (4 of 4)]
[im 1/34]
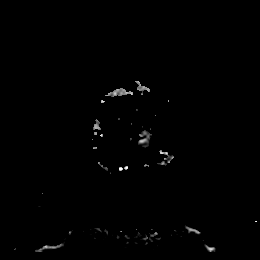

[Series 13: T1 · sagittal · 5.0mm · 0.75mm/px · 2 of 23 slices shown]
[im 1/23]
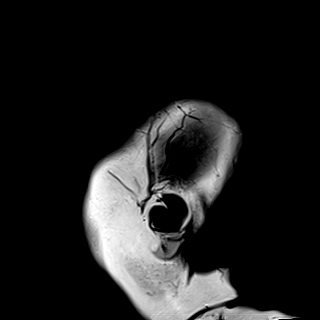
[im 23/23]
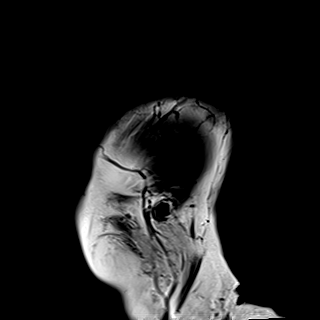

[Series 14: T2 · axial · 5.0mm · 0.72mm/px · z∈[-116,+25]mm · 2 of 25 slices shown]
[im 1/25]
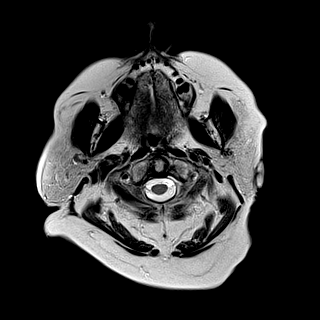
[im 25/25]
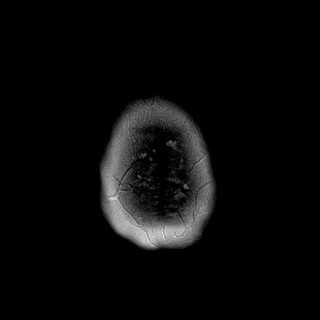

[Series 15: FLAIR · axial · 5.0mm · 0.45mm/px · z∈[-117,+24]mm · 2 of 25 slices shown]
[im 1/25]
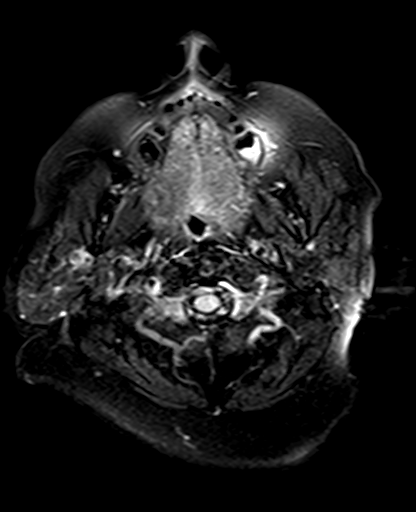
[im 25/25]
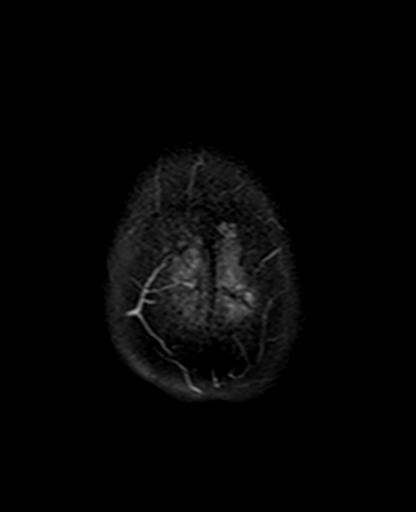

[Series 16: mag_images · axial · 3.0mm · 0.90mm/px · z∈[-123,+27]mm · 4 of 52 slices shown]
[im 1/52]
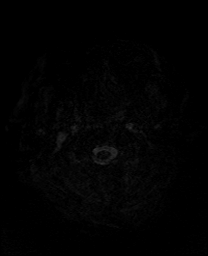
[im 18/52]
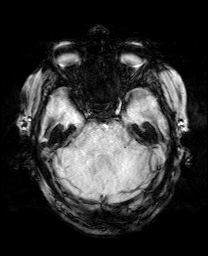
[im 35/52]
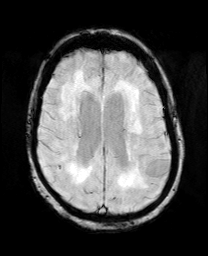
[im 52/52]
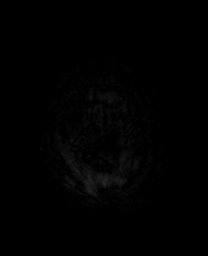

[Series 17: pha_images · axial · 3.0mm · 0.90mm/px · z∈[-123,+27]mm · 4 of 52 slices shown]
[im 1/52]
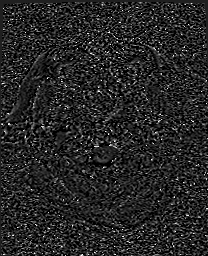
[im 18/52]
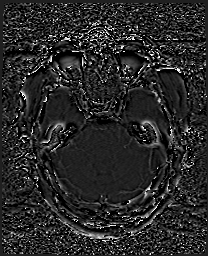
[im 35/52]
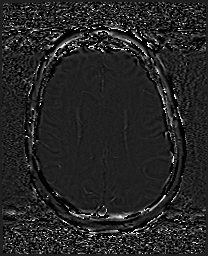
[im 52/52]
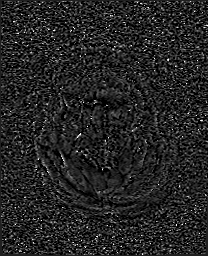

[Series 18: swi_images · axial · 3.0mm · 0.90mm/px · z∈[-123,+27]mm · 4 of 52 slices shown]
[im 1/52]
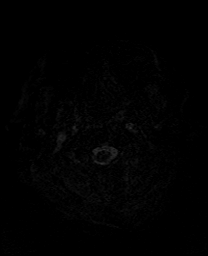
[im 18/52]
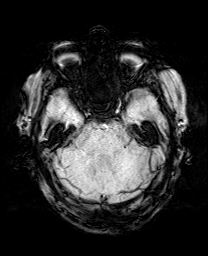
[im 35/52]
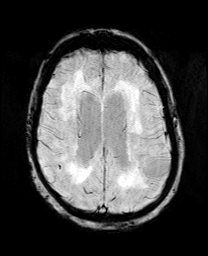
[im 52/52]
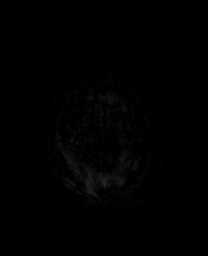

[Series 19: mip_images(sw) · axial · 24.0mm · 0.90mm/px · z∈[-113,+17]mm · 3 of 45 slices shown]
[im 1/45]
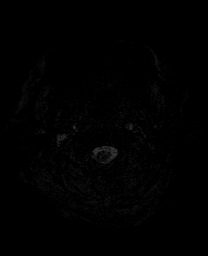
[im 23/45]
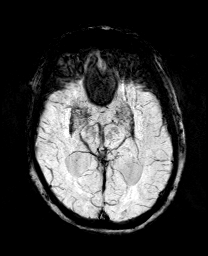
[im 45/45]
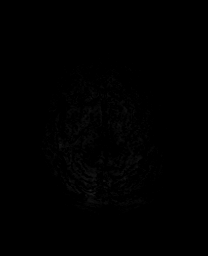

[Series 21: T2 post-contrast · coronal · 5.0mm · 0.72mm/px · 2 of 28 slices shown]
[im 1/28]
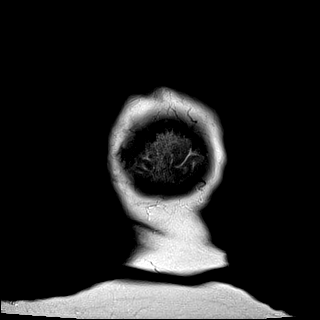
[im 28/28]
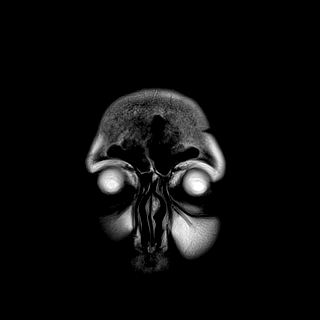

[Series 23: T1 post-contrast · coronal · 5.0mm · 0.34mm/px · 2 of 28 slices shown (1 of 2)]
[im 1/28]
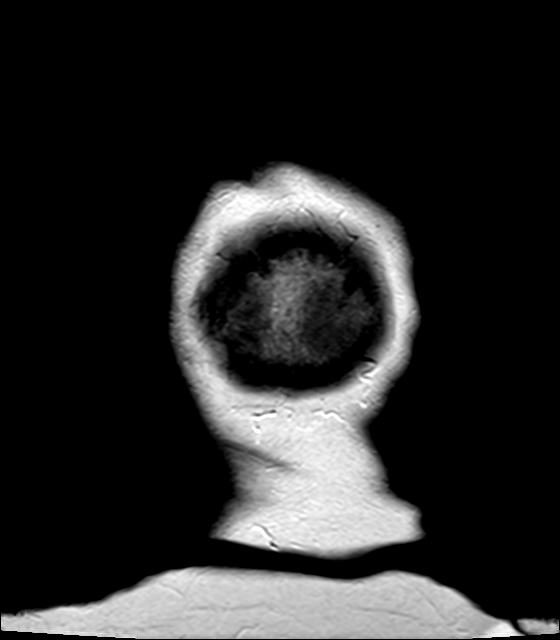
[im 28/28]
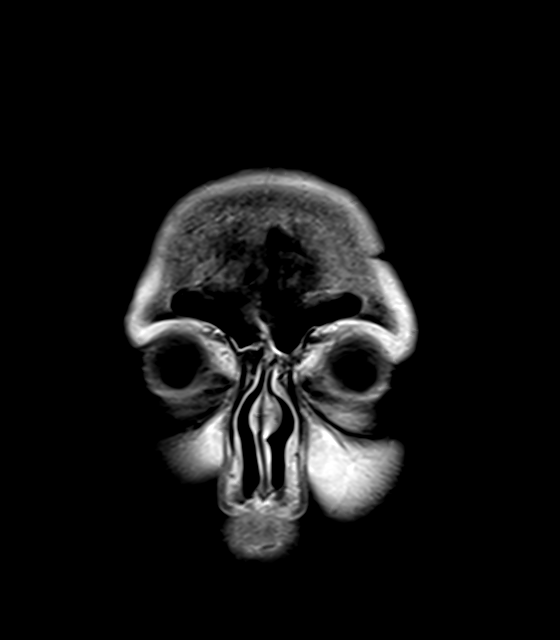

[Series 24: T1 post-contrast · sagittal · 5.0mm · 0.72mm/px · 2 of 23 slices shown (2 of 2)]
[im 1/23]
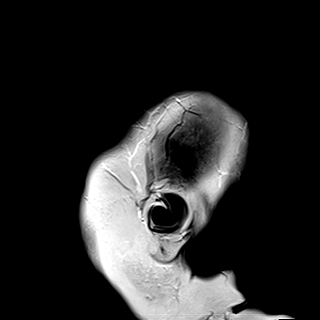
[im 23/23]
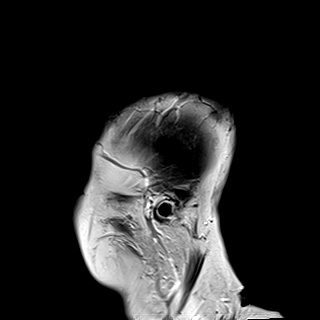

[40 of 48 positions shown; findings below may reference images not displayed]

FINDINGS: Brain: There is no acute infarction or intracranial hemorrhage.
There is no intracranial mass, mass effect, or edema. There is no
hydrocephalus or extra-axial fluid collection. Prominence of the
ventricles and sulci reflects parenchymal volume loss. Chronic
infarcts of the right frontal lobe, left parietal lobe, and central
white matter and basal ganglia. Additional patchy and confluent T2
hyperintensity in the supratentorial white matter is nonspecific but
may reflect moderate to advanced chronic microvascular ischemic
changes. Ex vacuo dilatation of the lateral ventricles. No abnormal
enhancement.

Vascular: Major vessel flow voids at the skull base are preserved.

Skull and upper cervical spine: Normal marrow signal is preserved.

Sinuses/Orbits: Paranasal sinuses are aerated. Orbits are
unremarkable.

Other: Sella is unremarkable.  Mastoid air cells are clear.
IMPRESSION: No evidence of recent infarction, hemorrhage, or mass.

Multiple chronic infarcts. Moderate to advanced chronic
microvascular ischemic changes.

## 2020-02-15 IMAGING — CR DG SACRUM/COCCYX 2+V
3 series · 3 of 3 positions shown · non-contrast
Comparison: None.

CLINICAL DATA: Fall with sacrococcygeal pain.

EXAM:
SACRUM AND COCCYX - 2+ VIEW

[coccyx ap]
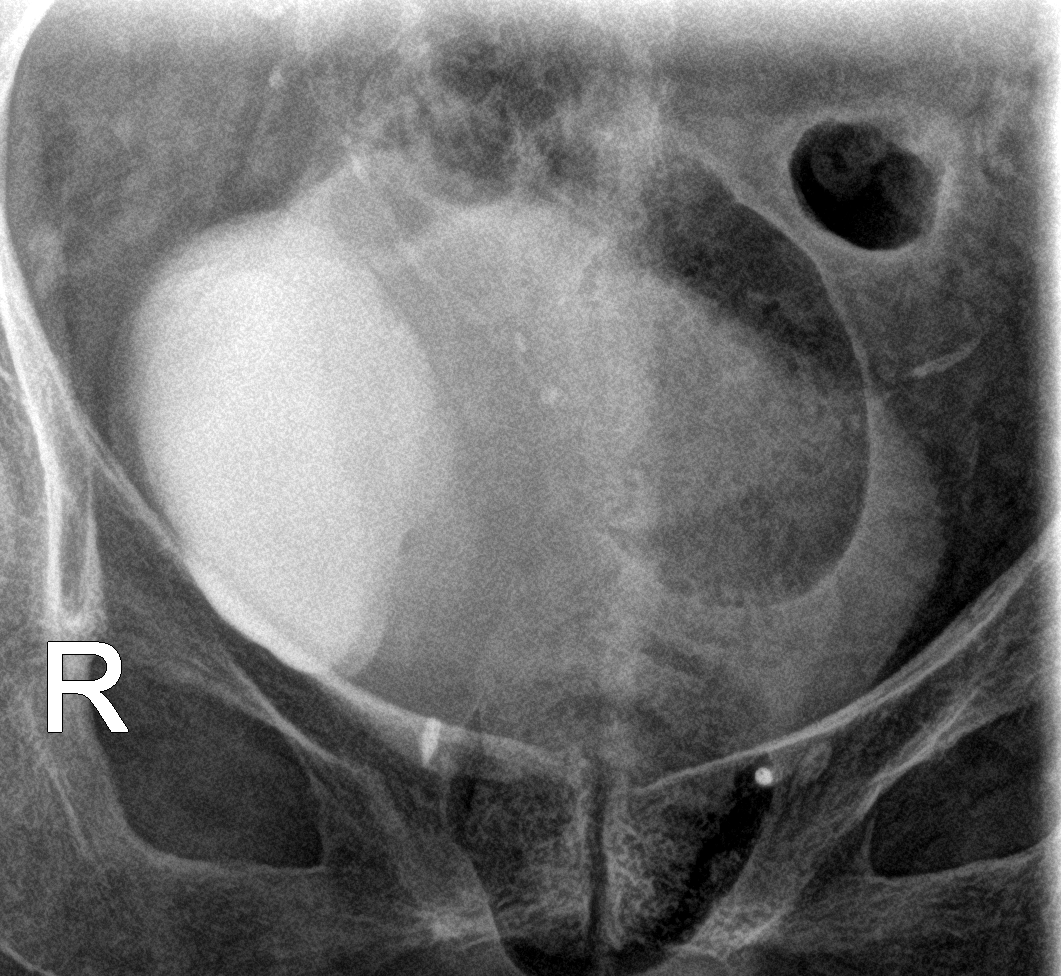

[sacrum ap]
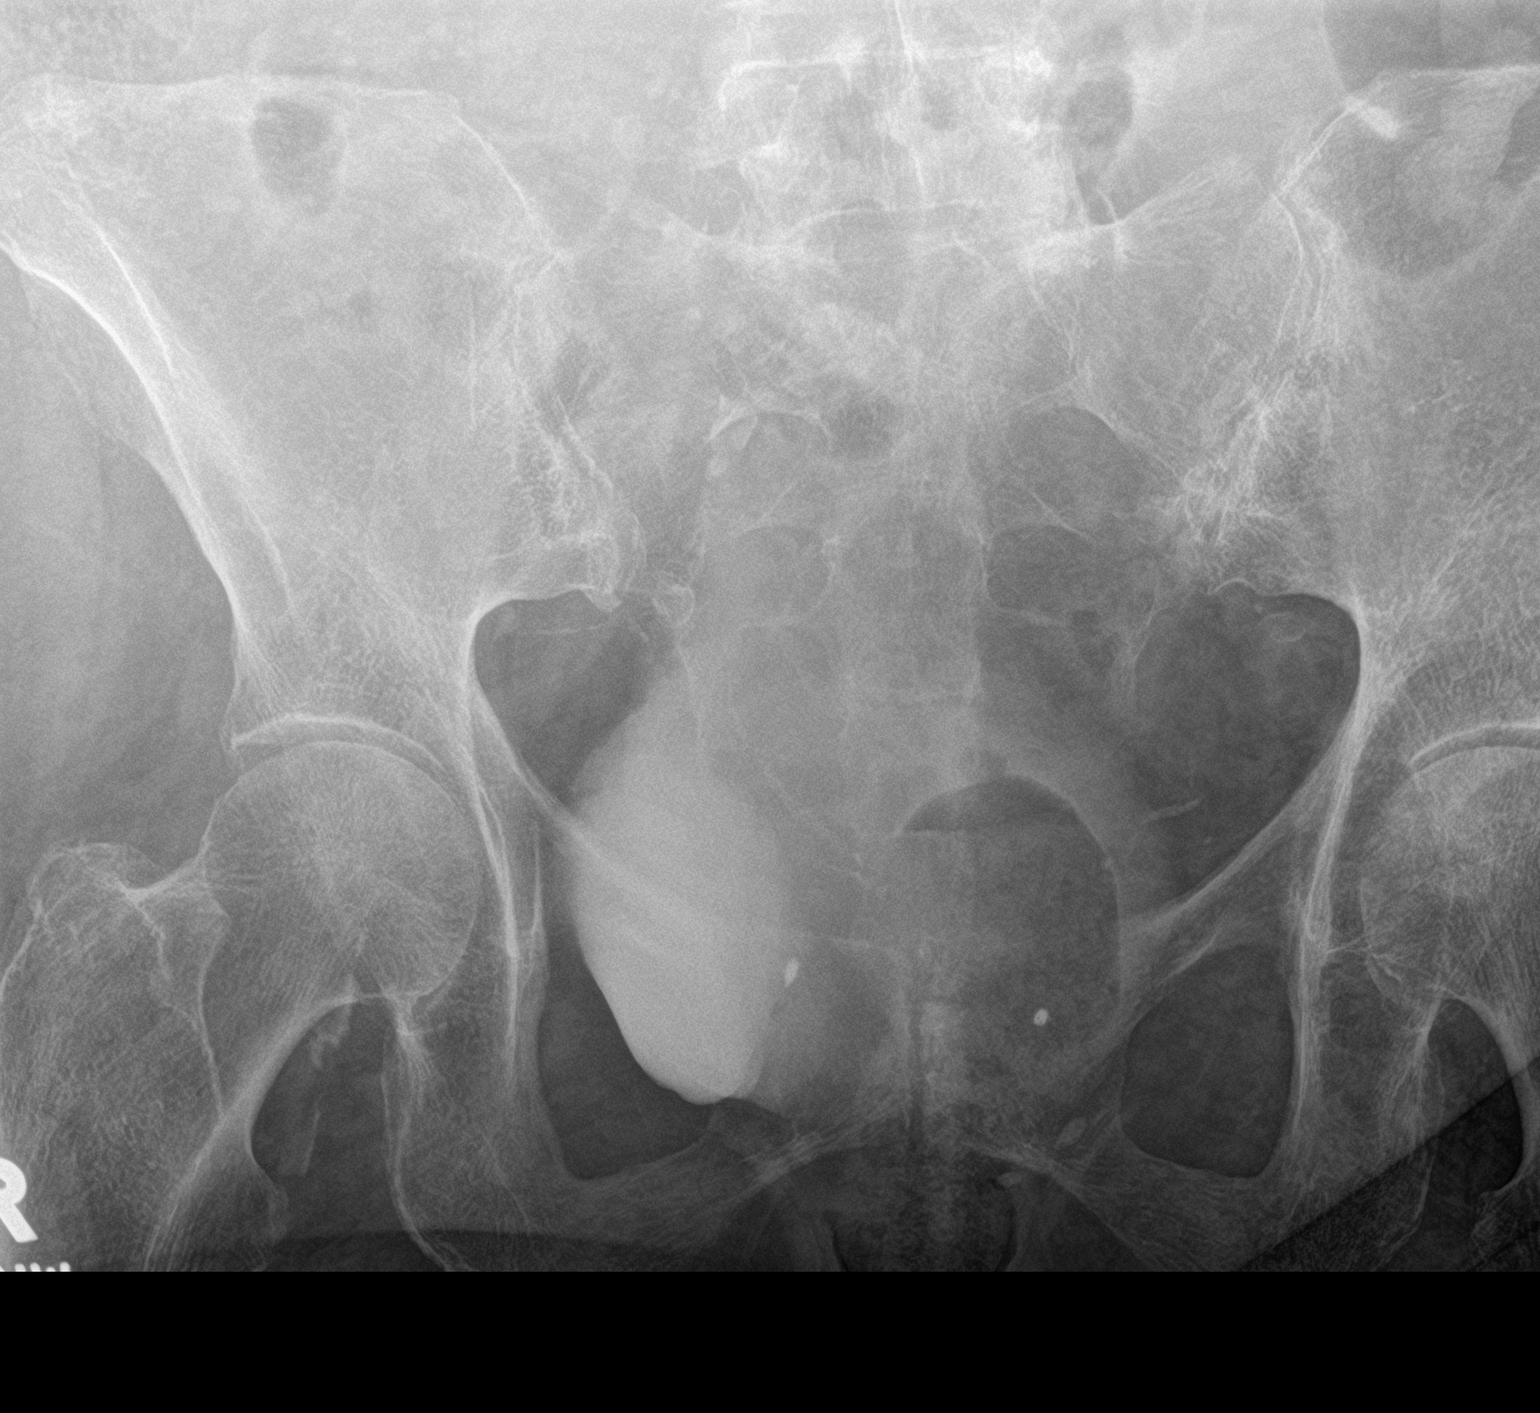

[sacrum lat]
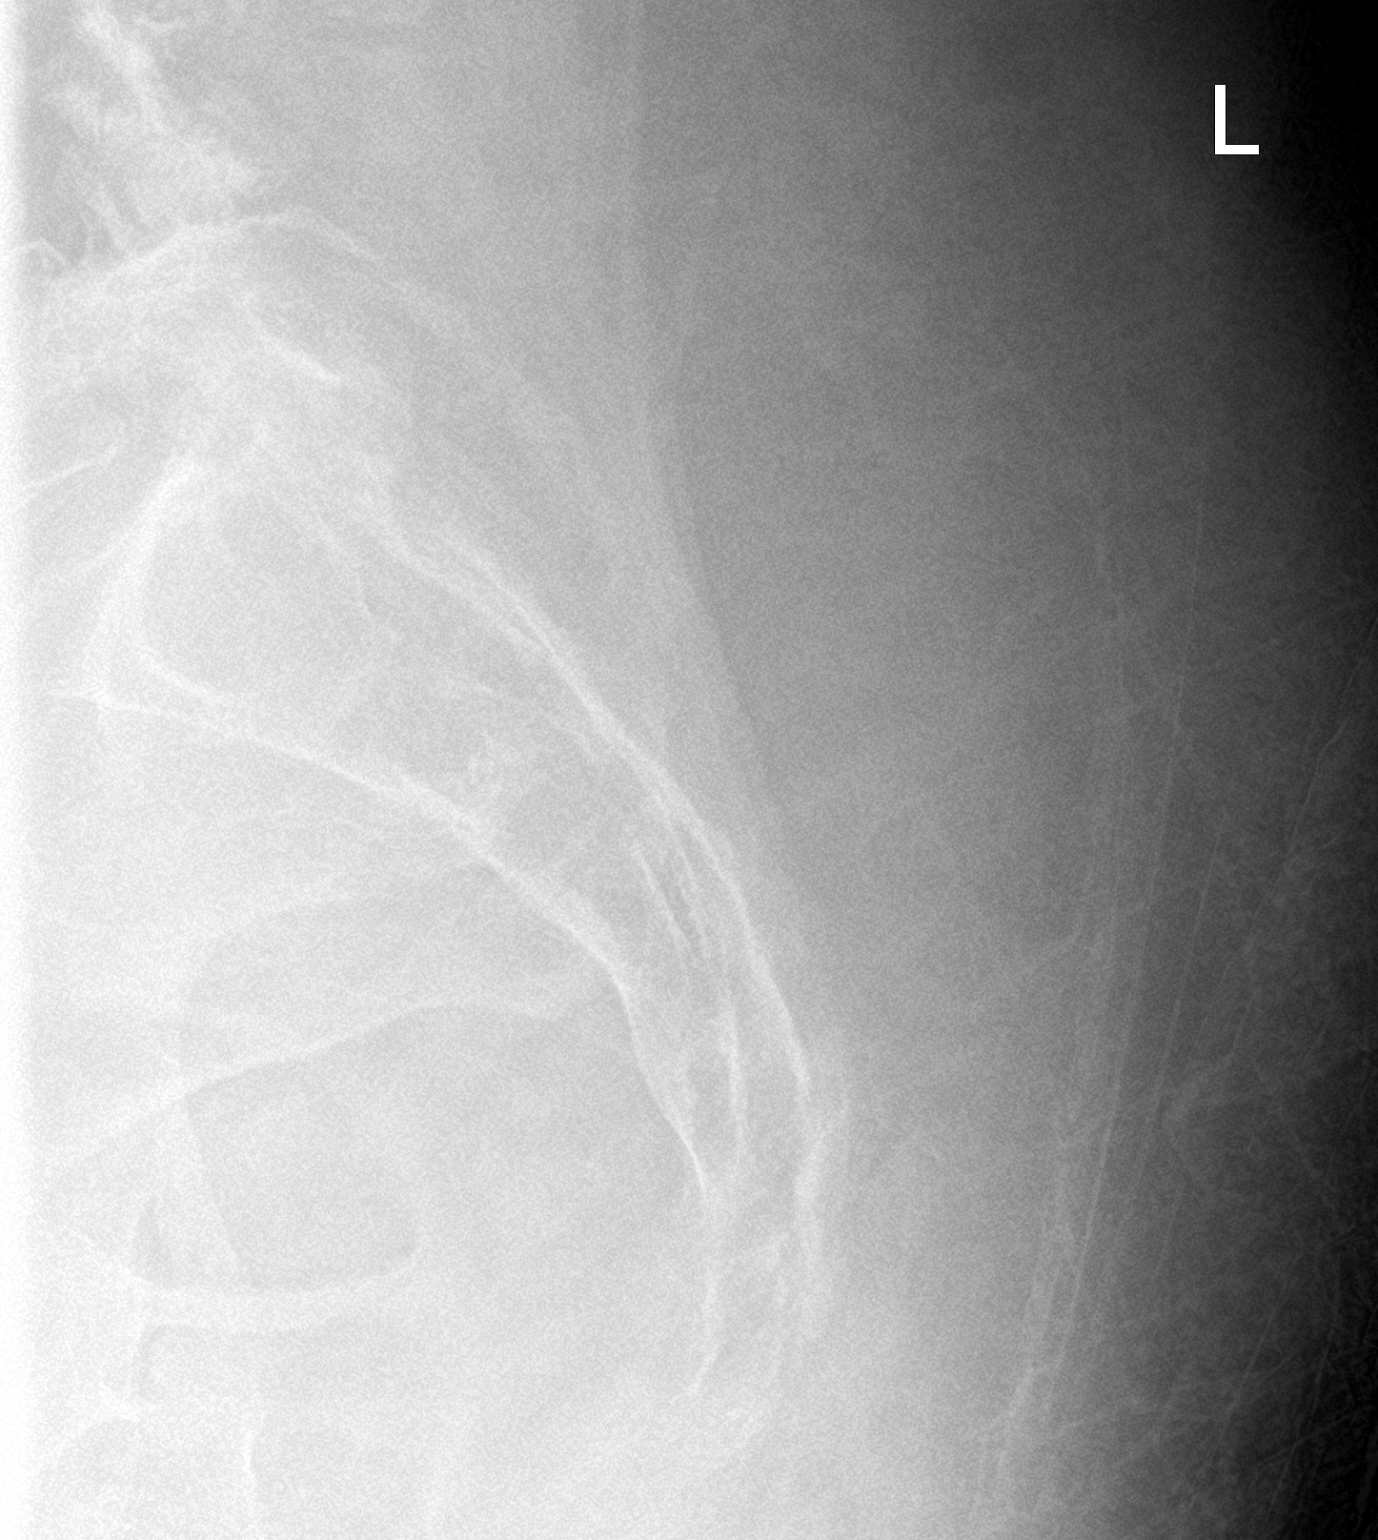

[3 of 3 positions shown; findings below may reference images not displayed]

FINDINGS: Mild diffuse decreased bone mineralization. No evidence of acute
fracture. Mild degenerate change of the sacroiliac joints, spine and
hips. Contrast is present within the bladder
IMPRESSION: No acute fracture.

## 2020-02-15 MED ORDER — FUROSEMIDE 20 MG PO TABS
20.0000 mg | ORAL_TABLET | Freq: Every day | ORAL | Status: DC
  Administered 2020-02-16 – 2020-02-17 (×2): 20 mg via ORAL
  Filled 2020-02-15 (×2): qty 1

## 2020-02-15 MED ORDER — WARFARIN SODIUM 5 MG PO TABS: 5.0000 mg | ORAL_TABLET | ORAL | Status: DC

## 2020-02-15 MED ORDER — GADOBUTROL 1 MMOL/ML IV SOLN
10.0000 mL | Freq: Once | INTRAVENOUS | Status: AC | PRN
  Administered 2020-02-15: 10 mL via INTRAVENOUS

## 2020-02-15 MED ORDER — POTASSIUM CHLORIDE CRYS ER 20 MEQ PO TBCR: 40.0000 meq | EXTENDED_RELEASE_TABLET | Freq: Once | ORAL | Status: DC

## 2020-02-15 MED ORDER — WARFARIN SODIUM 2.5 MG PO TABS: 2.5000 mg | ORAL_TABLET | ORAL | Status: DC

## 2020-02-15 NOTE — Progress Notes (Signed)
ANTICOAGULATION CONSULT NOTE - Follow Up Consult  Pharmacy Consult for Coumadin Indication: h/o afib  Allergies  Allergen Reactions  . Morphine Anaphylaxis  . Morphine And Related Other (See Comments)    "vitals go down"  . Morphine And Related     Pass out   . Sulfa Antibiotics Rash  . Sulfa Antibiotics Rash  . Sulfa Antibiotics Rash    Patient Measurements: Height: 5\' 1"  (154.9 cm) Weight: (P) 106.3 kg (234 lb 5.6 oz) IBW/kg (Calculated) : 47.8  Vital Signs: Temp: 98.1 F (36.7 C) (06/26 0456) Temp Source: Oral (06/26 0456) BP: 129/80 (06/26 0456) Pulse Rate: 92 (06/26 0456)  Labs: Recent Labs    02/14/20 1644 02/14/20 1857 02/15/20 0121 02/15/20 0124  HGB 14.3  --   --  14.3  HCT 44.1  --   --  44.1  PLT 173  --   --  160  LABPROT  --  25.8*  --  22.5*  INR  --  2.5*  --  2.1*  CREATININE 0.81  --  0.82  --   CKTOTAL  --   --  35*  --     Estimated Creatinine Clearance: 62.2 mL/min (by C-G formula based on SCr of 0.82 mg/dL).  Assessment: CC/HPI: 1 month history of progressive lower extremity weakness, recurrent falls, as well as numbness in her feet and hands.  - CT chest was subsequently obtained which showed right apical and suprahilar masses consistent with malignancy with extension into the hilum.   PMH: chronic afib on Coumadin, HTN, HFpEF, remote history of lung cancer s/p resection 20 years ago, RA, hemorrhoids, HLD, obesity,  Anticoag: Warfarin for Afib, INR 2.5 on admission. Recently elevated at 4.6 on 6/21 at coumadin clinic. Hgb WNL. INR 2.1 today. - PTA warfarin: 2.5mg  MWFSun, 5mg  TThSat  Goal of Therapy:  INR 2-3 Monitor platelets by anticoagulation protocol: Yes   Plan:  -Resume home Coumadin regimen -Daily PT/INR -Oncology consult - Resume home meds?? Lidocaine patch, Flonase, Leflunomide, Xyzal, Timolol,    Tira Lafferty S. Alford Highland, PharmD, BCPS Clinical Staff Pharmacist [email or web address] Alford Highland, [place]ayah Pauli Stillinger 02/15/2020,7:44  AM

## 2020-02-15 NOTE — Plan of Care (Signed)
  Problem: Clinical Measurements: Goal: Will remain free from infection Outcome: Adequate for Discharge   

## 2020-02-15 NOTE — Consult Note (Signed)
NAME:  Leah Velasquez, MRN:  [id number], DOB:  [birth year], LOS: 1 ADMISSION DATE:  02/14/2020, CONSULTATION DATE:  02/15/20 REFERRING MD:  Gilles Chiquito, CHIEF COMPLAINT:  RUL lung mass   Brief History   RUL lung mass. Hx of RA on arava, prednisone and A Fib on Coumadin. 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  She is status post bilateral knee replacement with the right one in 2011 and the left one in 2014.  For the last 4 to 5 years she has been on chronic prednisonebecause of bew dx of  rheumatoid arthritis [follows up with Dr. Levada Dy Hawkes] with a new diagnosis of rheumatoid arthritis around this time.  In 2019 apparently was admitted for pneumonia and influenza and atrial fibrillation and was hospitalized for over a week.  After that has been using a walker and also has been on warfarin.  She also has been on on methotrexate for her rheumatoid arthritis but for the last 1-2 years has been on [place].  In the last few weeks she has had increased bilateral ankle pain which is caused some unsteadiness in her feet.  She was given Solu-Medrol.  Then just prior to admission she thinks he slipped and fell in her carpet.  Did not lose any consciousness.  Because of this she was admitted.  MRI of the thoracic and lumbar spine has been noncontributory but showed a right upper lobe mass.  This was followed up with a CT scan of the chest  1. Right apical subpleural and right suprahilar masses consistent with malignancy. There is extension of this mass into the right hilum. 2. Right mediastinal soft tissue mass or abnormal lymph node with extension to the right hilum. Bronchoscopy may provide better evaluation. 3. A cluster of nodular density in the right upper lobe  extending to the right lateral pleural surface and along the right minor fissure most consistent with satellite nodules. 4. Aortic Atherosclerosis (ICD10-I70.0) and Emphysema (I  On coumadni at home Not on aspirinl or plavix   Past Medical History       has a past medical history of A-fib ([place]), Hemorrhoids, Hyperlipidemia, Hypertension, Lung cancer ([place]) (2000), Obesity, Persistent atrial fibrillation ([place]), and Rheumatoid arthritis ([place]).   reports that she has never smoked. She has never used smokeless tobacco.  Past Surgical History:  Procedure Laterality Date  . BREAST EXCISIONAL BIOPSY Left    x 2  . BREAST EXCISIONAL BIOPSY Right   . CARDIOVERSION N/A 01/09/2018   Procedure: CARDIOVERSION;  Surgeon: Josue Hector, MD;  Location[facility] ENDOSCOPY;  Service: Cardiovascular;  Laterality: N/A;  . CARDIOVERSION N/A 03/01/2018   Procedure: CARDIOVERSION;  Surgeon: Sueanne Margarita, MD;  Location: [facility] ENDOSCOPY;  Service: Cardiovascular;  Laterality: N/A;  . KNEE SURGERY    . LUNG LOBECTOMY Right 2000   RLL removed    Allergies  Allergen Reactions  . Morphine Anaphylaxis  . Morphine And Related Other (See Comments)    "vitals go down"  . Morphine And Related     Pass out   . Sulfa Antibiotics Rash  . Sulfa Antibiotics Rash  . Sulfa Antibiotics Rash     There is no immunization history on file for this patient.  History reviewed. No pertinent family history.   Current Facility-Administered Medications:  .  albuterol (PROVENTIL) (2.5 MG/3ML) 0.083% nebulizer solution 3 mL, 3 mL, Inhalation, Q6H PRN, Madalyn Rob, MD .  calcium carbonate (TUMS - dosed in mg elemental calcium) chewable tablet 750 mg, 750 mg, Oral, Daily PRN, Madalyn Rob, MD .  cholecalciferol (VITAMIN D3) tablet 1,000 Units, 1,000 Units, Oral, Daily, Madalyn Rob, MD, 1,000 Units at 02/15/20 1122 .  [START ON 02/17/2020] conjugated estrogens (PREMARIN) vaginal cream 1 Applicatorful, 1 Applicatorful, Vaginal,  Once per day on Mon Thu, Steen, Jeneen Rinks, MD .  Derrill Memo ON 02/16/2020] furosemide (LASIX) tablet 20 mg, 20 mg, Oral, Daily, Seawell, Jaimie A, DO .  losartan (COZAAR) tablet 50 mg, 50 mg, Oral, Daily, Velna Ochs, MD, 50 mg at 02/15/20 1122 .  metoprolol succinate (TOPROL-XL) 24 hr tablet 12.5 mg, 12.5 mg, Oral, Daily, Madalyn Rob, MD, 12.5 mg at 02/15/20 1123 .  pantoprazole (PROTONIX) EC tablet 40 mg, 40 mg, Oral, Daily, Madalyn Rob, MD, 40 mg at 02/15/20 1122 .  simvastatin (ZOCOR) tablet 20 mg, 20 mg, Oral, QHS, Madalyn Rob, MD, 20 mg at 02/15/20 0032 .  traMADol (ULTRAM) tablet 50 mg, 50 mg, Oral, Q6H PRN, Madalyn Rob, MD, 50 mg at 02/15/20 0032 .  [START ON 02/16/2020] warfarin (COUMADIN) tablet 2.5 mg, 2.5 mg, Oral, Q M,W,F,Su-1800, Robertson, Crystal S, RPH .  warfarin (COUMADIN) tablet 5 mg, 5 mg, Oral, Q T,Th,Sat-1800, Alford Highland Kopperl, RPH .  Warfarin - Pharmacist Dosing Inpatient, , Does not apply, q1600, Mancheril, Darnell Level, RPH .  zolpidem (AMBIEN) tablet 5 mg, 5 mg, Oral, QHS PRN, Madalyn Rob, MD, 5 mg at 02/15/20 0032   Significant Hospital Events   x  Consults:  x  Procedures:  x  Significant Diagnostic Tests:  x  Micro Data:  x  Antimicrobials:  x   Interim history/subjective:  02/15/2020-seen in bed 5 and 14 at [facility]  Objective   Blood pressure 136/72, pulse 83, temperature 98 F (36.7 C), temperature source Oral, resp. rate 18, height 5\' 1"  (1.549 m), weight (P) 106.3 kg, SpO2 93 %.        Intake/Output Summary (Last 24 hours) at 02/15/2020 1430 Last data filed at 02/15/2020 1300 Gross per 24 hour  Intake 480 ml  Output 1325 ml  Net -845 ml   Filed Weights   02/14/20 1544 02/15/20 0017  Weight: 99.8 kg (P) 106.3 kg    Examination: General: Obese cushingoid female lying in the bed.  No distress HENT: Mallampati class III.  No neck nodes no elevated JVP Lungs: Clear to auscultation bilaterally Cardiovascular: Normal heart  sounds regular rate and rhythm Abdomen: Obese and soft Extremities: No cyanosis.  No clubbing.  No edema Neuro: Alert and oriented x3.  Speech is normal.  Moves all 4 extremities GU: Not examined  Resolved Hospital Problem list   X  Assessment & Plan:   #Baseline Very limited smoking history several decades ago History of right lower lobectomy in 2000 for early stage lung cancer History of rheumatoid arthritis on Arava and chronic prednisone   #Current Right apical subpleural and right suprahilar masses - Extension of this mass into the right hilum.  Satellite lung nodules Right mediastinal soft tissue mass or abnormal lymph node with extension to the right hilum.   Overall concern here is for recurrence of lung cancer with differential diagnosis being rheumatoid arthritis nodule  Plan -Biopsy indicated.Risks of pneumothorax, hemothorax, sedation/anesthesia complications such as  cardiac or respiratory arrest or hypotension, stroke and bleeding all explained. Benefits of diagnosis but limitations of non-diagnosis also explained. Patient verbalized understanding and wished to proceed.  She is fully aware about the 9 diagnosis part having gone through this with her right lower lobe lung nodule 21 years ago  -Hold Coumadin - check INR daily - need </= 1.6 to do case  -Aim for general anesthesia based endobronchial ultrasound with or without navigational bronchoscopy in the week of February 17, 2020 [to be done by Dr. Leory Plowman Icard or Dr. Baltazar Apo)   - wuill need to post case 02/17/20    Best practice:  Diet: will need to be NPO night before procedure Code status - full Family Communication: patient Disposition: floor per primary MD   Ccm will follow in chart over weekend  SIGNATURE    Dr. Brand Males, M.D., F.C.C.P,  Pulmonary and Critical Care Medicine Staff Physician, [place] Director - Interstitial Lung Disease  Program  [facility] [place] [facility] [place], [place], [address]  Pager: [phone], If no answer or between  15:00h - 7:00h: call [phone] Telephone: [phone]  2:30 PM 02/15/2020     LABS    PULMONARY No results for input(s): PHART, PCO2ART, PO2ART, HCO3, TCO2, O2SAT in the last 168 hours.  Invalid input(s): PCO2, PO2  CBC Recent Labs  Lab 02/14/20 1644 02/15/20 0124  HGB 14.3 14.3  HCT 44.1 44.1  WBC 8.2 7.9  PLT 173 160    COAGULATION Recent Labs  Lab 02/14/20 1857 02/15/20 0124  INR 2.5* 2.1*    CARDIAC  No results for input(s): TROPONINI in the last 168 hours. No results for input(s): PROBNP in the last 168 hours.   CHEMISTRY Recent Labs  Lab 02/14/20 1644 02/15/20 0121  NA 138 138  K 3.6 3.5  CL 102 102  CO2 24 23  GLUCOSE 89 99  BUN 12 12  CREATININE 0.81 0.82  CALCIUM 9.2 9.0  MG  --  1.9  PHOS  --  3.0   Estimated Creatinine Clearance: 62.2 mL/min (by C-G formula based on SCr of 0.82 mg/dL).   LIVER Recent Labs  Lab 02/14/20 1644 02/14/20 1857 02/15/20 0124  AST 29  --   --   ALT 25  --   --   ALKPHOS 62  --   --   BILITOT 1.2  --   --   PROT 5.7*  --   --   ALBUMIN 3.2*  --   --   INR  --  2.5* 2.1*     INFECTIOUS No results for input(s): LATICACIDVEN, PROCALCITON in the last 168 hours.   ENDOCRINE CBG (last 3)  No results for input(s): GLUCAP in the last 72 hours.       IMAGING x48h  - image(s) personally visualized  -   highlighted in bold DG Sacrum/Coccyx  Result Date: 02/15/2020 CLINICAL DATA:  Fall with sacrococcygeal pain. EXAM: SACRUM AND COCCYX - 2+ VIEW COMPARISON:  None. FINDINGS: Mild diffuse decreased bone mineralization. No evidence of acute fracture. Mild degenerate change of the sacroiliac joints, spine and hips. Contrast is present within the bladder IMPRESSION: No acute fracture. Electronically Signed   By: Marin Olp M.D.   On: 02/15/2020 12:32   CT CHEST W  CONTRAST  Result Date: 02/14/2020 CLINICAL DATA:  79 year old female with with generalized weakness and not being able to  stand. EXAM: CT CHEST WITH CONTRAST TECHNIQUE: Multidetector CT imaging of the chest was performed during intravenous contrast administration. CONTRAST:  81mL OMNIPAQUE IOHEXOL 300 MG/ML  SOLN COMPARISON:  Chest radiograph dated 10/23/2017. FINDINGS: Evaluation of this exam is limited in the absence of intravenous contrast. Cardiovascular: Borderline cardiomegaly with mild dilatation of the left atrium. There is coronary vascular calcification primarily involving the LAD and RCA. There is advanced atherosclerotic calcification of the thoracic aorta. No aneurysmal dilatation or dissection. The origins of the great vessels of the aortic arch appear patent as visualized. The central pulmonary arteries appear patent. Evaluation of the pulmonary arteries is limited due to suboptimal opacification and timing of the contrast. Mediastinum/Nodes: There is a lobulated mass or enlarged abnormal lymph node in the mediastinum anterior to the right mainstem bronchus measuring 2.5 x 2.3 cm most consistent with metastatic disease. There is extension of this mass into the right hilum. The esophagus is grossly unremarkable. No mediastinal fluid collection. Lungs/Pleura: There is a lobulated right apical subpleural nodule measuring 2.8 x 2.1 cm extending inferiorly to the right suprahilar region consistent with malignancy. There is a 2.0 x 1.6 cm right suprahilar soft tissue nodule. There is a cluster of nodular density in the right upper lobe (54/4) extending to the right lateral pleural surface and along the right minor fissure most consistent with satellite nodules. There is no lobar consolidation, pleural effusion, pneumothorax. Mild background of emphysema. There is mild narrowing of the right upper lobe bronchus centrally secondary to mass effect and compression by the right hilar/mediastinal mass. The  central airways however remain patent. Upper Abdomen: Subcentimeter left renal upper pole hypodense focus. The visualized upper abdomen is otherwise unremarkable. Musculoskeletal: There is osteopenia with degenerative changes of the spine. T9 hemangioma. No acute osseous pathology. IMPRESSION: 1. Right apical subpleural and right suprahilar masses consistent with malignancy. There is extension of this mass into the right hilum. 2. Right mediastinal soft tissue mass or abnormal lymph node with extension to the right hilum. Bronchoscopy may provide better evaluation. 3. A cluster of nodular density in the right upper lobe extending to the right lateral pleural surface and along the right minor fissure most consistent with satellite nodules. 4. Aortic Atherosclerosis (ICD10-I70.0) and Emphysema (ICD10-J43.9). Electronically Signed   By: Anner Crete M.D.   On: 02/14/2020 20:57   MR THORACIC SPINE WO CONTRAST  Result Date: 02/14/2020 CLINICAL DATA:  Back pain with leg weakness and foot numbness. EXAM: MRI THORACIC AND LUMBAR SPINE WITHOUT CONTRAST TECHNIQUE: Multiplanar and multiecho pulse sequences of the thoracic and lumbar spine were obtained without intravenous contrast. COMPARISON:  Limited correlation made with chest radiographs 10/23/2017 and lumbar spine radiographs 01/30/2020. FINDINGS: MRI THORACIC SPINE FINDINGS Alignment: The alignment of the thoracic spine is normal. There are moderately advanced degenerative changes in the cervical spine associated with a mild anterolisthesis at C4-5. Vertebrae: No acute or suspicious osseous findings. Multilevel cervical spondylosis with endplate degenerative changes. No evidence of osseous metastatic disease. Cord:  The thoracic cord appears normal in signal and caliber. Paraspinal and other soft tissues: There is an irregular mass medially in the right upper lobe, measuring 2.9 x 2.4 cm on image 9/22. There are associated enlarged right hilar and paratracheal  lymph nodes, measuring up to 2.1 cm on image 14/22. These findings are worrisome for bronchogenic carcinoma, and further evaluation with chest CT recommended. No focal paraspinal abnormalities. Disc levels: The thoracic spinal canal is widely patent. There are minor thoracic spine degenerative changes,  and no evidence of disc herniation, spinal stenosis or nerve root encroachment. As above, moderate cervical spondylosis is present without gross cord deformity. MRI LUMBAR SPINE FINDINGS Segmentation: There are 5 lumbar type vertebral bodies. Alignment: Minimal convex right scoliosis. Minimal degenerative anterolisthesis at L4-5 and L5-S1. Vertebrae: No worrisome osseous lesion, acute fracture or pars defect. No evidence of osseous metastatic disease. The visualized sacroiliac joints appear unremarkable. Conus medullaris: Extends to the L1 level and appears normal. Paraspinal and other soft tissues: No significant paraspinal findings. Left renal cortical thinning and cysts are noted. Disc levels: L1-2: Mild disc bulging. No spinal stenosis or nerve root encroachment. L2-3: Normal interspace. L3-4: Mild disc bulging, facet and ligamentous hypertrophy. No significant spinal stenosis or nerve root encroachment. L4-5: Mild disc bulging with moderate facet and ligamentous hypertrophy. Mild spinal stenosis with mild narrowing of the lateral recesses and right foramen. No nerve root encroachment. L5-S1: Mild disc bulging and facet hypertrophy. No significant spinal stenosis or nerve root encroachment. IMPRESSION: 1. Irregular mass medially in the right upper lobe with associated right hilar and paratracheal adenopathy, highly worrisome for bronchogenic carcinoma. Further evaluation with chest CT with contrast recommended. 2. No acute findings or clear explanation for the patient's symptoms in the spine. No evidence of osseous metastatic disease. 3. Mild multifactorial spinal stenosis at L4-5 with mild narrowing of the  lateral recesses and right foramen. 4. Mild disc bulging and facet hypertrophy at the other levels as described. No high-grade spinal stenosis or nerve root encroachment. 5. 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 Electronically Signed   By: Richardean Sale M.D.   On: 02/14/2020 18:21   MR LUMBAR SPINE WO CONTRAST  Result Date: 02/14/2020 CLINICAL DATA:  Back pain with leg weakness and foot numbness. EXAM: MRI THORACIC AND LUMBAR SPINE WITHOUT CONTRAST TECHNIQUE: Multiplanar and multiecho pulse sequences of the thoracic and lumbar spine were obtained without intravenous contrast. COMPARISON:  Limited correlation made with chest radiographs 10/23/2017 and lumbar spine radiographs 01/30/2020. FINDINGS: MRI THORACIC SPINE FINDINGS Alignment: The alignment of the thoracic spine is normal. There are moderately advanced degenerative changes in the cervical spine associated with a mild anterolisthesis at C4-5. Vertebrae: No acute or suspicious osseous findings. Multilevel cervical spondylosis with endplate degenerative changes. No evidence of osseous metastatic disease. Cord:  The thoracic cord appears normal in signal and caliber. Paraspinal and other soft tissues: There is an irregular mass medially in the right upper lobe, measuring 2.9 x 2.4 cm on image 9/22. There are associated enlarged right hilar and paratracheal lymph nodes, measuring up to 2.1 cm on image 14/22. These findings are worrisome for bronchogenic carcinoma, and further evaluation with chest CT recommended. No focal paraspinal abnormalities. Disc levels: The thoracic spinal canal is widely patent. There are minor thoracic spine degenerative changes, and no evidence of disc herniation, spinal stenosis or nerve root encroachment. As above, moderate cervical spondylosis is present without gross cord deformity. MRI LUMBAR SPINE FINDINGS Segmentation: There are 5 lumbar type vertebral bodies. Alignment: Minimal convex right  scoliosis. Minimal degenerative anterolisthesis at L4-5 and L5-S1. Vertebrae: No worrisome osseous lesion, acute fracture or pars defect. No evidence of osseous metastatic disease. The visualized sacroiliac joints appear unremarkable. Conus medullaris: Extends to the L1 level and appears normal. Paraspinal and other soft tissues: No significant paraspinal findings. Left renal cortical thinning and cysts are noted. Disc levels: L1-2: Mild disc bulging. No spinal stenosis or nerve root encroachment. L2-3: Normal interspace. L3-4: Mild disc bulging, facet  and ligamentous hypertrophy. No significant spinal stenosis or nerve root encroachment. L4-5: Mild disc bulging with moderate facet and ligamentous hypertrophy. Mild spinal stenosis with mild narrowing of the lateral recesses and right foramen. No nerve root encroachment. L5-S1: Mild disc bulging and facet hypertrophy. No significant spinal stenosis or nerve root encroachment. IMPRESSION: 1. Irregular mass medially in the right upper lobe with associated right hilar and paratracheal adenopathy, highly worrisome for bronchogenic carcinoma. Further evaluation with chest CT with contrast recommended. 2. No acute findings or clear explanation for the patient's symptoms in the spine. No evidence of osseous metastatic disease. 3. Mild multifactorial spinal stenosis at L4-5 with mild narrowing of the lateral recesses and right foramen. 4. Mild disc bulging and facet hypertrophy at the other levels as described. No high-grade spinal stenosis or nerve root encroachment. 5. 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 Electronically Signed   By: Richardean Sale M.D.   On: 02/14/2020 18:21

## 2020-02-15 NOTE — Progress Notes (Signed)
HD#1 Subjective:  Overnight Events: Admitted overnight   She has been slipping and having lower extremity weakness. No UE weakness. She knows that there is a mass in the lung as they talked to her about it last night. Discussed that we will need someone to biopsy it.   She is not sure if pain or weakness is causing her falls. She states her feet are currently numb which has been going on for one week. She has been falling, the first in her bathroom several weeks ago.   What brought her into the hospital she had an episode of dizziness in the bathroom and fell against her shower door. She had not had any dizzy spells prior to this.   Objective:  Vital signs in last 24 hours: Vitals:   02/14/20 2051 02/15/20 0017 02/15/20 0456 02/15/20 0926  BP: (!) 160/73 (!) 142/103 129/80 136/72  Pulse: 77 87 92 83  Resp: 19 20 20 18   Temp:  97.7 F (36.5 C) 98.1 F (36.7 C) 98 F (36.7 C)  TempSrc:  Oral Oral Oral  SpO2: 95% 96% 95% 93%  Weight:  (P) 106.3 kg    Height:       Supplemental O2: Room Air SpO2: 93 %   Physical Exam:  Physical Exam Constitutional:      Appearance: Normal appearance.  HENT:     Head: Normocephalic and atraumatic.  Eyes:     General: No scleral icterus.    Extraocular Movements: Extraocular movements intact.     Pupils: Pupils are equal, round, and reactive to light.  Cardiovascular:     Rate and Rhythm: Normal rate.     Pulses: Normal pulses.     Heart sounds: No murmur heard.   Pulmonary:     Effort: Pulmonary effort is normal. No respiratory distress.     Breath sounds: Normal breath sounds.  Abdominal:     General: Abdomen is flat. There is no distension.     Tenderness: There is no abdominal tenderness.  Musculoskeletal:        General: Normal range of motion.     Cervical back: Normal range of motion. No tenderness.  Skin:    General: Skin is warm and dry.  Neurological:     Mental Status: She is alert and oriented to person, place, and  time.     Motor: Weakness (right lower extremity 3-4/5) present.     Comments: Sensation and proprioception is intact.      Filed Weights   02/14/20 1544 02/15/20 0017  Weight: 99.8 kg (P) 106.3 kg     Intake/Output Summary (Last 24 hours) at 02/15/2020 1348 Last data filed at 02/15/2020 1300 Gross per 24 hour  Intake 480 ml  Output 1325 ml  Net -845 ml   Net IO Since Admission: -845 mL [02/15/20 1348]  Pertinent Labs: CBC Latest Ref Rng & Units 02/15/2020 02/14/2020 02/07/2020  WBC 4.0 - 10.5 K/uL 7.9 8.2 7.5  Hemoglobin 12.0 - 15.0 g/dL 14.3 14.3 14.7  Hematocrit 36 - 46 % 44.1 44.1 45.7  Platelets 150 - 400 K/uL 160 173 199    CMP Latest Ref Rng & Units 02/15/2020 02/14/2020 02/07/2020  Glucose 70 - 99 mg/dL 99 89 92  BUN 8 - 23 mg/dL 12 12 18   Creatinine 0.44 - 1.00 mg/dL 0.82 0.81 0.87  Sodium 135 - 145 mmol/L 138 138 137  Potassium 3.5 - 5.1 mmol/L 3.5 3.6 4.2  Chloride 98 - 111 mmol/L  102 102 103  CO2 22 - 32 mmol/L 23 24 23   Calcium 8.9 - 10.3 mg/dL 9.0 9.2 9.0  Total Protein 6.5 - 8.1 g/dL - 5.7(L) -  Total Bilirubin 0.3 - 1.2 mg/dL - 1.2 -  Alkaline Phos 38 - 126 U/L - 62 -  AST 15 - 41 U/L - 29 -  ALT 0 - 44 U/L - 25 -    Pending Labs: MRI of brain  Imaging:   Assessment/Plan:   Principal Problem:   Frequent falls Active Problems:   Lung mass   Paresthesias   Patient Summary: Leah Velasquez is a 79 y.o. with a pertinent PMH of chronic afib on Coumadin, HTN, HFpEF, RA, remote lung cancer s/p resection who presents for progressive lower extremity weakness with recurrent falls and numbness in her feet, and admitted for further evaluation. She is on hospital day 1 and doing well.   Right lungs mass with high suspicion for malignancy  Patient has remote history of lung cancer with an apparent resection 20 years ago.  I do not see any records in her chart or in care everywhere.  Patient got an MRI of her thoracic and lumbar spine due to concerns for lower  extremity weakness and paresthesias which showed a lung mass.  The mass was evaluated with a CT scan of the chest showing right apical subpleural and right suprahilar masses consistent with malignancy. There is extension of this mass into the right Hilum. Patient dose have a significant history of smoking. She denies respiratory symptoms at this time nor has she been experiencing nights sweats or unexplained weight loss. On evaluation she appears to be having mild right LE weakness with paresthesias in her lower extremities bilaterally. I have ordered an MIR of the brain to r/o metastatic disease as the cause of her weakness and subsequent falls. Patient's neurologic symptoms could be a result of paraneoplastic syndrome.   - Consult Pulm for recommendations regarding bronchoscopy and biopsy of the mass. I appreciate their recommendations. - MRI of brain for metastasis  - Consult oncology if mass is cancerous.   Progressive LE weakness On re-evaluation, she appears to have unilateral right sided weakness with intact sensation and proprioception. Differential includes metastatic disease to brain, gout flare, OA, vs paraneoplastic syndrome.   - MRI brain pending - PT/OT consulted  - Will wait to order serologies for paraneoplastic syndrome once more likely etiologies have been r/o.   Sensory neuropathy Patient admits to lower extremity paresthesias that have developed over the last several weeks to months.  She is concerned that this may be contributing to her falls.  Patient is taking a leflunomide 20 mg for RA. She states that she started taking this medication in the last 2-3 months. This medication does have peripheral neuropathy as a potential adverse effect. I will discontinue it at this time and instruct her to follow-up with her rheumatologist for further evaluation and management of her rheumatoid arthritis.  - D/c leflunomide  - A1c of 5.7 - TSH WNL - B12 pending - RPR pending - MRI  brian pending. - Denies symptoms concerning for heavy metal exposure.   Diet: Heart Healthy IVF: None,None VTE: Warfarin Code: Full PT/OT recs: Pending. TOC recs: pending   Dispo: Anticipated discharge to Home in 2 days.    Marianna Payment, D.O. [facility], PGY-1 Date 02/15/2020 Time 1:48 PM Pager: [phone] Please contact the on call pager after 5 pm and on weekends at [phone].

## 2020-02-16 LAB — COMPREHENSIVE METABOLIC PANEL
ALT: 25 U/L (ref 0–44)
AST: 21 U/L (ref 15–41)
Albumin: 2.8 g/dL — ABNORMAL LOW (ref 3.5–5.0)
Alkaline Phosphatase: 66 U/L (ref 38–126)
Anion gap: 11 (ref 5–15)
BUN: 13 mg/dL (ref 8–23)
CO2: 27 mmol/L (ref 22–32)
Calcium: 8.7 mg/dL — ABNORMAL LOW (ref 8.9–10.3)
Chloride: 100 mmol/L (ref 98–111)
Creatinine, Ser: 0.91 mg/dL (ref 0.44–1.00)
GFR calc Af Amer: >60 mL/min (ref >60)
GFR calc non Af Amer: >60 mL/min (ref >60)
Glucose, Bld: 111 mg/dL — ABNORMAL HIGH (ref 70–99)
Potassium: 3.1 mmol/L — ABNORMAL LOW (ref 3.5–5.1)
Sodium: 138 mmol/L (ref 135–145)
Total Bilirubin: 1.2 mg/dL (ref 0.3–1.2)
Total Protein: 5.6 g/dL — ABNORMAL LOW (ref 6.5–8.1)

## 2020-02-16 LAB — CBC
HCT: 42.2 % (ref 36.0–46.0)
Hemoglobin: 13.9 g/dL (ref 12.0–15.0)
MCH: 31.7 pg (ref 26.0–34.0)
MCHC: 32.9 g/dL (ref 30.0–36.0)
MCV: 96.1 fL (ref 80.0–100.0)
Platelets: 158 10*3/uL (ref 150–400)
RBC: 4.39 MIL/uL (ref 3.87–5.11)
RDW: 14.6 % (ref 11.5–15.5)
WBC: 7.1 10*3/uL (ref 4.0–10.5)
nRBC: 0 % (ref 0.0–0.2)

## 2020-02-16 LAB — RPR: RPR Ser Ql: NONREACTIVE

## 2020-02-16 LAB — PROTIME-INR
INR: 1.9 — ABNORMAL HIGH (ref 0.8–1.2)
Prothrombin Time: 20.8 seconds — ABNORMAL HIGH (ref 11.4–15.2)

## 2020-02-16 MED ORDER — POTASSIUM CHLORIDE CRYS ER 20 MEQ PO TBCR
40.0000 meq | EXTENDED_RELEASE_TABLET | ORAL | Status: AC
  Administered 2020-02-16 (×2): 40 meq via ORAL
  Filled 2020-02-16 (×2): qty 2

## 2020-02-16 MED ORDER — CYANOCOBALAMIN 1000 MCG/ML IJ SOLN
1000.0000 ug | Freq: Once | INTRAMUSCULAR | Status: AC
  Administered 2020-02-16: 1000 ug via INTRAMUSCULAR
  Filled 2020-02-16: qty 1

## 2020-02-16 MED ORDER — ACETAMINOPHEN 325 MG PO TABS: 650.0000 mg | ORAL_TABLET | Freq: Four times a day (QID) | ORAL | Status: DC | PRN

## 2020-02-16 MED ORDER — VITAMIN B-12 1000 MCG PO TABS
1000.0000 ug | ORAL_TABLET | Freq: Every day | ORAL | Status: DC
  Administered 2020-02-17: 1000 ug via ORAL
  Filled 2020-02-16: qty 1

## 2020-02-16 MED ORDER — TIMOLOL MALEATE 0.5 % OP SOLN
1.0000 [drp] | Freq: Two times a day (BID) | OPHTHALMIC | Status: DC
  Administered 2020-02-16 – 2020-02-20 (×9): 1 [drp] via OPHTHALMIC
  Filled 2020-02-16: qty 5

## 2020-02-16 NOTE — Progress Notes (Addendum)
   Subjective:   She is doing ok this morning. She continues to have some pain on her backside after her fall. Discussed xr findings. Discussed her low B12 and stopping leflunomide. She continues to endorse tingling in her feet and states that she sometimes has burning.  She denies SOB, cough.   Also spoke with her daughter Jackelyn Poling, who lives out of town and is planning on [place] in with her sister. 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 Jackelyn Poling states that her mom has seemed more "off" in the past month. She is repeating herself more, has been more forgetful. She thinks her mom is also depressed as she seems to want to sleep all the time.   Objective:  Vital signs in last 24 hours: Vitals:   02/15/20 0926 02/15/20 1706 02/15/20 2000 02/15/20 2002  BP: 136/72 138/71 127/75 127/75  Pulse: 83 88 82 69  Resp: 18 18 20 20   Temp: 98 F (36.7 C) 98.2 F (36.8 C) 98.1 F (36.7 C) 98.1 F (36.7 C)  TempSrc: Oral Oral Oral Oral  SpO2: 93% 93% 96% 96%  Weight:      Height:       Constitution: NAD, appears stated age Cardio: RRR, no m/r/g, no LE edema, +pedal pulses b/l Respiratory: CTA, no w/r/r Abdominal: NTTP, soft, non-distended MSK: moving all extremities, strength 5/5 and symmetric except dorsiflexion strenght 2/5 bilaterally; sensation decreased toes of LLE. Temperature and pain sensation intact.  Neuro: normal affect, a&ox3, pleasant Skin: c/d/i   Assessment/Plan:  Principal Problem:   Frequent falls Active Problems:   Lung mass   Paresthesias  Leah Velasquez is a 78yo with a pertinent PMH of chronic afib on Coumadin, HTN, HFpEF, RA, remote lung cancer s/p resection who presents for progressive lower extremity weakness with recurrent falls and numbness in her feet, and admitted for further evaluation.  Right Apical and Right Suprahilar Lung Mass  Satellite Lung Nodules History of lung cancer with resection, no notes in chart as this was over  20 years ago. She also has RA and this could be RA nodule per pulmonology. Her coumadin was held yesterday and plan is for EBUS tomorrow, June 28th. INR today is 1.9 from 2.1 yesterday.   - NPO@ midnight for possible EBUS tomorrow - f/u pulmonology, appreciate recommendations and input   Progressive Lower Extremity Weakness  Sensory Neuropathy MRI done and does not show any signs of metastasis. Symptoms possibly secondary to leflunomide, which was started recently after she was weaned from prednisone for RA. She also has low B12. Differential also includes weakness related gout, her OA, and paraneoplastic disease.   - B12 shot today, switch to oral tomorrow 1,000 daily.  - f/u MMA  - discontinued leflunomide yest, will need to discuss with rheumotology alternative options. She does not want to be on prednisone again - PT/OT - consult TOC for SNF - consider serologies for paraneoplastic syndrome pending EBUS findings.  - f/u RPR - SLP consulted for cognitive eval   HTN Continue losartan 50 mg qd  HFpEF - continue lasix 40 mg qd. Replete K as needed.  - cont. toprol 12.5 mg daily   VTE: warfarin - held, SCDs IVF: none Diet: HH Code: full  Dispo: Anticipated discharge in pending medical work-up.   Duyen Beckom A, DO 02/16/2020, 6:49 AM Pager: [phone] After 5pm on weekdays and 1pm on weekends: On Call Pager: [phone]

## 2020-02-16 NOTE — Plan of Care (Signed)
  Problem: Clinical Measurements: Goal: Cardiovascular complication will be avoided Outcome: Adequate for Discharge

## 2020-02-16 NOTE — Progress Notes (Signed)
   D/w dR [employer] specialist for nodules - > he is on service at [facility] in 57M week of 02/17/20. Schedule will have to be decided afer he sees patient on 02/17/20 at some point and INR has to be < 1.6  Plan  - no need for npo tonight  - hold coumadin  - track INR - Dr Lamonte Sakai will round 02/17/20   SIGNATURE    Dr. Brand Males, M.D., F.C.C.P,  Pulmonary and Critical Care Medicine Staff Physician, [place] Director - Interstitial Lung Disease  Program  Pulmonary [place] [facility] [place], [place], [address]  Pager: [phone], If no answer or between  15:00h - 7:00h: call [phone] Telephone: [phone]  1:17 PM 02/16/2020

## 2020-02-16 NOTE — Evaluation (Signed)
Physical Therapy Evaluation Patient Details Name: Leah Velasquez MRN: [id number] DOB: [birth year] Today's Date: 02/16/2020   History of Present Illness  Patient is a 79 y/o female presenting to the ED on 02/14/20 due to LE weakness, numbness and falls. PMH of chronic afib on Coumadin, HTN, HFpEF, remote history of lung cancer s/p resection. CT chest showed right apical and suprahilar masses consistent with malignancy with extension into the hilum. Patient admitted for further work up.     Clinical Impression  Patient admitted with the above. Reports she lives at home with her husband and uses RW for mobility at baseline. Patient today requiring Mod A +2 for bed level mobility and transfers. Unable to progress gait away from bed due to safety precautions and patient only able to minimally shift weight for side stepping at bedside. Patient encouraged to perform pivot transfers at bedside to [facility] with patient declining further mobility. PT to recommend SNF at discharge. Will follow acutely.     Follow Up Recommendations SNF    Equipment Recommendations  None recommended by PT    Recommendations for Other Services       Precautions / Restrictions Precautions Precautions: Fall Restrictions Weight Bearing Restrictions: No      Mobility  Bed Mobility Overal bed mobility: Needs Assistance Bed Mobility: Supine to Sit;Sit to Supine     Supine to sit: Mod assist;+2 for physical assistance Sit to supine: Mod assist   General bed mobility comments: Mod A for LE and trunk management; use of bed pad to rotate hips; cueing for sequencing  Transfers Overall transfer level: Needs assistance Equipment used: Rolling walker (2 wheeled) Transfers: Sit to/from Stand Sit to Stand: Mod assist         General transfer comment: Mod A with elevated bed height to come into standing; unsteady static balance  Ambulation/Gait             General Gait Details: only taking a few very small side  steps at bedside - requires heavy encouragement for this  Stairs            Wheelchair Mobility    Modified Rankin (Stroke Patients Only)       Balance Overall balance assessment: Needs assistance Sitting-balance support: Feet supported Sitting balance-Leahy Scale: Fair     Standing balance support: Bilateral upper extremity supported Standing balance-Leahy Scale: Poor Standing balance comment: reliant on external support                             Pertinent Vitals/Pain Pain Assessment: 0-10 Pain Score: 8  Pain Location: "tailbone" Pain Descriptors / Indicators: Aching;Discomfort;Guarding    Home Living Family/patient expects to be discharged to:: Private residence Living Arrangements: Spouse/significant other Available Help at Discharge: Family;Available 24 hours/day Type of Home: House Home Access: Stairs to enter     Home Layout: One level Home Equipment: Environmental consultant - 2 wheels;Grab bars - tub/shower      Prior Function Level of Independence: Needs assistance   Gait / Transfers Assistance Needed: use of RW  ADL's / Homemaking Assistance Needed: takes sponge baths, husband assist with pants        Hand Dominance        Extremity/Trunk Assessment   Upper Extremity Assessment Upper Extremity Assessment: Defer to OT evaluation    Lower Extremity Assessment Lower Extremity Assessment: Generalized weakness    Cervical / Trunk Assessment Cervical / Trunk Assessment: Kyphotic  Communication  Communication: No difficulties  Cognition Arousal/Alertness: Awake/alert Behavior During Therapy: WFL for tasks assessed/performed Overall Cognitive Status: Within Functional Limits for tasks assessed                                        General Comments      Exercises     Assessment/Plan    PT Assessment Patient needs continued PT services  PT Problem List Decreased strength;Decreased activity tolerance;Decreased  balance;Decreased mobility;Decreased knowledge of use of DME;Decreased safety awareness       PT Treatment Interventions DME instruction;Gait training;Functional mobility training;Therapeutic activities;Therapeutic exercise;Balance training;Patient/family education    PT Goals (Current goals can be found in the Care Plan section)  Acute Rehab PT Goals Patient Stated Goal: none stated PT Goal Formulation: With patient Time For Goal Achievement: 03/01/20 Potential to Achieve Goals: Good    Frequency Min 2X/week   Barriers to discharge        Co-evaluation               AM-PAC PT "6 Clicks" Mobility  Outcome Measure Help needed turning from your back to your side while in a flat bed without using bedrails?: A Lot Help needed moving from lying on your back to sitting on the side of a flat bed without using bedrails?: A Lot Help needed moving to and from a bed to a chair (including a wheelchair)?: Total Help needed standing up from a chair using your arms (e.g., wheelchair or bedside chair)?: A Lot Help needed to walk in hospital room?: Total Help needed climbing 3-5 steps with a railing? : Total 6 Click Score: 9    End of Session Equipment Utilized During Treatment: Gait belt Activity Tolerance: Patient tolerated treatment well Patient left: in bed;with call bell/phone within reach;with nursing/sitter in room Nurse Communication: Mobility status PT Visit Diagnosis: Unsteadiness on feet (R26.81);Other abnormalities of gait and mobility (R26.89);Muscle weakness (generalized) (M62.81);History of falling (Z91.81)    Time: 7282-0601 PT Time Calculation (min) (ACUTE ONLY): 22 min   Charges:   PT Evaluation $PT Eval High Complexity: 1 High         Milana Na, PT, DPT Supplemental Physical Therapist 02/16/20 1:25 PM Office: [phone]

## 2020-02-17 LAB — BASIC METABOLIC PANEL
Anion gap: 9 (ref 5–15)
BUN: 15 mg/dL (ref 8–23)
CO2: 27 mmol/L (ref 22–32)
Calcium: 8.9 mg/dL (ref 8.9–10.3)
Chloride: 102 mmol/L (ref 98–111)
Creatinine, Ser: 0.86 mg/dL (ref 0.44–1.00)
GFR calc Af Amer: >60 mL/min (ref >60)
GFR calc non Af Amer: >60 mL/min (ref >60)
Glucose, Bld: 104 mg/dL — ABNORMAL HIGH (ref 70–99)
Potassium: 3.2 mmol/L — ABNORMAL LOW (ref 3.5–5.1)
Sodium: 138 mmol/L (ref 135–145)

## 2020-02-17 LAB — CBC
HCT: 42.2 % (ref 36.0–46.0)
Hemoglobin: 13.8 g/dL (ref 12.0–15.0)
MCH: 31.4 pg (ref 26.0–34.0)
MCHC: 32.7 g/dL (ref 30.0–36.0)
MCV: 95.9 fL (ref 80.0–100.0)
Platelets: 144 10*3/uL — ABNORMAL LOW (ref 150–400)
RBC: 4.4 MIL/uL (ref 3.87–5.11)
RDW: 14.3 % (ref 11.5–15.5)
WBC: 6.3 10*3/uL (ref 4.0–10.5)
nRBC: 0 % (ref 0.0–0.2)

## 2020-02-17 LAB — PROTIME-INR
INR: 1.5 — ABNORMAL HIGH (ref 0.8–1.2)
Prothrombin Time: 17.4 seconds — ABNORMAL HIGH (ref 11.4–15.2)

## 2020-02-17 MED ORDER — LOSARTAN POTASSIUM 50 MG PO TABS: 50.0000 mg | ORAL_TABLET | Freq: Every day | ORAL | Status: DC

## 2020-02-17 MED ORDER — PANTOPRAZOLE SODIUM 40 MG PO TBEC
40.0000 mg | DELAYED_RELEASE_TABLET | Freq: Every day | ORAL | Status: DC
  Administered 2020-02-19 – 2020-02-20 (×2): 40 mg via ORAL
  Filled 2020-02-17 (×2): qty 1

## 2020-02-17 MED ORDER — VITAMIN D 25 MCG (1000 UNIT) PO TABS
1000.0000 [IU] | ORAL_TABLET | Freq: Every day | ORAL | Status: DC
  Administered 2020-02-19 – 2020-02-20 (×2): 1000 [IU] via ORAL
  Filled 2020-02-17 (×2): qty 1

## 2020-02-17 MED ORDER — FUROSEMIDE 20 MG PO TABS
20.0000 mg | ORAL_TABLET | Freq: Every day | ORAL | Status: DC
  Administered 2020-02-19 – 2020-02-20 (×2): 20 mg via ORAL
  Filled 2020-02-17 (×2): qty 1

## 2020-02-17 MED ORDER — VITAMIN B-12 1000 MCG PO TABS
1000.0000 ug | ORAL_TABLET | Freq: Every day | ORAL | Status: DC
  Administered 2020-02-19 – 2020-02-20 (×2): 1000 ug via ORAL
  Filled 2020-02-17 (×2): qty 1

## 2020-02-17 NOTE — Progress Notes (Signed)
NAME:  Leah Velasquez, MRN:  [id number], DOB:  1941-03-24, LOS: 3 ADMISSION DATE:  02/14/2020, CONSULTATION DATE:  02/15/20 REFERRING MD:  Gilles Chiquito, CHIEF COMPLAINT:  RUL lung mass   Brief History   RUL lung mass. Hx of RA on arava, prednisone and A Fib on Coumadin. 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  She is status post bilateral knee replacement with the right one in 2011 and the left one in 2014.  For the last 4 to 5 years she has been on chronic prednisonebecause of bew dx of  rheumatoid arthritis [follows up with Dr. Levada Dy Hawkes] with a new diagnosis of rheumatoid arthritis around this time.  In 2019 apparently was admitted for pneumonia and influenza and atrial fibrillation and was hospitalized for over a week.  After that has been using a walker and also has been on warfarin.  She also has been on on methotrexate for her rheumatoid arthritis but for the last 1-2 years has been on [place].  In the last few weeks she has had increased bilateral ankle pain which is caused some unsteadiness in her feet.  She was given Solu-Medrol.  Then just prior to admission she thinks he slipped and fell in her carpet.  Did not lose any consciousness.  Because of this she was admitted.  MRI of the thoracic and lumbar spine has been noncontributory but showed a right upper lobe mass.  This was followed up with a CT scan of the chest  1. Right apical subpleural and right suprahilar masses consistent with malignancy. There is extension of this mass into the right hilum. 2. Right mediastinal soft tissue mass or abnormal lymph node with extension to the right hilum. Bronchoscopy may provide better evaluation. 3. A cluster of nodular density in the right upper lobe  extending to the right lateral pleural surface and along the right minor fissure most consistent with satellite nodules. 4. Aortic Atherosclerosis (ICD10-I70.0) and Emphysema (I  On coumadni at home Not on aspirinl or plavix  Interim history/subjective:   Reviewed CT findings with the patient and daughters at bedside 6/28 No new issues reported Coumadin remains on hold  Objective   Blood pressure 140/66, pulse 78, temperature 97.9 F (36.6 C), temperature source Oral, resp. rate 16, height 5\' 1"  (1.549 m), weight 102.1 kg, SpO2 96 %.        Intake/Output Summary (Last 24 hours) at 02/17/2020 1433 Last data filed at 02/17/2020 0900 Gross per 24 hour  Intake 420 ml  Output 950 ml  Net -530 ml   Filed Weights   02/14/20 1544 02/15/20 0017 02/16/20 2034  Weight: 99.8 kg 106.3 kg 102.1 kg    Examination: General: Obese woman, in bed, comfortable, cushingoid facies HENT: M3 airway, no lymphadenopathy, oropharynx clear, strong voice Lungs: Decreased at both bases, clear bilaterally Cardiovascular: Irregularly irregular, no murmur Abdomen: Nondistended, positive bowel sounds Extremities: No edema Neuro: Awake, alert, interacting appropriately, moves all extremities, follows commands  Resolved Hospital Problem list   X  Assessment & Plan:   Right upper lobe pulmonary nodules with enlarged right hilar lymph node.  Clinical picture concerning for malignancy.  Consider also contribution of rheumatoid nodular disease although the dominant nodules in the  right upper lobe do not look like typical rheumatoid.  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  She has a history of lung cancer and right lower lobectomy in the past. I discussed the CT findings with the patient and her daughters at bedside.  We discussed the pros and cons of proceeding with bronchoscopy now versus waiting and watching with a repeat  CT chest in 3 months.  Risks and benefits of bronchoscopy discussed.  She elects to proceed with bronchoscopy under general anesthesia.  Her Coumadin is on hold.  We will work on scheduling for 6/29     Best practice:  Diet: will need to be NPO night before procedure Code status - full Family Communication: spoke with the patient and daughters at bedside Disposition: floor per primary MD    SIGNATURE   Baltazar Apo, MD, PhD 02/17/2020, 2:34 PM [facility] [phone] or if no answer [phone]    LABS    PULMONARY No results for input(s): PHART, PCO2ART, PO2ART, HCO3, TCO2, O2SAT in the last 168 hours.  Invalid input(s): PCO2, PO2  CBC Recent Labs  Lab 02/15/20 0124 02/16/20 0431 02/17/20 0455  HGB 14.3 13.9 13.8  HCT 44.1 42.2 42.2  WBC 7.9 7.1 6.3  PLT 160 158 144*    COAGULATION Recent Labs  Lab 02/14/20 1857 02/15/20 0124 02/16/20 0431 02/17/20 0455  INR 2.5* 2.1* 1.9* 1.5*    CARDIAC  No results for input(s): TROPONINI in the last 168 hours. No results for input(s): PROBNP in the last 168 hours.   CHEMISTRY Recent Labs  Lab 02/14/20 1644 02/14/20 1644 02/15/20 0121 02/15/20 0121 02/16/20 0431 02/17/20 0455  NA 138  --  138  --  138 138  K 3.6   < > 3.5   < > 3.1* 3.2*  CL 102  --  102  --  100 102  CO2 24  --  23  --  27 27  GLUCOSE 89  --  99  --  111* 104*  BUN 12  --  12  --  13 15  CREATININE 0.81  --  0.82  --  0.91 0.86  CALCIUM 9.2  --  9.0  --  8.7* 8.9  MG  --   --  1.9  --   --   --   PHOS  --   --  3.0  --   --   --    < > = values in this interval not displayed.   Estimated Creatinine Clearance: 60.1 mL/min (by C-G formula based on SCr of 0.86 mg/dL).   LIVER Recent Labs  Lab 02/14/20 1644 02/14/20 1857 02/15/20 0124 02/16/20 0431 02/17/20 0455  AST 29  --   --  21  --   ALT 25  --   --  25  --   ALKPHOS 62  --   --  66  --   BILITOT 1.2  --   --  1.2  --   PROT 5.7*  --   --  5.6*   --   ALBUMIN 3.2*  --   --  2.8*  --   INR  --  2.5* 2.1* 1.9* 1.5*

## 2020-02-17 NOTE — Progress Notes (Signed)
Occupational Therapy Evaluation Patient Details Name: Leah Velasquez MRN: [id number] DOB: [birth year] Today's Date: 02/17/2020    History of Present Illness Patient is a 79 y/o female presenting to the ED on 02/14/20 due to LE weakness, numbness and falls. PMH of chronic afib on Coumadin, HTN, HFpEF, remote history of lung cancer s/p resection. CT chest showed right apical and suprahilar masses consistent with malignancy with extension into the hilum. Patient admitted for further work up.    Clinical Impression   Patient lives at home with spouse and has recently been requiring increased assistance with ADLs and mobility.  R knee has been causing pain and difficulty.  She uses a RW for mobility and has been taking sponges bathes.  Today patient requiring mod assist with bed mobility and mod assist to stand with RW.  Able to take ~10 steps.  Seated at EOB patient needing min guard-min assist to maintain balance and perform UB ADLs.  Max assist with LB ADLs.  Patient would benefit from further therapy to increase balance, strength, and activity tolerance for improved safety and independence with ADLs.     Follow Up Recommendations  SNF;Supervision/Assistance - 24 hour    Equipment Recommendations  Other (comment) (defer to next venue)    Recommendations for Other Services       Precautions / Restrictions Precautions Precautions: Fall Restrictions Weight Bearing Restrictions: No      Mobility Bed Mobility Overal bed mobility: Needs Assistance Bed Mobility: Supine to Sit;Sit to Supine     Supine to sit: Mod assist Sit to supine: Mod assist      Transfers Overall transfer level: Needs assistance Equipment used: Rolling walker (2 wheeled) Transfers: Sit to/from Stand Sit to Stand: Mod assist         General transfer comment: Elevated bed    Balance Overall balance assessment: Needs assistance Sitting-balance support: Feet supported Sitting balance-Leahy Scale: Fair Sitting  balance - Comments: Required min guard-min assist with maintaining seated balance at EOB   Standing balance support: Bilateral upper extremity supported Standing balance-Leahy Scale: Poor                             ADL either performed or assessed with clinical judgement   ADL Overall ADL's : Needs assistance/impaired Eating/Feeding: Independent;Sitting   Grooming: Minimal assistance;Sitting   Upper Body Bathing: Minimal assistance;Sitting   Lower Body Bathing: Maximal assistance;Sit to/from stand   Upper Body Dressing : Minimal assistance;Sitting   Lower Body Dressing: Maximal assistance;Sit to/from stand   Toilet Transfer: Moderate assistance;RW           Functional mobility during ADLs: Moderate assistance;Rolling walker       Vision         Perception     Praxis      Pertinent Vitals/Pain Pain Assessment: 0-10 Pain Score: 4  Pain Location: tailbone and ankles Pain Descriptors / Indicators: Discomfort Pain Intervention(s): Limited activity within patient's tolerance;Premedicated before session;Repositioned     Hand Dominance Right   Extremity/Trunk Assessment Upper Extremity Assessment Upper Extremity Assessment: Generalized weakness   Lower Extremity Assessment Lower Extremity Assessment: Defer to PT evaluation       Communication     Cognition Arousal/Alertness: Awake/alert Behavior During Therapy: WFL for tasks assessed/performed Overall Cognitive Status: Within Functional Limits for tasks assessed  General Comments       Exercises     Shoulder Instructions      Home Living Family/patient expects to be discharged to:: Private residence Living Arrangements: Spouse/significant other Available Help at Discharge: Family;Available 24 hours/day Type of Home: House Home Access: Level entry     Home Layout: One level     Bathroom Shower/Tub: Radiographer, therapeutic: Handicapped height     Home Equipment: Environmental consultant - 2 wheels;Grab bars - tub/shower;Shower seat      Lives With: Spouse    Prior Functioning/Environment    Gait / Transfers Assistance Needed: Lately been using a walker ADL's / Homemaking Assistance Needed: takes sponge baths, husband assist with pants   Comments: R knee hurting more lately and causing more difficulty        OT Problem List: Decreased strength;Decreased range of motion;Decreased activity tolerance;Impaired balance (sitting and/or standing);Obesity;Pain      OT Treatment/Interventions: Self-care/ADL training;Therapeutic exercise;Energy conservation;DME and/or AE instruction;Therapeutic activities;Patient/family education;Balance training    OT Goals(Current goals can be found in the care plan section) Acute Rehab OT Goals Patient Stated Goal: Get R knee feeling better OT Goal Formulation: With patient Time For Goal Achievement: 03/02/20 Potential to Achieve Goals: Good  OT Frequency: Min 2X/week   Barriers to D/C:            Co-evaluation              AM-PAC OT "6 Clicks" Daily Activity     Outcome Measure Help from another person eating meals?: None Help from another person taking care of personal grooming?: A Little Help from another person toileting, which includes using toliet, bedpan, or urinal?: A Lot Help from another person bathing (including washing, rinsing, drying)?: A Lot Help from another person to put on and taking off regular upper body clothing?: A Little Help from another person to put on and taking off regular lower body clothing?: A Lot 6 Click Score: 16   End of Session Equipment Utilized During Treatment: Gait belt;Rolling walker Nurse Communication: Mobility status  Activity Tolerance: Patient tolerated treatment well Patient left: in bed;with call bell/phone within reach  OT Visit Diagnosis: Unsteadiness on feet (R26.81);History of falling (Z91.81);Repeated falls  (R29.6);Pain Pain - Right/Left: Right Pain - part of body: Knee                Time: 1005-1037 OT Time Calculation (min): 32 min Charges:  OT General Charges $OT Visit: 1 Visit OT Evaluation $OT Eval Moderate Complexity: 1 Mod OT Treatments $Self Care/Home Management : 8-22 mins  August Luz, OTR/L   Phylliss Bob 02/17/2020, 1:19 PM

## 2020-02-17 NOTE — Evaluation (Signed)
Speech Language Pathology Evaluation Patient Details Name: Leah Velasquez MRN: [id number] DOB: [birth year] Today's Date: 02/17/2020 Time: 7829-5621 SLP Time Calculation (min) (ACUTE ONLY): 23 min  Problem List:  Patient Active Problem List   Diagnosis Date Noted  . Paresthesias 02/15/2020  . Frequent falls   . Lung mass 02/14/2020  . Long term (current) use of anticoagulants 06/04/2018  . CAP (community acquired pneumonia) 10/22/2017  . Acute respiratory failure with hypoxia ([place]) 10/22/2017  . Atrial fibrillation ([place]) 10/22/2017  . Generalized weakness 10/22/2017  . Essential hypertension 10/22/2017  . Thrombocytopenia ([place]) 10/22/2017  . Hyperlipidemia 10/22/2017   Past Medical History:  Past Medical History:  Diagnosis Date  . A-fib ([place])   . Hemorrhoids   . Hyperlipidemia   . Hypertension   . Lung cancer ([place]) 2000   RLL  . Obesity   . Persistent atrial fibrillation ([place])   . Rheumatoid arthritis [facility])    Past Surgical History:  Past Surgical History:  Procedure Laterality Date  . BREAST EXCISIONAL BIOPSY Left    x 2  . BREAST EXCISIONAL BIOPSY Right   . CARDIOVERSION N/A 01/09/2018   Procedure: CARDIOVERSION;  Surgeon: Josue Hector, MD;  Location: [facility] ENDOSCOPY;  Service: Cardiovascular;  Laterality: N/A;  . CARDIOVERSION N/A 03/01/2018   Procedure: CARDIOVERSION;  Surgeon: Sueanne Margarita, MD;  Location: [facility] ENDOSCOPY;  Service: Cardiovascular;  Laterality: N/A;  . KNEE SURGERY    . LUNG LOBECTOMY Right 2000   RLL removed   HPI:  Patient is a 79 y/o female presenting to the ED on 02/14/20 due to LE weakness, numbness and falls. PMH of chronic afib on Coumadin, HTN, HFpEF, remote history of lung cancer s/p resection. CT chest showed right apical and suprahilar masses consistent with malignancy with extension into the hilum. Patient admitted for further work up.  MRI on 6/26 reported no recent infarction, hemorrhage or mass, but instead reported multiple chronic  infarcts.     Assessment / Plan / Recommendation Clinical Impression  Pt was seen for a cognitive-linguistic evaluation in the setting of chronic infarcts.  She reported that she lives at home with her husband and that she is independent with all IADLs (medications, finances, etc.) at baseline.  She reported baseline difficulty with numeric problem solving, particularly when performing mental math.  Pt completed portions of the Montreal Cognitive Assessment and the Western Aphasia Battery and she exhibited mild numeric problem solving deficits (baseline).  Additional cognitive functions and expressive/receptive language were [facility].  Low vocal intensity was noted, but no dysarthria was observed and oral mechanism exam was unremarkable.  Recommend supervision with IADLs when pt is first discharged; however, no additional skilled ST is recommended at this time.  Spoke with pt regarding results and recommendations and she verbalized understanding.  Please re-consult if additional needs arise.      SLP Assessment  SLP Recommendation/Assessment: Patient does not need any further Speech Lanaguage Pathology Services SLP Visit Diagnosis: Cognitive communication deficit (R41.841)                     SLP Evaluation Cognition  Overall Cognitive Status: Within Functional Limits for tasks assessed Arousal/Alertness: Awake/alert Orientation Level: Oriented X4 Attention: Sustained;Focused Focused Attention: Appears intact Sustained Attention: Appears intact Immediate Memory Recall: Sock;Blue;Bed Memory Recall Sock: Without Cue Memory Recall Blue: Without Cue Memory Recall Bed: Without Cue Awareness: Appears intact Problem Solving: Impaired Problem Solving Impairment: Verbal complex;Functional complex Executive Function: Reasoning;Decision Making Reasoning: Appears intact Decision  Making: Appears intact Safety/Judgment: Appears intact       Comprehension  Auditory Comprehension Overall Auditory  Comprehension: Appears within functional limits for tasks assessed Yes/No Questions: Within Functional Limits Commands: Within Functional Limits Conversation: Complex Reading Comprehension Reading Status: Not tested    Expression Expression Primary Mode of Expression: Verbal Verbal Expression Overall Verbal Expression: Appears within functional limits for tasks assessed Initiation: No impairment Level of Generative/Spontaneous Verbalization: Conversation Repetition: No impairment Naming: No impairment Pragmatics: No impairment Written Expression Dominant Hand: Right Written Expression: Not tested   Oral / Motor  Oral Motor/Sensory Function Overall Oral Motor/Sensory Function: Within functional limits Motor Speech Overall Motor Speech: Appears within functional limits for tasks assessed                      Colin Mulders M.S., CCC-SLP Acute Rehabilitation Services Office: [phone]  Elvia Collum [facility] 02/17/2020, 9:56 AM

## 2020-02-17 NOTE — Plan of Care (Signed)
  Problem: Education: Goal: Knowledge of General Education information will improve Description Including pain rating scale, medication(s)/side effects and non-pharmacologic comfort measures Outcome: Progressing   

## 2020-02-17 NOTE — Progress Notes (Signed)
HD#3 Subjective:  Overnight Events: none    Patient states that last night her legs were hurting, and that when she put them over the bed, the pain felt better. She states that the B12 shot yesterday did help quite a bit. She states that it made her feel "perky." She states that she is perky at baseline. She questions if she will be scoped today, we discussed ongoing discussion with pulmonology for her procedure. Patient voices understanding. All questions and concerns were addressed.   Objective:  Vital signs in last 24 hours: Vitals:   02/16/20 1005 02/16/20 1700 02/16/20 2034 02/17/20 0513  BP: 113/72 121/76 118/61 (!) 149/99  Pulse: 74 72 89 (!) 45  Resp: 18 20 15 15   Temp: 97.6 F (36.4 C) 97.8 F (36.6 C) 98.3 F (36.8 C) 97.8 F (36.6 C)  TempSrc: Oral Oral Oral Oral  SpO2: 96% 98% 94% 94%  Weight:   102.1 kg   Height:       Supplemental O2: Room Air SpO2: 94 %   Physical Exam:  Physical Exam Constitutional:      Appearance: Normal appearance.  HENT:     Head: Normocephalic and atraumatic.  Eyes:     Extraocular Movements: Extraocular movements intact.  Cardiovascular:     Rate and Rhythm: Normal rate.     Pulses: Normal pulses.     Heart sounds: Normal heart sounds.  Pulmonary:     Effort: Pulmonary effort is normal.     Breath sounds: Normal breath sounds.  Abdominal:     General: Bowel sounds are normal.     Palpations: Abdomen is soft.     Tenderness: There is no abdominal tenderness.  Musculoskeletal:        General: Normal range of motion.     Cervical back: Normal range of motion.     Right lower leg: No edema.     Left lower leg: No edema.  Skin:    General: Skin is warm and dry.  Neurological:     Mental Status: She is alert and oriented to person, place, and time. Mental status is at baseline.     Comments: Paresthesias bilateral lower extremities    Psychiatric:        Mood and Affect: Mood normal.     Filed Weights   02/14/20 1544  02/15/20 0017 02/16/20 2034  Weight: 99.8 kg 106.3 kg 102.1 kg     Intake/Output Summary (Last 24 hours) at 02/17/2020 0609 Last data filed at 02/17/2020 0529 Gross per 24 hour  Intake 740 ml  Output 1175 ml  Net -435 ml   Net IO Since Admission: -1,295 mL [02/17/20 0609]  Pertinent Labs: CBC Latest Ref Rng & Units 02/17/2020 02/16/2020 02/15/2020  WBC 4.0 - 10.5 K/uL 6.3 7.1 7.9  Hemoglobin 12.0 - 15.0 g/dL 13.8 13.9 14.3  Hematocrit 36 - 46 % 42.2 42.2 44.1  Platelets 150 - 400 K/uL 144(L) 158 160    CMP Latest Ref Rng & Units 02/17/2020 02/16/2020 02/15/2020  Glucose 70 - 99 mg/dL 104(H) 111(H) 99  BUN 8 - 23 mg/dL 15 13 12   Creatinine 0.44 - 1.00 mg/dL 0.86 0.91 0.82  Sodium 135 - 145 mmol/L 138 138 138  Potassium 3.5 - 5.1 mmol/L 3.2(L) 3.1(L) 3.5  Chloride 98 - 111 mmol/L 102 100 102  CO2 22 - 32 mmol/L 27 27 23   Calcium 8.9 - 10.3 mg/dL 8.9 8.7(L) 9.0  Total Protein 6.5 - 8.1 g/dL -  5.6(L) -  Total Bilirubin 0.3 - 1.2 mg/dL - 1.2 -  Alkaline Phos 38 - 126 U/L - 66 -  AST 15 - 41 U/L - 21 -  ALT 0 - 44 U/L - 25 -    Pending Labs: none  Imaging: No results found.  Assessment/Plan:   Principal Problem:   Frequent falls Active Problems:   Lung mass   Paresthesias    has a past medical history of A-fib ([place]), Hemorrhoids, Hyperlipidemia, Hypertension, Lung cancer ([place]) (2000), Obesity, Persistent atrial fibrillation ([place]), and Rheumatoid arthritis ([place]).  Patient Summary: Leah Velasquez is a 79 y.o. with a pertinent PMH of  chronic afib on Coumadin, HTN, HFpEF, RA, remote lung cancer s/p resection , who presented with progressive lower extremity weakness with recurrent falls and numbnessin herfeet and admitted for further evaluation and management.   She is on hospital day 3 and doing well.  Right Apical and Right Suprahilar Lung Mass  Satellite Lung Nodules - NPO at midnight for EBUS tomorrow - Recheck INR to make sure it is < 1.6 - Appreciate pulmonology's  recommendations  Progressive Lower Extremity Weakness  Sensory Neuropathy Patient continue to have lower extremity paresthesias.  - Continue B12 supplementation.  - PT/OT  - Continue to hold leflunomide  - TOC for SNF  HTN Continue losartan 50 mg qd  HFpEF - continue lasix 40 mg qd. Replete K as needed.  - cont. toprol 12.5 mg daily   VTE: warfarin - held, SCDs IVF: none Diet: HH Code: full  Dispo: Anticipated discharge in pending medical work-up.   Diet: NPO at midnight IVF: None,None VTE: SCDs Code: Full PT/OT recs: Pending, none. TOC recs: SNF   Dispo: Anticipated discharge to Home in 2 days pending Bronchoscopy.    Marianna Payment, D.O. [facility], PGY-1 Date 02/17/2020 Time 6:09 AM Pager: [phone] Please contact the on call pager after 5 pm and on weekends at [phone].

## 2020-02-17 NOTE — Plan of Care (Signed)
  Problem: Education: Goal: Knowledge of General Education information will improve Description: Including pain rating scale, medication(s)/side effects and non-pharmacologic comfort measures Outcome: Progressing   Problem: Activity: Goal: Risk for activity intolerance will decrease Outcome: Progressing   

## 2020-02-17 NOTE — H&P (View-Only) (Signed)
NAME:  Leah Velasquez, MRN:  [id number], DOB:  12-24-1940, LOS: 3 ADMISSION DATE:  02/14/2020, CONSULTATION DATE:  02/15/20 REFERRING MD:  Gilles Chiquito, CHIEF COMPLAINT:  RUL lung mass   Brief History   RUL lung mass. Hx of RA on arava, prednisone and A Fib on Coumadin. Prior hx of RLL lobectomy for lung cancer in [place] in 2000  History of present illness   79 year old obese cushingoid female, reports "light" smoking in her teen years through the age 56s and who in the year 2000 underwent right lower lobe lobectomy for early stage lung cancer in [place].  She is status post bilateral knee replacement with the right one in 2011 and the left one in 2014.  For the last 4 to 5 years she has been on chronic prednisonebecause of bew dx of  rheumatoid arthritis [follows up with Dr. Levada Dy Hawkes] with a new diagnosis of rheumatoid arthritis around this time.  In 2019 apparently was admitted for pneumonia and influenza and atrial fibrillation and was hospitalized for over a week.  After that has been using a walker and also has been on warfarin.  She also has been on on methotrexate for her rheumatoid arthritis but for the last 1-2 years has been on [place].  In the last few weeks she has had increased bilateral ankle pain which is caused some unsteadiness in her feet.  She was given Solu-Medrol.  Then just prior to admission she thinks he slipped and fell in her carpet.  Did not lose any consciousness.  Because of this she was admitted.  MRI of the thoracic and lumbar spine has been noncontributory but showed a right upper lobe mass.  This was followed up with a CT scan of the chest  1. Right apical subpleural and right suprahilar masses consistent with malignancy. There is extension of this mass into the right hilum. 2. Right mediastinal soft tissue mass or abnormal lymph node with extension to the right hilum. Bronchoscopy may provide better evaluation. 3. A cluster of nodular density in the right upper lobe  extending to the right lateral pleural surface and along the right minor fissure most consistent with satellite nodules. 4. Aortic Atherosclerosis (ICD10-I70.0) and Emphysema (I  On coumadni at home Not on aspirinl or plavix  Interim history/subjective:   Reviewed CT findings with the patient and daughters at bedside 6/28 No new issues reported Coumadin remains on hold  Objective   Blood pressure 140/66, pulse 78, temperature 97.9 F (36.6 C), temperature source Oral, resp. rate 16, height 5\' 1"  (1.549 m), weight 102.1 kg, SpO2 96 %.        Intake/Output Summary (Last 24 hours) at 02/17/2020 1433 Last data filed at 02/17/2020 0900 Gross per 24 hour  Intake 420 ml  Output 950 ml  Net -530 ml   Filed Weights   02/14/20 1544 02/15/20 0017 02/16/20 2034  Weight: 99.8 kg 106.3 kg 102.1 kg    Examination: General: Obese woman, in bed, comfortable, cushingoid facies HENT: M3 airway, no lymphadenopathy, oropharynx clear, strong voice Lungs: Decreased at both bases, clear bilaterally Cardiovascular: Irregularly irregular, no murmur Abdomen: Nondistended, positive bowel sounds Extremities: No edema Neuro: Awake, alert, interacting appropriately, moves all extremities, follows commands  Resolved Hospital Problem list   X  Assessment & Plan:   Right upper lobe pulmonary nodules with enlarged right hilar lymph node.  Clinical picture concerning for malignancy.  Consider also contribution of rheumatoid nodular disease although the dominant nodules in the  right upper lobe do not look like typical rheumatoid.  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  She has a history of lung cancer and right lower lobectomy in the past. I discussed the CT findings with the patient and her daughters at bedside.  We discussed the pros and cons of proceeding with bronchoscopy now versus waiting and watching with a repeat  CT chest in 3 months.  Risks and benefits of bronchoscopy discussed.  She elects to proceed with bronchoscopy under general anesthesia.  Her Coumadin is on hold.  We will work on scheduling for 6/29     Best practice:  Diet: will need to be NPO night before procedure Code status - full Family Communication: spoke with the patient and daughters at bedside Disposition: floor per primary MD    SIGNATURE   Baltazar Apo, MD, PhD 02/17/2020, 2:34 PM [facility] [phone] or if no answer [phone]    LABS    PULMONARY No results for input(s): PHART, PCO2ART, PO2ART, HCO3, TCO2, O2SAT in the last 168 hours.  Invalid input(s): PCO2, PO2  CBC Recent Labs  Lab 02/15/20 0124 02/16/20 0431 02/17/20 0455  HGB 14.3 13.9 13.8  HCT 44.1 42.2 42.2  WBC 7.9 7.1 6.3  PLT 160 158 144*    COAGULATION Recent Labs  Lab 02/14/20 1857 02/15/20 0124 02/16/20 0431 02/17/20 0455  INR 2.5* 2.1* 1.9* 1.5*    CARDIAC  No results for input(s): TROPONINI in the last 168 hours. No results for input(s): PROBNP in the last 168 hours.   CHEMISTRY Recent Labs  Lab 02/14/20 1644 02/14/20 1644 02/15/20 0121 02/15/20 0121 02/16/20 0431 02/17/20 0455  NA 138  --  138  --  138 138  K 3.6   < > 3.5   < > 3.1* 3.2*  CL 102  --  102  --  100 102  CO2 24  --  23  --  27 27  GLUCOSE 89  --  99  --  111* 104*  BUN 12  --  12  --  13 15  CREATININE 0.81  --  0.82  --  0.91 0.86  CALCIUM 9.2  --  9.0  --  8.7* 8.9  MG  --   --  1.9  --   --   --   PHOS  --   --  3.0  --   --   --    < > = values in this interval not displayed.   Estimated Creatinine Clearance: 60.1 mL/min (by C-G formula based on SCr of 0.86 mg/dL).   LIVER Recent Labs  Lab 02/14/20 1644 02/14/20 1857 02/15/20 0124 02/16/20 0431 02/17/20 0455  AST 29  --   --  21  --   ALT 25  --   --  25  --   ALKPHOS 62  --   --  66  --   BILITOT 1.2  --   --  1.2  --   PROT 5.7*  --   --  5.6*   --   ALBUMIN 3.2*  --   --  2.8*  --   INR  --  2.5* 2.1* 1.9* 1.5*

## 2020-02-18 ENCOUNTER — Inpatient Hospital Stay (HOSPITAL_COMMUNITY): Payer: Medicare HMO | Admitting: Anesthesiology

## 2020-02-18 ENCOUNTER — Encounter (HOSPITAL_COMMUNITY): Payer: Self-pay | Admitting: Internal Medicine

## 2020-02-18 ENCOUNTER — Encounter (HOSPITAL_COMMUNITY)
Admission: RE | Disposition: A | Discharge status: Skilled Nursing Facility | Payer: Self-pay | Source: Home / Self Care | Attending: Internal Medicine

## 2020-02-18 ENCOUNTER — Inpatient Hospital Stay (HOSPITAL_COMMUNITY): Payer: Medicare HMO

## 2020-02-18 HISTORY — PX: BRONCHIAL BRUSHINGS: SHX5108

## 2020-02-18 HISTORY — PX: BRONCHIAL NEEDLE ASPIRATION BIOPSY: SHX5106

## 2020-02-18 HISTORY — PX: ELECTROMAGNETIC NAVIGATION BROCHOSCOPY: SHX5369

## 2020-02-18 HISTORY — PX: BRONCHIAL WASHINGS: SHX5105

## 2020-02-18 HISTORY — PX: BRONCHIAL BIOPSY: SHX5109

## 2020-02-18 HISTORY — PX: VIDEO BRONCHOSCOPY: SHX5072

## 2020-02-18 LAB — BASIC METABOLIC PANEL
Anion gap: 10 (ref 5–15)
BUN: 14 mg/dL (ref 8–23)
CO2: 27 mmol/L (ref 22–32)
Calcium: 8.7 mg/dL — ABNORMAL LOW (ref 8.9–10.3)
Chloride: 99 mmol/L (ref 98–111)
Creatinine, Ser: 0.87 mg/dL (ref 0.44–1.00)
GFR calc Af Amer: >60 mL/min (ref >60)
GFR calc non Af Amer: >60 mL/min (ref >60)
Glucose, Bld: 114 mg/dL — ABNORMAL HIGH (ref 70–99)
Potassium: 3.1 mmol/L — ABNORMAL LOW (ref 3.5–5.1)
Sodium: 136 mmol/L (ref 135–145)

## 2020-02-18 LAB — CBC
HCT: 41.8 % (ref 36.0–46.0)
Hemoglobin: 13.4 g/dL (ref 12.0–15.0)
MCH: 30.7 pg (ref 26.0–34.0)
MCHC: 32.1 g/dL (ref 30.0–36.0)
MCV: 95.7 fL (ref 80.0–100.0)
Platelets: 142 10*3/uL — ABNORMAL LOW (ref 150–400)
RBC: 4.37 MIL/uL (ref 3.87–5.11)
RDW: 14.2 % (ref 11.5–15.5)
WBC: 5.8 10*3/uL (ref 4.0–10.5)
nRBC: 0 % (ref 0.0–0.2)

## 2020-02-18 LAB — PROTIME-INR
INR: 1.3 — ABNORMAL HIGH (ref 0.8–1.2)
Prothrombin Time: 15.7 seconds — ABNORMAL HIGH (ref 11.4–15.2)

## 2020-02-18 LAB — GLUCOSE, CAPILLARY
Glucose-Capillary: 110 mg/dL — ABNORMAL HIGH (ref 70–99)
Glucose-Capillary: 105 mg/dL — ABNORMAL HIGH (ref 70–99)
Glucose-Capillary: 114 mg/dL — ABNORMAL HIGH (ref 70–99)

## 2020-02-18 IMAGING — DX DG CHEST 1V PORT
1 series · 1 of 1 positions shown · non-contrast
Comparison: Chest CT [DATE]

CLINICAL DATA: Status post bronchoscopy

EXAM:
PORTABLE CHEST 1 VIEW

[chest ap]
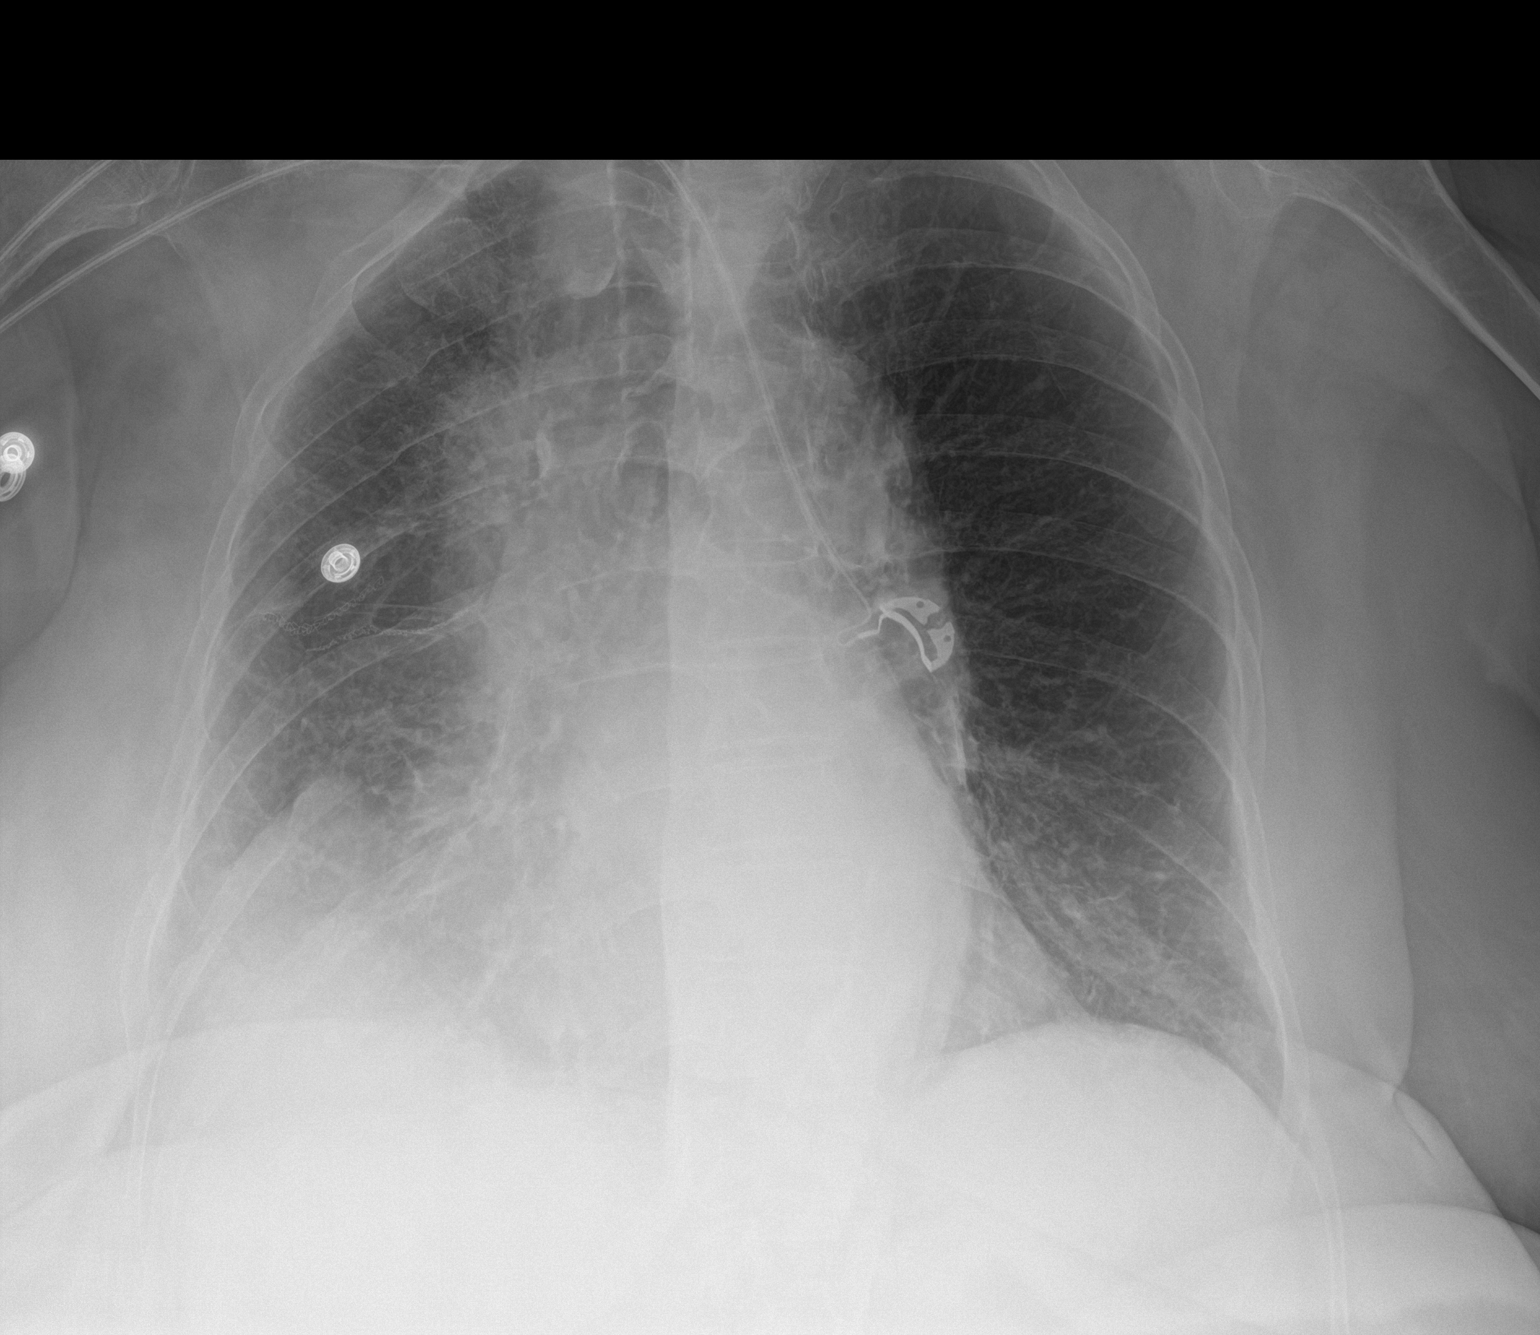

[1 of 1 positions shown; findings below may reference images not displayed]

FINDINGS: No pneumothorax. There is postoperative change on the right with
volume loss. The pulmonary nodular opacities seen along the medial
right apex and right medial suprahilar regions are less evident than
on recent CT. Ill-defined opacities in these areas is present. The
left lung is clear. Heart size and pulmonary vascular normal. No
adenopathy. Adenopathy evident on recent CT is not well appreciated
by radiography.
IMPRESSION: No pneumothorax. Ill-defined opacity right apex and right suprahilar
regions medially present but better delineated on recent CT. Volume
loss of postoperative change noted on the right. No new opacity
evident. Left lung clear. Cardiac silhouette normal. Adenopathy seen
recent CT not well seen by portable radiography examination.

## 2020-02-18 SURGERY — BRONCHOSCOPY, WITH FLUOROSCOPY
Anesthesia: General

## 2020-02-18 MED ORDER — PHENYLEPHRINE HCL-NACL 10-0.9 MG/250ML-% IV SOLN
INTRAVENOUS | Status: DC | PRN
Start: 2020-02-18 — End: 2020-02-18
  Administered 2020-02-18: 50 ug/min via INTRAVENOUS

## 2020-02-18 MED ORDER — LIDOCAINE 2% (20 MG/ML) 5 ML SYRINGE
INTRAMUSCULAR | Status: DC | PRN
  Administered 2020-02-18: 80 mg via INTRAVENOUS

## 2020-02-18 MED ORDER — LOSARTAN POTASSIUM 50 MG PO TABS: 50.0000 mg | ORAL_TABLET | Freq: Every day | ORAL | Status: DC

## 2020-02-18 MED ORDER — FENTANYL CITRATE (PF) 100 MCG/2ML IJ SOLN: 25.0000 ug | INTRAMUSCULAR | Status: DC | PRN

## 2020-02-18 MED ORDER — ONDANSETRON HCL 4 MG/2ML IJ SOLN: 4.0000 mg | Freq: Once | INTRAMUSCULAR | Status: DC | PRN

## 2020-02-18 MED ORDER — CHLORHEXIDINE GLUCONATE 0.12 % MT SOLN
OROMUCOSAL | Status: AC
  Administered 2020-02-18: 15 mL via OROMUCOSAL
  Filled 2020-02-18: qty 15

## 2020-02-18 MED ORDER — PROPOFOL 10 MG/ML IV BOLUS
INTRAVENOUS | Status: DC | PRN
  Administered 2020-02-18: 100 mg via INTRAVENOUS

## 2020-02-18 MED ORDER — POTASSIUM CHLORIDE CRYS ER 20 MEQ PO TBCR
40.0000 meq | EXTENDED_RELEASE_TABLET | ORAL | Status: AC
  Administered 2020-02-18 (×2): 40 meq via ORAL
  Filled 2020-02-18 (×2): qty 2

## 2020-02-18 MED ORDER — ROCURONIUM BROMIDE 10 MG/ML (PF) SYRINGE
PREFILLED_SYRINGE | INTRAVENOUS | Status: DC | PRN
  Administered 2020-02-18: 50 mg via INTRAVENOUS

## 2020-02-18 MED ORDER — ONDANSETRON HCL 4 MG/2ML IJ SOLN
INTRAMUSCULAR | Status: DC | PRN
  Administered 2020-02-18: 4 mg via INTRAVENOUS

## 2020-02-18 MED ORDER — LACTATED RINGERS IV SOLN: INTRAVENOUS | Status: DC | PRN

## 2020-02-18 MED ORDER — FENTANYL CITRATE (PF) 100 MCG/2ML IJ SOLN
INTRAMUSCULAR | Status: DC | PRN
  Administered 2020-02-18: 50 ug via INTRAVENOUS

## 2020-02-18 MED ORDER — PHENYLEPHRINE HCL (PRESSORS) 10 MG/ML IV SOLN
INTRAVENOUS | Status: DC | PRN
  Administered 2020-02-18 (×2): 120 ug via INTRAVENOUS

## 2020-02-18 MED ORDER — CHLORHEXIDINE GLUCONATE 0.12 % MT SOLN: 15.0000 mL | Freq: Once | OROMUCOSAL | Status: AC

## 2020-02-18 MED ORDER — LACTATED RINGERS IV SOLN: INTRAVENOUS | Status: DC

## 2020-02-18 MED ORDER — SUGAMMADEX SODIUM 200 MG/2ML IV SOLN
INTRAVENOUS | Status: DC | PRN
  Administered 2020-02-18: 400 mg via INTRAVENOUS

## 2020-02-18 NOTE — Progress Notes (Signed)
NCM received consult for PCP. NCM spoke with pt @ bedside. Pt shared she does have a PCP, Suzanna Obey. Pt states she's interested in a Internal Medicine MD. States current PCP is a Family Medicine MD. NCM did share with pt she can refer to back of insurance card and call [phone] # to help navigate search for PCP ( Internist). NCM will google IM doctors in [place], print list and provide to patient. Whitman Hero RN,BSN,CM [phone]

## 2020-02-18 NOTE — TOC Initial Note (Signed)
Transition of Care [facility]) - Initial/Assessment Note    Patient Details  Name: Leah Velasquez MRN: [id number] Date of Birth: 1941/05/24  Transition [facility]) CM/SW Contact:    Sable Feil, LCSW Phone Number: 02/18/2020, 1:06 PM  Clinical Narrative: On 6/28 talked with patient and daughters [name] and Laurette Schimke at bedside regarding patient's discharge plan. Daughters from [place]. [name] was sitting up in bed and was awake, alert, and greeted CSW upon entry to the room. She allowed her daughters to actively participate in the discussion regarding her d/c disposition. Patient and daughters in agreement with ST rehab and CSW explained the facility search process and provided them with a Medicare.gov SNF list.   CSW talked briefly with daughters on 6/29 and they gave [facility] as their facility preference.                Expected Discharge Plan: Skilled Nursing Facility Barriers to Discharge: Continued Medical Work up   Patient Goals and CMS Choice Patient states their goals for this hospitalization and ongoing recovery are:: Patient and daughters agreeable to SNF placement CMS Medicare.gov Compare Post Acute Care list provided to:: Patient Represenative (must comment) (Medicare.gov SNF list provided to daughters at bedside on 02/17/20) Choice offered to / list presented to : Patient, Adult Children  Expected Discharge Plan and Services Expected Discharge Plan: [place] In-house Referral: Clinical Social Work     Living arrangements for the past 2 months: Single Family Home                                     Prior Living Arrangements/Services Living arrangements for the past 2 months: Single Family Home Lives with:: Spouse Patient language and need for interpreter reviewed:: No Do you feel safe going back to the place where you live?: No   Patient and daughters in agreement with ST rehab  Need for Family Participation in Patient  Care: Yes (Comment) Care giver support system in place?: No (comment)   Criminal Activity/Legal Involvement Pertinent to Current Situation/Hospitalization: No - Comment as needed  Activities of Daily Living Home Assistive Devices/Equipment: None ADL Screening (condition at time of admission) Patient's cognitive ability adequate to safely complete daily activities?: Yes Is the patient deaf or have difficulty hearing?: No Does the patient have difficulty seeing, even when wearing glasses/contacts?: No Does the patient have difficulty concentrating, remembering, or making decisions?: No Patient able to express need for assistance with ADLs?: Yes Does the patient have difficulty dressing or bathing?: No Independently performs ADLs?: Yes (appropriate for developmental age) Does the patient have difficulty walking or climbing stairs?: Yes Weakness of Legs: Both Weakness of Arms/Hands: None  Permission Sought/Granted Permission sought to share information with : Family Supports Permission granted to share information with : Yes, Verbal Permission Granted  Share Information with NAME: [name]     Permission granted to share info w Relationship: Daughter  Permission granted to share info w Contact Information: [phone]  Emotional Assessment Appearance:: Appears stated age Attitude/Demeanor/Rapport: Other (comment) (Patient was attentive and quiet. Allowed daughters to actively participate in the conversation) Affect (typically observed): Pleasant Orientation: : Oriented to Self, Oriented to Place, Oriented to  Time, Oriented to Situation Alcohol / Substance Use: Tobacco Use, Alcohol Use, Illicit Drugs (Per H&P patient quit smoking and does not drink or use illicit drugs) Psych Involvement: No (comment)  Admission diagnosis:  Lung mass [R91.8] Frequent falls [R29.6] Patient Active Problem List   Diagnosis Date Noted  . Paresthesias 02/15/2020  . Frequent falls   . Lung mass  02/14/2020  . Long term (current) use of anticoagulants 06/04/2018  . CAP (community acquired pneumonia) 10/22/2017  . Acute respiratory failure with hypoxia ([place]) 10/22/2017  . Atrial fibrillation ([place]) 10/22/2017  . Generalized weakness 10/22/2017  . Essential hypertension 10/22/2017  . Thrombocytopenia ([place]) 10/22/2017  . Hyperlipidemia 10/22/2017   PCP:  [name], MD Pharmacy:   [id number] [place], [address] [place] [address] Phone: [phone] Fax: [facility] [place], [address] [place] [address] Phone: [phone] Fax: [phone]   Social Determinants of Health (SDOH) Interventions  No SDOH interventions requested or needed at this time.  Readmission Risk Interventions No flowsheet data found.

## 2020-02-18 NOTE — TOC Progression Note (Signed)
Transition of Care [facility]) - Progression Note    Patient Details  Name: AWANDA WILCOCK MRN: [id number] Date of Birth: 1941-05-31  Transition of Care [facility]) CM/SW Contact  Sharlet Salina Mila Homer, LCSW Phone Number: 02/18/2020, 5:37 PM  Clinical Narrative:  Talked with patient and daughters [name] and Laurette Schimke at bedside (3:45 pm) regarding SNF placement.  Facility responses provided and patient/daughters also provided with names of SNF's that declined and did not respond. Daughter's preference was [facility] and they were advised that facility admissions director was contacted and they could not accept patient as no bed availability at this time. Daughters asked questions regarding [facility] and call made to Freda Munro, Engineer, site and daughters talked to her via speaker phone. They will visit [facility] tomorrow to talk with the admissions director and may go by the other facilities that made bed offers.  Daughters advised that a SNF choice will need to be made tomorrow as insurance auth will need to be initiated and daughters expressed understanding.     Expected Discharge Plan: [place] Barriers to Discharge: Continued Medical Work up  Expected Discharge Plan and Services Expected Discharge Plan: [place] In-house Referral: Clinical Social Work     Living arrangements for the past 2 months: Single Family Home                                       Social Determinants of Health (SDOH) Interventions    Readmission Risk Interventions No flowsheet data found.

## 2020-02-18 NOTE — Progress Notes (Signed)
Physical Therapy Treatment Patient Details Name: Leah Velasquez MRN: [id number] DOB: 12-Jan-1941 Today's Date: 02/18/2020    History of Present Illness Patient is a 79 y/o female presenting to the ED on 02/14/20 due to LE weakness, numbness and falls. PMH of chronic afib on Coumadin, HTN, HFpEF, remote history of lung cancer s/p resection. CT chest showed right apical and suprahilar masses consistent with malignancy with extension into the hilum. Patient admitted for further work up.     PT Comments    Patient progressing to ambulation in room, but limited when fatigued and felt knees buckle and pt anxious also as transport arrived to take to bronchoscopy.  Daughters also arrived and supportive, but wanting information about local skilled facilities.  Deferred to SW and online resources.  PT to follow acutely.    Follow Up Recommendations  SNF     Equipment Recommendations  None recommended by PT    Recommendations for Other Services       Precautions / Restrictions Precautions Precautions: Fall    Mobility  Bed Mobility Overal bed mobility: Needs Assistance Bed Mobility: Rolling;Sidelying to Sit;Sit to Supine Rolling: Mod assist Sidelying to sit: Mod assist   Sit to supine: Mod assist   General bed mobility comments: cues for technique, assist for lifting trunk and guiding legs  Transfers Overall transfer level: Needs assistance Equipment used: Rolling walker (2 wheeled) Transfers: Sit to/from [facility] Sit to Stand: Mod assist Stand pivot transfers: Min assist       General transfer comment: some lifting help from bed, stand step to [facility] with RW and min A then up from [facility] with min A and help do don briefs  Ambulation/Gait Ambulation/Gait assistance: Min assist;Mod assist Gait Distance (Feet): 12 Feet Assistive device: Rolling walker (2 wheeled) Gait Pattern/deviations: Step-to pattern;Decreased stride length;Shuffle;Trunk flexed;Wide base of  support     General Gait Details: willing to walk further, but then felt her knees buckled and anxious as transport arrived to take her for bronch, reaching for foot board on bed, cues to keep holding walker and assist increased as she felt more weak   Stairs             Wheelchair Mobility    Modified Rankin (Stroke Patients Only)       Balance Overall balance assessment: Needs assistance Sitting-balance support: Feet supported Sitting balance-Leahy Scale: Fair Sitting balance - Comments: static sitting EOB with S, dynamic movement to adjust gait belt, LOB posterior needing to help with UE's to regain balance   Standing balance support: Bilateral upper extremity supported Standing balance-Leahy Scale: Poor Standing balance comment: reliant on external support                            Cognition Arousal/Alertness: Awake/alert Behavior During Therapy: WFL for tasks assessed/performed Overall Cognitive Status: Within Functional Limits for tasks assessed                                        Exercises General Exercises - Lower Extremity Ankle Circles/Pumps: AROM;10 reps;Both;Supine Heel Slides: AROM;AAROM;Both;5 reps;Supine    General Comments General comments (skin integrity, edema, etc.): Daughters entered the room asking about local SNF options as [place], discussed need for strong advocate and for pt to advocate for herself as well.  Pertinent Vitals/Pain Pain Assessment: Faces Faces Pain Scale: Hurts little more Pain Location: achy in knees and ankles Pain Descriptors / Indicators: Aching;Guarding Pain Intervention(s): Monitored during session;Limited activity within patient's tolerance;Repositioned    Home Living                      Prior Function            PT Goals (current goals can now be found in the care plan section) Progress towards PT goals: Progressing toward goals     Frequency    Min 2X/week      PT Plan Current plan remains appropriate    Co-evaluation              AM-PAC PT "6 Clicks" Mobility   Outcome Measure  Help needed turning from your back to your side while in a flat bed without using bedrails?: A Lot Help needed moving from lying on your back to sitting on the side of a flat bed without using bedrails?: A Lot Help needed moving to and from a bed to a chair (including a wheelchair)?: A Lot Help needed standing up from a chair using your arms (e.g., wheelchair or bedside chair)?: A Lot Help needed to walk in hospital room?: A Lot Help needed climbing 3-5 steps with a railing? : Total 6 Click Score: 11    End of Session Equipment Utilized During Treatment: Gait belt Activity Tolerance: Patient limited by fatigue Patient left: in bed;with family/visitor present;Other (comment) (transport present to take pt to test)   PT Visit Diagnosis: Unsteadiness on feet (R26.81);Other abnormalities of gait and mobility (R26.89);Muscle weakness (generalized) (M62.81);History of falling (Z91.81)     Time: 1740-8144 PT Time Calculation (min) (ACUTE ONLY): 34 min  Charges:  $Gait Training: 8-22 mins $Therapeutic Activity: 8-22 mins                     Magda Kiel, PT Acute Rehabilitation Services [phone] Office:[phone] 02/18/2020    Reginia Naas 02/18/2020, 1:43 PM

## 2020-02-18 NOTE — Anesthesia Procedure Notes (Signed)
Procedure Name: Intubation Date/Time: 02/18/2020 12:31 PM Performed by: Neldon Newport, CRNA Pre-anesthesia Checklist: Timeout performed, Patient being monitored, Suction available, Emergency Drugs available and Patient identified Patient Re-evaluated:Patient Re-evaluated prior to induction Oxygen Delivery Method: Circle system utilized Preoxygenation: Pre-oxygenation with 100% oxygen Induction Type: IV induction Ventilation: Mask ventilation without difficulty Laryngoscope Size: McGraph and 3 Grade View: Grade I Tube type: Oral Tube size: 8.5 mm Number of attempts: 3 Airway Equipment and Method: Bougie stylet Placement Confirmation: ETT inserted through vocal cords under direct vision,  positive ETCO2 and breath sounds checked- equal and bilateral Secured at: 22 cm Tube secured with: Tape Dental Injury: Teeth and Oropharynx as per pre-operative assessment  Difficulty Due To: Difficulty was anticipated Comments: Have Bougie stylette handy.  Small mouth and difficulty passing tube by teeth.

## 2020-02-18 NOTE — Interval H&P Note (Signed)
History and Physical Interval Note:  02/18/2020 12:18 PM  Leah Velasquez  has presented today for surgery, with the diagnosis of pulmonary nodule.  The various methods of treatment have been discussed with the patient and family. After consideration of risks, benefits and other options for treatment, the patient has consented to  Procedure(s): VIDEO BRONCHOSCOPY WITH FLUORO (N/A) ELECTROMAGNETIC NAVIGATION BRONCHOSCOPY (N/A) ENDOBRONCHIAL ULTRASOUND (N/A) as a surgical intervention.  The patient's history has been reviewed, patient examined, no change in status, stable for surgery.  I have reviewed the patient's chart and labs.  Questions were answered to the patient's satisfaction.     Collene Gobble

## 2020-02-18 NOTE — NC FL2 (Signed)
[place] LEVEL OF CARE SCREENING TOOL     IDENTIFICATION  Patient Name: Leah Velasquez Birthdate: [birth year] Sex: female Admission Date (Current Location): 02/14/2020  [facility] and [place] Number:  Herbalist and Address:  [facility] [place] [address] [place], [place] [address]      Provider Number: [id number]  Attending Physician Name and Address:  Aldine Contes, MD  Relative Name and Phone Number:  [name] - daughter (1st contact) [phone]; Siri Cole Rehbein-daughter - [phone]    Current Level of Care: Hospital Recommended Level of Care: [place] Prior Approval Number:    Date Approved/Denied:   PASRR Number: [id number] (MUST ID [id number].)  Discharge Plan: SNF    Current Diagnoses: Patient Active Problem List   Diagnosis Date Noted  . Paresthesias 02/15/2020  . Frequent falls   . Lung mass 02/14/2020  . Long term (current) use of anticoagulants 06/04/2018  . CAP (community acquired pneumonia) 10/22/2017  . Acute respiratory failure with hypoxia ([place]) 10/22/2017  . Atrial fibrillation ([place]) 10/22/2017  . Generalized weakness 10/22/2017  . Essential hypertension 10/22/2017  . Thrombocytopenia ([place]) 10/22/2017  . Hyperlipidemia 10/22/2017    Orientation RESPIRATION BLADDER Height & Weight     Self, Time, Situation, Place  Normal Incontinent, External catheter Weight: 226 lb (102.5 kg) Height:  5\' 1"  (154.9 cm)  BEHAVIORAL SYMPTOMS/MOOD NEUROLOGICAL BOWEL NUTRITION STATUS      Incontinent Diet (Currently NPO)  AMBULATORY STATUS COMMUNICATION OF NEEDS Skin     Verbally Other (Comment) (Eccymosis Abdomen and arm)                       Personal Care Assistance Level of Assistance  Bathing, Feeding, Dressing Bathing Assistance: Maximum assistance (Upper body - min assist; Lower body max assist) Feeding assistance: Independent Dressing Assistance: Maximum assistance (Upper body mn  assist; Lower body max assist)     Functional Limitations Info  Sight, Hearing, Speech Sight Info: Adequate Hearing Info: Adequate Speech Info: Adequate    SPECIAL CARE FACTORS FREQUENCY  PT (By licensed PT), OT (By licensed OT), Speech therapy     PT Frequency: Ealuated 6/27. PT at [facility] Eval and Treat, a minimum of 5 days per week OT Frequency: Evaluated 6/28. OT at [facility] Eval and Treat, a minimum of 5 days per week     Speech Therapy Frequency: Evaluated 6/28 - ongoing speech services not needed      Contractures Contractures Info: Not present    Additional Factors Info  Code Status, Allergies Code Status Info: Ful Allergies Info: Morphine, Morphine And Related, Morphine And Related, Sulfa Antibiotics, Sulfa Antibiotics, Sulfa Antibiotics           Current Medications (02/18/2020):  This is the current hospital active medication list Current Facility-Administered Medications  Medication Dose Route Frequency Provider Last Rate Last Admin  . [MAR Hold] acetaminophen (TYLENOL) tablet 650 mg  650 mg Oral Q6H PRN Seawell, Jaimie A, DO      . [MAR Hold] albuterol (PROVENTIL) (2.5 MG/3ML) 0.083% nebulizer solution 3 mL  3 mL Inhalation Q6H PRN Madalyn Rob, MD      . Doug Sou Hold] calcium carbonate (TUMS - dosed in mg elemental calcium) chewable tablet 750 mg  750 mg Oral Daily PRN Madalyn Rob, MD      . Doug Sou Hold] cholecalciferol (VITAMIN D3) tablet 1,000 Units  1,000 Units Oral Daily Byrum, Rose Fillers, MD      . [  MAR Hold] conjugated estrogens (PREMARIN) vaginal cream 1 Applicatorful  1 Applicatorful Vaginal Once per day on Mon Thu Steen, James, MD   1 Applicatorful at 16/07/37 1745  . [MAR Hold] furosemide (LASIX) tablet 20 mg  20 mg Oral Daily Collene Gobble, MD      . lactated ringers infusion   Intravenous Continuous Catalina Gravel, MD      . Doug Sou Hold] metoprolol succinate (TOPROL-XL) 24 hr tablet 12.5 mg  12.5 mg Oral Daily Madalyn Rob, MD   12.5 mg at 02/18/20 1062  .  [MAR Hold] pantoprazole (PROTONIX) EC tablet 40 mg  40 mg Oral Daily Collene Gobble, MD      . Doug Sou Hold] simvastatin (ZOCOR) tablet 20 mg  20 mg Oral QHS Madalyn Rob, MD   20 mg at 02/17/20 2102  . [MAR Hold] timolol (TIMOPTIC) 0.5 % ophthalmic solution 1 drop  1 drop Both Eyes BID Seawell, Jaimie A, DO   1 drop at 02/18/20 0829  . [MAR Hold] traMADol (ULTRAM) tablet 50 mg  50 mg Oral Q6H PRN Madalyn Rob, MD   50 mg at 02/18/20 0827  . [MAR Hold] vitamin B-12 (CYANOCOBALAMIN) tablet 1,000 mcg  1,000 mcg Oral Daily Byrum, Rose Fillers, MD      . Doug Sou Hold] zolpidem [facility]) tablet 5 mg  5 mg Oral QHS PRN Madalyn Rob, MD   5 mg at 02/17/20 2101     Discharge Medications: Please see discharge summary for a list of discharge medications.  Relevant Imaging Results:  Relevant Lab Results:   Additional Information [phone]  Sable Feil, LCSW

## 2020-02-18 NOTE — Op Note (Signed)
Video Bronchoscopy with Electromagnetic Navigation Procedure Note  Date of Operation: 02/18/2020  Pre-op Diagnosis: Right upper lobe nodules, right hilar adenopathy  Post-op Diagnosis: Same  Surgeon: Baltazar Apo  Assistants: None  Anesthesia: General endotracheal anesthesia  Operation: Flexible video fiberoptic bronchoscopy with electromagnetic navigation and biopsies.  Estimated Blood Loss: Less than 20 cc  Complications: None apparent  Indications and History: Leah Velasquez is a 79 y.o. female with history of tobacco, prior lung cancer (right middle lobe lobectomy).  She was found to have 2 distinct right upper lobe pulmonary nodules and a right hilar enlarged lymph node on CT scan of the chest during this hospital admission.  Recommendation was made to achieve a tissue diagnosis via occasional bronchoscopy, possible endobronchial ultrasound (ultimately not required).  The risks, benefits, complications, treatment options and expected outcomes were discussed with the patient.  The possibilities of pneumothorax, pneumonia, reaction to medication, pulmonary aspiration, perforation of a viscus, bleeding, failure to diagnose a condition and creating a complication requiring transfusion or operation were discussed with the patient who freely signed the consent.    Description of Procedure: The patient was seen in the Preoperative Area, was examined and was deemed appropriate to proceed.  The patient was taken to [facility] endoscopy room 2, identified as ADDYSYN FERN and the procedure verified as Flexible Video Fiberoptic Bronchoscopy.  A Time Out was held and the above information confirmed.   Prior to the date of the procedure a high-resolution CT scan of the chest was performed. Utilizing [place] a virtual tracheobronchial tree was generated to allow the creation of distinct navigation pathways to the patient's parenchymal abnormalities. After being taken to the operating  room general anesthesia was initiated and the patient  was orally intubated. The video fiberoptic bronchoscope was introduced via the endotracheal tube and a general inspection was performed which showed normal airways on the left.  The apical segment of the right upper lobe airway was erythematous, irregular, narrowed.  The scope would not pass into the apical segment.  The anterior and posterior segments were patent.  The bronchus intermedius, right middle lobe and right lower lobe airways were all normal in appearance.. The extendable working channel and locator guide were introduced into the bronchoscope. The distinct navigation pathways prepared prior to this procedure were then utilized to navigate to within 0.4 cm of patient's more proximal right upper lobe lesion identified on CT scan. The extendable working channel was secured into place and the locator guide was withdrawn. Under fluoroscopic guidance transbronchial needle brushings, transbronchial Wang needle biopsies, and transbronchial forceps biopsies were performed to be sent for cytology and pathology. A bronchioalveolar lavage was performed in the apical segment of the right upper lobe and sent for cytology.. At the end of the procedure a general airway inspection was performed and there was no evidence of active bleeding. The bronchoscope was removed.  The patient tolerated the procedure well. There was no significant blood loss and there were no obvious complications. A post-procedural chest x-ray is pending.  Samples: 1. Transbronchial needle brushings from right upper lobe nodule 2. Transbronchial Wang needle biopsies from right upper lobe nodule 3. Transbronchial forceps biopsies from right upper lobe nodule 4. Bronchoalveolar lavage from apical segment of the right upper lobe  Plans:  The patient will be discharged from the PACU to home when recovered from anesthesia and after chest x-ray is reviewed. We will review the cytology,  pathology and microbiology results with the patient when they  become available. Outpatient followup will be with Dr Lamonte Sakai.    Baltazar Apo, MD, PhD 02/18/2020, 1:58 PM [facility] [phone] or if no answer [phone]

## 2020-02-18 NOTE — Progress Notes (Signed)
HD#4 Subjective:  Overnight Events: no overnight events. Dose have some lower blood pressure this morning.   Patient resting comfortably in bed. She admits to numbness and tingling in her legs that has been persistent since her admission. Otherwise she denies new symptoms at this time. She states that she is ready to get the bronchoscopy over with.   Objective:  Vital signs in last 24 hours: Vitals:   02/17/20 1900 02/17/20 2053 02/18/20 0454 02/18/20 0841  BP:  115/73 99/64 115/61  Pulse:  78 90 77  Resp:  15 14 18   Temp:  97.8 F (36.6 C) 97.6 F (36.4 C) (!) 97.5 F (36.4 C)  TempSrc:  Oral Oral Oral  SpO2:  93% 95% 93%  Weight: 102.5 kg     Height:       Supplemental O2: Room Air SpO2: 93 %   Physical Exam:  Physical Exam Constitutional:      Appearance: Normal appearance.  HENT:     Head: Normocephalic and atraumatic.  Eyes:     Extraocular Movements: Extraocular movements intact.  Cardiovascular:     Rate and Rhythm: Normal rate.     Pulses: Normal pulses.     Heart sounds: Normal heart sounds.  Pulmonary:     Effort: Pulmonary effort is normal.     Breath sounds: Normal breath sounds.  Abdominal:     General: Bowel sounds are normal.     Palpations: Abdomen is soft.     Tenderness: There is no abdominal tenderness.  Musculoskeletal:        General: Normal range of motion.     Cervical back: Normal range of motion.     Right lower leg: No edema.     Left lower leg: No edema.  Skin:    General: Skin is warm and dry.  Neurological:     Mental Status: She is alert and oriented to person, place, and time. Mental status is at baseline.  Psychiatric:        Mood and Affect: Mood normal.     Filed Weights   02/15/20 0017 02/16/20 2034 02/17/20 1900  Weight: 106.3 kg 102.1 kg 102.5 kg     Intake/Output Summary (Last 24 hours) at 02/18/2020 0946 Last data filed at 02/18/2020 0505 Gross per 24 hour  Intake 460 ml  Output 1000 ml  Net -540 ml    Net IO Since Admission: -1,625 mL [02/18/20 0946]  Pertinent Labs: CBC Latest Ref Rng & Units 02/18/2020 02/17/2020 02/16/2020  WBC 4.0 - 10.5 K/uL 5.8 6.3 7.1  Hemoglobin 12.0 - 15.0 g/dL 13.4 13.8 13.9  Hematocrit 36 - 46 % 41.8 42.2 42.2  Platelets 150 - 400 K/uL 142(L) 144(L) 158    CMP Latest Ref Rng & Units 02/18/2020 02/17/2020 02/16/2020  Glucose 70 - 99 mg/dL 114(H) 104(H) 111(H)  BUN 8 - 23 mg/dL 14 15 13   Creatinine 0.44 - 1.00 mg/dL 0.87 0.86 0.91  Sodium 135 - 145 mmol/L 136 138 138  Potassium 3.5 - 5.1 mmol/L 3.1(L) 3.2(L) 3.1(L)  Chloride 98 - 111 mmol/L 99 102 100  CO2 22 - 32 mmol/L 27 27 27   Calcium 8.9 - 10.3 mg/dL 8.7(L) 8.9 8.7(L)  Total Protein 6.5 - 8.1 g/dL - - 5.6(L)  Total Bilirubin 0.3 - 1.2 mg/dL - - 1.2  Alkaline Phos 38 - 126 U/L - - 66  AST 15 - 41 U/L - - 21  ALT 0 - 44 U/L - - 25  Pending Labs: none  Imaging: No results found.  Assessment/Plan:   Principal Problem:   Frequent falls Active Problems:   Lung mass   Paresthesias    has a past medical history of A-fib ([place]), Hemorrhoids, Hyperlipidemia, Hypertension, Lung cancer ([place]) (2000), Obesity, Persistent atrial fibrillation ([place]), and Rheumatoid arthritis ([place]).  Patient Summary: Leah Velasquez is a 79 y.o. with a pertinent PMH of  chronic afib onCoumadin, HTN, HFpEF, RA, remote lung cancer s/p resection , who presented with progressive lower extremity weakness with recurrent falls and numbnessin herfeet and admitted for further evaluation and management.   She is on hospital day 4 and doing well.   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 Patient denies new symptoms at this time. INR was 1.3 today.   - EBUS today per pulmonology, appreciate their assistance. - Continue PT/OT, will need SNF for subacute PT - Patient will need an outpatient PCP to help coordinate care at discharge.   Progressive Lower Extremity Weakness   Sensory Neuropathy Continue to have LE paresthesias. She may benefit form symptomatic relief with Gabapentin in the future.   - Continue B12 supplementation.  - PT/OT  - Continue to hold leflunomide  - TOC for SNF  HTN - Hold losartan today for low blood pressures.  - Will likely need to continue this medication tomorrow depending on her pressures.   Diet: NPO a IVF: None,None VTE: SCDs (holding Warfarin) Code: Full PT/OT recs: SNF for subacute PT TOC recs: [facility] will be ready for D/C tomorrow.    Leah Velasquez, D.O. [facility]IMTP, PGY-1 Date 02/18/2020 Time 9:46 AM Pager: [phone] Please contact the on call pager after 5 pm and on weekends at [phone].

## 2020-02-18 NOTE — Anesthesia Postprocedure Evaluation (Signed)
Anesthesia Post Note  Patient: Leah Velasquez  Procedure(s) Performed: VIDEO BRONCHOSCOPY WITH FLUORO (N/A ) ELECTROMAGNETIC NAVIGATION BRONCHOSCOPY (N/A ) BRONCHIAL BRUSHINGS BRONCHIAL WASHINGS BRONCHIAL NEEDLE ASPIRATION BIOPSIES BRONCHIAL BIOPSIES     Patient location during evaluation: PACU Anesthesia Type: General Level of consciousness: awake and alert, patient cooperative and oriented Pain management: pain level controlled Vital Signs Assessment: post-procedure vital signs reviewed and stable Respiratory status: spontaneous breathing, nonlabored ventilation and respiratory function stable Cardiovascular status: blood pressure returned to baseline and stable Postop Assessment: no apparent nausea or vomiting Anesthetic complications: no   No complications documented.  Last Vitals:  Vitals:   02/18/20 1413 02/18/20 1434  BP: 135/74 (!) 141/65  Pulse: 76 79  Resp: 17 (!) 21  Temp: 36.6 C   SpO2: 100% 95%    Last Pain:  Vitals:   02/18/20 1434  TempSrc:   PainSc: 0-No pain                 Korry Dalgleish,E. Alfonse Garringer

## 2020-02-18 NOTE — Anesthesia Preprocedure Evaluation (Addendum)
Anesthesia Evaluation  Patient identified by MRN, date of birth, ID band Patient awake    Reviewed: Allergy & Precautions, NPO status , Patient's Chart, lab work & pertinent test results, reviewed documented beta blocker date and time   Airway Mallampati: IV  TM Distance: <3 FB Neck ROM: Full    Dental  (+) Teeth Intact, Dental Advisory Given, Caps,    Pulmonary  pulmonary nodule RLL cancer   Pulmonary exam normal breath sounds clear to auscultation       Cardiovascular hypertension, Pt. on home beta blockers and Pt. on medications +CHF  + dysrhythmias Atrial Fibrillation  Rhythm:Regular Rate:Normal     Neuro/Psych negative neurological ROS  negative psych ROS   GI/Hepatic Neg liver ROS, GERD  Medicated,  Endo/Other  Morbid obesity  Renal/GU negative Renal ROS     Musculoskeletal  (+) Arthritis , Rheumatoid disorders,    Abdominal   Peds  Hematology  (+) Blood dyscrasia (Coumadin), ,   Anesthesia Other Findings Day of surgery medications reviewed with the patient.  Reproductive/Obstetrics                            Anesthesia Physical Anesthesia Plan  ASA: III  Anesthesia Plan: General   Post-op Pain Management:    Induction: Intravenous  PONV Risk Score and Plan: 3 and Ondansetron, Dexamethasone and Treatment may vary due to age or medical condition  Airway Management Planned: Oral ETT and Video Laryngoscope Planned  Additional Equipment:   Intra-op Plan:   Post-operative Plan: Extubation in OR  Informed Consent: I have reviewed the patients History and Physical, chart, labs and discussed the procedure including the risks, benefits and alternatives for the proposed anesthesia with the patient or authorized representative who has indicated his/her understanding and acceptance.     Dental advisory given  Plan Discussed with: CRNA  Anesthesia Plan Comments:         Anesthesia Quick Evaluation

## 2020-02-18 NOTE — Transfer of Care (Signed)
Immediate Anesthesia Transfer of Care Note  Patient: Leah Velasquez  Procedure(s) Performed: VIDEO BRONCHOSCOPY WITH FLUORO (N/A ) ELECTROMAGNETIC NAVIGATION BRONCHOSCOPY (N/A ) BRONCHIAL BRUSHINGS BRONCHIAL WASHINGS BRONCHIAL NEEDLE ASPIRATION BIOPSIES BRONCHIAL BIOPSIES  Patient Location: Endoscopy Unit  Anesthesia Type:General  Level of Consciousness: awake, alert  and oriented  Airway & Oxygen Therapy: Patient Spontanous Breathing and Patient connected to face mask oxygen  Post-op Assessment: Report given to RN, Post -op Vital signs reviewed and stable and Patient moving all extremities X 4  Post vital signs: Reviewed and stable  Last Vitals:  Vitals Value Taken Time  BP 135/74 02/18/20 1413  Temp    Pulse 75 02/18/20 1413  Resp 14 02/18/20 1413  SpO2 98 % 02/18/20 1413  Vitals shown include unvalidated device data.  Last Pain:  Vitals:   02/18/20 0927  TempSrc:   PainSc: Asleep      Patients Stated Pain Goal: 0 (62/83/66 2947)  Complications: No complications documented.

## 2020-02-19 ENCOUNTER — Encounter: Payer: Self-pay | Admitting: *Deleted

## 2020-02-19 ENCOUNTER — Encounter (HOSPITAL_COMMUNITY): Payer: Self-pay | Admitting: Emergency Medicine

## 2020-02-19 DIAGNOSIS — R918 Other nonspecific abnormal finding of lung field: Secondary | ICD-10-CM

## 2020-02-19 LAB — PROTIME-INR
INR: 1.2 (ref 0.8–1.2)
Prothrombin Time: 14.6 seconds (ref 11.4–15.2)

## 2020-02-19 LAB — COMPREHENSIVE METABOLIC PANEL
ALT: 38 U/L (ref 0–44)
AST: 31 U/L (ref 15–41)
Albumin: 2.7 g/dL — ABNORMAL LOW (ref 3.5–5.0)
Alkaline Phosphatase: 68 U/L (ref 38–126)
Anion gap: 10 (ref 5–15)
BUN: 17 mg/dL (ref 8–23)
CO2: 25 mmol/L (ref 22–32)
Calcium: 8.9 mg/dL (ref 8.9–10.3)
Chloride: 101 mmol/L (ref 98–111)
Creatinine, Ser: 0.79 mg/dL (ref 0.44–1.00)
GFR calc Af Amer: >60 mL/min (ref >60)
GFR calc non Af Amer: >60 mL/min (ref >60)
Glucose, Bld: 140 mg/dL — ABNORMAL HIGH (ref 70–99)
Potassium: 4.3 mmol/L (ref 3.5–5.1)
Sodium: 136 mmol/L (ref 135–145)
Total Bilirubin: 0.7 mg/dL (ref 0.3–1.2)
Total Protein: 5.5 g/dL — ABNORMAL LOW (ref 6.5–8.1)

## 2020-02-19 LAB — CBC
HCT: 40.5 % (ref 36.0–46.0)
Hemoglobin: 13 g/dL (ref 12.0–15.0)
MCH: 30.9 pg (ref 26.0–34.0)
MCHC: 32.1 g/dL (ref 30.0–36.0)
MCV: 96.2 fL (ref 80.0–100.0)
Platelets: 151 10*3/uL (ref 150–400)
RBC: 4.21 MIL/uL (ref 3.87–5.11)
RDW: 14.1 % (ref 11.5–15.5)
WBC: 9.2 10*3/uL (ref 4.0–10.5)
nRBC: 0 % (ref 0.0–0.2)

## 2020-02-19 LAB — SURGICAL PATHOLOGY

## 2020-02-19 MED ORDER — LOSARTAN POTASSIUM 25 MG PO TABS
25.0000 mg | ORAL_TABLET | Freq: Every day | ORAL | Status: DC
  Administered 2020-02-19 – 2020-02-20 (×2): 25 mg via ORAL
  Filled 2020-02-19 (×2): qty 1

## 2020-02-19 MED ORDER — LOSARTAN POTASSIUM 50 MG PO TABS: 50.0000 mg | ORAL_TABLET | Freq: Every day | ORAL | Status: DC

## 2020-02-19 MED ORDER — WARFARIN - PHARMACIST DOSING INPATIENT: Freq: Every day | Status: DC

## 2020-02-19 MED ORDER — WARFARIN SODIUM 6 MG PO TABS
6.0000 mg | ORAL_TABLET | Freq: Once | ORAL | Status: AC
  Administered 2020-02-19: 6 mg via ORAL
  Filled 2020-02-19: qty 1

## 2020-02-19 NOTE — Progress Notes (Signed)
ANTICOAGULATION CONSULT NOTE - Initial Consult  Pharmacy Consult for Coumadin Indication: atrial fibrillation  Allergies  Allergen Reactions  . Morphine Anaphylaxis  . Morphine And Related Other (See Comments)    "vitals go down"  . Morphine And Related     Pass out   . Sulfa Antibiotics Rash  . Sulfa Antibiotics Rash  . Sulfa Antibiotics Rash    Patient Measurements: Height: 5\' 1"  (154.9 cm) Weight: 102.5 kg (226 lb 0.1 oz) IBW/kg (Calculated) : 47.8  Vital Signs: Temp: 98.3 F (36.8 C) (06/30 0506) Temp Source: Oral (06/29 2043) BP: 132/85 (06/30 0506) Pulse Rate: 84 (06/30 0506)  Labs: Recent Labs    02/17/20 0455 02/17/20 0455 02/18/20 0421 02/18/20 0515 02/19/20 0432  HGB 13.8   < >  --  13.4 13.0  HCT 42.2  --   --  41.8 40.5  PLT 144*  --   --  142* 151  LABPROT 17.4*  --  15.7*  --  14.6  INR 1.5*  --  1.3*  --  1.2  CREATININE 0.86  --   --  0.87 0.79   < > = values in this interval not displayed.    Estimated Creatinine Clearance: 64.8 mL/min (by C-G formula based on SCr of 0.79 mg/dL).   Medical History: Past Medical History:  Diagnosis Date  . A-fib ([place])   . Hemorrhoids   . Hyperlipidemia   . Hypertension   . Lung cancer ([place]) 2000   RLL  . Obesity   . Persistent atrial fibrillation ([place])   . Rheumatoid arthritis ([facility])     Medications:  Medications Prior to Admission  Medication Sig Dispense Refill Last Dose  . albuterol (PROVENTIL HFA;VENTOLIN HFA) 108 (90 Base) MCG/ACT inhaler Inhale 2 puffs into the lungs every 6 (six) hours as needed for wheezing or shortness of breath. 1 Inhaler 0 unknown  . albuterol (VENTOLIN HFA) 108 (90 Base) MCG/ACT inhaler Inhale 1-2 puffs into the lungs every 6 (six) hours as needed for wheezing or shortness of breath.   unknown  . calcium carbonate (TUMS - DOSED IN MG ELEMENTAL CALCIUM) 500 MG chewable tablet Chew 1 tablet (200 mg of elemental calcium total) by mouth 3 (three) times daily as needed for  indigestion or heartburn.   02/13/2020 at Unknown time  . Cholecalciferol (VITAMIN D3) 1000 units CAPS Take 1,000 Units by mouth daily.   02/13/2020 at Unknown time  . conjugated estrogens (PREMARIN) vaginal cream Place 1 Applicatorful vaginally 2 (two) times a week.   02/13/2020 at Unknown time  . Esomeprazole Magnesium (NEXIUM PO) Take 1 capsule by mouth daily as needed (Indigestion).   unknonw  . fluticasone (FLONASE) 50 MCG/ACT nasal spray Place 1 spray into both nostrils daily.    unknown  . furosemide (LASIX) 40 MG tablet Take 1 tablet (40 mg total) by mouth daily. (Patient taking differently: Take 20 mg by mouth daily. ) 90 tablet 1 Past Week at Unknown time  . leflunomide (ARAVA) 20 MG tablet Take 20 mg by mouth daily.   02/13/2020 at Unknown time  . levocetirizine (XYZAL) 5 MG tablet Take 5 mg by mouth every evening.    02/13/2020 at Unknown time  . lidocaine (LIDODERM) 5 % Place 1 patch onto the skin daily. Remove & Discard patch within 12 hours or as directed by MD 30 patch 0 02/14/2020 at Unknown time  . losartan (COZAAR) 50 MG tablet TAKE 1 TABLET DAILY. PLEASE CALL TO SCHEDULE APPOINTMENT FOR FURTHER  REFILLS. (Patient taking differently: Take 50 mg by mouth daily. ) 30 tablet 0 02/13/2020 at Unknown time  . metoprolol succinate (TOPROL XL) 25 MG 24 hr tablet Take 0.5 tablets (12.5 mg total) by mouth at bedtime. 45 tablet 3 02/13/2020 at 2200  . simvastatin (ZOCOR) 20 MG tablet Take 20 mg by mouth at bedtime.   02/13/2020 at Unknown time  . timolol (TIMOPTIC) 0.5 % ophthalmic solution Place 1 drop into both eyes in the morning and at bedtime.   02/13/2020 at Unknown time  . traMADol (ULTRAM) 50 MG tablet Take 50 mg by mouth daily as needed for moderate pain.   0 02/13/2020 at Unknown time  . warfarin (COUMADIN) 5 MG tablet Take 2.5-5 mg by mouth See admin instructions. Take 2.5mg  Sunday, Monday, Wednesday and Friday then take 5mg  all the other days   02/13/2020 at 1200  . zolpidem (AMBIEN) 5 MG  tablet Take 2.5 mg by mouth at bedtime.   02/13/2020 at Unknown time  . hydrocortisone (ANUSOL-HC) 2.5 % rectal cream Apply rectally 2 times daily (Patient not taking: Reported on 02/14/2020) 28.35 g 1 Not Taking at Unknown time  . potassium chloride SA (KLOR-CON M20) 20 MEQ tablet TAKE 1 TABLET BY MOUTH EVERY DAY (Patient not taking: Reported on 02/14/2020) 90 tablet 0 Not Taking at Unknown time  . warfarin (COUMADIN) 5 MG tablet Take 1/2 to 1 tablet by mouth daily as directed by coumadin clinic (Patient not taking: Reported on 02/14/2020) 90 tablet 0 Not Taking at Unknown time   Scheduled:  . cholecalciferol  1,000 Units Oral Daily  . conjugated estrogens  1 Applicatorful Vaginal Once per day on Mon Thu  . furosemide  20 mg Oral Daily  . metoprolol succinate  12.5 mg Oral Daily  . pantoprazole  40 mg Oral Daily  . simvastatin  20 mg Oral QHS  . timolol  1 drop Both Eyes BID  . vitamin B-12  1,000 mcg Oral Daily   Infusions:  . lactated ringers      Assessment: 79yo female to resume Coumadin for Afib after being held for biopsy, completed yesterday.  Goal of Therapy:  INR 2-3   Plan:  Will give boosted Coumadin dose of 6mg  PO x1 today and monitor INR for dose adjustments; resume home regimen 2.5mg  MWFSu and 5mg  TTSa on discharge.  Wynona Neat, PharmD, BCPS  02/19/2020,7:18 AM

## 2020-02-19 NOTE — Progress Notes (Signed)
PCCM Interval Note  Cytology and pathology from right upper lobe biopsies still pending.  Initial cytology was reported as adequate, likely malignancy, final report pending.  Based on this may be reasonable to go ahead and call for an oncology consultation so that discussions can be initiated while the patient's daughters are still in town from [place].  I will continue to follow for bronchoscopy results.  Baltazar Apo, MD, PhD 02/19/2020, 9:55 AM [facility] [phone] or if no answer [phone]

## 2020-02-19 NOTE — Progress Notes (Signed)
I received referral on Ms. Hobby today.  Appt placed.

## 2020-02-19 NOTE — Progress Notes (Signed)
Occupational Therapy Treatment Patient Details Name: Leah Velasquez MRN: [id number] DOB: 01-21-1941 Today's Date: 02/19/2020    History of present illness Patient is a 79 y/o female presenting to the ED on 02/14/20 due to LE weakness, numbness and falls. PMH of chronic afib on Coumadin, HTN, HFpEF, remote history of lung cancer s/p resection. CT chest showed right apical and suprahilar masses consistent with malignancy with extension into the hilum. Patient admitted for further work up.    OT comments  Pt progressing toward OT goals. Pt resistant to participation initially but was amendable to EOB only ADLs. Pt required increased assist for sitting balance this session with frequent L lean and LOB. Pt able to complete UB ADLs with min A overall, as well as grooming tasks with set up assist. Pt declined LB bathing, stating nursing staff had completed it prior. SNF d/c remains appropriate. Continue current OT POC.    Follow Up Recommendations  SNF;Supervision/Assistance - 24 hour    Equipment Recommendations  Other (comment) (Decline to next venue)       Precautions / Restrictions Precautions Precautions: Fall Restrictions Weight Bearing Restrictions: No       Mobility Bed Mobility Overal bed mobility: Needs Assistance Bed Mobility: Supine to Sit;Sit to Supine     Supine to sit: Mod assist Sit to supine: Mod assist   General bed mobility comments: Pt required cueing for technique, heavy assist on the chuck pad, and assist for trunk lifting   Transfers                 General transfer comment: Pt declined any OOB transfer    Balance Overall balance assessment: Needs assistance Sitting-balance support: Feet supported Sitting balance-Leahy Scale: Poor Sitting balance - Comments: Frequent LOB to the L while sitting EOB Postural control: Left lateral lean;Posterior lean Standing balance support: Bilateral upper extremity supported                                ADL either performed or assessed with clinical judgement   ADL Overall ADL's : Needs assistance/impaired Eating/Feeding: Set up;Bed level   Grooming: Set up;Sitting   Upper Body Bathing: Minimal assistance;Sitting;Cueing for safety     Lower Body Bathing Details (indicate cue type and reason): Declined Upper Body Dressing : Minimal assistance;Sitting     Lower Body Dressing Details (indicate cue type and reason): Declined               General ADL Comments: ADLs performed EOB            Cognition Arousal/Alertness: Awake/alert Behavior During Therapy: WFL for tasks assessed/performed Overall Cognitive Status: Within Functional Limits for tasks assessed                                 General Comments: Slow processing and poor awareness              General Comments Pt resistant to OT intervention initially but was then willing to come EOB. No further c/o pain and alert throughout session.     Pertinent Vitals/ Pain       Pain Assessment: 0-10 Pain Score: 7  Pain Location: achy in knees and ankles Pain Descriptors / Indicators: Aching;Guarding Pain Intervention(s): Limited activity within patient's tolerance;Repositioned     Prior Functioning/Environment  Frequency  Min 2X/week        Progress Toward Goals  OT Goals(current goals can now be found in the care plan section)  Progress towards OT goals: Progressing toward goals  Acute Rehab OT Goals Patient Stated Goal: Get R knee feeling better OT Goal Formulation: With patient Time For Goal Achievement: 03/02/20 Potential to Achieve Goals: Good  Plan Discharge plan remains appropriate;Frequency remains appropriate    AM-PAC OT "6 Clicks" Daily Activity     Outcome Measure   Help from another person eating meals?: None Help from another person taking care of personal grooming?: A Little Help from another person toileting, which includes using toliet, bedpan, or  urinal?: A Lot Help from another person bathing (including washing, rinsing, drying)?: A Lot Help from another person to put on and taking off regular upper body clothing?: A Little Help from another person to put on and taking off regular lower body clothing?: A Lot 6 Click Score: 16    End of Session    OT Visit Diagnosis: Unsteadiness on feet (R26.81);History of falling (Z91.81);Repeated falls (R29.6);Pain Pain - Right/Left: Right Pain - part of body: Knee   Activity Tolerance Patient tolerated treatment well   Patient Left in bed;with call bell/phone within reach   Nurse Communication Mobility status        Time: 1133-1207 OT Time Calculation (min): 34 min  Charges: OT General Charges $OT Visit: 1 Visit OT Treatments $Self Care/Home Management : 23-37 mins   Curtis Sites OTR/L  02/19/2020, 12:40 PM

## 2020-02-19 NOTE — Plan of Care (Signed)
  Problem: Safety: Goal: Ability to remain free from injury will improve Outcome: Progressing   

## 2020-02-19 NOTE — Progress Notes (Signed)
HD#5 Subjective:  Overnight Events: none   Patient is resting comfortable in bed states that the procedure went well yesterday and she is ready to find out the results.   Objective:  Vital signs in last 24 hours: Vitals:   02/18/20 1454 02/18/20 1628 02/18/20 2043 02/19/20 0506  BP: (!) 150/71 123/76 128/73 132/85  Pulse: 78 76 79 84  Resp: 16 18 16 16   Temp:  97.6 F (36.4 C) 97.7 F (36.5 C) 98.3 F (36.8 C)  TempSrc:  Oral Oral   SpO2: 93% 93% 92% 92%  Weight:   102.5 kg   Height:       Supplemental O2: Room Air SpO2: 92 % O2 Flow Rate (L/min): 8 L/min   Physical Exam:  Physical Exam Constitutional:      Appearance: Normal appearance.  HENT:     Head: Normocephalic and atraumatic.  Eyes:     Extraocular Movements: Extraocular movements intact.  Cardiovascular:     Rate and Rhythm: Normal rate.     Pulses: Normal pulses.     Heart sounds: Normal heart sounds.  Pulmonary:     Effort: Pulmonary effort is normal.     Breath sounds: Normal breath sounds.  Abdominal:     General: Bowel sounds are normal.     Palpations: Abdomen is soft.     Tenderness: There is no abdominal tenderness.  Musculoskeletal:        General: Tenderness (ankles bilateral) present. No swelling. Normal range of motion.     Cervical back: Normal range of motion.     Right lower leg: No edema.     Left lower leg: No edema.  Skin:    General: Skin is warm and dry.  Neurological:     Mental Status: She is alert and oriented to person, place, and time. Mental status is at baseline.     Comments: Bilateral LE paresthesias   Psychiatric:        Mood and Affect: Mood normal.     Filed Weights   02/16/20 2034 02/17/20 1900 02/18/20 2043  Weight: 102.1 kg 102.5 kg 102.5 kg     Intake/Output Summary (Last 24 hours) at 02/19/2020 0716 Last data filed at 02/19/2020 0533 Gross per 24 hour  Intake 1335.75 ml  Output 55 ml  Net 1280.75 ml   Net IO Since Admission: -344.25 mL  [02/19/20 0716]  Pertinent Labs: CBC Latest Ref Rng & Units 02/19/2020 02/18/2020 02/17/2020  WBC 4.0 - 10.5 K/uL 9.2 5.8 6.3  Hemoglobin 12.0 - 15.0 g/dL 13.0 13.4 13.8  Hematocrit 36 - 46 % 40.5 41.8 42.2  Platelets 150 - 400 K/uL 151 142(L) 144(L)    CMP Latest Ref Rng & Units 02/19/2020 02/18/2020 02/17/2020  Glucose 70 - 99 mg/dL 140(H) 114(H) 104(H)  BUN 8 - 23 mg/dL 17 14 15   Creatinine 0.44 - 1.00 mg/dL 0.79 0.87 0.86  Sodium 135 - 145 mmol/L 136 136 138  Potassium 3.5 - 5.1 mmol/L 4.3 3.1(L) 3.2(L)  Chloride 98 - 111 mmol/L 101 99 102  CO2 22 - 32 mmol/L 25 27 27   Calcium 8.9 - 10.3 mg/dL 8.9 8.7(L) 8.9  Total Protein 6.5 - 8.1 g/dL 5.5(L) - -  Total Bilirubin 0.3 - 1.2 mg/dL 0.7 - -  Alkaline Phos 38 - 126 U/L 68 - -  AST 15 - 41 U/L 31 - -  ALT 0 - 44 U/L 38 - -    Pending Labs: lung pathology pending  Imaging: DG Chest Port 1 View  Result Date: 02/18/2020 CLINICAL DATA:  Status post bronchoscopy EXAM: PORTABLE CHEST 1 VIEW COMPARISON:  Chest CT February 14, 2020 FINDINGS: No pneumothorax. There is postoperative change on the right with volume loss. The pulmonary nodular opacities seen along the medial right apex and right medial suprahilar regions are less evident than on recent CT. Ill-defined opacities in these areas is present. The left lung is clear. Heart size and pulmonary vascular normal. No adenopathy. Adenopathy evident on recent CT is not well appreciated by radiography. IMPRESSION: No pneumothorax. Ill-defined opacity right apex and right suprahilar regions medially present but better delineated on recent CT. Volume loss of postoperative change noted on the right. No new opacity evident. Left lung clear. Cardiac silhouette normal. Adenopathy seen recent CT not well seen by portable radiography examination. Electronically Signed   By: Lowella Grip III M.D.   On: 02/18/2020 15:07   DG C-ARM BRONCHOSCOPY  Result Date: 02/18/2020 C-ARM BRONCHOSCOPY: Fluoroscopy was  utilized by the requesting physician.  No radiographic interpretation.    Assessment/Plan:   Principal Problem:   Lung mass Active Problems:   Frequent falls   Paresthesias    Patient Summary: Leah Gurr Smytheis a 79 y.o.with a pertinent PMH of chronic afib onCoumadin, HTN, HFpEF, RA, remote lung cancer s/p resection, who presented withprogressive lower extremity weakness with recurrent falls and numbnessin herfeetand admitted for further evaluation and management.  She is on hospital day 5 and doing well.   Right Apical and Right Suprahilar Lung Mass  Satellite Lung Nodules Patient tolerated bronchoscopy well. Preliminary results consistent with malignancy. I call oncology and set up outpatient follow up. Otherwise, she is medically cleared for d/c to SNF.  - Continue PT/OT, will need SNF for subacute PT  Progressive Lower Extremity Weakness  Sensory Neuropathy  - Continue B12 supplementation. - Continue PT/OT  - Continue to hold leflunomide  - TOC for SNF - Would benefit form outpatient referral.   HTN - Restart losartan today at 25 mg. She will need titration in the OP setting.  Chronic A Fib: - Will restart Warfarin today. - Will call her clinic to set up f/u for IN recheck.   Diet:NPO a BEE:FEOF,HQRF XJO:ITGP (holding Warfarin) Code:Full PT/OT recs:SNF for subacute PT TOC recs:[facility] will be ready for D/C tomorrow.    Marianna Payment, D.O. [facility], PGY-1 Date 02/19/2020 Time 7:16 AM Pager: [phone] Please contact the on call pager after 5 pm and on weekends at [phone].

## 2020-02-19 NOTE — TOC Progression Note (Signed)
Transition of Care [facility] - Progression Note    Patient Details  Name: Leah Velasquez MRN: [id number] Date of Birth: 27-Oct-1940  Transition of Care [facility]) CM/SW Contact  Sharlet Salina Mila Homer, LCSW Phone Number: 02/19/2020, 5:48 PM  Clinical Narrative: Daughters visited [facility] today and decided on this facility for their mother for [place] rehab. Freda Munro, admissions director at [facility] contacted and indicated that she will need to know about cancer treatment transportation. CSW reviewed notes this afternoon and contacted Freda Munro (VM left) regarding patient ready for discharge and oncology appointment has been made for post-discharge. CSW will f/u with Freda Munro on 7/1 regarding patient and CA treatment transportation.   Call made to [employer] and patient is managed by them. CSW given reference number [id number] and clinicals faxed to [place]. CSW will continue to follow and provide SW interventions services through discharge.     Expected Discharge Plan: [place] Barriers to Discharge: Continued Medical Work up  Expected Discharge Plan and Services Expected Discharge Plan: [place] In-house Referral: Clinical Social Work     Living arrangements for the past 2 months: Single Family Home                                       Social Determinants of Health (SDOH) Interventions    Readmission Risk Interventions No flowsheet data found.

## 2020-02-19 NOTE — Plan of Care (Signed)
  Problem: Activity: Goal: Risk for activity intolerance will decrease Outcome: Progressing   Problem: Nutrition: Goal: Adequate nutrition will be maintained Outcome: Progressing   Problem: Coping: Goal: Level of anxiety will decrease Outcome: Progressing   

## 2020-02-20 ENCOUNTER — Inpatient Hospital Stay (HOSPITAL_COMMUNITY): Payer: Medicare HMO

## 2020-02-20 ENCOUNTER — Telehealth: Payer: Self-pay | Admitting: *Deleted

## 2020-02-20 DIAGNOSIS — R918 Other nonspecific abnormal finding of lung field: Secondary | ICD-10-CM

## 2020-02-20 LAB — SARS CORONAVIRUS 2 (TAT 6-24 HRS): SARS Coronavirus 2: NEGATIVE

## 2020-02-20 LAB — PROTIME-INR
INR: 1.2 (ref 0.8–1.2)
Prothrombin Time: 14.7 seconds (ref 11.4–15.2)

## 2020-02-20 IMAGING — DX DG CHEST 1V PORT
1 series · 1 of 1 positions shown · non-contrast
Comparison: [DATE]

CLINICAL DATA: Wheezing

EXAM:
PORTABLE CHEST 1 VIEW

[chest ap]
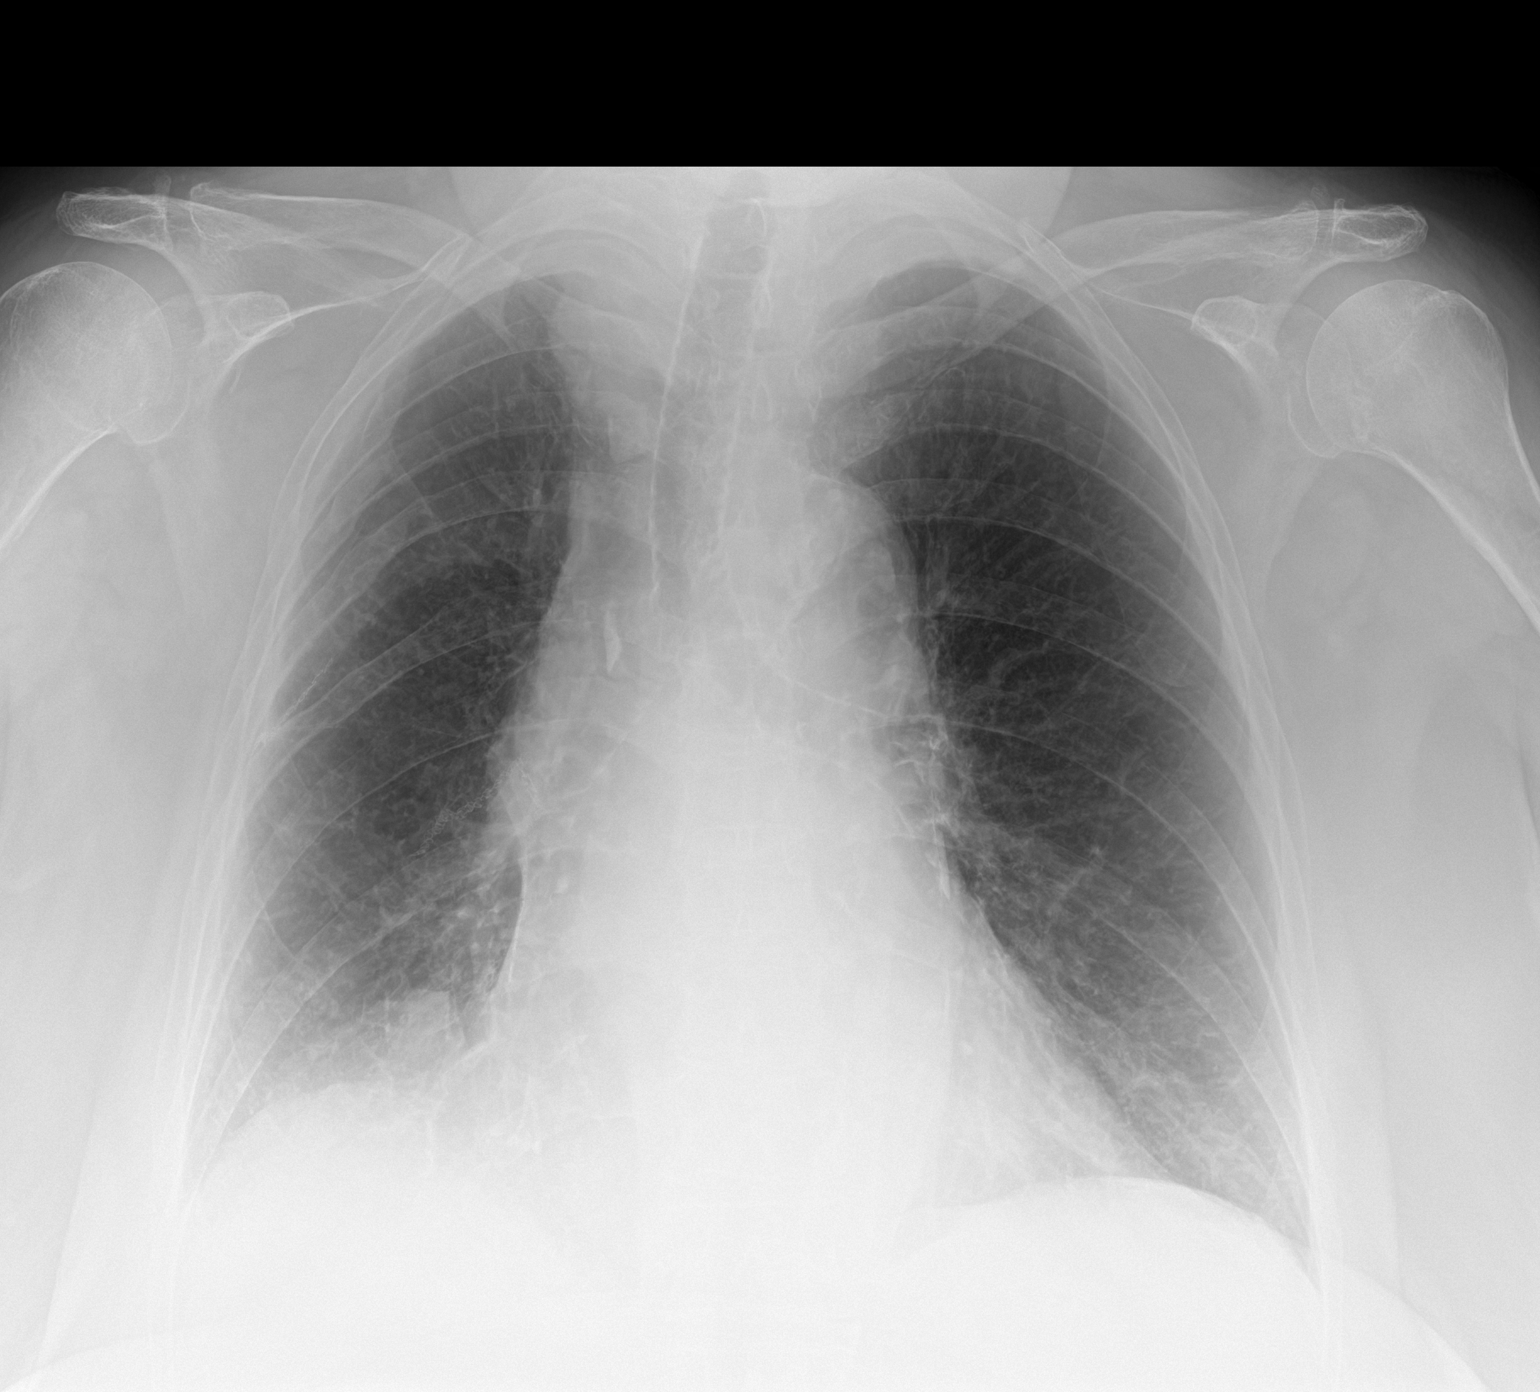

[1 of 1 positions shown; findings below may reference images not displayed]

FINDINGS: Improved aeration at the right base. Continued mild atelectasis or
infiltrate. Left lung clear. Heart is borderline in size. Aortic
atherosclerosis.
IMPRESSION: Improving right basilar opacity with mild residual right base
atelectasis or infiltrate.

## 2020-02-20 MED ORDER — CYANOCOBALAMIN 1000 MCG PO TABS: 1000.0000 ug | ORAL_TABLET | Freq: Every day | ORAL | 0 refills | Status: AC

## 2020-02-20 MED ORDER — WARFARIN SODIUM 6 MG PO TABS
6.0000 mg | ORAL_TABLET | Freq: Once | ORAL | Status: AC
  Administered 2020-02-20: 6 mg via ORAL
  Filled 2020-02-20: qty 1

## 2020-02-20 NOTE — Progress Notes (Signed)
NAME:  Leah Velasquez, MRN:  [id number], DOB:  [birth year], LOS: 6 ADMISSION DATE:  02/14/2020, CONSULTATION DATE:  02/15/20 REFERRING MD:  Gilles Chiquito, CHIEF COMPLAINT:  RUL lung mass   Brief History   RUL lung mass. Hx of RA on arava, prednisone and A Fib on Coumadin. 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  She is status post bilateral knee replacement with the right one in 2011 and the left one in 2014.  For the last 4 to 5 years she has been on chronic prednisonebecause of bew dx of  rheumatoid arthritis [follows up with Dr. Levada Dy Hawkes] with a new diagnosis of rheumatoid arthritis around this time.  In 2019 apparently was admitted for pneumonia and influenza and atrial fibrillation and was hospitalized for over a week.  After that has been using a walker and also has been on warfarin.  She also has been on on methotrexate for her rheumatoid arthritis but for the last 1-2 years has been on [place].  In the last few weeks she has had increased bilateral ankle pain which is caused some unsteadiness in her feet.  She was given Solu-Medrol.  Then just prior to admission she thinks he slipped and fell in her carpet.  Did not lose any consciousness.  Because of this she was admitted.  MRI of the thoracic and lumbar spine has been noncontributory but showed a right upper lobe mass.  This was followed up with a CT scan of the chest  1. Right apical subpleural and right suprahilar masses consistent with malignancy. There is extension of this mass into the right hilum. 2. Right mediastinal soft tissue mass or abnormal lymph node with extension to the right hilum. Bronchoscopy may provide better evaluation. 3. A cluster of nodular density in the right upper lobe  extending to the right lateral pleural surface and along the right minor fissure most consistent with satellite nodules. 4. Aortic Atherosclerosis (ICD10-I70.0) and Emphysema (I  On coumadni at home Not on aspirinl or plavix  Interim history/subjective:   [name] is going to SNF / rehab today I'm still waiting for her EBUS cytology. The endobronchial bx's were negative  Objective   Blood pressure (!) 133/108, pulse 60, temperature 97.7 F (36.5 C), temperature source Oral, resp. rate 20, height 5\' 1"  (1.549 m), weight 102.5 kg, SpO2 95 %.        Intake/Output Summary (Last 24 hours) at 02/20/2020 1544 Last data filed at 02/20/2020 1322 Gross per 24 hour  Intake 540 ml  Output 1501 ml  Net -961 ml   Filed Weights   02/16/20 2034 02/17/20 1900 02/18/20 2043  Weight: 102.1 kg 102.5 kg 102.5 kg    Examination: General: Obese woman, in bed, comfortable, cushingoid facies HENT: M3 airway, no lymphadenopathy, oropharynx clear, strong voice Lungs: Decreased at both bases, clear bilaterally Cardiovascular: Irregularly irregular, no murmur Abdomen: Nondistended, positive bowel sounds Extremities: No edema Neuro: Awake, alert, interacting appropriately, moves all extremities, follows commands  Resolved Hospital Problem list   X  Assessment & Plan:   Right upper lobe pulmonary nodules with enlarged right hilar lymph node.   Presumed primary lung cancer. Preliminary data from EBUS consistent with malignancy, but await the final  path.   She is going to SNF rehab today, so I will have to call her with the cytology results. Explained this to her. She has OV scheduled with Dr Julien Nordmann 02/27/20.      SIGNATURE   Baltazar Apo, MD, PhD 02/20/2020, 3:44 PM  Pulmonary and Critical Care [phone] or if no answer [phone]    LABS    PULMONARY No results for input(s): PHART, PCO2ART, PO2ART, HCO3, TCO2, O2SAT in the last 168 hours.  Invalid input(s): PCO2,  PO2  CBC Recent Labs  Lab 02/17/20 0455 02/18/20 0515 02/19/20 0432  HGB 13.8 13.4 13.0  HCT 42.2 41.8 40.5  WBC 6.3 5.8 9.2  PLT 144* 142* 151    COAGULATION Recent Labs  Lab 02/16/20 0431 02/17/20 0455 02/18/20 0421 02/19/20 0432 02/20/20 0406  INR 1.9* 1.5* 1.3* 1.2 1.2    CARDIAC  No results for input(s): TROPONINI in the last 168 hours. No results for input(s): PROBNP in the last 168 hours.   CHEMISTRY Recent Labs  Lab 02/15/20 0121 02/15/20 0121 02/16/20 0431 02/16/20 0431 02/17/20 0455 02/17/20 0455 02/18/20 0515 02/19/20 0432  NA 138  --  138  --  138  --  136 136  K 3.5   < > 3.1*   < > 3.2*   < > 3.1* 4.3  CL 102  --  100  --  102  --  99 101  CO2 23  --  27  --  27  --  27 25  GLUCOSE 99  --  111*  --  104*  --  114* 140*  BUN 12  --  13  --  15  --  14 17  CREATININE 0.82  --  0.91  --  0.86  --  0.87 0.79  CALCIUM 9.0  --  8.7*  --  8.9  --  8.7* 8.9  MG 1.9  --   --   --   --   --   --   --   PHOS 3.0  --   --   --   --   --   --   --    < > = values in this interval not displayed.   Estimated Creatinine Clearance: 64.8 mL/min (by C-G formula based on SCr of 0.79 mg/dL).   LIVER Recent Labs  Lab 02/14/20 1644 02/14/20 1857 02/16/20 0431 02/17/20 0455 02/18/20 0421 02/19/20 0432 02/20/20 0406  AST 29  --  21  --   --  31  --   ALT 25  --  25  --   --  38  --   ALKPHOS 62  --  66  --   --  68  --   BILITOT 1.2  --  1.2  --   --  0.7  --   PROT 5.7*  --  5.6*  --   --  5.5*  --   ALBUMIN 3.2*  --  2.8*  --   --  2.7*  --   INR  --    < > 1.9* 1.5* 1.3* 1.2 1.2   < > = values in this interval not displayed.

## 2020-02-20 NOTE — Progress Notes (Signed)
Patient's daughter called and said pt is having expiratory wheezing and insisted to have chest x-ray before transported to Rehab, checked the pt, made MD aware, gave albuterol treatment.  MD ordered stat x-ray and as per MD as long as the x-ray is done and pt's saturation is above 92% pt can discharged to Rehab.  Will continue to monitor.

## 2020-02-20 NOTE — Telephone Encounter (Signed)
I received referral from Dr. Lindi Adie.  I called patient and scheduled her to be seen with Dr. Julien Nordmann at [facility].  She verbalized understanding of appt time and place.

## 2020-02-20 NOTE — Progress Notes (Signed)
Physical Therapy Treatment Patient Details Name: Leah Velasquez MRN: [id number] DOB: [birth year] Today's Date: 02/20/2020    History of Present Illness Patient is a 79 y/o female presenting to the ED on 02/14/20 due to LE weakness, numbness and falls. PMH of chronic afib on Coumadin, HTN, HFpEF, remote history of lung cancer s/p resection. CT chest showed right apical and suprahilar masses consistent with malignancy with extension into the hilum. Patient admitted for further work up.     PT Comments    Pt a bit perseverative on when she will be transported to SNF. Declining further mobility besides transfers and assistance with pericare after having a BM at the [facility]. She did participate in general LE strengthening therex (see below). Pt would continue to benefit from skilled physical therapy services at this time while admitted and after d/c to address the below listed limitations in order to improve overall safety and independence with functional mobility.    Follow Up Recommendations  SNF     Equipment Recommendations  None recommended by PT    Recommendations for Other Services       Precautions / Restrictions Precautions Precautions: Fall Restrictions Weight Bearing Restrictions: No    Mobility  Bed Mobility Overal bed mobility: Needs Assistance Bed Mobility: Sit to Supine       Sit to supine: Mod assist   General bed mobility comments: assistance needed to return bilateral LEs onto bed  Transfers Overall transfer level: Needs assistance Equipment used: Rolling walker (2 wheeled) Transfers: Sit to/from [facility] Sit to Stand: Mod assist Stand pivot transfers: Min assist       General transfer comment: pt declining further mobility at this time stating that she was too tired; assistance needed to power into standing from [facility], cueing for safe hand placement and min A for stability with pivot steps to bed  Ambulation/Gait             General Gait  Details: pt declining   Stairs             Wheelchair Mobility    Modified Rankin (Stroke Patients Only)       Balance Overall balance assessment: Needs assistance Sitting-balance support: Feet supported Sitting balance-Leahy Scale: Fair     Standing balance support: Bilateral upper extremity supported Standing balance-Leahy Scale: Poor                              Cognition Arousal/Alertness: Awake/alert Behavior During Therapy: WFL for tasks assessed/performed Overall Cognitive Status: Within Functional Limits for tasks assessed                                        Exercises General Exercises - Lower Extremity Ankle Circles/Pumps: AROM;Both;10 reps;Seated Gluteal Sets: AROM;Strengthening;Both;10 reps;Seated Long Arc Quad: AROM;Strengthening;Both;10 reps;Seated Hip ABduction/ADduction: AROM;Strengthening;Both;10 reps;Seated Hip Flexion/Marching: AROM;Strengthening;Both;10 reps;Seated    General Comments        Pertinent Vitals/Pain Pain Assessment: No/denies pain    Home Living                      Prior Function            PT Goals (current goals can now be found in the care plan section) Acute Rehab PT Goals PT Goal Formulation: With patient Time For Goal Achievement: 03/01/20 Potential to  Achieve Goals: Good Progress towards PT goals: Progressing toward goals    Frequency    Min 2X/week      PT Plan Current plan remains appropriate    Co-evaluation              AM-PAC PT "6 Clicks" Mobility   Outcome Measure  Help needed turning from your back to your side while in a flat bed without using bedrails?: A Lot Help needed moving from lying on your back to sitting on the side of a flat bed without using bedrails?: A Lot Help needed moving to and from a bed to a chair (including a wheelchair)?: A Lot Help needed standing up from a chair using your arms (e.g., wheelchair or bedside chair)?: A  Lot Help needed to walk in hospital room?: A Lot Help needed climbing 3-5 steps with a railing? : Total 6 Click Score: 11    End of Session Equipment Utilized During Treatment: Gait belt Activity Tolerance: Patient limited by fatigue Patient left: in bed;with call bell/phone within reach;with bed alarm set Nurse Communication: Mobility status PT Visit Diagnosis: Unsteadiness on feet (R26.81);Other abnormalities of gait and mobility (R26.89);Muscle weakness (generalized) (M62.81);History of falling (Z91.81)     Time: 2549-8264 PT Time Calculation (min) (ACUTE ONLY): 20 min  Charges:  $Therapeutic Activity: 8-22 mins                     Anastasio Champion, DPT  Acute Rehabilitation Services Pager [phone] Office [place] 02/20/2020, 3:13 PM

## 2020-02-20 NOTE — TOC Transition Note (Addendum)
Transition of Care [facility]) - CM/SW Discharge Note *Discharged to [facility] via non-emergency ambulance *[place] - [phone]   Patient Details  Name: Leah Velasquez MRN: [id number] Date of Birth: 04/09/1941  Transition [facility]) CM/SW Contact:  Sable Feil, LCSW Phone Number: 02/20/2020, 2:35 PM   Clinical Narrative:  Patient medically stable for discharge to [place] for ST rehab and insurance authorization received today from [place]. Auth ID #[id number] for 7 days 7/1-7/6; Next review date 7/6; [facility] [place] - ph [phone] and fax number for continued stay clinicals [phone].  Daughter [name] will be contacted once [place] transport arranged.  Discharge clinicals transmitted to [facility].     Final next level of care: [place]) Barriers to Discharge: Barriers Resolved (Received insurance authorization)   Patient Goals and CMS Choice Patient states their goals for this hospitalization and ongoing recovery are:: Patient and daughters agreeable to SNF for ST rehab CMS Medicare.gov Compare Post Acute Care list provided to:: Patient Represenative (must comment) (Medicare.gov SNF list provided to daughters) Choice offered to / list presented to : Adult Children, Patient  Discharge Placement PASRR number recieved: 02/18/20            Patient [facility] bed at: [facility] Patient to be transferred to facility by: Non-emergency ambulance Name of family member notified: Ray Church; [phone] Patient and family notified of of transfer: 02/20/20  Discharge Plan and Services In-house Referral: Clinical Social Work                                 Social Determinants of Health (SDOH) Interventions   No SDOH interventions requested or needed at discharge.   Readmission Risk Interventions No flowsheet data found.

## 2020-02-20 NOTE — Progress Notes (Signed)
SW called [facility] for pick up, pt dressed up and all her belongings are in patient's belonging bag.  Will continue to monitor.

## 2020-02-20 NOTE — Progress Notes (Addendum)
° °  Subjective: Patient examined at bedside. She is alert and oriented. Reports coughing up phlegm post bronchoscopy. Not experiencing any shortness of breath. Discussed her bronch biopsy results, final results are pending but pulm is okay with her discharge to SNF to f/u with oncology outpatient. Patient and family comfortable with discharge to SNF today.   Objective:  Vital signs in last 24 hours: Vitals:   02/19/20 2059 02/20/20 0437 02/20/20 0905 02/20/20 0906  BP: 105/61 (!) 114/54 (!) 133/108 (!) 133/108  Pulse: 70 61 60 60  Resp: 17 17 20 20   Temp: 97.8 F (36.6 C) 97.9 F (36.6 C) 97.7 F (36.5 C) 97.7 F (36.5 C)  TempSrc: Oral   Oral  SpO2: 94% 93% 95% 95%  Weight:      Height:       Physical Exam Vitals and nursing note reviewed.  Constitutional: no acute distress Head: atraumatic ENT: external ears normal Eyes: EOMI Cardiovascular: regular rate and rhythm, normal heart sounds Pulmonary: bilateral wheezing, effort normal, no crackles or rhonchi Abdominal: flat, nontender, no rebound tenderness, bowel sounds normal Skin: warm and dry Neurological: alert, no focal deficit, bilateral LE paresthesia Psychiatric: normal mood and affect   Assessment/Plan: Leah Velasquez is a 79 y/o F with Hx of lung CA s/o R resection, A fib on warfarin, HFpEF, HTN who presented with LE weakness and paresthesia. During workup of this, an R apical lung mass was discovered. Bronch with biopsy yesterday.   Principal Problem:   Lung mass Active Problems:   Frequent falls   Paresthesias  R apical lung mass Hx of lung cancer s/p resection. CT scan with R apical subpleural and R suprahilar masses with extension into R hilum. Nodular density in R upper lobe consistent with satellite nodules. Bronchoscopy performed 6/29, tolerated well. Preliminary results consistent with malignancy. -f/u biopsy results -outpatient oncology appointment made  Lower extremity weakness and paresthesia MR T spine,  L spine, and brain without explanation for her symptoms. DDx of B12 deficiency vs leflunomide side effect. B12 deficient at 173.  -continue B12 supplementation 1071mcg  -PT/OT -SNF placement -holding luflunomide -patient to make outpatient f/u with her rheum about leflunomide  Chronic atrial fibrillation Warfarin held for bronch, now resumed. INR 1.2 today.  -continue warfarin 6mg  daily -outpatient f/u for INR made  HTN BP 841-660Y systolic. -continue Lasix 20mg  daily -continue losartan 25mg daily -continue Toprol 12.5 daily -titrate outpatient  Prior to Admission Living Arrangement: home Anticipated Discharge Location: SNF Barriers to Discharge: none Dispo: Anticipated discharge today  Andrew Au, MD 02/20/2020, 2:05 PM Pager: [phone]  After 5pm on weekdays and 1pm on weekends: On Call pager [phone]

## 2020-02-20 NOTE — Progress Notes (Signed)
Pt walked from her bed to outside her room on room air , O2 Sats dropped to 92%, pt didn't report SOB, pt tolerated well and returned to bed.

## 2020-02-20 NOTE — Progress Notes (Signed)
ANTICOAGULATION CONSULT NOTE - Initial Consult  Pharmacy Consult for Coumadin Indication: atrial fibrillation  Allergies  Allergen Reactions  . Morphine Anaphylaxis  . Morphine And Related Other (See Comments)    "vitals go down"  . Morphine And Related     Pass out   . Sulfa Antibiotics Rash  . Sulfa Antibiotics Rash  . Sulfa Antibiotics Rash    Patient Measurements: Height: 5\' 1"  (154.9 cm) Weight: 102.5 kg (226 lb 0.1 oz) IBW/kg (Calculated) : 47.8  Vital Signs: Temp: 97.9 F (36.6 C) (07/01 0437) Temp Source: Oral (06/30 2059) BP: 114/54 (07/01 0437) Pulse Rate: 61 (07/01 0437)  Labs: Recent Labs    02/18/20 0421 02/18/20 0515 02/19/20 0432 02/20/20 0406  HGB  --  13.4 13.0  --   HCT  --  41.8 40.5  --   PLT  --  142* 151  --   LABPROT 15.7*  --  14.6 14.7  INR 1.3*  --  1.2 1.2  CREATININE  --  0.87 0.79  --     Estimated Creatinine Clearance: 64.8 mL/min (by C-G formula based on SCr of 0.79 mg/dL).   Medical History: Past Medical History:  Diagnosis Date  . A-fib ([facility])   . Hemorrhoids   . Hyperlipidemia   . Hypertension   . Lung cancer ([facility]) 2000   RLL  . Obesity   . Persistent atrial fibrillation ([facility])   . Rheumatoid arthritis ([facility])     Medications:  Medications Prior to Admission  Medication Sig Dispense Refill Last Dose  . albuterol (PROVENTIL HFA;VENTOLIN HFA) 108 (90 Base) MCG/ACT inhaler Inhale 2 puffs into the lungs every 6 (six) hours as needed for wheezing or shortness of breath. 1 Inhaler 0 unknown  . albuterol (VENTOLIN HFA) 108 (90 Base) MCG/ACT inhaler Inhale 1-2 puffs into the lungs every 6 (six) hours as needed for wheezing or shortness of breath.   unknown  . calcium carbonate (TUMS - DOSED IN MG ELEMENTAL CALCIUM) 500 MG chewable tablet Chew 1 tablet (200 mg of elemental calcium total) by mouth 3 (three) times daily as needed for indigestion or heartburn.   02/13/2020 at Unknown time  . Cholecalciferol (VITAMIN D3) 1000 units  CAPS Take 1,000 Units by mouth daily.   02/13/2020 at Unknown time  . conjugated estrogens (PREMARIN) vaginal cream Place 1 Applicatorful vaginally 2 (two) times a week.   02/13/2020 at Unknown time  . Esomeprazole Magnesium (NEXIUM PO) Take 1 capsule by mouth daily as needed (Indigestion).   unknonw  . fluticasone (FLONASE) 50 MCG/ACT nasal spray Place 1 spray into both nostrils daily.    unknown  . furosemide (LASIX) 40 MG tablet Take 1 tablet (40 mg total) by mouth daily. (Patient taking differently: Take 20 mg by mouth daily. ) 90 tablet 1 Past Week at Unknown time  . leflunomide (ARAVA) 20 MG tablet Take 20 mg by mouth daily.   02/13/2020 at Unknown time  . levocetirizine (XYZAL) 5 MG tablet Take 5 mg by mouth every evening.    02/13/2020 at Unknown time  . lidocaine (LIDODERM) 5 % Place 1 patch onto the skin daily. Remove & Discard patch within 12 hours or as directed by MD 30 patch 0 02/14/2020 at Unknown time  . losartan (COZAAR) 50 MG tablet TAKE 1 TABLET DAILY. PLEASE CALL TO SCHEDULE APPOINTMENT FOR FURTHER REFILLS. (Patient taking differently: Take 50 mg by mouth daily. ) 30 tablet 0 02/13/2020 at Unknown time  . metoprolol succinate (TOPROL XL)  25 MG 24 hr tablet Take 0.5 tablets (12.5 mg total) by mouth at bedtime. 45 tablet 3 02/13/2020 at 2200  . simvastatin (ZOCOR) 20 MG tablet Take 20 mg by mouth at bedtime.   02/13/2020 at Unknown time  . timolol (TIMOPTIC) 0.5 % ophthalmic solution Place 1 drop into both eyes in the morning and at bedtime.   02/13/2020 at Unknown time  . traMADol (ULTRAM) 50 MG tablet Take 50 mg by mouth daily as needed for moderate pain.   0 02/13/2020 at Unknown time  . warfarin (COUMADIN) 5 MG tablet Take 2.5-5 mg by mouth See admin instructions. Take 2.5mg  Sunday, Monday, Wednesday and Friday then take 5mg  all the other days   02/13/2020 at 1200  . zolpidem (AMBIEN) 5 MG tablet Take 2.5 mg by mouth at bedtime.   02/13/2020 at Unknown time  . hydrocortisone (ANUSOL-HC) 2.5  % rectal cream Apply rectally 2 times daily (Patient not taking: Reported on 02/14/2020) 28.35 g 1 Not Taking at Unknown time  . potassium chloride SA (KLOR-CON M20) 20 MEQ tablet TAKE 1 TABLET BY MOUTH EVERY DAY (Patient not taking: Reported on 02/14/2020) 90 tablet 0 Not Taking at Unknown time  . warfarin (COUMADIN) 5 MG tablet Take 1/2 to 1 tablet by mouth daily as directed by coumadin clinic (Patient not taking: Reported on 02/14/2020) 90 tablet 0 Not Taking at Unknown time   Scheduled:  . cholecalciferol  1,000 Units Oral Daily  . conjugated estrogens  1 Applicatorful Vaginal Once per day on Mon Thu  . furosemide  20 mg Oral Daily  . losartan  25 mg Oral Daily  . metoprolol succinate  12.5 mg Oral Daily  . pantoprazole  40 mg Oral Daily  . simvastatin  20 mg Oral QHS  . timolol  1 drop Both Eyes BID  . vitamin B-12  1,000 mcg Oral Daily  . Warfarin - Pharmacist Dosing Inpatient   Does not apply q1600   Infusions:  . lactated ringers      Assessment: 79yo female to resume Coumadin for Afib after being held for biopsy, completed yesterday.  Goal of Therapy:  INR 2-3   Plan:  INR 1.2. Will give boosted Coumadin dose of 6mg  PO x1 today and monitor INR for dose adjustments; resume home regimen 2.5mg  MWFSu and 5mg  TTSa on discharge.  Sheneka Schrom A. Levada Dy, PharmD, BCPS, FNKF Clinical Pharmacist [place] Please utilize Amion for appropriate phone number to reach the unit pharmacist ([place])   02/20/2020,7:50 AM

## 2020-02-20 NOTE — Progress Notes (Signed)
Called report to [facility] nurse, pt and family updated.

## 2020-02-20 NOTE — Discharge Summary (Addendum)
Name: Leah Velasquez MRN: [id number] DOB: [birth year] 79 y.o. PCP: [name], MD  Date of Admission: 02/14/2020  3:21 PM Date of Discharge:  02/20/2020 Attending Physician: Oda Kilts, MD  Discharge Diagnosis: 1.  Right apical and right suprahilar lung mass, satellite lung nodules 2.  Progressive lower extremity weakness, sensory neuropathy  Discharge Medications: Allergies as of 02/20/2020       Reactions   Morphine Anaphylaxis   Morphine And Related Other (See Comments)   "vitals go down"   Morphine And Related    Pass out    Sulfa Antibiotics Rash   Sulfa Antibiotics Rash   Sulfa Antibiotics Rash        Medication List     STOP taking these medications    leflunomide 20 MG tablet Commonly known as: ARAVA   potassium chloride SA 20 MEQ tablet Commonly known as: Klor-Con M20       TAKE these medications    albuterol 108 (90 Base) MCG/ACT inhaler Commonly known as: VENTOLIN HFA Inhale 1-2 puffs into the lungs every 6 (six) hours as needed for wheezing or shortness of breath.   albuterol 108 (90 Base) MCG/ACT inhaler Commonly known as: VENTOLIN HFA Inhale 2 puffs into the lungs every 6 (six) hours as needed for wheezing or shortness of breath.   calcium carbonate 500 MG chewable tablet Commonly known as: TUMS - dosed in mg elemental calcium Chew 1 tablet (200 mg of elemental calcium total) by mouth 3 (three) times daily as needed for indigestion or heartburn.   conjugated estrogens vaginal cream Commonly known as: PREMARIN Place 1 Applicatorful vaginally 2 (two) times a week.   cyanocobalamin 1000 MCG tablet Take 1 tablet (1,000 mcg total) by mouth daily.   fluticasone 50 MCG/ACT nasal spray Commonly known as: FLONASE Place 1 spray into both nostrils daily.   furosemide 40 MG tablet Commonly known as: LASIX Take 1 tablet (40 mg total) by mouth daily. What changed: how much to take   hydrocortisone 2.5 % rectal cream Commonly known as:  Anusol-HC Apply rectally 2 times daily   levocetirizine 5 MG tablet Commonly known as: XYZAL Take 5 mg by mouth every evening.   lidocaine 5 % Commonly known as: Lidoderm Place 1 patch onto the skin daily. Remove & Discard patch within 12 hours or as directed by MD   losartan 50 MG tablet Commonly known as: COZAAR TAKE 1 TABLET DAILY. PLEASE CALL TO SCHEDULE APPOINTMENT FOR FURTHER REFILLS. What changed: See the new instructions.   metoprolol succinate 25 MG 24 hr tablet Commonly known as: Toprol XL Take 0.5 tablets (12.5 mg total) by mouth at bedtime.   NEXIUM PO Take 1 capsule by mouth daily as needed (Indigestion).   simvastatin 20 MG tablet Commonly known as: ZOCOR Take 20 mg by mouth at bedtime.   timolol 0.5 % ophthalmic solution Commonly known as: TIMOPTIC Place 1 drop into both eyes in the morning and at bedtime.   traMADol 50 MG tablet Commonly known as: ULTRAM Take 50 mg by mouth daily as needed for moderate pain.   Vitamin D3 25 MCG (1000 UT) Caps Take 1,000 Units by mouth daily.   warfarin 5 MG tablet Commonly known as: COUMADIN Take as directed. If you are unsure how to take this medication, talk to your nurse or doctor. Original instructions: Take 2.5-5 mg by mouth See admin instructions. Take 2.5mg  Sunday, Monday, Wednesday and Friday then take 5mg  all the other days  warfarin 5 MG tablet Commonly known as: COUMADIN Take as directed. If you are unsure how to take this medication, talk to your nurse or doctor. Original instructions: Take 1/2 to 1 tablet by mouth daily as directed by coumadin clinic   zolpidem 5 MG tablet Commonly known as: AMBIEN Take 2.5 mg by mouth at bedtime.        Disposition and follow-up:   LeahLeah Velasquez was discharged from [facility] in Stable condition.  At the hospital follow up visit please address:  A.  Right apical and right suprahilar lung mass, satellite lung nodules: You should be receiving  a call from our oncology team to set up an appointment.  Located in: [facility] Address: [address], [place], [place] [address] Phone: [phone]  B.  Progressive lower extremity weakness, sensory neuropathy: Please continue vitamin B12  2.  Labs / imaging needed at time of follow-up: None  3.  Pending labs/ test needing follow-up: Follow-up MMA, cytology from lung nodule biopsy  Follow-up Appointments:  Follow-up Information     [name], MD. Call in 1 week(s).   Specialty: Family Medicine Contact information: [place] [address] [phone]         Lorretta Harp, MD. Go on 02/25/2020.   Specialties: Cardiology, Radiology Why: Please go for INR check. Please go to [place], [place] [id number] at 1:45 pm   Contact information: [address] [place] [address] [place] by problem list: Leah Velasquez is a 79 y.o. with a pertinent PMH of  chronic afib on Coumadin, HTN, HFpEF, RA, remote lung cancer s/p resection , who presented with progressive lower extremity weakness with recurrent falls and numbness in her feet which was felt to be due to vitamin B-12 deficiency for which she was subsequently started on vitamin B12 supplements.  She was found to have right apical and right subhilar lung masses with satellite lung nodules on CT chest for which she underwent flexible video fiberoptic bronchoscopy with electromagnetic navigation and biopsy on 02/18/2020.  She tolerated procedure well and preliminary cytology per pulmonology was reported likely malignancy.  Final cytology results is pending.  We have made our oncology team aware and schedule will be provided.  It is worth noting that the endobronchial biopsies of the right upper lobe bronchus showed no evidence of malignancy, but pulmonology expects the cytology to be diagnostic of  malignancy. Full pathology results are pending.   Discharge Vitals:   BP (!) 133/108 (BP Location: Right Wrist)   Pulse 60   Temp 97.7 F (36.5 C) (Oral)   Resp 20   Ht 5\' 1"  (1.549 m)   Wt 102.5 kg   SpO2 95%   BMI 42.70 kg/m   Pertinent Labs, Studies, and Procedures:  CT CHEST WITH CONTRAST   TECHNIQUE: Multidetector CT imaging of the chest was performed during intravenous contrast administration.   CONTRAST:  50mL OMNIPAQUE IOHEXOL 300 MG/ML  SOLN   COMPARISON:  Chest radiograph dated 10/23/2017.   FINDINGS: Evaluation of this exam is limited in the absence of intravenous contrast.   Cardiovascular: Borderline cardiomegaly with mild dilatation of the left atrium. There is coronary vascular calcification primarily involving the LAD and RCA. There is advanced atherosclerotic calcification of the thoracic aorta. No aneurysmal dilatation or dissection.  The origins of the great vessels of the aortic arch appear patent as visualized. The central pulmonary arteries appear patent. Evaluation of the pulmonary arteries is limited due to suboptimal opacification and timing of the contrast.   Mediastinum/Nodes: There is a lobulated mass or enlarged abnormal lymph node in the mediastinum anterior to the right mainstem bronchus measuring 2.5 x 2.3 cm most consistent with metastatic disease. There is extension of this mass into the right hilum. The esophagus is grossly unremarkable. No mediastinal fluid collection.   Lungs/Pleura: There is a lobulated right apical subpleural nodule measuring 2.8 x 2.1 cm extending inferiorly to the right suprahilar region consistent with malignancy. There is a 2.0 x 1.6 cm right suprahilar soft tissue nodule. There is a cluster of nodular density in the right upper lobe (54/4) extending to the right lateral pleural surface and along the right minor fissure most consistent with satellite nodules. There is no lobar consolidation,  pleural effusion, pneumothorax. Mild background of emphysema. There is mild narrowing of the right upper lobe bronchus centrally secondary to mass effect and compression by the right hilar/mediastinal mass. The central airways however remain patent.   Upper Abdomen: Subcentimeter left renal upper pole hypodense focus. The visualized upper abdomen is otherwise unremarkable.   Musculoskeletal: There is osteopenia with degenerative changes of the spine. T9 hemangioma. No acute osseous pathology.   IMPRESSION: 1. Right apical subpleural and right suprahilar masses consistent with malignancy. There is extension of this mass into the right hilum. 2. Right mediastinal soft tissue mass or abnormal lymph node with extension to the right hilum. Bronchoscopy may provide better evaluation. 3. A cluster of nodular density in the right upper lobe extending to the right lateral pleural surface and along the right minor fissure most consistent with satellite nodules. 4. Aortic Atherosclerosis (ICD10-I70.0) and Emphysema (ICD10-J43.9).   MRI HEAD WITHOUT AND WITH CONTRAST   TECHNIQUE: Multiplanar, multiecho pulse sequences of the brain and surrounding structures were obtained without and with intravenous contrast.   CONTRAST:  59mL GADAVIST GADOBUTROL 1 MMOL/ML IV SOLN   COMPARISON:  None.   FINDINGS: Brain: There is no acute infarction or intracranial hemorrhage. There is no intracranial mass, mass effect, or edema. There is no hydrocephalus or extra-axial fluid collection. Prominence of the ventricles and sulci reflects parenchymal volume loss. 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 Additional patchy and confluent T2 hyperintensity in the supratentorial white matter is nonspecific but may reflect moderate to advanced chronic microvascular ischemic changes. Ex vacuo dilatation of the lateral ventricles. No  abnormal enhancement.   Vascular: Major vessel flow voids at the skull base are preserved.   Skull and upper cervical spine: Normal marrow signal is preserved.   Sinuses/Orbits: Paranasal sinuses are aerated. Orbits are unremarkable.   Other: Sella is unremarkable.  Mastoid air cells are clear.   IMPRESSION: No evidence of recent infarction, hemorrhage, or mass.   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 microvascular ischemic changes.   MRI THORACIC AND LUMBAR SPINE WITHOUT CONTRAST   TECHNIQUE: Multiplanar and multiecho pulse sequences of the thoracic and lumbar spine were obtained without intravenous contrast.   COMPARISON:  Limited correlation made with chest radiographs 10/23/2017 and lumbar spine radiographs 01/30/2020.   FINDINGS: MRI THORACIC SPINE FINDINGS   Alignment: The alignment of the thoracic spine is normal. There are moderately advanced degenerative changes in the cervical spine associated with a mild anterolisthesis at C4-5.   Vertebrae:  No acute or suspicious osseous findings. Multilevel cervical spondylosis with endplate degenerative changes. No evidence of osseous metastatic disease.   Cord:  The thoracic cord appears normal in signal and caliber.   Paraspinal and other soft tissues: There is an irregular mass medially in the right upper lobe, measuring 2.9 x 2.4 cm on image 9/22. There are associated enlarged right hilar and paratracheal lymph nodes, measuring up to 2.1 cm on image 14/22. These findings are worrisome for bronchogenic carcinoma, and further evaluation with chest CT recommended. No focal paraspinal abnormalities.   Disc levels:   The thoracic spinal canal is widely patent. There are minor thoracic spine degenerative changes, and no evidence of disc herniation, spinal stenosis or nerve root encroachment. As above, moderate cervical spondylosis is present without gross cord deformity.   MRI LUMBAR SPINE  FINDINGS   Segmentation: There are 5 lumbar type vertebral bodies.   Alignment: Minimal convex right scoliosis. Minimal degenerative anterolisthesis at L4-5 and L5-S1.   Vertebrae: No worrisome osseous lesion, acute fracture or pars defect. No evidence of osseous metastatic disease. The visualized sacroiliac joints appear unremarkable.   Conus medullaris: Extends to the L1 level and appears normal.   Paraspinal and other soft tissues: No significant paraspinal findings. Left renal cortical thinning and cysts are noted.   Disc levels:   L1-2: Mild disc bulging. No spinal stenosis or nerve root encroachment.   L2-3: Normal interspace.   L3-4: Mild disc bulging, facet and ligamentous hypertrophy. No significant spinal stenosis or nerve root encroachment.   L4-5: Mild disc bulging with moderate facet and ligamentous hypertrophy. Mild spinal stenosis with mild narrowing of the lateral recesses and right foramen. No nerve root encroachment.   L5-S1: Mild disc bulging and facet hypertrophy. No significant spinal stenosis or nerve root encroachment.   IMPRESSION: 1. Irregular mass medially in the right upper lobe with associated right hilar and paratracheal adenopathy, highly worrisome for bronchogenic carcinoma. Further evaluation with chest CT with contrast recommended. 2. No acute findings or clear explanation for the patient's symptoms in the spine. No evidence of osseous metastatic disease. 3. Mild multifactorial spinal stenosis at L4-5 with mild narrowing of the lateral recesses and right foramen. 4. Mild disc bulging and facet hypertrophy at the other levels as described. No high-grade spinal stenosis or nerve root encroachment. 5. 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   MRI THORACIC AND LUMBAR SPINE WITHOUT CONTRAST   TECHNIQUE: Multiplanar and multiecho pulse sequences of the thoracic and lumbar spine were obtained without intravenous  contrast.   COMPARISON:  Limited correlation made with chest radiographs 10/23/2017 and lumbar spine radiographs 01/30/2020.   FINDINGS: MRI THORACIC SPINE FINDINGS   Alignment: The alignment of the thoracic spine is normal. There are moderately advanced degenerative changes in the cervical spine associated with a mild anterolisthesis at C4-5.   Vertebrae: No acute or suspicious osseous findings. Multilevel cervical spondylosis with endplate degenerative changes. No evidence of osseous metastatic disease.   Cord:  The thoracic cord appears normal in signal and caliber.   Paraspinal and other soft tissues: There is an irregular mass medially in the right upper lobe, measuring 2.9 x 2.4 cm on image 9/22. There are associated enlarged right hilar and paratracheal lymph nodes, measuring up to 2.1 cm on image 14/22. These findings are worrisome for bronchogenic carcinoma, and further evaluation with chest CT recommended. No focal paraspinal abnormalities.   Disc levels:   The thoracic spinal canal is widely patent. There  are minor thoracic spine degenerative changes, and no evidence of disc herniation, spinal stenosis or nerve root encroachment. As above, moderate cervical spondylosis is present without gross cord deformity.   MRI LUMBAR SPINE FINDINGS   Segmentation: There are 5 lumbar type vertebral bodies.   Alignment: Minimal convex right scoliosis. Minimal degenerative anterolisthesis at L4-5 and L5-S1.   Vertebrae: No worrisome osseous lesion, acute fracture or pars defect. No evidence of osseous metastatic disease. The visualized sacroiliac joints appear unremarkable.   Conus medullaris: Extends to the L1 level and appears normal.   Paraspinal and other soft tissues: No significant paraspinal findings. Left renal cortical thinning and cysts are noted.   Disc levels:   L1-2: Mild disc bulging. No spinal stenosis or nerve root encroachment.   L2-3: Normal  interspace.   L3-4: Mild disc bulging, facet and ligamentous hypertrophy. No significant spinal stenosis or nerve root encroachment.   L4-5: Mild disc bulging with moderate facet and ligamentous hypertrophy. Mild spinal stenosis with mild narrowing of the lateral recesses and right foramen. No nerve root encroachment.   L5-S1: Mild disc bulging and facet hypertrophy. No significant spinal stenosis or nerve root encroachment.   IMPRESSION: 1. Irregular mass medially in the right upper lobe with associated right hilar and paratracheal adenopathy, highly worrisome for bronchogenic carcinoma. Further evaluation with chest CT with contrast recommended. 2. No acute findings or clear explanation for the patient's symptoms in the spine. No evidence of osseous metastatic disease. 3. Mild multifactorial spinal stenosis at L4-5 with mild narrowing of the lateral recesses and right foramen. 4. Mild disc bulging and facet hypertrophy at the other levels as described. No high-grade spinal stenosis or nerve root encroachment. 5. 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    Discharge Instructions: Discharge Instructions     Call MD for:  extreme fatigue   Complete by: As directed    Call MD for:  persistant dizziness or light-headedness   Complete by: As directed    Diet - low sodium heart healthy   Complete by: As directed    Discharge instructions   Complete by: As directed    Leah Velasquez, it was a pleasure taking care of you. Follow up with your oncologist, from whom you will be receiving a call. Follow up with your rheumatologist on restarting your leflunomide. Please call them to make an appointment. Follow up with your PCP.   Increase activity slowly   Complete by: As directed        Signed: Berdina Rosenthal, MD 02/20/2020, 11:55 AM   Pager: [phone] Internal Medicine Teaching Service

## 2020-02-21 ENCOUNTER — Telehealth: Payer: Self-pay | Admitting: Emergency Medicine

## 2020-02-21 LAB — CYTOLOGY - NON PAP

## 2020-02-21 NOTE — Telephone Encounter (Signed)
Dr. Lamonte Sakai please advise on patient's Bronch results. Daughter Jackelyn Poling is calling because pt's husband id very hard of hearing. Daughter Jackelyn Poling would like to be called at [phone] and then would like patient called on her cell phone since she is in a SNF at this time she can be reached at [phone].

## 2020-02-21 NOTE — Telephone Encounter (Signed)
Discussed path results with the patient's daughter and the patient herself. 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 She has an OV with Dr Julien Nordmann on 02/27/20.

## 2020-02-26 ENCOUNTER — Telehealth: Payer: Self-pay | Admitting: Medical Oncology

## 2020-02-26 NOTE — Telephone Encounter (Signed)
Family member requested to be conferenced in for appt tomorrow. I LVM to return my call.

## 2020-02-27 ENCOUNTER — Inpatient Hospital Stay: Payer: Medicare HMO | Attending: Internal Medicine | Admitting: Internal Medicine

## 2020-02-27 ENCOUNTER — Other Ambulatory Visit: Payer: Self-pay

## 2020-02-27 ENCOUNTER — Inpatient Hospital Stay: Payer: Medicare HMO

## 2020-02-27 ENCOUNTER — Other Ambulatory Visit: Payer: Self-pay | Admitting: *Deleted

## 2020-02-27 ENCOUNTER — Ambulatory Visit
Admission: RE | Admit: 2020-02-27 | Discharge: 2020-02-27 | Disposition: A | Discharge status: Home / Self Care | Payer: Medicare HMO | Source: Ambulatory Visit | Attending: Radiation Oncology | Admitting: Radiation Oncology

## 2020-02-27 ENCOUNTER — Encounter: Payer: Self-pay | Admitting: Internal Medicine

## 2020-02-27 DIAGNOSIS — I10 Essential (primary) hypertension: Secondary | ICD-10-CM

## 2020-02-27 DIAGNOSIS — R918 Other nonspecific abnormal finding of lung field: Secondary | ICD-10-CM

## 2020-02-27 DIAGNOSIS — Z7901 Long term (current) use of anticoagulants: Secondary | ICD-10-CM | POA: Diagnosis not present

## 2020-02-27 DIAGNOSIS — I4891 Unspecified atrial fibrillation: Secondary | ICD-10-CM | POA: Diagnosis not present

## 2020-02-27 DIAGNOSIS — Z85118 Personal history of other malignant neoplasm of bronchus and lung: Secondary | ICD-10-CM | POA: Diagnosis not present

## 2020-02-27 DIAGNOSIS — E669 Obesity, unspecified: Secondary | ICD-10-CM | POA: Insufficient documentation

## 2020-02-27 DIAGNOSIS — Z87891 Personal history of nicotine dependence: Secondary | ICD-10-CM

## 2020-02-27 DIAGNOSIS — R0789 Other chest pain: Secondary | ICD-10-CM | POA: Diagnosis not present

## 2020-02-27 DIAGNOSIS — R5383 Other fatigue: Secondary | ICD-10-CM | POA: Insufficient documentation

## 2020-02-27 DIAGNOSIS — R599 Enlarged lymph nodes, unspecified: Secondary | ICD-10-CM | POA: Insufficient documentation

## 2020-02-27 DIAGNOSIS — I4819 Other persistent atrial fibrillation: Secondary | ICD-10-CM | POA: Diagnosis not present

## 2020-02-27 DIAGNOSIS — R0609 Other forms of dyspnea: Secondary | ICD-10-CM | POA: Diagnosis not present

## 2020-02-27 DIAGNOSIS — R05 Cough: Secondary | ICD-10-CM | POA: Insufficient documentation

## 2020-02-27 DIAGNOSIS — R531 Weakness: Secondary | ICD-10-CM | POA: Diagnosis not present

## 2020-02-27 DIAGNOSIS — C3490 Malignant neoplasm of unspecified part of unspecified bronchus or lung: Secondary | ICD-10-CM | POA: Insufficient documentation

## 2020-02-27 DIAGNOSIS — Z6841 Body Mass Index (BMI) 40.0 and over, adult: Secondary | ICD-10-CM | POA: Insufficient documentation

## 2020-02-27 DIAGNOSIS — M069 Rheumatoid arthritis, unspecified: Secondary | ICD-10-CM

## 2020-02-27 DIAGNOSIS — C3491 Malignant neoplasm of unspecified part of right bronchus or lung: Secondary | ICD-10-CM | POA: Insufficient documentation

## 2020-02-27 LAB — CBC WITH DIFFERENTIAL (CANCER CENTER ONLY)
Abs Immature Granulocytes: 0.02 10*3/uL (ref 0.00–0.07)
Basophils Absolute: 0.1 10*3/uL (ref 0.0–0.1)
Basophils Relative: 1 %
Eosinophils Absolute: 0.3 10*3/uL (ref 0.0–0.5)
Eosinophils Relative: 5 %
HCT: 43.3 % (ref 36.0–46.0)
Hemoglobin: 14.4 g/dL (ref 12.0–15.0)
Immature Granulocytes: 0 %
Lymphocytes Relative: 25 %
Lymphs Abs: 1.3 10*3/uL (ref 0.7–4.0)
MCH: 31.6 pg (ref 26.0–34.0)
MCHC: 33.3 g/dL (ref 30.0–36.0)
MCV: 95.2 fL (ref 80.0–100.0)
Monocytes Absolute: 0.7 10*3/uL (ref 0.1–1.0)
Monocytes Relative: 13 %
Neutro Abs: 2.9 10*3/uL (ref 1.7–7.7)
Neutrophils Relative %: 56 %
Platelet Count: 165 10*3/uL (ref 150–400)
RBC: 4.55 MIL/uL (ref 3.87–5.11)
RDW: 14.1 % (ref 11.5–15.5)
WBC Count: 5.2 10*3/uL (ref 4.0–10.5)
nRBC: 0 % (ref 0.0–0.2)

## 2020-02-27 NOTE — Progress Notes (Signed)
Radiation Oncology         [phone] ________________________________  Multidisciplinary Thoracic Oncology Clinic [facility]) Initial Outpatient Consultation  Name: TINY CHAUDHARY MRN: [id number]  Date: 02/27/2020  DOB: Aug 24, 1940  DG:LOVFIEP, Jannifer Rodney, MD  Collene Gobble, MD   REFERRING PHYSICIAN: Collene Gobble, MD  DIAGNOSIS: The encounter diagnosis was Lung mass.    ICD-10-CM   1. 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 female who is seen as a courtesy of Dr. Lamonte Sakai for an opinion concerning radiation therapy as part of management for her recurrent lung cancer. Previously, the patient had undergone a right lower lobe lobectomy for early stage lung cancer in 2000 while in [place]. Most recently, the patient presented to the ED on 01/29/2020 status post fall. CT of cervical spine at that time showed an incidental 2.7 x 2.1 x 2.5 cm low-attenuation right apical mass that was concerning for the presence of a pulmonary neoplasm.  The patient was seen in the ED again on 02/07/2020 status post fall with an essentially negative workup including a head CT and pelvic CT.   The patient was seen in the ED for a third time on 02/14/2020 for evaluation of weakness and difficulty ambulating. MRI of thoracic and lumbar spine showed an irregular mass medially in the right upper lobe with associated right hilar and paratracheal adenopathy that was highly worrisome for bronchogenic carcinoma. There was also mild multifocal spinal stenosis at L4-5 with mild narrowing of the lateral recesses and right foramen. Finally, there was mild disc bulging and facet hypertrophy at the other levels and moderate to severe degenerative changes in the cervical spine. There was no evidence of osseous metastatic disease. CT of chest showed right apical subpleural and right suprahilar masses consistent with malignancy. The right  mediastinal soft tissue mass or abnormal lymph node extended to the right hilum. Finally, there was a cluster of nodular density in the right upper lobe that extended to the right lateral pleural surface and along the right minor fissure that was most consistent with satellite nodules. The patient was admitted to the hospital for further management.  MRI of brain on 02/15/2020 did not show any evidence of recent infarction, hemorrhage, or mass.  While hospitalized, the patient was evaluated by Dr. Chase Caller on 02/15/2020, who recommended that she proceed with a biopsy. She was then seen by Dr. Lamonte Sakai on 02/17/2020, who agreed with bronchoscopy given the concern for malignancy. This was performed as an inpatient on 02/18/2020. Pathology from the procedure revealed fragments of benign bronchial wall with a focal organizing hemorrhage of the right upper lobe. There was no evidence of malignancy. Cytology from the procedure revealed neoplastic cells in the right upper lobe brushing but no malignant cells in the right upper lobe fine needle aspiration. She was stabilized and discharged to a skilled nursing facility on 02/20/2020.  Given that the pathology results showed neuro-endocrine features, it is suspected that the patient has [place]. Thus, she was referred today for presentation in the multidisciplinary conference. Radiology studies and pathology slides were presented there for review and discussion of treatment options. A consensus was discussed regarding potential next steps.  PREVIOUS RADIATION THERAPY: No  PAST MEDICAL HISTORY:  has a past medical history of A-fib ([place]), Hemorrhoids, Hyperlipidemia, Hypertension, Lung cancer ([place]) (2000), Obesity, Persistent atrial fibrillation ([place]), and Rheumatoid arthritis ([place]).  PAST SURGICAL HISTORY: Past Surgical History:  Procedure Laterality Date  . BREAST EXCISIONAL BIOPSY Left    x 2  . BREAST EXCISIONAL BIOPSY Right   . BRONCHIAL BIOPSY  02/18/2020     Procedure: BRONCHIAL BIOPSIES;  Surgeon: Collene Gobble, MD;  Location: [facility] ENDOSCOPY;  Service: Cardiopulmonary;;  . BRONCHIAL BRUSHINGS  02/18/2020   Procedure: BRONCHIAL BRUSHINGS;  Surgeon: Collene Gobble, MD;  Location: [place];  Service: Cardiopulmonary;;  . BRONCHIAL NEEDLE ASPIRATION BIOPSY  02/18/2020   Procedure: BRONCHIAL NEEDLE ASPIRATION BIOPSIES;  Surgeon: Collene Gobble, MD;  Location: [place];  Service: Cardiopulmonary;;  . BRONCHIAL WASHINGS  02/18/2020   Procedure: BRONCHIAL WASHINGS;  Surgeon: Collene Gobble, MD;  Location: [place];  Service: Cardiopulmonary;;  . CARDIOVERSION N/A 01/09/2018   Procedure: CARDIOVERSION;  Surgeon: Josue Hector, MD;  Location: [facility] ENDOSCOPY;  Service: Cardiovascular;  Laterality: N/A;  . CARDIOVERSION N/A 03/01/2018   Procedure: CARDIOVERSION;  Surgeon: Sueanne Margarita, MD;  Location: [facility] ENDOSCOPY;  Service: Cardiovascular;  Laterality: N/A;  . ELECTROMAGNETIC NAVIGATION BROCHOSCOPY N/A 02/18/2020   Procedure: ELECTROMAGNETIC NAVIGATION BRONCHOSCOPY;  Surgeon: Collene Gobble, MD;  Location: [place];  Service: Cardiopulmonary;  Laterality: N/A;  . KNEE SURGERY    . LUNG LOBECTOMY Right 2000   RLL removed  . VIDEO BRONCHOSCOPY N/A 02/18/2020   Procedure: VIDEO BRONCHOSCOPY WITH FLUORO;  Surgeon: Collene Gobble, MD;  Location: [facility] ENDOSCOPY;  Service: Cardiopulmonary;  Laterality: N/A;    FAMILY HISTORY: family history is not on file.  SOCIAL HISTORY:  reports that she has never smoked. She has never used smokeless tobacco. She reports that she does not drink alcohol and does not use drugs.  ALLERGIES: Morphine, Morphine and related, Morphine and related, Sulfa antibiotics, Sulfa antibiotics, and Sulfa antibiotics  MEDICATIONS:  Current Outpatient Medications  Medication Sig Dispense Refill  . albuterol (VENTOLIN HFA) 108 (90 Base) MCG/ACT inhaler Inhale 1-2 puffs into the lungs every 6 (six) hours as needed for  wheezing or shortness of breath. (Patient not taking: Reported on 02/27/2020)    . calcium carbonate (TUMS - DOSED IN MG ELEMENTAL CALCIUM) 500 MG chewable tablet Chew 1 tablet (200 mg of elemental calcium total) by mouth 3 (three) times daily as needed for indigestion or heartburn.    . Cholecalciferol (VITAMIN D3) 1000 units CAPS Take 1,000 Units by mouth daily.    Marland Kitchen conjugated estrogens (PREMARIN) vaginal cream Place 1 Applicatorful vaginally 2 (two) times a week.    . Esomeprazole Magnesium (NEXIUM PO) Take 1 capsule by mouth daily as needed (Indigestion).    . fluticasone (FLONASE) 50 MCG/ACT nasal spray Place 1 spray into both nostrils daily.     . furosemide (LASIX) 40 MG tablet Take 1 tablet (40 mg total) by mouth daily. (Patient taking differently: Take 20 mg by mouth daily. ) 90 tablet 1  . hydrocortisone (ANUSOL-HC) 2.5 % rectal cream Apply rectally 2 times daily 28.35 g 1  . levocetirizine (XYZAL) 5 MG tablet Take 5 mg by mouth every evening.     . lidocaine (LIDODERM) 5 % Place 1 patch onto the skin daily. Remove & Discard patch within 12 hours or as directed by MD 30 patch 0  . losartan (COZAAR) 50 MG tablet TAKE 1 TABLET DAILY. PLEASE CALL TO SCHEDULE APPOINTMENT FOR FURTHER REFILLS. (Patient taking differently: Take 50 mg by mouth daily. ) 30 tablet 0  . metoprolol succinate (TOPROL XL) 25 MG 24 hr tablet Take 0.5  tablets (12.5 mg total) by mouth at bedtime. 45 tablet 3  . simvastatin (ZOCOR) 20 MG tablet Take 20 mg by mouth at bedtime.    . timolol (TIMOPTIC) 0.5 % ophthalmic solution Place 1 drop into both eyes in the morning and at bedtime.    . traMADol (ULTRAM) 50 MG tablet Take 50 mg by mouth daily as needed for moderate pain.   0  . vitamin B-12 1000 MCG tablet Take 1 tablet (1,000 mcg total) by mouth daily. 30 tablet 0  . warfarin (COUMADIN) 5 MG tablet Take 2.5-5 mg by mouth See admin instructions. Take 2.5mg  Sunday, Monday, Wednesday and Friday then take 5mg  all the other days     . zolpidem (AMBIEN) 5 MG tablet Take 2.5 mg by mouth at bedtime.     No current facility-administered medications for this encounter.    REVIEW OF SYSTEMS:  REVIEW OF SYSTEMS: A 10+ POINT REVIEW OF SYSTEMS WAS OBTAINED including neurology, dermatology, psychiatry, cardiac, respiratory, lymph, extremities, GI, GU, musculoskeletal, constitutional, reproductive, HEENT. All pertinent positives are noted in the HPI. All others are negative.  She denies any pain from the chest area cough or hemoptysis.  She denies any swallowing difficulties   PHYSICAL EXAM:  Vitals - 1 value per visit 0/01/2375  SYSTOLIC 93  DIASTOLIC 70  Pulse 88  Temperature 97.5  Respirations 18  Weight (lb) 234  Height 5\' 1"   BMI 44.21  VISIT REPORT    General: Alert and oriented, in no acute distress, sitting comfortably in wheelchair.  Accompanied by step daughter-in-law, ambulates minimally with a walker HEENT: Head is normocephalic. Extraocular movements are intact.  Neck: Neck is supple, no palpable cervical or supraclavicular lymphadenopathy. Heart: Regular in rate and rhythm with no murmurs, rubs, or gallops. Chest: Clear to auscultation bilaterally, with no rhonchi, wheezes, or rales. Abdomen: Soft, nontender, nondistended, with no rigidity or guarding. Extremities: No cyanosis or edema. Lymphatics: see Neck Exam Skin: No concerning lesions. Musculoskeletal: symmetric strength and muscle tone throughout. Neurologic: Cranial nerves II through XII are grossly intact. No obvious focalities. Speech is fluent. Coordination is intact. Psychiatric: Judgment and insight are intact. Affect is appropriate.    KPS = 50  100 - Normal; no complaints; no evidence of disease. 90   - Able to carry on normal activity; minor signs or symptoms of disease. 80   - Normal activity with effort; some signs or symptoms of disease. 10   - Cares for self; unable to carry on normal activity or to do active work. 60   - Requires  occasional assistance, but is able to care for most of his personal needs. 50   - Requires considerable assistance and frequent medical care. 69   - Disabled; requires special care and assistance. 41   - Severely disabled; hospital admission is indicated although death not imminent. 63   - Very sick; hospital admission necessary; active supportive treatment necessary. 10   - Moribund; fatal processes progressing rapidly. 0     - Dead  Karnofsky DA, Abelmann [place], Craver LS and Burchenal [facility] [phone]) The use of the nitrogen mustards in the palliative treatment of carcinoma: with particular reference to bronchogenic carcinoma Cancer 1 [id number]  LABORATORY DATA:  Lab Results  Component Value Date   WBC 5.2 02/27/2020   HGB 14.4 02/27/2020   HCT 43.3 02/27/2020   MCV 95.2 02/27/2020   PLT 165 02/27/2020   Lab Results  Component Value Date   NA 136 02/19/2020  K 4.3 02/19/2020   CL 101 02/19/2020   CO2 25 02/19/2020   Lab Results  Component Value Date   ALT 38 02/19/2020   AST 31 02/19/2020   ALKPHOS 68 02/19/2020   BILITOT 0.7 02/19/2020    PULMONARY FUNCTION TEST:   Recent Review Flowsheet Data   There is no flowsheet data to display.     RADIOGRAPHY: DG Lumbar Spine Complete  Result Date: 01/30/2020 CLINICAL DATA:  Pain EXAM: LUMBAR SPINE - COMPLETE 4+ VIEW COMPARISON:  None. FINDINGS: Examination is significantly limited by patient body habitus. There is no definite acute displaced fracture. No definite malalignment. Multilevel degenerative changes are noted throughout the lumbar spine. Advanced vascular calcifications are noted. IMPRESSION: 1. Very limited study secondary to patient body habitus. 2. No definite acute displaced fracture identified on this study. Electronically Signed   By: Constance Holster M.D.   On: 01/30/2020 00:22   DG Sacrum/Coccyx  Result Date: 02/15/2020 CLINICAL DATA:  Fall with sacrococcygeal pain. EXAM: SACRUM AND COCCYX - 2+ VIEW COMPARISON:  None.  FINDINGS: Mild diffuse decreased bone mineralization. No evidence of acute fracture. Mild degenerate change of the sacroiliac joints, spine and hips. Contrast is present within the bladder IMPRESSION: No acute fracture. Electronically Signed   By: Marin Olp M.D.   On: 02/15/2020 12:32   DG Shoulder Right  Result Date: 01/30/2020 CLINICAL DATA:  Pain EXAM: RIGHT SHOULDER - 2+ VIEW COMPARISON:  None. FINDINGS: There is no acute displaced fracture or dislocation. Moderate degenerative changes are noted of the right glenohumeral joint. There are multiple old healed right-sided rib fractures. There are degenerative changes of the right AC joint. IMPRESSION: 1. No acute displaced fracture or dislocation. 2. Moderate degenerative changes of the right glenohumeral joint. Electronically Signed   By: Constance Holster M.D.   On: 01/30/2020 00:22   CT Head Wo Contrast  Result Date: 02/07/2020 CLINICAL DATA:  Fall getting up to go to the bathroom. On anticoagulation. Head trauma, minor (Age >= 65y) EXAM: CT HEAD WITHOUT CONTRAST TECHNIQUE: Contiguous axial images were obtained from the base of the skull through the vertex without intravenous contrast. COMPARISON:  Head CT 9 days ago 01/29/2020 FINDINGS: Brain: Generalized atrophy and chronic small vessel ischemia, stable from prior exam. Asymmetric sulcal prominence throughout the left hemisphere is unchanged. No acute hemorrhage or subdural collection. No midline shift or mass effect. 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 No evidence of acute ischemia. Vascular: Atherosclerosis of skullbase vasculature without hyperdense vessel or abnormal calcification. Skull: No fracture or focal lesion. Tiny cortical outpouching in the right frontal bone is unchanged, benign. Sinuses/Orbits: Minor mucosal thickening of ethmoid air cells. Bilateral cataract resection. No acute findings. Other: None. IMPRESSION: 1. No acute intracranial abnormality. No skull fracture. 2.  Stable atrophy and chronic small vessel ischemia. Electronically Signed   By: Keith Rake M.D.   On: 02/07/2020 23:37   CT Head Wo Contrast  Result Date: 01/29/2020 CLINICAL DATA:  Status post fall. EXAM: CT HEAD WITHOUT CONTRAST TECHNIQUE: Contiguous axial images were obtained from the base of the skull through the vertex without intravenous contrast. COMPARISON:  None. FINDINGS: Brain: There is moderate severity cerebral atrophy with widening of the extra-axial spaces and ventricular dilatation. Asymmetric sulcal prominence is noted throughout the left hemisphere. There are areas of decreased attenuation within the white matter tracts of the supratentorial brain, consistent with microvascular disease changes. 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 Vascular: No hyperdense vessel or  unexpected calcification. Skull: Normal. Negative for fracture or focal lesion. Sinuses/Orbits: No acute finding. Other: None. IMPRESSION: 1. Generalized cerebral atrophy. 2. 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 3. No acute intracranial abnormality. Electronically Signed   By: Virgina Norfolk M.D.   On: 01/29/2020 23:51   CT CHEST W CONTRAST  Result Date: 02/14/2020 CLINICAL DATA:  79 year old female with with generalized weakness and not being able to stand. EXAM: CT CHEST WITH CONTRAST TECHNIQUE: Multidetector CT imaging of the chest was performed during intravenous contrast administration. CONTRAST:  32mL OMNIPAQUE IOHEXOL 300 MG/ML  SOLN COMPARISON:  Chest radiograph dated 10/23/2017. FINDINGS: Evaluation of this exam is limited in the absence of intravenous contrast. Cardiovascular: Borderline cardiomegaly with mild dilatation of the left atrium. There is coronary vascular calcification primarily involving the LAD and RCA. There is advanced atherosclerotic calcification of the thoracic aorta. No aneurysmal dilatation or dissection. The origins of the great vessels of the  aortic arch appear patent as visualized. The central pulmonary arteries appear patent. Evaluation of the pulmonary arteries is limited due to suboptimal opacification and timing of the contrast. Mediastinum/Nodes: There is a lobulated mass or enlarged abnormal lymph node in the mediastinum anterior to the right mainstem bronchus measuring 2.5 x 2.3 cm most consistent with metastatic disease. There is extension of this mass into the right hilum. The esophagus is grossly unremarkable. No mediastinal fluid collection. Lungs/Pleura: There is a lobulated right apical subpleural nodule measuring 2.8 x 2.1 cm extending inferiorly to the right suprahilar region consistent with malignancy. There is a 2.0 x 1.6 cm right suprahilar soft tissue nodule. There is a cluster of nodular density in the right upper lobe (54/4) extending to the right lateral pleural surface and along the right minor fissure most consistent with satellite nodules. There is no lobar consolidation, pleural effusion, pneumothorax. Mild background of emphysema. There is mild narrowing of the right upper lobe bronchus centrally secondary to mass effect and compression by the right hilar/mediastinal mass. The central airways however remain patent. Upper Abdomen: Subcentimeter left renal upper pole hypodense focus. The visualized upper abdomen is otherwise unremarkable. Musculoskeletal: There is osteopenia with degenerative changes of the spine. T9 hemangioma. No acute osseous pathology. IMPRESSION: 1. Right apical subpleural and right suprahilar masses consistent with malignancy. There is extension of this mass into the right hilum. 2. Right mediastinal soft tissue mass or abnormal lymph node with extension to the right hilum. Bronchoscopy may provide better evaluation. 3. A cluster of nodular density in the right upper lobe extending to the right lateral pleural surface and along the right minor fissure most consistent with satellite nodules. 4. Aortic  Atherosclerosis (ICD10-I70.0) and Emphysema (ICD10-J43.9). Electronically Signed   By: Anner Crete M.D.   On: 02/14/2020 20:57   CT Cervical Spine Wo Contrast  Result Date: 01/29/2020 CLINICAL DATA:  Status post fall. EXAM: CT CERVICAL SPINE WITHOUT CONTRAST TECHNIQUE: Multidetector CT imaging of the cervical spine was performed without intravenous contrast. Multiplanar CT image reconstructions were also generated. COMPARISON:  None. FINDINGS: Alignment: There is approximately 2 mm anterolisthesis of the C4 vertebral body on C5. Skull base and vertebrae: No acute fracture. No primary bone lesion or focal pathologic process. Soft tissues and spinal canal: No prevertebral fluid or swelling. No visible canal hematoma. Disc levels: Moderate to marked severity endplate sclerosis is seen at the levels of C5-C6 and C6-C7. Marked severity intervertebral disc space narrowing is seen at the level of C5-C6 with moderate severity intervertebral disc space narrowing  seen at the level of C6-C7. Multilevel moderate to marked severity bilateral facet joint hypertrophy is seen. Upper chest: A 2.7 cm x 2.1 cm x 2.5 cm low-attenuation soft tissue mass is seen within the medial aspect of the right apex. Other: None. IMPRESSION: 1. No acute fracture within the cervical spine. 2. Marked severity degenerative changes at the levels of C5-C6 and C6-C7. 3. A 2.7 cm x 2.1 cm x 2.5 cm low-attenuation right apical mass. This finding is concerning for the presence of a pulmonary neoplasm. Further evaluation with a nonemergent chest CT is recommended. Electronically Signed   By: Virgina Norfolk M.D.   On: 01/29/2020 23:59   CT PELVIS WO CONTRAST  Result Date: 02/07/2020 CLINICAL DATA:  Tailbone pain, status post fall EXAM: CT PELVIS WITHOUT CONTRAST TECHNIQUE: Multidetector CT imaging of the pelvis was performed following the standard protocol without intravenous contrast. COMPARISON:  None. FINDINGS: Urinary Tract: The visualized  distal ureters and bladder appear unremarkable. Bowel: No bowel wall thickening, distention or surrounding inflammation identified within the pelvis. Vascular/Lymphatic: No enlarged pelvic lymph nodes identified. Scattered aortic atherosclerotic calcifications are seen without aneurysmal dilatation. Reproductive: The patient is status post hysterectomy. No adnexal masses or collections seen. Other: No focal soft tissue swelling or soft tissue mass. Musculoskeletal: No acute or significant osseous findings. There is mild fatty atrophy of the muscles surrounding the pelvis. No large hip joint effusions. There is diffuse osteopenia. Moderate bilateral hip osteoarthritis is seen with joint space loss and marginal osteophyte formation. IMPRESSION: No acute fracture or dislocation. Moderate bilateral hip osteoarthritis. Aortic Atherosclerosis (ICD10-I70.0). Electronically Signed   By: Prudencio Pair M.D.   On: 02/07/2020 23:40   MR BRAIN W WO CONTRAST  Result Date: 02/15/2020 CLINICAL DATA:  Multiple recent falls, confusion, memory loss EXAM: MRI HEAD WITHOUT AND WITH CONTRAST TECHNIQUE: Multiplanar, multiecho pulse sequences of the brain and surrounding structures were obtained without and with intravenous contrast. CONTRAST:  37mL GADAVIST GADOBUTROL 1 MMOL/ML IV SOLN COMPARISON:  None. FINDINGS: Brain: There is no acute infarction or intracranial hemorrhage. There is no intracranial mass, mass effect, or edema. There is no hydrocephalus or extra-axial fluid collection. Prominence of the ventricles and sulci reflects parenchymal volume loss. 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 Additional patchy and confluent T2 hyperintensity in the supratentorial white matter is nonspecific but may reflect moderate to advanced chronic microvascular ischemic changes. Ex vacuo dilatation of the lateral ventricles. No abnormal enhancement. Vascular: Major vessel  flow voids at the skull base are preserved. Skull and upper cervical spine: Normal marrow signal is preserved. Sinuses/Orbits: Paranasal sinuses are aerated. Orbits are unremarkable. Other: Sella is unremarkable.  Mastoid air cells are clear. IMPRESSION: No evidence of recent infarction, hemorrhage, or mass. 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 microvascular ischemic changes. Electronically Signed   By: Macy Mis M.D.   On: 02/15/2020 16:13   MR THORACIC SPINE WO CONTRAST  Result Date: 02/14/2020 CLINICAL DATA:  Back pain with leg weakness and foot numbness. EXAM: MRI THORACIC AND LUMBAR SPINE WITHOUT CONTRAST TECHNIQUE: Multiplanar and multiecho pulse sequences of the thoracic and lumbar spine were obtained without intravenous contrast. COMPARISON:  Limited correlation made with chest radiographs 10/23/2017 and lumbar spine radiographs 01/30/2020. FINDINGS: MRI THORACIC SPINE FINDINGS Alignment: The alignment of the thoracic spine is normal. There are moderately advanced degenerative changes in the cervical spine associated with a mild anterolisthesis at C4-5. Vertebrae: No acute or  suspicious osseous findings. Multilevel cervical spondylosis with endplate degenerative changes. No evidence of osseous metastatic disease. Cord:  The thoracic cord appears normal in signal and caliber. Paraspinal and other soft tissues: There is an irregular mass medially in the right upper lobe, measuring 2.9 x 2.4 cm on image 9/22. There are associated enlarged right hilar and paratracheal lymph nodes, measuring up to 2.1 cm on image 14/22. These findings are worrisome for bronchogenic carcinoma, and further evaluation with chest CT recommended. No focal paraspinal abnormalities. Disc levels: The thoracic spinal canal is widely patent. There are minor thoracic spine degenerative changes, and no evidence of disc herniation, spinal stenosis or nerve root encroachment. As above, moderate cervical spondylosis  is present without gross cord deformity. MRI LUMBAR SPINE FINDINGS Segmentation: There are 5 lumbar type vertebral bodies. Alignment: Minimal convex right scoliosis. Minimal degenerative anterolisthesis at L4-5 and L5-S1. Vertebrae: No worrisome osseous lesion, acute fracture or pars defect. No evidence of osseous metastatic disease. The visualized sacroiliac joints appear unremarkable. Conus medullaris: Extends to the L1 level and appears normal. Paraspinal and other soft tissues: No significant paraspinal findings. Left renal cortical thinning and cysts are noted. Disc levels: L1-2: Mild disc bulging. No spinal stenosis or nerve root encroachment. L2-3: Normal interspace. L3-4: Mild disc bulging, facet and ligamentous hypertrophy. No significant spinal stenosis or nerve root encroachment. L4-5: Mild disc bulging with moderate facet and ligamentous hypertrophy. Mild spinal stenosis with mild narrowing of the lateral recesses and right foramen. No nerve root encroachment. L5-S1: Mild disc bulging and facet hypertrophy. No significant spinal stenosis or nerve root encroachment. IMPRESSION: 1. Irregular mass medially in the right upper lobe with associated right hilar and paratracheal adenopathy, highly worrisome for bronchogenic carcinoma. Further evaluation with chest CT with contrast recommended. 2. No acute findings or clear explanation for the patient's symptoms in the spine. No evidence of osseous metastatic disease. 3. Mild multifactorial spinal stenosis at L4-5 with mild narrowing of the lateral recesses and right foramen. 4. Mild disc bulging and facet hypertrophy at the other levels as described. No high-grade spinal stenosis or nerve root encroachment. 5. 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 Electronically Signed   By: Richardean Sale M.D.   On: 02/14/2020 18:21   MR LUMBAR SPINE WO CONTRAST  Result Date: 02/14/2020 CLINICAL DATA:  Back pain with leg weakness and foot numbness.  EXAM: MRI THORACIC AND LUMBAR SPINE WITHOUT CONTRAST TECHNIQUE: Multiplanar and multiecho pulse sequences of the thoracic and lumbar spine were obtained without intravenous contrast. COMPARISON:  Limited correlation made with chest radiographs 10/23/2017 and lumbar spine radiographs 01/30/2020. FINDINGS: MRI THORACIC SPINE FINDINGS Alignment: The alignment of the thoracic spine is normal. There are moderately advanced degenerative changes in the cervical spine associated with a mild anterolisthesis at C4-5. Vertebrae: No acute or suspicious osseous findings. Multilevel cervical spondylosis with endplate degenerative changes. No evidence of osseous metastatic disease. Cord:  The thoracic cord appears normal in signal and caliber. Paraspinal and other soft tissues: There is an irregular mass medially in the right upper lobe, measuring 2.9 x 2.4 cm on image 9/22. There are associated enlarged right hilar and paratracheal lymph nodes, measuring up to 2.1 cm on image 14/22. These findings are worrisome for bronchogenic carcinoma, and further evaluation with chest CT recommended. No focal paraspinal abnormalities. Disc levels: The thoracic spinal canal is widely patent. There are minor thoracic spine degenerative changes, and no evidence of disc herniation, spinal stenosis or nerve root encroachment. As above,  moderate cervical spondylosis is present without gross cord deformity. MRI LUMBAR SPINE FINDINGS Segmentation: There are 5 lumbar type vertebral bodies. Alignment: Minimal convex right scoliosis. Minimal degenerative anterolisthesis at L4-5 and L5-S1. Vertebrae: No worrisome osseous lesion, acute fracture or pars defect. No evidence of osseous metastatic disease. The visualized sacroiliac joints appear unremarkable. Conus medullaris: Extends to the L1 level and appears normal. Paraspinal and other soft tissues: No significant paraspinal findings. Left renal cortical thinning and cysts are noted. Disc levels: L1-2:  Mild disc bulging. No spinal stenosis or nerve root encroachment. L2-3: Normal interspace. L3-4: Mild disc bulging, facet and ligamentous hypertrophy. No significant spinal stenosis or nerve root encroachment. L4-5: Mild disc bulging with moderate facet and ligamentous hypertrophy. Mild spinal stenosis with mild narrowing of the lateral recesses and right foramen. No nerve root encroachment. L5-S1: Mild disc bulging and facet hypertrophy. No significant spinal stenosis or nerve root encroachment. IMPRESSION: 1. Irregular mass medially in the right upper lobe with associated right hilar and paratracheal adenopathy, highly worrisome for bronchogenic carcinoma. Further evaluation with chest CT with contrast recommended. 2. No acute findings or clear explanation for the patient's symptoms in the spine. No evidence of osseous metastatic disease. 3. Mild multifactorial spinal stenosis at L4-5 with mild narrowing of the lateral recesses and right foramen. 4. Mild disc bulging and facet hypertrophy at the other levels as described. No high-grade spinal stenosis or nerve root encroachment. 5. 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 Electronically Signed   By: Richardean Sale M.D.   On: 02/14/2020 18:21   DG CHEST PORT 1 VIEW  Result Date: 02/20/2020 CLINICAL DATA:  Wheezing EXAM: PORTABLE CHEST 1 VIEW COMPARISON:  02/18/2020 FINDINGS: Improved aeration at the right base. Continued mild atelectasis or infiltrate. Left lung clear. Heart is borderline in size. Aortic atherosclerosis. IMPRESSION: Improving right basilar opacity with mild residual right base atelectasis or infiltrate. Electronically Signed   By: Rolm Baptise M.D.   On: 02/20/2020 19:17   DG Chest Port 1 View  Result Date: 02/18/2020 CLINICAL DATA:  Status post bronchoscopy EXAM: PORTABLE CHEST 1 VIEW COMPARISON:  Chest CT February 14, 2020 FINDINGS: No pneumothorax. There is postoperative change on the right with volume loss. The  pulmonary nodular opacities seen along the medial right apex and right medial suprahilar regions are less evident than on recent CT. Ill-defined opacities in these areas is present. The left lung is clear. Heart size and pulmonary vascular normal. No adenopathy. Adenopathy evident on recent CT is not well appreciated by radiography. IMPRESSION: No pneumothorax. Ill-defined opacity right apex and right suprahilar regions medially present but better delineated on recent CT. Volume loss of postoperative change noted on the right. No new opacity evident. Left lung clear. Cardiac silhouette normal. Adenopathy seen recent CT not well seen by portable radiography examination. Electronically Signed   By: Lowella Grip III M.D.   On: 02/18/2020 15:07   DG C-ARM BRONCHOSCOPY  Result Date: 02/18/2020 C-ARM BRONCHOSCOPY: Fluoroscopy was utilized by the requesting physician.  No radiographic interpretation.      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  Patient does have a remote history of smoking. Details concerning her previous lung cancer pending at this time.  She reports no history of radiation or chemotherapy with this malignancy.  The patient will be scheduled for PET scan to complete her staging work-up.  The scan will be reviewed to determine if there would be other potential areas for  biopsy.  Per discussion this morning there would be other areas to potentially biopsy to obtain more tissue to access for targeted therapy.  Today I discussed the general techniques concerning radiation therapy particularly as it may relate to small cell lung cancer.  We discussed the general course of treatment side effects and potential toxicities.  PLAN: Final treatment details are pending results of PET scan and additional biopsies  ------------------------------------------------  Blair Promise, PhD, MD  This document serves as a record of services personally performed by Gery Pray, MD. It was created on his behalf by Clerance Lav, a trained medical scribe. The creation of this record is based on the scribe's personal observations and the provider's statements to them. This document has been checked and approved by the attending provider.

## 2020-02-27 NOTE — Progress Notes (Signed)
[name] [phone]   Fax:([phone] Multidisciplinary thoracic oncology clinic CONSULT NOTE  REFERRING PHYSICIAN: Dr. Baltazar Apo  REASON FOR CONSULTATION:  78 years old white female recently diagnosed with lung cancer.  HPI Leah Velasquez is a 79 y.o. female with past medical history significant for hypertension, dyslipidemia, rheumatoid arthritis, atrial fibrillation as well as obesity and history of early stage lung cancer status post right lower lobectomy in [place] in 2000.  The patient mentioned that she had several admission to the hospital over the last few months.  Most recently she has a fall at home when she was getting up to go to the bathroom and she presented to the emergency department for evaluation.  She had CT scan of the head and pelvis on 02/07/2020.  The scan of the head showed no acute intracranial abnormality.  CT scan of the pelvis showed no acute fracture or dislocation.  The patient also had MRI of the thoracolumbar spine by Dr. Sedonia Small on 02/14/2020 and it showed incidental finding of an irregular mass medially in the right upper lobe measuring 2.9 x 2.4 cm associated with enlarged right hilar and para tracheal lymph nodes measuring 2.1 cm.  These findings were worrisome for bronchogenic carcinoma.  There was no acute findings or clear explanation of the patient's symptoms in the spine and no evidence of osseous metastatic disease.  The patient had CT scan of the chest with contrast on 02/14/2020 and it showed a lobulated right apical subpleural nodule measuring 2.8 x 2.1 cm extending inferiorly to the right suprahilar region consistent with malignancy.  There is a 2.0 x 1.6 cm right suprahilar soft tissue nodule.  There is a cluster of nodular density in the right upper lobe extending to the right lateral pleural surface and along the right minor fissure most consistent with satellite nodules.  The scan also showed lobulated mass or enlarged  abnormal lymph node in the mediastinum adjacent to the right mainstem bronchus measuring 2.5 x 2.3 cm most consistent with metastatic disease.  There is extension of this mass into the right hilum. The patient was seen by Dr. Lamonte Sakai and on 02/18/2020 she underwent video bronchoscopy with electromagnetic navigation procedure.  The final pathology ([id number]) from the bronchial brushing of the right upper lobe showed neoplastic cells present. 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 It is difficult to determine on smears alone whether this represents a pure neuroendocrine tumor or a neoplasm such as adenocarcinoma with neuroendocrine differentiation. There is no accompanying cell block for further possible evaluation. Dr. Lamonte Sakai kindly referred the patient to the multidisciplinary thoracic oncology clinic today for evaluation and recommendation regarding her condition. When seen today the patient complains of lightheadedness as well as lack of appetite.  She gained a lot of weight over the last several months because she was on high-dose prednisone for pseudogout and rheumatoid arthritis.  She denied having any current chest pain, shortness of breath, cough or hemoptysis.  She denied having any nausea, vomiting but has occasional diarrhea with no constipation.  She denied having any current headache or visual changes. Family history significant for mother with a stroke.  Father has unknown medical history. The patient is married and has 4 children.  She was accompanied today by her Leah Velasquez.  Her daughter Leah Velasquez was also available by phone during the visit.  The patient used to work as a Art therapist.  She has a history of smoking  0.5 pack/day for around 15 years.  She quit in 2000 when she was diagnosed with lung cancer.  She has no history of alcohol or drug abuse. HPI  Past Medical History:  Diagnosis Date  . A-fib ([place])   . Hemorrhoids     . Hyperlipidemia   . Hypertension   . Lung cancer ([place]) 2000   RLL  . Obesity   . Persistent atrial fibrillation ([place])   . Rheumatoid arthritis [facility])     Past Surgical History:  Procedure Laterality Date  . BREAST EXCISIONAL BIOPSY Left    x 2  . BREAST EXCISIONAL BIOPSY Right   . BRONCHIAL BIOPSY  02/18/2020   Procedure: BRONCHIAL BIOPSIES;  Surgeon: Collene Gobble, MD;  Location: [facility] ENDOSCOPY;  Service: Cardiopulmonary;;  . BRONCHIAL BRUSHINGS  02/18/2020   Procedure: BRONCHIAL BRUSHINGS;  Surgeon: Collene Gobble, MD;  Location: [place];  Service: Cardiopulmonary;;  . BRONCHIAL NEEDLE ASPIRATION BIOPSY  02/18/2020   Procedure: BRONCHIAL NEEDLE ASPIRATION BIOPSIES;  Surgeon: Collene Gobble, MD;  Location: [place];  Service: Cardiopulmonary;;  . BRONCHIAL WASHINGS  02/18/2020   Procedure: BRONCHIAL WASHINGS;  Surgeon: Collene Gobble, MD;  Location: [place];  Service: Cardiopulmonary;;  . CARDIOVERSION N/A 01/09/2018   Procedure: CARDIOVERSION;  Surgeon: Josue Hector, MD;  Location: [facility] ENDOSCOPY;  Service: Cardiovascular;  Laterality: N/A;  . CARDIOVERSION N/A 03/01/2018   Procedure: CARDIOVERSION;  Surgeon: Sueanne Margarita, MD;  Location: [facility] ENDOSCOPY;  Service: Cardiovascular;  Laterality: N/A;  . ELECTROMAGNETIC NAVIGATION BROCHOSCOPY N/A 02/18/2020   Procedure: ELECTROMAGNETIC NAVIGATION BRONCHOSCOPY;  Surgeon: Collene Gobble, MD;  Location: [place];  Service: Cardiopulmonary;  Laterality: N/A;  . KNEE SURGERY    . LUNG LOBECTOMY Right 2000   RLL removed  . VIDEO BRONCHOSCOPY N/A 02/18/2020   Procedure: VIDEO BRONCHOSCOPY WITH FLUORO;  Surgeon: Collene Gobble, MD;  Location: [facility] ENDOSCOPY;  Service: Cardiopulmonary;  Laterality: N/A;    History reviewed. No pertinent family history.  Social History Social History   Tobacco Use  . Smoking status: Never Smoker  . Smokeless tobacco: Never Used  Vaping Use  . Vaping Use: Never used  Substance Use  Topics  . Alcohol use: No  . Drug use: No    Allergies  Allergen Reactions  . Morphine Anaphylaxis  . Morphine And Related Other (See Comments)    "vitals go down"  . Morphine And Related     Pass out   . Sulfa Antibiotics Rash  . Sulfa Antibiotics Rash  . Sulfa Antibiotics Rash    Current Outpatient Medications  Medication Sig Dispense Refill  . albuterol (VENTOLIN HFA) 108 (90 Base) MCG/ACT inhaler Inhale 1-2 puffs into the lungs every 6 (six) hours as needed for wheezing or shortness of breath.    . calcium carbonate (TUMS - DOSED IN MG ELEMENTAL CALCIUM) 500 MG chewable tablet Chew 1 tablet (200 mg of elemental calcium total) by mouth 3 (three) times daily as needed for indigestion or heartburn.    . Cholecalciferol (VITAMIN D3) 1000 units CAPS Take 1,000 Units by mouth daily.    Marland Kitchen conjugated estrogens (PREMARIN) vaginal cream Place 1 Applicatorful vaginally 2 (two) times a week.    . Esomeprazole Magnesium (NEXIUM PO) Take 1 capsule by mouth daily as needed (Indigestion).    . fluticasone (FLONASE) 50 MCG/ACT nasal spray Place 1 spray into both nostrils daily.     . furosemide (LASIX) 40 MG tablet Take 1 tablet (40 mg total)  by mouth daily. (Patient taking differently: Take 20 mg by mouth daily. ) 90 tablet 1  . hydrocortisone (ANUSOL-HC) 2.5 % rectal cream Apply rectally 2 times daily (Patient not taking: Reported on 02/14/2020) 28.35 g 1  . levocetirizine (XYZAL) 5 MG tablet Take 5 mg by mouth every evening.     . lidocaine (LIDODERM) 5 % Place 1 patch onto the skin daily. Remove & Discard patch within 12 hours or as directed by MD 30 patch 0  . losartan (COZAAR) 50 MG tablet TAKE 1 TABLET DAILY. PLEASE CALL TO SCHEDULE APPOINTMENT FOR FURTHER REFILLS. (Patient taking differently: Take 50 mg by mouth daily. ) 30 tablet 0  . metoprolol succinate (TOPROL XL) 25 MG 24 hr tablet Take 0.5 tablets (12.5 mg total) by mouth at bedtime. 45 tablet 3  . simvastatin (ZOCOR) 20 MG tablet Take  20 mg by mouth at bedtime.    . timolol (TIMOPTIC) 0.5 % ophthalmic solution Place 1 drop into both eyes in the morning and at bedtime.    . traMADol (ULTRAM) 50 MG tablet Take 50 mg by mouth daily as needed for moderate pain.   0  . vitamin B-12 1000 MCG tablet Take 1 tablet (1,000 mcg total) by mouth daily. 30 tablet 0  . warfarin (COUMADIN) 5 MG tablet Take 2.5-5 mg by mouth See admin instructions. Take 2.5mg  Sunday, Monday, Wednesday and Friday then take 5mg  all the other days    . zolpidem (AMBIEN) 5 MG tablet Take 2.5 mg by mouth at bedtime.     No current facility-administered medications for this visit.    Review of Systems  Constitutional: positive for anorexia and fatigue Eyes: negative Ears, nose, mouth, throat, and face: negative Respiratory: negative Cardiovascular: negative Gastrointestinal: negative Genitourinary:negative Integument/breast: negative Hematologic/lymphatic: negative Musculoskeletal:positive for arthralgias and muscle weakness Neurological: negative Behavioral/Psych: negative Endocrine: negative Allergic/Immunologic: negative  Physical Exam  GNO:IBBCW, healthy, no distress, well nourished, well developed and anxious SKIN: skin color, texture, turgor are normal, no rashes or significant lesions HEAD: Normocephalic, No masses, lesions, tenderness or abnormalities EYES: normal, PERRLA, Conjunctiva are pink and non-injected EARS: External ears normal, Canals clear OROPHARYNX:no exudate, no erythema and lips, buccal mucosa, and tongue normal  NECK: supple, no adenopathy, no JVD LYMPH:  no palpable lymphadenopathy, no hepatosplenomegaly BREAST:not examined LUNGS: clear to auscultation , and palpation HEART: regular rate & rhythm, no murmurs and no gallops ABDOMEN:abdomen soft, non-tender, obese, normal bowel sounds and no masses or organomegaly BACK: No CVA tenderness, Range of motion is normal EXTREMITIES:no joint deformities, effusion, or  inflammation, no edema  NEURO: alert & oriented x 3 with fluent speech, no focal motor/sensory deficits  PERFORMANCE STATUS: ECOG 1  LABORATORY DATA: Lab Results  Component Value Date   WBC 5.2 02/27/2020   HGB 14.4 02/27/2020   HCT 43.3 02/27/2020   MCV 95.2 02/27/2020   PLT 165 02/27/2020      Chemistry      Component Value Date/Time   NA 136 02/19/2020 0432   NA 138 10/02/2018 1545   K 4.3 02/19/2020 0432   CL 101 02/19/2020 0432   CO2 25 02/19/2020 0432   BUN 17 02/19/2020 0432   BUN 14 10/02/2018 1545   CREATININE 0.79 02/19/2020 0432      Component Value Date/Time   CALCIUM 8.9 02/19/2020 0432   ALKPHOS 68 02/19/2020 0432   AST 31 02/19/2020 0432   ALT 38 02/19/2020 0432   BILITOT 0.7 02/19/2020 0432  RADIOGRAPHIC STUDIES: DG Lumbar Spine Complete  Result Date: 01/30/2020 CLINICAL DATA:  Pain EXAM: LUMBAR SPINE - COMPLETE 4+ VIEW COMPARISON:  None. FINDINGS: Examination is significantly limited by patient body habitus. There is no definite acute displaced fracture. No definite malalignment. Multilevel degenerative changes are noted throughout the lumbar spine. Advanced vascular calcifications are noted. IMPRESSION: 1. Very limited study secondary to patient body habitus. 2. No definite acute displaced fracture identified on this study. Electronically Signed   By: Constance Holster M.D.   On: 01/30/2020 00:22   DG Sacrum/Coccyx  Result Date: 02/15/2020 CLINICAL DATA:  Fall with sacrococcygeal pain. EXAM: SACRUM AND COCCYX - 2+ VIEW COMPARISON:  None. FINDINGS: Mild diffuse decreased bone mineralization. No evidence of acute fracture. Mild degenerate change of the sacroiliac joints, spine and hips. Contrast is present within the bladder IMPRESSION: No acute fracture. Electronically Signed   By: Marin Olp M.D.   On: 02/15/2020 12:32   DG Shoulder Right  Result Date: 01/30/2020 CLINICAL DATA:  Pain EXAM: RIGHT SHOULDER - 2+ VIEW COMPARISON:  None. FINDINGS:  There is no acute displaced fracture or dislocation. Moderate degenerative changes are noted of the right glenohumeral joint. There are multiple old healed right-sided rib fractures. There are degenerative changes of the right AC joint. IMPRESSION: 1. No acute displaced fracture or dislocation. 2. Moderate degenerative changes of the right glenohumeral joint. Electronically Signed   By: Constance Holster M.D.   On: 01/30/2020 00:22   CT Head Wo Contrast  Result Date: 02/07/2020 CLINICAL DATA:  Fall getting up to go to the bathroom. On anticoagulation. Head trauma, minor (Age >= 65y) EXAM: CT HEAD WITHOUT CONTRAST TECHNIQUE: Contiguous axial images were obtained from the base of the skull through the vertex without intravenous contrast. COMPARISON:  Head CT 9 days ago 01/29/2020 FINDINGS: Brain: Generalized atrophy and chronic small vessel ischemia, stable from prior exam. Asymmetric sulcal prominence throughout the left hemisphere is unchanged. No acute hemorrhage or subdural collection. No midline shift or mass effect. 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 No evidence of acute ischemia. Vascular: Atherosclerosis of skullbase vasculature without hyperdense vessel or abnormal calcification. Skull: No fracture or focal lesion. Tiny cortical outpouching in the right frontal bone is unchanged, benign. Sinuses/Orbits: Minor mucosal thickening of ethmoid air cells. Bilateral cataract resection. No acute findings. Other: None. IMPRESSION: 1. No acute intracranial abnormality. No skull fracture. 2. Stable atrophy and chronic small vessel ischemia. Electronically Signed   By: Keith Rake M.D.   On: 02/07/2020 23:37   CT Head Wo Contrast  Result Date: 01/29/2020 CLINICAL DATA:  Status post fall. EXAM: CT HEAD WITHOUT CONTRAST TECHNIQUE: Contiguous axial images were obtained from the base of the skull through the vertex without intravenous contrast. COMPARISON:  None. FINDINGS: Brain: There is moderate  severity cerebral atrophy with widening of the extra-axial spaces and ventricular dilatation. Asymmetric sulcal prominence is noted throughout the left hemisphere. There are areas of decreased attenuation within the white matter tracts of the supratentorial brain, consistent with microvascular disease changes. 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 Vascular: No hyperdense vessel or unexpected calcification. Skull: Normal. Negative for fracture or focal lesion. Sinuses/Orbits: No acute finding. Other: None. IMPRESSION: 1. Generalized cerebral atrophy. 2. 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 3. No acute intracranial abnormality. Electronically Signed   By: Virgina Norfolk M.D.   On: 01/29/2020 23:51   CT CHEST W CONTRAST  Result Date: 02/14/2020 CLINICAL DATA:  79 year old female with with  generalized weakness and not being able to stand. EXAM: CT CHEST WITH CONTRAST TECHNIQUE: Multidetector CT imaging of the chest was performed during intravenous contrast administration. CONTRAST:  50mL OMNIPAQUE IOHEXOL 300 MG/ML  SOLN COMPARISON:  Chest radiograph dated 10/23/2017. FINDINGS: Evaluation of this exam is limited in the absence of intravenous contrast. Cardiovascular: Borderline cardiomegaly with mild dilatation of the left atrium. There is coronary vascular calcification primarily involving the LAD and RCA. There is advanced atherosclerotic calcification of the thoracic aorta. No aneurysmal dilatation or dissection. The origins of the great vessels of the aortic arch appear patent as visualized. The central pulmonary arteries appear patent. Evaluation of the pulmonary arteries is limited due to suboptimal opacification and timing of the contrast. Mediastinum/Nodes: There is a lobulated mass or enlarged abnormal lymph node in the mediastinum anterior to the right mainstem bronchus measuring 2.5 x 2.3 cm most consistent with metastatic disease. There is extension of  this mass into the right hilum. The esophagus is grossly unremarkable. No mediastinal fluid collection. Lungs/Pleura: There is a lobulated right apical subpleural nodule measuring 2.8 x 2.1 cm extending inferiorly to the right suprahilar region consistent with malignancy. There is a 2.0 x 1.6 cm right suprahilar soft tissue nodule. There is a cluster of nodular density in the right upper lobe (54/4) extending to the right lateral pleural surface and along the right minor fissure most consistent with satellite nodules. There is no lobar consolidation, pleural effusion, pneumothorax. Mild background of emphysema. There is mild narrowing of the right upper lobe bronchus centrally secondary to mass effect and compression by the right hilar/mediastinal mass. The central airways however remain patent. Upper Abdomen: Subcentimeter left renal upper pole hypodense focus. The visualized upper abdomen is otherwise unremarkable. Musculoskeletal: There is osteopenia with degenerative changes of the spine. T9 hemangioma. No acute osseous pathology. IMPRESSION: 1. Right apical subpleural and right suprahilar masses consistent with malignancy. There is extension of this mass into the right hilum. 2. Right mediastinal soft tissue mass or abnormal lymph node with extension to the right hilum. Bronchoscopy may provide better evaluation. 3. A cluster of nodular density in the right upper lobe extending to the right lateral pleural surface and along the right minor fissure most consistent with satellite nodules. 4. Aortic Atherosclerosis (ICD10-I70.0) and Emphysema (ICD10-J43.9). Electronically Signed   By: Anner Crete M.D.   On: 02/14/2020 20:57   CT Cervical Spine Wo Contrast  Result Date: 01/29/2020 CLINICAL DATA:  Status post fall. EXAM: CT CERVICAL SPINE WITHOUT CONTRAST TECHNIQUE: Multidetector CT imaging of the cervical spine was performed without intravenous contrast. Multiplanar CT image reconstructions were also  generated. COMPARISON:  None. FINDINGS: Alignment: There is approximately 2 mm anterolisthesis of the C4 vertebral body on C5. Skull base and vertebrae: No acute fracture. No primary bone lesion or focal pathologic process. Soft tissues and spinal canal: No prevertebral fluid or swelling. No visible canal hematoma. Disc levels: Moderate to marked severity endplate sclerosis is seen at the levels of C5-C6 and C6-C7. Marked severity intervertebral disc space narrowing is seen at the level of C5-C6 with moderate severity intervertebral disc space narrowing seen at the level of C6-C7. Multilevel moderate to marked severity bilateral facet joint hypertrophy is seen. Upper chest: A 2.7 cm x 2.1 cm x 2.5 cm low-attenuation soft tissue mass is seen within the medial aspect of the right apex. Other: None. IMPRESSION: 1. No acute fracture within the cervical spine. 2. Marked severity degenerative changes at the levels of C5-C6 and C6-C7.  3. A 2.7 cm x 2.1 cm x 2.5 cm low-attenuation right apical mass. This finding is concerning for the presence of a pulmonary neoplasm. Further evaluation with a nonemergent chest CT is recommended. Electronically Signed   By: Virgina Norfolk M.D.   On: 01/29/2020 23:59   CT PELVIS WO CONTRAST  Result Date: 02/07/2020 CLINICAL DATA:  Tailbone pain, status post fall EXAM: CT PELVIS WITHOUT CONTRAST TECHNIQUE: Multidetector CT imaging of the pelvis was performed following the standard protocol without intravenous contrast. COMPARISON:  None. FINDINGS: Urinary Tract: The visualized distal ureters and bladder appear unremarkable. Bowel: No bowel wall thickening, distention or surrounding inflammation identified within the pelvis. Vascular/Lymphatic: No enlarged pelvic lymph nodes identified. Scattered aortic atherosclerotic calcifications are seen without aneurysmal dilatation. Reproductive: The patient is status post hysterectomy. No adnexal masses or collections seen. Other: No focal soft  tissue swelling or soft tissue mass. Musculoskeletal: No acute or significant osseous findings. There is mild fatty atrophy of the muscles surrounding the pelvis. No large hip joint effusions. There is diffuse osteopenia. Moderate bilateral hip osteoarthritis is seen with joint space loss and marginal osteophyte formation. IMPRESSION: No acute fracture or dislocation. Moderate bilateral hip osteoarthritis. Aortic Atherosclerosis (ICD10-I70.0). Electronically Signed   By: Prudencio Pair M.D.   On: 02/07/2020 23:40   MR BRAIN W WO CONTRAST  Result Date: 02/15/2020 CLINICAL DATA:  Multiple recent falls, confusion, memory loss EXAM: MRI HEAD WITHOUT AND WITH CONTRAST TECHNIQUE: Multiplanar, multiecho pulse sequences of the brain and surrounding structures were obtained without and with intravenous contrast. CONTRAST:  12mL GADAVIST GADOBUTROL 1 MMOL/ML IV SOLN COMPARISON:  None. FINDINGS: Brain: There is no acute infarction or intracranial hemorrhage. There is no intracranial mass, mass effect, or edema. There is no hydrocephalus or extra-axial fluid collection. Prominence of the ventricles and sulci reflects parenchymal volume loss. 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 Additional patchy and confluent T2 hyperintensity in the supratentorial white matter is nonspecific but may reflect moderate to advanced chronic microvascular ischemic changes. Ex vacuo dilatation of the lateral ventricles. No abnormal enhancement. Vascular: Major vessel flow voids at the skull base are preserved. Skull and upper cervical spine: Normal marrow signal is preserved. Sinuses/Orbits: Paranasal sinuses are aerated. Orbits are unremarkable. Other: Sella is unremarkable.  Mastoid air cells are clear. IMPRESSION: No evidence of recent infarction, hemorrhage, or mass. 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 microvascular ischemic changes. Electronically Signed    By: Macy Mis M.D.   On: 02/15/2020 16:13   MR THORACIC SPINE WO CONTRAST  Result Date: 02/14/2020 CLINICAL DATA:  Back pain with leg weakness and foot numbness. EXAM: MRI THORACIC AND LUMBAR SPINE WITHOUT CONTRAST TECHNIQUE: Multiplanar and multiecho pulse sequences of the thoracic and lumbar spine were obtained without intravenous contrast. COMPARISON:  Limited correlation made with chest radiographs 10/23/2017 and lumbar spine radiographs 01/30/2020. FINDINGS: MRI THORACIC SPINE FINDINGS Alignment: The alignment of the thoracic spine is normal. There are moderately advanced degenerative changes in the cervical spine associated with a mild anterolisthesis at C4-5. Vertebrae: No acute or suspicious osseous findings. Multilevel cervical spondylosis with endplate degenerative changes. No evidence of osseous metastatic disease. Cord:  The thoracic cord appears normal in signal and caliber. Paraspinal and other soft tissues: There is an irregular mass medially in the right upper lobe, measuring 2.9 x 2.4 cm on image 9/22. There are associated enlarged right hilar and paratracheal lymph nodes, measuring up to 2.1  cm on image 14/22. These findings are worrisome for bronchogenic carcinoma, and further evaluation with chest CT recommended. No focal paraspinal abnormalities. Disc levels: The thoracic spinal canal is widely patent. There are minor thoracic spine degenerative changes, and no evidence of disc herniation, spinal stenosis or nerve root encroachment. As above, moderate cervical spondylosis is present without gross cord deformity. MRI LUMBAR SPINE FINDINGS Segmentation: There are 5 lumbar type vertebral bodies. Alignment: Minimal convex right scoliosis. Minimal degenerative anterolisthesis at L4-5 and L5-S1. Vertebrae: No worrisome osseous lesion, acute fracture or pars defect. No evidence of osseous metastatic disease. The visualized sacroiliac joints appear unremarkable. Conus medullaris: Extends to  the L1 level and appears normal. Paraspinal and other soft tissues: No significant paraspinal findings. Left renal cortical thinning and cysts are noted. Disc levels: L1-2: Mild disc bulging. No spinal stenosis or nerve root encroachment. L2-3: Normal interspace. L3-4: Mild disc bulging, facet and ligamentous hypertrophy. No significant spinal stenosis or nerve root encroachment. L4-5: Mild disc bulging with moderate facet and ligamentous hypertrophy. Mild spinal stenosis with mild narrowing of the lateral recesses and right foramen. No nerve root encroachment. L5-S1: Mild disc bulging and facet hypertrophy. No significant spinal stenosis or nerve root encroachment. IMPRESSION: 1. Irregular mass medially in the right upper lobe with associated right hilar and paratracheal adenopathy, highly worrisome for bronchogenic carcinoma. Further evaluation with chest CT with contrast recommended. 2. No acute findings or clear explanation for the patient's symptoms in the spine. No evidence of osseous metastatic disease. 3. Mild multifactorial spinal stenosis at L4-5 with mild narrowing of the lateral recesses and right foramen. 4. Mild disc bulging and facet hypertrophy at the other levels as described. No high-grade spinal stenosis or nerve root encroachment. 5. 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 Electronically Signed   By: Richardean Sale M.D.   On: 02/14/2020 18:21   MR LUMBAR SPINE WO CONTRAST  Result Date: 02/14/2020 CLINICAL DATA:  Back pain with leg weakness and foot numbness. EXAM: MRI THORACIC AND LUMBAR SPINE WITHOUT CONTRAST TECHNIQUE: Multiplanar and multiecho pulse sequences of the thoracic and lumbar spine were obtained without intravenous contrast. COMPARISON:  Limited correlation made with chest radiographs 10/23/2017 and lumbar spine radiographs 01/30/2020. FINDINGS: MRI THORACIC SPINE FINDINGS Alignment: The alignment of the thoracic spine is normal. There are moderately  advanced degenerative changes in the cervical spine associated with a mild anterolisthesis at C4-5. Vertebrae: No acute or suspicious osseous findings. Multilevel cervical spondylosis with endplate degenerative changes. No evidence of osseous metastatic disease. Cord:  The thoracic cord appears normal in signal and caliber. Paraspinal and other soft tissues: There is an irregular mass medially in the right upper lobe, measuring 2.9 x 2.4 cm on image 9/22. There are associated enlarged right hilar and paratracheal lymph nodes, measuring up to 2.1 cm on image 14/22. These findings are worrisome for bronchogenic carcinoma, and further evaluation with chest CT recommended. No focal paraspinal abnormalities. Disc levels: The thoracic spinal canal is widely patent. There are minor thoracic spine degenerative changes, and no evidence of disc herniation, spinal stenosis or nerve root encroachment. As above, moderate cervical spondylosis is present without gross cord deformity. MRI LUMBAR SPINE FINDINGS Segmentation: There are 5 lumbar type vertebral bodies. Alignment: Minimal convex right scoliosis. Minimal degenerative anterolisthesis at L4-5 and L5-S1. Vertebrae: No worrisome osseous lesion, acute fracture or pars defect. No evidence of osseous metastatic disease. The visualized sacroiliac joints appear unremarkable. Conus medullaris: Extends to the L1 level and appears normal.  Paraspinal and other soft tissues: No significant paraspinal findings. Left renal cortical thinning and cysts are noted. Disc levels: L1-2: Mild disc bulging. No spinal stenosis or nerve root encroachment. L2-3: Normal interspace. L3-4: Mild disc bulging, facet and ligamentous hypertrophy. No significant spinal stenosis or nerve root encroachment. L4-5: Mild disc bulging with moderate facet and ligamentous hypertrophy. Mild spinal stenosis with mild narrowing of the lateral recesses and right foramen. No nerve root encroachment. L5-S1: Mild disc  bulging and facet hypertrophy. No significant spinal stenosis or nerve root encroachment. IMPRESSION: 1. Irregular mass medially in the right upper lobe with associated right hilar and paratracheal adenopathy, highly worrisome for bronchogenic carcinoma. Further evaluation with chest CT with contrast recommended. 2. No acute findings or clear explanation for the patient's symptoms in the spine. No evidence of osseous metastatic disease. 3. Mild multifactorial spinal stenosis at L4-5 with mild narrowing of the lateral recesses and right foramen. 4. Mild disc bulging and facet hypertrophy at the other levels as described. No high-grade spinal stenosis or nerve root encroachment. 5. 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 Electronically Signed   By: Richardean Sale M.D.   On: 02/14/2020 18:21   DG CHEST PORT 1 VIEW  Result Date: 02/20/2020 CLINICAL DATA:  Wheezing EXAM: PORTABLE CHEST 1 VIEW COMPARISON:  02/18/2020 FINDINGS: Improved aeration at the right base. Continued mild atelectasis or infiltrate. Left lung clear. Heart is borderline in size. Aortic atherosclerosis. IMPRESSION: Improving right basilar opacity with mild residual right base atelectasis or infiltrate. Electronically Signed   By: Rolm Baptise M.D.   On: 02/20/2020 19:17   DG Chest Port 1 View  Result Date: 02/18/2020 CLINICAL DATA:  Status post bronchoscopy EXAM: PORTABLE CHEST 1 VIEW COMPARISON:  Chest CT February 14, 2020 FINDINGS: No pneumothorax. There is postoperative change on the right with volume loss. The pulmonary nodular opacities seen along the medial right apex and right medial suprahilar regions are less evident than on recent CT. Ill-defined opacities in these areas is present. The left lung is clear. Heart size and pulmonary vascular normal. No adenopathy. Adenopathy evident on recent CT is not well appreciated by radiography. IMPRESSION: No pneumothorax. Ill-defined opacity right apex and right suprahilar  regions medially present but better delineated on recent CT. Volume loss of postoperative change noted on the right. No new opacity evident. Left lung clear. Cardiac silhouette normal. Adenopathy seen recent CT not well seen by portable radiography examination. Electronically Signed   By: Lowella Grip III M.D.   On: 02/18/2020 15:07   DG C-ARM BRONCHOSCOPY  Result Date: 02/18/2020 C-ARM BRONCHOSCOPY: Fluoroscopy was utilized by the requesting physician.  No radiographic interpretation.    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   PLAN: I had a lengthy discussion with the patient and her family today about her current disease stage, prognosis and treatment options. Unfortunately the final pathology is not clear if she has small cell lung cancer or non-small cell lung cancer. We will need another tissue biopsy for further confirmation of her malignancy. I recommended for the patient to have a PET scan for restaging of her disease and to rule out any other metastatic areas. The patient will also need MRI of the brain to rule out any metastatic disease to the  brain. I will refer the patient to interventional radiology for consideration of CT-guided core biopsy of the right upper lobe lung mass to have more tissue for confirmation of her pathology. I will arrange for the patient to come back for follow-up visit in 2 weeks for reevaluation and more detailed discussion of her treatment options. If the patient has no evidence of metastatic disease and the final pathology is consistent with non-small cell lung cancer, she would benefit from a course of concurrent chemoradiation with weekly carboplatin and paclitaxel followed by consolidation immunotherapy  if she has no evidence of progression. If the final pathology is consistent with small cell lung cancer, the patient may benefit from treatment with systemic chemotherapy with either cisplatin and etoposide or carboplatin and etoposide concurrent with radiation. The patient and her family agreed to the current plan. I will see her back for follow-up visit in 2 weeks for evaluation. She was seen today by Dr. Sondra Come from radiation oncology for discussion of the radiotherapy option. The patient was advised to call immediately if she has any concerning symptoms in the interval.  The patient voices understanding of current disease status and treatment options and is in agreement with the current care plan.  All questions were answered. The patient knows to call the clinic with any problems, questions or concerns. We can certainly see the patient much sooner if necessary.  Thank you so much for allowing me to participate in the care of Leah Velasquez. I will continue to follow up the patient with you and assist in her care.  The total time spent in the appointment was 80 minutes.  Disclaimer: This note was dictated with voice recognition software. Similar sounding words can inadvertently be transcribed and may not be corrected upon review.   Eilleen Kempf February 27, 2020, 3:30 PM

## 2020-02-27 NOTE — Progress Notes (Signed)
The proposed treatment discussed in cancer conference 02/27/20 is for discussion purpose only and is not a binding recommendation.  The patient was not physically examined nor present for their treatment options.  Therefore, final treatment plans cannot be decided.

## 2020-02-28 ENCOUNTER — Telehealth: Payer: Self-pay | Admitting: Internal Medicine

## 2020-02-28 NOTE — Telephone Encounter (Signed)
Spoke to patients husband who informed me that patient is currently in Rehab, asked him to let patient know I am trying to locate a report on surgery she had done in [place], husband advised he would have patient to call me back

## 2020-03-04 ENCOUNTER — Telehealth: Payer: Self-pay | Admitting: Medical Oncology

## 2020-03-04 NOTE — Telephone Encounter (Signed)
7/9 per Hassel, resubmit after PET 7/9 ok to hold Coumadin 7/9 Pending Review Coumadin (Warfarin)( 4 days) hold until after NM PET  Pt notified.

## 2020-03-04 NOTE — Telephone Encounter (Signed)
CT biopsy not scheduled. I called ir and LVM to return my call. Pt asking about it.

## 2020-03-04 NOTE — Telephone Encounter (Signed)
Okay to hold the Coumadin and proceed with the biopsy.

## 2020-03-06 ENCOUNTER — Encounter: Payer: Self-pay | Admitting: *Deleted

## 2020-03-06 NOTE — Progress Notes (Signed)
I received a message regarding Leah Velasquez's bx but I am unclear of what is needed. I reached out to Dr. Vernard Gambles for clarification.

## 2020-03-09 ENCOUNTER — Telehealth: Payer: Self-pay | Admitting: Internal Medicine

## 2020-03-09 NOTE — Telephone Encounter (Signed)
Called and confirmed 7/21 appt with patients husband

## 2020-03-10 ENCOUNTER — Ambulatory Visit (HOSPITAL_COMMUNITY)
Admission: RE | Admit: 2020-03-10 | Discharge: 2020-03-10 | Disposition: A | Discharge status: Home / Self Care | Payer: Medicare HMO | Source: Ambulatory Visit | Attending: Internal Medicine | Admitting: Internal Medicine

## 2020-03-10 ENCOUNTER — Other Ambulatory Visit: Payer: Self-pay

## 2020-03-10 DIAGNOSIS — C3490 Malignant neoplasm of unspecified part of unspecified bronchus or lung: Secondary | ICD-10-CM

## 2020-03-10 LAB — GLUCOSE, CAPILLARY: Glucose-Capillary: 121 mg/dL — ABNORMAL HIGH (ref 70–99)

## 2020-03-10 IMAGING — PT NM PET TUM IMG INITIAL (PI) SKULL BASE T - THIGH
1 of 8 series · 1 of 25 positions shown · non-contrast
Comparison: CT chest from [DATE]

CLINICAL DATA: Initial treatment strategy for non-small cell lung
cancer.

EXAM:
NUCLEAR MEDICINE PET SKULL BASE TO THIGH
TECHNIQUE: 11.57 mCi F-18 FDG was injected intravenously. Full-ring PET imaging
was performed from the skull base to thigh after the radiotracer. CT
data was obtained and used for attenuation correction and anatomic
localization.
Fasting blood glucose: 121 mg/dl

[Series 4: ct sk_thigh 5.0 b31f · axial · 5.0mm · 0.98mm/px · 1 of 223 slices shown]
[im 223/223  brain]
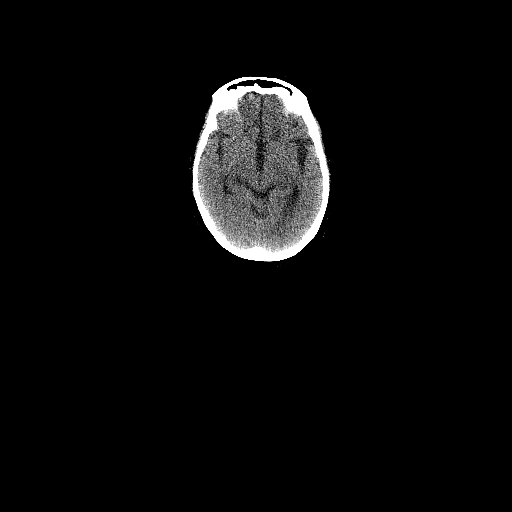

[1 of 25 positions shown; findings below may reference images not displayed]

FINDINGS: Mediastinal blood pool activity: SUV max

Liver activity: SUV max NA

NECK: No hypermetabolic lymph nodes in the neck.

Incidental CT findings: none

CHEST: Right paratracheal lymph node measures 2.1 x 2.0 cm with SUV
max of 15.98.

Right hilar lymph node is FDG avid measuring approximately 1.5 cm
and has an SUV max of 11.27. No FDG avid lymph nodes within the left
side of mediastinum or left hilar region.

There is a subpleural mass within the anteromedial right lung apex
which invades the superior mediastinum measuring 3.0 x 2.0 cm, image
46/4. The SUV max is equal to 24.2.

Incidental CT findings: Calcified granuloma is identified within the
posterior right lung base, image 45/8. Mild cardiac enlargement.
Aortic atherosclerosis. Lad and RCA coronary artery atherosclerotic
calcifications identified.

ABDOMEN/PELVIS: No abnormal FDG uptake within the liver, pancreas,
spleen, or adrenal glands. FDG avid lymph node within the right
lower quadrant mesentery measures 0.8 cm and has an SUV max of 7.9.

Incidental CT findings: Nonobstructing stone identified within
inferior pole of right kidney. Aortic atherosclerosis without
aneurysm. Calcification within the posterior right bladder measures
4 mm.

SKELETON: No focal hypermetabolic activity to suggest skeletal
metastasis.

Incidental CT findings: Focal area of increased tracer uptake
localizing to the sacrococcygeal junction. The SUV max is equal to
5.08. Given the absence of additional foci of increased uptake this
is favored to represent arthro pathic changes. No aggressive lytic
or sclerotic bone lesions identified.
IMPRESSION: 1. FDG avid lesions within the paramediastinal aspect of the right
apex and right upper lobe concerning for primary bronchogenic
carcinoma. Signs of hypermetabolic right hilar and right
paratracheal nodal metastasis.
2. Single subcentimeter lymph node within the right lower quadrant
small bowel mesentery is FDG avid within SUV max of 7.9. This is of
uncertain clinical significance and would be an unusual focus of
metastasis given absence of disease elsewhere in the abdomen and
pelvis.
3. Small focus of moderate uptake localizing to the sacrococcygeal
junction is favored to represent arthropathic changes. No additional
foci of increased tracer activity identified within the imaged
portions of the axial and appendicular skeleton.
4. Aortic Atherosclerosis ([CJ]-[CJ]). Coronary artery
calcifications.

## 2020-03-10 MED ORDER — FLUDEOXYGLUCOSE F - 18 (FDG) INJECTION
11.5700 | Freq: Once | INTRAVENOUS | Status: AC | PRN
  Administered 2020-03-10: 11.57 via INTRAVENOUS

## 2020-03-11 ENCOUNTER — Other Ambulatory Visit: Payer: Self-pay

## 2020-03-11 ENCOUNTER — Inpatient Hospital Stay: Payer: Medicare HMO | Admitting: Internal Medicine

## 2020-03-11 ENCOUNTER — Other Ambulatory Visit: Payer: Self-pay | Admitting: Medical Oncology

## 2020-03-11 ENCOUNTER — Emergency Department (HOSPITAL_COMMUNITY)
Admission: EM | Admit: 2020-03-11 | Discharge: 2020-03-12 | Disposition: A | Discharge status: Home / Self Care | Payer: Medicare HMO | Attending: Emergency Medicine | Admitting: Emergency Medicine

## 2020-03-11 ENCOUNTER — Encounter: Payer: Self-pay | Admitting: Internal Medicine

## 2020-03-11 ENCOUNTER — Emergency Department (HOSPITAL_COMMUNITY): Payer: Medicare HMO

## 2020-03-11 ENCOUNTER — Encounter (HOSPITAL_COMMUNITY): Payer: Self-pay | Admitting: Radiology

## 2020-03-11 ENCOUNTER — Encounter (HOSPITAL_COMMUNITY): Payer: Self-pay | Admitting: *Deleted

## 2020-03-11 ENCOUNTER — Telehealth: Payer: Self-pay | Admitting: Medical Oncology

## 2020-03-11 VITALS — BP 105/69 | HR 83 | Temp 97.5°F | Resp 17 | Ht 61.0 in | Wt 231.5 lb

## 2020-03-11 DIAGNOSIS — Z85118 Personal history of other malignant neoplasm of bronchus and lung: Secondary | ICD-10-CM | POA: Insufficient documentation

## 2020-03-11 DIAGNOSIS — W19XXXA Unspecified fall, initial encounter: Secondary | ICD-10-CM | POA: Diagnosis not present

## 2020-03-11 DIAGNOSIS — C3491 Malignant neoplasm of unspecified part of right bronchus or lung: Secondary | ICD-10-CM | POA: Diagnosis not present

## 2020-03-11 DIAGNOSIS — R531 Weakness: Secondary | ICD-10-CM | POA: Diagnosis present

## 2020-03-11 DIAGNOSIS — C3490 Malignant neoplasm of unspecified part of unspecified bronchus or lung: Secondary | ICD-10-CM

## 2020-03-11 DIAGNOSIS — Z79899 Other long term (current) drug therapy: Secondary | ICD-10-CM | POA: Diagnosis not present

## 2020-03-11 DIAGNOSIS — I10 Essential (primary) hypertension: Secondary | ICD-10-CM | POA: Insufficient documentation

## 2020-03-11 LAB — PROTIME-INR
INR: 1.8 — ABNORMAL HIGH (ref 0.8–1.2)
Prothrombin Time: 19.9 seconds — ABNORMAL HIGH (ref 11.4–15.2)

## 2020-03-11 LAB — COMPREHENSIVE METABOLIC PANEL
ALT: 25 U/L (ref 0–44)
AST: 27 U/L (ref 15–41)
Albumin: 2.9 g/dL — ABNORMAL LOW (ref 3.5–5.0)
Alkaline Phosphatase: 81 U/L (ref 38–126)
Anion gap: 10 (ref 5–15)
BUN: 11 mg/dL (ref 8–23)
CO2: 27 mmol/L (ref 22–32)
Calcium: 8.9 mg/dL (ref 8.9–10.3)
Chloride: 99 mmol/L (ref 98–111)
Creatinine, Ser: 1.05 mg/dL — ABNORMAL HIGH (ref 0.44–1.00)
GFR calc Af Amer: 59 mL/min — ABNORMAL LOW (ref >60)
GFR calc non Af Amer: 51 mL/min — ABNORMAL LOW (ref >60)
Glucose, Bld: 116 mg/dL — ABNORMAL HIGH (ref 70–99)
Potassium: 3 mmol/L — ABNORMAL LOW (ref 3.5–5.1)
Sodium: 136 mmol/L (ref 135–145)
Total Bilirubin: 1.2 mg/dL (ref 0.3–1.2)
Total Protein: 6 g/dL — ABNORMAL LOW (ref 6.5–8.1)

## 2020-03-11 LAB — CBC WITH DIFFERENTIAL/PLATELET
Abs Immature Granulocytes: 0.04 10*3/uL (ref 0.00–0.07)
Basophils Absolute: 0.1 10*3/uL (ref 0.0–0.1)
Basophils Relative: 1 %
Eosinophils Absolute: 0.1 10*3/uL (ref 0.0–0.5)
Eosinophils Relative: 1 %
HCT: 42.2 % (ref 36.0–46.0)
Hemoglobin: 13.5 g/dL (ref 12.0–15.0)
Immature Granulocytes: 1 %
Lymphocytes Relative: 15 %
Lymphs Abs: 1.1 10*3/uL (ref 0.7–4.0)
MCH: 30.5 pg (ref 26.0–34.0)
MCHC: 32 g/dL (ref 30.0–36.0)
MCV: 95.5 fL (ref 80.0–100.0)
Monocytes Absolute: 1 10*3/uL (ref 0.1–1.0)
Monocytes Relative: 13 %
Neutro Abs: 5.1 10*3/uL (ref 1.7–7.7)
Neutrophils Relative %: 69 %
Platelets: 172 10*3/uL (ref 150–400)
RBC: 4.42 MIL/uL (ref 3.87–5.11)
RDW: 14.2 % (ref 11.5–15.5)
WBC: 7.3 10*3/uL (ref 4.0–10.5)
nRBC: 0 % (ref 0.0–0.2)

## 2020-03-11 IMAGING — DX DG KNEE COMPLETE 4+V*R*
4 series · 4 of 4 positions shown · non-contrast
Comparison: [DATE]

CLINICAL DATA: Fall, right knee pain

EXAM:
RIGHT KNEE - COMPLETE 4+ VIEW

[knee ap]
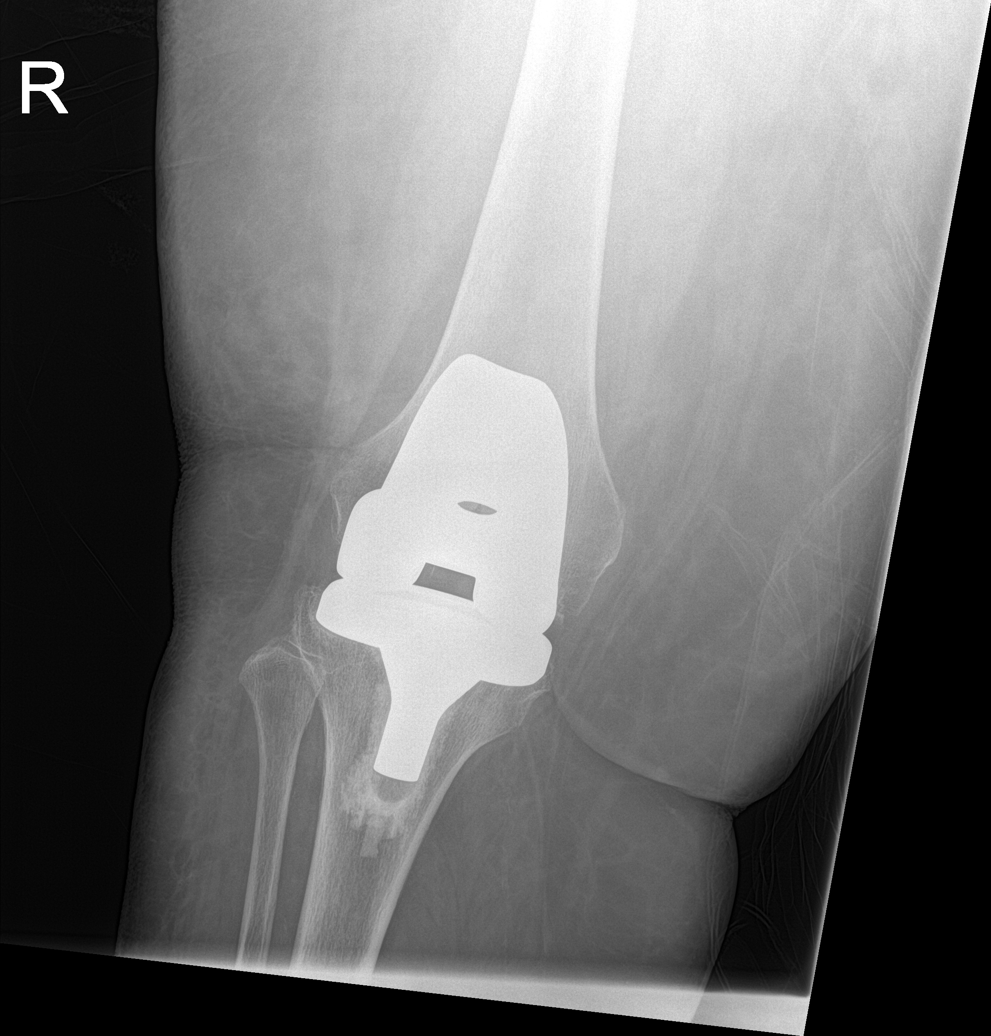

[knee lat]
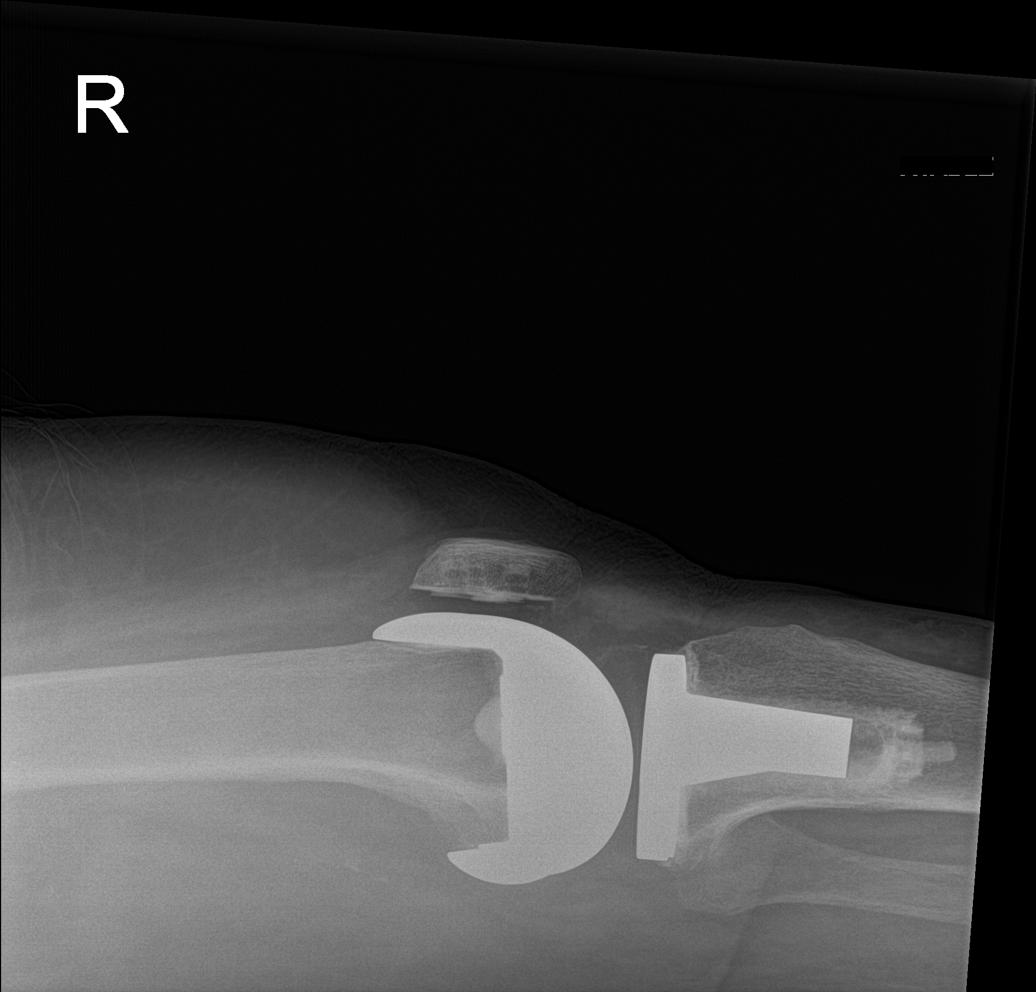

[knee obl (1 of 2)]
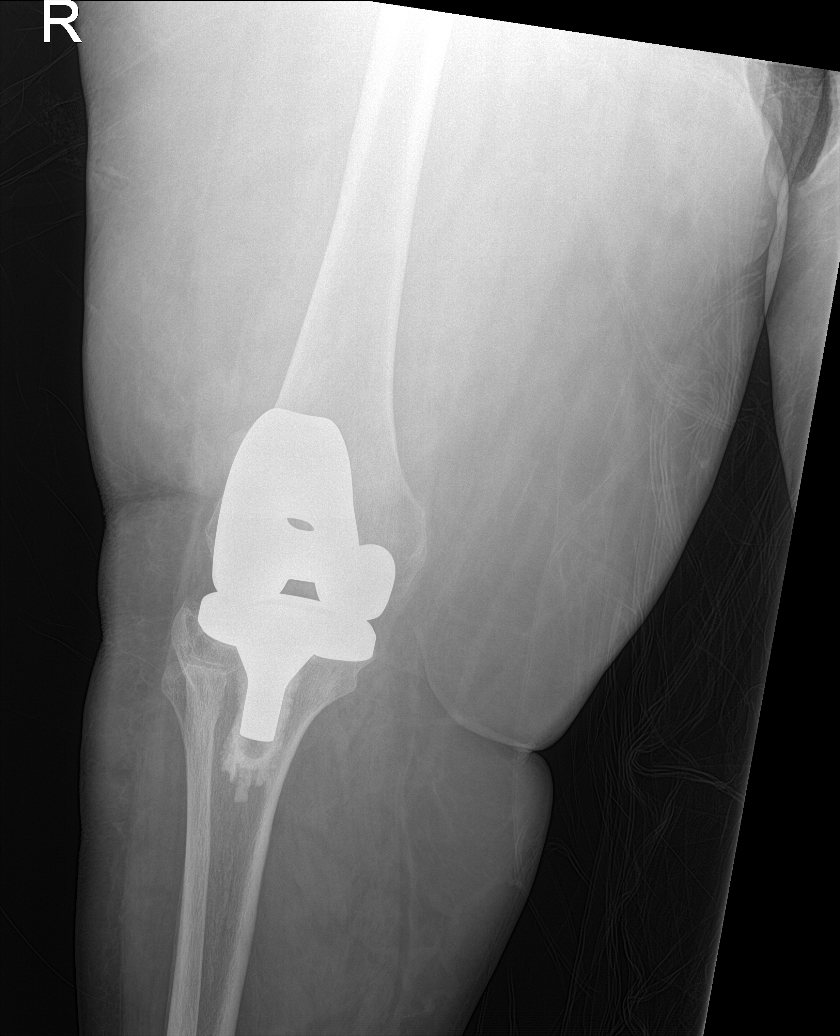

[knee obl (2 of 2)]
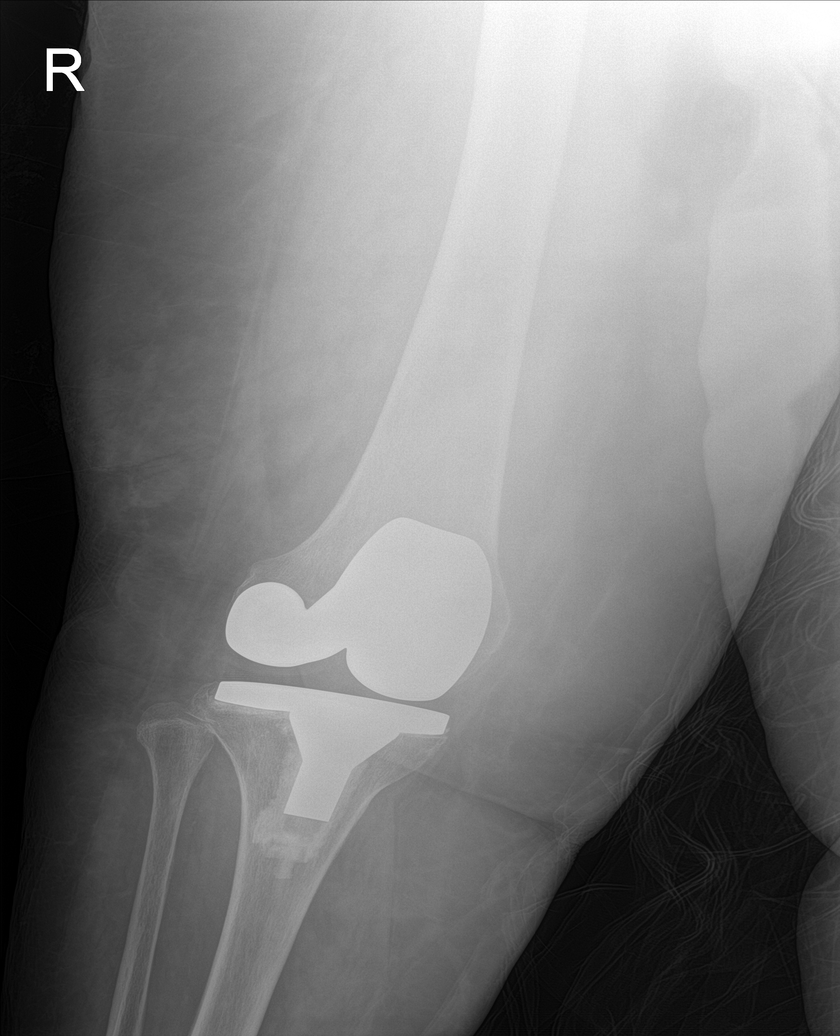

[4 of 4 positions shown; findings below may reference images not displayed]

FINDINGS: Two view radiograph right knee demonstrates surgical changes of
right total knee arthroplasty. Arthroplasty components are in
expected alignment. No fracture or dislocation. No effusion. Soft
tissues are unremarkable. Is
IMPRESSION: Negative.

## 2020-03-11 MED ORDER — LACTATED RINGERS IV BOLUS
500.0000 mL | Freq: Once | INTRAVENOUS | Status: AC
  Administered 2020-03-11: 500 mL via INTRAVENOUS

## 2020-03-11 NOTE — ED Triage Notes (Signed)
The pt fell going to the br she has had multiply falls in the  Past  She lives at home with her husband  The pt reports thatshe has not had any food or drink all day.  She had numerus  Appointments today  She was just diagnosed with lung cancer

## 2020-03-11 NOTE — Progress Notes (Unsigned)
Leah Velasquez. Leah Velasquez Female, 79 y.o., 1941-08-12 MRN:  [id number] Phone:  [phone] (H) ... PCP:  [name], MD Coverage:  [employer] Hmo  RE: CT Biopsy Received: 1 week ago Curt Bears, MD  Garth Bigness D yes       Previous Messages   ----- Message -----  From: Garth Bigness D  Sent: 02/28/2020  2:52 PM EDT  To: Curt Bears, MD  Subject: FW: CT Biopsy                   Patient is on Coumadin (Warfarin), need to know if okay to hold for 4 days prior to biopsy.  Thanks [name]  ----- Message -----  From: Garth Bigness D  Sent: 02/28/2020  2:47 PM EDT  To: [facility] Procedure Requests  Subject: CT Biopsy                     Procedure:  CT Biopsy   Reason: Malignant neoplasm of unspecified part of unspecified bronchus or lung, CT biopsy of right upper lobe lung mass   History:  CT, MR in computer....Marland KitchenMarland KitchenNM PET scheduled for 03/10/20   Provider: Curt Bears   Provider Contact: [phone]

## 2020-03-11 NOTE — ED Provider Notes (Signed)
[facility] EMERGENCY DEPARTMENT Provider Note   CSN: [id number] Arrival date & time: 03/11/20  2032     History Chief Complaint  Patient presents with  . Fall    Leah Velasquez is a 79 y.o. female.  HPI Patient presents after weakness and a fall. Rather recently diagnosed with lung cancer. Has been getting more weak at home. Haddoctors appointments today and really do not eat any food. States that she went to the bathroom and felt as if she is in a pass out. She was able to make it to her bed but before help would come in the room she slid off the bed. States she does have some mild pain in the right knee. No chest pain. No trouble breathing. States she does feel fatigued. Did not hit her head. She is on anticoagulation for atrial fibrillation. Reviewing records it appears as if they're attempting to set up home health but she has not had it yet and they want another office visit to get a face-to-face visit. No fevers. No dysuria.    Past Medical History:  Diagnosis Date  . A-fib ([place])   . Hemorrhoids   . Hyperlipidemia   . Hypertension   . Lung cancer ([place]) 2000   RLL  . Obesity   . Persistent atrial fibrillation ([place])   . Rheumatoid arthritis [facility])     Patient Active Problem List   Diagnosis Date Noted  . Non-small cell carcinoma of right lung, stage 3 ([place]) 02/27/2020  . Paresthesias 02/15/2020  . Frequent falls   . Lung mass 02/14/2020  . Long term (current) use of anticoagulants 06/04/2018  . CAP (community acquired pneumonia) 10/22/2017  . Acute respiratory failure with hypoxia ([place]) 10/22/2017  . Atrial fibrillation ([place]) 10/22/2017  . Generalized weakness 10/22/2017  . Essential hypertension 10/22/2017  . Thrombocytopenia ([place]) 10/22/2017  . Hyperlipidemia 10/22/2017    Past Surgical History:  Procedure Laterality Date  . BREAST EXCISIONAL BIOPSY Left    x 2  . BREAST EXCISIONAL BIOPSY Right   . BRONCHIAL BIOPSY  02/18/2020   Procedure:  BRONCHIAL BIOPSIES;  Surgeon: Collene Gobble, MD;  Location: [facility] ENDOSCOPY;  Service: Cardiopulmonary;;  . BRONCHIAL BRUSHINGS  02/18/2020   Procedure: BRONCHIAL BRUSHINGS;  Surgeon: Collene Gobble, MD;  Location: [place];  Service: Cardiopulmonary;;  . BRONCHIAL NEEDLE ASPIRATION BIOPSY  02/18/2020   Procedure: BRONCHIAL NEEDLE ASPIRATION BIOPSIES;  Surgeon: Collene Gobble, MD;  Location: [place];  Service: Cardiopulmonary;;  . BRONCHIAL WASHINGS  02/18/2020   Procedure: BRONCHIAL WASHINGS;  Surgeon: Collene Gobble, MD;  Location: [place];  Service: Cardiopulmonary;;  . CARDIOVERSION N/A 01/09/2018   Procedure: CARDIOVERSION;  Surgeon: Josue Hector, MD;  Location: [facility] ENDOSCOPY;  Service: Cardiovascular;  Laterality: N/A;  . CARDIOVERSION N/A 03/01/2018   Procedure: CARDIOVERSION;  Surgeon: Sueanne Margarita, MD;  Location: [facility] ENDOSCOPY;  Service: Cardiovascular;  Laterality: N/A;  . ELECTROMAGNETIC NAVIGATION BROCHOSCOPY N/A 02/18/2020   Procedure: ELECTROMAGNETIC NAVIGATION BRONCHOSCOPY;  Surgeon: Collene Gobble, MD;  Location: [place];  Service: Cardiopulmonary;  Laterality: N/A;  . KNEE SURGERY    . LUNG LOBECTOMY Right 2000   RLL removed  . VIDEO BRONCHOSCOPY N/A 02/18/2020   Procedure: VIDEO BRONCHOSCOPY WITH FLUORO;  Surgeon: Collene Gobble, MD;  Location: [facility] ENDOSCOPY;  Service: Cardiopulmonary;  Laterality: N/A;     OB History   No obstetric history on file.     No family history on file.  Social History   Tobacco Use  . Smoking status: Never Smoker  . Smokeless tobacco: Never Used  Vaping Use  . Vaping Use: Never used  Substance Use Topics  . Alcohol use: No  . Drug use: No    Home Medications Prior to Admission medications   Medication Sig Start Date End Date Taking? Authorizing Provider  albuterol (VENTOLIN HFA) 108 (90 Base) MCG/ACT inhaler Inhale 1-2 puffs into the lungs every 6 (six) hours as needed for wheezing or shortness of  breath. Patient not taking: Reported on 02/27/2020    [provider]  calcium carbonate (TUMS - DOSED IN MG ELEMENTAL CALCIUM) 500 MG chewable tablet Chew 1 tablet (200 mg of elemental calcium total) by mouth 3 (three) times daily as needed for indigestion or heartburn. 10/26/17   Doreatha Lew, MD  Cholecalciferol (VITAMIN D3) 1000 units CAPS Take 1,000 Units by mouth daily.    [provider]  conjugated estrogens (PREMARIN) vaginal cream Place 1 Applicatorful vaginally 2 (two) times a week.    [provider]  Esomeprazole Magnesium (NEXIUM PO) Take 1 capsule by mouth daily as needed (Indigestion).    [provider]  fluticasone (FLONASE) 50 MCG/ACT nasal spray Place 1 spray into both nostrils daily.  08/30/17   [provider]  furosemide (LASIX) 40 MG tablet Take 1 tablet (40 mg total) by mouth daily. Patient taking differently: Take 20 mg by mouth daily.  06/10/19   Lorretta Harp, MD  hydrocortisone (ANUSOL-HC) 2.5 % rectal cream Apply rectally 2 times daily 03/06/18   Veryl Speak, MD  levocetirizine (XYZAL) 5 MG tablet Take 5 mg by mouth every evening.  09/22/17   [provider]  lidocaine (LIDODERM) 5 % Place 1 patch onto the skin daily. Remove & Discard patch within 12 hours or as directed by MD 02/08/20   Caccavale, Sophia, PA-C  losartan (COZAAR) 50 MG tablet TAKE 1 TABLET DAILY. PLEASE CALL TO SCHEDULE APPOINTMENT FOR FURTHER REFILLS. Patient taking differently: Take 50 mg by mouth daily.  12/31/19   Lorretta Harp, MD  metoprolol succinate (TOPROL XL) 25 MG 24 hr tablet Take 0.5 tablets (12.5 mg total) by mouth at bedtime. 01/01/19 02/14/20  Lorretta Harp, MD  simvastatin (ZOCOR) 20 MG tablet Take 20 mg by mouth at bedtime. 07/17/17   [provider]  timolol (TIMOPTIC) 0.5 % ophthalmic solution Place 1 drop into both eyes in the morning and at bedtime. 11/03/19   [provider]  traMADol (ULTRAM) 50 MG  tablet Take 50 mg by mouth daily as needed for moderate pain.  10/03/17   [provider]  vitamin B-12 1000 MCG tablet Take 1 tablet (1,000 mcg total) by mouth daily. 02/20/20   Andrew Au, MD  warfarin (COUMADIN) 5 MG tablet Take 2.5-5 mg by mouth See admin instructions. Take 2.5mg  Sunday, Monday, Wednesday and Friday then take 5mg  all the other days    [provider]  zolpidem (AMBIEN) 5 MG tablet Take 2.5 mg by mouth at bedtime.    [provider]    Allergies    Morphine, Morphine and related, Morphine and related, Sulfa antibiotics, Sulfa antibiotics, and Sulfa antibiotics  Review of Systems   Review of Systems  Constitutional: Positive for appetite change and fatigue. Negative for fever.  HENT: Negative for congestion.   Respiratory: Negative for shortness of breath.   Cardiovascular: Negative for chest pain.  Gastrointestinal: Negative for abdominal distention.  Musculoskeletal: Negative  for back pain.       Right knee pain.  Neurological: Negative for weakness.  Psychiatric/Behavioral: Negative for confusion.    Physical Exam Updated Vital Signs BP 107/66   Pulse 73   Temp (!) 97.4 F (36.3 C) (Oral)   Resp 16   Ht 5\' 1"  (1.549 m)   Wt 105 kg   SpO2 93%   BMI 43.74 kg/m   Physical Exam Vitals and nursing note reviewed.  HENT:     Head: Normocephalic and atraumatic.  Eyes:     Extraocular Movements: Extraocular movements intact.     Pupils: Pupils are equal, round, and reactive to light.  Cardiovascular:     Rate and Rhythm: Regular rhythm.  Abdominal:     Tenderness: There is no abdominal tenderness.  Musculoskeletal:     Cervical back: Neck supple.     Comments: Mild pain in her right knee.  Scar from previous knee replacement.  Knee overall stable with mild movement with varus and valgus strain.  Skin:    General: Skin is warm.     Capillary Refill: Capillary refill takes less than 2 seconds.  Neurological:     Mental  Status: She is alert and oriented to person, place, and time.     ED Results / Procedures / Treatments   Labs (all labs ordered are listed, but only abnormal results are displayed) Labs Reviewed  URINALYSIS, ROUTINE W REFLEX MICROSCOPIC  COMPREHENSIVE METABOLIC PANEL  CBC WITH DIFFERENTIAL/PLATELET  PROTIME-INR    EKG None  Radiology NM PET Image Initial (PI) Skull Base To Thigh  Result Date: 03/11/2020 CLINICAL DATA:  Initial treatment strategy for non-small cell lung cancer. EXAM: NUCLEAR MEDICINE PET SKULL BASE TO THIGH TECHNIQUE: 11.57 mCi F-18 FDG was injected intravenously. Full-ring PET imaging was performed from the skull base to thigh after the radiotracer. CT data was obtained and used for attenuation correction and anatomic localization. Fasting blood glucose: 121 mg/dl COMPARISON:  CT chest from 02/14/2020 FINDINGS: Mediastinal blood pool activity: SUV max 4.18 Liver activity: SUV max NA NECK: No hypermetabolic lymph nodes in the neck. Incidental CT findings: none CHEST: Right paratracheal lymph node measures 2.1 x 2.0 cm with SUV max of 15.98. Right hilar lymph node is FDG avid measuring approximately 1.5 cm and has an SUV max of 11.27. No FDG avid lymph nodes within the left side of mediastinum or left hilar region. There is a subpleural mass within the anteromedial right lung apex which invades the superior mediastinum measuring 3.0 x 2.0 cm, image 46/4. The SUV max is equal to 24.2. Incidental CT findings: Calcified granuloma is identified within the posterior right lung base, image 45/8. Mild cardiac enlargement. Aortic atherosclerosis. Lad and RCA coronary artery atherosclerotic calcifications identified. ABDOMEN/PELVIS: No abnormal FDG uptake within the liver, pancreas, spleen, or adrenal glands. FDG avid lymph node within the right lower quadrant mesentery measures 0.8 cm and has an SUV max of 7.9. Incidental CT findings: Nonobstructing stone identified within inferior pole of  right kidney. Aortic atherosclerosis without aneurysm. Calcification within the posterior right bladder measures 4 mm. SKELETON: No focal hypermetabolic activity to suggest skeletal metastasis. Incidental CT findings: Focal area of increased tracer uptake localizing to the sacrococcygeal junction. The SUV max is equal to 5.08. Given the absence of additional foci of increased uptake this is favored to represent arthro pathic changes. No aggressive lytic or sclerotic bone lesions identified. IMPRESSION: 1. FDG avid lesions within the paramediastinal aspect of the right apex  and right upper lobe concerning for primary bronchogenic carcinoma. 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 2. Single subcentimeter lymph node within the right lower quadrant small bowel mesentery is FDG avid within SUV max of 7.9. This is of uncertain clinical significance and would be an unusual focus of metastasis given absence of disease elsewhere in the abdomen and pelvis. 3. Small focus of moderate uptake localizing to the sacrococcygeal junction is favored to represent arthropathic changes. No additional foci of increased tracer activity identified within the imaged portions of the axial and appendicular skeleton. 4. Aortic Atherosclerosis (ICD10-I70.0). Coronary artery calcifications. Electronically Signed   By: Kerby Moors M.D.   On: 03/11/2020 09:12   DG Knee Complete 4 Views Right  Result Date: 03/11/2020 CLINICAL DATA:  Fall, right knee pain EXAM: RIGHT KNEE - COMPLETE 4+ VIEW COMPARISON:  10/21/2017 FINDINGS: Two view radiograph right knee demonstrates surgical changes of right total knee arthroplasty. Arthroplasty components are in expected alignment. No fracture or dislocation. No effusion. Soft tissues are unremarkable. Is IMPRESSION: Negative. Electronically Signed   By: Fidela Salisbury MD   On: 03/11/2020 21:30    Procedures Procedures (including critical care time)  Medications  Ordered in ED Medications  lactated ringers bolus 500 mL (500 mLs Intravenous New Bag/Given 03/11/20 2239)    ED Course  I have reviewed the triage vital signs and the nursing notes.  Pertinent labs & imaging results that were available during my care of the patient were reviewed by me and considered in my medical decision making (see chart for details).    MDM Rules/Calculators/A&P                          Patient with generalized weakness.  Has been weak for a while likely due to her lung cancer.  Had been not eating today due to doctors appointments and had a near syncopal episode while getting off the toilet.  Fell.  Do not see a clear injury but had right knee pain.  X-ray reassuring. Home health was attempting to be arranged as an outpatient.  Mariann Laster here has seen the patient.  Will attempt to arrange.  Face-to-face done here.  If patient is discharged home patient can get home health tomorrow.  Care will be turned over to oncoming provider  Final Clinical Impression(s) / ED Diagnoses Final diagnoses:  Fall, initial encounter  General weakness    Rx / DC Orders ED Discharge Orders         [place]  Reprint     03/11/20 2206    Face-to-face encounter (required for Medicare/Medicaid patients)     Discontinue  Reprint    Comments: I Davonna Belling certify that this patient is under my care and that I, or a nurse practitioner or physician's assistant working with me, had a face-to-face encounter that meets the physician face-to-face encounter requirements with this patient on 03/11/2020. The encounter with the patient was in whole, or in part for the following medical condition(s) which is the primary reason for home health care (List medical condition): Lung cancer   03/11/20 2206           Davonna Belling, MD 03/11/20 2307

## 2020-03-11 NOTE — ED Notes (Signed)
Pt sleeping. 

## 2020-03-11 NOTE — Progress Notes (Signed)
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  PRIOR THERAPY: None  CURRENT THERAPY: None  INTERVAL HISTORY: Leah Velasquez 79 y.o. female returns to the clinic today for follow-up visit accompanied by her husband and her daughter Siri Cole was available by phone during the visit.  The patient is feeling fine today with no concerning complaints except for the persistent fatigue as well as shortness of breath at baseline increased with exertion and mild cough.  She also has intermittent sternal chest pain.  She denied having any hemoptysis.  She denied having any nausea, vomiting, diarrhea or constipation.  She has no headache or visual changes.  She had a PET scan performed recently and she is here for evaluation and discussion of her PET scan results and recommendation regarding treatment of her condition.  MEDICAL HISTORY: Past Medical History:  Diagnosis Date  . A-fib ([place])   . Hemorrhoids   . Hyperlipidemia   . Hypertension   . Lung cancer ([place]) 2000   RLL  . Obesity   . Persistent atrial fibrillation ([place])   . Rheumatoid arthritis ([facility])     ALLERGIES:  is allergic to morphine, morphine and related, morphine and related, sulfa antibiotics, sulfa antibiotics, and sulfa antibiotics.  MEDICATIONS:  Current Outpatient Medications  Medication Sig Dispense Refill  . albuterol (VENTOLIN HFA) 108 (90 Base) MCG/ACT inhaler Inhale 1-2 puffs into the lungs every 6 (six) hours as needed for wheezing or shortness of breath. (Patient not taking: Reported on 02/27/2020)    . calcium carbonate (TUMS - DOSED IN MG ELEMENTAL CALCIUM) 500 MG chewable tablet  Chew 1 tablet (200 mg of elemental calcium total) by mouth 3 (three) times daily as needed for indigestion or heartburn.    . Cholecalciferol (VITAMIN D3) 1000 units CAPS Take 1,000 Units by mouth daily.    Marland Kitchen conjugated estrogens (PREMARIN) vaginal cream Place 1 Applicatorful vaginally 2 (two) times a week.    . Esomeprazole Magnesium (NEXIUM PO) Take 1 capsule by mouth daily as needed (Indigestion).    . fluticasone (FLONASE) 50 MCG/ACT nasal spray Place 1 spray into both nostrils daily.     . furosemide (LASIX) 40 MG tablet Take 1 tablet (40 mg total) by mouth daily. (Patient taking differently: Take 20 mg by mouth daily. ) 90 tablet 1  . hydrocortisone (ANUSOL-HC) 2.5 % rectal cream Apply rectally 2 times daily 28.35 g 1  . levocetirizine (XYZAL) 5 MG tablet Take 5 mg by mouth every evening.     . lidocaine (LIDODERM) 5 % Place 1 patch onto the skin daily. Remove & Discard patch within 12 hours or as directed by MD 30 patch 0  . losartan (COZAAR) 50 MG tablet TAKE 1 TABLET DAILY. PLEASE CALL TO SCHEDULE APPOINTMENT FOR FURTHER REFILLS. (Patient taking differently: Take 50 mg by mouth daily. ) 30 tablet 0  . metoprolol succinate (TOPROL XL) 25 MG 24 hr tablet Take 0.5 tablets (12.5 mg total) by mouth at bedtime. 45 tablet 3  . simvastatin (ZOCOR) 20 MG tablet Take 20 mg by mouth at bedtime.    . timolol (TIMOPTIC) 0.5 % ophthalmic solution Place 1 drop into both eyes in the  morning and at bedtime.    . traMADol (ULTRAM) 50 MG tablet Take 50 mg by mouth daily as needed for moderate pain.   0  . vitamin B-12 1000 MCG tablet Take 1 tablet (1,000 mcg total) by mouth daily. 30 tablet 0  . warfarin (COUMADIN) 5 MG tablet Take 2.5-5 mg by mouth See admin instructions. Take 2.5mg  Sunday, Monday, Wednesday and Friday then take 5mg  all the other days    . zolpidem (AMBIEN) 5 MG tablet Take 2.5 mg by mouth at bedtime.     No current facility-administered medications for this visit.    SURGICAL HISTORY:    Past Surgical History:  Procedure Laterality Date  . BREAST EXCISIONAL BIOPSY Left    x 2  . BREAST EXCISIONAL BIOPSY Right   . BRONCHIAL BIOPSY  02/18/2020   Procedure: BRONCHIAL BIOPSIES;  Surgeon: Collene Gobble, MD;  Location: [facility] ENDOSCOPY;  Service: Cardiopulmonary;;  . BRONCHIAL BRUSHINGS  02/18/2020   Procedure: BRONCHIAL BRUSHINGS;  Surgeon: Collene Gobble, MD;  Location: [place];  Service: Cardiopulmonary;;  . BRONCHIAL NEEDLE ASPIRATION BIOPSY  02/18/2020   Procedure: BRONCHIAL NEEDLE ASPIRATION BIOPSIES;  Surgeon: Collene Gobble, MD;  Location: [place];  Service: Cardiopulmonary;;  . BRONCHIAL WASHINGS  02/18/2020   Procedure: BRONCHIAL WASHINGS;  Surgeon: Collene Gobble, MD;  Location: [place];  Service: Cardiopulmonary;;  . CARDIOVERSION N/A 01/09/2018   Procedure: CARDIOVERSION;  Surgeon: Josue Hector, MD;  Location: [facility] ENDOSCOPY;  Service: Cardiovascular;  Laterality: N/A;  . CARDIOVERSION N/A 03/01/2018   Procedure: CARDIOVERSION;  Surgeon: Sueanne Margarita, MD;  Location: [facility] ENDOSCOPY;  Service: Cardiovascular;  Laterality: N/A;  . ELECTROMAGNETIC NAVIGATION BROCHOSCOPY N/A 02/18/2020   Procedure: ELECTROMAGNETIC NAVIGATION BRONCHOSCOPY;  Surgeon: Collene Gobble, MD;  Location: [place];  Service: Cardiopulmonary;  Laterality: N/A;  . KNEE SURGERY    . LUNG LOBECTOMY Right 2000   RLL removed  . VIDEO BRONCHOSCOPY N/A 02/18/2020   Procedure: VIDEO BRONCHOSCOPY WITH FLUORO;  Surgeon: Collene Gobble, MD;  Location: [facility] ENDOSCOPY;  Service: Cardiopulmonary;  Laterality: N/A;    REVIEW OF SYSTEMS:  Constitutional: positive for fatigue Eyes: negative Ears, nose, mouth, throat, and face: negative Respiratory: positive for cough, dyspnea on exertion and pleurisy/chest pain Cardiovascular: negative Gastrointestinal: negative Genitourinary:negative Integument/breast: negative Hematologic/lymphatic: negative Musculoskeletal:negative Neurological:  negative Behavioral/Psych: negative Endocrine: negative Allergic/Immunologic: negative   PHYSICAL EXAMINATION: General appearance: alert, cooperative, fatigued and no distress Head: Normocephalic, without obvious abnormality, atraumatic Neck: no adenopathy, no JVD, supple, symmetrical, trachea midline and thyroid not enlarged, symmetric, no tenderness/mass/nodules Lymph nodes: Cervical, supraclavicular, and axillary nodes normal. Resp: clear to auscultation bilaterally Back: symmetric, no curvature. ROM normal. No CVA tenderness. Cardio: regular rate and rhythm, S1, S2 normal, no murmur, click, rub or gallop GI: soft, non-tender; bowel sounds normal; no masses,  no organomegaly Extremities: extremities normal, atraumatic, no cyanosis or edema Neurologic: Alert and oriented X 3, normal strength and tone. Normal symmetric reflexes. Normal coordination and gait  ECOG PERFORMANCE STATUS: 1 - Symptomatic but completely ambulatory  Blood pressure 105/69, pulse 83, temperature (!) 97.5 F (36.4 C), temperature source Temporal, resp. rate 17, height 5\' 1"  (1.549 m), weight 231 lb 8 oz (105 kg), SpO2 96 %.  LABORATORY DATA: Lab Results  Component Value Date   WBC 5.2 02/27/2020   HGB 14.4 02/27/2020   HCT 43.3 02/27/2020   MCV 95.2 02/27/2020   PLT 165 02/27/2020      Chemistry  Component Value Date/Time   NA 136 02/19/2020 0432   NA 138 10/02/2018 1545   K 4.3 02/19/2020 0432   CL 101 02/19/2020 0432   CO2 25 02/19/2020 0432   BUN 17 02/19/2020 0432   BUN 14 10/02/2018 1545   CREATININE 0.79 02/19/2020 0432      Component Value Date/Time   CALCIUM 8.9 02/19/2020 0432   ALKPHOS 68 02/19/2020 0432   AST 31 02/19/2020 0432   ALT 38 02/19/2020 0432   BILITOT 0.7 02/19/2020 0432       RADIOGRAPHIC STUDIES: DG Sacrum/Coccyx  Result Date: 02/15/2020 CLINICAL DATA:  Fall with sacrococcygeal pain. EXAM: SACRUM AND COCCYX - 2+ VIEW COMPARISON:  None. FINDINGS: Mild diffuse  decreased bone mineralization. No evidence of acute fracture. Mild degenerate change of the sacroiliac joints, spine and hips. Contrast is present within the bladder IMPRESSION: No acute fracture. Electronically Signed   By: Marin Olp M.D.   On: 02/15/2020 12:32   CT CHEST W CONTRAST  Result Date: 02/14/2020 CLINICAL DATA:  79 year old female with with generalized weakness and not being able to stand. EXAM: CT CHEST WITH CONTRAST TECHNIQUE: Multidetector CT imaging of the chest was performed during intravenous contrast administration. CONTRAST:  37mL OMNIPAQUE IOHEXOL 300 MG/ML  SOLN COMPARISON:  Chest radiograph dated 10/23/2017. FINDINGS: Evaluation of this exam is limited in the absence of intravenous contrast. Cardiovascular: Borderline cardiomegaly with mild dilatation of the left atrium. There is coronary vascular calcification primarily involving the LAD and RCA. There is advanced atherosclerotic calcification of the thoracic aorta. No aneurysmal dilatation or dissection. The origins of the great vessels of the aortic arch appear patent as visualized. The central pulmonary arteries appear patent. Evaluation of the pulmonary arteries is limited due to suboptimal opacification and timing of the contrast. Mediastinum/Nodes: There is a lobulated mass or enlarged abnormal lymph node in the mediastinum anterior to the right mainstem bronchus measuring 2.5 x 2.3 cm most consistent with metastatic disease. There is extension of this mass into the right hilum. The esophagus is grossly unremarkable. No mediastinal fluid collection. Lungs/Pleura: There is a lobulated right apical subpleural nodule measuring 2.8 x 2.1 cm extending inferiorly to the right suprahilar region consistent with malignancy. There is a 2.0 x 1.6 cm right suprahilar soft tissue nodule. There is a cluster of nodular density in the right upper lobe (54/4) extending to the right lateral pleural surface and along the right minor fissure most  consistent with satellite nodules. There is no lobar consolidation, pleural effusion, pneumothorax. Mild background of emphysema. There is mild narrowing of the right upper lobe bronchus centrally secondary to mass effect and compression by the right hilar/mediastinal mass. The central airways however remain patent. Upper Abdomen: Subcentimeter left renal upper pole hypodense focus. The visualized upper abdomen is otherwise unremarkable. Musculoskeletal: There is osteopenia with degenerative changes of the spine. T9 hemangioma. No acute osseous pathology. IMPRESSION: 1. Right apical subpleural and right suprahilar masses consistent with malignancy. There is extension of this mass into the right hilum. 2. Right mediastinal soft tissue mass or abnormal lymph node with extension to the right hilum. Bronchoscopy may provide better evaluation. 3. A cluster of nodular density in the right upper lobe extending to the right lateral pleural surface and along the right minor fissure most consistent with satellite nodules. 4. Aortic Atherosclerosis (ICD10-I70.0) and Emphysema (ICD10-J43.9). Electronically Signed   By: Anner Crete M.D.   On: 02/14/2020 20:57   MR BRAIN W WO CONTRAST  Result Date:  02/15/2020 CLINICAL DATA:  Multiple recent falls, confusion, memory loss EXAM: MRI HEAD WITHOUT AND WITH CONTRAST TECHNIQUE: Multiplanar, multiecho pulse sequences of the brain and surrounding structures were obtained without and with intravenous contrast. CONTRAST:  28mL GADAVIST GADOBUTROL 1 MMOL/ML IV SOLN COMPARISON:  None. FINDINGS: Brain: There is no acute infarction or intracranial hemorrhage. There is no intracranial mass, mass effect, or edema. There is no hydrocephalus or extra-axial fluid collection. Prominence of the ventricles and sulci reflects parenchymal volume loss. 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 Additional patchy and confluent T2  hyperintensity in the supratentorial white matter is nonspecific but may reflect moderate to advanced chronic microvascular ischemic changes. Ex vacuo dilatation of the lateral ventricles. No abnormal enhancement. Vascular: Major vessel flow voids at the skull base are preserved. Skull and upper cervical spine: Normal marrow signal is preserved. Sinuses/Orbits: Paranasal sinuses are aerated. Orbits are unremarkable. Other: Sella is unremarkable.  Mastoid air cells are clear. IMPRESSION: No evidence of recent infarction, hemorrhage, or mass. 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 microvascular ischemic changes. Electronically Signed   By: Macy Mis M.D.   On: 02/15/2020 16:13   MR THORACIC SPINE WO CONTRAST  Result Date: 02/14/2020 CLINICAL DATA:  Back pain with leg weakness and foot numbness. EXAM: MRI THORACIC AND LUMBAR SPINE WITHOUT CONTRAST TECHNIQUE: Multiplanar and multiecho pulse sequences of the thoracic and lumbar spine were obtained without intravenous contrast. COMPARISON:  Limited correlation made with chest radiographs 10/23/2017 and lumbar spine radiographs 01/30/2020. FINDINGS: MRI THORACIC SPINE FINDINGS Alignment: The alignment of the thoracic spine is normal. There are moderately advanced degenerative changes in the cervical spine associated with a mild anterolisthesis at C4-5. Vertebrae: No acute or suspicious osseous findings. Multilevel cervical spondylosis with endplate degenerative changes. No evidence of osseous metastatic disease. Cord:  The thoracic cord appears normal in signal and caliber. Paraspinal and other soft tissues: There is an irregular mass medially in the right upper lobe, measuring 2.9 x 2.4 cm on image 9/22. There are associated enlarged right hilar and paratracheal lymph nodes, measuring up to 2.1 cm on image 14/22. These findings are worrisome for bronchogenic carcinoma, and further evaluation with chest CT recommended. No focal paraspinal  abnormalities. Disc levels: The thoracic spinal canal is widely patent. There are minor thoracic spine degenerative changes, and no evidence of disc herniation, spinal stenosis or nerve root encroachment. As above, moderate cervical spondylosis is present without gross cord deformity. MRI LUMBAR SPINE FINDINGS Segmentation: There are 5 lumbar type vertebral bodies. Alignment: Minimal convex right scoliosis. Minimal degenerative anterolisthesis at L4-5 and L5-S1. Vertebrae: No worrisome osseous lesion, acute fracture or pars defect. No evidence of osseous metastatic disease. The visualized sacroiliac joints appear unremarkable. Conus medullaris: Extends to the L1 level and appears normal. Paraspinal and other soft tissues: No significant paraspinal findings. Left renal cortical thinning and cysts are noted. Disc levels: L1-2: Mild disc bulging. No spinal stenosis or nerve root encroachment. L2-3: Normal interspace. L3-4: Mild disc bulging, facet and ligamentous hypertrophy. No significant spinal stenosis or nerve root encroachment. L4-5: Mild disc bulging with moderate facet and ligamentous hypertrophy. Mild spinal stenosis with mild narrowing of the lateral recesses and right foramen. No nerve root encroachment. L5-S1: Mild disc bulging and facet hypertrophy. No significant spinal stenosis or nerve root encroachment. IMPRESSION: 1. Irregular mass medially in the right upper lobe with associated right hilar and paratracheal adenopathy, highly worrisome for bronchogenic carcinoma. Further  evaluation with chest CT with contrast recommended. 2. No acute findings or clear explanation for the patient's symptoms in the spine. No evidence of osseous metastatic disease. 3. Mild multifactorial spinal stenosis at L4-5 with mild narrowing of the lateral recesses and right foramen. 4. Mild disc bulging and facet hypertrophy at the other levels as described. No high-grade spinal stenosis or nerve root encroachment. 5. Moderate to  severe degenerative changes in the cervical spine. Electronically Signed   By: Richardean Sale M.D.   On: 02/14/2020 18:21   MR LUMBAR SPINE WO CONTRAST  Result Date: 02/14/2020 CLINICAL DATA:  Back pain with leg weakness and foot numbness. EXAM: MRI THORACIC AND LUMBAR SPINE WITHOUT CONTRAST TECHNIQUE: Multiplanar and multiecho pulse sequences of the thoracic and lumbar spine were obtained without intravenous contrast. COMPARISON:  Limited correlation made with chest radiographs 10/23/2017 and lumbar spine radiographs 01/30/2020. FINDINGS: MRI THORACIC SPINE FINDINGS Alignment: The alignment of the thoracic spine is normal. There are moderately advanced degenerative changes in the cervical spine associated with a mild anterolisthesis at C4-5. Vertebrae: No acute or suspicious osseous findings. Multilevel cervical spondylosis with endplate degenerative changes. No evidence of osseous metastatic disease. Cord:  The thoracic cord appears normal in signal and caliber. Paraspinal and other soft tissues: There is an irregular mass medially in the right upper lobe, measuring 2.9 x 2.4 cm on image 9/22. There are associated enlarged right hilar and paratracheal lymph nodes, measuring up to 2.1 cm on image 14/22. These findings are worrisome for bronchogenic carcinoma, and further evaluation with chest CT recommended. No focal paraspinal abnormalities. Disc levels: The thoracic spinal canal is widely patent. There are minor thoracic spine degenerative changes, and no evidence of disc herniation, spinal stenosis or nerve root encroachment. As above, moderate cervical spondylosis is present without gross cord deformity. MRI LUMBAR SPINE FINDINGS Segmentation: There are 5 lumbar type vertebral bodies. Alignment: Minimal convex right scoliosis. Minimal degenerative anterolisthesis at L4-5 and L5-S1. Vertebrae: No worrisome osseous lesion, acute fracture or pars defect. No evidence of osseous metastatic disease. The  visualized sacroiliac joints appear unremarkable. Conus medullaris: Extends to the L1 level and appears normal. Paraspinal and other soft tissues: No significant paraspinal findings. Left renal cortical thinning and cysts are noted. Disc levels: L1-2: Mild disc bulging. No spinal stenosis or nerve root encroachment. L2-3: Normal interspace. L3-4: Mild disc bulging, facet and ligamentous hypertrophy. No significant spinal stenosis or nerve root encroachment. L4-5: Mild disc bulging with moderate facet and ligamentous hypertrophy. Mild spinal stenosis with mild narrowing of the lateral recesses and right foramen. No nerve root encroachment. L5-S1: Mild disc bulging and facet hypertrophy. No significant spinal stenosis or nerve root encroachment. IMPRESSION: 1. Irregular mass medially in the right upper lobe with associated right hilar and paratracheal adenopathy, highly worrisome for bronchogenic carcinoma. Further evaluation with chest CT with contrast recommended. 2. No acute findings or clear explanation for the patient's symptoms in the spine. No evidence of osseous metastatic disease. 3. Mild multifactorial spinal stenosis at L4-5 with mild narrowing of the lateral recesses and right foramen. 4. Mild disc bulging and facet hypertrophy at the other levels as described. No high-grade spinal stenosis or nerve root encroachment. 5. 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 Electronically Signed   By: Richardean Sale M.D.   On: 02/14/2020 18:21   NM PET Image Initial (PI) Skull Base To Thigh  Result Date: 03/11/2020 CLINICAL DATA:  Initial treatment strategy for non-small cell lung cancer. EXAM: NUCLEAR  MEDICINE PET SKULL BASE TO THIGH TECHNIQUE: 11.57 mCi F-18 FDG was injected intravenously. Full-ring PET imaging was performed from the skull base to thigh after the radiotracer. CT data was obtained and used for attenuation correction and anatomic localization. Fasting blood glucose: 121 mg/dl  COMPARISON:  CT chest from 02/14/2020 FINDINGS: Mediastinal blood pool activity: SUV max 4.18 Liver activity: SUV max NA NECK: No hypermetabolic lymph nodes in the neck. Incidental CT findings: none CHEST: Right paratracheal lymph node measures 2.1 x 2.0 cm with SUV max of 15.98. Right hilar lymph node is FDG avid measuring approximately 1.5 cm and has an SUV max of 11.27. No FDG avid lymph nodes within the left side of mediastinum or left hilar region. There is a subpleural mass within the anteromedial right lung apex which invades the superior mediastinum measuring 3.0 x 2.0 cm, image 46/4. The SUV max is equal to 24.2. Incidental CT findings: Calcified granuloma is identified within the posterior right lung base, image 45/8. Mild cardiac enlargement. Aortic atherosclerosis. Lad and RCA coronary artery atherosclerotic calcifications identified. ABDOMEN/PELVIS: No abnormal FDG uptake within the liver, pancreas, spleen, or adrenal glands. FDG avid lymph node within the right lower quadrant mesentery measures 0.8 cm and has an SUV max of 7.9. Incidental CT findings: Nonobstructing stone identified within inferior pole of right kidney. Aortic atherosclerosis without aneurysm. Calcification within the posterior right bladder measures 4 mm. SKELETON: No focal hypermetabolic activity to suggest skeletal metastasis. Incidental CT findings: Focal area of increased tracer uptake localizing to the sacrococcygeal junction. The SUV max is equal to 5.08. Given the absence of additional foci of increased uptake this is favored to represent arthro pathic changes. No aggressive lytic or sclerotic bone lesions identified. IMPRESSION: 1. FDG avid lesions within the paramediastinal aspect of the right apex and right upper lobe concerning for primary bronchogenic carcinoma. 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 2. Single subcentimeter lymph node within the right lower quadrant small bowel  mesentery is FDG avid within SUV max of 7.9. This is of uncertain clinical significance and would be an unusual focus of metastasis given absence of disease elsewhere in the abdomen and pelvis. 3. Small focus of moderate uptake localizing to the sacrococcygeal junction is favored to represent arthropathic changes. No additional foci of increased tracer activity identified within the imaged portions of the axial and appendicular skeleton. 4. Aortic Atherosclerosis (ICD10-I70.0). Coronary artery calcifications. Electronically Signed   By: Kerby Moors M.D.   On: 03/11/2020 09:12   DG CHEST PORT 1 VIEW  Result Date: 02/20/2020 CLINICAL DATA:  Wheezing EXAM: PORTABLE CHEST 1 VIEW COMPARISON:  02/18/2020 FINDINGS: Improved aeration at the right base. Continued mild atelectasis or infiltrate. Left lung clear. Heart is borderline in size. Aortic atherosclerosis. IMPRESSION: Improving right basilar opacity with mild residual right base atelectasis or infiltrate. Electronically Signed   By: Rolm Baptise M.D.   On: 02/20/2020 19:17   DG Chest Port 1 View  Result Date: 02/18/2020 CLINICAL DATA:  Status post bronchoscopy EXAM: PORTABLE CHEST 1 VIEW COMPARISON:  Chest CT February 14, 2020 FINDINGS: No pneumothorax. There is postoperative change on the right with volume loss. The pulmonary nodular opacities seen along the medial right apex and right medial suprahilar regions are less evident than on recent CT. Ill-defined opacities in these areas is present. The left lung is clear. Heart size and pulmonary vascular normal. No adenopathy. Adenopathy evident on recent CT is not well appreciated by radiography. IMPRESSION: No pneumothorax.  Ill-defined opacity right apex and right suprahilar regions medially present but better delineated on recent CT. Volume loss of postoperative change noted on the right. No new opacity evident. Left lung clear. Cardiac silhouette normal. Adenopathy seen recent CT not well seen by portable  radiography examination. Electronically Signed   By: Lowella Grip III M.D.   On: 02/18/2020 15:07   DG C-ARM BRONCHOSCOPY  Result Date: 02/18/2020 C-ARM BRONCHOSCOPY: Fluoroscopy was utilized by the requesting physician.  No radiographic interpretation.    ASSESSMENT AND PLAN: This is a very pleasant 79 years old white female with a stage IIIb (T2 a, N2, M0) lung cancer of unknown subtype at this point but suspicious for carcinoma with neuroendocrine features.  Presented with right upper lobe lung mass as well as mediastinal lymphadenopathy diagnosed in June 2001. I personally and independently reviewed the PET scan images and discussed the results with the patient and her family. Her PET scan showed no concerning findings for metastatic disease outside the chest except for small focus of moderate uptake localizing to the sacral coccygeal junction favored to represent arthropathic changes there was also a single subcentimeter lymph node within the right lower quadrant small bowel mesentery, nonspecific. I recommended for the patient to discuss her PET scan results again at the weekly thoracic conference for evaluation and the best option to obtain another biopsy to confirm her final pathology.  The previous pathology report is unclear and I am not sure if I will be able to treat this patient with small cell or non-small cell regimen. The patient and her family agreed to the current plan. We will call the patient with the recommendation after the weekly conference and we will arrange her follow-up visit accordingly. The patient was advised to call immediately if she has any other concerning symptoms in the interval.  Disclaimer: This note was dictated with voice recognition software. Similar sounding words can inadvertently be transcribed and may be missed upon review.      The patient voices understanding of current disease status and treatment options and is in agreement with the current  care plan.  All questions were answered. The patient knows to call the clinic with any problems, questions or concerns. We can certainly see the patient much sooner if necessary.  The total time spent in the appointment was 30 minutes.  Disclaimer: This note was dictated with voice recognition software. Similar sounding words can inadvertently be transcribed and may not be corrected upon review.

## 2020-03-11 NOTE — Telephone Encounter (Signed)
err

## 2020-03-11 NOTE — Progress Notes (Signed)
Leah Velasquez. [place] Female, 79 y.o., 21-May-1941 MRN:  [id number] Phone:  [phone] (H) ... 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

## 2020-03-11 NOTE — Telephone Encounter (Signed)
Home Health assessment-Spoke to Dr Doreene Nest and requested home health to assess  pt/dtr  request for hospital bed . Dr Doreene Nest said she has an appt soon to see pt and she can do a face to face and assess needs and will order if needed.  I told Dr Doreene Nest that pt stated her bed is too high for her to get into -she is sleeping in a chair.  I left message with Siri Cole to f/u with Dr. Doreene Nest and for her mother to keep upcoming appt with Dr Doreene Nest.

## 2020-03-12 ENCOUNTER — Encounter: Payer: Self-pay | Admitting: *Deleted

## 2020-03-12 ENCOUNTER — Telehealth: Payer: Self-pay | Admitting: Surgery

## 2020-03-12 DIAGNOSIS — R918 Other nonspecific abnormal finding of lung field: Secondary | ICD-10-CM

## 2020-03-12 LAB — URINALYSIS, ROUTINE W REFLEX MICROSCOPIC
Bilirubin Urine: NEGATIVE
Glucose, UA: NEGATIVE mg/dL
Hgb urine dipstick: NEGATIVE
Ketones, ur: NEGATIVE mg/dL
Leukocytes,Ua: NEGATIVE
Nitrite: NEGATIVE
Protein, ur: NEGATIVE mg/dL
Specific Gravity, Urine: 1.012 (ref 1.005–1.030)
pH: 5 (ref 5.0–8.0)

## 2020-03-12 MED ORDER — LIDOCAINE 5 % EX PTCH
1.0000 | MEDICATED_PATCH | CUTANEOUS | Status: DC
  Administered 2020-03-12: 1 via TRANSDERMAL
  Filled 2020-03-12: qty 1

## 2020-03-12 MED ORDER — POTASSIUM CHLORIDE CRYS ER 20 MEQ PO TBCR
40.0000 meq | EXTENDED_RELEASE_TABLET | Freq: Once | ORAL | Status: AC
  Administered 2020-03-12: 40 meq via ORAL
  Filled 2020-03-12: qty 2

## 2020-03-12 NOTE — Care Management (Addendum)
    Durable Medical Equipment  (From admission, onward)         Start     Ordered   03/12/20 0000  For home use only DME Hospital bed       Question Answer Comment  Length of Need Lifetime   Bed type Semi-electric      03/12/20 1147         03/12/20 14:00 Patient was discharged prior to the ED CM seeing patient all information was obtained via phone.

## 2020-03-12 NOTE — ED Notes (Signed)
The pt had wanted to call his wife at 53am today when I went in to  Help him call he could not remember her number

## 2020-03-12 NOTE — Progress Notes (Signed)
Per Dr. Julien Nordmann Leah Velasquez case was discussed in cancer conference today.  Recommendation from the discussion is for patient to be referred to thoracic surgery.  I called patient to update and that a referral has been made. She verbalized understanding.

## 2020-03-12 NOTE — ED Notes (Signed)
Breakfast Ordered--Leah Velasquez  

## 2020-03-12 NOTE — ED Provider Notes (Signed)
  Physical Exam  BP (!) 135/55 (BP Location: Right Arm)   Pulse 97   Temp 98.4 F (36.9 C)   Resp (!) 23   Ht 5\' 1"  (1.549 m)   Wt 105 kg   SpO2 95%   BMI 43.74 kg/m   Physical Exam  ED Course/Procedures     Procedures  MDM  Received care of patient from Dr. Betsey Holiday who had received care of patient from Dr. Alvino Chapel. Seen last night and medically cleared and has been awaiting assistance from CM.  Patient dc from rehab facility, did not like being at the facility but reports her mobility was better when she was working with PT and others.  Initially reported she did not want to return home but also did not want placement in a facility.  No sign of other acute change leading to difficulty ambulating including no CVA, infectious findings, nor electrolyte abnormalities.  Lives with husband, has chair that she can transfer from.  After discussion concluded she feels comfortable with home health and continuing to address possible placement as an outpatient.  Home health consult placed. Pt also requesting hospital bed and this ordered. Patient discharged in stable condition with understanding of reasons to return.      Gareth Morgan, MD 03/13/20 [phone]

## 2020-03-12 NOTE — ED Notes (Signed)
The pt is back to sleep at present  She has been sleeping for the biggest portion of the 2300-0700 sjift

## 2020-03-12 NOTE — ED Notes (Signed)
Pt sitting up and eating breakfast

## 2020-03-12 NOTE — ED Notes (Signed)
bp from 0230 until 0500 invaled bp cuff was left off after blood draw

## 2020-03-12 NOTE — Telephone Encounter (Signed)
ED CM received consult concerning [place] and DME.  CM contacted patient by phone to confirm patient is agreeable.  CM provided the list of CMS quality HH agencies from [employer]. CM explained that we will fax referral out to several of the preferred [facility] agencies that are in network with her insurance and contact the agencies tomorrow for an answer on accepting the referral, patient is agreeable. Hospital bed was also ordered from [facility].   CM explained that CM will follow up tomorrow afternoon. Referral was faxed to [facility] and [facility].

## 2020-03-12 NOTE — ED Notes (Signed)
Called [facility] for transport.  

## 2020-03-12 NOTE — ED Notes (Signed)
Attempted to call daughter to notify pt is on her way via [facility]. No answer. Pt stated she is calling her husband to notify him.

## 2020-03-12 NOTE — ED Notes (Signed)
In and out cath urine sent

## 2020-03-12 NOTE — ED Notes (Signed)
Pt states she used wheelchair to get around. Pt states she can stand, pivot and get into chair. She feels the rehab facility did not help her as much as she would have liked and that home health would possibly be better. She does believe she needs more help at home.

## 2020-03-13 ENCOUNTER — Encounter (HOSPITAL_COMMUNITY): Payer: Self-pay | Admitting: Radiology

## 2020-03-13 ENCOUNTER — Telehealth: Payer: Self-pay | Admitting: Surgery

## 2020-03-13 NOTE — Progress Notes (Signed)
Leah Velasquez. [place] Female, 79 y.o., 1941/04/26 MRN:  [id number] Phone:  [phone] (H) ... PCP:  [name], MD Coverage:  [employer] Medicare/Humana Medicare Hmo Next Appt With Radiology ([facility]-CT 3) 03/23/2020 at 8:00 AM  RE: CT Biopsy Received: Today Curt Bears, MD  Garth Bigness D She is seeing cardiothoracic surgery for repeat bronchoscopy and biopsy. Please hold on the CT biopsy for now. Thank you.       Previous Messages   ----- Message -----  From: Garth Bigness D  Sent: 03/13/2020  9:14 AM EDT  To: Curt Bears, MD  Subject: CT Biopsy                     Good Morning, do you still want to proceed with patients biopsy or do you want to hold off for now. I have her scheduled but when I called patient she stated that you might want to hold off for now. Please advise, so that I can contact patient. Thanks [name]

## 2020-03-13 NOTE — Telephone Encounter (Signed)
ED CM contacted patient to confirm [facility] services with [facility], patient made aware that they may not be out until Tuesday 7/27 patient is agreeable. Patient reports declining  hospital bed due to heigh issues.

## 2020-03-16 ENCOUNTER — Telehealth: Payer: Self-pay | Admitting: Pharmacist Clinician (PhC)/ Clinical Pharmacy Specialist

## 2020-03-16 ENCOUNTER — Other Ambulatory Visit: Payer: Self-pay | Admitting: Cardiovascular Disease

## 2020-03-16 NOTE — Telephone Encounter (Signed)
Spoke with [facility] RN.  Patient new to their service, was d/c from [facility] Friday with warfarin 3 mg qd x 3.5 mg MWF.  Only given a few tablets.  Did not take any warfarin since d/c because she was concerned after reading drug info sheet.    HHRN will try to get patient to restart today.  Will start at 2.5 mg daily x 5 mg MF and check INR Thursday.

## 2020-03-17 ENCOUNTER — Ambulatory Visit (HOSPITAL_COMMUNITY): Payer: Medicare HMO

## 2020-03-17 ENCOUNTER — Other Ambulatory Visit: Payer: Self-pay | Admitting: *Deleted

## 2020-03-17 MED ORDER — LOSARTAN POTASSIUM 50 MG PO TABS: ORAL_TABLET | ORAL | 0 refills | Status: DC

## 2020-03-17 NOTE — Telephone Encounter (Signed)
Rx has been sent to the pharmacy electronically. ° °

## 2020-03-19 ENCOUNTER — Telehealth: Payer: Self-pay | Admitting: Cardiovascular Disease

## 2020-03-19 ENCOUNTER — Ambulatory Visit (INDEPENDENT_AMBULATORY_CARE_PROVIDER_SITE_OTHER): Payer: Medicare HMO | Admitting: Cardiovascular Disease

## 2020-03-19 DIAGNOSIS — I4891 Unspecified atrial fibrillation: Secondary | ICD-10-CM

## 2020-03-19 DIAGNOSIS — Z7901 Long term (current) use of anticoagulants: Secondary | ICD-10-CM

## 2020-03-19 LAB — POCT INR: INR: 1.6 — AB (ref 2.0–3.0)

## 2020-03-19 NOTE — Telephone Encounter (Signed)
New Message  Pt c/o swelling: STAT is pt has developed SOB within 24 hours  1) How much weight have you gained and in what time span? 12 lbs since 03/11/20  2) If swelling, where is the swelling located? Feet and ankles  3) Are you currently taking a fluid pill? Yes  4) Are you currently SOB? Yes, when laying down  5) Do you have a log of your daily weights (if so, list)? 231, 243  6) Have you gained 3 pounds in a day or 5 pounds in a week? n/a  7) Have you traveled recently? No

## 2020-03-19 NOTE — Telephone Encounter (Deleted)
  [place] [phone]   Spoke with Eustaquio Maize with [facility] regarding the pt recently has been dc'd from the hospital and rehab after multiple falls and being dx with lung ca.   She has been very noncompliant with her medications. She has not been taking her lasix. She is sob, has had a 12 lb weight gain but unsure over what period of time, 1+ edema in both of her ankles.   She is being followed now by oncology. She will be seeing the surgeons next week regarding her mass. She has not been in to see Dr. Gwenlyn Found since 12/2018. She has had to cancel her 01/2020 appt due to being hospitalized.   Pt is not very mobile and very difficult to travel to appts at this time. She has a virtual appt with her PCP Dr. Doreene Nest next Tuesday since they also have not seen her in quite a while.  Pts BP today was 122/80.   I was unable to schedule the pt any sooner than 04/22/2020 with Dr. Gwenlyn Found. I have asked the [facility] nurse to please have her take her lasix and K daily to see if it gives her improvement. I will forward to Dr. Gwenlyn Found for his review.

## 2020-03-19 NOTE — Telephone Encounter (Signed)
Spoke with Beth with [facility] [phone]  regarding the pt recently has been dc'd from the hospital and rehab after multiple falls and being dx with lung ca.   She has been very noncompliant with her medications. She has not been taking her lasix. She does not want to get up to urinate often.  She is sob, has had a 12 lb weight gain but unsure over what period of time, 1+ edema in both of her ankles.   She is being followed now by oncology. She will be seeing the surgeons next week regarding her mass. She has not been in to see Dr. Gwenlyn Found since 12/2018. She has had to cancel her 01/2020 appt due to being hospitalized.   Pt is not very mobile and very difficult to travel to appts at this time. She has a virtual appt with her PCP Dr. Doreene Nest next Tuesday since they also have not seen her in quite a while.  Pts BP today was 122/80.   I was unable to schedule the pt any sooner than 04/22/2020 with Dr. Gwenlyn Found. I have asked the [facility] nurse to please have her take her lasix and K daily to see if it gives her improvement. I will forward to Dr. Gwenlyn Found for his review.

## 2020-03-20 ENCOUNTER — Other Ambulatory Visit (HOSPITAL_COMMUNITY): Payer: Medicare HMO

## 2020-03-20 ENCOUNTER — Ambulatory Visit (HOSPITAL_COMMUNITY): Payer: Medicare HMO

## 2020-03-20 NOTE — Telephone Encounter (Signed)
Unable to reach pt or leave a message mailbox is full 

## 2020-03-20 NOTE — Telephone Encounter (Signed)
Please schedule her to see one of our APP is for a virtual visit sometime in the next 2 weeks.

## 2020-03-23 ENCOUNTER — Other Ambulatory Visit: Payer: Self-pay

## 2020-03-23 ENCOUNTER — Other Ambulatory Visit (HOSPITAL_COMMUNITY): Payer: Medicare HMO

## 2020-03-23 ENCOUNTER — Telehealth: Payer: Self-pay | Admitting: Pharmacist Clinician (PhC)/ Clinical Pharmacy Specialist

## 2020-03-23 ENCOUNTER — Institutional Professional Consult (permissible substitution): Payer: Medicare HMO | Admitting: Thoracic Surgery (Cardiothoracic Vascular Surgery)

## 2020-03-23 ENCOUNTER — Other Ambulatory Visit: Payer: Self-pay | Admitting: *Deleted

## 2020-03-23 VITALS — BP 140/84 | HR 86 | Temp 97.6°F | Resp 20 | Ht 61.0 in | Wt 231.0 lb

## 2020-03-23 DIAGNOSIS — R590 Localized enlarged lymph nodes: Secondary | ICD-10-CM

## 2020-03-23 DIAGNOSIS — R918 Other nonspecific abnormal finding of lung field: Secondary | ICD-10-CM

## 2020-03-23 NOTE — Progress Notes (Signed)
PCP is Briscoe, Jannifer Rodney, MD Referring Provider is Curt Bears, MD  Chief Complaint  Patient presents with  . Lung Mass    Surgical eval, PET Scan 03/10/20, Chest CT 02/14/20,  Flexible video fiberoptic bronchoscopy with electromagnetic navigation and biopsies 02/18/20-Dr Byrum    HPI: Leah Velasquez is sent for consultation regarding a stage IIIb lung cancer.  Leah Velasquez is a 79 year old woman with a past history significant for lung cancer status post right lower lobectomy, tobacco abuse, hypertension, hyperlipidemia, obesity, decompensated diastolic left heart failure, and rheumatoid arthritis.  She underwent a right lower lobectomy for an early stage lung cancer in [place], [place] about 20 years ago.  She quit smoking around that time.  Recently she was admitted after a fall.  During her evaluation she was found to have a right upper lobe lung mass.  A CT of the chest showed a right upper lobe lung mass with probable invasion of the mediastinum, a second more central right upper lobe nodule and mediastinal and hilar adenopathy.  On PET CT these were all markedly hypermetabolic.  She had navigational bronchoscopy by Dr. Lamonte Sakai on 02/18/2020.  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  Pathology was not able to differentiate small cell carcinoma versus a nonsmall cell neuroendocrine tumor.  She saw Dr. Julien Nordmann in consultation, but he cannot initiate treatment until it is known whether this is small cell or non-small cell.  She currently is at home.  She is pretty much confined to a wheelchair.  She can get up and pivot with assistance.  As noted she quit smoking 20 years ago.  She has had some swelling in her legs.  She has been noncompliant with Lasix.  Zubrod Score: At the time of surgery this patient's most appropriate activity status/level should be described as: []     0    Normal activity, no symptoms []     1    Restricted in physical strenuous activity but ambulatory, able to do out  light work []     2    Ambulatory and capable of self care, unable to do work activities, up and about >50 % of waking hours                              [x]     3    Only limited self care, in bed greater than 50% of waking hours []     4    Completely disabled, no self care, confined to bed or chair []     5    Moribund  Past Medical History:  Diagnosis Date  . A-fib ([place])   . Hemorrhoids   . Hyperlipidemia   . Hypertension   . Lung cancer ([place]) 2000   RLL  . Obesity   . Persistent atrial fibrillation ([place])   . Rheumatoid arthritis [facility])     Past Surgical History:  Procedure Laterality Date  . BREAST EXCISIONAL BIOPSY Left    x 2  . BREAST EXCISIONAL BIOPSY Right   . BRONCHIAL BIOPSY  02/18/2020   Procedure: BRONCHIAL BIOPSIES;  Surgeon: Collene Gobble, MD;  Location: [facility] ENDOSCOPY;  Service: Cardiopulmonary;;  . BRONCHIAL BRUSHINGS  02/18/2020   Procedure: BRONCHIAL BRUSHINGS;  Surgeon: Collene Gobble, MD;  Location: [place];  Service: Cardiopulmonary;;  . BRONCHIAL NEEDLE ASPIRATION BIOPSY  02/18/2020   Procedure: BRONCHIAL NEEDLE ASPIRATION BIOPSIES;  Surgeon: Collene Gobble, MD;  Location: [facility] ENDOSCOPY;  Service: Cardiopulmonary;;  . BRONCHIAL WASHINGS  02/18/2020   Procedure: BRONCHIAL WASHINGS;  Surgeon: Collene Gobble, MD;  Location: [place];  Service: Cardiopulmonary;;  . CARDIOVERSION N/A 01/09/2018   Procedure: CARDIOVERSION;  Surgeon: Josue Hector, MD;  Location: [facility] ENDOSCOPY;  Service: Cardiovascular;  Laterality: N/A;  . CARDIOVERSION N/A 03/01/2018   Procedure: CARDIOVERSION;  Surgeon: Sueanne Margarita, MD;  Location: [facility] ENDOSCOPY;  Service: Cardiovascular;  Laterality: N/A;  . ELECTROMAGNETIC NAVIGATION BROCHOSCOPY N/A 02/18/2020   Procedure: ELECTROMAGNETIC NAVIGATION BRONCHOSCOPY;  Surgeon: Collene Gobble, MD;  Location: [place];  Service: Cardiopulmonary;  Laterality: N/A;  . KNEE SURGERY    . LUNG LOBECTOMY Right 2000   RLL removed  . VIDEO  BRONCHOSCOPY N/A 02/18/2020   Procedure: VIDEO BRONCHOSCOPY WITH FLUORO;  Surgeon: Collene Gobble, MD;  Location: [facility] ENDOSCOPY;  Service: Cardiopulmonary;  Laterality: N/A;    No family history on file.  Social History Social History   Tobacco Use  . Smoking status: Never Smoker  . Smokeless tobacco: Never Used  Vaping Use  . Vaping Use: Never used  Substance Use Topics  . Alcohol use: No  . Drug use: No    Current Outpatient Medications  Medication Sig Dispense Refill  . albuterol (VENTOLIN HFA) 108 (90 Base) MCG/ACT inhaler Inhale 1-2 puffs into the lungs every 6 (six) hours as needed for wheezing or shortness of breath.     . calcium carbonate (TUMS - DOSED IN MG ELEMENTAL CALCIUM) 500 MG chewable tablet Chew 1 tablet (200 mg of elemental calcium total) by mouth 3 (three) times daily as needed for indigestion or heartburn.    . Cholecalciferol (VITAMIN D3) 1000 units CAPS Take 1,000 Units by mouth daily.    Marland Kitchen conjugated estrogens (PREMARIN) vaginal cream Place 1 Applicatorful vaginally 2 (two) times a week.    . esomeprazole (NEXIUM) 20 MG packet Take 20 mg by mouth daily as needed (Indigestion).     . fluticasone (FLONASE) 50 MCG/ACT nasal spray Place 1 spray into both nostrils daily.     . furosemide (LASIX) 40 MG tablet TAKE 1 TABLET EVERY DAY 90 tablet 0  . hydrocortisone (ANUSOL-HC) 2.5 % rectal cream Apply rectally 2 times daily (Patient taking differently: Place 1 application rectally 2 (two) times daily as needed for hemorrhoids. ) 28.35 g 1  . levocetirizine (XYZAL) 5 MG tablet Take 5 mg by mouth every evening.     . lidocaine (LIDODERM) 5 % Place 1 patch onto the skin daily. Remove & Discard patch within 12 hours or as directed by MD 30 patch 0  . losartan (COZAAR) 50 MG tablet TAKE 1 TABLET DAILY. PLEASE CALL TO SCHEDULE APPOINTMENT FOR FURTHER REFILLS. 90 tablet 0  . simvastatin (ZOCOR) 20 MG tablet Take 20 mg by mouth at bedtime.    . timolol (TIMOPTIC) 0.5 %  ophthalmic solution Place 1 drop into both eyes in the morning and at bedtime.    . traMADol (ULTRAM) 50 MG tablet Take 50 mg by mouth daily as needed for moderate pain.   0  . vitamin B-12 1000 MCG tablet Take 1 tablet (1,000 mcg total) by mouth daily. 30 tablet 0  . warfarin (COUMADIN) 5 MG tablet Take 2.5-5 mg by mouth See admin instructions. Take 2.5mg  Sunday, Monday, Wednesday and Friday then take 5mg  Sat, Tues, Thurs    . zolpidem (AMBIEN) 5 MG tablet Take 2.5 mg by mouth at bedtime.    . metoprolol succinate (TOPROL XL) 25  MG 24 hr tablet Take 0.5 tablets (12.5 mg total) by mouth at bedtime. 45 tablet 3   No current facility-administered medications for this visit.    Allergies  Allergen Reactions  . Morphine Anaphylaxis  . Morphine And Related Other (See Comments)    "vitals go down"  . Morphine And Related     Pass out   . Sulfa Antibiotics Rash  . Sulfa Antibiotics Rash  . Sulfa Antibiotics Rash    Review of Systems  Constitutional: Positive for activity change, fatigue and unexpected weight change (Weight gain). Negative for appetite change.  HENT: Negative for trouble swallowing and voice change.   Respiratory: Positive for shortness of breath. Negative for wheezing.   Cardiovascular: Positive for palpitations and leg swelling. Negative for chest pain.  Gastrointestinal: Positive for abdominal pain (Reflux), constipation and diarrhea.  Genitourinary: Positive for dysuria and frequency.  Musculoskeletal: Positive for arthralgias and joint swelling.  Neurological: Positive for dizziness, syncope and weakness (Generalized). Negative for seizures.  Hematological: Bruises/bleeds easily (Warfarin).  Psychiatric/Behavioral: The patient is nervous/anxious.   All other systems reviewed and are negative.   BP 140/84   Pulse 86   Temp 97.6 F (36.4 C) (Skin)   Resp 20   Ht 5\' 1"  (1.549 m)   Wt 231 lb (104.8 kg)   SpO2 95% Comment: RA  BMI 43.65 kg/m  Physical  Exam Vitals reviewed.  Constitutional:      General: She is not in acute distress.    Appearance: She is obese. She is ill-appearing.  HENT:     Head: Normocephalic and atraumatic.  Eyes:     Extraocular Movements: Extraocular movements intact.  Neck:     Vascular: No carotid bruit.  Cardiovascular:     Rate and Rhythm: Rhythm irregular.     Heart sounds: No murmur heard.   Pulmonary:     Effort: Pulmonary effort is normal. No respiratory distress.     Breath sounds: No wheezing or rales.     Comments: Diminished breath sounds at right base Abdominal:     General: There is no distension.     Palpations: Abdomen is soft.  Musculoskeletal:     Cervical back: Neck supple.     Right lower leg: Edema (3+) present.     Left lower leg: Edema (3+) present.  Lymphadenopathy:     Cervical: No cervical adenopathy.  Skin:    General: Skin is warm and dry.  Neurological:     General: No focal deficit present.     Mental Status: She is oriented to person, place, and time.     Motor: No weakness.    Diagnostic Tests: NUCLEAR MEDICINE PET SKULL BASE TO THIGH  TECHNIQUE: 11.57 mCi F-18 FDG was injected intravenously. Full-ring PET imaging was performed from the skull base to thigh after the radiotracer. CT data was obtained and used for attenuation correction and anatomic localization.  Fasting blood glucose: 121 mg/dl  COMPARISON:  CT chest from 02/14/2020  FINDINGS: Mediastinal blood pool activity: SUV max 4.18  Liver activity: SUV max NA  NECK: No hypermetabolic lymph nodes in the neck.  Incidental CT findings: none  CHEST: Right paratracheal lymph node measures 2.1 x 2.0 cm with SUV max of 15.98.  Right hilar lymph node is FDG avid measuring approximately 1.5 cm and has an SUV max of 11.27. No FDG avid lymph nodes within the left side of mediastinum or left hilar region.  There is a subpleural mass within the  anteromedial right lung apex which invades  the superior mediastinum measuring 3.0 x 2.0 cm, image 46/4. The SUV max is equal to 24.2.  Incidental CT findings: Calcified granuloma is identified within the posterior right lung base, image 45/8. Mild cardiac enlargement. Aortic atherosclerosis. Lad and RCA coronary artery atherosclerotic calcifications identified.  ABDOMEN/PELVIS: No abnormal FDG uptake within the liver, pancreas, spleen, or adrenal glands. FDG avid lymph node within the right lower quadrant mesentery measures 0.8 cm and has an SUV max of 7.9.  Incidental CT findings: Nonobstructing stone identified within inferior pole of right kidney. Aortic atherosclerosis without aneurysm. Calcification within the posterior right bladder measures 4 mm.  SKELETON: No focal hypermetabolic activity to suggest skeletal metastasis.  Incidental CT findings: Focal area of increased tracer uptake localizing to the sacrococcygeal junction. The SUV max is equal to 5.08. Given the absence of additional foci of increased uptake this is favored to represent arthro pathic changes. No aggressive lytic or sclerotic bone lesions identified.  IMPRESSION: 1. FDG avid lesions within the paramediastinal aspect of the right apex and right upper lobe concerning for primary bronchogenic carcinoma. 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 2. Single subcentimeter lymph node within the right lower quadrant small bowel mesentery is FDG avid within SUV max of 7.9. This is of uncertain clinical significance and would be an unusual focus of metastasis given absence of disease elsewhere in the abdomen and pelvis. 3. Small focus of moderate uptake localizing to the sacrococcygeal junction is favored to represent arthropathic changes. No additional foci of increased tracer activity identified within the imaged portions of the axial and appendicular skeleton. 4. Aortic Atherosclerosis (ICD10-I70.0). Coronary  artery calcifications.   Electronically Signed   By: Kerby Moors M.D.   On: 03/11/2020 09:12  Impression: Yocheved Depner is a 79 year old former smoker with a history of lung cancer about 20 years ago.  She also has a history of hypertension, hyperlipidemia, persistent atrial fibrillation, obesity, recent syncopal episode, rheumatoid arthritis, and pseudogout.  She recently was admitted after a syncopal spell.  During her work-up she was found to have a right upper lobe lung mass.  Further work-up revealed a right upper lobe lung mass with possible mediastinal invasion, a second more central nodule in the right upper lobe, and hilar and mediastinal adenopathy.  She underwent a navigational bronchoscopy which showed malignancy with neuroendocrine features.  Unfortunately there was not sufficient tissue to differentiate between small cell carcinoma and non-small cell (large cell neuroendocrine).  She saw Dr. Julien Nordmann from oncology.  He needs additional tissue to determine the definitive diagnosis so that appropriate treatment can be initiated.  Plan is for chemo and radiation.  The issue is selecting the appropriate chemotherapeutic agents.  I recommended that we repeat a navigational bronchoscopy and also do an endobronchial ultrasound.  She has had navigational bronchoscopy before and is well aware of the nature of the procedure.  We would plan to do this under general anesthesia so that we can do repeated sampling.  I informed her and her husband of the indications, risks, benefits, and alternatives.  They understand this is diagnostic and not therapeutic.  They understand the risks include, but are not limited to death, MI, DVT, PE, stroke, bleeding, pneumothorax, and failure to obtain the sufficient tissue for definitive diagnosis.  She accepts the risks and agrees to proceed.  She is on warfarin.  She will need to hold that for 5 days prior to the procedure.  She  was subtherapeutic at her last  check but her dose was increased since then.  First available opportunity for biopsy is next Wednesday, 04/01/2020  Plan: Electromagnetic navigational bronchoscopy and endobronchial ultrasound on Wednesday, 04/01/2020.  We will plan to do this as an outpatient procedure  I spent 45 minutes in review of images, records, and in consultation with Mrs. Drudge today. [name], MD [facility] [phone]

## 2020-03-23 NOTE — Telephone Encounter (Signed)
RN Beth called today - she and Raquel Rodriguez-Guzman had both tried to reach out to patient last week regarding need for boost dose on warfarin.  Neither were able to reach patient.  RN finally got in touch with patient today.  She stated was having problems with phone company.  Because she did not hear back from RN with dose information last Thursday, she stopped taking warfarin.  Assumed it was better to not take med than take wrong dose.    Advised RN to have patient take 7.5 mg  X 2 days then 5 mg Wednesday.  Nurse will see patient Thursday for repeat INR.  (Note -Eustaquio Maize is on vacation, another RN will call results)

## 2020-03-24 ENCOUNTER — Encounter: Payer: Self-pay | Admitting: *Deleted

## 2020-03-24 ENCOUNTER — Telehealth: Payer: Self-pay | Admitting: Cardiovascular Disease

## 2020-03-24 NOTE — Telephone Encounter (Signed)
Dr. Gwenlyn Found patient, he is in office, can review.

## 2020-03-24 NOTE — Telephone Encounter (Signed)
Returned call to pt she states that her physical therapist came over today and states that her weight is up today to 237 today. She states that she has not taken her lasix today(because the therapists were coming and did not want to "be in the bathroom all the time") she states that most days she only takes 1/2(20mg )of her lasix. She states that  It"makes her dizzy and the ER MD told her to take 1/2 when this happens"  She states that she is not SOB currently, this only happens sometimes at night when she lays down. She has adjustable bed and will elevate HOB when she is SOB. Her recent CMP(03-11-20) has CREAT 1.05. Her recent weight on 8/3    237 lbs, 7/29  243 lbs, 7/26  230 lbs.  Ok to increase her Lasix for 3 days? Please advise

## 2020-03-24 NOTE — Telephone Encounter (Signed)
  Herbert Deaner, PT with [facility] would like to report patient is not in any distress, just started medication for what she thinks is gout. Has some redness and swelling. He reports her daily weights as:   8/3    237 lbs 7/29  243 lbs 7/26  230 lbs  Can call Mr Heber [place] with any quesitons

## 2020-03-25 ENCOUNTER — Ambulatory Visit (INDEPENDENT_AMBULATORY_CARE_PROVIDER_SITE_OTHER): Payer: Medicare HMO | Admitting: Pharmacist

## 2020-03-25 DIAGNOSIS — Z7901 Long term (current) use of anticoagulants: Secondary | ICD-10-CM | POA: Diagnosis not present

## 2020-03-25 DIAGNOSIS — I4821 Permanent atrial fibrillation: Secondary | ICD-10-CM | POA: Diagnosis not present

## 2020-03-25 LAB — POCT INR: INR: 1.5 — AB (ref 2.0–3.0)

## 2020-03-26 NOTE — Telephone Encounter (Signed)
Follow up scheduled with dr berry

## 2020-03-28 NOTE — Progress Notes (Signed)
Your procedure is scheduled on Wednesday August 11.  Report to [facility] Main Entrance "A" at 06:30 A.M., and check in at the Admitting office.  Call this number if you have problems the morning of surgery: [phone]  Call [phone] if you have any questions prior to your surgery date Monday-Friday 8am-4pm   Remember: Do not eat or drink after midnight the night before your surgery   Take these medicines the morning of surgery with A SIP OF WATER: esomeprazole (NEXIUM)  fluticasone (FLONASE) timolol (TIMOPTIC) eye drops   If needed: albuterol (VENTOLIN HFA) inhaler ---- Please bring all inhalers with you the day of surgery.  traMADol Veatrice Bourbon)  Follow your surgeon's instructions on when to stop warfarin (COUMADIN).  If no instructions were given by your surgeon then you will need to call the office to get those instructions.    As of today, STOP taking any Aspirin (unless otherwise instructed by your surgeon), Aleve, Naproxen, Ibuprofen, Motrin, Advil, Goody's, BC's, all herbal medications, fish oil, and all vitamins.    The Morning of Surgery  Do not wear jewelry, make-up or nail polish.  Do not wear lotions, powders, or perfumes, or deodorant  Do not shave 48 hours prior to surgery.    Do not bring valuables to the hospital.  [facility] is not responsible for any belongings or valuables.  If you are a smoker, DO NOT Smoke 24 hours prior to surgery  If you wear a CPAP at night please bring your mask the morning of surgery   Remember that you must have someone to transport you home after your surgery, and remain with you for 24 hours if you are discharged the same day.   Please bring cases for contacts, glasses, hearing aids, dentures or bridgework because it cannot be worn into surgery.    Leave your suitcase in the car.  After surgery it may be brought to your room.  For patients admitted to the hospital, discharge time will be determined by your treatment  team.  Patients discharged the day of surgery will not be allowed to drive home.    Special instructions:   [place]- Preparing For Surgery  Before surgery, you can play an important role. Because skin is not sterile, your skin needs to be as free of germs as possible. You can reduce the number of germs on your skin by washing with CHG (chlorahexidine gluconate) Soap before surgery.  CHG is an antiseptic cleaner which kills germs and bonds with the skin to continue killing germs even after washing.    Oral Hygiene is also important to reduce your risk of infection.  Remember - BRUSH YOUR TEETH THE MORNING OF SURGERY WITH YOUR REGULAR TOOTHPASTE  Please do not use if you have an allergy to CHG or antibacterial soaps. If your skin becomes reddened/irritated stop using the CHG.  Do not shave (including legs and underarms) for at least 48 hours prior to first CHG shower. It is OK to shave your face.  Please follow these instructions carefully.   1. Shower the NIGHT BEFORE SURGERY and the MORNING OF SURGERY with CHG Soap.   2. If you chose to wash your hair and body, wash as usual with your normal shampoo and body-wash/soap.  3. Rinse your hair and body thoroughly to remove the shampoo and soap.  4. Apply CHG directly to the skin (ONLY FROM THE NECK DOWN) and wash gently with a scrungie or a clean washcloth.   5.  Do not use on open wounds or open sores. Avoid contact with your eyes, ears, mouth and genitals (private parts). Wash Face and genitals (private parts)  with your normal soap.   6. Wash thoroughly, paying special attention to the area where your surgery will be performed.  7. Thoroughly rinse your body with warm water from the neck down.  8. DO NOT shower/wash with your normal soap after using and rinsing off the CHG Soap.  9. Pat yourself dry with a CLEAN TOWEL.  10. Wear CLEAN PAJAMAS to bed the night before surgery  11. Place CLEAN SHEETS on your bed the night of your  first shower and DO NOT SLEEP WITH PETS.  12. Wear comfortable clothes the morning of surgery.     Day of Surgery:  Please shower the morning of surgery with the CHG soap Do not apply any deodorants/lotions. Please wear clean clothes to the hospital/surgery center.   Remember to brush your teeth WITH YOUR REGULAR TOOTHPASTE.   Please read over the following fact sheets that you were given.

## 2020-03-29 NOTE — Telephone Encounter (Signed)
Yes, that's fine 

## 2020-03-30 ENCOUNTER — Encounter (HOSPITAL_COMMUNITY): Payer: Self-pay | Admitting: Anesthesiology

## 2020-03-30 ENCOUNTER — Encounter (HOSPITAL_COMMUNITY): Payer: Self-pay | Admitting: Vascular Surgery

## 2020-03-30 ENCOUNTER — Inpatient Hospital Stay (HOSPITAL_COMMUNITY)
Admission: RE | Admit: 2020-03-30 | Discharge: 2020-03-30 | Disposition: A | Discharge status: Home / Self Care | Payer: Medicare HMO | Source: Ambulatory Visit

## 2020-03-30 ENCOUNTER — Encounter (HOSPITAL_COMMUNITY): Payer: Self-pay

## 2020-03-30 ENCOUNTER — Other Ambulatory Visit (HOSPITAL_COMMUNITY)
Admission: RE | Admit: 2020-03-30 | Discharge: 2020-03-30 | Disposition: A | Discharge status: Home / Self Care | Payer: Medicare HMO | Source: Ambulatory Visit | Attending: Thoracic Surgery (Cardiothoracic Vascular Surgery) | Admitting: Thoracic Surgery (Cardiothoracic Vascular Surgery)

## 2020-03-30 DIAGNOSIS — U071 COVID-19: Secondary | ICD-10-CM | POA: Insufficient documentation

## 2020-03-30 DIAGNOSIS — R918 Other nonspecific abnormal finding of lung field: Secondary | ICD-10-CM

## 2020-03-30 DIAGNOSIS — R590 Localized enlarged lymph nodes: Secondary | ICD-10-CM | POA: Diagnosis not present

## 2020-03-30 DIAGNOSIS — Z01812 Encounter for preprocedural laboratory examination: Secondary | ICD-10-CM | POA: Diagnosis present

## 2020-03-30 LAB — SARS CORONAVIRUS 2 (TAT 6-24 HRS): SARS Coronavirus 2: POSITIVE — AB

## 2020-03-30 NOTE — Progress Notes (Signed)
Leah Velasquez aware and to make a note to Dr. Roxan Hockey.  Patient did not show for her pre admission appointment today. When I called her she said she was told she did not need to go to her pre-op and only needed to go to her covid test. Unsure who told pt she did not need to come for her pre-op appointment. Pt did go to covid testing site. Instructions for surgery given to patient via phone call.

## 2020-03-30 NOTE — Progress Notes (Signed)
SDW given to pt Surgery is on Wednesday August 11. Report to [facility] Main Entrance "A" at 06:30 A.M., and check in at the Admitting office. Call this number if you have problems the morning of surgery: [phone] Do not eat or drink after midnight the night before your surgery Take these medicines the morning of surgery with A SIP OF WATER: esomeprazole (NEXIUM)  fluticasone (FLONASE) timolol (TIMOPTIC) eye drops If needed: albuterol (VENTOLIN HFA) inhaler ---- Please bring all inhalers with you the day of surgery.  traMADol Veatrice Bourbon) Follow your surgeon's instructions on when to stop warfarin (COUMADIN).  If no instructions were given by your surgeon then you will need to call the office to get those instructions.    As of today, STOP taking any Aspirin (unless otherwise instructed by your surgeon), Aleve, Naproxen, Ibuprofen, Motrin, Advil, Goody's, BC's, all herbal medications, fish oil, and all vitamins. The Morning of Surgery             Do not wear jewelry, make-up or nail polish.             Do not wear lotions, powders, or perfumes, or deodorant             Do not shave 48 hours prior to surgery.               Do not bring valuables to the hospital.             [facility] is not responsible for any belongings or valuables. BRUSH YOUR TEETH THE MORNING OF SURGERY WITH YOUR REGULAR TOOTHPASTE Please wash with dial soap the night before surgery and morning of surgery prior to arrival.   PCP - Suzanna Obey MD Cardiologist - Derryl Harbor MD  Chest x-ray - needed DOS EKG - 02/14/20 Stress Test - per pt, she had one in [place] prior to moving to [facility] - she does not know where she got it done.  ECHO - 10/22/17 Cardiac Cath - pt denies     Blood Thinner Instructions: Follow your surgeon's instructions on when to stop warfarin (COUMADIN).  If no instructions were given by your surgeon then you will need to call the office to get those instructions.   Per pt last dose Coumadin  03/26/20 Aspirin Instructions: n/a    COVID TEST- 03/30/20  Coronavirus Screening  Have you experienced the following symptoms:  Cough yes/no: No Fever (>100.49F)  yes/no: No Runny nose yes/no: No Sore throat yes/no: No Difficulty breathing/shortness of breath  yes/no: No  Have you or a family member traveled in the last 14 days and where? yes/no: No   If the patient indicates "YES" to the above questions, their PAT will be rescheduled to limit the exposure to others and, the surgeon will be notified. THE PATIENT WILL NEED TO BE ASYMPTOMATIC FOR 14 DAYS.   If the patient is not experiencing any of these symptoms, the PAT nurse will instruct them to NOT bring anyone with them to their appointment since they may have these symptoms or traveled as well.   Please remind your patients and families that hospital visitation restrictions are in effect and the importance of the restrictions.     Anesthesia review: cardiac hx  Patient denies shortness of breath, fever, cough and chest pain during phone call.   All instructions explained to the patient, with a verbal understanding of the material. Patient agrees to go over the instructions while at home for a better understanding. Patient also  instructed to self quarantine after being tested for COVID-19. The opportunity to ask questions was provided.

## 2020-03-30 NOTE — Telephone Encounter (Signed)
NO SOB and swelling is much better she did not take Lasix today she will make sure to take tomorrow and discuss Lasix use with surgeon at surgery Wed. She will call if swelling/SOB is noted after that.

## 2020-03-31 ENCOUNTER — Telehealth: Payer: Self-pay

## 2020-03-31 ENCOUNTER — Telehealth (HOSPITAL_COMMUNITY): Payer: Self-pay | Admitting: Oncology

## 2020-03-31 ENCOUNTER — Other Ambulatory Visit: Payer: Self-pay | Admitting: Student

## 2020-03-31 NOTE — Telephone Encounter (Signed)
Patient was scheduled for ENB, EBUS with Dr. Roxan Hockey 04/01/20.  Patient tested positive for COVID 03/30/20.  Patient made aware and surgery postponed for 04/16/20.  Patient aware.  Ride was arranged through [facility] transportation. Patient aware of times for pre-op appointment and surgery.  Patient advised per Dr. Roxan Hockey to restart her Coumadin today and stop 5 days prior to surgery, 8/20.  Patient acknowledged receipt.

## 2020-03-31 NOTE — Telephone Encounter (Signed)
Called to Discuss with patient about Covid symptoms and the use of regeneron, a monoclonal antibody infusion for those with mild to moderate Covid symptoms and at a high risk of hospitalization.     Pt is qualified for this infusion at the [facility] center due to co-morbid conditions and/or a member of an at-risk group.     Spoke to patient about her positive COVID-19 test result.  Testing was due to preadmission testing for biopsy of her lung.  She was recently diagnosed with lung cancer.  Symptom onset started about 6 days ago.  States she would like to think about it and discuss with her husband.  She will call back to set up a time if she agrees.  I discussed in detail that she is very high risk for complications due to her lung cancer diagnosis.   Rulon Abide NP, AGNP-C [phone] ([place])

## 2020-03-31 NOTE — Progress Notes (Signed)
Notified Charlena Cross at Dr. Leonarda Salon office of covid test results.

## 2020-03-31 NOTE — Progress Notes (Signed)
Left message for Levonne Spiller RN about patients positive COVID test result,

## 2020-04-01 ENCOUNTER — Telehealth: Payer: Self-pay | Admitting: *Deleted

## 2020-04-01 NOTE — Telephone Encounter (Signed)
I called to check on Leah Velasquez due to her recent dx of COVID 68.  She sounded good on the phone and no SOB or cough noted.  She states she is just tired a lot.  I scheduled her for a follow up to be seen with Cassie a few days after her bx.  She verbalized understanding of appt.

## 2020-04-06 ENCOUNTER — Telehealth: Payer: Self-pay | Admitting: *Deleted

## 2020-04-06 NOTE — Telephone Encounter (Signed)
I checked on Leah Velasquez schedule. He bx has been changed again so I called to re-schedule her appt with Cassi PA.  Patient verbalized understanding of appt. She states she needs transportation. I notified transportation of her up coming appt with pre op lab, bx, and follow up with Cassie.

## 2020-04-08 ENCOUNTER — Encounter: Payer: Self-pay | Admitting: *Deleted

## 2020-04-08 ENCOUNTER — Ambulatory Visit (INDEPENDENT_AMBULATORY_CARE_PROVIDER_SITE_OTHER): Payer: Medicare HMO | Admitting: Cardiology

## 2020-04-08 DIAGNOSIS — Z7901 Long term (current) use of anticoagulants: Secondary | ICD-10-CM

## 2020-04-08 LAB — POCT INR: INR: 4.2 — AB (ref 2.0–3.0)

## 2020-04-08 NOTE — Progress Notes (Signed)
Anesthesia Chart Review:  Case: [id number] Date/Time: 04/16/20 0745   Procedures:      VIDEO BRONCHOSCOPY WITH ENDOBRONCHIAL NAVIGATION (N/A )     VIDEO BRONCHOSCOPY WITH ENDOBRONCHIAL ULTRASOUND (N/A )   Anesthesia type: General   Pre-op diagnosis:      RUL LUNG MASS     MEDIASTINAL ADENOPATHY   Location: [facility] OR ROOM 10 / [place] OR   Surgeons: [name], MD      DISCUSSION: Patient is a 79 year old female scheduled for the above procedure. Surgery was initially scheduled for 04/01/20, but was delayed due to positive preoperative COVID-19 test on 03/30/20. She was offered Regeneron, but she declined.   PAT is scheduled for 04/13/20. Given recent + COVID-19 test, I called and spoke with her on 04/09/20. She continues to feel fatigued but otherwise no cough, fever or SOB. She and her husband had their second COVID-19 vaccines around June 2021. He did not test positive for COVID-19. She denied any new CV symptoms. She can't usually tell that she is in afib. A HHRN comes out and has not mentioned any concerns about her HR/BP or exam findings. She said her O2 sats have been > 94%. She does have some chronic ankle/pedal edema, but thinks this is at least partly due to not always being compliant with her diuretic regimen. She has on-going weakness and by the time she gets the urge to urinate and is able to get out of bed to the bathroom she often will have urinary urgency with incontinence creating a mess to clean up. She is able to stand and walk on her own to the bathroom. This does not cause dyspnea. She denied chest pain. Reports appetite still not great, but is eating regularly, just not as much as usual.    Surgery has been posted > 14 days out from positive test. She has had mild, possibly asymptomatic infection. She does have RA but has not been on medication for this for > 30 days. Timing of surgery discussed with surgical short stay assistant director, Renold Don, MD and message sent to Dr.  Roxan Hockey. Oncology RN navigator is asking for transportation services to reach out to patient at make arrangements for upcoming PAT and surgery visits.  History includes never smoker, afib (s/p failed DCCV 01/09/18, successful 03/01/18, but recurrent afib by 04/18/18), HLD, HTN, RA, lung cancer (s/p RL lobectomy 2000 [facility])  - ED visit 03/11/20 for weakness, poor appetite, fall/near syncope while getting off of the toilet. She did not want to go to SNF rehab facility. Home health therapies and hospital bed arranged.   - Admitted 02/14/20-02/20/20 for weakness and falls with paresthesias in lower extremities. 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  MRI lumbar and thoracic spine did not show acute findings to definitively explain symptoms, but did show an incidental RUL mass. CT of the chest revealed a right apical and right suprahilar lung mass with satellite lung nodules. S/p bronchoscopy/FNB on 02/18/20. By notes, there was insufficient tissue to differentiate between small cell carcinoma and non-small cell (large cell neuroendocrine). Oncologist Dr. Julien Nordmann referred for another biopsy in hopes to get definitive tissue diagnosis that can guide treatment. Chemoradiation is anticipated. B12 was low at 173, so supplementation started for neuropathy. Jolee Ewing was also placed on hold with plan to follow-up with rheumatology when/if to resume.   Per surgeon, she will need to hold warfarin x 5 days per surgeon.  VS:   Wt Readings from Last 3 Encounters:  03/23/20 104.8 kg  03/11/20 105 kg  03/11/20 105 kg   BP Readings from Last 3 Encounters:  03/23/20 140/84  03/12/20 (!) 140/90  03/11/20 105/69   Pulse Readings from Last 3 Encounters:  03/23/20 86  03/12/20 89  03/11/20 83    PROVIDERS: [name], MD is PCP ([facility] Everywhere) Curt Bears, MD is HEM-ONC Gery Pray, MD is RAD-ONC Gavin Pound, MD is  rheumatologist. Patient reports next visit is scheduled next month.  Baltazar Apo, MD is pulmonologist Quay Burow, MD is cardiologist. Last evaluation 01/01/19. She had short lived SR after DCCV, so continued goal of rate control as opposed to rhythm control. No chest pain or SOB. Subsequent encounters have been regarding warfarin management. She has follow-up scheduled for 04/22/20.    LABS: PENDING PAT visit. (all labs ordered are listed, but only abnormal results are displayed)  Labs Reviewed - No data to display   IMAGES: PET Scan 03/10/20: IMPRESSION: 1. FDG avid lesions within the paramediastinal aspect of the right apex and right upper lobe concerning for primary bronchogenic carcinoma. 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 2. Single subcentimeter lymph node within the right lower quadrant small bowel mesentery is FDG avid within SUV max of 7.9. This is of uncertain clinical significance and would be an unusual focus of metastasis given absence of disease elsewhere in the abdomen and pelvis. 3. Small focus of moderate uptake localizing to the sacrococcygeal junction is favored to represent arthropathic changes. No additional foci of increased tracer activity identified within the imaged portions of the axial and appendicular skeleton. 4. Aortic Atherosclerosis (ICD10-I70.0). Coronary artery calcifications.  1V PCXR 02/20/20: IMPRESSION: Improving right basilar opacity with mild residual right base atelectasis or infiltrate.   EKG: 02/14/20:  Atrial fibrillation at 85 bpm Ventricular premature complex Abnormal R-wave progression, early transition Borderline T abnormalities, anterior leads Confirmed by Noemi Chapel [phone]) on 02/16/2020 10:37:32 AM   CV: Echo 10/22/17: Study Conclusions  - Left ventricle: The cavity size was normal. There was mild  concentric hypertrophy. Systolic function was normal. The  estimated  ejection fraction was in the range of 60% to 65%. Wall  motion was normal; there were no regional wall motion  abnormalities.  - Aortic valve: There was no regurgitation.  - Mitral valve: There was trivial regurgitation.  - Right ventricle: Systolic function was normal.  - Right atrium: The atrium was normal in size.  - Tricuspid valve: There was no regurgitation.  - Pulmonary arteries: Systolic pressure could not be accurately  estimated.  - Inferior vena cava: The vessel was normal in size.  - Pericardium, extracardiac: There was no pericardial effusion.    Past Medical History:  Diagnosis Date  . A-fib ([place])   . Hemorrhoids   . Hyperlipidemia   . Hypertension   . Lung cancer ([place]) 2000   RLL  . Obesity   . Persistent atrial fibrillation ([place])   . Rheumatoid arthritis [facility])     Past Surgical History:  Procedure Laterality Date  . BREAST EXCISIONAL BIOPSY Left    x 2  . BREAST EXCISIONAL BIOPSY Right   . BRONCHIAL BIOPSY  02/18/2020   Procedure: BRONCHIAL BIOPSIES;  Surgeon: Collene Gobble, MD;  Location: [facility] ENDOSCOPY;  Service: Cardiopulmonary;;  . BRONCHIAL BRUSHINGS  02/18/2020   Procedure: BRONCHIAL BRUSHINGS;  Surgeon: Collene Gobble, MD;  Location: [facility] ENDOSCOPY;  Service: Cardiopulmonary;;  .  BRONCHIAL NEEDLE ASPIRATION BIOPSY  02/18/2020   Procedure: BRONCHIAL NEEDLE ASPIRATION BIOPSIES;  Surgeon: Collene Gobble, MD;  Location: [place];  Service: Cardiopulmonary;;  . BRONCHIAL WASHINGS  02/18/2020   Procedure: BRONCHIAL WASHINGS;  Surgeon: Collene Gobble, MD;  Location: [place];  Service: Cardiopulmonary;;  . CARDIOVERSION N/A 01/09/2018   Procedure: CARDIOVERSION;  Surgeon: Josue Hector, MD;  Location: [facility] ENDOSCOPY;  Service: Cardiovascular;  Laterality: N/A;  . CARDIOVERSION N/A 03/01/2018   Procedure: CARDIOVERSION;  Surgeon: Sueanne Margarita, MD;  Location: [facility] ENDOSCOPY;  Service: Cardiovascular;  Laterality: N/A;  . ELECTROMAGNETIC NAVIGATION  BROCHOSCOPY N/A 02/18/2020   Procedure: ELECTROMAGNETIC NAVIGATION BRONCHOSCOPY;  Surgeon: Collene Gobble, MD;  Location: [place];  Service: Cardiopulmonary;  Laterality: N/A;  . KNEE SURGERY    . LUNG LOBECTOMY Right 2000   RLL removed  . VIDEO BRONCHOSCOPY N/A 02/18/2020   Procedure: VIDEO BRONCHOSCOPY WITH FLUORO;  Surgeon: Collene Gobble, MD;  Location[facility] ENDOSCOPY;  Service: Cardiopulmonary;  Laterality: N/A;    MEDICATIONS: . albuterol (VENTOLIN HFA) 108 (90 Base) MCG/ACT inhaler  . calcium carbonate (TUMS - DOSED IN MG ELEMENTAL CALCIUM) 500 MG chewable tablet  . Cholecalciferol (VITAMIN D3) 1000 units CAPS  . conjugated estrogens (PREMARIN) vaginal cream  . esomeprazole (NEXIUM) 20 MG packet  . fluticasone (FLONASE) 50 MCG/ACT nasal spray  . furosemide (LASIX) 40 MG tablet  . hydrocortisone (ANUSOL-HC) 2.5 % rectal cream  . levocetirizine (XYZAL) 5 MG tablet  . lidocaine (LIDODERM) 5 %  . losartan (COZAAR) 50 MG tablet  . metoprolol succinate (TOPROL XL) 25 MG 24 hr tablet  . simvastatin (ZOCOR) 20 MG tablet  . timolol (TIMOPTIC) 0.5 % ophthalmic solution  . traMADol (ULTRAM) 50 MG tablet  . vitamin B-12 1000 MCG tablet  . warfarin (COUMADIN) 5 MG tablet  . zolpidem (AMBIEN) 5 MG tablet   No current facility-administered medications for this encounter.    Myra Gianotti, PA-C Surgical Short Stay/Anesthesiology [facility] Phone [phone] [facility] Phone [phone] 04/09/2020 5:30 PM

## 2020-04-08 NOTE — Progress Notes (Signed)
Due to Ms. Smthye needing transportation to appts, I completed transportation documentation and emailed back to them. I updated TCTS office and they said they had called transportation for assistance as well.

## 2020-04-08 NOTE — Progress Notes (Signed)
I received a message from transportation services that they are trying to arrange her transportation.  They called but was unable to reach Ms. Helin but did leave VM message.  I called and spoke to Mr. Lajara and asked him to have his wife listen to the VM message and to all transportation back. He verbalized understanding.  I then updated transportation service that I spoke to husband and that the patient has been instructed to call them.

## 2020-04-09 ENCOUNTER — Encounter: Payer: Self-pay | Admitting: *Deleted

## 2020-04-09 NOTE — Progress Notes (Signed)
I received a message from short stay that Leah Velasquez is confused about her transportation and was ask that I contact transportation services.  I contacted them and asked that they call Leah Velasquez to update her.

## 2020-04-13 ENCOUNTER — Inpatient Hospital Stay (HOSPITAL_COMMUNITY)
Admission: RE | Admit: 2020-04-13 | Discharge: 2020-04-13 | Disposition: A | Discharge status: Home / Self Care | Payer: Medicare HMO | Source: Ambulatory Visit

## 2020-04-13 ENCOUNTER — Telehealth: Payer: Self-pay | Admitting: Medical Oncology

## 2020-04-13 ENCOUNTER — Telehealth: Payer: Self-pay | Admitting: Cardiovascular Disease

## 2020-04-13 DIAGNOSIS — E782 Mixed hyperlipidemia: Secondary | ICD-10-CM

## 2020-04-13 NOTE — Progress Notes (Signed)
Your procedure is scheduled on Thursday 04/16/2020.  Report to [facility] Main Entrance "A" at 06:00 A.M., and check in at the Admitting office.  Call this number if you have problems the morning of surgery: [phone]  Call [phone] if you have any questions prior to your surgery date Monday-Friday 8am-4pm   Remember: Do not eat or drink after midnight the night before your surgery   Take these medicines the morning of surgery with A SIP OF WATER: esomeprazole (NEXIUM)  fluticasone (FLONASE) timolol (TIMOPTIC) eye drops   If needed: albuterol (VENTOLIN HFA) inhaler ---- Please bring all inhalers with you the day of surgery.  traMADol Veatrice Bourbon)  Follow your surgeon's instructions on when to stop warfarin (COUMADIN).  If no instructions were given by your surgeon then you will need to call the office to get those instructions.    As of today, STOP taking any Aspirin (unless otherwise instructed by your surgeon), Aleve, Naproxen, Ibuprofen, Motrin, Advil, Goody's, BC's, all herbal medications, fish oil, and all vitamins.    The Morning of Surgery  Do not wear jewelry, make-up or nail polish.  Do not wear lotions, powders, or perfumes, or deodorant  Do not shave 48 hours prior to surgery.    Do not bring valuables to the hospital.  [facility] is not responsible for any belongings or valuables.  If you are a smoker, DO NOT Smoke 24 hours prior to surgery  If you wear a CPAP at night please bring your mask the morning of surgery   Remember that you must have someone to transport you home after your surgery, and remain with you for 24 hours if you are discharged the same day.   Please bring cases for contacts, glasses, hearing aids, dentures or bridgework because it cannot be worn into surgery.    Leave your suitcase in the car.  After surgery it may be brought to your room.  For patients admitted to the hospital, discharge time will be determined by your treatment  team.  Patients discharged the day of surgery will not be allowed to drive home.    Special instructions:   [place]- Preparing For Surgery  Before surgery, you can play an important role. Because skin is not sterile, your skin needs to be as free of germs as possible. You can reduce the number of germs on your skin by washing with CHG (chlorahexidine gluconate) Soap before surgery.  CHG is an antiseptic cleaner which kills germs and bonds with the skin to continue killing germs even after washing.    Oral Hygiene is also important to reduce your risk of infection.  Remember - BRUSH YOUR TEETH THE MORNING OF SURGERY WITH YOUR REGULAR TOOTHPASTE  Please do not use if you have an allergy to CHG or antibacterial soaps. If your skin becomes reddened/irritated stop using the CHG.  Do not shave (including legs and underarms) for at least 48 hours prior to first CHG shower. It is OK to shave your face.  Please follow these instructions carefully.   1. Shower the NIGHT BEFORE SURGERY and the MORNING OF SURGERY with CHG Soap.   2. If you chose to wash your hair and body, wash as usual with your normal shampoo and body-wash/soap.  3. Rinse your hair and body thoroughly to remove the shampoo and soap.  4. Apply CHG directly to the skin (ONLY FROM THE NECK DOWN) and wash gently with a scrungie or a clean washcloth.   5. Do  not use on open wounds or open sores. Avoid contact with your eyes, ears, mouth and genitals (private parts). Wash Face and genitals (private parts)  with your normal soap.   6. Wash thoroughly, paying special attention to the area where your surgery will be performed.  7. Thoroughly rinse your body with warm water from the neck down.  8. DO NOT shower/wash with your normal soap after using and rinsing off the CHG Soap.  9. Pat yourself dry with a CLEAN TOWEL.  10. Wear CLEAN PAJAMAS to bed the night before surgery  11. Place CLEAN SHEETS on your bed the night of your  first shower and DO NOT SLEEP WITH PETS.  12. Wear comfortable clothes the morning of surgery.     Day of Surgery:  Please shower the morning of surgery with the CHG soap Do not apply any deodorants/lotions. Please wear clean clothes to the hospital/surgery center.   Remember to brush your teeth WITH YOUR REGULAR TOOTHPASTE.   Please read over the following fact sheets that you were given.

## 2020-04-13 NOTE — Telephone Encounter (Signed)
Leah Velasquez called to reschedule her appt with Dr. Gwenlyn Found today. She is wanting to clarify with a nurse if Dr. Gwenlyn Found is wanting any labs or test performed for this appt. Please advise.

## 2020-04-13 NOTE — Telephone Encounter (Signed)
Pt needs to r/s her CT biopsy. Hinton Dyer will f/u with Dr Roxan Hockey.

## 2020-04-13 NOTE — Telephone Encounter (Signed)
A FLP in advance should be fine

## 2020-04-14 ENCOUNTER — Other Ambulatory Visit: Payer: Self-pay | Admitting: *Deleted

## 2020-04-14 ENCOUNTER — Encounter: Payer: Self-pay | Admitting: *Deleted

## 2020-04-14 ENCOUNTER — Telehealth: Payer: Self-pay | Admitting: Pharmacist

## 2020-04-14 DIAGNOSIS — R590 Localized enlarged lymph nodes: Secondary | ICD-10-CM

## 2020-04-14 DIAGNOSIS — R918 Other nonspecific abnormal finding of lung field: Secondary | ICD-10-CM

## 2020-04-14 NOTE — Telephone Encounter (Signed)
The patient has been made aware. Orders placed.

## 2020-04-14 NOTE — Telephone Encounter (Signed)
Home health called, patient's surgery was postponed until 9/2.  Patient is to hold warfarin 5 days prior to surgery.  Advised home health to continue warfarin 2.5 mg daily except 5 mg on Friday until 8/28

## 2020-04-15 ENCOUNTER — Other Ambulatory Visit: Payer: Self-pay | Admitting: Student

## 2020-04-15 ENCOUNTER — Ambulatory Visit: Payer: Medicare HMO | Admitting: Physician Assistant

## 2020-04-16 ENCOUNTER — Telehealth: Payer: Self-pay | Admitting: Internal Medicine

## 2020-04-16 ENCOUNTER — Encounter: Payer: Self-pay | Admitting: *Deleted

## 2020-04-16 ENCOUNTER — Emergency Department (HOSPITAL_COMMUNITY): Payer: Medicare Other

## 2020-04-16 ENCOUNTER — Encounter (HOSPITAL_COMMUNITY): Payer: Self-pay | Admitting: Emergency Medicine

## 2020-04-16 ENCOUNTER — Inpatient Hospital Stay (HOSPITAL_COMMUNITY)
Admission: EM | Admit: 2020-04-16 | Discharge: 2020-04-23 | DRG: 167 | Disposition: A | Discharge status: Skilled Nursing Facility | Payer: Medicare Other | Attending: Internal Medicine | Admitting: Internal Medicine

## 2020-04-16 ENCOUNTER — Other Ambulatory Visit: Payer: Self-pay

## 2020-04-16 DIAGNOSIS — E860 Dehydration: Secondary | ICD-10-CM | POA: Diagnosis present

## 2020-04-16 DIAGNOSIS — E876 Hypokalemia: Secondary | ICD-10-CM | POA: Diagnosis not present

## 2020-04-16 DIAGNOSIS — R590 Localized enlarged lymph nodes: Secondary | ICD-10-CM | POA: Diagnosis present

## 2020-04-16 DIAGNOSIS — Z87891 Personal history of nicotine dependence: Secondary | ICD-10-CM

## 2020-04-16 DIAGNOSIS — Z6841 Body Mass Index (BMI) 40.0 and over, adult: Secondary | ICD-10-CM | POA: Diagnosis not present

## 2020-04-16 DIAGNOSIS — R531 Weakness: Secondary | ICD-10-CM | POA: Diagnosis present

## 2020-04-16 DIAGNOSIS — M6282 Rhabdomyolysis: Secondary | ICD-10-CM | POA: Diagnosis not present

## 2020-04-16 DIAGNOSIS — R680 Hypothermia, not associated with low environmental temperature: Secondary | ICD-10-CM | POA: Diagnosis present

## 2020-04-16 DIAGNOSIS — G3184 Mild cognitive impairment, so stated: Secondary | ICD-10-CM | POA: Diagnosis present

## 2020-04-16 DIAGNOSIS — N179 Acute kidney failure, unspecified: Secondary | ICD-10-CM | POA: Diagnosis present

## 2020-04-16 DIAGNOSIS — Z993 Dependence on wheelchair: Secondary | ICD-10-CM

## 2020-04-16 DIAGNOSIS — I5032 Chronic diastolic (congestive) heart failure: Secondary | ICD-10-CM | POA: Diagnosis present

## 2020-04-16 DIAGNOSIS — Z79899 Other long term (current) drug therapy: Secondary | ICD-10-CM

## 2020-04-16 DIAGNOSIS — N952 Postmenopausal atrophic vaginitis: Secondary | ICD-10-CM | POA: Diagnosis present

## 2020-04-16 DIAGNOSIS — E538 Deficiency of other specified B group vitamins: Secondary | ICD-10-CM | POA: Diagnosis present

## 2020-04-16 DIAGNOSIS — R3915 Urgency of urination: Secondary | ICD-10-CM | POA: Diagnosis not present

## 2020-04-16 DIAGNOSIS — R42 Dizziness and giddiness: Secondary | ICD-10-CM | POA: Diagnosis not present

## 2020-04-16 DIAGNOSIS — Z20822 Contact with and (suspected) exposure to covid-19: Secondary | ICD-10-CM | POA: Diagnosis present

## 2020-04-16 DIAGNOSIS — K529 Noninfective gastroenteritis and colitis, unspecified: Secondary | ICD-10-CM | POA: Diagnosis present

## 2020-04-16 DIAGNOSIS — I2510 Atherosclerotic heart disease of native coronary artery without angina pectoris: Secondary | ICD-10-CM | POA: Diagnosis present

## 2020-04-16 DIAGNOSIS — M069 Rheumatoid arthritis, unspecified: Secondary | ICD-10-CM | POA: Diagnosis present

## 2020-04-16 DIAGNOSIS — Z79891 Long term (current) use of opiate analgesic: Secondary | ICD-10-CM

## 2020-04-16 DIAGNOSIS — C3411 Malignant neoplasm of upper lobe, right bronchus or lung: Principal | ICD-10-CM | POA: Diagnosis present

## 2020-04-16 DIAGNOSIS — I4819 Other persistent atrial fibrillation: Secondary | ICD-10-CM | POA: Diagnosis present

## 2020-04-16 DIAGNOSIS — Z8616 Personal history of COVID-19: Secondary | ICD-10-CM

## 2020-04-16 DIAGNOSIS — Z419 Encounter for procedure for purposes other than remedying health state, unspecified: Secondary | ICD-10-CM

## 2020-04-16 DIAGNOSIS — Z85118 Personal history of other malignant neoplasm of bronchus and lung: Secondary | ICD-10-CM

## 2020-04-16 DIAGNOSIS — E785 Hyperlipidemia, unspecified: Secondary | ICD-10-CM | POA: Diagnosis present

## 2020-04-16 DIAGNOSIS — I110 Hypertensive heart disease with heart failure: Secondary | ICD-10-CM | POA: Diagnosis not present

## 2020-04-16 DIAGNOSIS — Z902 Acquired absence of lung [part of]: Secondary | ICD-10-CM

## 2020-04-16 DIAGNOSIS — R079 Chest pain, unspecified: Secondary | ICD-10-CM

## 2020-04-16 DIAGNOSIS — Z9114 Patient's other noncompliance with medication regimen: Secondary | ICD-10-CM

## 2020-04-16 DIAGNOSIS — Z7901 Long term (current) use of anticoagulants: Secondary | ICD-10-CM

## 2020-04-16 HISTORY — DX: Cardiac arrhythmia, unspecified: I49.9

## 2020-04-16 HISTORY — DX: Gastro-esophageal reflux disease without esophagitis: K21.9

## 2020-04-16 LAB — URINALYSIS, ROUTINE W REFLEX MICROSCOPIC
Bilirubin Urine: NEGATIVE
Glucose, UA: NEGATIVE mg/dL
Ketones, ur: NEGATIVE mg/dL
Nitrite: NEGATIVE
Protein, ur: 30 mg/dL — AB
Specific Gravity, Urine: 1.01 (ref 1.005–1.030)
pH: 7 (ref 5.0–8.0)

## 2020-04-16 LAB — CBG MONITORING, ED: Glucose-Capillary: 84 mg/dL (ref 70–99)

## 2020-04-16 LAB — CBC
HCT: 48 % — ABNORMAL HIGH (ref 36.0–46.0)
Hemoglobin: 15.4 g/dL — ABNORMAL HIGH (ref 12.0–15.0)
MCH: 31.4 pg (ref 26.0–34.0)
MCHC: 32.1 g/dL (ref 30.0–36.0)
MCV: 98 fL (ref 80.0–100.0)
Platelets: 210 10*3/uL (ref 150–400)
RBC: 4.9 MIL/uL (ref 3.87–5.11)
RDW: 14.6 % (ref 11.5–15.5)
WBC: 10.7 10*3/uL — ABNORMAL HIGH (ref 4.0–10.5)
nRBC: 0 % (ref 0.0–0.2)

## 2020-04-16 LAB — COMPREHENSIVE METABOLIC PANEL
ALT: 26 U/L (ref 0–44)
AST: 49 U/L — ABNORMAL HIGH (ref 15–41)
Albumin: 3.4 g/dL — ABNORMAL LOW (ref 3.5–5.0)
Alkaline Phosphatase: 99 U/L (ref 38–126)
Anion gap: 18 — ABNORMAL HIGH (ref 5–15)
BUN: 12 mg/dL (ref 8–23)
CO2: 20 mmol/L — ABNORMAL LOW (ref 22–32)
Calcium: 9.6 mg/dL (ref 8.9–10.3)
Chloride: 102 mmol/L (ref 98–111)
Creatinine, Ser: 1.36 mg/dL — ABNORMAL HIGH (ref 0.44–1.00)
GFR calc Af Amer: 43 mL/min — ABNORMAL LOW (ref >60)
GFR calc non Af Amer: 37 mL/min — ABNORMAL LOW (ref >60)
Glucose, Bld: 104 mg/dL — ABNORMAL HIGH (ref 70–99)
Potassium: 3.3 mmol/L — ABNORMAL LOW (ref 3.5–5.1)
Sodium: 140 mmol/L (ref 135–145)
Total Bilirubin: 1.2 mg/dL (ref 0.3–1.2)
Total Protein: 7 g/dL (ref 6.5–8.1)

## 2020-04-16 LAB — BRAIN NATRIURETIC PEPTIDE: B Natriuretic Peptide: 178.5 pg/mL — ABNORMAL HIGH (ref 0.0–100.0)

## 2020-04-16 LAB — PROTIME-INR
INR: 1.5 — ABNORMAL HIGH (ref 0.8–1.2)
Prothrombin Time: 17.2 seconds — ABNORMAL HIGH (ref 11.4–15.2)

## 2020-04-16 LAB — TROPONIN I (HIGH SENSITIVITY)
Troponin I (High Sensitivity): 37 ng/L — ABNORMAL HIGH (ref <18)
Troponin I (High Sensitivity): 31 ng/L — ABNORMAL HIGH (ref <18)

## 2020-04-16 LAB — SARS CORONAVIRUS 2 BY RT PCR (HOSPITAL ORDER, PERFORMED IN ~~LOC~~ HOSPITAL LAB): SARS Coronavirus 2: NEGATIVE

## 2020-04-16 LAB — CK: Total CK: 1732 U/L — ABNORMAL HIGH (ref 38–234)

## 2020-04-16 LAB — VITAMIN B12: Vitamin B-12: 879 pg/mL (ref 180–914)

## 2020-04-16 IMAGING — DX DG CHEST 2V
2 series · 2 of 2 positions shown · non-contrast
Comparison: Multiple priors, including chest radiograph from
[DATE] and PET-CT from [DATE]

CLINICAL DATA: Fell last night.  Low back pain.

EXAM:
CHEST - 2 VIEW

[chest lat]
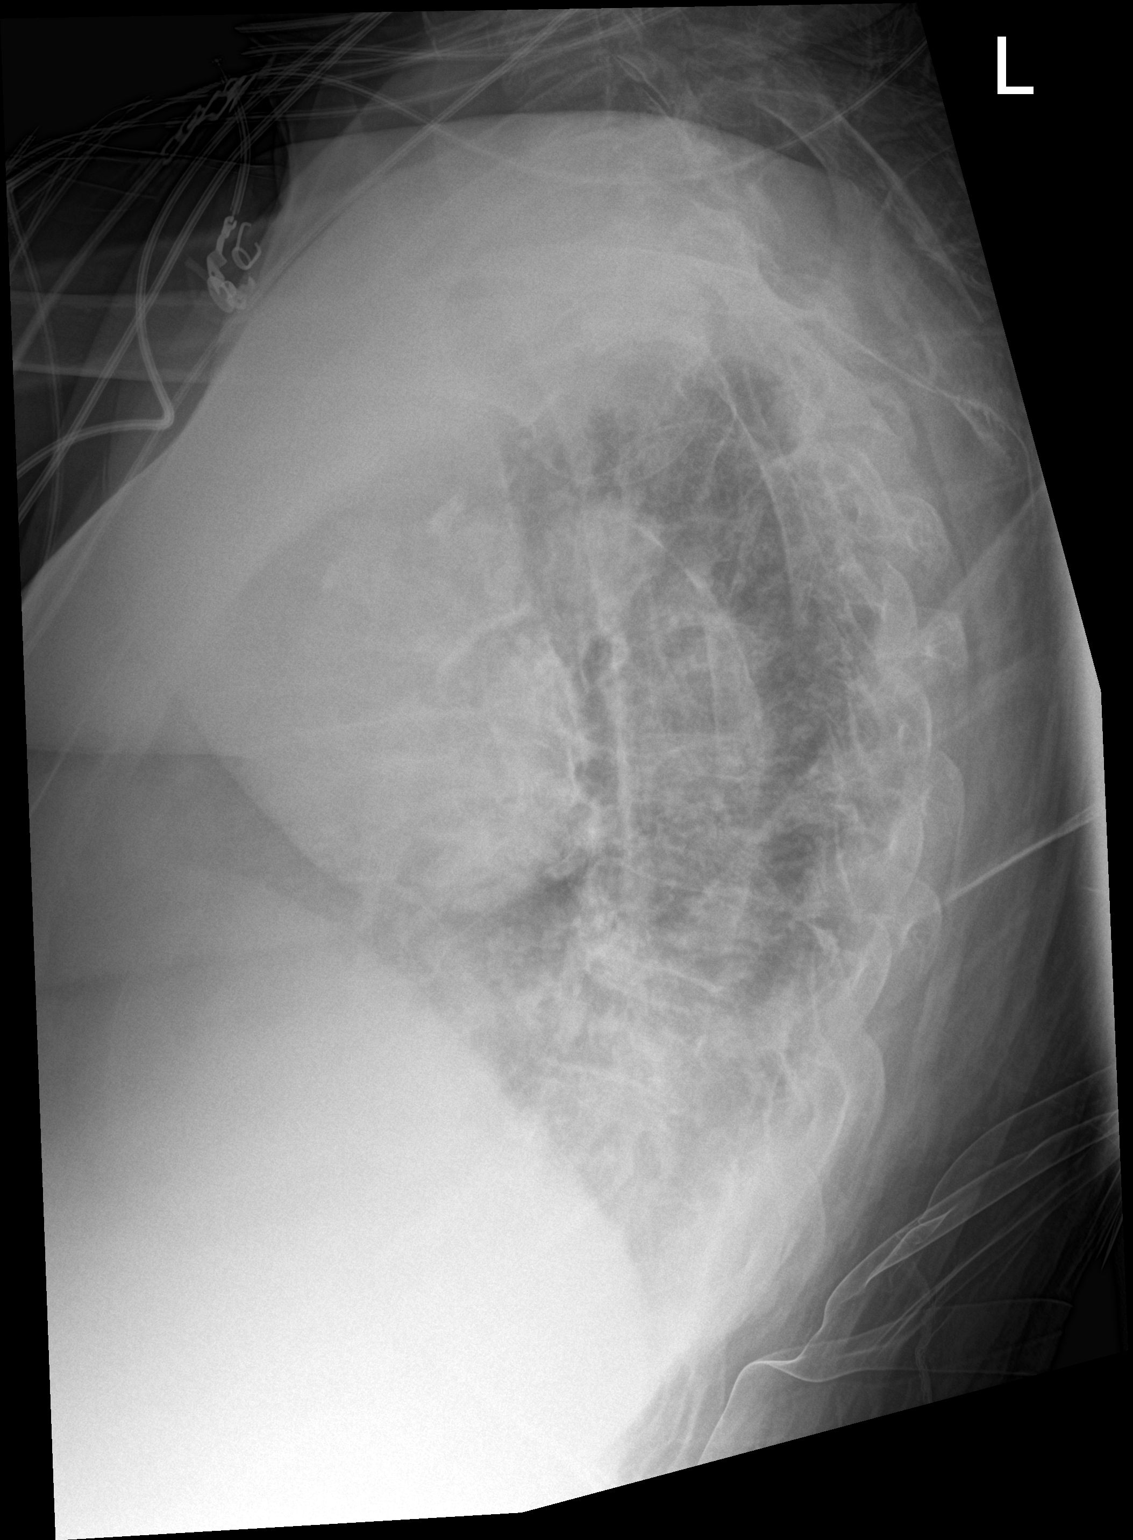

[chest ap]
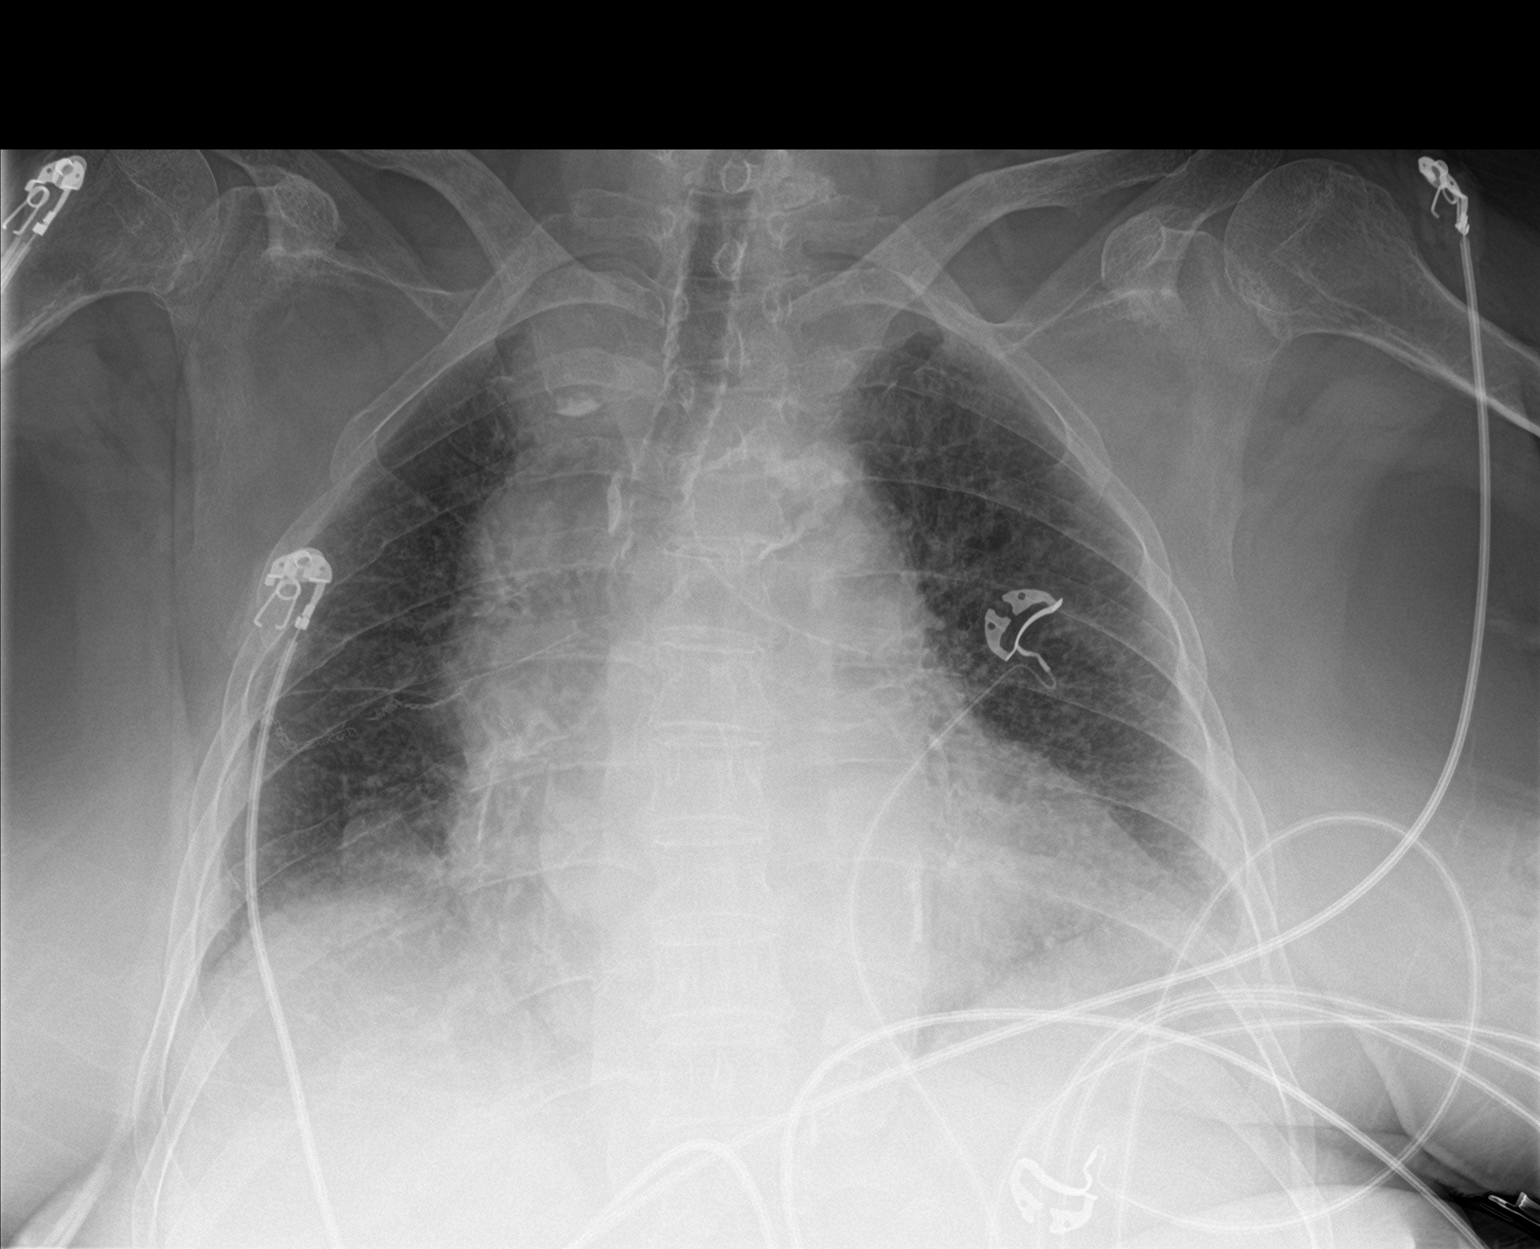

[2 of 2 positions shown; findings below may reference images not displayed]

FINDINGS: Prominence of the right paratracheal stripe with outwardly convex
contour, likely secondary to known paramediastinal and right upper
lobe lesions, better characterized on prior PET-CT. New diffuse
interstitial prominence. Low lung volumes without focal
consolidation. No discernible pneumothorax. No definite pleural
effusions. Mild-to-moderate enlargement of the cardiopericardial
silhouette, increased from prior. Polyarticular degenerative change.
Calcific atherosclerosis of the aorta. Multiple remote right rib
fractures without evidence of acute osseous abnormality.
IMPRESSION: 1. Cardiomegaly with new diffuse interstitial prominence, suggestive
of mild interstitial pulmonary edema.
2. Prominence of the right paratracheal stripe, likely secondary to
known paramediastinal and right upper lobe lesions, better
characterized on prior PET-CT.

## 2020-04-16 IMAGING — DX DG LUMBAR SPINE COMPLETE 4+V
5 series · 5 of 5 positions shown · non-contrast
Comparison: MRI lumbar spine [DATE]

CLINICAL DATA: Left low back pain.  Fall.

EXAM:
LUMBAR SPINE - COMPLETE 4+ VIEW

[l-spine ap]
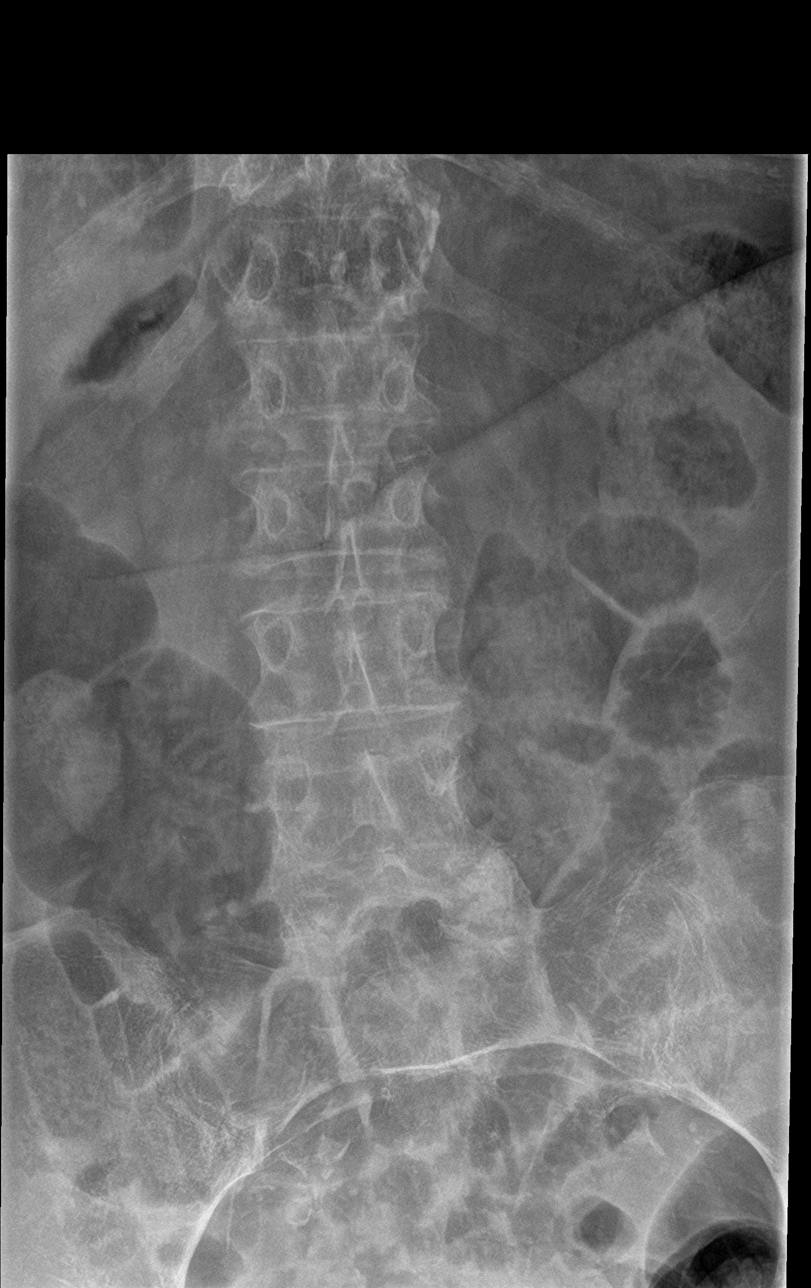

[l-spine obl (1 of 2)]
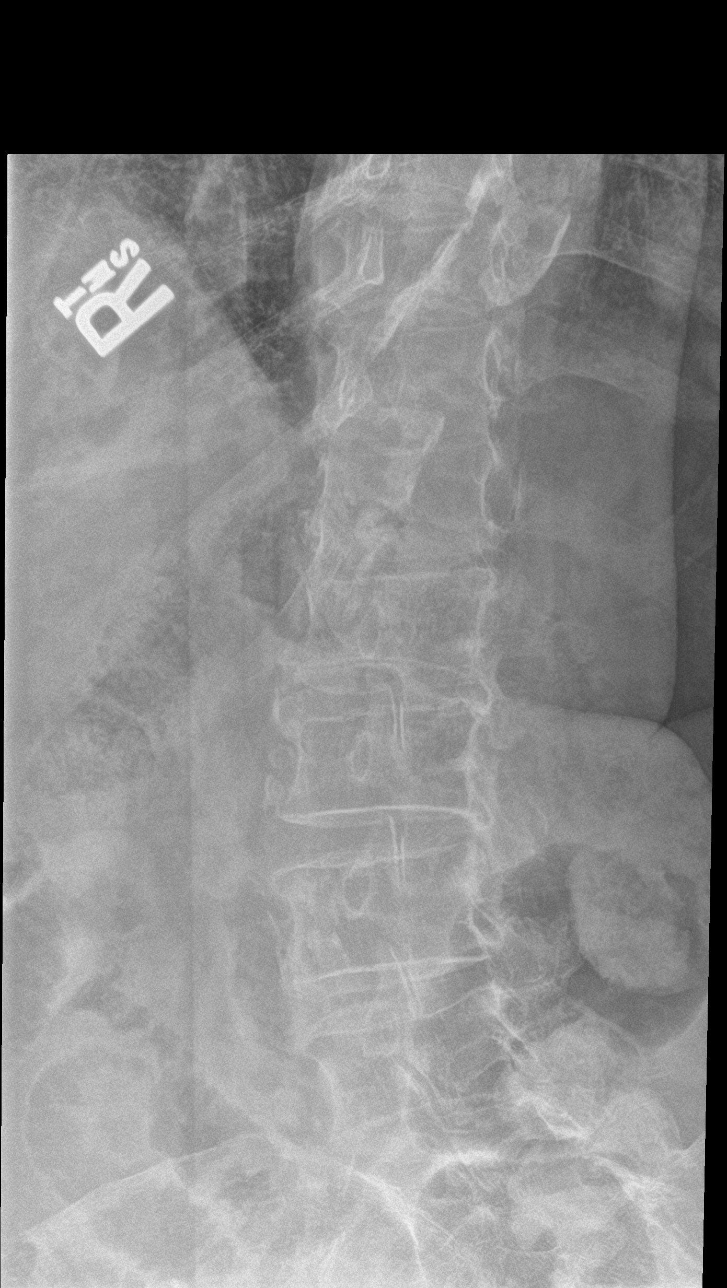

[l-spine obl (2 of 2)]
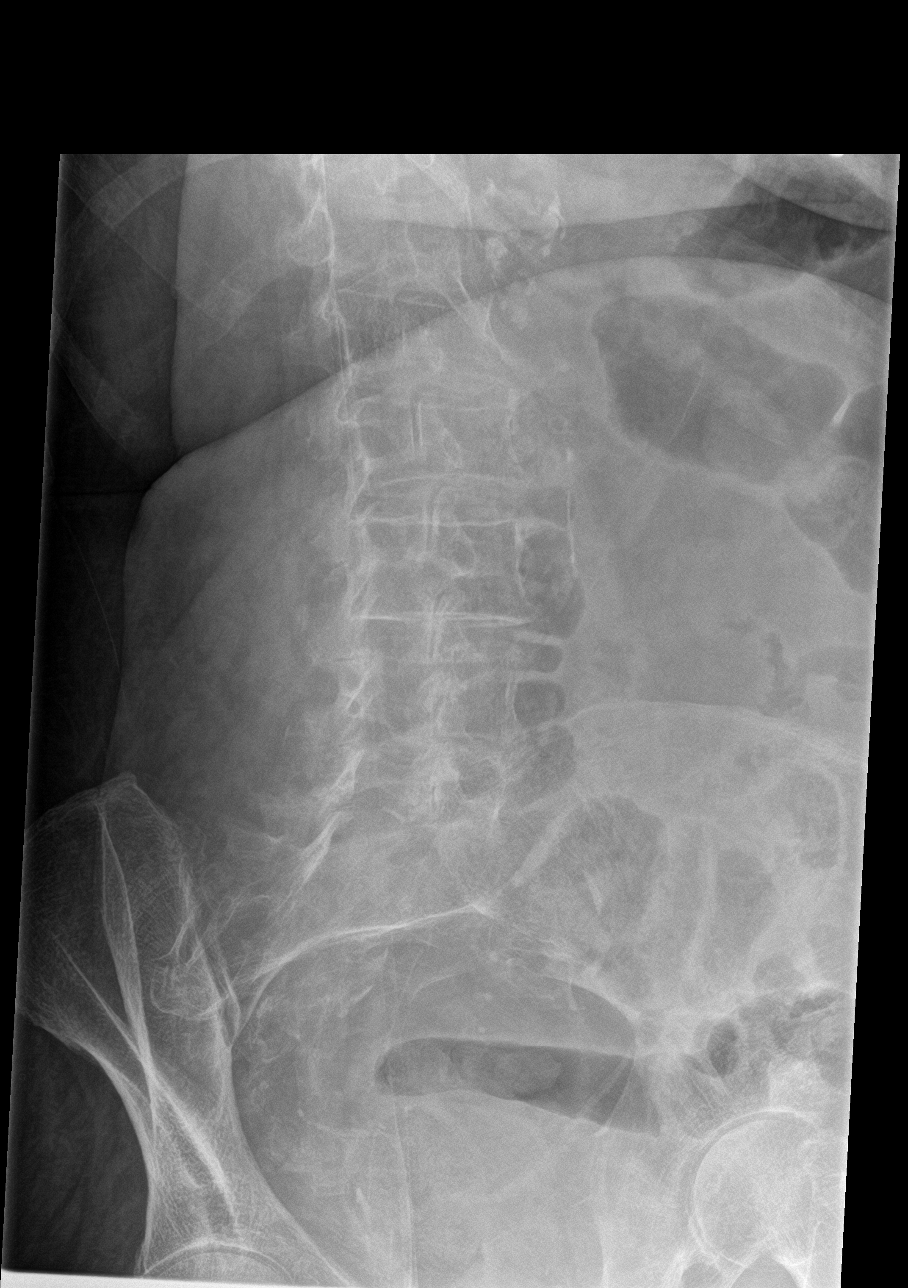

[l-spine lat]
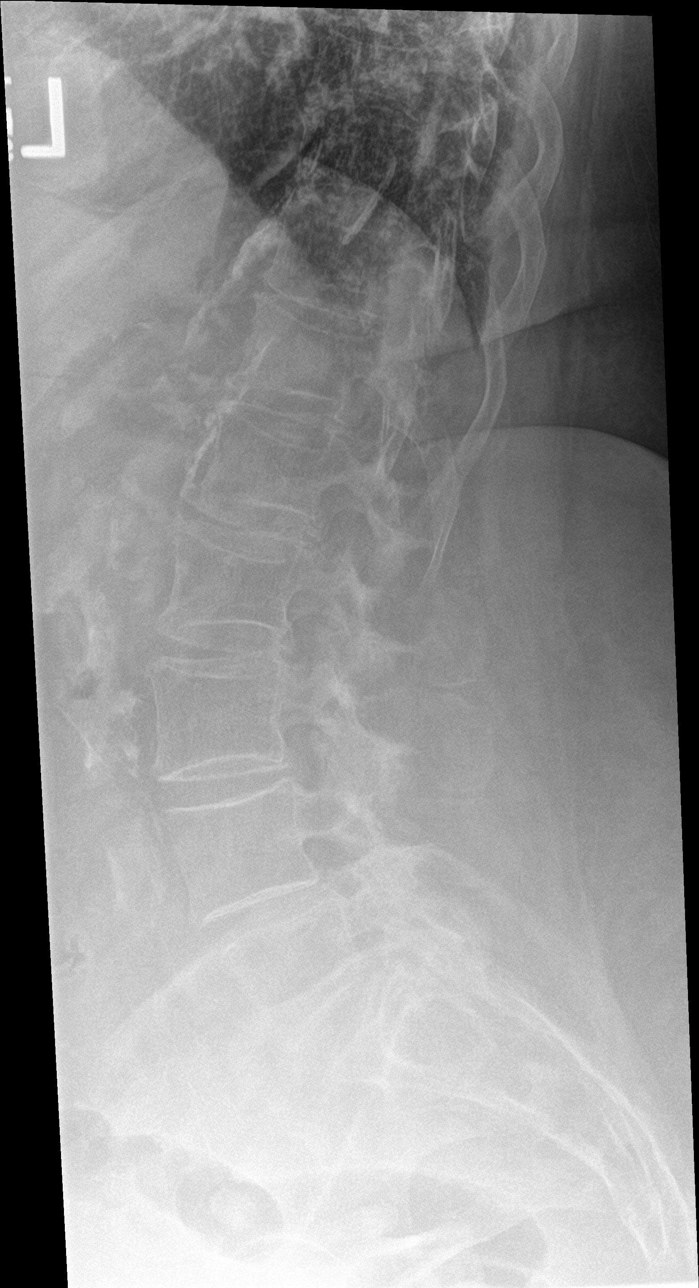

[l-spine spot]
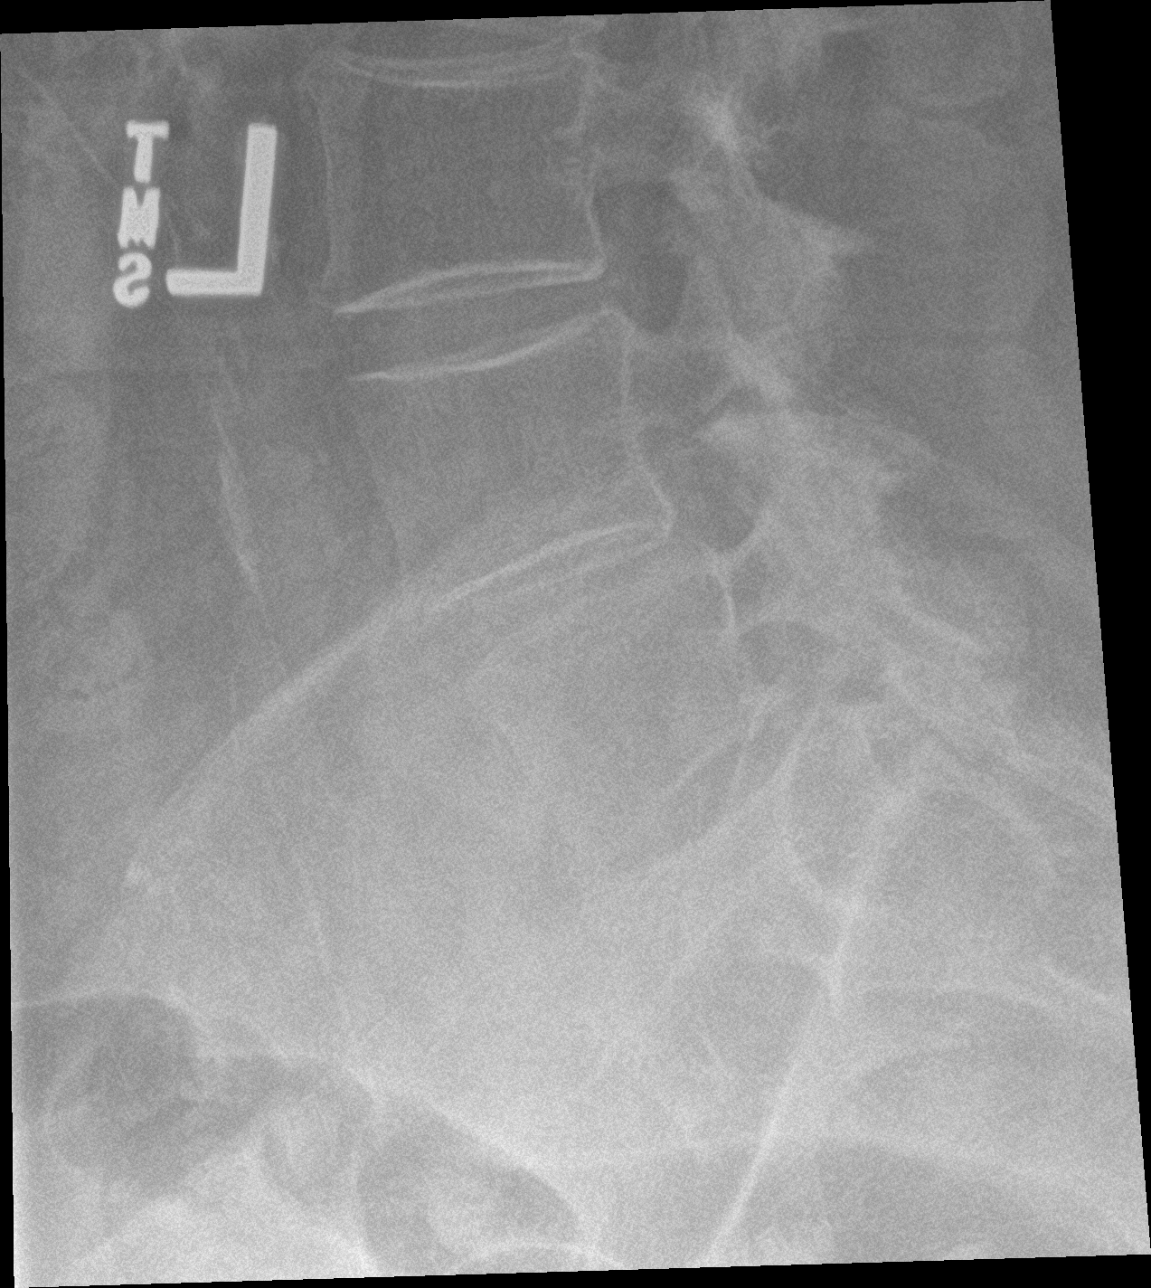

[5 of 5 positions shown; findings below may reference images not displayed]

FINDINGS: No definite vertebral body height loss when accounting for
obliquity. Minimal anterolisthesis (grade 1) of L4 on L5 and L5 on
S1, similar to prior. No new subluxation. Mild dextrocurvature.
Mild-to-moderate degenerative changes, more pronounced in the lower
lumbar spine and better characterized on prior MRI of the lumbar
spine. Calcific aortic atherosclerosis.
IMPRESSION: No radiographic evidence of acute fracture or traumatic
malalignment.

## 2020-04-16 MED ORDER — HEPARIN SODIUM (PORCINE) 5000 UNIT/ML IJ SOLN
5000.0000 [IU] | Freq: Three times a day (TID) | INTRAMUSCULAR | Status: DC
  Administered 2020-04-16 – 2020-04-23 (×21): 5000 [IU] via SUBCUTANEOUS
  Filled 2020-04-16 (×21): qty 1

## 2020-04-16 MED ORDER — ACETAMINOPHEN 500 MG PO TABS
1000.0000 mg | ORAL_TABLET | Freq: Once | ORAL | Status: AC
  Administered 2020-04-16: 1000 mg via ORAL
  Filled 2020-04-16: qty 2

## 2020-04-16 MED ORDER — ZOLPIDEM TARTRATE 5 MG PO TABS
5.0000 mg | ORAL_TABLET | Freq: Every evening | ORAL | Status: DC | PRN
  Administered 2020-04-17: 5 mg via ORAL
  Filled 2020-04-16: qty 1

## 2020-04-16 MED ORDER — ACETAMINOPHEN 650 MG RE SUPP: 650.0000 mg | Freq: Four times a day (QID) | RECTAL | Status: DC | PRN

## 2020-04-16 MED ORDER — SODIUM CHLORIDE 0.9 % IV BOLUS (SEPSIS)
1000.0000 mL | Freq: Once | INTRAVENOUS | Status: AC
  Administered 2020-04-16: 1000 mL via INTRAVENOUS

## 2020-04-16 MED ORDER — DICLOFENAC SODIUM 1 % EX GEL
4.0000 g | Freq: Four times a day (QID) | CUTANEOUS | Status: DC
  Administered 2020-04-16 – 2020-04-23 (×26): 4 g via TOPICAL
  Filled 2020-04-16 (×2): qty 100

## 2020-04-16 MED ORDER — ACETAMINOPHEN 325 MG PO TABS
650.0000 mg | ORAL_TABLET | Freq: Four times a day (QID) | ORAL | Status: DC | PRN
  Administered 2020-04-16 – 2020-04-23 (×8): 650 mg via ORAL
  Filled 2020-04-16 (×8): qty 2

## 2020-04-16 MED ORDER — SIMVASTATIN 20 MG PO TABS
20.0000 mg | ORAL_TABLET | Freq: Every day | ORAL | Status: DC
  Administered 2020-04-16 – 2020-04-17 (×2): 20 mg via ORAL
  Filled 2020-04-16 (×3): qty 1

## 2020-04-16 MED ORDER — SODIUM CHLORIDE 0.9 % IV SOLN
1000.0000 mL | INTRAVENOUS | Status: DC
  Administered 2020-04-16: 1000 mL via INTRAVENOUS

## 2020-04-16 NOTE — ED Notes (Signed)
Dinner ordered 

## 2020-04-16 NOTE — ED Notes (Signed)
Pt encouraged to provide urine sample, purwick in place

## 2020-04-16 NOTE — ED Notes (Signed)
Patient transported to X-ray 

## 2020-04-16 NOTE — Hospital Course (Addendum)
Admitted 04/16/2020  Allergies: Morphine and related and Sulfa antibiotics Pertinent Hx: chronic atrial fibrillation on coumadin, lung cancer s/p R lower lobe lobe lobectomy with recent recurrence, tobacco abuse, hypertension, hyperlipidemia, obesity, HFpEF, RA, and recent Covid infection  79 y.o. female p/w generalized weakness  * Generalized weakness: Stuck between toilet and shower x6 hours, unable to get up due to slipping. Unclear etiology however multiple conditions could be contributing, including recent covid infection, lung cancer recurrence, heart failure. Noted to have AKI and mildly elevated CK. Given small bolus of fluid.   *HFpEF: Holding home medications  *AKI: Cr up to 1.36. Giving small bolus and repeating in AM>   *Lung cancer: Recurrence noted in late June, needing biopsy however postponed due to +COVID. Will need biopsy in the future.   Consults: None  Meds: Ambien,  zocor VTE ppx: heparin IVF: NS Diet: HH    Subjective 9/2  Sore throat s/p procedure but antichloraseptic helps. Patient reports decreased cough.    - Patient would like husband be made aware of her going to SNF> SW

## 2020-04-16 NOTE — Progress Notes (Signed)
I updated scheduling to schedule patient after her bx.

## 2020-04-16 NOTE — ED Notes (Signed)
Pt sitting up to eat dinner

## 2020-04-16 NOTE — H&P (Addendum)
Date: 04/16/2020               Patient Name:  Leah Velasquez MRN: [id number]  DOB: Jul 10, 1941 Age / Sex: 79 y.o., female   PCP: [name], MD         Medical Service: Internal Medicine Teaching Service         Attending Physician: Dr. Sid Falcon, MD    First Contact: Dr. Shon Baton Pager: [phone]  Second Contact: Dr. Koleen Distance Pager: [phone]       After Hours (After 5p/  First Contact Pager: [phone]  weekends / holidays): Second Contact Pager: [phone]   Chief Complaint: Generalized weakness, down for 6 hours  History of Present Illness: This is a 79 year old female with history of chronic atrial fibrillation on coumadin, lung cancer status post right lower lobe lobe lobectomy, tobacco abuse, hypertension, hyperlipidemia, obesity, heart failure with preserved ejection fraction, rheumatoid arthritis, and recent Covid infection(ended quarantine on 04/10/2020) presenting after slipping off her toilet seat and getting stuck between her toilet and the shower for approximately 6 hours.  Admitted from 6/25-7/1, noted to have progressive lower extremity weakness and sensory neuropathy at that time as well as the right apical and right subhilar lung mass.  Thought that the lower extremity weakness could be a vitamin B12 deficiency and she is now on B12 supplements.  She reports last night she got up at night to go to the bathroom, she uses a walker to get around, she is currently on Lasix and reports having issues with incontinence, she states when she sat on the toilet her bottom was wet and she slid down next to the toilet and got stuck between the toilet and the shower.  She tried multiple times to get up however she kept slipping in the walker kept rolling away.  She was stuck there for about 5 to 6 hours.  She described calling out to her husband however he sleeps in a different room and wears a CPAP at night so he did not hear her until morning.  She denies passing out, chest pain,  shortness of breath, lightheadedness, palpitations, or fevers.  She reports that she has been eating okay.  She notes a recent Covid infection where she finished quarantine on the 20th, she currently is undergoing recurrence of her lung cancer and was scheduled to get a biopsy, this is why she had the Covid test.  She reports since then she has been having more fatigue, occasional shortness of breath, and weakness.  She currently sleeps on a recliner, states that the hospital but they were try to get her was uncomfortable, however is able to lay flat with no issues.  She endorses some orthopnea however states that this is not new.  Does have some leg swelling.  In the ED she was noted to be mildly hypothermic to 96, vitals are otherwise unremarkable.  Labs significant for mild hypokalemia 3.3, creatinine 1.36 (BL around 0.8), minimal leukocytosis at 10.7, hemoglobin 15, platelets 210.  BNP mildly elevated 178.  CK 1700.  Troponins 37 and 31.  Chest x-ray showed cardiomegaly, mild interstitial pulmonary edema, prominence of right paratracheal stripe.  Lumbar x-ray showed no fractures noted. Patient was admitted to IMTS.   Meds:  Current Meds  Medication Sig   [facility] VITAMIN B12 1000 MCG tablet Take 1,000 mcg by mouth daily.   furosemide (LASIX) 40 MG tablet Take 20 mg by mouth daily.   levocetirizine (XYZAL) 5 MG  tablet Take 5 mg by mouth daily.   losartan (COZAAR) 50 MG tablet Take 25 mg by mouth daily.   metoprolol succinate (TOPROL-XL) 25 MG 24 hr tablet Take 12.5 mg by mouth daily.   simvastatin (ZOCOR) 20 MG tablet Take 20 mg by mouth at bedtime.   timolol (TIMOPTIC) 0.5 % ophthalmic solution Place 1 drop into both eyes at bedtime.   traMADol (ULTRAM) 50 MG tablet Take 50 mg by mouth daily as needed.   warfarin (COUMADIN) 5 MG tablet Take 5 mg by mouth daily.   zolpidem (AMBIEN) 5 MG tablet Take 5 mg by mouth at bedtime as needed for sleep.     Allergies: Allergies as of  04/16/2020   (No Known Allergies)   History reviewed. No pertinent past medical history.  Family History:  Mother had hypertension and colon cancer.  No known history of lung cancer in the family.  Social History: Currently living with her husband at home, has a housekeeper that comes out occasionally.  Quit smoking in 2000's.  Denies any alcohol or drug use.  Moved from [place] in 2017 to be closer to husband's daughter.  Review of Systems: A complete ROS was negative except as per HPI.   Physical Exam: Blood pressure 138/72, pulse 90, temperature (!) 96.2 F (35.7 C), temperature source Rectal, resp. rate (!) 21, height 5\' 1"  (1.549 m), weight 106.6 kg, SpO2 93 %. Physical Exam Constitutional:      Appearance: Normal appearance.     Comments: Elderly female, weak appearing  HENT:     Mouth/Throat:     Mouth: Mucous membranes are moist.     Pharynx: Oropharynx is clear.  Cardiovascular:     Rate and Rhythm: Normal rate and regular rhythm.     Pulses: Normal pulses.     Heart sounds: Normal heart sounds.  Pulmonary:     Effort: Pulmonary effort is normal.     Breath sounds: Normal breath sounds.  Musculoskeletal:        General: Swelling (1+ BL LE swelling up to mid shin) present.     Cervical back: Normal range of motion and neck supple.  Skin:    General: Skin is warm and dry.     Capillary Refill: Capillary refill takes less than 2 seconds.  Neurological:     Mental Status: She is alert and oriented to person, place, and time.     Comments: Bilateral lower extremity weakness 2/5 limited by effort. 4/5 bilateral upper extremity strength.  Psychiatric:        Mood and Affect: Mood normal.        Behavior: Behavior normal.      EKG: personally reviewed my interpretation is atrial fibrillation, rate controlled, TWI in lead 2 seen on prior  CXR: personally reviewed my interpretation is cardiomegaly, mild interstitial pulmonary edema, right upper lobe  prominence  Assessment & Plan by Problem: Active Problems:   Generalized weakness  This is a 79 year old female with history of chronic atrial fibrillation on Coumadin lung cancer status post right lower lobe lobe lobectomy, tobacco abuse, hypertension, hyperlipidemia, obesity, heart failure with preserved ejection fraction, rheumatoid arthritis, and recent Covid infection(ended quarantine on 04/10/2020) presenting after slipping off her toilet seat and getting stuck between her toilet and the shower for approximately 6 hours.  Generalized weakness: Unclear etiology at this time however symptoms do not appear acute.  Patient was having similar symptoms during her admission in late June/early July, felt to be  secondary to B12 deficiency and patient is currently on a B12 supplement.  Patient has multiple conditions that could be contributing to generalized weakness including recent Covid infection, recurrence of lung cancer, and history of heart failure. In regards to her heart failure she is currently on Lasix, metoprolol, and losartan.  Her BNP is mildly elevated at 178, and chest x-ray does show some mild pulmonary edema.  Appears mildly fluid on exam, with 1+ bilateral lower extremity swelling, lungs are CTA BL.  Does not appear to be in acute heart failure exacerbation at this time.  Even her AKI, and mild elevation in her CK she does appear to be dehydrated.  We will give a small bolus of normal saline and keep a close eye on volume and oxygen status.   -500 cc bolus -BMP and CBC in a.m. -Check B12 level -PT/OT -Monitor CK  AKI: Creatinine elevated at 1.36, up from baseline of around 0.8.  Could be related to dehydration versus the elevated CK.  Giving a small bolus and repeating BMP in a.m.  -Trend BMP  Lung carcinoma: Status post right lower lobectomy for early stage lung cancer about 20 years ago.  Recently noted to have hypermetabolic lesions on the right upper lobe on PET/CT.  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  Following with cardiothoracic surgery for biopsy which was postponed due to Covid positive result.  She did no get the monoclonal antibody treatment, she qualified due to her comorbid conditions.  Currently is oxygenating well on room air.  -Continue to monitor oxygen status  Atrial fibrillation: -On Coumadin.  Currently rate controlled.  On metoprolol 12.5 mg daily.  Holding this in setting of normotensive and rate control, however can resume as needed.  Hypertension: Currently on Lasix 20 mg daily, losartan 25 mg daily and metoprolol 12.5 mg daily.  Blood pressure today normotensive.  We will hold her blood pressure medications for now.  Heart failure with preserved EF: Currently on Lasix 20 mg daily, losartan 25 mg daily and metoprolol 12.5 mg daily.  She appears mildly volume overloaded on exam today.  However given her AKI and the elevated CK levels were giving a small bolus of normal saline.  We will keep a close eye on her volume status and oxygen status, may need to diurese.  -Hold home medications at this time, can resume as needed  Dispo: Admit patient to Observation with expected length of stay less than 2 midnights.  Signed: Asencion Noble, MD 04/16/2020, 12:44 PM  Pager: [phone] After 5pm on weekdays and 1pm on weekends: On Call pager: [phone]

## 2020-04-16 NOTE — ED Notes (Signed)
Attempted to ambulate with walker with 2 staff assist, pt able to stand on side of the bed but her knees gave out and almost fell onto floor, pt assisted back into bed

## 2020-04-16 NOTE — Progress Notes (Signed)
I followed up with transportation services due to Ms. Leah Velasquez's appt re-scheduled.  They are updated with the correct dates and times.

## 2020-04-16 NOTE — Progress Notes (Signed)
I updated transportation services of Leah Velasquez's new appts

## 2020-04-16 NOTE — ED Triage Notes (Signed)
Per EMS, pt from home, fell about 11pm last night, was trapped for 6 hours.  EMS found her communicating, however she has become solumlant but did report she could not feel her legs.  87/50, placed on epi drip increased to 124/60.

## 2020-04-16 NOTE — Telephone Encounter (Signed)
Called pt per 8/26 sch msg - unable to reach pt - no vmail for home and vmail full for cell.

## 2020-04-16 NOTE — Progress Notes (Signed)
Orthopedic Tech Progress Note Patient Details:  DOMINIK LAURICELLA Dec 19, 1940 [id number] Level 1 Trauma  Patient ID: Ronna Polio, female   DOB: 21-Jun-1941, 79 y.o.   MRN: [id number]   [name] 04/16/2020, 6:44 AM

## 2020-04-16 NOTE — ED Provider Notes (Signed)
[facility] EMERGENCY DEPARTMENT Provider Note   CSN: [id number] Arrival date & time: 04/16/20  2440     History Chief Complaint  Patient presents with  . Fall    Leah Velasquez is a 79 y.o. female.  79 yo F with a chief complaints of weakness.  The patient went to the bathroom in the middle the night and was unable to get up off the toilet.  She thinks she was on there for about 8 hours.  Feeling tingling in her legs.  Hypotensive on the scene improved with epinephrine.  Had a syncopal event upon standing with EMS as well.  Complaining of some low back pain.  She denies actual trauma.  Feels sore in her bottom.  Denies any chest pain shortness of breath headache or neck pain.  The history is provided by the patient.  Fall Pertinent negatives include no chest pain, no headaches and no shortness of breath.  Illness Severity:  Severe Onset quality:  Sudden Duration:  8 hours Timing:  Constant Progression:  Unchanged Chronicity:  New Associated symptoms: fatigue   Associated symptoms: no chest pain, no congestion, no fever, no headaches, no myalgias, no nausea, no rhinorrhea, no shortness of breath, no vomiting and no wheezing        History reviewed. No pertinent past medical history.  Patient Active Problem List   Diagnosis Date Noted  . Generalized weakness 04/16/2020    History reviewed. No pertinent surgical history.   OB History   No obstetric history on file.     No family history on file.  Social History   Tobacco Use  . Smoking status: Not on file  Substance Use Topics  . Alcohol use: Not on file  . Drug use: Not on file    Home Medications Prior to Admission medications   Medication Sig Start Date End Date Taking? Authorizing Provider  [facility] VITAMIN B12 1000 MCG tablet Take 1,000 mcg by mouth daily. 02/20/20  Yes [provider]  furosemide (LASIX) 40 MG tablet Take 20 mg by mouth daily. 03/18/20  Yes [provider]   levocetirizine (XYZAL) 5 MG tablet Take 5 mg by mouth daily. 01/28/20  Yes [provider]  losartan (COZAAR) 50 MG tablet Take 25 mg by mouth daily. 03/17/20  Yes [provider]  metoprolol succinate (TOPROL-XL) 25 MG 24 hr tablet Take 12.5 mg by mouth at bedtime.  12/18/19  Yes [provider]  simvastatin (ZOCOR) 20 MG tablet Take 20 mg by mouth at bedtime. 01/28/20  Yes [provider]  timolol (TIMOPTIC) 0.5 % ophthalmic solution Place 1 drop into both eyes at bedtime. 11/03/19  Yes [provider]  traMADol (ULTRAM) 50 MG tablet Take 50 mg by mouth daily as needed for moderate pain.  03/17/20  Yes [provider]  warfarin (COUMADIN) 5 MG tablet Take 2.5-5 mg by mouth See admin instructions. INR visit 8/18: on 8/26 take 1/2 tablet (2.5mg ), on 8/27 1 tab(5mg ), on 8/28 take 1/2 tab (2.5mg ) , on 8/29 take 1/2 tab (2.5mg ) then 8/30 follow up INR visit 10/29/19  Yes [provider]  zolpidem (AMBIEN) 5 MG tablet Take 5 mg by mouth at bedtime as needed for sleep. 04/03/20  Yes [provider]    Allergies    Morphine and related and Sulfa antibiotics  Review of Systems   Review of Systems  Constitutional: Positive for fatigue. Negative for chills and fever.  HENT: Negative for congestion  and rhinorrhea.   Eyes: Negative for redness and visual disturbance.  Respiratory: Negative for shortness of breath and wheezing.   Cardiovascular: Negative for chest pain and palpitations.  Gastrointestinal: Negative for nausea and vomiting.  Genitourinary: Negative for dysuria and urgency.  Musculoskeletal: Positive for back pain. Negative for arthralgias and myalgias.  Skin: Negative for pallor and wound.  Neurological: Negative for dizziness and headaches.    Physical Exam Updated Vital Signs BP 139/69 (BP Location: Left Arm)   Pulse 79   Temp 97.8 F (36.6 C) (Oral)   Resp 19   Ht 5\' 1"  (1.549 m)   Wt 97.1 kg   SpO2 94%   BMI  40.43 kg/m   Physical Exam Vitals and nursing note reviewed.  Constitutional:      General: She is not in acute distress.    Appearance: She is well-developed. She is obese. She is not diaphoretic.  HENT:     Head: Normocephalic and atraumatic.  Eyes:     Pupils: Pupils are equal, round, and reactive to light.  Cardiovascular:     Rate and Rhythm: Normal rate. Rhythm irregular.     Heart sounds: No murmur heard.  No friction rub. No gallop.   Pulmonary:     Effort: Pulmonary effort is normal.     Breath sounds: No wheezing or rales.  Abdominal:     General: There is no distension.     Palpations: Abdomen is soft.     Tenderness: There is no abdominal tenderness. There is no guarding.  Genitourinary:   Musculoskeletal:        General: No tenderness.       Arms:     Cervical back: Normal range of motion and neck supple.  Skin:    General: Skin is warm and dry.  Neurological:     Mental Status: She is alert and oriented to person, place, and time.  Psychiatric:        Behavior: Behavior normal.     ED Results / Procedures / Treatments   Labs (all labs ordered are listed, but only abnormal results are displayed) Labs Reviewed  CBC - Abnormal; Notable for the following components:      Result Value   WBC 10.7 (*)    Hemoglobin 15.4 (*)    HCT 48.0 (*)    All other components within normal limits  COMPREHENSIVE METABOLIC PANEL - Abnormal; Notable for the following components:   Potassium 3.3 (*)    CO2 20 (*)    Glucose, Bld 104 (*)    Creatinine, Ser 1.36 (*)    Albumin 3.4 (*)    AST 49 (*)    GFR calc non Af Amer 37 (*)    GFR calc Af Amer 43 (*)    Anion gap 18 (*)    All other components within normal limits  PROTIME-INR - Abnormal; Notable for the following components:   Prothrombin Time 17.2 (*)    INR 1.5 (*)    All other components within normal limits  URINALYSIS, ROUTINE W REFLEX MICROSCOPIC - Abnormal; Notable for the following components:    Color, Urine AMBER (*)    APPearance CLOUDY (*)    Hgb urine dipstick MODERATE (*)    Protein, ur 30 (*)    Leukocytes,Ua LARGE (*)    Bacteria, UA MANY (*)    All other components within normal limits  BRAIN NATRIURETIC PEPTIDE - Abnormal; Notable for the following components:   B Natriuretic  Peptide 178.5 (*)    All other components within normal limits  CK - Abnormal; Notable for the following components:   Total CK 1,732 (*)    All other components within normal limits  BASIC METABOLIC PANEL - Abnormal; Notable for the following components:   Potassium 2.9 (*)    Calcium 8.7 (*)    GFR calc non Af Amer 54 (*)    All other components within normal limits  TROPONIN I (HIGH SENSITIVITY) - Abnormal; Notable for the following components:   Troponin I (High Sensitivity) 37 (*)    All other components within normal limits  TROPONIN I (HIGH SENSITIVITY) - Abnormal; Notable for the following components:   Troponin I (High Sensitivity) 31 (*)    All other components within normal limits  SARS CORONAVIRUS 2 BY RT PCR (HOSPITAL ORDER, [facility])  VITAMIN B12  CBC  MAGNESIUM  BASIC METABOLIC PANEL  CBG MONITORING, ED    EKG EKG Interpretation  Date/Time:  Thursday April 16 2020 06:54:35 EDT Ventricular Rate:  80 PR Interval:    QRS Duration: 94 QT Interval:  402 QTC Calculation: 464 R Axis:   37 Text Interpretation: Atrial fibrillation RSR' in V1 or V2, right VCD or RVH No old tracing to compare Confirmed by Deno Etienne [phone]) on 04/16/2020 6:57:47 AM   Radiology DG Chest 2 View  Result Date: 04/16/2020 CLINICAL DATA:  [place] last night.  Low back pain. EXAM: CHEST - 2 VIEW COMPARISON:  Multiple priors, including chest radiograph from 02/20/2020 and PET-CT from 03/10/2020 FINDINGS: Prominence of the right paratracheal stripe with outwardly convex contour, likely secondary to known paramediastinal and right upper lobe lesions, better characterized on  prior PET-CT. New diffuse interstitial prominence. Low lung volumes without focal consolidation. No discernible pneumothorax. No definite pleural effusions. Mild-to-moderate enlargement of the cardiopericardial silhouette, increased from prior. Polyarticular degenerative change. Calcific atherosclerosis of the aorta. Multiple remote right rib fractures without evidence of acute osseous abnormality. IMPRESSION: 1. Cardiomegaly with new diffuse interstitial prominence, suggestive of mild interstitial pulmonary edema. 2. Prominence of the right paratracheal stripe, likely secondary to known paramediastinal and right upper lobe lesions, better characterized on prior PET-CT. Electronically Signed   By: Margaretha Sheffield MD   On: 04/16/2020 07:59   DG Lumbar Spine Complete  Result Date: 04/16/2020 CLINICAL DATA:  Left low back pain.  Fall. EXAM: LUMBAR SPINE - COMPLETE 4+ VIEW COMPARISON:  MRI lumbar spine 02/14/2020 FINDINGS: No definite vertebral body height loss when accounting for obliquity. Minimal anterolisthesis (grade 1) of L4 on L5 and L5 on S1, similar to prior. No new subluxation. Mild dextrocurvature. Mild-to-moderate degenerative changes, more pronounced in the lower lumbar spine and better characterized on prior MRI of the lumbar spine. Calcific aortic atherosclerosis. IMPRESSION: No radiographic evidence of acute fracture or traumatic malalignment. Electronically Signed   By: Margaretha Sheffield MD   On: 04/16/2020 08:04    Procedures Procedures (including critical care time)  Medications Ordered in ED Medications  diclofenac Sodium (VOLTAREN) 1 % topical gel 4 g (4 g Topical Not Given 04/16/20 2117)  heparin injection 5,000 Units (5,000 Units Subcutaneous Given 04/17/20 0642)  acetaminophen (TYLENOL) tablet 650 mg (650 mg Oral Given 04/17/20 0646)    Or  acetaminophen (TYLENOL) suppository 650 mg ( Rectal See Alternative 04/17/20 0646)  zolpidem (AMBIEN) tablet 5 mg (has no administration in  time range)  simvastatin (ZOCOR) tablet 20 mg (20 mg Oral Given 04/16/20 2116)  potassium  chloride (KLOR-CON) packet 40 mEq (has no administration in time range)  sodium chloride 0.9 % bolus 1,000 mL (0 mLs Intravenous Stopped 04/16/20 0924)  acetaminophen (TYLENOL) tablet 1,000 mg (1,000 mg Oral Given 04/16/20 1117)    ED Course  I have reviewed the triage vital signs and the nursing notes.  Pertinent labs & imaging results that were available during my care of the patient were reviewed by me and considered in my medical decision making (see chart for details).    MDM Rules/Calculators/A&P                          79 yo F with a chief complaints of being unable to get off the toilet.  This lasted for about 8 hours.  Had a syncopal event upon standing with EMS.  Blood pressures improved here.  Hypothermic likely secondary to exposure.  Clinically dehydrated.  We will give a bolus of IV fluids.  Lab work.  Attempt to ambulate.  Complaining of some left-sided low back pain which likely is muscular due to positioning.  The patient also had some transient weakness to her legs which is likely due to compression from her seated position.  Patient with elevated ck.  Very fatigued on reassessment.  Barely able to stand.  Will discuss with medicine. 3  The patients results and plan were reviewed and discussed.   Any x-rays performed were independently reviewed by myself.   Differential diagnosis were considered with the presenting HPI.  Medications  diclofenac Sodium (VOLTAREN) 1 % topical gel 4 g (4 g Topical Not Given 04/16/20 2117)  heparin injection 5,000 Units (5,000 Units Subcutaneous Given 04/17/20 0642)  acetaminophen (TYLENOL) tablet 650 mg (650 mg Oral Given 04/17/20 0646)    Or  acetaminophen (TYLENOL) suppository 650 mg ( Rectal See Alternative 04/17/20 0646)  zolpidem (AMBIEN) tablet 5 mg (has no administration in time range)  simvastatin (ZOCOR) tablet 20 mg (20 mg Oral Given 04/16/20  2116)  potassium chloride (KLOR-CON) packet 40 mEq (has no administration in time range)  sodium chloride 0.9 % bolus 1,000 mL (0 mLs Intravenous Stopped 04/16/20 0924)  acetaminophen (TYLENOL) tablet 1,000 mg (1,000 mg Oral Given 04/16/20 1117)    Vitals:   04/16/20 2200 04/16/20 2358 04/17/20 0000 04/17/20 0316  BP: 138/69 134/70  139/69  Pulse: 80 85  79  Resp: (!) 24 (!) 22  19  Temp:  98 F (36.7 C)  97.8 F (36.6 C)  TempSrc:    Oral  SpO2: 93% 96%  94%  Weight:   97.1 kg   Height:   5\' 1"  (1.549 m)     Final diagnoses:  Chest pain  Non-traumatic rhabdomyolysis    Admission/ observation were discussed with the admitting physician, patient and/or family and they are comfortable with the plan.    Final Clinical Impression(s) / ED Diagnoses Final diagnoses:  Chest pain  Non-traumatic rhabdomyolysis    Rx / DC Orders ED Discharge Orders    None       Deno Etienne, DO 04/17/20 9147

## 2020-04-16 NOTE — ED Notes (Signed)
Lunch tray delivered, pt sleeping, woke pt up to eat but pt states she would like to rest at this time and eat later

## 2020-04-16 NOTE — ED Notes (Signed)
Pt transferred to hospital bed for comfort.

## 2020-04-17 DIAGNOSIS — E669 Obesity, unspecified: Secondary | ICD-10-CM

## 2020-04-17 DIAGNOSIS — K529 Noninfective gastroenteritis and colitis, unspecified: Secondary | ICD-10-CM | POA: Diagnosis not present

## 2020-04-17 DIAGNOSIS — I110 Hypertensive heart disease with heart failure: Secondary | ICD-10-CM | POA: Diagnosis not present

## 2020-04-17 DIAGNOSIS — R680 Hypothermia, not associated with low environmental temperature: Secondary | ICD-10-CM | POA: Diagnosis not present

## 2020-04-17 DIAGNOSIS — Z7901 Long term (current) use of anticoagulants: Secondary | ICD-10-CM

## 2020-04-17 DIAGNOSIS — I2510 Atherosclerotic heart disease of native coronary artery without angina pectoris: Secondary | ICD-10-CM | POA: Diagnosis not present

## 2020-04-17 DIAGNOSIS — I482 Chronic atrial fibrillation, unspecified: Secondary | ICD-10-CM

## 2020-04-17 DIAGNOSIS — M6282 Rhabdomyolysis: Secondary | ICD-10-CM

## 2020-04-17 DIAGNOSIS — Z8616 Personal history of COVID-19: Secondary | ICD-10-CM

## 2020-04-17 DIAGNOSIS — Z902 Acquired absence of lung [part of]: Secondary | ICD-10-CM

## 2020-04-17 DIAGNOSIS — C3491 Malignant neoplasm of unspecified part of right bronchus or lung: Secondary | ICD-10-CM

## 2020-04-17 DIAGNOSIS — E785 Hyperlipidemia, unspecified: Secondary | ICD-10-CM

## 2020-04-17 DIAGNOSIS — I5032 Chronic diastolic (congestive) heart failure: Secondary | ICD-10-CM | POA: Diagnosis not present

## 2020-04-17 DIAGNOSIS — E538 Deficiency of other specified B group vitamins: Secondary | ICD-10-CM | POA: Diagnosis not present

## 2020-04-17 DIAGNOSIS — E878 Other disorders of electrolyte and fluid balance, not elsewhere classified: Secondary | ICD-10-CM

## 2020-04-17 DIAGNOSIS — E876 Hypokalemia: Secondary | ICD-10-CM | POA: Diagnosis not present

## 2020-04-17 DIAGNOSIS — G3184 Mild cognitive impairment, so stated: Secondary | ICD-10-CM | POA: Diagnosis not present

## 2020-04-17 DIAGNOSIS — I5030 Unspecified diastolic (congestive) heart failure: Secondary | ICD-10-CM

## 2020-04-17 DIAGNOSIS — N179 Acute kidney failure, unspecified: Secondary | ICD-10-CM | POA: Diagnosis not present

## 2020-04-17 DIAGNOSIS — Z85118 Personal history of other malignant neoplasm of bronchus and lung: Secondary | ICD-10-CM | POA: Diagnosis not present

## 2020-04-17 DIAGNOSIS — Z6841 Body Mass Index (BMI) 40.0 and over, adult: Secondary | ICD-10-CM | POA: Diagnosis not present

## 2020-04-17 DIAGNOSIS — M069 Rheumatoid arthritis, unspecified: Secondary | ICD-10-CM | POA: Diagnosis not present

## 2020-04-17 DIAGNOSIS — R3915 Urgency of urination: Secondary | ICD-10-CM | POA: Diagnosis not present

## 2020-04-17 DIAGNOSIS — R590 Localized enlarged lymph nodes: Secondary | ICD-10-CM | POA: Diagnosis not present

## 2020-04-17 DIAGNOSIS — E860 Dehydration: Secondary | ICD-10-CM | POA: Diagnosis not present

## 2020-04-17 DIAGNOSIS — Z20822 Contact with and (suspected) exposure to covid-19: Secondary | ICD-10-CM | POA: Diagnosis not present

## 2020-04-17 DIAGNOSIS — C3411 Malignant neoplasm of upper lobe, right bronchus or lung: Secondary | ICD-10-CM | POA: Diagnosis not present

## 2020-04-17 DIAGNOSIS — N952 Postmenopausal atrophic vaginitis: Secondary | ICD-10-CM | POA: Diagnosis not present

## 2020-04-17 DIAGNOSIS — R42 Dizziness and giddiness: Secondary | ICD-10-CM | POA: Diagnosis not present

## 2020-04-17 DIAGNOSIS — I4819 Other persistent atrial fibrillation: Secondary | ICD-10-CM | POA: Diagnosis not present

## 2020-04-17 DIAGNOSIS — R531 Weakness: Secondary | ICD-10-CM | POA: Diagnosis present

## 2020-04-17 LAB — MAGNESIUM: Magnesium: 1.9 mg/dL (ref 1.7–2.4)

## 2020-04-17 LAB — BASIC METABOLIC PANEL
Anion gap: 10 (ref 5–15)
Anion gap: 10 (ref 5–15)
Anion gap: 7 (ref 5–15)
BUN: 11 mg/dL (ref 8–23)
BUN: 12 mg/dL (ref 8–23)
BUN: 11 mg/dL (ref 8–23)
CO2: 25 mmol/L (ref 22–32)
CO2: 24 mmol/L (ref 22–32)
CO2: 26 mmol/L (ref 22–32)
Calcium: 8.7 mg/dL — ABNORMAL LOW (ref 8.9–10.3)
Calcium: 8.3 mg/dL — ABNORMAL LOW (ref 8.9–10.3)
Calcium: 8.5 mg/dL — ABNORMAL LOW (ref 8.9–10.3)
Chloride: 104 mmol/L (ref 98–111)
Chloride: 103 mmol/L (ref 98–111)
Chloride: 104 mmol/L (ref 98–111)
Creatinine, Ser: 1 mg/dL (ref 0.44–1.00)
Creatinine, Ser: 0.99 mg/dL (ref 0.44–1.00)
Creatinine, Ser: 0.9 mg/dL (ref 0.44–1.00)
GFR calc Af Amer: >60 mL/min (ref >60)
GFR calc Af Amer: >60 mL/min (ref >60)
GFR calc Af Amer: >60 mL/min (ref >60)
GFR calc non Af Amer: 54 mL/min — ABNORMAL LOW (ref >60)
GFR calc non Af Amer: 55 mL/min — ABNORMAL LOW (ref >60)
GFR calc non Af Amer: >60 mL/min (ref >60)
Glucose, Bld: 79 mg/dL (ref 70–99)
Glucose, Bld: 110 mg/dL — ABNORMAL HIGH (ref 70–99)
Glucose, Bld: 101 mg/dL — ABNORMAL HIGH (ref 70–99)
Potassium: 2.9 mmol/L — ABNORMAL LOW (ref 3.5–5.1)
Potassium: 2.9 mmol/L — ABNORMAL LOW (ref 3.5–5.1)
Potassium: 3.9 mmol/L (ref 3.5–5.1)
Sodium: 139 mmol/L (ref 135–145)
Sodium: 137 mmol/L (ref 135–145)
Sodium: 137 mmol/L (ref 135–145)

## 2020-04-17 LAB — CBC
HCT: 37.7 % (ref 36.0–46.0)
Hemoglobin: 12.1 g/dL (ref 12.0–15.0)
MCH: 30.3 pg (ref 26.0–34.0)
MCHC: 32.1 g/dL (ref 30.0–36.0)
MCV: 94.3 fL (ref 80.0–100.0)
Platelets: 164 10*3/uL (ref 150–400)
RBC: 4 MIL/uL (ref 3.87–5.11)
RDW: 14.6 % (ref 11.5–15.5)
WBC: 6.5 10*3/uL (ref 4.0–10.5)
nRBC: 0 % (ref 0.0–0.2)

## 2020-04-17 MED ORDER — METOPROLOL SUCCINATE ER 25 MG PO TB24
12.5000 mg | ORAL_TABLET | Freq: Every day | ORAL | Status: DC
  Administered 2020-04-17 – 2020-04-23 (×7): 12.5 mg via ORAL
  Filled 2020-04-17 (×7): qty 1

## 2020-04-17 MED ORDER — TIMOLOL MALEATE 0.5 % OP SOLN
1.0000 [drp] | Freq: Every day | OPHTHALMIC | Status: DC
  Administered 2020-04-17 – 2020-04-23 (×7): 1 [drp] via OPHTHALMIC
  Filled 2020-04-17: qty 5

## 2020-04-17 MED ORDER — LOSARTAN POTASSIUM 25 MG PO TABS
25.0000 mg | ORAL_TABLET | Freq: Every day | ORAL | Status: DC
  Administered 2020-04-17 – 2020-04-23 (×6): 25 mg via ORAL
  Filled 2020-04-17 (×6): qty 1

## 2020-04-17 MED ORDER — POTASSIUM CHLORIDE 20 MEQ PO PACK
40.0000 meq | PACK | Freq: Two times a day (BID) | ORAL | Status: AC
  Administered 2020-04-17 (×2): 40 meq via ORAL
  Filled 2020-04-17 (×2): qty 2

## 2020-04-17 MED ORDER — WARFARIN - PHARMACIST DOSING INPATIENT: Freq: Every day | Status: DC

## 2020-04-17 MED ORDER — WARFARIN SODIUM 5 MG PO TABS
5.0000 mg | ORAL_TABLET | Freq: Once | ORAL | Status: AC
  Administered 2020-04-17: 5 mg via ORAL
  Filled 2020-04-17: qty 1

## 2020-04-17 MED ORDER — POTASSIUM CHLORIDE 10 MEQ/100ML IV SOLN
10.0000 meq | INTRAVENOUS | Status: AC
  Filled 2020-04-17: qty 100

## 2020-04-17 MED ORDER — FUROSEMIDE 20 MG PO TABS
20.0000 mg | ORAL_TABLET | Freq: Every day | ORAL | Status: DC
  Administered 2020-04-18 – 2020-04-23 (×5): 20 mg via ORAL
  Filled 2020-04-17 (×6): qty 1

## 2020-04-17 MED ORDER — POTASSIUM CHLORIDE 10 MEQ/100ML IV SOLN
10.0000 meq | INTRAVENOUS | Status: DC
  Administered 2020-04-17: 10 meq via INTRAVENOUS
  Filled 2020-04-17: qty 100

## 2020-04-17 MED ORDER — MAGNESIUM SULFATE 2 GM/50ML IV SOLN
2.0000 g | Freq: Once | INTRAVENOUS | Status: AC
  Administered 2020-04-17: 2 g via INTRAVENOUS
  Filled 2020-04-17: qty 50

## 2020-04-17 MED ORDER — POTASSIUM CHLORIDE 10 MEQ/100ML IV SOLN
10.0000 meq | INTRAVENOUS | Status: AC
  Administered 2020-04-17 – 2020-04-18 (×3): 10 meq via INTRAVENOUS
  Filled 2020-04-17 (×3): qty 100

## 2020-04-17 NOTE — Evaluation (Signed)
Physical Therapy Evaluation Patient Details Name: Leah Velasquez MRN: [id number] DOB: Feb 13, 1941 Today's Date: 04/17/2020   History of Present Illness  Pt is a 79 year old female with history of chronic atrial fibrillation, lung cancer status post right lower lobe lobe lobectomy, tobacco abuse, hypertension, hyperlipidemia, obesity, heart failure with preserved ejection fraction, rheumatoid arthritis, and recent Covid infection(ended quarantine on 04/10/2020) presenting after slipping off her toilet seat and getting stuck between her toilet and the shower for approximately 6 hours. Lumbar xray negative for acute fracture.   Clinical Impression  Pt was seen to initiate mobility and is quite dependent and requiring two person help.  Follow up with her to work on getting OOB and sitting in chair.  Follow up with her to get standing and using knees as tolerated.  SNF recommended due to lack of assistance with home care.      Follow Up Recommendations SNF    Equipment Recommendations  None recommended by PT    Recommendations for Other Services       Precautions / Restrictions Precautions Precautions: Fall Precaution Comments: incontinent, knee pain Restrictions Weight Bearing Restrictions: No      Mobility  Bed Mobility Overal bed mobility: Needs Assistance Bed Mobility: Supine to Sit;Sit to Supine;Rolling Rolling: Max assist   Supine to sit: Total assist Sit to supine: Total assist   General bed mobility comments: pt is slow to respond to assist PT and is quite weak, declines to try to stand  Transfers                 General transfer comment: declined  Ambulation/Gait             General Gait Details: declined  Stairs            Wheelchair Mobility    Modified Rankin (Stroke Patients Only)       Balance Overall balance assessment: History of Falls;Needs assistance Sitting-balance support: Feet supported;Bilateral upper extremity supported Sitting  balance-Leahy Scale: Fair                                       Pertinent Vitals/Pain Pain Assessment: Faces Faces Pain Scale: Hurts even more Pain Location: bil knees Pain Descriptors / Indicators: Grimacing;Guarding Pain Intervention(s): Limited activity within patient's tolerance;Repositioned;Monitored during session    Home Living Family/patient expects to be discharged to:: Private residence Living Arrangements: Spouse/significant other Available Help at Discharge: Family Type of Home: Other(Comment) (townhome) Home Access: Level entry     Home Layout: One level Home Equipment: Environmental consultant - 2 wheels;Toilet riser Additional Comments: states her husband is physically able to help her abit     Prior Function Level of Independence: Needs assistance   Gait / Transfers Assistance Needed: RW with independence  ADL's / Homemaking Assistance Needed: has an aide who assists with bathing (bed baths) , spouse assists some with LB dressing, pt doesn't do any driving or community iADL tasks         Hand Dominance   Dominant Hand: Right    Extremity/Trunk Assessment   Upper Extremity Assessment Upper Extremity Assessment: Generalized weakness    Lower Extremity Assessment Lower Extremity Assessment: Generalized weakness    Cervical / Trunk Assessment Cervical / Trunk Assessment: Kyphotic  Communication   Communication: HOH  Cognition Arousal/Alertness: Awake/alert Behavior During Therapy: Flat affect Overall Cognitive Status: No family/caregiver present to determine baseline cognitive functioning  General Comments: repetitive instructions and slow to follow commands      General Comments General comments (skin integrity, edema, etc.): pt is declining to try to move since knees are sore from being assisted off floor from EMT's    Exercises     Assessment/Plan    PT Assessment Patient needs continued PT  services  PT Problem List Decreased strength;Decreased range of motion;Decreased activity tolerance;Decreased balance;Decreased mobility;Decreased coordination;Decreased cognition;Decreased knowledge of use of DME;Decreased safety awareness;Cardiopulmonary status limiting activity;Obesity;Pain       PT Treatment Interventions DME instruction;Gait training;Functional mobility training;Therapeutic activities;Therapeutic exercise;Balance training;Neuromuscular re-education;Patient/family education    PT Goals (Current goals can be found in the Care Plan section)  Acute Rehab PT Goals Patient Stated Goal: less pain PT Goal Formulation: With patient Time For Goal Achievement: 05/01/20 Potential to Achieve Goals: Fair    Frequency Min 3X/week   Barriers to discharge Decreased caregiver support requires two person assist    Co-evaluation               AM-PAC PT "6 Clicks" Mobility  Outcome Measure Help needed turning from your back to your side while in a flat bed without using bedrails?: A Lot Help needed moving from lying on your back to sitting on the side of a flat bed without using bedrails?: A Lot Help needed moving to and from a bed to a chair (including a wheelchair)?: Total Help needed standing up from a chair using your arms (e.g., wheelchair or bedside chair)?: Total Help needed to walk in hospital room?: Total Help needed climbing 3-5 steps with a railing? : Total 6 Click Score: 8    End of Session   Activity Tolerance: Patient limited by fatigue;Patient limited by pain Patient left: in bed;with call bell/phone within reach;with bed alarm set Nurse Communication: Mobility status PT Visit Diagnosis: Muscle weakness (generalized) (M62.81);Other abnormalities of gait and mobility (R26.89);Difficulty in walking, not elsewhere classified (R26.2);Pain Pain - Right/Left:  (both) Pain - part of body: Knee    Time: 1135-1155 PT Time Calculation (min) (ACUTE ONLY): 20  min   Charges:   PT Evaluation $PT Eval Moderate Complexity: 1 Mod         Ramond Dial 04/17/2020, 1:12 PM  Mee Hives, PT MS Acute Rehab Dept. Number: [place] and [place]

## 2020-04-17 NOTE — Progress Notes (Addendum)
ANTICOAGULATION CONSULT NOTE - Initial Consult  Pharmacy Consult for warfarin  Indication: atrial fibrillation  Allergies  Allergen Reactions  . Morphine And Related Anaphylaxis    Vitals drop down and pass out  . Sulfa Antibiotics Rash    Patient Measurements: Height: 5\' 1"  (154.9 cm) Weight: 97.1 kg (214 lb) IBW/kg (Calculated) : 47.8   Vital Signs: Temp: 98 F (36.7 C) (08/27 1137) Temp Source: Oral (08/27 1137) BP: 143/77 (08/27 1137) Pulse Rate: 86 (08/27 1137)  Labs: Recent Labs    04/16/20 0715 04/16/20 0924 04/17/20 0600  HGB 15.4*  --  12.1  HCT 48.0*  --  37.7  PLT 210  --  164  LABPROT 17.2*  --   --   INR 1.5*  --   --   CREATININE 1.36*  --  1.00  CKTOTAL 1,732*  --   --   TROPONINIHS 37* 31*  --     Estimated Creatinine Clearance: 50.2 mL/min (by C-G formula based on SCr of 1 mg/dL).   Medical History: History reviewed. No pertinent past medical history.  Medications:  Medications Prior to Admission  Medication Sig Dispense Refill Last Dose  . [facility] VITAMIN B12 1000 MCG tablet Take 1,000 mcg by mouth daily.   04/15/2020 at Unknown time  . furosemide (LASIX) 40 MG tablet Take 20 mg by mouth daily.   04/15/2020 at Unknown time  . levocetirizine (XYZAL) 5 MG tablet Take 5 mg by mouth daily.   04/15/2020 at Unknown time  . losartan (COZAAR) 50 MG tablet Take 25 mg by mouth daily.   04/15/2020 at Unknown time  . metoprolol succinate (TOPROL-XL) 25 MG 24 hr tablet Take 12.5 mg by mouth at bedtime.    04/15/2020 at 2230  . simvastatin (ZOCOR) 20 MG tablet Take 20 mg by mouth at bedtime.   04/15/2020 at Unknown time  . timolol (TIMOPTIC) 0.5 % ophthalmic solution Place 1 drop into both eyes at bedtime.   04/15/2020 at Unknown time  . traMADol (ULTRAM) 50 MG tablet Take 50 mg by mouth daily as needed for moderate pain.    Past Week at Unknown time  . warfarin (COUMADIN) 5 MG tablet Take 2.5-5 mg by mouth See admin instructions. INR visit 8/18: on 8/26 take 1/2  tablet (2.5mg ), on 8/27 1 tab(5mg ), on 8/28 take 1/2 tab (2.5mg ) , on 8/29 take 1/2 tab (2.5mg ) then 8/30 follow up INR visit   04/15/2020 at Unknown time  . zolpidem (AMBIEN) 5 MG tablet Take 5 mg by mouth at bedtime as needed for sleep.   04/15/2020 at Unknown time   Scheduled:  . diclofenac Sodium  4 g Topical QID  . [START ON 04/18/2020] furosemide  20 mg Oral Daily  . heparin  5,000 Units Subcutaneous Q8H  . losartan  25 mg Oral Daily  . metoprolol succinate  12.5 mg Oral Daily  . potassium chloride  40 mEq Oral BID  . simvastatin  20 mg Oral QHS  . timolol  1 drop Both Eyes Daily    Assessment: 79 yo female con coumadin PTA for history of afib (CHADSVASC= 4). Pharmacy has been consulted to dose. Last dose of coumadin was taken on 8/25. She is noted on SQ heparin. -INR= 1.5 on 8/26  Home dose: Was to take 5mg  8/27, 2.5mg  on 8/28 and 8/29 then follow-up with coumadin clinic.   Goal of Therapy:  INR= 2-3 Monitor platelets by anticoagulation protocol: Yes   Plan:  -Warfarin 5mg  po  today -Daily PT/INR -d/c sq heparin when INR > 2  Hildred Laser, PharmD Clinical Pharmacist **Pharmacist phone directory can now be found on [email or web address] ([email or web address]).  Listed under [place].

## 2020-04-17 NOTE — Progress Notes (Signed)
  Date: 04/17/2020  Patient name: Leah Velasquez  Medical record number: [id number]  Date of birth: Dec 23, 1940   I have seen and evaluated Leah Velasquez and discussed their care with the Residency Team. Briefly, Leah Velasquez presented after sliding off commode and being stuck in between commode and shower wall.  She noted achy pain today, but no bruising.  She has recently had COVID and a new diagnosis of lung cancer.  She is unsteady at baseline and uses a walker.    Vitals:   04/17/20 0806 04/17/20 1137  BP: (!) 144/70 (!) 143/77  Pulse: 95 86  Resp: 18 18  Temp: 98.2 F (36.8 C) 98 F (36.7 C)  SpO2: 95% 95%   General: Elderly woman, appears fatigued Eyes: Anicteric sclerae, no conjunctival injectoin HENT: Neck is supple, NCAT CV: RR, NR, no murmur Pulm: Clear anteriorly, no wheezing Abd: Obese, soft, NT MSK: Normal tone, she has bilateral mid line scars to knees.  Skin: Pale, no rash on exposed skin Neuro: Strength and sensation are intact.  She has no facial droop or change in phonation Psych: Appears very fatigued.   Assessment and Plan: I have seen and evaluated the patient as outlined above. I agree with the formulated Assessment and Plan as detailed in the residents' note, with the following changes:   1. Generalized weakness, disequilibrium - Trial of fluids with improved renal function - B12 level normal - Trend CK - PT/OT --> May need rehab  2. Lung carcinoma - Patient would like [place] to let her know when her appointment is, which we will do today.   Other issues per Leah Velasquez daily note.   Leah Falcon, MD 8/27/20212:22 PM

## 2020-04-17 NOTE — Evaluation (Addendum)
Occupational Therapy Evaluation Patient Details Name: Leah Velasquez MRN: [id number] DOB: Mar 17, 1941 Today's Date: 04/17/2020    History of Present Illness Pt is a 79 year old female with history of chronic atrial fibrillation, lung cancer status post right lower lobe lobe lobectomy, tobacco abuse, hypertension, hyperlipidemia, obesity, heart failure with preserved ejection fraction, rheumatoid arthritis, and recent Covid infection(ended quarantine on 04/10/2020) presenting after slipping off her toilet seat and getting stuck between her toilet and the shower for approximately 6 hours. Lumbar xray negative for acute fracture.    Clinical Impression   This 79 y/o female presents with the above. PTA pt reports using RW for short distance mobility (household), reports receives assist with some ADL (from spouse and aide). Pt currently with limitations including bil knee pain, weakness, fatigue and overall decrease in mobility status. Pt currently requiring maxA for bed mobility and up to [place] for LB/toileting ADL at bed level after episode of incontinence. Pt declined any attempts at EOB or OOB during session even when presented with option of have +2 assist. She will benefit from continued acute OT services and given current status recommend SNF level therapies at time of discharge to maximize her safety and independence with ADL and mobility.     Follow Up Recommendations  SNF;Supervision/Assistance - 24 hour    Equipment Recommendations  3 in 1 bedside commode;Other (comment) (TBD)           Precautions / Restrictions Precautions Precautions: Fall Precaution Comments: incontinent  Restrictions Weight Bearing Restrictions: No      Mobility Bed Mobility Overal bed mobility: Needs Assistance Bed Mobility: Rolling Rolling: Max assist         General bed mobility comments: rolling to L/R for pericare/changing bed linens   Transfers                 General transfer comment:  pt declined attempting with therapist, stating it would take two people, however presented pt with option of having +2 assist and she continues to decline attempts     Balance                                           ADL either performed or assessed with clinical judgement   ADL Overall ADL's : Needs assistance/impaired Eating/Feeding: Set up;Bed level   Grooming: Set up;Bed level   Upper Body Bathing: Maximal assistance;Bed level   Lower Body Bathing: Total assistance   Upper Body Dressing : Maximal assistance;Bed level           Toileting- Clothing Manipulation and Hygiene: Total assistance;Bed level Toileting - Clothing Manipulation Details (indicate cue type and reason): pt incontinent of b/b; requiring assist for pericare/changing bed linens start of session        General ADL Comments: pt only able to participate in bed level assessment - declined EOB or OOB even when presented with option of having +2 assist                          Pertinent Vitals/Pain Pain Assessment: Faces Faces Pain Scale: Hurts little more Pain Location: bil knees Pain Descriptors / Indicators: Discomfort;Grimacing;Sore Pain Intervention(s): Limited activity within patient's tolerance;Monitored during session;Repositioned;Premedicated before session     Hand Dominance     Extremity/Trunk Assessment Upper Extremity Assessment Upper Extremity Assessment: Generalized weakness   Lower Extremity Assessment Lower  Extremity Assessment: Defer to PT evaluation       Communication Communication Communication: HOH   Cognition Arousal/Alertness: Awake/alert Behavior During Therapy: Flat affect Overall Cognitive Status: No family/caregiver present to determine baseline cognitive functioning                                 General Comments: following majority of basic commands; appears to be accurate historian    General Comments       Exercises      Shoulder Instructions      Home Living Family/patient expects to be discharged to:: Private residence Living Arrangements: Spouse/significant other Available Help at Discharge: Family Type of Home: Other(Comment) (townhome) Home Access: Level entry     Home Layout: One level     Bathroom Shower/Tub: Tub/shower unit;Walk-in shower   Bathroom Toilet: Standard (has toilet riser )     Home Equipment: Walker - 2 wheels;Toilet riser          Prior Functioning/Environment Level of Independence: Needs assistance  Gait / Transfers Assistance Needed: reports typically mobilizing household distance with RW ADL's / Homemaking Assistance Needed: has an aide who assists with bathing (bed baths) , spouse assists some with LB dressing, pt doesn't do any driving or community iADL tasks             OT Problem List: Decreased strength;Decreased range of motion;Decreased activity tolerance;Obesity;Cardiopulmonary status limiting activity;Decreased knowledge of precautions;Decreased knowledge of use of DME or AE;Impaired balance (sitting and/or standing);Pain      OT Treatment/Interventions: Self-care/ADL training;Therapeutic exercise;Energy conservation;DME and/or AE instruction;Therapeutic activities;Patient/family education;Balance training    OT Goals(Current goals can be found in the care plan section) Acute Rehab OT Goals Patient Stated Goal: less pain OT Goal Formulation: With patient Time For Goal Achievement: 05/01/20 Potential to Achieve Goals: Fair  OT Frequency: Min 2X/week   Barriers to D/C:            Co-evaluation              AM-PAC OT "6 Clicks" Daily Activity     Outcome Measure Help from another person eating meals?: A Little Help from another person taking care of personal grooming?: A Little Help from another person toileting, which includes using toliet, bedpan, or urinal?: Total Help from another person bathing (including washing, rinsing,  drying)?: A Lot Help from another person to put on and taking off regular upper body clothing?: A Lot Help from another person to put on and taking off regular lower body clothing?: Total 6 Click Score: 12   End of Session Nurse Communication: Mobility status;Need for lift equipment (pt needs new sacral pad)  Activity Tolerance: Other (comment) (pt self limiting ) Patient left: in bed;with call bell/phone within reach  OT Visit Diagnosis: Other abnormalities of gait and mobility (R26.89);History of falling (Z91.81);Muscle weakness (generalized) (M62.81)                Time: 4818-5631 OT Time Calculation (min): 33 min Charges:  OT General Charges $OT Visit: 1 Visit OT Evaluation $OT Eval Moderate Complexity: 1 Mod OT Treatments $Self Care/Home Management : 8-22 mins  Leah Velasquez, OT Acute Rehabilitation Services Pager [phone] Office [phone]   Raymondo Band 04/17/2020, 10:25 AM

## 2020-04-17 NOTE — Progress Notes (Signed)
HD#0 Subjective:   No acute events overnight.  Patient is seen at bedside. She appears in mild distress. She complains of sore ness in her knees, legs, neck and shoulders. States she is thirsty, is drinking well. Looking forward to the meal she just ordered.   Reiterates that after slipping off of the toilet, she did not hit or head or lose consciousness. She felt too weak to get up, even with her walker in reach. She feels fortunate to have her husband around at home to help her. Uses a walker to ambulate at  Home. Has a home health aid to help with bathing and some ADLs. Denies bruising, lower urinary tract symptoms, dizziness. Has not gotten OOB since coming to the hospital.  With regard to her current fatigue/weakness, she feels like it started around the time that she tested positive for Covid before getting what was supposed to be her second lung biopsy.    Objective:  Vital signs in last 24 hours: Vitals:   04/17/20 0000 04/17/20 0316 04/17/20 0806 04/17/20 1137  BP:  139/69 (!) 144/70 (!) 143/77  Pulse:  79 95 86  Resp:  19 18 18   Temp:  97.8 F (36.6 C) 98.2 F (36.8 C) 98 F (36.7 C)  TempSrc:  Oral Oral Oral  SpO2:  94% 95% 95%  Weight: 97.1 kg     Height: 5\' 1"  (1.549 m)       Physical Exam:   Constitutional: tired-appearing woman lying in bed, in mild distress secondary to "total body sore"ness Cardiovascular: irregularly irregular, no m/r/g Pulmonary/Chest: normal work of breathing, clear to auscultation bilaterally Abdominal: soft, non-tender, non-distended MSK: tender to palpation over bilateral knees, trace bilateral lower extremity edema around ankles to mid shin Neurological: alert & oriented x 3, 5/5 strength in bilateral upper extremities, 3/5 bilateral lower extremity strength limited by effort Skin: no bruising or rashes  Filed Weights   04/16/20 0647 04/17/20 0000  Weight: 106.6 kg 97.1 kg    Intake/Output Summary (Last 24 hours) at 04/17/2020  1406 Last data filed at 04/17/2020 0900 Gross per 24 hour  Intake 160 ml  Output 0 ml  Net 160 ml   Net IO Since Admission: 1,360 mL [04/17/20 1406]  Pertinent Labs: CBC Latest Ref Rng & Units 04/17/2020 04/16/2020  WBC 4.0 - 10.5 K/uL 6.5 10.7(H)  Hemoglobin 12.0 - 15.0 g/dL 12.1 15.4(H)  Hematocrit 36 - 46 % 37.7 48.0(H)  Platelets 150 - 400 K/uL 164 210    CMP Latest Ref Rng & Units 04/17/2020 04/16/2020  Glucose 70 - 99 mg/dL 79 104(H)  BUN 8 - 23 mg/dL 11 12  Creatinine 0.44 - 1.00 mg/dL 1.00 1.36(H)  Sodium 135 - 145 mmol/L 139 140  Potassium 3.5 - 5.1 mmol/L 2.9(L) 3.3(L)  Chloride 98 - 111 mmol/L 104 102  CO2 22 - 32 mmol/L 25 20(L)  Calcium 8.9 - 10.3 mg/dL 8.7(L) 9.6  Total Protein 6.5 - 8.1 g/dL - 7.0  Total Bilirubin 0.3 - 1.2 mg/dL - 1.2  Alkaline Phos 38 - 126 U/L - 99  AST 15 - 41 U/L - 49(H)  ALT 0 - 44 U/L - 26   Imaging: none today  Assessment/Plan:   Active Problems:   Generalized weakness  Patient Summary:  Leah Velasquez is a 79 yo woman with history of chronic atrial fibrillation on Coumadin, lung cancer s/pt right lower lobe lobe lobectomy, tobacco abuse, hypertension, hyperlipidemia, obesity, heart failure with preserved ejection fraction,  rheumatoid arthritis, and recent Covid infection (ended quarantine on 04/10/2020) who presented on 04/16/20 after slipping off her toilet seat and getting stuck between her toilet and the shower for approximately 6 hours. This is hospital day 1.    Generalized weakness/disequilibrium Likely multifactorial, due to recent COVID infection, lung cancer recurrence, history of heart failure, overall deconditioning, electrolyte imbalances (see below). Evaluated by PT/OT today who recommend SNF for continued rehabilitation. - PT/OT evaluation: both recommending SNF - Repleting electrolytes - Encourage PO - AM BMP, Mg  AKI, resolving Creatinine down to 1.00 from 1.36 on admission. S/p 1L NS bolus yesterday.  - Encourage  PO - AM BMP  Lung carcinoma Following with cardiothoracic surgery for biopsy which was postponed due to COVID infection.  - Will ensure patient has scheduled follow-up after discharge  Atrial fibrillation - warfarin per pharmacy - metoprolol 12.5 mg daily  Hypertension - furosemide 20 mg daily (home dose: 40 mg daily) - losartan 25 mg daily - metoprolol 12.5 mg daily  Heart failure with preserved EF - furosemide 20 mg daily (home dose: 40 mg daily) - losartan 25 mg daily - metoprolol 12.5 mg daily  Diet: Heart Healthy IVF: None,None VTE: Heparin Code: Full PT/OT recs: SNF for Subacute PT, bedside commode.   Alexandria Lodge, MD [place], PGY-1 Date 04/17/2020 Time 2:06 PM Pager: [phone] Please contact the on call pager after 5 pm and on weekends at [phone].

## 2020-04-17 NOTE — Progress Notes (Signed)
Concern about UA result, pt denies pain upon urination-pt is incontinent. WBC 10.7. VS stable as well as temp. Asymptomatic. Spoke to Dr Wynetta Emery, will hold antbx for now.  Will monitor.

## 2020-04-18 DIAGNOSIS — C3411 Malignant neoplasm of upper lobe, right bronchus or lung: Secondary | ICD-10-CM | POA: Diagnosis not present

## 2020-04-18 DIAGNOSIS — R531 Weakness: Secondary | ICD-10-CM | POA: Diagnosis not present

## 2020-04-18 LAB — PROTIME-INR
INR: 2.4 — ABNORMAL HIGH (ref 0.8–1.2)
Prothrombin Time: 25.7 seconds — ABNORMAL HIGH (ref 11.4–15.2)

## 2020-04-18 LAB — BASIC METABOLIC PANEL
Anion gap: 7 (ref 5–15)
BUN: 10 mg/dL (ref 8–23)
CO2: 24 mmol/L (ref 22–32)
Calcium: 8.6 mg/dL — ABNORMAL LOW (ref 8.9–10.3)
Chloride: 106 mmol/L (ref 98–111)
Creatinine, Ser: 0.85 mg/dL (ref 0.44–1.00)
GFR calc Af Amer: >60 mL/min (ref >60)
GFR calc non Af Amer: >60 mL/min (ref >60)
Glucose, Bld: 89 mg/dL (ref 70–99)
Potassium: 4.8 mmol/L (ref 3.5–5.1)
Sodium: 137 mmol/L (ref 135–145)

## 2020-04-18 LAB — CBC
HCT: 37.8 % (ref 36.0–46.0)
Hemoglobin: 12.4 g/dL (ref 12.0–15.0)
MCH: 31.4 pg (ref 26.0–34.0)
MCHC: 32.8 g/dL (ref 30.0–36.0)
MCV: 95.7 fL (ref 80.0–100.0)
Platelets: 182 10*3/uL (ref 150–400)
RBC: 3.95 MIL/uL (ref 3.87–5.11)
RDW: 14.6 % (ref 11.5–15.5)
WBC: 5.9 10*3/uL (ref 4.0–10.5)
nRBC: 0 % (ref 0.0–0.2)

## 2020-04-18 LAB — CK: Total CK: 1835 U/L — ABNORMAL HIGH (ref 38–234)

## 2020-04-18 LAB — MAGNESIUM: Magnesium: 2.2 mg/dL (ref 1.7–2.4)

## 2020-04-18 MED ORDER — WARFARIN - PHARMACIST DOSING INPATIENT: Freq: Every day | Status: DC

## 2020-04-18 NOTE — Progress Notes (Addendum)
[facility] for warfarin  Indication: atrial fibrillation  Allergies  Allergen Reactions  . Morphine And Related Anaphylaxis    Vitals drop down and pass out  . Sulfa Antibiotics Rash    Patient Measurements: Height: 5\' 1"  (154.9 cm) Weight: 97.5 kg (215 lb) IBW/kg (Calculated) : 47.8   Vital Signs: Temp: 97.7 F (36.5 C) (08/28 0826) Temp Source: Oral (08/28 0826) BP: 141/82 (08/28 0826) Pulse Rate: 81 (08/28 0826)  Labs: Recent Labs    04/16/20 0715 04/16/20 0715 04/16/20 0924 04/17/20 0600 04/17/20 0600 04/17/20 1318 04/17/20 2132 04/18/20 0239  HGB 15.4*   < >  --  12.1  --   --   --  12.4  HCT 48.0*  --   --  37.7  --   --   --  37.8  PLT 210  --   --  164  --   --   --  182  LABPROT 17.2*  --   --   --   --   --   --  25.7*  INR 1.5*  --   --   --   --   --   --  2.4*  CREATININE 1.36*   < >  --  1.00   < > 0.99 0.90 0.85  CKTOTAL 1,732*  --   --   --   --   --   --  1,835*  TROPONINIHS 37*  --  31*  --   --   --   --   --    < > = values in this interval not displayed.    Estimated Creatinine Clearance: 59.2 mL/min (by C-G formula based on SCr of 0.85 mg/dL).   Medical History: History reviewed. No pertinent past medical history.  Medications:  Medications Prior to Admission  Medication Sig Dispense Refill Last Dose  . CVS VITAMIN B12 1000 MCG tablet Take 1,000 mcg by mouth daily.   04/15/2020 at Unknown time  . furosemide (LASIX) 40 MG tablet Take 20 mg by mouth daily.   04/15/2020 at Unknown time  . levocetirizine (XYZAL) 5 MG tablet Take 5 mg by mouth daily.   04/15/2020 at Unknown time  . losartan (COZAAR) 50 MG tablet Take 25 mg by mouth daily.   04/15/2020 at Unknown time  . metoprolol succinate (TOPROL-XL) 25 MG 24 hr tablet Take 12.5 mg by mouth at bedtime.    04/15/2020 at 2230  . simvastatin (ZOCOR) 20 MG tablet Take 20 mg by mouth at bedtime.   04/15/2020 at Unknown time  . timolol (TIMOPTIC) 0.5 % ophthalmic  solution Place 1 drop into both eyes at bedtime.   04/15/2020 at Unknown time  . traMADol (ULTRAM) 50 MG tablet Take 50 mg by mouth daily as needed for moderate pain.    Past Week at Unknown time  . warfarin (COUMADIN) 5 MG tablet Take 2.5-5 mg by mouth See admin instructions. INR visit 8/18: on 8/26 take 1/2 tablet (2.5mg ), on 8/27 1 tab(5mg ), on 8/28 take 1/2 tab (2.5mg ) , on 8/29 take 1/2 tab (2.5mg ) then 8/30 follow up INR visit   04/15/2020 at Unknown time  . zolpidem (AMBIEN) 5 MG tablet Take 5 mg by mouth at bedtime as needed for sleep.   04/15/2020 at Unknown time   Scheduled:  . diclofenac Sodium  4 g Topical QID  . furosemide  20 mg Oral Daily  . heparin  5,000 Units Subcutaneous Q8H  . losartan  25 mg Oral Daily  . metoprolol succinate  12.5 mg Oral Daily  . simvastatin  20 mg Oral QHS  . timolol  1 drop Both Eyes Daily  . Warfarin - Pharmacist Dosing Inpatient   Does not apply q1600    Assessment: 79 yo female con coumadin PTA for history of afib (CHADSVASC= 4). Pharmacy has been consulted to dose. Last dose of coumadin was taken on 8/25. She is noted on SQ heparin.  Home dose: Was to take 5mg  8/27, 2.5mg  on 8/28 and 8/29 then follow-up with coumadin clinic. Per patient she has been on warfarin for 1 year and her average dose is 2.5mg /day.  Today her INR is therapeutic at 2.4. Lung biopsy planned for 9/2.  Goal of Therapy:  INR= 2-3 Monitor platelets by anticoagulation protocol: Yes   Plan:  -Hold warfarin for lung biopsy on 9/2 per Modena Nunnery D, DO -Resume daily PT/INR when warfarin restarted -continue sq heparin while holding warfarin -Pharmacy will follow after biopsy  Norina Buzzard, PharmD PGY1 Pharmacy Resident 04/18/2020 9:51 AM  **Pharmacist phone directory can now be found on [email or web address] (PW [email or web address]).  Listed under [place].

## 2020-04-18 NOTE — Progress Notes (Signed)
HD#1 Subjective:   No acute events overnight. Leah Velasquez wasn't feeling well this morning, having some diarrhea. We discussed the physical therapist's recommendations for SNF. She says she was just at [facility] for 20 days and does not want to go back to rehab, explains that she knows what she has to do and just has to do it. She would prefer to continue rehab at home. We explained our concerns with this based on how weak she appears, but she thinks she will improve faster at home. Shares that she has home health services with [facility]. Reports that someone comes out to her house 3-4x/week. She is amenable to [place] giving her husband a call to see what he thinks about her going home. States she would prefer to go home today.  Overall, feels better compared to yesterday in terms of energy and weakness. States the voltaren gel has been very helpful on her knees. Encouraged her to get out of bed today.   Objective:  Vital signs in last 24 hours: Vitals:   04/18/20 0105 04/18/20 0217 04/18/20 0527 04/18/20 0826  BP: 131/85  (!) 156/83 (!) 141/82  Pulse: 73  70 81  Resp: 18  18 18   Temp: 97.8 F (36.6 C)  97.8 F (36.6 C) 97.7 F (36.5 C)  TempSrc: Oral  Oral Oral  SpO2: 99%  96% 93%  Weight:  97.5 kg    Height:        Physical Exam:   Constitutional: tired-appearing woman lying in bed, appears more comfortable today, in no acute distress Cardiovascular: irregularly irregular, no m/r/g Pulmonary/Chest: normal work of breathing, clear to auscultation bilaterally Abdominal: soft, non-tender, non-distended MSK: mild tenderness to palpation over bilateral knees, trace bilateral lower extremity edema around ankles to mid shin Neurological: alert & oriented x 3, 5/5 strength in bilateral upper extremities, 3/5 bilateral lower extremity strength limited by effort Skin: no bruising or rashes  Filed Weights   04/16/20 0647 04/17/20 0000 04/18/20 0217  Weight: 106.6 kg 97.1 kg 97.5 kg     Intake/Output Summary (Last 24 hours) at 04/18/2020 1256 Last data filed at 04/18/2020 0829 Gross per 24 hour  Intake 360 ml  Output 501 ml  Net -141 ml   Net IO Since Admission: 1,219 mL [04/18/20 1256]  Pertinent Labs: CBC Latest Ref Rng & Units 04/18/2020 04/17/2020 04/16/2020  WBC 4.0 - 10.5 K/uL 5.9 6.5 10.7(H)  Hemoglobin 12.0 - 15.0 g/dL 12.4 12.1 15.4(H)  Hematocrit 36 - 46 % 37.8 37.7 48.0(H)  Platelets 150 - 400 K/uL 182 164 210    CMP Latest Ref Rng & Units 04/18/2020 04/17/2020 04/17/2020  Glucose 70 - 99 mg/dL 89 101(H) 110(H)  BUN 8 - 23 mg/dL 10 11 12   Creatinine 0.44 - 1.00 mg/dL 0.85 0.90 0.99  Sodium 135 - 145 mmol/L 137 137 137  Potassium 3.5 - 5.1 mmol/L 4.8 3.9 2.9(L)  Chloride 98 - 111 mmol/L 106 104 103  CO2 22 - 32 mmol/L 24 26 24   Calcium 8.9 - 10.3 mg/dL 8.6(L) 8.5(L) 8.3(L)  Total Protein 6.5 - 8.1 g/dL - - -  Total Bilirubin 0.3 - 1.2 mg/dL - - -  Alkaline Phos 38 - 126 U/L - - -  AST 15 - 41 U/L - - -  ALT 0 - 44 U/L - - -   Imaging: none   Assessment/Plan:   Active Problems:   Generalized weakness   Non-traumatic rhabdomyolysis  Patient Summary:  Leah Velasquez  is a 79 yo woman with history of chronic atrial fibrillation on Coumadin, lung cancer s/pt right lower lobe lobe lobectomy, tobacco abuse, hypertension, hyperlipidemia, obesity, heart failure with preserved ejection fraction, rheumatoid arthritis, and recent Covid infection (ended quarantine on 04/10/2020) who presented on 04/16/20 after slipping off her toilet seat and getting stuck between her toilet and the shower for approximately 6 hours.   This is hospital day 2.   Generalized weakness/disequilibrium Likely multifactorial, due to recent COVID infection, lung cancer recurrence, history of heart failure, overall deconditioning, electrolyte imbalances. PT/OT recommending SNF for continued rehabilitation, however the patient would prefer to continue home health services at home. Possible  discharge home today vs pending discussion with patient's husband. - PT/OT evaluation: both recommending SNF - Repleting electrolytes - Encourage PO - AM BMP, Mg  AKI, resolved Creatinine down to baseline, 0.85, from 1.36 on admission. - Encourage PO - AM BMP  Lung carcinoma Following with cardiothoracic surgery for biopsy which was postponed due to COVID infection.  - Currently scheduled for biopsy by CT surgery Dr. Roxan Hockey on 04/23/20. Instructed to hold warfarin 5 d prior.   Atrial fibrillation - Pharmacy following, appreciate their assistance  - INR therapeutic at 2.4  - Warfarin will be held for lung biopsy on 9/2  - Resume daily PT/INR when warfarin restarted  - Continue sq heparin while holding warfarin - metoprolol 12.5 mg daily  Hypertension - furosemide 20 mg daily (home dose: 40 mg daily) - losartan 25 mg daily - metoprolol 12.5 mg daily  Heart failure with preserved EF - furosemide 20 mg daily (home dose: 40 mg daily) - losartan 25 mg daily - metoprolol 12.5 mg daily  Diet: Heart Healthy IVF: None,None VTE: Heparin Code: Full PT/OT recs: SNF for Subacute PT, bedside commode.   Alexandria Lodge, MD [place], PGY-1 Date 04/18/2020 Time 12:56 PM Pager: [phone] Please contact the on call pager after 5 pm and on weekends at [phone].

## 2020-04-18 NOTE — Care Management (Signed)
Verified w Alvis Lemmings that she is active for RN PT OT SW

## 2020-04-19 DIAGNOSIS — R4181 Age-related cognitive decline: Secondary | ICD-10-CM

## 2020-04-19 DIAGNOSIS — N189 Chronic kidney disease, unspecified: Secondary | ICD-10-CM

## 2020-04-19 DIAGNOSIS — N952 Postmenopausal atrophic vaginitis: Secondary | ICD-10-CM | POA: Diagnosis not present

## 2020-04-19 DIAGNOSIS — R3915 Urgency of urination: Secondary | ICD-10-CM | POA: Diagnosis not present

## 2020-04-19 DIAGNOSIS — K529 Noninfective gastroenteritis and colitis, unspecified: Secondary | ICD-10-CM

## 2020-04-19 DIAGNOSIS — Z720 Tobacco use: Secondary | ICD-10-CM

## 2020-04-19 DIAGNOSIS — C3411 Malignant neoplasm of upper lobe, right bronchus or lung: Secondary | ICD-10-CM | POA: Diagnosis not present

## 2020-04-19 DIAGNOSIS — R531 Weakness: Secondary | ICD-10-CM | POA: Diagnosis not present

## 2020-04-19 DIAGNOSIS — E878 Other disorders of electrolyte and fluid balance, not elsewhere classified: Secondary | ICD-10-CM | POA: Diagnosis not present

## 2020-04-19 DIAGNOSIS — M6282 Rhabdomyolysis: Secondary | ICD-10-CM | POA: Diagnosis not present

## 2020-04-19 LAB — BASIC METABOLIC PANEL
Anion gap: 11 (ref 5–15)
BUN: 11 mg/dL (ref 8–23)
CO2: 22 mmol/L (ref 22–32)
Calcium: 8.6 mg/dL — ABNORMAL LOW (ref 8.9–10.3)
Chloride: 104 mmol/L (ref 98–111)
Creatinine, Ser: 0.77 mg/dL (ref 0.44–1.00)
GFR calc Af Amer: >60 mL/min (ref >60)
GFR calc non Af Amer: >60 mL/min (ref >60)
Glucose, Bld: 88 mg/dL (ref 70–99)
Potassium: 3.6 mmol/L (ref 3.5–5.1)
Sodium: 137 mmol/L (ref 135–145)

## 2020-04-19 LAB — MAGNESIUM: Magnesium: 1.8 mg/dL (ref 1.7–2.4)

## 2020-04-19 MED ORDER — FLUTICASONE PROPIONATE 50 MCG/ACT NA SUSP
1.0000 | Freq: Every day | NASAL | Status: DC
  Administered 2020-04-20 – 2020-04-23 (×3): 1 via NASAL
  Filled 2020-04-19: qty 16

## 2020-04-19 MED ORDER — ALBUTEROL SULFATE HFA 108 (90 BASE) MCG/ACT IN AERS: 1.0000 | INHALATION_SPRAY | RESPIRATORY_TRACT | Status: DC | PRN

## 2020-04-19 MED ORDER — MELATONIN 5 MG PO TABS
5.0000 mg | ORAL_TABLET | Freq: Every evening | ORAL | Status: DC | PRN
  Administered 2020-04-20: 5 mg via ORAL
  Filled 2020-04-19: qty 1

## 2020-04-19 MED ORDER — DARIFENACIN HYDROBROMIDE ER 7.5 MG PO TB24
7.5000 mg | ORAL_TABLET | Freq: Every day | ORAL | Status: DC
  Administered 2020-04-19 – 2020-04-23 (×4): 7.5 mg via ORAL
  Filled 2020-04-19 (×5): qty 1

## 2020-04-19 MED ORDER — ALBUTEROL SULFATE (2.5 MG/3ML) 0.083% IN NEBU
2.5000 mg | INHALATION_SOLUTION | RESPIRATORY_TRACT | Status: DC | PRN
  Administered 2020-04-19 – 2020-04-20 (×3): 2.5 mg via RESPIRATORY_TRACT
  Filled 2020-04-19 (×4): qty 3

## 2020-04-19 MED ORDER — ESTROGENS, CONJUGATED 0.625 MG/GM VA CREA
1.0000 | TOPICAL_CREAM | VAGINAL | Status: DC
  Administered 2020-04-19 – 2020-04-23 (×3): 1 via VAGINAL
  Filled 2020-04-19: qty 30

## 2020-04-19 MED ORDER — LOPERAMIDE HCL 2 MG PO CAPS: 2.0000 mg | ORAL_CAPSULE | ORAL | Status: DC | PRN

## 2020-04-19 NOTE — Progress Notes (Signed)
HD#2 Subjective:   Spoke with the patient's husband and daughter on speaker phone yesterday afternoon. Discussed PT/OT recs for subacute rehab at a SNF. Husband is 79 years old, and though he cooks and cleans at home, he is not able to help Leah Velasquez with transfers. Daughter supportive of the patient going to SNF. Daughter shared that the patient has been confused at times over the last few months. Reports she will say "off the wall things" then self-correct when she realizes something she said may not make much sense. Daughter is aware the patient was recently treated for UTI however the patient manages her own medications so she does not know what she completed specifically.  No acute events overnight.   This morning, she continues to have issues with diarrhea. Denies abdominal pain. She endorses burning with urination the last 3 days. She has had 2 recent courses of antibiotics, neither of which helped her symptoms. She has used estrogen cream in the past which helps. Also reports ongoing issues with urinary urgency, stating when she feels like she has to urinate, her urine often "just comes out," leading to accidents.   Obtained further history regarding her functional status. Leah Velasquez reports she pays bills, drives, does her own grocery shopping. She is able to voice what brought her into the hospital. Until she slid off the toilet a few days ago, she had not fallen in a few months. She typically uses a walker and wheelchair to get around. We discussed our concerns about her returning home because of her weakness and that her family shares these concerns. She is willing to consider rehab if she is able to go to a different one than where she just stayed.   Objective:  Vital signs in last 24 hours: Vitals:   04/18/20 1818 04/18/20 1938 04/19/20 0440 04/19/20 1110  BP: (!) 170/96 140/78 (!) 156/84 (!) 144/79  Pulse: 79 85 78 70  Resp: 18 20 20 18   Temp: 98.1 F (36.7 C) 97.7 F (36.5  C) 97.6 F (36.4 C) 97.7 F (36.5 C)  TempSrc: Oral Oral Oral Oral  SpO2: 97% 94% 96% 97%  Weight:      Height:        Physical Exam:   Constitutional: appears more rested and comfortable this morning, in no acute distress Pulmonary/Chest: mildy dyspneic with speaking, prefers to sit more upright in bed MSK: mild tenderness to palpation over bilateral knees, trace bilateral lower extremity edema around ankles to mid shin Neurological: alert & oriented x 3, unable to move herself up in bed but rather requires 2-person assist to slide up  [facility] Weights   04/16/20 0647 04/17/20 0000 04/18/20 0217  Weight: 106.6 kg 97.1 kg 97.5 kg    Intake/Output Summary (Last 24 hours) at 04/19/2020 1235 Last data filed at 04/19/2020 5573 Gross per 24 hour  Intake 355 ml  Output 851 ml  Net -496 ml   Net IO Since Admission: 1,181 mL [04/19/20 1235]  Pertinent Labs: CBC Latest Ref Rng & Units 04/18/2020 04/17/2020 04/16/2020  WBC 4.0 - 10.5 K/uL 5.9 6.5 10.7(H)  Hemoglobin 12.0 - 15.0 g/dL 12.4 12.1 15.4(H)  Hematocrit 36 - 46 % 37.8 37.7 48.0(H)  Platelets 150 - 400 K/uL 182 164 210    CMP Latest Ref Rng & Units 04/19/2020 04/18/2020 04/17/2020  Glucose 70 - 99 mg/dL 88 89 101(H)  BUN 8 - 23 mg/dL 11 10 11   Creatinine 0.44 - 1.00 mg/dL 0.77 0.85  0.90  Sodium 135 - 145 mmol/L 137 137 137  Potassium 3.5 - 5.1 mmol/L 3.6 4.8 3.9  Chloride 98 - 111 mmol/L 104 106 104  CO2 22 - 32 mmol/L 22 24 26   Calcium 8.9 - 10.3 mg/dL 8.6(L) 8.6(L) 8.5(L)  Total Protein 6.5 - 8.1 g/dL - - -  Total Bilirubin 0.3 - 1.2 mg/dL - - -  Alkaline Phos 38 - 126 U/L - - -  AST 15 - 41 U/L - - -  ALT 0 - 44 U/L - - -    Assessment/Plan:   Active Problems:   Generalized weakness   Non-traumatic rhabdomyolysis  Patient Summary:  Leah Velasquez is a 79 yo woman with history of chronic atrial fibrillation on Coumadin, lung cancer s/pt right lower lobe lobe lobectomy, tobacco abuse, hypertension, hyperlipidemia,  obesity, heart failure with preserved ejection fraction, rheumatoid arthritis, and recent Covid infection (ended quarantine on 04/10/2020) who presented on 04/16/20 after slipping off her toilet seat and getting stuck between her toilet and the shower for approximately 6 hours.   This is hospital day 3.   Generalized weakness/disequilibrium Likely multifactorial, due to recent COVID infection, lung cancer recurrence, history of heart failure, overall deconditioning, electrolyte imbalances. PT/OT recommending SNF for continued rehabilitation. Patient is amenable to exploring SNF options. She does not wish to [facility] to [facility].  - PT/OT evaluation: both recommending SNF - Repleting electrolytes as necessary - Encourage PO - AM BMP, Mg  Vaginal atrophy Endorses dysuria for the last 3 days. Denies abdominal pain, no suprapubic tenderness. She has had 2 recent courses of antibiotics, neither of which helped her symptoms. She has used estrogen cream in the past which helps.  - Premarin every other day  Urinary urgency Reports ongoing issues with urinary urgency, stating when she feels like she has to urinate, her urine often "just comes out," leading to accidents. - Trial of darifenacin (Enablex)   Mild Cognitive Impairment Patient's family reports recent history of mild forgetfulness. Patient has displayed intermittent confusion during this admission.  - Delirium precautions  Chronic diarrhea Patient reports 3-5 episodes of watery diarrhea since admission. No abdominal pain or tenderness. Speaking with family, the patient reportedly has endorsed diarrhea for several years. Suspect hypokalemia here related. Bicarb is normal. Has been treated with antibiotics recently for UTIs. Low suspicion for infectious etiology. Will continue to monitor. - Loperamide PRN  AKI, resolved Creatinine down to baseline, 0.77, from 1.36 on admission. - Encourage PO - AM BMP  Lung carcinoma Following with  cardiothoracic surgery for biopsy which was postponed due to COVID infection.  - Currently scheduled for biopsy by CT surgery Dr. Roxan Hockey on 04/23/20. Instructed to hold warfarin 5 d prior.   Atrial fibrillation - Pharmacy following, appreciate their assistance  - INR therapeutic at 2.4  - Warfarin will be held for lung biopsy on 9/2  - Resume daily PT/INR when warfarin restarted  - Continue sq heparin while holding warfarin - metoprolol 12.5 mg daily  Hypertension - furosemide 20 mg daily (home dose: 40 mg daily) - losartan 25 mg daily - metoprolol 12.5 mg daily  Heart failure with preserved EF - furosemide 20 mg daily (home dose: 40 mg daily) - losartan 25 mg daily - metoprolol 12.5 mg daily  Diet: Heart Healthy IVF: None,None VTE: Heparin Code: Full PT/OT recs: SNF for Subacute PT, bedside commode.  Alexandria Lodge, MD [place], PGY-1 Date 04/19/2020 Time 12:35 PM Pager: [phone] Please contact the on call pager after  5 pm and on weekends at [phone].

## 2020-04-19 NOTE — Plan of Care (Signed)
  Problem: Education: Goal: Knowledge of General Education information will improve Description: Including pain rating scale, medication(s)/side effects and non-pharmacologic comfort measures Outcome: Progressing   Problem: Health Behavior/Discharge Planning: Goal: Ability to manage health-related needs will improve Outcome: Progressing   Problem: Clinical Measurements: Goal: Ability to maintain clinical measurements within normal limits will improve Outcome: Progressing Goal: Will remain free from infection Outcome: Progressing   Problem: Activity: Goal: Risk for activity intolerance will decrease Outcome: Progressing   Problem: Coping: Goal: Level of anxiety will decrease Outcome: Progressing

## 2020-04-20 ENCOUNTER — Ambulatory Visit: Payer: Medicare HMO | Admitting: Physician Assistant

## 2020-04-20 ENCOUNTER — Encounter (HOSPITAL_COMMUNITY): Payer: Self-pay | Admitting: Emergency Medicine

## 2020-04-20 DIAGNOSIS — E878 Other disorders of electrolyte and fluid balance, not elsewhere classified: Secondary | ICD-10-CM | POA: Diagnosis not present

## 2020-04-20 DIAGNOSIS — N952 Postmenopausal atrophic vaginitis: Secondary | ICD-10-CM | POA: Diagnosis not present

## 2020-04-20 DIAGNOSIS — N390 Urinary tract infection, site not specified: Secondary | ICD-10-CM

## 2020-04-20 DIAGNOSIS — R531 Weakness: Secondary | ICD-10-CM | POA: Diagnosis not present

## 2020-04-20 DIAGNOSIS — M6282 Rhabdomyolysis: Secondary | ICD-10-CM | POA: Diagnosis not present

## 2020-04-20 DIAGNOSIS — R3915 Urgency of urination: Secondary | ICD-10-CM

## 2020-04-20 DIAGNOSIS — R197 Diarrhea, unspecified: Secondary | ICD-10-CM

## 2020-04-20 DIAGNOSIS — C3411 Malignant neoplasm of upper lobe, right bronchus or lung: Secondary | ICD-10-CM | POA: Diagnosis not present

## 2020-04-20 DIAGNOSIS — G3184 Mild cognitive impairment, so stated: Secondary | ICD-10-CM

## 2020-04-20 LAB — CBC WITH DIFFERENTIAL/PLATELET
Abs Immature Granulocytes: 0.02 10*3/uL (ref 0.00–0.07)
Basophils Absolute: 0.1 10*3/uL (ref 0.0–0.1)
Basophils Relative: 1 %
Eosinophils Absolute: 0.3 10*3/uL (ref 0.0–0.5)
Eosinophils Relative: 6 %
HCT: 41.9 % (ref 36.0–46.0)
Hemoglobin: 13.6 g/dL (ref 12.0–15.0)
Immature Granulocytes: 0 %
Lymphocytes Relative: 22 %
Lymphs Abs: 1.2 10*3/uL (ref 0.7–4.0)
MCH: 30.5 pg (ref 26.0–34.0)
MCHC: 32.5 g/dL (ref 30.0–36.0)
MCV: 93.9 fL (ref 80.0–100.0)
Monocytes Absolute: 1 10*3/uL (ref 0.1–1.0)
Monocytes Relative: 19 %
Neutro Abs: 2.8 10*3/uL (ref 1.7–7.7)
Neutrophils Relative %: 52 %
Platelets: 176 10*3/uL (ref 150–400)
RBC: 4.46 MIL/uL (ref 3.87–5.11)
RDW: 14.5 % (ref 11.5–15.5)
WBC: 5.4 10*3/uL (ref 4.0–10.5)
nRBC: 0 % (ref 0.0–0.2)

## 2020-04-20 LAB — BASIC METABOLIC PANEL
Anion gap: 12 (ref 5–15)
BUN: 10 mg/dL (ref 8–23)
CO2: 23 mmol/L (ref 22–32)
Calcium: 8.7 mg/dL — ABNORMAL LOW (ref 8.9–10.3)
Chloride: 103 mmol/L (ref 98–111)
Creatinine, Ser: 0.8 mg/dL (ref 0.44–1.00)
GFR calc Af Amer: >60 mL/min (ref >60)
GFR calc non Af Amer: >60 mL/min (ref >60)
Glucose, Bld: 90 mg/dL (ref 70–99)
Potassium: 3.4 mmol/L — ABNORMAL LOW (ref 3.5–5.1)
Sodium: 138 mmol/L (ref 135–145)

## 2020-04-20 LAB — CK: Total CK: 255 U/L — ABNORMAL HIGH (ref 38–234)

## 2020-04-20 LAB — MAGNESIUM: Magnesium: 1.8 mg/dL (ref 1.7–2.4)

## 2020-04-20 MED ORDER — GLUCERNA SHAKE PO LIQD
237.0000 mL | Freq: Three times a day (TID) | ORAL | Status: DC
  Administered 2020-04-20 – 2020-04-23 (×7): 237 mL via ORAL

## 2020-04-20 MED ORDER — POTASSIUM CHLORIDE 20 MEQ PO PACK
20.0000 meq | PACK | Freq: Two times a day (BID) | ORAL | Status: DC
  Administered 2020-04-20 – 2020-04-23 (×6): 20 meq via ORAL
  Filled 2020-04-20 (×6): qty 1

## 2020-04-20 MED ORDER — ADULT MULTIVITAMIN W/MINERALS CH
1.0000 | ORAL_TABLET | Freq: Every day | ORAL | Status: DC
  Administered 2020-04-20 – 2020-04-23 (×3): 1 via ORAL
  Filled 2020-04-20 (×3): qty 1

## 2020-04-20 NOTE — NC FL2 (Signed)
[place] LEVEL OF CARE SCREENING TOOL     IDENTIFICATION  Patient Name: Leah Velasquez Birthdate: 21-Apr-1941 Sex: female Admission Date (Current Location): 04/16/2020  [facility] and [place] Number:  Herbalist and Address:  [facility] [place], [place] [address], [place], [place] [address]      Provider Number: [id number]  Attending Physician Name and Address:  Sid Falcon, MD  Relative Name and Phone Number:  Jeneen Rinks spouse [phone]    Current Level of Care: Hospital Recommended Level of Care: [place] Prior Approval Number:    Date Approved/Denied:   PASRR Number: [id number]  Discharge Plan: SNF    Current Diagnoses: Patient Active Problem List   Diagnosis Date Noted  . Non-traumatic rhabdomyolysis   . Generalized weakness 04/16/2020    Orientation RESPIRATION BLADDER Height & Weight     Self, Time, Situation, Place  O2 (Nasal cannula 1-2L) Incontinent, External catheter Weight: 215 lb (97.5 kg) Height:  5\' 1"  (154.9 cm)  BEHAVIORAL SYMPTOMS/MOOD NEUROLOGICAL BOWEL NUTRITION STATUS      Incontinent Diet (Please see DC Summary)  AMBULATORY STATUS COMMUNICATION OF NEEDS Skin   Extensive Assist Verbally Normal                       Personal Care Assistance Level of Assistance  Bathing, Feeding, Dressing Bathing Assistance: Maximum assistance Feeding assistance: Independent Dressing Assistance: Limited assistance     Functional Limitations Info             SPECIAL CARE FACTORS FREQUENCY  PT (By licensed PT), OT (By licensed OT)     PT Frequency: 5x/week OT Frequency: 5x/week            Contractures Contractures Info: Not present    Additional Factors Info  Code Status, Allergies Code Status Info: Full Allergies Info: Morphine And Related, Sulfa Antibiotics           Current Medications (04/20/2020):  This is the current hospital active medication list Current  Facility-Administered Medications  Medication Dose Route Frequency Provider Last Rate Last Admin  . acetaminophen (TYLENOL) tablet 650 mg  650 mg Oral Q6H PRN Asencion Noble, MD   650 mg at 04/19/20 2238   Or  . acetaminophen (TYLENOL) suppository 650 mg  650 mg Rectal Q6H PRN Asencion Noble, MD      . albuterol (PROVENTIL) (2.5 MG/3ML) 0.083% nebulizer solution 2.5 mg  2.5 mg Nebulization Q4H PRN Cala Bradford, RPH   2.5 mg at 04/20/20 1937  . conjugated estrogens (PREMARIN) vaginal cream 1 Applicatorful  1 Applicatorful Vaginal Derwood Kaplan, MD   1 Applicatorful at 90/24/09 1701  . darifenacin (ENABLEX) 24 hr tablet 7.5 mg  7.5 mg Oral Daily [place], Carley D, DO   7.5 mg at 04/20/20 0913  . diclofenac Sodium (VOLTAREN) 1 % topical gel 4 g  4 g Topical QID Asencion Noble, MD   4 g at 04/20/20 1432  . fluticasone (FLONASE) 50 MCG/ACT nasal spray 1 spray  1 spray Each Nare Daily Alexandria Lodge, MD   1 spray at 04/20/20 0917  . furosemide (LASIX) tablet 20 mg  20 mg Oral Daily Aslam, Sadia, MD   20 mg at 04/20/20 0914  . heparin injection 5,000 Units  5,000 Units Subcutaneous Q8H Asencion Noble, MD   5,000 Units at 04/20/20 1429  . loperamide (IMODIUM) capsule 2 mg  2 mg Oral PRN Shon Baton,  Gregary Signs, MD      . losartan (COZAAR) tablet 25 mg  25 mg Oral Daily Harvie Heck, MD   25 mg at 04/20/20 0914  . melatonin tablet 5 mg  5 mg Oral QHS PRN Alexandria Lodge, MD      . metoprolol succinate (TOPROL-XL) 24 hr tablet 12.5 mg  12.5 mg Oral Daily Aslam, Sadia, MD   12.5 mg at 04/20/20 0914  . potassium chloride (KLOR-CON) packet 20 mEq  20 mEq Oral BID Alexandria Lodge, MD   20 mEq at 04/20/20 1426  . timolol (TIMOPTIC) 0.5 % ophthalmic solution 1 drop  1 drop Both Eyes Daily Harvie Heck, MD   1 drop at 04/20/20 0917  . Warfarin - Pharmacist Dosing Inpatient   Does not apply q1600 Modena Nunnery D, DO         Discharge Medications: Please see discharge summary for a list of  discharge medications.  Relevant Imaging Results:  Relevant Lab Results:   Additional Information SSN: [place], [place]

## 2020-04-20 NOTE — Progress Notes (Signed)
Initial Nutrition Assessment  DOCUMENTATION CODES:   Morbid obesity  INTERVENTION:   -Glucerna Shake po TID, each supplement provides 220 kcal and 10 grams of protein -MVI with minerals daily  NUTRITION DIAGNOSIS:   Inadequate oral intake related to decreased appetite as evidenced by per patient/family report.  GOAL:   Patient will meet greater than or equal to 90% of their needs  MONITOR:   PO intake, Supplement acceptance, Labs, Weight trends, Skin, I & O's  REASON FOR ASSESSMENT:   Consult Assessment of nutrition requirement/status  ASSESSMENT:   This is a 79 year old female with history of chronic atrial fibrillation on Coumadin lung cancer status post right lower lobe lobe lobectomy, tobacco abuse, hypertension, hyperlipidemia, obesity, heart failure with preserved ejection fraction, rheumatoid arthritis, and recent Covid infection(ended quarantine on 04/10/2020) presenting after slipping off her toilet seat and getting stuck between her toilet and the shower for approximately 6 hours.  Pt admitted with generalized weakness.   Reviewed I/O's: +255 ml x 24 hours and +1.4 L since admission  UOP: 900 ml x 24 hours  Spoke with pt at bedside, who reports she feels very tired after working with PT. She shares that she has experienced a general decline in health over the past month or so and her husband has resumed increased caretaking responsibilities because of this. Pt reports that she consumes 3 meals per day, however, portions are much smaller due to decreased appetite and her husband's limited cooking skills. Frequently consumed items include cereal, oatmeal, fruit, TV dinners, and take out food.   Pt estimates she consumed about 50% of her breakfast (muffin and [place] yogurt), as she had great difficulty opening up the packages in her meal tray.   Pt is unsure she has lost weight and unsure of UBW.   Pt very concerned over decreased oral intake. Discussed options for  oral nutrition supplements; pt prefers a lower sugar option, she is concerned about developing DM. She would like to try Glucerna.  Labs reviewed.   NUTRITION - FOCUSED PHYSICAL EXAM:    Most Recent Value  Orbital Region No depletion  Upper Arm Region Mild depletion  Thoracic and Lumbar Region No depletion  Buccal Region No depletion  Temple Region No depletion  Clavicle Bone Region No depletion  Clavicle and Acromion Bone Region No depletion  Scapular Bone Region No depletion  Dorsal Hand No depletion  Patellar Region No depletion  Anterior Thigh Region No depletion  Posterior Calf Region No depletion  Edema (RD Assessment) Mild  Hair Reviewed  Eyes Reviewed  Mouth Reviewed  Skin Reviewed  Nails Reviewed       Diet Order:   Diet Order            Diet Heart Room service appropriate? Yes; Fluid consistency: Thin  Diet effective now                 EDUCATION NEEDS:   Education needs have been addressed  Skin:  Skin Assessment: Reviewed RN Assessment  Last BM:  04/19/20  Height:   Ht Readings from Last 1 Encounters:  04/17/20 5\' 1"  (1.549 m)    Weight:   Wt Readings from Last 1 Encounters:  04/18/20 97.5 kg    Ideal Body Weight:  47.7 kg  BMI:  Body mass index is 40.62 kg/m.  Estimated Nutritional Needs:   Kcal:  1700-1900  Protein:  95-100 grams  Fluid:  > 1.7 L    Loistine Chance, RD, LDN, CDCES Registered  Dietitian II Certified Diabetes Care and Education Specialist Please refer to [facility] for RD and/or RD on-call/weekend/after hours pager

## 2020-04-20 NOTE — Discharge Summary (Addendum)
Name: Leah Velasquez MRN: [id number] DOB: 1941/06/09 79 y.o. PCP: [name], MD  Date of Admission: 04/16/2020  6:37 AM Date of Discharge: 04/23/20 Attending Physician: Sid Falcon, MD  Discharge Diagnosis:  1. Generalized weakness 2. Atrophic vaginitis 3. Urinary urgency    Discharge Medications: Allergies as of 04/23/2020      Reactions   Morphine And Related Anaphylaxis   Vitals drop down and pass out   Sulfa Antibiotics Rash      Medication List    STOP taking these medications   traMADol 50 MG tablet Commonly known as: ULTRAM     TAKE these medications   conjugated estrogens vaginal cream Commonly known as: PREMARIN Place 1 Applicatorful vaginally every other day for 14 days.   [facility] VITAMIN B12 1000 MCG tablet Generic drug: cyanocobalamin Take 1,000 mcg by mouth daily.   furosemide 40 MG tablet Commonly known as: LASIX Take 20 mg by mouth daily.   levocetirizine 5 MG tablet Commonly known as: XYZAL Take 5 mg by mouth daily.   losartan 50 MG tablet Commonly known as: COZAAR Take 25 mg by mouth daily.   metoprolol succinate 25 MG 24 hr tablet Commonly known as: TOPROL-XL Take 12.5 mg by mouth at bedtime.   oxybutynin 5 MG 24 hr tablet Commonly known as: Ditropan XL Take 1 tablet (5 mg total) by mouth at bedtime.   simvastatin 20 MG tablet Commonly known as: ZOCOR Take 20 mg by mouth at bedtime.   timolol 0.5 % ophthalmic solution Commonly known as: TIMOPTIC Place 1 drop into both eyes at bedtime.   warfarin 5 MG tablet Commonly known as: Coumadin Take 1 tablet (5 mg total) by mouth daily for 2 days. What changed:   how much to take  when to take this  additional instructions   zolpidem 5 MG tablet Commonly known as: AMBIEN Take 5 mg by mouth at bedtime as needed for sleep.       Disposition and follow-up:   Ms.Leah Velasquez was discharged from [facility] in Stable condition.  At the hospital follow up  visit please address:  1.  Follow up on: A. Atrophic vaginitis: Follow up patient adherence to topical estrogen cream. Refill as needed. May consider monitoring for continued symptoms of UTI . May benefit from pelvic exam to evaluate to further evaluate foreatrophic vaginitis. B. Atrial Fibrillation: Pt will need to restart warfarin 5 mg tomorrow and will need to have a repeat INR on 04/24/2020 C. Lung cancer: Ensure patient is tolerating procedure well and pain controlled. Make sure she is able to follow up on results and follow up as necessary. D. Generalize deconditioning: Patient will be discharge to SNF for subacute rehab.  2.  Labs / imaging needed at time of follow-up:   - BMP - Magnesium - Protime-INR  3.  Pending labs/ test needing follow-up: Lung biopsy results  Follow-up Appointments:  Follow-up Information    [facility] Follow up.   Specialty: Home Health Services Why: [place], [place], [place] information: [place] [address] [phone]               Hospital Course by problem list:  Leah Velasquez is a 79 yo woman with history ofchronic atrial fibrillation on Coumadin,lung cancer s/p right lower lobe lobe lobectomy, tobacco abuse, hypertension, hyperlipidemia, obesity, heart failure with preserved ejection fraction, rheumatoid arthritis, B12 deficiency and recent Covid infection (ended quarantine on  04/10/2020) who presented on 04/16/20 after a fall at home with mild rhabdomyolysis.  Generalized weakness/disequilibrium Presented after sliding off commode and being stuck in between commode and shower wall. Noted to have AKI, suspected prerenal due to dehydration, which improved with trial of fluids. B12 level normal. Etiology likely multifactorial, due to recent COVID infection, lung cancer recurrence, history of heart failure, overall deconditioning, and electrolyte imbalances. She was evaluated by PT/OT, both of whom  recommended skilled nursing for subacute rehab. Initially the patient and family expressed preference for continuing home health services at home. Our team expressed our concern about the patient's safety if she were to return home given that she lives with her 66 year old husband and has required lift assist numerous times. Ultimately, she is agreeable to discharge to [facility] for subacute rehab.  Lung carcinoma Following with cardiothoracic surgery for EBUS which was postponed due to COVID infection. Warfarin held in anticipation of lung biopsy by Dr. Roxan Hockey on 04/22/20. Warfarin 5 mg restarted 04/22/2020. Needs coumadin clinic follow up on 04/24/2020.  Atrophic vaginitis On admission, patient's urinalysis remarkable for cloudy appearance, large leukocytes, and many bacteria. She was asymptomatic at the time, denied dysuria, abdominal pain, and did not have suprapubic tenderness. The patient later endorsed dysuria however still denied abdominal pain and was without suprapubic tenderness. Given that prior to this admission she had 2 recent courses of antibiotics without improvement, started estrogen cream which she reported had helped in the past, and she noted significant improvement. 8/30 urine culture was obtained as her urine appeared cloudy and result was polymicrobial, suspected likely secondary to contamination. She was continued on treatment for atrophic vaginitis.    Urinary urgency Patient reported ongoing issues with urinary urgency. She was started on darifenacin (Enablex) with good effect.   Non-traumatic rhabdomyolysis CK on admission 1,732 downtrended to 255 with fluid resuscitation.   Discharge Vitals:   BP (!) 147/88 (BP Location: Left Arm)    Pulse 76    Temp 97.8 F (36.6 C) (Oral)    Resp (!) 22    Ht 5\' 1"  (1.549 m)    Wt 104.3 kg    SpO2 94%    BMI 43.46 kg/m   Pertinent Labs, Studies, and Procedures:   04/16/20 CHEST - 2 VIEW  COMPARISON:   Multiple priors, including chest radiograph from 02/20/2020 and PET-CT from 03/10/2020  FINDINGS: Prominence of the right paratracheal stripe with outwardly convex contour, likely secondary to known paramediastinal and right upper lobe lesions, better characterized on prior PET-CT. New diffuse interstitial prominence. Low lung volumes without focal consolidation. No discernible pneumothorax. No definite pleural effusions. Mild-to-moderate enlargement of the cardiopericardial silhouette, increased from prior. Polyarticular degenerative change. Calcific atherosclerosis of the aorta. Multiple remote right rib fractures without evidence of acute osseous abnormality.  IMPRESSION: 1. Cardiomegaly with new diffuse interstitial prominence, suggestive of mild interstitial pulmonary edema. 2. Prominence of the right paratracheal stripe, likely secondary to known paramediastinal and right upper lobe lesions, better characterized on prior PET-CT.   Electronically Signed   By: Margaretha Sheffield MD   On: 04/16/2020 07:59  04/16/20 LUMBAR SPINE - COMPLETE 4+ VIEW  COMPARISON:  MRI lumbar spine 02/14/2020  FINDINGS: No definite vertebral body height loss when accounting for obliquity. Minimal anterolisthesis (grade 1) of L4 on L5 and L5 on S1, similar to prior. No new subluxation. Mild dextrocurvature. Mild-to-moderate degenerative changes, more pronounced in the lower lumbar spine and better characterized on prior MRI of the lumbar  spine. Calcific aortic atherosclerosis.  IMPRESSION: No radiographic evidence of acute fracture or traumatic malalignment.   Electronically Signed   By: Margaretha Sheffield MD   On: 04/16/2020 08:04   Discharge Instructions: Discharge Instructions    Diet - low sodium heart healthy   Complete by: As directed    Discharge instructions   Complete by: As directed    You were admitted for generalized weakness and endobronchial ultrasound. You will  be discharge to [facility] skilled nursing to help with physical rehabilitation and help with daily activities to prevent further falls. We also paused your Warfarin for your lung procedure. Please restart taking 5 mg Warfarin on 04/23/2020 and have your INR reachecked at the coumadin clinic. You were also started on Premarin to help with dysuria and enablex for urinary uregency which you should continue.  Please continue to take your other normal medications as prescribed.   Discharge instructions   Complete by: As directed    You were admitted for generalized weakness and endobronchial ultrasound. You will be discharge to [facility] skilled nursing to help with physical rehabilitation and help with daily activities to prevent further falls. We also paused your Warfarin for your lung procedure. Please restart taking 5 mg Warfarin on 04/23/2020 and have your INR reachecked at the coumadin clinic. You were also started on Premarin to help with dysuria and enablex for urinary uregency which you should continue.  Please continue to take your other normal medications as prescribed.   Increase activity slowly   Complete by: As directed    Increase activity slowly   Complete by: As directed       Signed: Iona Beard, MD 04/23/2020, 8:46 AM   Pager: [phone]

## 2020-04-20 NOTE — TOC Initial Note (Signed)
Transition of Care [facility]) - Initial/Assessment Note    Patient Details  Name: Leah Velasquez MRN: [id number] Date of Birth: 10-Mar-1941  Transition of Care [facility]) CM/SW Contact:    Leah Velasquez, [place] Phone Number: 04/20/2020, 3:46 PM  Clinical Narrative:                 CSW received consult for possible SNF placement at time of discharge. CSW spoke with patient and her step-daughter Leah Velasquez ([id number]) regarding PT recommendation of SNF placement at time of discharge. Patient reported that patient's spouse is currently unable to care for patient at their home given patient's current physical needs and fall risk. Patient expressed understanding of PT recommendation and is agreeable to SNF placement at time of discharge. Patient reports preference for [facility] . CSW discussed insurance authorization process and provided Medicare SNF ratings list. Patient expressed being hopeful for rehab and to feel better soon. Patient reported that she was recently at [facility] for about 20 days and does not wish to return. CSW adv that at this point, insurance may not cover additional days and patient may be in co-pay status.  No further questions reported at this time. CSW to continue to follow and assist with discharge planning needs.     Barriers to Discharge: Continued Medical Work up, Ship broker   Patient Goals and CMS Choice Patient states their goals for this hospitalization and ongoing recovery are:: Get better soon CMS Medicare.gov Compare Post Acute Care list provided to:: Patient Choice offered to / list presented to : Patient, Adult Children  Expected Discharge Plan and Services   In-house Referral: Clinical Social Work                                            Prior Living Arrangements/Services   Lives with:: Spouse Patient language and need for interpreter reviewed:: Yes Do you feel safe going back to the place where you live?: No   No one to take care of   Need for Family Participation in Patient Care: Yes (Comment) Care giver support system in place?: Yes (comment)   Criminal Activity/Legal Involvement Pertinent to Current Situation/Hospitalization: No - Comment as needed  Activities of Daily Living      Permission Sought/Granted Permission sought to share information with : Facility Sport and exercise psychologist, Family Supports Permission granted to share information with : Yes, Verbal Permission Granted  Share Information with NAME: Leah Velasquez  Permission granted to share info w AGENCY: SNF  Permission granted to share info w Relationship: Step-daughter  Permission granted to share info w Contact Information: [phone]  Emotional Assessment Appearance:: Appears stated age Attitude/Demeanor/Rapport: Gracious Affect (typically observed): Calm Orientation: : Oriented to Self, Oriented to Place, Oriented to  Time, Oriented to Situation Alcohol / Substance Use: Not Applicable Psych Involvement: No (comment)  Admission diagnosis:  Generalized weakness [R53.1] Chest pain [R07.9] Patient Active Problem List   Diagnosis Date Noted  . Non-traumatic rhabdomyolysis   . Generalized weakness 04/16/2020   PCP:  Leah Mires, MD Pharmacy:   [id number] [place], [place] [address] Phone: [phone] Fax: [phone]     Social Determinants of Health (SDOH) Interventions    Readmission Risk Interventions No flowsheet data found.

## 2020-04-20 NOTE — Progress Notes (Addendum)
HD#3 Subjective:   No acute events overnight.   Notes the nebulizer has helped her breathing, attributes shortness of breath to lung cancer.  Medication started yesterday has helped her urgency. Due to appearance of urine, we will obtain culture today.  She has been in touch with her family, and they are looking into possible rehab options.    Objective:  Vital signs in last 24 hours: Vitals:   04/19/20 1110 04/19/20 1956 04/20/20 0357 04/20/20 0850  BP: (!) 144/79 134/80 (!) 161/83 (!) 132/54  Pulse: 70 87 82 77  Resp: 18 18 18 18   Temp: 97.7 F (36.5 C) 98.5 F (36.9 C) 97.8 F (36.6 C) 97.7 F (36.5 C)  TempSrc: Oral Oral Oral Oral  SpO2: 97% 94% 96% 96%  Weight:      Height:        Physical Exam:   Constitutional: appears rested and comfortable this morning, in no acute distress Pulmonary/Chest: Normal work of breathing, no dyspnea with speaking, slightly diminished throughout, no wheezes or crackles MSK: trace bilateral lower extremity edema around ankles to mid shin  Pertinent Labs: CBC Latest Ref Rng & Units 04/20/2020 04/18/2020 04/17/2020  WBC 4.0 - 10.5 K/uL 5.4 5.9 6.5  Hemoglobin 12.0 - 15.0 g/dL 13.6 12.4 12.1  Hematocrit 36 - 46 % 41.9 37.8 37.7  Platelets 150 - 400 K/uL 176 182 164    CMP Latest Ref Rng & Units 04/20/2020 04/19/2020 04/18/2020  Glucose 70 - 99 mg/dL 90 88 89  BUN 8 - 23 mg/dL 10 11 10   Creatinine 0.44 - 1.00 mg/dL 0.80 0.77 0.85  Sodium 135 - 145 mmol/L 138 137 137  Potassium 3.5 - 5.1 mmol/L 3.4(L) 3.6 4.8  Chloride 98 - 111 mmol/L 103 104 106  CO2 22 - 32 mmol/L 23 22 24   Calcium 8.9 - 10.3 mg/dL 8.7(L) 8.6(L) 8.6(L)  Total Protein 6.5 - 8.1 g/dL - - -  Total Bilirubin 0.3 - 1.2 mg/dL - - -  Alkaline Phos 38 - 126 U/L - - -  AST 15 - 41 U/L - - -  ALT 0 - 44 U/L - - -    Assessment/Plan:   Active Problems:   Generalized weakness   Non-traumatic rhabdomyolysis  Patient Summary:  Suellyn Velasquez is a 79 yo woman with history  of chronic atrial fibrillation on Coumadin, lung cancer s/pt right lower lobe lobe lobectomy, tobacco abuse, hypertension, hyperlipidemia, obesity, heart failure with preserved ejection fraction, rheumatoid arthritis, and recent Covid infection (ended quarantine on 04/10/2020) who presented on 04/16/20 after slipping off her toilet seat and getting stuck between her toilet and the shower for approximately 6 hours.  This is hospital day 4.   Generalized weakness/disequilibrium Likely multifactorial, due to recent COVID infection, lung cancer recurrence, history of heart failure, overall deconditioning, electrolyte imbalances. PT/OT recommend SNF. Patient is amenable to exploring SNF options aside from [facility].  - PT/OT evaluation: both recommending SNF - TOC consulted for SNF placement - Repleting electrolytes as necessary - Encourage PO - AM BMP  Vaginal atrophy vs. UTI Improvement in reported dysuria with premarin. Denies abdominal pain, no suprapubic tenderness. S/p 2 recent courses of antibiotics without effect. Urine noted to be cloudy this morning, will culture. Urinalysis on admission with large leukocytes and many bacteria, however patient asymptomatic at that time. - Premarin every other day - Urine culture  Urinary urgency Improvement in reported symptoms with initiation of darifenacin.  - Trial of darifenacin (Enablex)  Lung carcinoma Following with cardiothoracic surgery for biopsy which was postponed due to COVID infection.  - Currently scheduled for biopsy by CT surgery Dr. Roxan Hockey on 04/23/20.  - Discussed with Dr. Leonarda Salon nurse coordinator, Levonne Spiller (direct number [phone] ) who has cancelled patient's previously scheduled 8/31 pre-admission appointment. If patient goes to SNF before then, she will need to be NPO at midnight on 9/2 and arrive at 5:30 am.  Mild Cognitive Impairment Patient's family reports recent history of mild forgetfulness. Patient has  displayed intermittent confusion during this admission.  - Delirium precautions  Diarrhea Patient reports no diarrhea since yesterday without use of loperamide. - Loperamide PRN  Non-traumatic rhabdomyolysis, resolving CK on admission 1,732. 255 today.  Atrial fibrillation - Pharmacy following, appreciate their assistance  - INR therapeutic at 2.4  - Warfarin will be held for lung biopsy on 9/2  - Resume daily PT/INR when warfarin restarted  - Continue sq heparin while holding warfarin - metoprolol 12.5 mg daily  Hypertension - furosemide 20 mg daily (home dose: 40 mg daily) - losartan 25 mg daily - metoprolol 12.5 mg daily  Heart failure with preserved EF - furosemide 20 mg daily (home dose: 40 mg daily) - losartan 25 mg daily - metoprolol 12.5 mg daily  Diet: Heart Healthy IVF: None,None VTE: Heparin Code: Full PT/OT recs: SNF for Subacute PT, bedside commode.  Alexandria Lodge, MD [place], PGY-1 Date 04/20/2020 Time 11:44 AM Pager: [phone] Please contact the on call pager after 5 pm and on weekends at [phone].

## 2020-04-20 NOTE — Progress Notes (Signed)
Physical Therapy Treatment Patient Details Name: Leah Velasquez MRN: [id number] DOB: 1940/10/24 Today's Date: 04/20/2020    History of Present Illness Pt is a 79 year old female with history of chronic atrial fibrillation, lung cancer status post right lower lobe lobe lobectomy, tobacco abuse, hypertension, hyperlipidemia, obesity, heart failure with preserved ejection fraction, rheumatoid arthritis, and recent Covid infection(ended quarantine on 04/10/2020) presenting after slipping off her toilet seat and getting stuck between her toilet and the shower for approximately 6 hours. Lumbar xray negative for acute fracture.     PT Comments    Pt with reports of being tired today. With encouragement agreed to exercises only. Acute PT to continue during pt's hospital stay.    Follow Up Recommendations  SNF     Equipment Recommendations  None recommended by PT    Precautions / Restrictions Precautions Precautions: Fall Precaution Comments: incontinent, knee pain Restrictions Weight Bearing Restrictions: No    Cognition Arousal/Alertness: Awake/alert Behavior During Therapy: Flat affect Overall Cognitive Status: No family/caregiver present to determine baseline cognitive functioning                  General Comments: repetitive instructions and slow to follow commands; tangential comments at times      Exercises General Exercises - Lower Extremity Ankle Circles/Pumps: AROM;Strengthening;Both;10 reps;Supine;Limitations Ankle Circles/Pumps Limitations: cues to move in the full range of motion Quad Sets: AROM;Strengthening;Both;10 reps;Supine;Limitations Quad Sets Limitations: cues for 5 sec holds Heel Slides: AAROM;Strengthening;Left;10 reps;Supine;Limitations Heel Slides Limitations: min AA with limited range on bil sides due to knee pain Hip ABduction/ADduction: AAROM;Strengthening;Both;10 reps;Supine;Limitations Hip Abduction/Adduction Limitations: min AA needed Straight Leg  Raises: AAROM;Strengthening;Both;10 reps;Supine;Limitations Straight Leg Raises Limitations: min AA to prevent extension lag     Pertinent Vitals/Pain Pain Assessment: Faces Faces Pain Scale: Hurts even more Pain Location: bil knees Pain Descriptors / Indicators: Grimacing;Guarding Pain Intervention(s): Limited activity within patient's tolerance;Monitored during session;Premedicated before session;Repositioned     PT Goals (current goals can now be found in the care plan section) Acute Rehab PT Goals Patient Stated Goal: less pain PT Goal Formulation: With patient Time For Goal Achievement: 05/01/20 Potential to Achieve Goals: Fair Progress towards PT goals: Progressing toward goals    Frequency    Min 3X/week      PT Plan Current plan remains appropriate    AM-PAC PT "6 Clicks" Mobility   Outcome Measure  Help needed turning from your back to your side while in a flat bed without using bedrails?: A Lot Help needed moving from lying on your back to sitting on the side of a flat bed without using bedrails?: A Lot Help needed moving to and from a bed to a chair (including a wheelchair)?: Total Help needed standing up from a chair using your arms (e.g., wheelchair or bedside chair)?: Total Help needed to walk in hospital room?: Total Help needed climbing 3-5 steps with a railing? : Total 6 Click Score: 8    End of Session   Activity Tolerance: Patient tolerated treatment well;Patient limited by fatigue Patient left: in bed;with call bell/phone within reach;with bed alarm set Nurse Communication: Mobility status PT Visit Diagnosis: Muscle weakness (generalized) (M62.81);Pain Pain - Right/Left:  (bil LE's) Pain - part of body: Knee     Time: 1062-6948 PT Time Calculation (min) (ACUTE ONLY): 13 min  Charges:  $Therapeutic Exercise: 8-22 mins                    Willow Ora, PTA, CLT Acute Rehab  Services Office- [phone] 04/20/20, 11:03 AM   Willow Ora 04/20/2020, 11:02 AM

## 2020-04-21 ENCOUNTER — Inpatient Hospital Stay (HOSPITAL_COMMUNITY): Admission: RE | Admit: 2020-04-21 | Payer: Medicare HMO | Source: Ambulatory Visit

## 2020-04-21 DIAGNOSIS — R531 Weakness: Secondary | ICD-10-CM | POA: Diagnosis not present

## 2020-04-21 DIAGNOSIS — E878 Other disorders of electrolyte and fluid balance, not elsewhere classified: Secondary | ICD-10-CM | POA: Diagnosis not present

## 2020-04-21 DIAGNOSIS — N952 Postmenopausal atrophic vaginitis: Secondary | ICD-10-CM | POA: Diagnosis not present

## 2020-04-21 DIAGNOSIS — C3411 Malignant neoplasm of upper lobe, right bronchus or lung: Secondary | ICD-10-CM | POA: Diagnosis not present

## 2020-04-21 DIAGNOSIS — M6282 Rhabdomyolysis: Secondary | ICD-10-CM | POA: Diagnosis not present

## 2020-04-21 LAB — BASIC METABOLIC PANEL
Anion gap: 11 (ref 5–15)
BUN: 12 mg/dL (ref 8–23)
CO2: 25 mmol/L (ref 22–32)
Calcium: 9.3 mg/dL (ref 8.9–10.3)
Chloride: 101 mmol/L (ref 98–111)
Creatinine, Ser: 0.86 mg/dL (ref 0.44–1.00)
GFR calc Af Amer: >60 mL/min (ref >60)
GFR calc non Af Amer: >60 mL/min (ref >60)
Glucose, Bld: 103 mg/dL — ABNORMAL HIGH (ref 70–99)
Potassium: 3.7 mmol/L (ref 3.5–5.1)
Sodium: 137 mmol/L (ref 135–145)

## 2020-04-21 LAB — PROTIME-INR
INR: 1.6 — ABNORMAL HIGH (ref 0.8–1.2)
Prothrombin Time: 18.7 seconds — ABNORMAL HIGH (ref 11.4–15.2)

## 2020-04-21 LAB — URINE CULTURE

## 2020-04-21 LAB — SARS CORONAVIRUS 2 BY RT PCR (HOSPITAL ORDER, PERFORMED IN ~~LOC~~ HOSPITAL LAB): SARS Coronavirus 2: NEGATIVE

## 2020-04-21 NOTE — Progress Notes (Signed)
CSW spoke with daughter of pt, adv that [facility] is still pending but [facility] and [facility] have bed offers. Provided Medicare.gov info for ratings list. Daughter stated they would make a decision in the morning about placement. CSW spoke with Dr, pt lung biopsy is rescheduled for 9/1 instead of 9/2, adv daughter.

## 2020-04-21 NOTE — Progress Notes (Signed)
HD#4 Subjective:   No acute events overnight.   Received message from SW today that patient's husband declined SNF and instead would like the patient to discharge home with continued home health services. Discussed with patient during rounds this morning. She states she is doing "ok" this morning, feeling fatigued secondary to a poor night's sleep. Reports her dysuria is improving. She had one loose stool yesterday however none since. Regarding disposition, we expressed to the patient our concern that given her weakness on our and PT/OT evaluation, we are concerned about her safety going home. She expressed that "it is complicated," citing that rehab services are expensive. However, she acknowledges that her husband is unable to lift or help her transfer at home. Shortly after rounds called the patient's step daughter and husband on speaker phone with the patient. The husband expressed that he was dissatisfied with previous SNFs the patient has been in, one in [facility] and [place]. After expressing our concern regarding the patient's discharge home, he was amenable to SNF if the patient could go to [facility].   Objective:  Vital signs in last 24 hours: Vitals:   04/20/20 1332 04/20/20 2009 04/21/20 0412 04/21/20 1233  BP: 140/73 (!) 154/82 (!) 145/87 (!) 143/75  Pulse: 74 81 75 82  Resp: 18 19 18 18   Temp: 98.2 F (36.8 C) 97.9 F (36.6 C) 97.9 F (36.6 C) (!) 97.5 F (36.4 C)  TempSrc: Oral Oral Oral Oral  SpO2: 95% 95% 92% 96%  Weight:      Height:        Physical Exam:   Constitutional: appears comfortable this morning, in no acute distress Pulmonary/Chest: Normal work of breathing, no dyspnea with speaking MSK: trace bilateral lower extremity edema around ankles to mid shin  Pertinent Labs: CBC Latest Ref Rng & Units 04/20/2020 04/18/2020 04/17/2020  WBC 4.0 - 10.5 K/uL 5.4 5.9 6.5  Hemoglobin 12.0 - 15.0 g/dL 13.6 12.4 12.1  Hematocrit 36 - 46 % 41.9 37.8 37.7  Platelets  150 - 400 K/uL 176 182 164    CMP Latest Ref Rng & Units 04/21/2020 04/20/2020 04/19/2020  Glucose 70 - 99 mg/dL 103(H) 90 88  BUN 8 - 23 mg/dL 12 10 11   Creatinine 0.44 - 1.00 mg/dL 0.86 0.80 0.77  Sodium 135 - 145 mmol/L 137 138 137  Potassium 3.5 - 5.1 mmol/L 3.7 3.4(L) 3.6  Chloride 98 - 111 mmol/L 101 103 104  CO2 22 - 32 mmol/L 25 23 22   Calcium 8.9 - 10.3 mg/dL 9.3 8.7(L) 8.6(L)  Total Protein 6.5 - 8.1 g/dL - - -  Total Bilirubin 0.3 - 1.2 mg/dL - - -  Alkaline Phos 38 - 126 U/L - - -  AST 15 - 41 U/L - - -  ALT 0 - 44 U/L - - -    Assessment/Plan:   Active Problems:   Generalized weakness   Non-traumatic rhabdomyolysis  Patient Summary:  Leah Velasquez is a 79 yo woman with history of chronic atrial fibrillation on Coumadin, lung cancer s/pt right lower lobe lobe lobectomy, tobacco abuse, hypertension, hyperlipidemia, obesity, heart failure with preserved ejection fraction, rheumatoid arthritis, and recent Covid infection (ended quarantine on 04/10/2020) who presented on 04/16/20 after slipping off her toilet seat and getting stuck between her toilet and the shower for approximately 6 hours.  This is hospital day 5. The patient is medically cleared for discharge to SNF pending lung biopsy tomorrow.  Generalized weakness/disequilibrium Likely multifactorial, due  to recent COVID infection, lung cancer recurrence, history of heart failure, overall deconditioning, electrolyte imbalances. PT/OT recommend SNF. We strongly recommend SNF. The patient acknowledges that she would benefit from SNF rehab. The patient is unsure whether she would like to go to SNF due to home obligations. There are concerns there may be financial barriers to SNF placement.  - PT/OT evaluation: both recommending SNF - TOC working on SNF placement  Atrophic vaginitis vs. UTI Improvement in reported dysuria with premarin. Denies abdominal pain, no suprapubic tenderness. S/p 2 recent courses of antibiotics  without effect. Urine noted to be cloudy this morning, will culture. Urinalysis on admission with large leukocytes and many bacteria, however patient asymptomatic at that time. - Premarin every other day - 8/30 urine culture was polymicrobial, likely secondary to contamination. Will continue to treat for atrophic vaginitis at this time and reassess for further work-up for UTI.  Urinary urgency Improvement in reported symptoms with initiation of darifenacin.  - darifenacin (Enablex)   Lung carcinoma Following with cardiothoracic surgery for biopsy which was postponed due to COVID infection.  - Patient scheduled for biopsy by CT surgery with with Dr. Roxan Hockey tomorrow, 04/22/20 - NPO at midnight  - Will resume warfarin per CT surgery after biopsy  Atrial fibrillation - Pharmacy following, appreciate their assistance  - INR therapeutic at 2.4  - Warfarin will be held for lung biopsy on 9/1  - Resume daily PT/INR when warfarin restarted  - Continue sq heparin while holding warfarin  - Resume warfarin per CT surgery after biopsy - Patient will need coumadin clinic follow-up after discharge - metoprolol 12.5 mg daily  Hypertension - furosemide 20 mg daily (home dose: 40 mg daily) - losartan 25 mg daily - metoprolol 12.5 mg daily  Heart failure with preserved EF - furosemide 20 mg daily (home dose: 40 mg daily) - losartan 25 mg daily - metoprolol 12.5 mg daily  Mild Cognitive Impairment Patient's family reports recent history of mild forgetfulness. Patient has displayed intermittent confusion during this admission.  - Delirium precautions  Diet: Heart Healthy IVF: None,None VTE: Heparin Code: Full PT/OT recs: SNF for Subacute PT, bedside commode.  Alexandria Lodge, MD [place], PGY-1 Date 04/21/2020 Time 4:29 PM Pager: [phone] Please contact the on call pager after 5 pm and on weekends at [phone].

## 2020-04-21 NOTE — Progress Notes (Signed)
Patient unable to stand for weight this morning and scale on bed broken.Will report off to oncoming shift to try to get a weight.

## 2020-04-21 NOTE — TOC Transition Note (Signed)
Transition of Care [facility]) - CM/SW Discharge Note   Patient Details  Name: JACKEE GLASNER MRN: [id number] Date of Birth: Jul 20, 1941  Transition of Care [facility]) CM/SW Contact:  Zenon Mayo, RN Phone Number: 04/21/2020, 10:06 AM   Clinical Narrative:    NCM was informed by CSW that patient would like to go home with [facility] services, she is active with [facility] and would like to continue with them.  [place] notified.  She has HHRN, [place] , and HHAIDE .  She will also need ambulance transport at dc.    Final next level of care: [place] Barriers to Discharge: Continued Medical Work up   Patient Goals and CMS Choice Patient states their goals for this hospitalization and ongoing recovery are:: Get better soon CMS Medicare.gov Compare Post Acute Care list provided to:: Patient Choice offered to / list presented to : Patient, Adult Children  Discharge Placement                       Discharge Plan and Services In-house Referral: Clinical Social Work                DME Agency: NA       HH Arranged: Therapist, sports, PT, Nurse's Aide [place] Agency: [place] Date [facility] Agency Contacted: 04/21/20 Time [place]: [phone] Representative spoke with at [place]: [place]) Interventions     Readmission Risk Interventions No flowsheet data found.

## 2020-04-21 NOTE — Progress Notes (Signed)
Occupational Therapy Treatment Patient Details Name: Leah Velasquez MRN: [id number] DOB: 1941/04/26 Today's Date: 04/21/2020    History of present illness Pt is a 79 year old female with history of chronic atrial fibrillation, lung cancer status post right lower lobe lobe lobectomy, tobacco abuse, hypertension, hyperlipidemia, obesity, heart failure with preserved ejection fraction, rheumatoid arthritis, and recent Covid infection(ended quarantine on 04/10/2020) presenting after slipping off her toilet seat and getting stuck between her toilet and the shower for approximately 6 hours. Lumbar xray negative for acute fracture.    OT comments  Pt received in bed with NT at bed side, awake and alert, declines out of bed activity due to fatigue even with  education and max encouragement. Bed level there ex with yellow theraband to improve UE strength for bed mobility and ADL engagement. Pt is very self limiting and will benefit from continued acute OT to progress to EOB activity to increase strength and activity tolerance. DC and Freq remains the same.     Follow Up Recommendations  SNF;Supervision/Assistance - 24 hour    Equipment Recommendations  3 in 1 bedside commode;Other (comment) (TBD)    Recommendations for Other Services      Precautions / Restrictions Precautions Precautions: Fall Precaution Comments: incontinent, knee pain       Mobility Bed Mobility Overal bed mobility: Needs Assistance Bed Mobility: Supine to Sit;Sit to Supine;Rolling           General bed mobility comments: declines bed mobility, required assist to shift pelvis in bed level.  Transfers                 General transfer comment: declined    Balance Overall balance assessment: History of Falls;Needs assistance Sitting-balance support: Feet supported;Bilateral upper extremity supported Sitting balance-Leahy Scale: Fair                                     ADL either performed or  assessed with clinical judgement   ADL Overall ADL's : Needs assistance/impaired                                       General ADL Comments: Pt declined OOB or EOB ther ex even with max encouragement for sitting tolerance and strength. Pt agreeable to bed level ther ex with theraband.      Vision       Perception     Praxis      Cognition Arousal/Alertness: Awake/alert Behavior During Therapy: Flat affect Overall Cognitive Status: No family/caregiver present to determine baseline cognitive functioning                                 General Comments: repetitive instructions and slow to follow commands; tangential comments at times        Exercises Exercises: General Upper Extremity (cues to fully extend joints for exercise for full benefit) General Exercises - Upper Extremity Shoulder Flexion: AROM;Strengthening;Both;20 reps;Supine;Theraband (reclined sitting, with rest breaks in between exercises.) Theraband Level (Shoulder Flexion): Level 1 (Yellow) Shoulder Extension: AROM;Strengthening;Both;20 reps;Supine;Theraband Theraband Level (Shoulder Extension): Level 1 (Yellow) Shoulder Horizontal ABduction: AROM;Strengthening;Both;20 reps;Supine;Theraband Theraband Level (Shoulder Horizontal Abduction): Level 1 (Yellow) Shoulder Horizontal ADduction: AROM;Strengthening;Both;20 reps;Supine;Theraband Theraband Level (Shoulder Horizontal Adduction): Level 1 (Yellow) Elbow Flexion: AROM;Strengthening;Both;20 reps;Supine;Theraband Theraband  Level (Elbow Flexion): Level 1 (Yellow) Elbow Extension: AROM;Strengthening;Both;20 reps;Supine;Theraband Theraband Level (Elbow Extension): Level 1 (Yellow)   Shoulder Instructions       General Comments Pt continously complains of pain and declines sitting up EOB, educated of importance to sit up while in hospital and to engage in more movement.    Pertinent Vitals/ Pain       Pain Assessment: Faces Faces  Pain Scale: Hurts even more Pain Location: bil knees Pain Descriptors / Indicators: Grimacing;Guarding Pain Intervention(s): Limited activity within patient's tolerance;Monitored during session  Home Living                                          Prior Functioning/Environment              Frequency  Min 2X/week        Progress Toward Goals  OT Goals(current goals can now be found in the care plan section)  Progress towards OT goals: Not progressing toward goals - comment (due to self limitations and decreased OOB movement.)  Acute Rehab OT Goals Patient Stated Goal: less pain OT Goal Formulation: With patient Time For Goal Achievement: 05/01/20 Potential to Achieve Goals: Fair ADL Goals Pt Will Perform Grooming: with supervision;sitting Pt Will Perform Upper Body Bathing: with supervision;sitting Pt Will Perform Lower Body Bathing: with mod assist;sitting/lateral leans Pt Will Perform Upper Body Dressing: with supervision;sitting Pt/caregiver will Perform Home Exercise Program: Increased strength;Both right and left upper extremity;With written HEP provided;With Supervision Additional ADL Goal #1: Pt will perform bed mobility with minA as precursor to EOB/OOB ADL. Additional ADL Goal #2: Pt will participate in EOB/OOB mobility assessment to further progress independence with ADL.  Plan Discharge plan remains appropriate    Co-evaluation                 AM-PAC OT "6 Clicks" Daily Activity     Outcome Measure   Help from another person eating meals?: A Little Help from another person taking care of personal grooming?: A Little Help from another person toileting, which includes using toliet, bedpan, or urinal?: Total Help from another person bathing (including washing, rinsing, drying)?: A Lot Help from another person to put on and taking off regular upper body clothing?: A Lot Help from another person to put on and taking off regular lower  body clothing?: Total 6 Click Score: 12    End of Session    OT Visit Diagnosis: Other abnormalities of gait and mobility (R26.89);History of falling (Z91.81);Muscle weakness (generalized) (M62.81)   Activity Tolerance Other (comment) (pt is still self limiting )   Patient Left in bed;with call bell/phone within reach   Nurse Communication Mobility status;Need for lift equipment (pt needs new sacral pad)        Time: 5726-2035 OT Time Calculation (min): 33 min  Charges: OT General Charges $OT Visit: 1 Visit OT Treatments $Therapeutic Exercise: 23-37 mins  Minus Breeding, MSOT, OTR/L  Supplemental Rehabilitation Services  [phone]    Marius Ditch 04/21/2020, 3:24 PM

## 2020-04-22 ENCOUNTER — Inpatient Hospital Stay (HOSPITAL_COMMUNITY)
Admission: RE | Admit: 2020-04-22 | Payer: Medicare HMO | Source: Home / Self Care | Admitting: Thoracic Surgery (Cardiothoracic Vascular Surgery)

## 2020-04-22 ENCOUNTER — Inpatient Hospital Stay (HOSPITAL_COMMUNITY): Payer: Medicare Other

## 2020-04-22 ENCOUNTER — Other Ambulatory Visit: Payer: Self-pay

## 2020-04-22 ENCOUNTER — Encounter (HOSPITAL_COMMUNITY): Payer: Self-pay | Admitting: Internal Medicine

## 2020-04-22 ENCOUNTER — Encounter (HOSPITAL_COMMUNITY)
Admission: EM | Disposition: A | Discharge status: Skilled Nursing Facility | Payer: Self-pay | Source: Home / Self Care | Attending: Internal Medicine

## 2020-04-22 ENCOUNTER — Ambulatory Visit: Payer: Medicare HMO | Admitting: Cardiovascular Disease

## 2020-04-22 ENCOUNTER — Encounter (HOSPITAL_COMMUNITY): Admission: RE | Payer: Self-pay | Source: Home / Self Care

## 2020-04-22 DIAGNOSIS — E878 Other disorders of electrolyte and fluid balance, not elsewhere classified: Secondary | ICD-10-CM | POA: Diagnosis not present

## 2020-04-22 DIAGNOSIS — R680 Hypothermia, not associated with low environmental temperature: Secondary | ICD-10-CM | POA: Diagnosis not present

## 2020-04-22 DIAGNOSIS — N179 Acute kidney failure, unspecified: Secondary | ICD-10-CM | POA: Diagnosis not present

## 2020-04-22 DIAGNOSIS — Z6841 Body Mass Index (BMI) 40.0 and over, adult: Secondary | ICD-10-CM | POA: Diagnosis not present

## 2020-04-22 DIAGNOSIS — M069 Rheumatoid arthritis, unspecified: Secondary | ICD-10-CM | POA: Diagnosis not present

## 2020-04-22 DIAGNOSIS — I2510 Atherosclerotic heart disease of native coronary artery without angina pectoris: Secondary | ICD-10-CM | POA: Diagnosis not present

## 2020-04-22 DIAGNOSIS — R3915 Urgency of urination: Secondary | ICD-10-CM | POA: Diagnosis not present

## 2020-04-22 DIAGNOSIS — Z20822 Contact with and (suspected) exposure to covid-19: Secondary | ICD-10-CM | POA: Diagnosis not present

## 2020-04-22 DIAGNOSIS — G3184 Mild cognitive impairment, so stated: Secondary | ICD-10-CM | POA: Diagnosis not present

## 2020-04-22 DIAGNOSIS — E876 Hypokalemia: Secondary | ICD-10-CM | POA: Diagnosis not present

## 2020-04-22 DIAGNOSIS — E860 Dehydration: Secondary | ICD-10-CM | POA: Diagnosis not present

## 2020-04-22 DIAGNOSIS — Z85118 Personal history of other malignant neoplasm of bronchus and lung: Secondary | ICD-10-CM | POA: Diagnosis not present

## 2020-04-22 DIAGNOSIS — R531 Weakness: Secondary | ICD-10-CM | POA: Diagnosis present

## 2020-04-22 DIAGNOSIS — C3411 Malignant neoplasm of upper lobe, right bronchus or lung: Secondary | ICD-10-CM | POA: Diagnosis not present

## 2020-04-22 DIAGNOSIS — E785 Hyperlipidemia, unspecified: Secondary | ICD-10-CM | POA: Diagnosis not present

## 2020-04-22 DIAGNOSIS — I4819 Other persistent atrial fibrillation: Secondary | ICD-10-CM | POA: Diagnosis not present

## 2020-04-22 DIAGNOSIS — I5032 Chronic diastolic (congestive) heart failure: Secondary | ICD-10-CM | POA: Diagnosis not present

## 2020-04-22 DIAGNOSIS — I110 Hypertensive heart disease with heart failure: Secondary | ICD-10-CM | POA: Diagnosis not present

## 2020-04-22 DIAGNOSIS — R590 Localized enlarged lymph nodes: Secondary | ICD-10-CM

## 2020-04-22 DIAGNOSIS — N952 Postmenopausal atrophic vaginitis: Secondary | ICD-10-CM | POA: Diagnosis not present

## 2020-04-22 DIAGNOSIS — E538 Deficiency of other specified B group vitamins: Secondary | ICD-10-CM | POA: Diagnosis not present

## 2020-04-22 DIAGNOSIS — K529 Noninfective gastroenteritis and colitis, unspecified: Secondary | ICD-10-CM | POA: Diagnosis not present

## 2020-04-22 DIAGNOSIS — R42 Dizziness and giddiness: Secondary | ICD-10-CM | POA: Diagnosis not present

## 2020-04-22 DIAGNOSIS — M6282 Rhabdomyolysis: Secondary | ICD-10-CM | POA: Diagnosis not present

## 2020-04-22 DIAGNOSIS — R222 Localized swelling, mass and lump, trunk: Secondary | ICD-10-CM

## 2020-04-22 HISTORY — PX: VIDEO BRONCHOSCOPY WITH ENDOBRONCHIAL ULTRASOUND: SHX6177

## 2020-04-22 LAB — SURGICAL PCR SCREEN
MRSA, PCR: NEGATIVE
Staphylococcus aureus: POSITIVE — AB

## 2020-04-22 LAB — CBC
HCT: 43.3 % (ref 36.0–46.0)
Hemoglobin: 14 g/dL (ref 12.0–15.0)
MCH: 30.7 pg (ref 26.0–34.0)
MCHC: 32.3 g/dL (ref 30.0–36.0)
MCV: 95 fL (ref 80.0–100.0)
Platelets: 187 10*3/uL (ref 150–400)
RBC: 4.56 MIL/uL (ref 3.87–5.11)
RDW: 14.6 % (ref 11.5–15.5)
WBC: 8.7 10*3/uL (ref 4.0–10.5)
nRBC: 0 % (ref 0.0–0.2)

## 2020-04-22 LAB — BRAIN NATRIURETIC PEPTIDE: B Natriuretic Peptide: 216.6 pg/mL — ABNORMAL HIGH (ref 0.0–100.0)

## 2020-04-22 SURGERY — VIDEO BRONCHOSCOPY WITH ENDOBRONCHIAL NAVIGATION
Anesthesia: General

## 2020-04-22 SURGERY — BRONCHOSCOPY, WITH EBUS
Anesthesia: General

## 2020-04-22 MED ORDER — SUGAMMADEX SODIUM 200 MG/2ML IV SOLN
INTRAVENOUS | Status: DC | PRN
  Administered 2020-04-22: 400 mg via INTRAVENOUS

## 2020-04-22 MED ORDER — ROCURONIUM BROMIDE 10 MG/ML (PF) SYRINGE
PREFILLED_SYRINGE | INTRAVENOUS | Status: DC | PRN
  Administered 2020-04-22: 50 mg via INTRAVENOUS
  Administered 2020-04-22: 30 mg via INTRAVENOUS

## 2020-04-22 MED ORDER — PROPOFOL 10 MG/ML IV BOLUS
INTRAVENOUS | Status: DC | PRN
  Administered 2020-04-22: 160 mg via INTRAVENOUS

## 2020-04-22 MED ORDER — OXYBUTYNIN CHLORIDE ER 5 MG PO TB24: 5.0000 mg | ORAL_TABLET | Freq: Every day | ORAL | 0 refills | Status: DC

## 2020-04-22 MED ORDER — LIDOCAINE 2% (20 MG/ML) 5 ML SYRINGE
INTRAMUSCULAR | Status: DC | PRN
  Administered 2020-04-22: 100 mg via INTRAVENOUS

## 2020-04-22 MED ORDER — PROPOFOL 10 MG/ML IV BOLUS
INTRAVENOUS | Status: AC
  Filled 2020-04-22: qty 20

## 2020-04-22 MED ORDER — WARFARIN SODIUM 5 MG PO TABS: 5.0000 mg | ORAL_TABLET | Freq: Every day | ORAL | 0 refills | Status: DC

## 2020-04-22 MED ORDER — CHLORHEXIDINE GLUCONATE CLOTH 2 % EX PADS
6.0000 | MEDICATED_PAD | Freq: Every day | CUTANEOUS | Status: DC
  Administered 2020-04-23: 6 via TOPICAL

## 2020-04-22 MED ORDER — WARFARIN SODIUM 5 MG PO TABS
5.0000 mg | ORAL_TABLET | Freq: Every day | ORAL | Status: AC
  Administered 2020-04-22: 5 mg via ORAL
  Filled 2020-04-22: qty 1

## 2020-04-22 MED ORDER — PHENYLEPHRINE 40 MCG/ML (10ML) SYRINGE FOR IV PUSH (FOR BLOOD PRESSURE SUPPORT)
PREFILLED_SYRINGE | INTRAVENOUS | Status: DC | PRN
  Administered 2020-04-22 (×3): 80 ug via INTRAVENOUS

## 2020-04-22 MED ORDER — MUPIROCIN 2 % EX OINT
1.0000 "application " | TOPICAL_OINTMENT | Freq: Two times a day (BID) | CUTANEOUS | Status: DC
  Administered 2020-04-22 – 2020-04-23 (×3): 1 via NASAL
  Filled 2020-04-22: qty 22

## 2020-04-22 MED ORDER — EPINEPHRINE PF 1 MG/ML IJ SOLN
INTRAMUSCULAR | Status: DC | PRN
  Administered 2020-04-22: 1 mg via ENDOTRACHEOPULMONARY

## 2020-04-22 MED ORDER — PHENOL 1.4 % MT LIQD
1.0000 | OROMUCOSAL | Status: DC | PRN
  Administered 2020-04-23: 1 via OROMUCOSAL
  Filled 2020-04-22: qty 177

## 2020-04-22 MED ORDER — FENTANYL CITRATE (PF) 250 MCG/5ML IJ SOLN
INTRAMUSCULAR | Status: DC | PRN
Start: 2020-04-22 — End: 2020-04-22
  Administered 2020-04-22: 100 ug via INTRAVENOUS

## 2020-04-22 MED ORDER — FENTANYL CITRATE (PF) 250 MCG/5ML IJ SOLN
INTRAMUSCULAR | Status: AC
  Filled 2020-04-22: qty 5

## 2020-04-22 MED ORDER — LACTATED RINGERS IV SOLN: INTRAVENOUS | Status: DC | PRN

## 2020-04-22 MED ORDER — FENTANYL CITRATE (PF) 100 MCG/2ML IJ SOLN: 25.0000 ug | INTRAMUSCULAR | Status: DC | PRN

## 2020-04-22 MED ORDER — ALBUTEROL SULFATE (2.5 MG/3ML) 0.083% IN NEBU
2.5000 mg | INHALATION_SOLUTION | Freq: Once | RESPIRATORY_TRACT | Status: AC
  Administered 2020-04-22: 2.5 mg via RESPIRATORY_TRACT

## 2020-04-22 MED ORDER — 0.9 % SODIUM CHLORIDE (POUR BTL) OPTIME
TOPICAL | Status: DC | PRN
  Administered 2020-04-22: 1000 mL

## 2020-04-22 MED ORDER — ESTROGENS, CONJUGATED 0.625 MG/GM VA CREA: 1.0000 | TOPICAL_CREAM | VAGINAL | 0 refills | Status: AC

## 2020-04-22 MED ORDER — DEXAMETHASONE SODIUM PHOSPHATE 10 MG/ML IJ SOLN
INTRAMUSCULAR | Status: DC | PRN
  Administered 2020-04-22: 5 mg via INTRAVENOUS

## 2020-04-22 MED ORDER — CHLORHEXIDINE GLUCONATE 0.12 % MT SOLN
OROMUCOSAL | Status: AC
  Filled 2020-04-22: qty 15

## 2020-04-22 MED ORDER — ONDANSETRON HCL 4 MG/2ML IJ SOLN
INTRAMUSCULAR | Status: DC | PRN
  Administered 2020-04-22: 4 mg via INTRAVENOUS

## 2020-04-22 MED ORDER — ALBUTEROL SULFATE (2.5 MG/3ML) 0.083% IN NEBU
INHALATION_SOLUTION | RESPIRATORY_TRACT | Status: AC
  Filled 2020-04-22: qty 3

## 2020-04-22 MED ORDER — PHENYLEPHRINE HCL-NACL 10-0.9 MG/250ML-% IV SOLN
INTRAVENOUS | Status: DC | PRN
  Administered 2020-04-22: 75 ug/min via INTRAVENOUS

## 2020-04-22 MED ORDER — EPINEPHRINE PF 1 MG/ML IJ SOLN
INTRAMUSCULAR | Status: AC
  Filled 2020-04-22: qty 1

## 2020-04-22 MED ORDER — ONDANSETRON HCL 4 MG/2ML IJ SOLN: 4.0000 mg | Freq: Once | INTRAMUSCULAR | Status: DC | PRN

## 2020-04-22 SURGICAL SUPPLY — 58 items
ADAPTER BRONCHOSCOPE OLYMP 190 (ADAPTER) ×1 IMPLANT
ADAPTER BRONCHOSCOPE OLYMPUS (ADAPTER) ×2 IMPLANT
ADAPTER VALVE BIOPSY EBUS (MISCELLANEOUS) IMPLANT
ADPR BSCP OLMPS EDG (ADAPTER) ×1
ADPR BSCP STRL LF REUSE (ADAPTER) ×1
ADPTR VALVE BIOPSY EBUS (MISCELLANEOUS)
BLADE CLIPPER SURG (BLADE) ×2 IMPLANT
BRUSH BIOPSY BRONCH 10 SDTNB (MISCELLANEOUS) IMPLANT
BRUSH CYTOL CELLEBRITY 1.5X140 (MISCELLANEOUS) IMPLANT
BRUSH CYTOLOGY (MISCELLANEOUS) ×1 IMPLANT
BRUSH SUPERTRAX BIOPSY (INSTRUMENTS) IMPLANT
BRUSH SUPERTRAX NDL-TIP CYTO (INSTRUMENTS) ×2 IMPLANT
CANISTER SUCT 3000ML PPV (MISCELLANEOUS) ×3 IMPLANT
CNTNR URN SCR LID CUP LEK RST (MISCELLANEOUS) ×3 IMPLANT
CONT SPEC 4OZ STRL OR WHT (MISCELLANEOUS) ×4
COVER BACK TABLE 60X90IN (DRAPES) ×3 IMPLANT
FILTER STRAW FLUID ASPIR (MISCELLANEOUS) IMPLANT
FORCEPS BIOP RJ4 1.8 (CUTTING FORCEPS) IMPLANT
FORCEPS BIOP SUPERTRX PREMAR (INSTRUMENTS) ×1 IMPLANT
FORCEPS RADIAL JAW LRG 4 PULM (INSTRUMENTS) IMPLANT
GAUZE SPONGE 4X4 12PLY STRL (GAUZE/BANDAGES/DRESSINGS) ×2 IMPLANT
GLOVE BIO SURGEON STRL SZ 6.5 (GLOVE) ×1 IMPLANT
GLOVE SURG SIGNA 7.5 PF LTX (GLOVE) ×4 IMPLANT
GOWN STRL REUS W/ TWL XL LVL3 (GOWN DISPOSABLE) ×2 IMPLANT
GOWN STRL REUS W/TWL XL LVL3 (GOWN DISPOSABLE) ×4
KIT CLEAN ENDO COMPLIANCE (KITS) ×5 IMPLANT
KIT ILLUMISITE 180 PROCEDURE (KITS) IMPLANT
KIT ILLUMISITE 90 PROCEDURE (KITS) IMPLANT
KIT TURNOVER KIT B (KITS) ×3 IMPLANT
MARKER SKIN DUAL TIP RULER LAB (MISCELLANEOUS) ×3 IMPLANT
NDL ASPIRATION VIZISHOT 19G (NEEDLE) IMPLANT
NDL ASPIRATION VIZISHOT 21G (NEEDLE) ×1 IMPLANT
NDL BLUNT 18X1 FOR OR ONLY (NEEDLE) IMPLANT
NDL SUPERTRX PREMARK BIOPSY (NEEDLE) IMPLANT
NEEDLE ASPIRATION VIZISHOT 19G (NEEDLE) ×2 IMPLANT
NEEDLE ASPIRATION VIZISHOT 21G (NEEDLE) ×2 IMPLANT
NEEDLE BLUNT 18X1 FOR OR ONLY (NEEDLE) IMPLANT
NEEDLE SUPERTRX PREMARK BIOPSY (NEEDLE) IMPLANT
NS IRRIG 1000ML POUR BTL (IV SOLUTION) ×3 IMPLANT
OIL SILICONE PENTAX (PARTS (SERVICE/REPAIRS)) ×3 IMPLANT
PAD ARMBOARD 7.5X6 YLW CONV (MISCELLANEOUS) ×6 IMPLANT
PATCHES PATIENT (LABEL) ×5 IMPLANT
RADIAL JAW LRG 4 PULMONARY (INSTRUMENTS) ×1
SYR 20ML ECCENTRIC (SYRINGE) ×3 IMPLANT
SYR 20ML LL LF (SYRINGE) ×3 IMPLANT
SYR 30ML LL (SYRINGE) ×2 IMPLANT
SYR 3ML LL SCALE MARK (SYRINGE) IMPLANT
SYR 5ML LL (SYRINGE) ×3 IMPLANT
SYR 5ML LUER SLIP (SYRINGE) ×2 IMPLANT
TOWEL GREEN STERILE (TOWEL DISPOSABLE) ×3 IMPLANT
TOWEL GREEN STERILE FF (TOWEL DISPOSABLE) ×3 IMPLANT
TRAP SPECIMEN MUCUS 40CC (MISCELLANEOUS) ×3 IMPLANT
TUBE CONNECTING 20X1/4 (TUBING) ×5 IMPLANT
UNDERPAD 30X36 HEAVY ABSORB (UNDERPADS AND DIAPERS) ×2 IMPLANT
VALVE BIOPSY  SINGLE USE (MISCELLANEOUS) ×1
VALVE BIOPSY SINGLE USE (MISCELLANEOUS) ×2 IMPLANT
VALVE SUCTION BRONCHIO DISP (MISCELLANEOUS) ×3 IMPLANT
WATER STERILE IRR 1000ML POUR (IV SOLUTION) ×3 IMPLANT

## 2020-04-22 NOTE — Progress Notes (Signed)
PT Cancellation Note  Patient Details Name: DEMETRIC PARSLOW MRN: [id number] DOB: 1941/07/19   Cancelled Treatment:    Reason Eval/Treat Not Completed: Patient at procedure or test/unavailable (Pt in OR for lung biopsy.  )   Denice Paradise 04/22/2020, 12:46 PM Delta Deshmukh W,PT [place] Pager:  [phone]  Office:  [phone]

## 2020-04-22 NOTE — Anesthesia Preprocedure Evaluation (Addendum)
Anesthesia Evaluation  Patient identified by MRN, date of birth, ID band Patient awake    Reviewed: Allergy & Precautions, NPO status , Patient's Chart, lab work & pertinent test results  Airway Mallampati: III  TM Distance: >3 FB Neck ROM: Full  Mouth opening: Limited Mouth Opening  Dental  (+) Dental Advisory Given, Teeth Intact   Pulmonary former smoker,    Pulmonary exam normal breath sounds clear to auscultation       Cardiovascular hypertension, Pt. on medications and Pt. on home beta blockers Normal cardiovascular exam+ dysrhythmias  Rhythm:Regular Rate:Normal     Neuro/Psych negative neurological ROS     GI/Hepatic Neg liver ROS, GERD  ,  Endo/Other  Morbid obesity  Renal/GU negative Renal ROS     Musculoskeletal negative musculoskeletal ROS (+)   Abdominal (+) + obese,   Peds  Hematology negative hematology ROS (+)   Anesthesia Other Findings   Reproductive/Obstetrics                            Anesthesia Physical Anesthesia Plan  ASA: III  Anesthesia Plan: General   Post-op Pain Management:    Induction: Intravenous  PONV Risk Score and Plan: 4 or greater and Ondansetron, Dexamethasone and Treatment may vary due to age or medical condition  Airway Management Planned: Oral ETT and Video Laryngoscope Planned  Additional Equipment: None  Intra-op Plan:   Post-operative Plan: Extubation in OR  Informed Consent: I have reviewed the patients History and Physical, chart, labs and discussed the procedure including the risks, benefits and alternatives for the proposed anesthesia with the patient or authorized representative who has indicated his/her understanding and acceptance.     Dental advisory given  Plan Discussed with: CRNA  Anesthesia Plan Comments:        Anesthesia Quick Evaluation

## 2020-04-22 NOTE — Progress Notes (Signed)
Subjective  Patient spoke with husband concerned that he cannot take care of her. Reported today that she would like assistance with making a decision on disposition. Team recommended and explained the purpose of SNF for patient.

## 2020-04-22 NOTE — Transfer of Care (Signed)
Immediate Anesthesia Transfer of Care Note  Patient: Leah Velasquez  Procedure(s) Performed: VIDEO BRONCHOSCOPY WITH ENDOBRONCHIAL ULTRASOUND (N/A )  Patient Location: PACU  Anesthesia Type:General  Level of Consciousness: awake and alert   Airway & Oxygen Therapy: Patient Spontanous Breathing and Patient connected to nasal cannula oxygen  Post-op Assessment: Report given to RN and Post -op Vital signs reviewed and stable  Post vital signs: Reviewed and stable  Last Vitals:  Vitals Value Taken Time  BP 108/54 04/22/20 1238  Temp    Pulse 56 04/22/20 1240  Resp 27 04/22/20 1240  SpO2 98 % 04/22/20 1240  Vitals shown include unvalidated device data.  Last Pain:  Vitals:   04/22/20 1035  TempSrc:   PainSc: 6       Patients Stated Pain Goal: 2 (49/61/16 4353)  Complications: No complications documented.

## 2020-04-22 NOTE — Op Note (Signed)
NAME: Leah Velasquez, Leah Velasquez MEDICAL RECORD ZO:[id number] ACCOUNT [id number] DATE OF BIRTH:[birth year] FACILITY: [facility] LOCATION: [facility] PHYSICIAN:Arath Kaigler Chaya Jan, MD  OPERATIVE REPORT  DATE OF PROCEDURE:  04/22/2020  PREOPERATIVE DIAGNOSIS:  Right upper lobe lung mass and mediastinal adenopathy.  POSTOPERATIVE DIAGNOSIS:  Neuroendocrine tumor.  PROCEDURE:   1. Bronchoscopy with brushings and endobronchial and transbronchial biopsies 2. Endobronchial ultrasound with mediastinal lymph node aspirations.  SURGEON:  Modesto Charon, MD  ASSISTANT:  None.  ANESTHESIA:  General.  FINDINGS:  Quick prep did show tumor cells.  CLINICAL NOTE:  [name] is a 79 year old woman with a history of previous lung cancer about 20 years ago.  She recently was found to have a right upper lobe lung nodule and hilar and paratracheal adenopathy.  She underwent a bronchoscopy, which  revealed a neuroendocrine tumor, but pathology was unable to differentiate small-cell versus nonsmall-cell.  She now returns for repeat bronchoscopy and endobronchial ultrasound to try to establish a more definitive diagnosis.  She had been scheduled for  broncoscopy previously, but was canceled due to COVID infection.  She then was admitted to the hospital and the procedure was moved up from tomorrow to today to expedite discharge planning.  She was aware of the indications, risks, benefits, and alternatives,  accepted the risks and agreed to proceed.  DESCRIPTION OF PROCEDURE:  [name] was brought to the operating room on 04/22/2020.  Planning for navigational bronchoscopy was performed, but navigational bronchoscopy was not necessary.  She had induction of general anesthesia and was intubated.  A timeout was performed.  Flexible fiberoptic bronchoscopy was performed via the endotracheal tube.  In the right upper lobe,  the segmental bronchus was narrowed.  This appeared to be mostly extrinsic compression, although there  was some unusual  appearance and at least some edema in the mucosa, if not definite tumor infiltration.  The bronchoscope was removed.  The endobronchial ultrasound scope was advanced.  A large right paratracheal node was identified.  Needle aspiration was performed using  a 19-gauge needle with realtime ultrasound visualization.  The specimen was placed on a slide for quick prep.  Multiple additional samples was obtained with suction and all of that tissue was placed into cell bloc for permanent pathology.  The quick  prep did show tumor cells consistent with a neuroendocrine tumor, but could not differentiate small-cell versus nonsmall-cell.  The bronchoscope then was reinserted and directed to the right upper lobe orifice.  Brushings were obtained from the segmental bronchus and these showed similar tumor cells.  Multiple endobronchial and transbronchial biopsies were obtained.  A total of  20 biopsies were taken.  These were all sent for permanent pathology.  Fluoroscopy was utilized during the biopsies.  The total fluoroscopy time was 1.9 minutes and the total dose was 18 mG.  There was some bleeding with biopsies and blood was  periodically cleared with saline and a dilute epinephrine solution.  After completing the biopsies, dilute epinephrine was applied.  The bronchoscope was removed.  Several minutes later the bronchoscope was replaced and there was no ongoing bleeding.   The patient then was extubated in the operating room and taken to the [facility] in good condition.  VN/NUANCE  D:04/22/2020 T:04/22/2020 JOB:[id number]

## 2020-04-22 NOTE — Progress Notes (Signed)
Subjective:  Patient spoke with husband concerned that he cannot take care of her. Reported today that she would like assistance with making a decision on disposition. Team recommended and explained the purpose of SNF for patient. She is agreeable to discharge to SNF.  Objective:  Vital signs in last 24 hours: Vitals:   04/21/20 0412 04/21/20 1233 04/21/20 1954 04/22/20 0352  BP: (!) 145/87 (!) 143/75 125/87 (!) 149/95  Pulse: 75 82 81 75  Resp: 18 18 18 17   Temp: 97.9 F (36.6 C) (!) 97.5 F (36.4 C) 99.7 F (37.6 C) 97.6 F (36.4 C)  TempSrc: Oral Oral Oral Oral  SpO2: 92% 96% 92% 94%  Weight:      Height:       Constitutional: appears comfortable this morning, in no acute distress Pulmonary/Chest: Normal work of breathing, no wheezing.  MSK: trace bilateral lower extremity to mid shin  Assessment/Plan:  Active Problems:   Generalized weakness   Non-traumatic rhabdomyolysis  Patient Summary:  Leah Velasquez is a 79 yo woman with history ofchronic atrial fibrillation on Coumadin,lung cancer s/pt right lower lobe lobe lobectomy, tobacco abuse, hypertension, hyperlipidemia, obesity, heart failure with preserved ejection fraction, rheumatoid arthritis, and recent Covid infection (ended quarantine on 04/10/2020) who presented on 04/16/20 after slipping off her toilet seat and getting stuck between her toilet and the shower for approximately 6 hours.  This is hospital day 5. The patient is medically cleared for discharge to SNF pending lung biopsy tomorrow.  Generalized weakness/disequilibrium Likely multifactorial, due to recent COVID infection, lung cancer recurrence, history of heart failure, overall deconditioning, electrolyte imbalances. PT/OT recommend SNF. We strongly recommend SNF. The patient acknowledges that she would benefit from SNF rehab. The patient is unsure whether she would like to go to SNF due to home obligations. There are concerns there may be financial  barriers to SNF placement.  - PT/OT evaluation: both recommending SNF - TOC working on SNF placement  Atrophic vaginitis vs. UTI Improvement in reported dysuria with premarin. Denies abdominal pain, no suprapubic tenderness. S/p 2 recent courses of antibiotics without effect. Urine noted to be cloudy this morning, will culture. Urinalysis on admission with large leukocytes and many bacteria, however patient asymptomatic at that time. Agreeable to SNF  - Premarin every other day - 8/30 urine culture was polymicrobial, likely secondary to contamination. Will continue to treat for atrophic vaginitis at this time and reassess for further work-up for UTI.  Urinary urgency Improvement in reported symptoms with initiation of darifenacin.  - darifenacin (Enablex)   Lung carcinoma Following with cardiothoracic surgery for biopsy which was postponed due to COVID infection.  - tolerated bronch/EBUS today by CT surgery with Dr. Roxan Hockey tomorrow - Will resume warfarin this afternoon  Atrial fibrillation -holding warfarin until bronch/EBUS today -INR 1.6 today, will restart on 5 mg warfarin this evening - Patient will need INR follow-up after discharge on 03/24/2020 - metoprolol 12.5 mg daily   Hypertension - furosemide 20 mg daily (home dose: 40 mg daily) - losartan 25 mg daily - metoprolol 12.5 mg daily  Heart failure with preserved EF - furosemide 20 mg daily (home dose: 40 mg daily) - losartan 25 mg daily - metoprolol 12.5 mg daily  Mild Cognitive Impairment Patient's family reports recent history of mild forgetfulness. Patient has displayed intermittent confusion during this admission.  - Delirium precautions  Diet: Heart Healthy IVF: None VTE: Warfarin Code: Full PT/OT recs: SNF for Subacute PT, bedside commode.  Prior to Admission  Living Arrangement: Home with husband Anticipated Discharge Location: [facility] [facility] Barriers to Discharge: SNF placement Dispo:  Anticipated discharge tomorrow  Iona Beard, MD 04/22/2020, 8:06 AM Pager: [phone] After 5pm on weekdays and 1pm on weekends: On Call pager [phone]

## 2020-04-22 NOTE — Anesthesia Postprocedure Evaluation (Signed)
Anesthesia Post Note  Patient: Leah Velasquez  Procedure(s) Performed: VIDEO BRONCHOSCOPY WITH ENDOBRONCHIAL ULTRASOUND (N/A )     Patient location during evaluation: PACU Anesthesia Type: General Level of consciousness: sedated and patient cooperative Pain management: pain level controlled Vital Signs Assessment: post-procedure vital signs reviewed and stable Respiratory status: spontaneous breathing Cardiovascular status: stable Anesthetic complications: no   No complications documented.  Last Vitals:  Vitals:   04/22/20 1310 04/22/20 1338  BP: (!) 141/76 130/68  Pulse: 97 82  Resp: (!) 23 16  Temp: 36.5 C (!) 36.4 C  SpO2: 97% (!) 87%    Last Pain:  Vitals:   04/22/20 1338  TempSrc: Oral  PainSc:                  Nolon Nations

## 2020-04-22 NOTE — OR Nursing (Signed)
Patient left OR 17 with ring still on left hand.

## 2020-04-22 NOTE — Anesthesia Procedure Notes (Addendum)
Procedure Name: Intubation Date/Time: 04/22/2020 11:02 AM Performed by: Valda Favia, CRNA Pre-anesthesia Checklist: Patient identified, Emergency Drugs available, Suction available and Patient being monitored Patient Re-evaluated:Patient Re-evaluated prior to induction Oxygen Delivery Method: Circle System Utilized Preoxygenation: Pre-oxygenation with 100% oxygen Induction Type: IV induction Ventilation: Mask ventilation without difficulty Laryngoscope Size: Glidescope and 3 Grade View: Grade I Tube type: Oral Tube size: 8.5 mm Number of attempts: 1 Airway Equipment and Method: Stylet and Oral airway Placement Confirmation: ETT inserted through vocal cords under direct vision,  positive ETCO2 and breath sounds checked- equal and bilateral Secured at: 23 cm Tube secured with: Tape Dental Injury: Teeth and Oropharynx as per pre-operative assessment

## 2020-04-22 NOTE — Progress Notes (Signed)
Nutrition Follow-up  DOCUMENTATION CODES:   Morbid obesity  INTERVENTION:   -Once diet is advanced:  -Continue Glucerna Shake po TID, each supplement provides 220 kcal and 10 grams of protein -Continue MVI with minerals daily -Continue feeding assistance with meals -Magic cup TID with meals, each supplement provides 290 kcal and 9 grams of protein  NUTRITION DIAGNOSIS:   Inadequate oral intake related to decreased appetite as evidenced by per patient/family report.  Ongoing  GOAL:   Patient will meet greater than or equal to 90% of their needs  Progressing   MONITOR:   PO intake, Supplement acceptance, Labs, Weight trends, Skin, I & O's  REASON FOR ASSESSMENT:   Consult Assessment of nutrition requirement/status  ASSESSMENT:   This is a 79 year old female with history of chronic atrial fibrillation on Coumadin lung cancer status post right lower lobe lobe lobectomy, tobacco abuse, hypertension, hyperlipidemia, obesity, heart failure with preserved ejection fraction, rheumatoid arthritis, and recent Covid infection(ended quarantine on 04/10/2020) presenting after slipping off her toilet seat and getting stuck between her toilet and the shower for approximately 6 hours.  9/1- s/p Procedure(s): VIDEO BRONCHOSCOPY WITH ENDOBRONCHIAL NAVIGATION (N/A) VIDEO BRONCHOSCOPY WITH ENDOBRONCHIAL ULTRASOUND (N/A)  Reviewed I/O's: +380 ml x 24 hours and +981 ml since admission  UOP: 300 ml x 24 hours  Pt down in OR at time of attempted visit.   Pt remains with poor appetite; noted meal completion 25%. Pt is consuming Glucerna supplements.   Medications reviewed and include lasix.   Per TOC notes, plan to d/c to SNF once medically stable.   Labs reviewed.   Diet Order:   Diet Order            Diet NPO time specified  Diet effective midnight                 EDUCATION NEEDS:   Education needs have been addressed  Skin:  Skin Assessment: Reviewed RN  Assessment  Last BM:  04/21/20  Height:   Ht Readings from Last 1 Encounters:  04/22/20 5\' 1"  (1.549 m)    Weight:   Wt Readings from Last 1 Encounters:  04/22/20 104.3 kg    Ideal Body Weight:  47.7 kg  BMI:  Body mass index is 43.46 kg/m.  Estimated Nutritional Needs:   Kcal:  1700-1900  Protein:  95-100 grams  Fluid:  > 1.7 L    Loistine Chance, RD, LDN, [place] Registered Dietitian II Certified Diabetes Care and Education Specialist Please refer to [facility] for RD and/or RD on-call/weekend/after hours pager

## 2020-04-22 NOTE — H&P (Signed)
PCP is Briscoe, Jannifer Rodney, MD Referring Provider is Curt Bears, MD      Chief Complaint  Patient presents with  . Lung Mass    Surgical eval, PET Scan 03/10/20, Chest CT 02/14/20,  Flexible video fiberoptic bronchoscopy with electromagnetic navigation and biopsies 02/18/20-Dr Byrum    HPI: Leah Velasquez is sent for consultation regarding a stage IIIb lung cancer.  Leah Velasquez is a 79 year old woman with a past history significant for lung cancer status post right lower lobectomy, tobacco abuse, hypertension, hyperlipidemia, obesity, decompensated diastolic left heart failure, and rheumatoid arthritis.  She underwent a right lower lobectomy for an early stage lung cancer in [place], [place] about 20 years ago.  She quit smoking around that time.  Recently she was admitted after a fall.  During her evaluation she was found to have a right upper lobe lung mass.  A CT of the chest showed a right upper lobe lung mass with probable invasion of the mediastinum, a second more central right upper lobe nodule and mediastinal and hilar adenopathy.  On PET CT these were all markedly hypermetabolic.  She had navigational bronchoscopy by Dr. Lamonte Sakai on 02/18/2020.  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  Pathology was not able to differentiate small cell carcinoma versus a nonsmall cell neuroendocrine tumor.  She saw Dr. Julien Nordmann in consultation, but he cannot initiate treatment until it is known whether this is small cell or non-small cell.  She currently is at home.  She is pretty much confined to a wheelchair.  She can get up and pivot with assistance.  As noted she quit smoking 20 years ago.  She has had some swelling in her legs.  She has been noncompliant with Lasix.  Zubrod Score: At the time of surgery this patient's most appropriate activity status/level should be described as: [] ?    0    Normal activity, no symptoms [] ?    1    Restricted in physical strenuous activity but ambulatory,  able to do out light work [] ?    2    Ambulatory and capable of self care, unable to do work activities, up and about >50 % of waking hours                              [x] ?    3    Only limited self care, in bed greater than 50% of waking hours [] ?    4    Completely disabled, no self care, confined to bed or chair [] ?    5    Moribund      Past Medical History:  Diagnosis Date  . A-fib ([place])   . Hemorrhoids   . Hyperlipidemia   . Hypertension   . Lung cancer ([place]) 2000   RLL  . Obesity   . Persistent atrial fibrillation ([place])   . Rheumatoid arthritis [facility])          Past Surgical History:  Procedure Laterality Date  . BREAST EXCISIONAL BIOPSY Left    x 2  . BREAST EXCISIONAL BIOPSY Right   . BRONCHIAL BIOPSY  02/18/2020   Procedure: BRONCHIAL BIOPSIES;  Surgeon: Collene Gobble, MD;  Location: [facility] ENDOSCOPY;  Service: Cardiopulmonary;;  . BRONCHIAL BRUSHINGS  02/18/2020   Procedure: BRONCHIAL BRUSHINGS;  Surgeon: Collene Gobble, MD;  Location: [place];  Service: Cardiopulmonary;;  . BRONCHIAL NEEDLE ASPIRATION BIOPSY  02/18/2020   Procedure: BRONCHIAL NEEDLE  ASPIRATION BIOPSIES;  Surgeon: Collene Gobble, MD;  Location: [place];  Service: Cardiopulmonary;;  . BRONCHIAL WASHINGS  02/18/2020   Procedure: BRONCHIAL WASHINGS;  Surgeon: Collene Gobble, MD;  Location: [place];  Service: Cardiopulmonary;;  . CARDIOVERSION N/A 01/09/2018   Procedure: CARDIOVERSION;  Surgeon: Josue Hector, MD;  Location: [facility] ENDOSCOPY;  Service: Cardiovascular;  Laterality: N/A;  . CARDIOVERSION N/A 03/01/2018   Procedure: CARDIOVERSION;  Surgeon: Sueanne Margarita, MD;  Location: [facility] ENDOSCOPY;  Service: Cardiovascular;  Laterality: N/A;  . ELECTROMAGNETIC NAVIGATION BROCHOSCOPY N/A 02/18/2020   Procedure: ELECTROMAGNETIC NAVIGATION BRONCHOSCOPY;  Surgeon: Collene Gobble, MD;  Location: [place];  Service: Cardiopulmonary;  Laterality: N/A;  . KNEE SURGERY     . LUNG LOBECTOMY Right 2000   RLL removed  . VIDEO BRONCHOSCOPY N/A 02/18/2020   Procedure: VIDEO BRONCHOSCOPY WITH FLUORO;  Surgeon: Collene Gobble, MD;  Location: [facility] ENDOSCOPY;  Service: Cardiopulmonary;  Laterality: N/A;    No family history on file.  Social History Social History       Tobacco Use  . Smoking status: Never Smoker  . Smokeless tobacco: Never Used  Vaping Use  . Vaping Use: Never used  Substance Use Topics  . Alcohol use: No  . Drug use: No          Current Outpatient Medications  Medication Sig Dispense Refill  . albuterol (VENTOLIN HFA) 108 (90 Base) MCG/ACT inhaler Inhale 1-2 puffs into the lungs every 6 (six) hours as needed for wheezing or shortness of breath.     . calcium carbonate (TUMS - DOSED IN MG ELEMENTAL CALCIUM) 500 MG chewable tablet Chew 1 tablet (200 mg of elemental calcium total) by mouth 3 (three) times daily as needed for indigestion or heartburn.    . Cholecalciferol (VITAMIN D3) 1000 units CAPS Take 1,000 Units by mouth daily.    Marland Kitchen conjugated estrogens (PREMARIN) vaginal cream Place 1 Applicatorful vaginally 2 (two) times a week.    . esomeprazole (NEXIUM) 20 MG packet Take 20 mg by mouth daily as needed (Indigestion).     . fluticasone (FLONASE) 50 MCG/ACT nasal spray Place 1 spray into both nostrils daily.     . furosemide (LASIX) 40 MG tablet TAKE 1 TABLET EVERY DAY 90 tablet 0  . hydrocortisone (ANUSOL-HC) 2.5 % rectal cream Apply rectally 2 times daily (Patient taking differently: Place 1 application rectally 2 (two) times daily as needed for hemorrhoids. ) 28.35 g 1  . levocetirizine (XYZAL) 5 MG tablet Take 5 mg by mouth every evening.     . lidocaine (LIDODERM) 5 % Place 1 patch onto the skin daily. Remove & Discard patch within 12 hours or as directed by MD 30 patch 0  . losartan (COZAAR) 50 MG tablet TAKE 1 TABLET DAILY. PLEASE CALL TO SCHEDULE APPOINTMENT FOR FURTHER REFILLS. 90 tablet 0  . simvastatin  (ZOCOR) 20 MG tablet Take 20 mg by mouth at bedtime.    . timolol (TIMOPTIC) 0.5 % ophthalmic solution Place 1 drop into both eyes in the morning and at bedtime.    . traMADol (ULTRAM) 50 MG tablet Take 50 mg by mouth daily as needed for moderate pain.   0  . vitamin B-12 1000 MCG tablet Take 1 tablet (1,000 mcg total) by mouth daily. 30 tablet 0  . warfarin (COUMADIN) 5 MG tablet Take 2.5-5 mg by mouth See admin instructions. Take 2.5mg  Sunday, Monday, Wednesday and Friday then take 5mg  Sat, Tues, Thurs    .  zolpidem (AMBIEN) 5 MG tablet Take 2.5 mg by mouth at bedtime.    . metoprolol succinate (TOPROL XL) 25 MG 24 hr tablet Take 0.5 tablets (12.5 mg total) by mouth at bedtime. 45 tablet 3   No current facility-administered medications for this visit.         Allergies  Allergen Reactions  . Morphine Anaphylaxis  . Morphine And Related Other (See Comments)    "vitals go down"  . Morphine And Related     Pass out   . Sulfa Antibiotics Rash  . Sulfa Antibiotics Rash  . Sulfa Antibiotics Rash    Review of Systems  Constitutional: Positive for activity change, fatigue and unexpected weight change (Weight gain). Negative for appetite change.  HENT: Negative for trouble swallowing and voice change.   Respiratory: Positive for shortness of breath. Negative for wheezing.   Cardiovascular: Positive for palpitations and leg swelling. Negative for chest pain.  Gastrointestinal: Positive for abdominal pain (Reflux), constipation and diarrhea.  Genitourinary: Positive for dysuria and frequency.  Musculoskeletal: Positive for arthralgias and joint swelling.  Neurological: Positive for dizziness, syncope and weakness (Generalized). Negative for seizures.  Hematological: Bruises/bleeds easily (Warfarin).  Psychiatric/Behavioral: The patient is nervous/anxious.   All other systems reviewed and are negative.   BP 140/84   Pulse 86   Temp 97.6 F (36.4 C) (Skin)   Resp  20   Ht 5\' 1"  (1.549 m)   Wt 231 lb (104.8 kg)   SpO2 95% Comment: RA  BMI 43.65 kg/m  Physical Exam Vitals reviewed.  Constitutional:      General: She is not in acute distress.    Appearance: She is obese. She is ill-appearing.  HENT:     Head: Normocephalic and atraumatic.  Eyes:     Extraocular Movements: Extraocular movements intact.  Neck:     Vascular: No carotid bruit.  Cardiovascular:     Rate and Rhythm: Rhythm irregular.     Heart sounds: No murmur heard.   Pulmonary:     Effort: Pulmonary effort is normal. No respiratory distress.     Breath sounds: No wheezing or rales.     Comments: Diminished breath sounds at right base Abdominal:     General: There is no distension.     Palpations: Abdomen is soft.  Musculoskeletal:     Cervical back: Neck supple.     Right lower leg: Edema (3+) present.     Left lower leg: Edema (3+) present.  Lymphadenopathy:     Cervical: No cervical adenopathy.  Skin:    General: Skin is warm and dry.  Neurological:     General: No focal deficit present.     Mental Status: She is oriented to person, place, and time.     Motor: No weakness.    Diagnostic Tests: NUCLEAR MEDICINE PET SKULL BASE TO THIGH  TECHNIQUE: 11.57 mCi F-18 FDG was injected intravenously. Full-ring PET imaging was performed from the skull base to thigh after the radiotracer. CT data was obtained and used for attenuation correction and anatomic localization.  Fasting blood glucose: 121 mg/dl  COMPARISON: CT chest from 02/14/2020  FINDINGS: Mediastinal blood pool activity: SUV max 4.18  Liver activity: SUV max NA  NECK: No hypermetabolic lymph nodes in the neck.  Incidental CT findings: none  CHEST: Right paratracheal lymph node measures 2.1 x 2.0 cm with SUV max of 15.98.  Right hilar lymph node is FDG avid measuring approximately 1.5 cm and has an SUV max  of 11.27. No FDG avid lymph nodes within the left side of mediastinum or left  hilar region.  There is a subpleural mass within the anteromedial right lung apex which invades the superior mediastinum measuring 3.0 x 2.0 cm, image 46/4. The SUV max is equal to 24.2.  Incidental CT findings: Calcified granuloma is identified within the posterior right lung base, image 45/8. Mild cardiac enlargement. Aortic atherosclerosis. Lad and RCA coronary artery atherosclerotic calcifications identified.  ABDOMEN/PELVIS: No abnormal FDG uptake within the liver, pancreas, spleen, or adrenal glands. FDG avid lymph node within the right lower quadrant mesentery measures 0.8 cm and has an SUV max of 7.9.  Incidental CT findings: Nonobstructing stone identified within inferior pole of right kidney. Aortic atherosclerosis without aneurysm. Calcification within the posterior right bladder measures 4 mm.  SKELETON: No focal hypermetabolic activity to suggest skeletal metastasis.  Incidental CT findings: Focal area of increased tracer uptake localizing to the sacrococcygeal junction. The SUV max is equal to 5.08. Given the absence of additional foci of increased uptake this is favored to represent arthro pathic changes. No aggressive lytic or sclerotic bone lesions identified.  IMPRESSION: 1. FDG avid lesions within the paramediastinal aspect of the right apex and right upper lobe concerning for primary bronchogenic carcinoma. 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 2. Single subcentimeter lymph node within the right lower quadrant small bowel mesentery is FDG avid within SUV max of 7.9. This is of uncertain clinical significance and would be an unusual focus of metastasis given absence of disease elsewhere in the abdomen and pelvis. 3. Small focus of moderate uptake localizing to the sacrococcygeal junction is favored to represent arthropathic changes. No additional foci of increased tracer activity identified within the  imaged portions of the axial and appendicular skeleton. 4. Aortic Atherosclerosis (ICD10-I70.0). Coronary artery calcifications.   Electronically Signed By: Kerby Moors M.D. On: 03/11/2020 09:12  Impression: Leah Velasquez is a 79 year old former smoker with a history of lung cancer about 20 years ago.  She also has a history of hypertension, hyperlipidemia, persistent atrial fibrillation, obesity, recent syncopal episode, rheumatoid arthritis, and pseudogout.  She recently was admitted after a syncopal spell.  During her work-up she was found to have a right upper lobe lung mass.  Further work-up revealed a right upper lobe lung mass with possible mediastinal invasion, a second more central nodule in the right upper lobe, and hilar and mediastinal adenopathy.  She underwent a navigational bronchoscopy which showed malignancy with neuroendocrine features.  Unfortunately there was not sufficient tissue to differentiate between small cell carcinoma and non-small cell (large cell neuroendocrine).  She saw Dr. Julien Nordmann from oncology.  He needs additional tissue to determine the definitive diagnosis so that appropriate treatment can be initiated.  Plan is for chemo and radiation.  The issue is selecting the appropriate chemotherapeutic agents.  I recommended that we repeat a navigational bronchoscopy and also do an endobronchial ultrasound.  She has had navigational bronchoscopy before and is well aware of the nature of the procedure.  We would plan to do this under general anesthesia so that we can do repeated sampling.  I informed her and her husband of the indications, risks, benefits, and alternatives.  They understand this is diagnostic and not therapeutic.  They understand the risks include, but are not limited to death, MI, DVT, PE, stroke, bleeding, pneumothorax, and failure to obtain the sufficient tissue for definitive diagnosis.  She accepts the risks and agrees to proceed.  She is  on warfarin.  She will need to hold that for 5 days prior to the procedure.  She was subtherapeutic at her last check but her dose was increased since then.  First available opportunity for biopsy is next Wednesday, 04/01/2020  Plan: Electromagnetic navigational bronchoscopy and endobronchial ultrasound on Wednesday, 04/01/2020.  We will plan to do this as an outpatient procedure  I spent 45 minutes in review of images, records, and in consultation with Leah Velasquez today. [name], MD [facility] [phone]        Electronically signed by [name], MD at 03/23/2020 4:18 PM  Leah Velasquez was originally scheduled for bronch/ EBUS on 8/11 but procedure cancelled when she tested positive for COVID. She was rescheduled for tomorrow AM. She was admitted on 8/26 after a fall at home. Her planned dc is to a SNF and a bed should be available soon. Her coumadin has been held and she has been on heparin. Her INR yesterday was 1.6. In order to facilitate discharge and simply matters for her we have moved her bronch/ EBUS up to today.   She is agreeable to change in schedule.  Plan navigational bronch/ EBUS later this AM  NPO  Can probably restart coumadin this evening, but should not be on heparin for at least 24 hours  Remo Lipps C. Roxan Hockey, MD [facility] [phone]

## 2020-04-22 NOTE — Brief Op Note (Signed)
04/16/2020 - 04/22/2020  12:22 PM  PATIENT:  Leah Velasquez  79 y.o. 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  PROCEDURE:  Procedure(s): VIDEO BRONCHOSCOPY WITH ENDOBRONCHIAL ULTRASOUND (N/A) Mediastinal lymph node needle aspirations Brushings and endobronchial and transbronchial biopsies  SURGEON:  Surgeon(s) and Role:    [name], MD - Primary  PHYSICIAN ASSISTANT:   ASSISTANTS: none   ANESTHESIA:   general  EBL: minimal  BLOOD ADMINISTERED:none  DRAINS: none   LOCAL MEDICATIONS USED:  NONE  SPECIMEN:  Source of Specimen:  4R node, RUL mass  DISPOSITION OF SPECIMEN:  PATHOLOGY  COUNTS:  NO endoscopic  TOURNIQUET:  * No tourniquets in log *  DICTATION: .Other Dictation: Dictation Number -  PLAN OF CARE: return to inpatient  PATIENT DISPOSITION:  PACU - hemodynamically stable.   Delay start of Pharmacological VTE agent (>24hrs) due to surgical blood loss or risk of bleeding: yes

## 2020-04-22 NOTE — Progress Notes (Signed)
  Date: 04/22/2020  Patient name: Leah Velasquez  Medical record number: [id number]  Date of birth: March 23, 1941        I have seen and evaluated this patient and I have discussed the plan of care with the house staff. Please see Dr. Youlanda Mighty note for complete details. I concur with her findings and plan.  Patient will discharge tomorrow to [facility] to hopefully get rehab and return to her previous level of functioning.   Sid Falcon, MD 04/22/2020, 4:37 PM

## 2020-04-22 NOTE — Interval H&P Note (Signed)
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  The various methods of treatment have been discussed with the patient and family. After consideration of risks, benefits and other options for treatment, the patient has consented to  Procedure(s): VIDEO BRONCHOSCOPY WITH ENDOBRONCHIAL NAVIGATION (N/A) VIDEO BRONCHOSCOPY WITH ENDOBRONCHIAL ULTRASOUND (N/A) as a surgical intervention.  The patient's history has been reviewed, patient examined, no change in status, stable for surgery.  I have reviewed the patient's chart and labs.  Questions were answered to the patient's satisfaction.     [name]

## 2020-04-23 ENCOUNTER — Encounter (HOSPITAL_COMMUNITY): Payer: Self-pay | Admitting: Thoracic Surgery (Cardiothoracic Vascular Surgery)

## 2020-04-23 DIAGNOSIS — R531 Weakness: Secondary | ICD-10-CM | POA: Diagnosis not present

## 2020-04-23 DIAGNOSIS — E878 Other disorders of electrolyte and fluid balance, not elsewhere classified: Secondary | ICD-10-CM | POA: Diagnosis not present

## 2020-04-23 DIAGNOSIS — M6282 Rhabdomyolysis: Secondary | ICD-10-CM | POA: Diagnosis not present

## 2020-04-23 DIAGNOSIS — C3411 Malignant neoplasm of upper lobe, right bronchus or lung: Secondary | ICD-10-CM | POA: Diagnosis not present

## 2020-04-23 DIAGNOSIS — N952 Postmenopausal atrophic vaginitis: Secondary | ICD-10-CM | POA: Diagnosis not present

## 2020-04-23 LAB — BASIC METABOLIC PANEL
Anion gap: 13 (ref 5–15)
BUN: 17 mg/dL (ref 8–23)
CO2: 23 mmol/L (ref 22–32)
Calcium: 9.3 mg/dL (ref 8.9–10.3)
Chloride: 102 mmol/L (ref 98–111)
Creatinine, Ser: 0.95 mg/dL (ref 0.44–1.00)
GFR calc Af Amer: >60 mL/min (ref >60)
GFR calc non Af Amer: 58 mL/min — ABNORMAL LOW (ref >60)
Glucose, Bld: 99 mg/dL (ref 70–99)
Potassium: 4.6 mmol/L (ref 3.5–5.1)
Sodium: 138 mmol/L (ref 135–145)

## 2020-04-23 LAB — PROTIME-INR
INR: 1.3 — ABNORMAL HIGH (ref 0.8–1.2)
Prothrombin Time: 15.6 seconds — ABNORMAL HIGH (ref 11.4–15.2)

## 2020-04-23 MED ORDER — ZOLPIDEM TARTRATE 5 MG PO TABS: 5.0000 mg | ORAL_TABLET | Freq: Every evening | ORAL | 0 refills | Status: DC | PRN

## 2020-04-23 MED ORDER — WARFARIN SODIUM 5 MG PO TABS
5.0000 mg | ORAL_TABLET | Freq: Once | ORAL | Status: AC
  Administered 2020-04-23: 5 mg via ORAL
  Filled 2020-04-23: qty 1

## 2020-04-23 NOTE — Discharge Instructions (Signed)

## 2020-04-23 NOTE — Plan of Care (Signed)

## 2020-04-23 NOTE — Progress Notes (Signed)
RN attempted to give report to SNF.

## 2020-04-23 NOTE — Progress Notes (Signed)
Subjective:  No acute overnight events. Sore throat s/p procedure but antichloraseptic helps. Patient reports decreased cough. Husband called and updated on plan to discharge with SNF today.  Objective:  Vital signs in last 24 hours: Vitals:   04/22/20 2237 04/23/20 0441 04/23/20 0458 04/23/20 1047  BP: (!) 157/79 (!) 164/75 (!) 147/88 (!) 152/88  Pulse: 84 98 76 74  Resp: 18 (!) 32 (!) 22   Temp: 97.6 F (36.4 C) 97.7 F (36.5 C) 97.8 F (36.6 C)   TempSrc: Oral Oral Oral   SpO2: 97% 93% 94%   Weight:      Height:       Physical Exam  Constitutional: Appears well rested and computable No distress.  Pulmonary: Effort normal. No cough or hemoptysis. Normal breath sounds. GI: Soft, non tender Musculoskeletal: trace pretibial edema.   Assessment/Plan:  Active Problems:   Generalized weakness   Non-traumatic rhabdomyolysis  Patient Summary:  Leah Velasquez is a 79 yo woman with history ofchronic atrial fibrillation on Coumadin,lung cancer s/pt right lower lobe lobe lobectomy, tobacco abuse, hypertension, hyperlipidemia, obesity, heart failure with preserved ejection fraction, rheumatoid arthritis, and recent Covid infection (ended quarantine on 04/10/2020) who presented on 04/16/20 after slipping off her toilet seat and getting stuck between her toilet and the shower for approximately 6 hours.  This is hospital day5.The patient is medically cleared for discharge to SNF.  Generalized weakness/disequilibrium Likely multifactorial, due to recent COVID infection, lung cancer recurrence, history of heart failure, overall deconditioning, electrolyte imbalances. PT/OT recommend SNF.We strongly recommend SNF. The patient acknowledges that she would benefit from SNF rehab. The patient is unsure whether she would like to go to SNF due to home obligations. There are concerns there may be financial barriers to SNF placement. - PT/OT evaluation: both recommending SNF - She has  placement at [facility]   Atrophic vaginitisvs. UTI Improvement in reported dysuria with premarin. Denies abdominal pain, no suprapubic tenderness. S/p 2 recent courses of antibiotics without effect. Urine noted to be cloudy this morning, will culture. Urinalysis on admission with large leukocytes and many bacteria, however patient asymptomatic at that time. Agreeable to SNF  - Premarin every other day -8/30 urine culturewas polymicrobial, likely secondary to contamination. Will continue to treat for atrophic vaginitis at this time and reassess for further work-up for UTI.  Urinary urgency Improvement in reported symptoms with initiation of darifenacin.  - darifenacin (Enablex)   Lung carcinoma Following with cardiothoracic surgery for biopsy which was postponed due to COVID infection.  -tolerated bronch/EBUS well no hemoptysis  -plan for follow up on results with Dr. Julien Nordmann. -Warfarin started 04/22/2020  Atrial fibrillation -holding warfarin until bronch/EBUS today -INR 1.3 today, restarted on 5 mg warfarin - Patient will need INR follow-up after discharge on 03/24/2020 - metoprolol 12.5 mg daily   Hypertension - furosemide 20 mg daily (home dose: 40 mg daily) - losartan 25 mg daily - metoprolol 12.5 mg daily  Heart failure with preserved EF - furosemide 20 mg daily (home dose: 40 mg daily) - losartan 25 mg daily - metoprolol 12.5 mg daily  Mild Cognitive Impairment Patient's family reports recent history of mild forgetfulness. Patient has displayed intermittent confusion during this admission.  - Delirium precautions  Diet: Heart Healthy IVF: None VTE: Warfarin Code: Full PT/OT recs: SNF for Subacute PT, bedside commode.  Prior to Admission Living Arrangement: Home with husband Anticipated Discharge Location: [facility] Dispo: Anticipated discharge today  Iona Beard, MD 04/23/2020, 10:54  AM Pager: [phone] After 5pm on weekdays and 1pm on  weekends: On Call pager [phone]

## 2020-04-23 NOTE — Progress Notes (Signed)
      [address]       [place] [address]             [phone]      C/o sore throat, multiple other complaints  BP (!) 147/88 (BP Location: Left Arm)   Pulse 76   Temp 97.8 F (36.6 C) (Oral)   Resp (!) 22   Ht 5\' 1"  (1.549 m)   Wt 104.3 kg   SpO2 94%   BMI 43.46 kg/m   No significant hemoptysis  Warfarin restarted last night  OK for dc from my standpoint F/u with Dr. Lisa Roca C. Roxan Hockey, MD [facility] [phone]'

## 2020-04-23 NOTE — Progress Notes (Signed)
   04/23/20 0441  Assess: MEWS Score  Temp 97.7 F (36.5 C)  BP (!) 164/75 (notified bedside RN)  Pulse Rate 98  Resp (!) 32  SpO2 93 %  O2 Device Room Air  Patient Activity (if Appropriate) In bed  Assess: MEWS Score  MEWS Temp 0  MEWS Systolic 0  MEWS Pulse 0  MEWS RR 2  MEWS LOC 1  MEWS Score 3  MEWS Score Color Yellow  Assess: if the MEWS score is Yellow or Red  Were vital signs taken at a resting state? Yes  Focused Assessment No change from prior assessment  Early Detection of Sepsis Score *See Row Information* Low  MEWS guidelines implemented *See Row Information* No, vital signs rechecked  Treat  MEWS Interventions Escalated (See documentation below) (charge RN notified)  Pain Score Asleep  Escalate  MEWS: Escalate Yellow: discuss with charge nurse/RN and consider discussing with provider and RRT  Notify: Charge Nurse/RN  Name of Charge Nurse/RN Notified [place]  Date Charge Nurse/RN Notified 04/23/20  Time Charge Nurse/RN Notified 0445  Document  Patient Outcome Other (Comment) (resting comfortably )  Progress note created (see row info) Yes

## 2020-04-23 NOTE — Progress Notes (Signed)
D/C instructions printed and placed in packet at nurse's stations for PTAR. IV removed, tolerated well. MD's rounding on pt and then PTAR to transport.

## 2020-04-23 NOTE — Progress Notes (Signed)
PT Cancellation Note  Patient Details Name: Leah Velasquez MRN: [id number] DOB: 11-08-1940   Cancelled Treatment:    Reason Eval/Treat Not Completed: (P) Other (comment) (Pt to d/c to snf today, will defer PT needs to next level of care.)   Yalitza Teed Eli Hose 04/23/2020, 11:30 AM  Erasmo Leventhal , PTA Acute Rehabilitation Services Pager [phone] Office [phone]

## 2020-04-23 NOTE — Progress Notes (Addendum)
ANTICOAGULATION CONSULT NOTE - Follow Up Consult  Pharmacy Consult for warfarin Indication: atrial fibrillation  Allergies  Allergen Reactions  . Morphine And Related Anaphylaxis    Vitals drop down and pass out  . Sulfa Antibiotics Rash    Patient Measurements: Height: 5\' 1"  (154.9 cm) Weight:  (refused weight) IBW/kg (Calculated) : 47.8  Vital Signs: Temp: 97.9 F (36.6 C) (09/02 1200) Temp Source: Oral (09/02 1200) BP: 132/62 (09/02 1200) Pulse Rate: 76 (09/02 1200)  Labs: Recent Labs    04/21/20 0727 04/21/20 1807 04/22/20 1850 04/23/20 0509 04/23/20 0725  HGB  --   --  14.0  --   --   HCT  --   --  43.3  --   --   PLT  --   --  187  --   --   LABPROT  --  18.7*  --  15.6*  --   INR  --  1.6*  --  1.3*  --   CREATININE 0.86  --   --   --  0.95    Estimated Creatinine Clearance: 55.1 mL/min (by C-G formula based on SCr of 0.95 mg/dL).   Medications:  Scheduled:  . Chlorhexidine Gluconate Cloth  6 each Topical Q0600  . conjugated estrogens  1 Applicatorful Vaginal QODAY  . darifenacin  7.5 mg Oral Daily  . diclofenac Sodium  4 g Topical QID  . feeding supplement (GLUCERNA SHAKE)  237 mL Oral TID BM  . fluticasone  1 spray Each Nare Daily  . furosemide  20 mg Oral Daily  . heparin  5,000 Units Subcutaneous Q8H  . losartan  25 mg Oral Daily  . metoprolol succinate  12.5 mg Oral Daily  . multivitamin with minerals  1 tablet Oral Daily  . mupirocin ointment  1 application Nasal BID  . potassium chloride  20 mEq Oral BID  . timolol  1 drop Both Eyes Daily  . warfarin  5 mg Oral Once  . Warfarin - Pharmacist Dosing Inpatient   Does not apply q1600    Assessment: 79 year old female w/ PMH of atrial fibrillation on PTA warfarin (CHADSVASC = 4).  Pharmacy consulted to dose.    Warfarin was held for lung biopsy on 9/1 and was given SQ heparin.  Patient restarted warfarin 5mg  on 9/1 PM post biopsy.  INR today 1.3.  Patient to be transferred to SNF this  afternoon. She will receive warfarin 5mg  before discharge and plan for SNF to check INR on 9/3.     Goal of Therapy:  INR 2-3 Monitor platelets by anticoagulation protocol: Yes   Plan:  -Warfarin 5mg  given at [facility] today before discharge -SNF to check INR 9/3 -Witnessed nurse write on d/c summary for SNF to NOT give warfarin tonight and that she needs INR check 9/3  Dimple Nanas, PharmD PGY-1 Acute Care Pharmacy Resident Office: [phone] 04/23/2020 1:56 PM

## 2020-04-24 ENCOUNTER — Non-Acute Institutional Stay (SKILLED_NURSING_FACILITY): Payer: Medicare HMO | Admitting: Internal Medicine

## 2020-04-24 ENCOUNTER — Encounter (HOSPITAL_COMMUNITY): Payer: Self-pay | Admitting: Emergency Medicine

## 2020-04-24 ENCOUNTER — Encounter: Payer: Self-pay | Admitting: Internal Medicine

## 2020-04-24 DIAGNOSIS — I4891 Unspecified atrial fibrillation: Secondary | ICD-10-CM

## 2020-04-24 DIAGNOSIS — C7A8 Other malignant neuroendocrine tumors: Secondary | ICD-10-CM | POA: Diagnosis not present

## 2020-04-24 DIAGNOSIS — N952 Postmenopausal atrophic vaginitis: Secondary | ICD-10-CM

## 2020-04-24 DIAGNOSIS — F5101 Primary insomnia: Secondary | ICD-10-CM

## 2020-04-24 DIAGNOSIS — C3411 Malignant neoplasm of upper lobe, right bronchus or lung: Secondary | ICD-10-CM

## 2020-04-24 DIAGNOSIS — R531 Weakness: Secondary | ICD-10-CM | POA: Diagnosis not present

## 2020-04-24 DIAGNOSIS — N3281 Overactive bladder: Secondary | ICD-10-CM

## 2020-04-24 DIAGNOSIS — D6869 Other thrombophilia: Secondary | ICD-10-CM

## 2020-04-24 DIAGNOSIS — Z789 Other specified health status: Secondary | ICD-10-CM

## 2020-04-24 DIAGNOSIS — I4821 Permanent atrial fibrillation: Secondary | ICD-10-CM | POA: Diagnosis not present

## 2020-04-24 DIAGNOSIS — M170 Bilateral primary osteoarthritis of knee: Secondary | ICD-10-CM

## 2020-04-24 DIAGNOSIS — M069 Rheumatoid arthritis, unspecified: Secondary | ICD-10-CM | POA: Insufficient documentation

## 2020-04-24 LAB — SURGICAL PATHOLOGY

## 2020-04-24 LAB — CYTOLOGY - NON PAP

## 2020-04-24 MED ORDER — DICLOFENAC SODIUM 1 % EX GEL: 4.0000 g | Freq: Four times a day (QID) | CUTANEOUS | 1 refills | Status: AC

## 2020-04-24 NOTE — Progress Notes (Signed)
Provider:  Rexene Edison. Mariea Clonts, D.O., C.M.D. Location:  [place] Room Number: 103 Place of Service:   SNF  PCP: [name], MD Patient Care Team: [name], MD as PCP - General (Family Medicine) Lorretta Harp, MD as PCP - Cardiology (Cardiology) [name], MD (Family Medicine) Doreene Nest, Jannifer Rodney, MD (Family Medicine) Doreene Nest Jannifer Rodney, MD (Family Medicine)  Extended Emergency Contact Information Primary Emergency Contact: Gang,James Address: [address]          [place], [place] [address] Johnnette Litter of [place] Phone: [phone] Relation: Spouse Secondary Emergency Contact: [place], [place] Phone: [phone] Relation: Daughter  Code Status: FULL CODE Goals of Care: Advanced Directive information Advanced Directives 04/24/2020  Does Patient Have a Medical Advance Directive? No  Type of Advance Directive -  Copy of [place] in Chart? -  Would patient like information on creating a medical advance directive? No - Patient declined   Chief Complaint  Patient presents with  . New Admit To SNF    New Admin     HPI: Patient is a 79 y.o. 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  She was living at home with her husband, uses a walker to get around, but can stand and pivot with assistance.  Zubrod score was 3 (only limited self care, in bed greater than 50% of waking hours) per hospital H&P.    CT of chest showed a right upper lobe lung mass with probably invasion of her  mediastinum, a second more central RUL nodule, and mediastinal and hilar lymphadenopathy.  PET CT indicated they were hypermetabolic.  She underwent navigational bronchoscopy by Dr. BLamonte Sakaion 02/18/20 that showed her malignancy was neuroendocrine but pathology could not distinguish small cell vs nonsmall cell neuroendocrine tumor.  Dr. MJulien Nordmanncould not initiate tx without that information so she was intended to undergo a navigational bronchoscopy and EBUS for a tissue sample; however, she tested positive for covid-19 on 8/9.  Has had two negative tests since--latest 8/31.  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 The proliferative rate by Ki-67 is increased (greater than 50%). Overall, the morphology and immunophenotype are consistent with small cell carcinoma."  The plan has been for chemo and radiation for her.    She had her warfarin held and then restarted and is back on 5461mdaily now and INR was done today--1.7.   She had been on 2.61m61mun/tues/wed/thurs/sat and 61mg69mn and Friday before it got held so likely she will get back to that dose after we get her therapeutic over 2.  She has atrophic vaginitis and is getting topical estrogen cream. She was also started on enablex for OAB.   When seen, she reported that at the hospital she was receiving a wonderful cream for her knees and ankles (?voltaren).  She was also getting ambien to help her rest and this is ordered here already, but she has to ask for it.  She  has had her moderna covid vaccines 11/12/19 and 12/10/19.    Past Medical History:  Diagnosis Date  . A-fib ([place])   . Dysrhythmia   . GERD (gastroesophageal reflux disease)   . Hemorrhoids   . Hyperlipidemia   . Hypertension   . Lung cancer ([place]) 2000   RLL  . Obesity   . Persistent atrial fibrillation ([place])   . Rheumatoid arthritis [facility])    Past Surgical History:  Procedure Laterality  Date  . BREAST EXCISIONAL BIOPSY Left    x 2  . BREAST EXCISIONAL BIOPSY Right   . BRONCHIAL BIOPSY  02/18/2020   Procedure: BRONCHIAL BIOPSIES;  Surgeon: Collene Gobble, MD;  Location: [facility] ENDOSCOPY;  Service: Cardiopulmonary;;  . BRONCHIAL BRUSHINGS  02/18/2020   Procedure: BRONCHIAL BRUSHINGS;  Surgeon: Collene Gobble, MD;  Location: [place];  Service: Cardiopulmonary;;  . BRONCHIAL NEEDLE ASPIRATION BIOPSY  02/18/2020   Procedure: BRONCHIAL NEEDLE ASPIRATION BIOPSIES;  Surgeon: Collene Gobble, MD;  Location: [place];  Service: Cardiopulmonary;;  . BRONCHIAL WASHINGS  02/18/2020   Procedure: BRONCHIAL WASHINGS;  Surgeon: Collene Gobble, MD;  Location: [place];  Service: Cardiopulmonary;;  . CARDIOVERSION N/A 01/09/2018   Procedure: CARDIOVERSION;  Surgeon: Josue Hector, MD;  Location: [facility] ENDOSCOPY;  Service: Cardiovascular;  Laterality: N/A;  . CARDIOVERSION N/A 03/01/2018   Procedure: CARDIOVERSION;  Surgeon: Sueanne Margarita, MD;  Location: [facility] ENDOSCOPY;  Service: Cardiovascular;  Laterality: N/A;  . ELECTROMAGNETIC NAVIGATION BROCHOSCOPY N/A 02/18/2020   Procedure: ELECTROMAGNETIC NAVIGATION BRONCHOSCOPY;  Surgeon: Collene Gobble, MD;  Location: [place];  Service: Cardiopulmonary;  Laterality: N/A;  . KNEE SURGERY    . LUNG LOBECTOMY Right 2000   RLL removed  . VIDEO BRONCHOSCOPY N/A 02/18/2020   Procedure: VIDEO BRONCHOSCOPY WITH FLUORO;  Surgeon: Collene Gobble, MD;  Location: [facility] ENDOSCOPY;  Service: Cardiopulmonary;  Laterality: N/A;  . VIDEO BRONCHOSCOPY WITH ENDOBRONCHIAL ULTRASOUND N/A 04/22/2020   Procedure: VIDEO BRONCHOSCOPY WITH ENDOBRONCHIAL ULTRASOUND;  Surgeon: [name], MD;  Location: [facility] OR;  Service: Thoracic;  Laterality: N/A;    Social History   Socioeconomic History  . Marital status: Married    Spouse name: Not on file  . Number of children: Not on file  . Years of education: Not on file  . Highest education level: Not on file   Occupational History  . Not on file  Tobacco Use  . Smoking status: Former Research scientist (life sciences)  . Smokeless tobacco: Never Used  Vaping Use  . Vaping Use: Never used  Substance and Sexual Activity  . Alcohol use: No  . Drug use: No  . Sexual activity: Not on file  Other Topics Concern  . Not on file  Social History Narrative   ** Merged History Encounter **       ** Merged History Encounter **       Social Determinants of Health   Financial Resource Strain:   . Difficulty of Paying Living Expenses: Not on file  Food Insecurity:   . Worried About Charity fundraiser in the Last Year: Not on file  . Ran Out of Food in the Last Year: Not on file  Transportation Needs:   . Lack of Transportation (Medical): Not on file  . Lack of Transportation (Non-Medical): Not on file  Physical Activity:   . Days of Exercise per Week: Not on file  . Minutes of Exercise per Session: Not on file  Stress:   . Feeling of  Stress : Not on file  Social Connections:   . Frequency of Communication with Friends and Family: Not on file  . Frequency of Social Gatherings with Friends and Family: Not on file  . Attends Religious Services: Not on file  . Active Member of Clubs or Organizations: Not on file  . Attends Archivist Meetings: Not on file  . Marital Status: Not on file    reports that she has quit smoking. She has never used smokeless tobacco. She reports that she does not drink alcohol and does not use drugs.  Functional Status Survey:    History reviewed. No pertinent family history.  Health Maintenance  Topic Date Due  . Hepatitis C Screening  Never done  . TETANUS/TDAP  Never done  . INFLUENZA VACCINE  03/22/2020  . DEXA SCAN  Completed  . COVID-19 Vaccine  Completed  . PNA vac Low Risk Adult  Completed    Allergies  Allergen Reactions  . Morphine Anaphylaxis  . Morphine And Related Anaphylaxis    Vitals drop down and pass out  . Morphine And Related Other (See Comments)     "vitals go down", pass out  . Sulfa Antibiotics Rash  . Sulfa Antibiotics Rash    Outpatient Encounter Medications as of 04/24/2020  Medication Sig  . conjugated estrogens (PREMARIN) vaginal cream Place 1 Applicatorful vaginally every other day for 14 days.  . [employer] VITAMIN B12 1000 MCG tablet Take 1,000 mcg by mouth daily.  . furosemide (LASIX) 20 MG tablet Take 20 mg by mouth.  . levocetirizine (XYZAL) 5 MG tablet Take 5 mg by mouth daily.  Marland Kitchen losartan (COZAAR) 25 MG tablet Take 25 mg by mouth daily.  . Magnesium Hydroxide (BL MILK OF MAGNESIA PO) Take by mouth. If no BM in 3 day, give 30 cc Milk of magnesium p.o. x 1 dose in 24 hours as needed  . metoprolol succinate (TOPROL-XL) 25 MG 24 hr tablet Take 12.5 mg by mouth at bedtime.   Marland Kitchen oxybutynin (DITROPAN XL) 5 MG 24 hr tablet Take 1 tablet (5 mg total) by mouth at bedtime.  . simvastatin (ZOCOR) 20 MG tablet Take 20 mg by mouth at bedtime.  . timolol (TIMOPTIC) 0.5 % ophthalmic solution Place 1 drop into both eyes at bedtime.  . vitamin B-12 1000 MCG tablet Take 1 tablet (1,000 mcg total) by mouth daily.  Marland Kitchen warfarin (COUMADIN) 5 MG tablet Take 1 tablet (5 mg total) by mouth daily for 2 days.  Marland Kitchen zolpidem (AMBIEN) 5 MG tablet Take 1 tablet (5 mg total) by mouth at bedtime as needed for sleep.  . metoprolol succinate (TOPROL XL) 25 MG 24 hr tablet Take 0.5 tablets (12.5 mg total) by mouth at bedtime.  . [DISCONTINUED] albuterol (VENTOLIN HFA) 108 (90 Base) MCG/ACT inhaler Inhale 1-2 puffs into the lungs every 6 (six) hours as needed for wheezing or shortness of breath.   . [DISCONTINUED] calcium carbonate (TUMS - DOSED IN MG ELEMENTAL CALCIUM) 500 MG chewable tablet Chew 1 tablet (200 mg of elemental calcium total) by mouth 3 (three) times daily as needed for indigestion or heartburn.  . [DISCONTINUED] Cholecalciferol (VITAMIN D3) 1000 units CAPS Take 1,000 Units by mouth daily.  . [DISCONTINUED] conjugated estrogens (PREMARIN) vaginal cream  Place 0.5 Applicatorfuls vaginally 2 (two) times a week.   . [DISCONTINUED] esomeprazole (NEXIUM) 20 MG capsule Take 20 mg by mouth daily as needed (Indigestion).   . [DISCONTINUED] fluticasone (FLONASE) 50 MCG/ACT nasal spray Place  1 spray into both nostrils daily as needed for allergies.   . [DISCONTINUED] furosemide (LASIX) 40 MG tablet TAKE 1 TABLET EVERY DAY (Patient taking differently: Take 20 mg by mouth daily. )  . [DISCONTINUED] hydrocortisone (ANUSOL-HC) 2.5 % rectal cream Apply rectally 2 times daily (Patient taking differently: Place 1 application rectally 2 (two) times daily as needed for hemorrhoids. )  . [DISCONTINUED] levocetirizine (XYZAL) 5 MG tablet Take 5 mg by mouth every evening.   . [DISCONTINUED] lidocaine (LIDODERM) 5 % Place 1 patch onto the skin daily. Remove & Discard patch within 12 hours or as directed by MD (Patient taking differently: Place 1 patch onto the skin daily as needed (pain). Remove & Discard patch within 12 hours or as directed by MD)  . [DISCONTINUED] losartan (COZAAR) 50 MG tablet TAKE 1 TABLET DAILY. PLEASE CALL TO SCHEDULE APPOINTMENT FOR FURTHER REFILLS. (Patient taking differently: Take 50 mg by mouth daily. )  . [DISCONTINUED] simvastatin (ZOCOR) 20 MG tablet Take 20 mg by mouth at bedtime.  . [DISCONTINUED] timolol (TIMOPTIC) 0.5 % ophthalmic solution Place 1 drop into both eyes in the morning and at bedtime.  . [DISCONTINUED] traMADol (ULTRAM) 50 MG tablet Take 50 mg by mouth daily as needed for moderate pain.   . [DISCONTINUED] warfarin (COUMADIN) 5 MG tablet Take 2.5-5 mg by mouth See admin instructions. Take 2.78m Sunday, Tuesday, Wednesday Thursday and Saturday then take 5 mg on Monday and Friday  . [DISCONTINUED] zolpidem (AMBIEN) 5 MG tablet Take 2.5 mg by mouth at bedtime.   No facility-administered encounter medications on file as of 04/24/2020.    Review of Systems  Constitutional: Positive for malaise/fatigue. Negative for chills and  fever.       Weight gain--had been on prednisone for quite a while for her RA  HENT: Negative for congestion and sore throat.   Eyes: Negative for blurred vision.  Respiratory: Positive for shortness of breath.        Chest tightness and dyspnea depending on position   Cardiovascular: Positive for chest pain. Negative for palpitations and leg swelling.  Gastrointestinal: Negative for abdominal pain, constipation and heartburn.  Genitourinary: Positive for frequency and urgency. Negative for dysuria.       OAB, atrophic vaginitis  Musculoskeletal: Positive for falls, joint pain and myalgias.       Increased pain in knees since fall that took her to hospital last month  Skin: Negative for itching and rash.  Neurological: Positive for weakness. Negative for dizziness and loss of consciousness.  Endo/Heme/Allergies: Bruises/bleeds easily.  Psychiatric/Behavioral: Negative for depression and memory loss. The patient is not nervous/anxious and does not have insomnia.     Vitals:   04/24/20 1508  BP: 111/75  Pulse: 76  Temp: (!) 97.3 F (36.3 C)  Weight: 230 lb (104.3 kg)  Height: 5' 1"  (1.549 m)   Body mass index is 43.46 kg/m. Physical Exam Vitals reviewed.  Constitutional:      General: She is not in acute distress.    Appearance: Normal appearance. She is obese. She is not toxic-appearing.  HENT:     Head: Normocephalic and atraumatic.     Right Ear: External ear normal.     Left Ear: External ear normal.     Nose: Nose normal.     Mouth/Throat:     Pharynx: Oropharynx is clear.  Eyes:     Extraocular Movements: Extraocular movements intact.     Pupils: Pupils are equal, round, and reactive to  light.  Cardiovascular:     Rate and Rhythm: Rhythm irregular.     Heart sounds: No murmur heard.   Pulmonary:     Effort: Pulmonary effort is normal.     Comments: Rhonchi right upper lung field, diminished at base Abdominal:     General: Bowel sounds are normal. There is no  distension.     Palpations: Abdomen is soft.     Tenderness: There is no abdominal tenderness. There is no guarding or rebound.  Musculoskeletal:        General: Normal range of motion.     Cervical back: Neck supple.     Right lower leg: No edema.     Left lower leg: No edema.  Skin:    General: Skin is warm and dry.     Coloration: Skin is pale.  Neurological:     General: No focal deficit present.     Mental Status: She is alert and oriented to person, place, and time.     Motor: Weakness present.     Gait: Gait abnormal.  Psychiatric:        Mood and Affect: Mood normal.     Comments: Is down     Labs reviewed: Basic Metabolic Panel: Recent Labs    02/15/20 0121 02/16/20 0431 04/18/20 0239 04/18/20 0239 04/19/20 0801 04/19/20 0801 04/20/20 0807 04/21/20 0727 04/23/20 0725  NA 138   < > 137   < > 137   < > 138 137 138  K 3.5   < > 4.8   < > 3.6   < > 3.4* 3.7 4.6  CL 102   < > 106   < > 104   < > 103 101 102  CO2 23   < > 24   < > 22   < > 23 25 23   GLUCOSE 99   < > 89   < > 88   < > 90 103* 99  BUN 12   < > 10   < > 11   < > 10 12 17   CREATININE 0.82   < > 0.85   < > 0.77   < > 0.80 0.86 0.95  CALCIUM 9.0   < > 8.6*   < > 8.6*   < > 8.7* 9.3 9.3  MG 1.9   < > 2.2  --  1.8  --  1.8  --   --   PHOS 3.0  --   --   --   --   --   --   --   --    < > = values in this interval not displayed.   Liver Function Tests: Recent Labs    02/19/20 0432 03/11/20 2251 04/16/20 0715  AST 31 27 49*  ALT 38 25 26  ALKPHOS 68 81 99  BILITOT 0.7 1.2 1.2  PROT 5.5* 6.0* 7.0  ALBUMIN 2.7* 2.9* 3.4*   No results for input(s): LIPASE, AMYLASE in the last 8760 hours. No results for input(s): AMMONIA in the last 8760 hours. CBC: Recent Labs    02/19/20 0432 02/27/20 1506 03/11/20 2251 04/16/20 0715 04/18/20 0239 04/20/20 0807 04/22/20 1850  WBC   < > 5.2 7.3   < > 5.9 5.4 8.7  NEUTROABS  --  2.9 5.1  --   --  2.8  --   HGB   < > 14.4 13.5   < > 12.4 13.6 14.0  HCT    < >  43.3 42.2   < > 37.8 41.9 43.3  MCV   < > 95.2 95.5   < > 95.7 93.9 95.0  PLT   < > 165 172   < > 182 176 187   < > = values in this interval not displayed.   Cardiac Enzymes: Recent Labs    04/16/20 0715 04/18/20 0239 04/20/20 0807  CKTOTAL 1,732* 1,835* 255*   BNP: Invalid input(s): POCBNP Lab Results  Component Value Date   HGBA1C 5.7 (H) 02/15/2020   Lab Results  Component Value Date   TSH 2.458 02/15/2020   Lab Results  Component Value Date   NIDPOEUM35 361 04/16/2020   No results found for: FOLATE No results found for: IRON, TIBC, FERRITIN  Imaging and Procedures obtained prior to SNF admission: Reviewed in epic--must not be pulling in due to her chart being merged.  Assessment/Plan 1. Small cell lung cancer, right upper lobe ([place]) -per pathology report from latest biopsy, but I did not review this with her today specifically--she knows she has a type of lung cancer and that there is a plan for her to get chemo and radiation possibly through Dr. Julien Nordmann -she hopes she can be treated -we discussed a palliative care consult here to further address her goals of care (in view of her already not the best performance status especially since her fall in the bathroom, poor mobility) -she is agreeable to this -f/u with Dr Julien Nordmann 9/13 with labs before  2. 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 Generalized weakness -here for PT, OT  4. Permanent atrial fibrillation (HCC)/hypercoagulable state due to afib [facility]) -cont toprol xl for rate control and warfarin 72m daily through the weekend for anticoagulation -then recheck INR Tuesday 9/7--may need to return to home dosing listed above  5. Rheumatoid arthritis, involving unspecified site, unspecified whether rheumatoid factor present ([place] -was on prednisone for quite a while thru Dr.  HTrudie Amaris Garrette-not currently on any tx that I see  6. Primary osteoarthritis of both knees -will add voltaren gel for her knees and ankles  7. Overactive bladder -on oxybutynin as begun in hospital, monitor for anticholinergic side effects  8. Atrophic vaginitis -cont vaginal estrogen cream  9. Primary insomnia -cont prn aLorrin Mais(she says she cannot rest at night wearing the oxygen b/c the concentrator is so loud and the ambien helps)  10. Full code status - Full code  Family/ staff Communication: discussed with snf nurse  Labs/tests ordered: cbc, bmp, mg 1 wk, INR done today, recheck on 9/7  Vester Titsworth L. Kathryn Cosby, D.O. [facility] [address] [address]Cell Phone (Mon-Fri 8am-5pm):  [phone] Call:  [phone]& follow prompts after 5pm & weekends Office Phone:  [phone] Fax:  [phone]

## 2020-04-29 ENCOUNTER — Telehealth: Payer: Self-pay

## 2020-04-29 NOTE — Telephone Encounter (Signed)
Called and spoke w/rn at [facility] and they thought the pt was still in the hospital. They are calling the main office to make sure that someone is still seeing her since her discharge on 04/23/20 from the hospital since she had suffered a fall. Will await a phone call from the [facility] rn

## 2020-04-29 NOTE — Telephone Encounter (Signed)
The rn returned a call the me and stated that the pt is at [facility] and being managed there until they resume care

## 2020-04-30 ENCOUNTER — Other Ambulatory Visit: Payer: Self-pay | Admitting: *Deleted

## 2020-04-30 LAB — CBC AND DIFFERENTIAL
HCT: 47 — AB (ref 36–46)
Hemoglobin: 15.4 (ref 12.0–16.0)
Neutrophils Absolute: 4
Platelets: 121 — AB (ref 150–399)
WBC: 7

## 2020-04-30 LAB — CBC: RBC: 4.92 (ref 3.87–5.11)

## 2020-04-30 LAB — BASIC METABOLIC PANEL
BUN: 19 (ref 4–21)
CO2: 25 — AB (ref 13–22)
Chloride: 98 — AB (ref 99–108)
Creatinine: 0.6 (ref 0.5–1.1)
Glucose: 93
Potassium: 3.5 (ref 3.4–5.3)
Sodium: 136 — AB (ref 137–147)

## 2020-04-30 LAB — COMPREHENSIVE METABOLIC PANEL
Calcium: 9.1 (ref 8.7–10.7)
GFR calc Af Amer: 90
GFR calc non Af Amer: 88.04

## 2020-04-30 NOTE — Progress Notes (Signed)
The proposed treatment discussed in cancer conference is for discussion purpose only and is not a binding recommendation. The patient was not physically examined nor present for their treatment options. Therefore, final treatment plans cannot be decided.  ?

## 2020-05-04 ENCOUNTER — Other Ambulatory Visit: Payer: Self-pay | Admitting: Physician Assistant

## 2020-05-04 ENCOUNTER — Inpatient Hospital Stay: Payer: Medicare HMO

## 2020-05-04 ENCOUNTER — Encounter: Payer: Self-pay | Admitting: Internal Medicine

## 2020-05-04 ENCOUNTER — Other Ambulatory Visit: Payer: Self-pay

## 2020-05-04 ENCOUNTER — Inpatient Hospital Stay: Payer: Medicare HMO | Attending: Internal Medicine | Admitting: Internal Medicine

## 2020-05-04 VITALS — BP 115/85 | HR 84 | Temp 98.2°F | Resp 18 | Ht 61.0 in

## 2020-05-04 DIAGNOSIS — C3491 Malignant neoplasm of unspecified part of right bronchus or lung: Secondary | ICD-10-CM | POA: Diagnosis not present

## 2020-05-04 DIAGNOSIS — C3411 Malignant neoplasm of upper lobe, right bronchus or lung: Secondary | ICD-10-CM

## 2020-05-04 DIAGNOSIS — Z87891 Personal history of nicotine dependence: Secondary | ICD-10-CM | POA: Diagnosis not present

## 2020-05-04 DIAGNOSIS — Z5111 Encounter for antineoplastic chemotherapy: Secondary | ICD-10-CM | POA: Insufficient documentation

## 2020-05-04 DIAGNOSIS — I10 Essential (primary) hypertension: Secondary | ICD-10-CM

## 2020-05-04 DIAGNOSIS — Z7189 Other specified counseling: Secondary | ICD-10-CM | POA: Insufficient documentation

## 2020-05-04 LAB — CMP (CANCER CENTER ONLY)
ALT: 23 U/L (ref 0–44)
AST: 24 U/L (ref 15–41)
Albumin: 3 g/dL — ABNORMAL LOW (ref 3.5–5.0)
Alkaline Phosphatase: 93 U/L (ref 38–126)
Anion gap: 7 (ref 5–15)
BUN: 13 mg/dL (ref 8–23)
CO2: 27 mmol/L (ref 22–32)
Calcium: 9.8 mg/dL (ref 8.9–10.3)
Chloride: 99 mmol/L (ref 98–111)
Creatinine: 0.72 mg/dL (ref 0.44–1.00)
GFR, Est AFR Am: >60 mL/min (ref >60)
GFR, Estimated: >60 mL/min (ref >60)
Glucose, Bld: 109 mg/dL — ABNORMAL HIGH (ref 70–99)
Potassium: 3.3 mmol/L — ABNORMAL LOW (ref 3.5–5.1)
Sodium: 133 mmol/L — ABNORMAL LOW (ref 135–145)
Total Bilirubin: 0.8 mg/dL (ref 0.3–1.2)
Total Protein: 6.6 g/dL (ref 6.5–8.1)

## 2020-05-04 LAB — CBC WITH DIFFERENTIAL (CANCER CENTER ONLY)
Abs Immature Granulocytes: 0.02 10*3/uL (ref 0.00–0.07)
Basophils Absolute: 0.1 10*3/uL (ref 0.0–0.1)
Basophils Relative: 1 %
Eosinophils Absolute: 0.3 10*3/uL (ref 0.0–0.5)
Eosinophils Relative: 4 %
HCT: 42.9 % (ref 36.0–46.0)
Hemoglobin: 14.4 g/dL (ref 12.0–15.0)
Immature Granulocytes: 0 %
Lymphocytes Relative: 18 %
Lymphs Abs: 1.2 10*3/uL (ref 0.7–4.0)
MCH: 31.2 pg (ref 26.0–34.0)
MCHC: 33.6 g/dL (ref 30.0–36.0)
MCV: 92.9 fL (ref 80.0–100.0)
Monocytes Absolute: 0.9 10*3/uL (ref 0.1–1.0)
Monocytes Relative: 13 %
Neutro Abs: 4.3 10*3/uL (ref 1.7–7.7)
Neutrophils Relative %: 64 %
Platelet Count: 176 10*3/uL (ref 150–400)
RBC: 4.62 MIL/uL (ref 3.87–5.11)
RDW: 14.7 % (ref 11.5–15.5)
WBC Count: 6.8 10*3/uL (ref 4.0–10.5)
nRBC: 0 % (ref 0.0–0.2)

## 2020-05-04 NOTE — Progress Notes (Signed)
START ON PATHWAY REGIMEN - Small Cell Lung     A cycle is every 21 days:     Carboplatin      Etoposide   **Always confirm dose/schedule in your pharmacy ordering system**  Patient Characteristics: Newly Diagnosed, Preoperative or Nonsurgical Candidate (Clinical Staging), First Line, Limited Stage, Nonsurgical Candidate Therapeutic Status: Newly Diagnosed, Preoperative or Nonsurgical Candidate (Clinical Staging) AJCC T Category: cT2a AJCC N Category: cN2 AJCC M Category: cM0 AJCC 8 Stage Grouping: IIIA Stage Classification: Limited Surgical Candidacy: Nonsurgical Candidate Intent of Therapy: Curative Intent, Discussed with Patient

## 2020-05-04 NOTE — Progress Notes (Signed)
[place] [phone]   [phone]  OFFICE PROGRESS NOTE  [name], MD [address] [place] [address]  DIAGNOSIS: Limited stage, stage IIIb (T2 a, N2, M0) presented with right upper lobe lung mass in addition to mediastinal lymphadenopathy diagnosed in June 2021.  PRIOR THERAPY: None  CURRENT THERAPY: Systemic chemotherapy with carboplatin for AUC of 5 from day 1 and etoposide 100 mg/M2 on days 1, 2 and 3 with Neulasta support every 3 weeks.  First dose 05/11/2020.  INTERVAL HISTORY: Leah Velasquez 79 y.o. female returns to the clinic today for follow-up visit accompanied by her daughter.  Her other daughter was available by phone during the visit.  The patient is feeling fine today with no concerning complaints except for generalized weakness and fatigue.  She is currently a resident of the skilled nursing facility at [facility] facility.  She denied having any current chest pain, shortness of breath, cough or hemoptysis.  She denied having any fever or chills.  She has no nausea, vomiting, diarrhea or constipation.  She has some hoarseness of her voice.  The patient was seen by Dr. 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  She is here today for evaluation and recommendation regarding treatment of her condition.    MEDICAL HISTORY: Past Medical History:  Diagnosis Date  . A-fib ([place])   . Dysrhythmia   . GERD (gastroesophageal reflux disease)   . Hemorrhoids   . Hyperlipidemia   . Hypertension   . Lung cancer ([place]) 2000   RLL  . Obesity   . Persistent atrial fibrillation ([place])   . Rheumatoid arthritis ([facility])     ALLERGIES:  is allergic to morphine, morphine and related, morphine and related, sulfa antibiotics, and sulfa antibiotics.  MEDICATIONS:  Current Outpatient Medications  Medication Sig Dispense Refill  . conjugated  estrogens (PREMARIN) vaginal cream Place 1 Applicatorful vaginally every other day for 14 days. 14 g 0  . CVS VITAMIN B12 1000 MCG tablet Take 1,000 mcg by mouth daily.    . diclofenac Sodium (VOLTAREN) 1 % GEL Apply 4 g topically 4 (four) times daily. To bilateral knees and ankles 350 g 1  . furosemide (LASIX) 20 MG tablet Take 20 mg by mouth.    . levocetirizine (XYZAL) 5 MG tablet Take 5 mg by mouth daily.    Marland Kitchen losartan (COZAAR) 25 MG tablet Take 25 mg by mouth daily.    . Magnesium Hydroxide (BL MILK OF MAGNESIA PO) Take by mouth. If no BM in 3 day, give 30 cc Milk of magnesium p.o. x 1 dose in 24 hours as needed    . metoprolol succinate (TOPROL-XL) 25 MG 24 hr tablet Take 12.5 mg by mouth at bedtime.     Marland Kitchen oxybutynin (DITROPAN XL) 5 MG 24 hr tablet Take 1 tablet (5 mg total) by mouth at bedtime. 30 tablet 0  . simvastatin (ZOCOR) 20 MG tablet Take 20 mg by mouth at bedtime.    . timolol (TIMOPTIC) 0.5 % ophthalmic solution Place 1 drop into both eyes at bedtime.    . vitamin B-12 1000 MCG tablet Take 1 tablet (1,000 mcg total) by mouth daily. 30 tablet 0  . warfarin (COUMADIN) 5 MG tablet Take 1 tablet (5 mg total) by mouth daily for 2 days. 2 tablet 0  . zolpidem (AMBIEN) 5 MG tablet Take  1 tablet (5 mg total) by mouth at bedtime as needed for sleep. 30 tablet 0   No current facility-administered medications for this visit.    SURGICAL HISTORY:  Past Surgical History:  Procedure Laterality Date  . BREAST EXCISIONAL BIOPSY Left    x 2  . BREAST EXCISIONAL BIOPSY Right   . BRONCHIAL BIOPSY  02/18/2020   Procedure: BRONCHIAL BIOPSIES;  Surgeon: Collene Gobble, MD;  Location: [facility] ENDOSCOPY;  Service: Cardiopulmonary;;  . BRONCHIAL BRUSHINGS  02/18/2020   Procedure: BRONCHIAL BRUSHINGS;  Surgeon: Collene Gobble, MD;  Location: [place] [place];  Service: Cardiopulmonary;;  . BRONCHIAL NEEDLE ASPIRATION BIOPSY  02/18/2020   Procedure: BRONCHIAL NEEDLE ASPIRATION BIOPSIES;  Surgeon: Collene Gobble, MD;  Location: [place];  Service: Cardiopulmonary;;  . BRONCHIAL WASHINGS  02/18/2020   Procedure: BRONCHIAL WASHINGS;  Surgeon: Collene Gobble, MD;  Location: [place];  Service: Cardiopulmonary;;  . CARDIOVERSION N/A 01/09/2018   Procedure: CARDIOVERSION;  Surgeon: Josue Hector, MD;  Location: [facility] ENDOSCOPY;  Service: Cardiovascular;  Laterality: N/A;  . CARDIOVERSION N/A 03/01/2018   Procedure: CARDIOVERSION;  Surgeon: Sueanne Margarita, MD;  Location: [facility] ENDOSCOPY;  Service: Cardiovascular;  Laterality: N/A;  . ELECTROMAGNETIC NAVIGATION BROCHOSCOPY N/A 02/18/2020   Procedure: ELECTROMAGNETIC NAVIGATION BRONCHOSCOPY;  Surgeon: Collene Gobble, MD;  Location: [place];  Service: Cardiopulmonary;  Laterality: N/A;  . KNEE SURGERY    . LUNG LOBECTOMY Right 2000   RLL removed  . VIDEO BRONCHOSCOPY N/A 02/18/2020   Procedure: VIDEO BRONCHOSCOPY WITH FLUORO;  Surgeon: Collene Gobble, MD;  Location[facility] ENDOSCOPY;  Service: Cardiopulmonary;  Laterality: N/A;  . VIDEO BRONCHOSCOPY WITH ENDOBRONCHIAL ULTRASOUND N/A 04/22/2020   Procedure: VIDEO BRONCHOSCOPY WITH ENDOBRONCHIAL ULTRASOUND;  Surgeon: [name], MD;  Location: [place];  Service: Thoracic;  Laterality: N/A;    REVIEW OF SYSTEMS:  Constitutional: positive for fatigue Eyes: negative Ears, nose, mouth, throat, and face: negative Respiratory: positive for cough and dyspnea on exertion Cardiovascular: negative Gastrointestinal: negative Genitourinary:negative Integument/breast: negative Hematologic/lymphatic: negative Musculoskeletal:negative Neurological: negative Behavioral/Psych: negative Endocrine: negative Allergic/Immunologic: negative   PHYSICAL EXAMINATION: General appearance: alert, cooperative, fatigued and no distress Head: Normocephalic, without obvious abnormality, atraumatic Neck: no adenopathy, no JVD, supple, symmetrical, trachea midline and thyroid not enlarged, symmetric, no  tenderness/mass/nodules Lymph nodes: Cervical, supraclavicular, and axillary nodes normal. Resp: clear to auscultation bilaterally Back: symmetric, no curvature. ROM normal. No CVA tenderness. Cardio: regular rate and rhythm, S1, S2 normal, no murmur, click, rub or gallop GI: soft, non-tender; bowel sounds normal; no masses,  no organomegaly Extremities: extremities normal, atraumatic, no cyanosis or edema Neurologic: Alert and oriented X 3, normal strength and tone. Normal symmetric reflexes. Normal coordination and gait  ECOG PERFORMANCE STATUS: 1 - Symptomatic but completely ambulatory  Blood pressure 115/85, pulse 84, temperature 98.2 F (36.8 C), temperature source Tympanic, resp. rate 18, height 5\' 1"  (1.549 m), SpO2 96 %.  LABORATORY DATA: Lab Results  Component Value Date   WBC 8.7 04/22/2020   HGB 14.0 04/22/2020   HCT 43.3 04/22/2020   MCV 95.0 04/22/2020   PLT 187 04/22/2020      Chemistry      Component Value Date/Time   NA 138 04/23/2020 0725   NA 138 10/02/2018 1545   K 4.6 04/23/2020 0725   CL 102 04/23/2020 0725   CO2 23 04/23/2020 0725   BUN 17 04/23/2020 0725   BUN 14 10/02/2018 1545   CREATININE 0.95 04/23/2020 0725  Component Value Date/Time   CALCIUM 9.3 04/23/2020 0725   ALKPHOS 99 04/16/2020 0715   AST 49 (H) 04/16/2020 0715   ALT 26 04/16/2020 0715   BILITOT 1.2 04/16/2020 0715       RADIOGRAPHIC STUDIES: DG Chest 2 View  Result Date: 04/16/2020 CLINICAL DATA:  [place] last night.  Low back pain. EXAM: CHEST - 2 VIEW COMPARISON:  Multiple priors, including chest radiograph from 02/20/2020 and PET-CT from 03/10/2020 FINDINGS: Prominence of the right paratracheal stripe with outwardly convex contour, likely secondary to known paramediastinal and right upper lobe lesions, better characterized on prior PET-CT. New diffuse interstitial prominence. Low lung volumes without focal consolidation. No discernible pneumothorax. No definite pleural  effusions. Mild-to-moderate enlargement of the cardiopericardial silhouette, increased from prior. Polyarticular degenerative change. Calcific atherosclerosis of the aorta. Multiple remote right rib fractures without evidence of acute osseous abnormality. IMPRESSION: 1. Cardiomegaly with new diffuse interstitial prominence, suggestive of mild interstitial pulmonary edema. 2. Prominence of the right paratracheal stripe, likely secondary to known paramediastinal and right upper lobe lesions, better characterized on prior PET-CT. Electronically Signed   By: Margaretha Sheffield MD   On: 04/16/2020 07:59   DG Lumbar Spine Complete  Result Date: 04/16/2020 CLINICAL DATA:  Left low back pain.  Fall. EXAM: LUMBAR SPINE - COMPLETE 4+ VIEW COMPARISON:  MRI lumbar spine 02/14/2020 FINDINGS: No definite vertebral body height loss when accounting for obliquity. Minimal anterolisthesis (grade 1) of L4 on L5 and L5 on S1, similar to prior. No new subluxation. Mild dextrocurvature. Mild-to-moderate degenerative changes, more pronounced in the lower lumbar spine and better characterized on prior MRI of the lumbar spine. Calcific aortic atherosclerosis. IMPRESSION: No radiographic evidence of acute fracture or traumatic malalignment. Electronically Signed   By: Margaretha Sheffield MD   On: 04/16/2020 08:04   DG C-ARM BRONCHOSCOPY  Result Date: 04/22/2020 C-ARM BRONCHOSCOPY: Fluoroscopy was utilized by the requesting physician.  No radiographic interpretation.    ASSESSMENT AND PLAN: This is a very pleasant 79 years old white female with a limited stage, stage IIIb (T2a, N2, M0) small cell lung cancer presented with right upper lobe lung mass in addition to mediastinal lymphadenopathy diagnosed in June 2021. I had a lengthy discussion with the patient and her daughters today about her current disease stage, prognosis and treatment options. I recommended for the patient treatment with systemic chemotherapy with carboplatin for  AUC of 5 on day 1 and 2 etoposide 100 mg/M2 on days 1, 2 and 3 with Neulasta support.  I would also recommend for the patient concurrent radiotherapy but she is too weak to come daily for treatment at this point and we may have to do it at a later time. I discussed with the patient the adverse effect of the chemotherapy including but not limited to alopecia, myelosuppression, nausea and vomiting, peripheral neuropathy, liver or renal dysfunction. The patient is expected to start the first dose of her treatment on 05/11/2020. I will arrange for the patient to have a chemotherapy education class before the first dose of her treatment. I will call her pharmacy with prescription for Compazine 10 mg p.o. every 6 hours as needed for nausea. The patient will come back for follow-up visit in 2 weeks for evaluation and management of any adverse effects of her treatment She was advised to call immediately if she has any concerning symptoms in the interval. The patient voices understanding of current disease status and treatment options and is in agreement with the current care  plan.  All questions were answered. The patient knows to call the clinic with any problems, questions or concerns. We can certainly see the patient much sooner if necessary.  The total time spent in the appointment was 50 minutes.  Disclaimer: This note was dictated with voice recognition software. Similar sounding words can inadvertently be transcribed and may not be corrected upon review.

## 2020-05-05 ENCOUNTER — Non-Acute Institutional Stay (SKILLED_NURSING_FACILITY): Payer: Medicare HMO | Admitting: Family

## 2020-05-05 ENCOUNTER — Encounter: Payer: Self-pay | Admitting: Family

## 2020-05-05 DIAGNOSIS — I4821 Permanent atrial fibrillation: Secondary | ICD-10-CM

## 2020-05-05 NOTE — Progress Notes (Signed)
Pharmacist Chemotherapy Monitoring - Initial Assessment    Anticipated start date: 05/11/20   Regimen:  . Are orders appropriate based on the patient's diagnosis, regimen, and cycle? Yes . Does the plan date match the patient's scheduled date? Yes . Is the sequencing of drugs appropriate? Yes . Are the premedications appropriate for the patient's regimen? Yes . Prior Authorization for treatment is: Pending - Fulphila o If applicable, is the correct biosimilar selected based on the patient's insurance? pending  Organ Function and Labs: Marland Kitchen Are dose adjustments needed based on the patient's renal function, hepatic function, or hematologic function? No . Are appropriate labs ordered prior to the start of patient's treatment? Yes . Other organ system assessment, if indicated: N/A . The following baseline labs, if indicated, have been ordered: N/A  Dose Assessment: . Are the drug doses appropriate? Yes . Are the following correct: o Drug concentrations Yes o IV fluid compatible with drug Yes o Administration routes Yes o Timing of therapy Yes . If applicable, does the patient have documented access for treatment and/or plans for port-a-cath placement? no . If applicable, have lifetime cumulative doses been properly documented and assessed? not applicable Lifetime Dose Tracking  No doses have been documented on this patient for the following tracked chemicals: Doxorubicin, Epirubicin, Idarubicin, Daunorubicin, Mitoxantrone, Bleomycin, Oxaliplatin, Carboplatin, Liposomal Doxorubicin  o   Toxicity Monitoring/Prevention: . The patient has the following take home antiemetics prescribed: Prochlorperazine . The patient has the following take home medications prescribed: N/A . Medication allergies and previous infusion related reactions, if applicable, have been reviewed and addressed. Yes . The patient's current medication list has been assessed for drug-drug interactions with their chemotherapy  regimen. no significant drug-drug interactions were identified on review.  Order Review: . Are the treatment plan orders signed? Yes . Is the patient scheduled to see a provider prior to their treatment? Yes  I verify that I have reviewed each item in the above checklist and answered each question accordingly.  Leah Velasquez 05/05/2020 5:10 PM

## 2020-05-05 NOTE — Progress Notes (Signed)
Location:  [facility] Room Number: 103-P Place of Service:  SNF (31) Provider:  Marlowe Sax, NP  Patient Care Team: [name], MD as PCP - General (Family Medicine) Lorretta Harp, MD as PCP - Cardiology (Cardiology) Gavin Pound, MD as Consulting Physician (Rheumatology)  Extended Emergency Contact Information Primary Emergency Contact: Molitor,James Address: [address]          [place], [place] [address] Johnnette Litter of [place] Phone: [phone] Relation: Spouse Secondary Emergency Contact: [name] Mobile Phone: [phone] Relation: Daughter  Code Status:  FULL CODE Goals of care: Advanced Directive information Advanced Directives 04/24/2020  Does Patient Have a Medical Advance Directive? No  Type of Advance Directive -  Copy of [place] in Chart? -  Would patient like information on creating a medical advance directive? No - Patient declined     Chief Complaint  Patient presents with  . Acute Visit    Patient seen for abnormal INR of 3.4    HPI:  Pt is a 79 y.o. female seen today for an acute visit for INR results.Facility Nurse reports INR 3.4 today previous INR 3.6 with goal 2-3 for Afib.currently on coumadin 2.5 mg tablet on Tuesday,Wednesday,Thursday,saturday and Sunday and Coumadin 5 mg tablet daily on Monday  and Friday.she denies any signs of bleeding.also denies any symptoms of acute illness.she states continue to work with therapy but still unable to walk well.she is concerned about paying for her stay at rehab.I've advised her to discuss her stay with facility social worker.    Past Medical History:  Diagnosis Date  . A-fib ([place])   . Dysrhythmia   . GERD (gastroesophageal reflux disease)   . Hemorrhoids   . Hyperlipidemia   . Hypertension   . Lung cancer ([place]) 2000   RLL  . Obesity   . Persistent atrial fibrillation ([place])   . Rheumatoid arthritis [facility])    Past  Surgical History:  Procedure Laterality Date  . BREAST EXCISIONAL BIOPSY Left    x 2  . BREAST EXCISIONAL BIOPSY Right   . BRONCHIAL BIOPSY  02/18/2020   Procedure: BRONCHIAL BIOPSIES;  Surgeon: Collene Gobble, MD;  Location: [facility] ENDOSCOPY;  Service: Cardiopulmonary;;  . BRONCHIAL BRUSHINGS  02/18/2020   Procedure: BRONCHIAL BRUSHINGS;  Surgeon: Collene Gobble, MD;  Location: [place];  Service: Cardiopulmonary;;  . BRONCHIAL NEEDLE ASPIRATION BIOPSY  02/18/2020   Procedure: BRONCHIAL NEEDLE ASPIRATION BIOPSIES;  Surgeon: Collene Gobble, MD;  Location: [facility];  Service: Cardiopulmonary;;  . BRONCHIAL WASHINGS  02/18/2020   Procedure: BRONCHIAL WASHINGS;  Surgeon: Collene Gobble, MD;  Location: [facility];  Service: Cardiopulmonary;;  . CARDIOVERSION N/A 01/09/2018   Procedure: CARDIOVERSION;  Surgeon: Josue Hector, MD;  Location: [facility] ENDOSCOPY;  Service: Cardiovascular;  Laterality: N/A;  . CARDIOVERSION N/A 03/01/2018   Procedure: CARDIOVERSION;  Surgeon: Sueanne Margarita, MD;  Location: [facility] ENDOSCOPY;  Service: Cardiovascular;  Laterality: N/A;  . ELECTROMAGNETIC NAVIGATION BROCHOSCOPY N/A 02/18/2020   Procedure: ELECTROMAGNETIC NAVIGATION BRONCHOSCOPY;  Surgeon: Collene Gobble, MD;  Location: [facility];  Service: Cardiopulmonary;  Laterality: N/A;  . KNEE SURGERY    . LUNG LOBECTOMY Right 2000   RLL removed  . VIDEO BRONCHOSCOPY N/A 02/18/2020   Procedure: VIDEO BRONCHOSCOPY WITH FLUORO;  Surgeon: Collene Gobble, MD;  Location: [name] ENDOSCOPY;  Service: Cardiopulmonary;  Laterality: N/A;  . VIDEO BRONCHOSCOPY WITH ENDOBRONCHIAL ULTRASOUND N/A 04/22/2020   Procedure: VIDEO  BRONCHOSCOPY WITH ENDOBRONCHIAL ULTRASOUND;  Surgeon: [name], MD;  Location: [facility] OR;  Service: Thoracic;  Laterality: N/A;    Allergies  Allergen Reactions  . Morphine Anaphylaxis  . Morphine And Related Anaphylaxis    Vitals drop down and pass out  . Morphine And Related Other (See  Comments)    "vitals go down", pass out  . Sulfa Antibiotics Rash  . Sulfa Antibiotics Rash    Outpatient Encounter Medications as of 05/05/2020  Medication Sig  . conjugated estrogens (PREMARIN) vaginal cream Place 1 Applicatorful vaginally every other day for 14 days.  . CVS VITAMIN B12 1000 MCG tablet Take 1,000 mcg by mouth daily.  . diclofenac Sodium (VOLTAREN) 1 % GEL Apply 4 g topically 4 (four) times daily. To bilateral knees and ankles  . furosemide (LASIX) 20 MG tablet Take 20 mg by mouth.  . levocetirizine (XYZAL) 5 MG tablet Take 5 mg by mouth daily.  Marland Kitchen losartan (COZAAR) 25 MG tablet Take 25 mg by mouth daily.  . Magnesium Hydroxide (BL MILK OF MAGNESIA PO) Take by mouth. If no BM in 3 day, give 30 cc Milk of magnesium p.o. x 1 dose in 24 hours as needed  . metoprolol succinate (TOPROL-XL) 25 MG 24 hr tablet Take 12.5 mg by mouth at bedtime.   Marland Kitchen oxybutynin (DITROPAN XL) 5 MG 24 hr tablet Take 1 tablet (5 mg total) by mouth at bedtime.  . simvastatin (ZOCOR) 20 MG tablet Take 20 mg by mouth at bedtime.  . timolol (TIMOPTIC) 0.5 % ophthalmic solution Place 1 drop into both eyes at bedtime.  . vitamin B-12 1000 MCG tablet Take 1 tablet (1,000 mcg total) by mouth daily.  Marland Kitchen warfarin (COUMADIN) 2.5 MG tablet Take 2.5 mg by mouth See admin instructions. On Tue-Wed-Thu-Sat-Sun  . warfarin (COUMADIN) 5 MG tablet Take 5 mg by mouth See admin instructions. Monday and Friday  . zolpidem (AMBIEN) 5 MG tablet Take 1 tablet (5 mg total) by mouth at bedtime as needed for sleep.  . [DISCONTINUED] warfarin (COUMADIN) 5 MG tablet Take 1 tablet (5 mg total) by mouth daily for 2 days.   No facility-administered encounter medications on file as of 05/05/2020.    Review of Systems  Constitutional: Negative for appetite change, chills, fatigue and fever.  HENT: Negative for congestion, nosebleeds, postnasal drip, rhinorrhea, sinus pressure, sinus pain and sneezing.   Respiratory: Negative for chest  tightness, shortness of breath and wheezing.   Cardiovascular: Negative for chest pain, palpitations and leg swelling.  Skin: Negative for color change, pallor and rash.  Neurological: Negative for dizziness, light-headedness and headaches.  Hematological: Does not bruise/bleed easily.    Immunization History  Administered Date(s) Administered  . Influenza, High Dose Seasonal PF 06/27/2016, 05/23/2017, 06/08/2018  . Moderna SARS-COVID-2 Vaccination 11/12/2019, 12/10/2019  . Pneumococcal Conjugate-13 01/04/2017  . Pneumococcal Polysaccharide-23 01/17/2018   Pertinent  Health Maintenance Due  Topic Date Due  . INFLUENZA VACCINE  03/22/2020  . DEXA SCAN  Completed  . PNA vac Low Risk Adult  Completed   No flowsheet data found. Functional Status Survey:    Vitals:   05/05/20 1557  BP: 135/79  Pulse: 88  Resp: 18  Temp: 98 F (36.7 C)  TempSrc: Oral  Weight: 228 lb 9.6 oz (103.7 kg)  Height: 5\' 1"  (1.549 m)   Body mass index is 43.19 kg/m. Physical Exam Vitals reviewed.  Constitutional:      General: She is not in acute distress.  Appearance: She is morbidly obese. She is not ill-appearing.  HENT:     Nose: Nose normal. No congestion or rhinorrhea.     Mouth/Throat:     Mouth: Mucous membranes are moist.     Pharynx: Oropharynx is clear. No oropharyngeal exudate.  Eyes:     General: No scleral icterus.       Right eye: No discharge.        Left eye: No discharge.     Conjunctiva/sclera: Conjunctivae normal.     Pupils: Pupils are equal, round, and reactive to light.  Cardiovascular:     Rate and Rhythm: Normal rate. Rhythm irregular.     Pulses: Normal pulses.     Heart sounds: Normal heart sounds. No murmur heard.  No friction rub. No gallop.   Pulmonary:     Effort: Pulmonary effort is normal. No respiratory distress.     Breath sounds: Normal breath sounds. No wheezing, rhonchi or rales.  Chest:     Chest wall: No tenderness.  Abdominal:     General:  Bowel sounds are normal. There is no distension.     Palpations: Abdomen is soft. There is no mass.     Tenderness: There is no abdominal tenderness. There is no right CVA tenderness, left CVA tenderness, guarding or rebound.  Musculoskeletal:        General: No swelling or tenderness.     Right lower leg: No edema.     Left lower leg: No edema.     Comments: Unsteady gait  Skin:    General: Skin is warm.     Coloration: Skin is pale.     Findings: Rash present. No bruising or erythema.  Neurological:     Mental Status: She is alert and oriented to person, place, and time.     Cranial Nerves: No cranial nerve deficit.     Sensory: No sensory deficit.     Motor: No weakness.     Gait: Gait abnormal.  Psychiatric:        Mood and Affect: Mood normal.        Behavior: Behavior normal.        Thought Content: Thought content normal.        Judgment: Judgment normal.     Labs reviewed: Recent Labs    02/15/20 0121 02/16/20 0431 04/18/20 0239 04/18/20 0239 04/19/20 0801 04/19/20 0801 04/20/20 2778 04/20/20 0807 04/21/20 0727 04/23/20 0725 05/04/20 1328  NA 138   < > 137   < > 137   < > 138   < > 137 138 133*  K 3.5   < > 4.8   < > 3.6   < > 3.4*   < > 3.7 4.6 3.3*  CL 102   < > 106   < > 104   < > 103   < > 101 102 99  CO2 23   < > 24   < > 22   < > 23   < > 25 23 27   GLUCOSE 99   < > 89   < > 88   < > 90   < > 103* 99 109*  BUN 12   < > 10   < > 11   < > 10   < > 12 17 13   CREATININE 0.82   < > 0.85   < > 0.77   < > 0.80   < > 0.86 0.95 0.72  CALCIUM 9.0   < >  8.6*   < > 8.6*   < > 8.7*   < > 9.3 9.3 9.8  MG 1.9   < > 2.2  --  1.8  --  1.8  --   --   --   --   PHOS 3.0  --   --   --   --   --   --   --   --   --   --    < > = values in this interval not displayed.   Recent Labs    03/11/20 2251 04/16/20 0715 05/04/20 1328  AST 27 49* 24  ALT 25 26 23   ALKPHOS 81 99 93  BILITOT 1.2 1.2 0.8  PROT 6.0* 7.0 6.6  ALBUMIN 2.9* 3.4* 3.0*   Recent Labs     03/11/20 2251 04/16/20 0715 04/20/20 0807 04/22/20 1850 05/04/20 1328  WBC 7.3   < > 5.4 8.7 6.8  NEUTROABS 5.1  --  2.8  --  4.3  HGB 13.5   < > 13.6 14.0 14.4  HCT 42.2   < > 41.9 43.3 42.9  MCV 95.5   < > 93.9 95.0 92.9  PLT 172   < > 176 187 176   < > = values in this interval not displayed.   Lab Results  Component Value Date   TSH 2.458 02/15/2020   Lab Results  Component Value Date   HGBA1C 5.7 (H) 02/15/2020   No results found for: CHOL, HDL, LDLCALC, LDLDIRECT, TRIG, CHOLHDL  Significant Diagnostic Results in last 30 days:  DG Chest 2 View  Result Date: 04/16/2020 CLINICAL DATA:  [place] last night.  Low back pain. EXAM: CHEST - 2 VIEW COMPARISON:  Multiple priors, including chest radiograph from 02/20/2020 and PET-CT from 03/10/2020 FINDINGS: Prominence of the right paratracheal stripe with outwardly convex contour, likely secondary to known paramediastinal and right upper lobe lesions, better characterized on prior PET-CT. New diffuse interstitial prominence. Low lung volumes without focal consolidation. No discernible pneumothorax. No definite pleural effusions. Mild-to-moderate enlargement of the cardiopericardial silhouette, increased from prior. Polyarticular degenerative change. Calcific atherosclerosis of the aorta. Multiple remote right rib fractures without evidence of acute osseous abnormality. IMPRESSION: 1. Cardiomegaly with new diffuse interstitial prominence, suggestive of mild interstitial pulmonary edema. 2. Prominence of the right paratracheal stripe, likely secondary to known paramediastinal and right upper lobe lesions, better characterized on prior PET-CT. Electronically Signed   By: Margaretha Sheffield MD   On: 04/16/2020 07:59   DG Lumbar Spine Complete  Result Date: 04/16/2020 CLINICAL DATA:  Left low back pain.  Fall. EXAM: LUMBAR SPINE - COMPLETE 4+ VIEW COMPARISON:  MRI lumbar spine 02/14/2020 FINDINGS: No definite vertebral body height loss when  accounting for obliquity. Minimal anterolisthesis (grade 1) of L4 on L5 and L5 on S1, similar to prior. No new subluxation. Mild dextrocurvature. Mild-to-moderate degenerative changes, more pronounced in the lower lumbar spine and better characterized on prior MRI of the lumbar spine. Calcific aortic atherosclerosis. IMPRESSION: No radiographic evidence of acute fracture or traumatic malalignment. Electronically Signed   By: Margaretha Sheffield MD   On: 04/16/2020 08:04   DG C-ARM BRONCHOSCOPY  Result Date: 04/22/2020 C-ARM BRONCHOSCOPY: Fluoroscopy was utilized by the requesting physician.  No radiographic interpretation.    Assessment/Plan  Permanent atrial fibrillation (HCC) HR irregular  INR 3.4 goal 2-3 no signs of bleeding  - Hold coumadin 2.5 mg tablet today then restart coumadin 05/06/2020 and 5 mg tablet on Monday and  Friday. - Recheck INR 05/09/2020   Family/ staff Communication: Reviewed plan of care with patient and facility Nurse   Labs/tests ordered:  - Recheck INR 05/09/2020

## 2020-05-06 ENCOUNTER — Encounter: Payer: Self-pay | Admitting: *Deleted

## 2020-05-06 NOTE — Progress Notes (Signed)
I spoke with Ms. Happe in clinic this week.  I help to explain her treatment plan and gave her information on the medications she will receive.

## 2020-05-08 ENCOUNTER — Inpatient Hospital Stay: Payer: Medicare HMO

## 2020-05-08 ENCOUNTER — Other Ambulatory Visit: Payer: Self-pay | Admitting: *Deleted

## 2020-05-08 ENCOUNTER — Other Ambulatory Visit: Payer: Self-pay

## 2020-05-08 MED ORDER — PROCHLORPERAZINE MALEATE 10 MG PO TABS: ORAL_TABLET | ORAL | 99 refills | Status: DC

## 2020-05-08 MED ORDER — LORATADINE 10 MG PO TABS: ORAL_TABLET | ORAL | 1 refills | Status: DC

## 2020-05-11 ENCOUNTER — Other Ambulatory Visit: Payer: Self-pay

## 2020-05-11 ENCOUNTER — Inpatient Hospital Stay (HOSPITAL_COMMUNITY)
Admission: EM | Admit: 2020-05-11 | Discharge: 2020-05-13 | DRG: 641 | Disposition: A | Discharge status: Skilled Nursing Facility | Payer: Medicare HMO | Source: Skilled Nursing Facility | Attending: Internal Medicine | Admitting: Internal Medicine

## 2020-05-11 ENCOUNTER — Inpatient Hospital Stay: Payer: Medicare HMO

## 2020-05-11 ENCOUNTER — Ambulatory Visit (HOSPITAL_BASED_OUTPATIENT_CLINIC_OR_DEPARTMENT_OTHER): Payer: Medicare HMO | Admitting: Medical

## 2020-05-11 ENCOUNTER — Telehealth: Payer: Self-pay | Admitting: Internal Medicine

## 2020-05-11 ENCOUNTER — Encounter (HOSPITAL_COMMUNITY): Payer: Self-pay | Admitting: *Deleted

## 2020-05-11 ENCOUNTER — Emergency Department (HOSPITAL_COMMUNITY): Payer: Medicare HMO

## 2020-05-11 VITALS — BP 84/61 | HR 85 | Resp 16

## 2020-05-11 DIAGNOSIS — R296 Repeated falls: Secondary | ICD-10-CM | POA: Diagnosis present

## 2020-05-11 DIAGNOSIS — Z7901 Long term (current) use of anticoagulants: Secondary | ICD-10-CM

## 2020-05-11 DIAGNOSIS — R4182 Altered mental status, unspecified: Secondary | ICD-10-CM | POA: Diagnosis not present

## 2020-05-11 DIAGNOSIS — E785 Hyperlipidemia, unspecified: Secondary | ICD-10-CM | POA: Diagnosis present

## 2020-05-11 DIAGNOSIS — N390 Urinary tract infection, site not specified: Secondary | ICD-10-CM | POA: Diagnosis not present

## 2020-05-11 DIAGNOSIS — Z87898 Personal history of other specified conditions: Secondary | ICD-10-CM

## 2020-05-11 DIAGNOSIS — Z85118 Personal history of other malignant neoplasm of bronchus and lung: Secondary | ICD-10-CM

## 2020-05-11 DIAGNOSIS — M069 Rheumatoid arthritis, unspecified: Secondary | ICD-10-CM | POA: Diagnosis present

## 2020-05-11 DIAGNOSIS — E861 Hypovolemia: Secondary | ICD-10-CM | POA: Diagnosis not present

## 2020-05-11 DIAGNOSIS — I10 Essential (primary) hypertension: Secondary | ICD-10-CM | POA: Diagnosis present

## 2020-05-11 DIAGNOSIS — R410 Disorientation, unspecified: Secondary | ICD-10-CM

## 2020-05-11 DIAGNOSIS — Z20822 Contact with and (suspected) exposure to covid-19: Secondary | ICD-10-CM | POA: Diagnosis present

## 2020-05-11 DIAGNOSIS — R413 Other amnesia: Secondary | ICD-10-CM | POA: Diagnosis present

## 2020-05-11 DIAGNOSIS — N3000 Acute cystitis without hematuria: Secondary | ICD-10-CM | POA: Diagnosis present

## 2020-05-11 DIAGNOSIS — C3411 Malignant neoplasm of upper lobe, right bronchus or lung: Secondary | ICD-10-CM | POA: Diagnosis present

## 2020-05-11 DIAGNOSIS — I4819 Other persistent atrial fibrillation: Secondary | ICD-10-CM | POA: Diagnosis present

## 2020-05-11 DIAGNOSIS — Z885 Allergy status to narcotic agent status: Secondary | ICD-10-CM

## 2020-05-11 DIAGNOSIS — E669 Obesity, unspecified: Secondary | ICD-10-CM | POA: Diagnosis present

## 2020-05-11 DIAGNOSIS — R531 Weakness: Secondary | ICD-10-CM

## 2020-05-11 DIAGNOSIS — E871 Hypo-osmolality and hyponatremia: Secondary | ICD-10-CM | POA: Diagnosis present

## 2020-05-11 DIAGNOSIS — Z882 Allergy status to sulfonamides status: Secondary | ICD-10-CM

## 2020-05-11 DIAGNOSIS — I9589 Other hypotension: Secondary | ICD-10-CM | POA: Diagnosis not present

## 2020-05-11 DIAGNOSIS — Z6841 Body Mass Index (BMI) 40.0 and over, adult: Secondary | ICD-10-CM

## 2020-05-11 DIAGNOSIS — Z87891 Personal history of nicotine dependence: Secondary | ICD-10-CM

## 2020-05-11 DIAGNOSIS — K219 Gastro-esophageal reflux disease without esophagitis: Secondary | ICD-10-CM | POA: Diagnosis present

## 2020-05-11 DIAGNOSIS — Z79899 Other long term (current) drug therapy: Secondary | ICD-10-CM

## 2020-05-11 LAB — COMPREHENSIVE METABOLIC PANEL
ALT: 27 U/L (ref 0–44)
AST: 29 U/L (ref 15–41)
Albumin: 3.3 g/dL — ABNORMAL LOW (ref 3.5–5.0)
Alkaline Phosphatase: 78 U/L (ref 38–126)
Anion gap: 12 (ref 5–15)
BUN: 13 mg/dL (ref 8–23)
CO2: 26 mmol/L (ref 22–32)
Calcium: 9.1 mg/dL (ref 8.9–10.3)
Chloride: 95 mmol/L — ABNORMAL LOW (ref 98–111)
Creatinine, Ser: 0.77 mg/dL (ref 0.44–1.00)
GFR calc Af Amer: >60 mL/min (ref >60)
GFR calc non Af Amer: >60 mL/min (ref >60)
Glucose, Bld: 124 mg/dL — ABNORMAL HIGH (ref 70–99)
Potassium: 3.5 mmol/L (ref 3.5–5.1)
Sodium: 133 mmol/L — ABNORMAL LOW (ref 135–145)
Total Bilirubin: 1 mg/dL (ref 0.3–1.2)
Total Protein: 6.9 g/dL (ref 6.5–8.1)

## 2020-05-11 LAB — CBC WITH DIFFERENTIAL/PLATELET
Abs Immature Granulocytes: 0.04 10*3/uL (ref 0.00–0.07)
Basophils Absolute: 0.1 10*3/uL (ref 0.0–0.1)
Basophils Relative: 1 %
Eosinophils Absolute: 0.1 10*3/uL (ref 0.0–0.5)
Eosinophils Relative: 2 %
HCT: 44.9 % (ref 36.0–46.0)
Hemoglobin: 15 g/dL (ref 12.0–15.0)
Immature Granulocytes: 1 %
Lymphocytes Relative: 9 %
Lymphs Abs: 0.8 10*3/uL (ref 0.7–4.0)
MCH: 31.6 pg (ref 26.0–34.0)
MCHC: 33.4 g/dL (ref 30.0–36.0)
MCV: 94.7 fL (ref 80.0–100.0)
Monocytes Absolute: 0.5 10*3/uL (ref 0.1–1.0)
Monocytes Relative: 6 %
Neutro Abs: 6.9 10*3/uL (ref 1.7–7.7)
Neutrophils Relative %: 81 %
Platelets: 182 10*3/uL (ref 150–400)
RBC: 4.74 MIL/uL (ref 3.87–5.11)
RDW: 14.6 % (ref 11.5–15.5)
WBC: 8.4 10*3/uL (ref 4.0–10.5)
nRBC: 0 % (ref 0.0–0.2)

## 2020-05-11 LAB — CBC WITH DIFFERENTIAL (CANCER CENTER ONLY)
Abs Immature Granulocytes: 0.01 10*3/uL (ref 0.00–0.07)
Basophils Absolute: 0.1 10*3/uL (ref 0.0–0.1)
Basophils Relative: 1 %
Eosinophils Absolute: 0.3 10*3/uL (ref 0.0–0.5)
Eosinophils Relative: 5 %
HCT: 43.6 % (ref 36.0–46.0)
Hemoglobin: 14.7 g/dL (ref 12.0–15.0)
Immature Granulocytes: 0 %
Lymphocytes Relative: 24 %
Lymphs Abs: 1.4 10*3/uL (ref 0.7–4.0)
MCH: 31.2 pg (ref 26.0–34.0)
MCHC: 33.7 g/dL (ref 30.0–36.0)
MCV: 92.6 fL (ref 80.0–100.0)
Monocytes Absolute: 1 10*3/uL (ref 0.1–1.0)
Monocytes Relative: 17 %
Neutro Abs: 3 10*3/uL (ref 1.7–7.7)
Neutrophils Relative %: 53 %
Platelet Count: 182 10*3/uL (ref 150–400)
RBC: 4.71 MIL/uL (ref 3.87–5.11)
RDW: 14.4 % (ref 11.5–15.5)
WBC Count: 5.8 10*3/uL (ref 4.0–10.5)
nRBC: 0 % (ref 0.0–0.2)

## 2020-05-11 LAB — CMP (CANCER CENTER ONLY)
ALT: 25 U/L (ref 0–44)
AST: 27 U/L (ref 15–41)
Albumin: 2.9 g/dL — ABNORMAL LOW (ref 3.5–5.0)
Alkaline Phosphatase: 93 U/L (ref 38–126)
Anion gap: 8 (ref 5–15)
BUN: 11 mg/dL (ref 8–23)
CO2: 27 mmol/L (ref 22–32)
Calcium: 9.7 mg/dL (ref 8.9–10.3)
Chloride: 96 mmol/L — ABNORMAL LOW (ref 98–111)
Creatinine: 0.7 mg/dL (ref 0.44–1.00)
GFR, Est AFR Am: >60 mL/min (ref >60)
GFR, Estimated: >60 mL/min (ref >60)
Glucose, Bld: 114 mg/dL — ABNORMAL HIGH (ref 70–99)
Potassium: 3.4 mmol/L — ABNORMAL LOW (ref 3.5–5.1)
Sodium: 131 mmol/L — ABNORMAL LOW (ref 135–145)
Total Bilirubin: 0.9 mg/dL (ref 0.3–1.2)
Total Protein: 6.7 g/dL (ref 6.5–8.1)

## 2020-05-11 LAB — URINALYSIS, ROUTINE W REFLEX MICROSCOPIC
Bilirubin Urine: NEGATIVE
Glucose, UA: NEGATIVE mg/dL
Ketones, ur: NEGATIVE mg/dL
Nitrite: NEGATIVE
Protein, ur: 30 mg/dL — AB
Specific Gravity, Urine: 1.01 (ref 1.005–1.030)
WBC, UA: >50 WBC/hpf — ABNORMAL HIGH (ref 0–5)
pH: 7 (ref 5.0–8.0)

## 2020-05-11 LAB — SARS CORONAVIRUS 2 BY RT PCR (HOSPITAL ORDER, PERFORMED IN ~~LOC~~ HOSPITAL LAB): SARS Coronavirus 2: NEGATIVE

## 2020-05-11 LAB — RAPID URINE DRUG SCREEN, HOSP PERFORMED
Amphetamines: NOT DETECTED
Barbiturates: NOT DETECTED
Benzodiazepines: NOT DETECTED
Cocaine: NOT DETECTED
Opiates: NOT DETECTED
Tetrahydrocannabinol: NOT DETECTED

## 2020-05-11 LAB — PROTIME-INR
INR: 2.4 — ABNORMAL HIGH (ref 0.8–1.2)
Prothrombin Time: 25.4 seconds — ABNORMAL HIGH (ref 11.4–15.2)

## 2020-05-11 IMAGING — CT CT HEAD W/O CM
3 series · 15 of 47 positions shown, 18 images · non-contrast
Comparison: [DATE], [DATE]

CLINICAL DATA: Somnolent, altered level of consciousness, right
upper lobe non-small cell lung cancer

EXAM:
CT HEAD WITHOUT CONTRAST
TECHNIQUE: Contiguous axial images were obtained from the base of the skull
through the vertex without intravenous contrast.

[Series 2: head wo · axial · 0.41mm/px · z∈[+1375,+1500]mm · 9 of 31 slices shown, 12 images]
[im 3/31  brain]
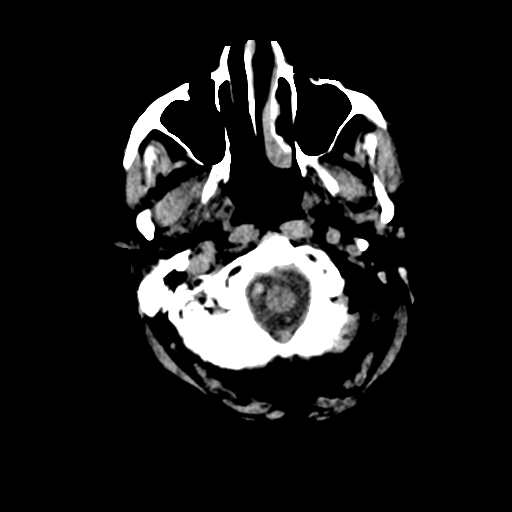
[im 3/31  bone]
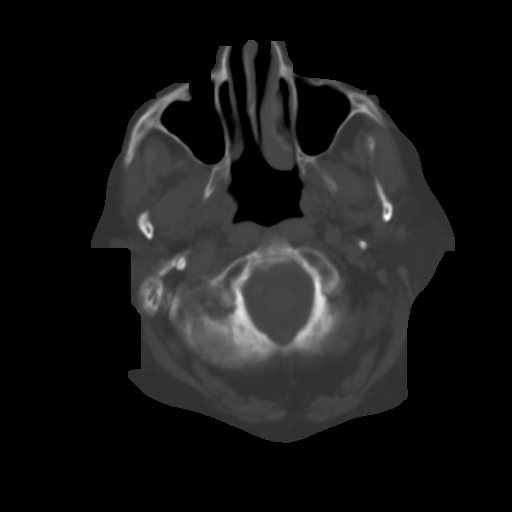
[im 6/31  brain]
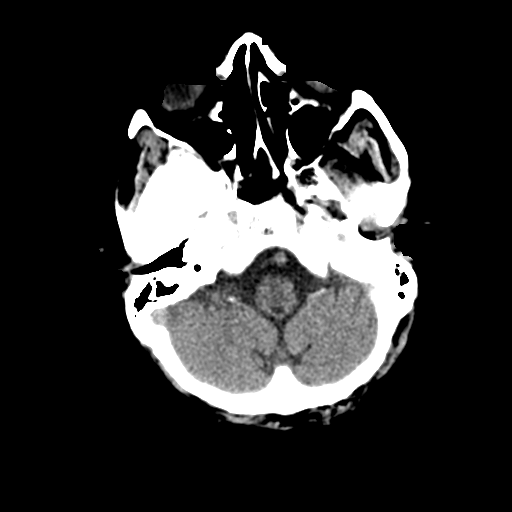
[im 9/31  brain]
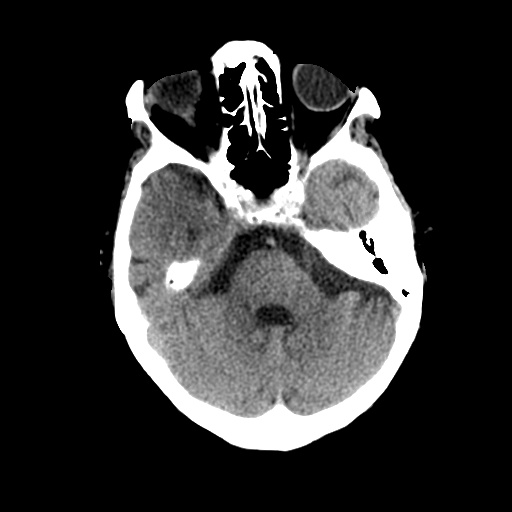
[im 12/31  brain]
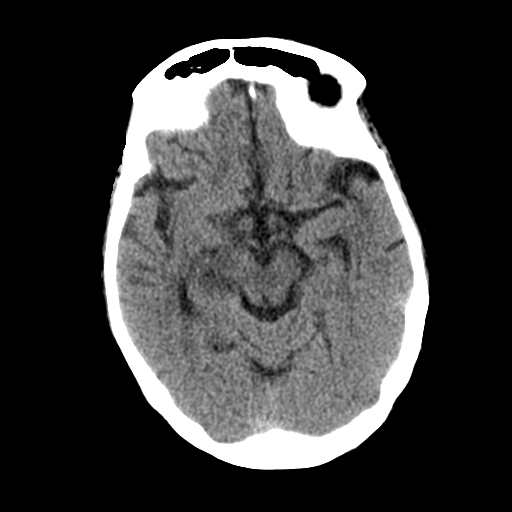
[im 16/31  brain]
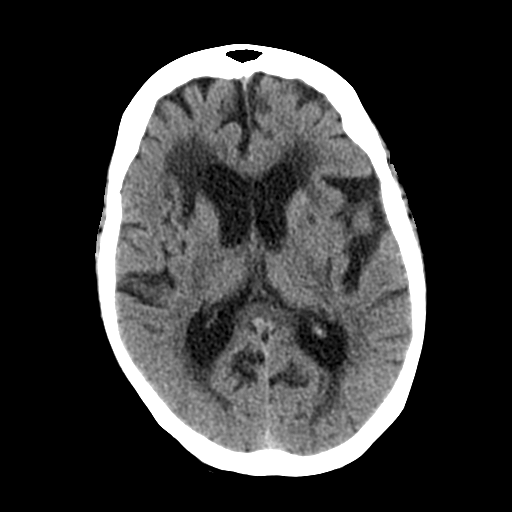
[im 16/31  bone]
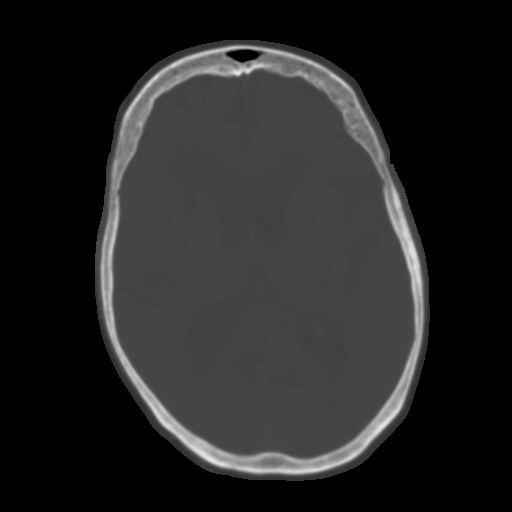
[im 19/31  brain]
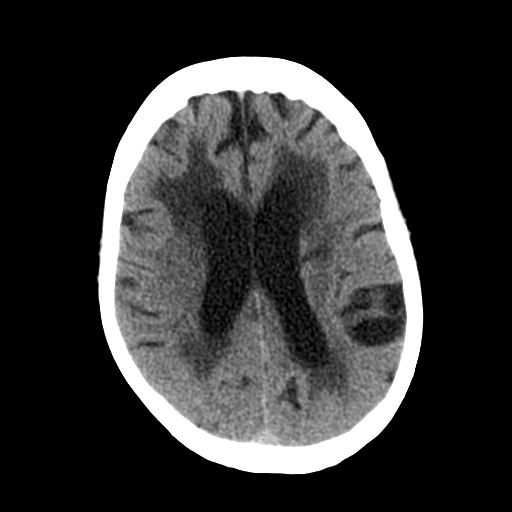
[im 22/31  brain]
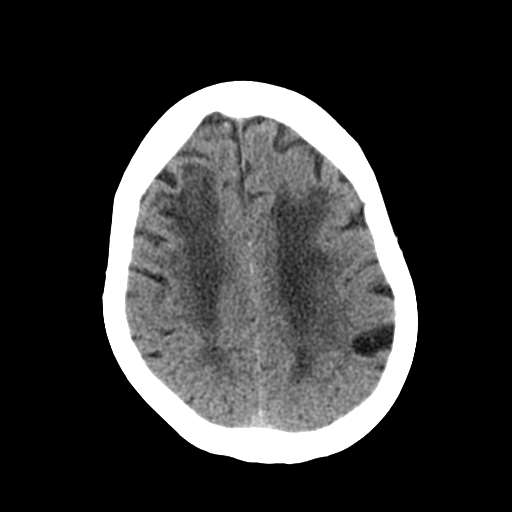
[im 25/31  brain]
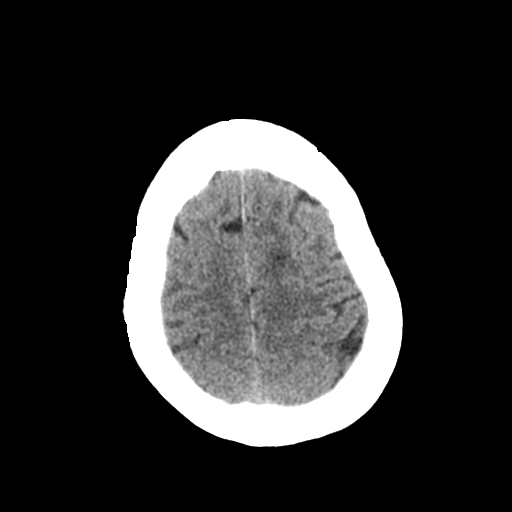
[im 28/31  brain]
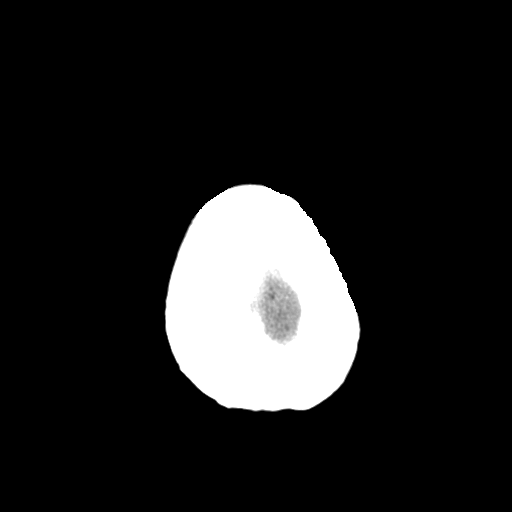
[im 28/31  bone]
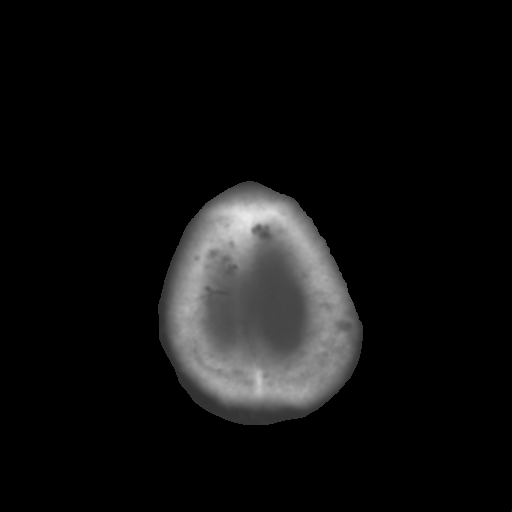

[Series 5: coronal soft tissue · coronal · 0.28mm/px · 3 of 64 slices shown]
[im 22/64  brain]
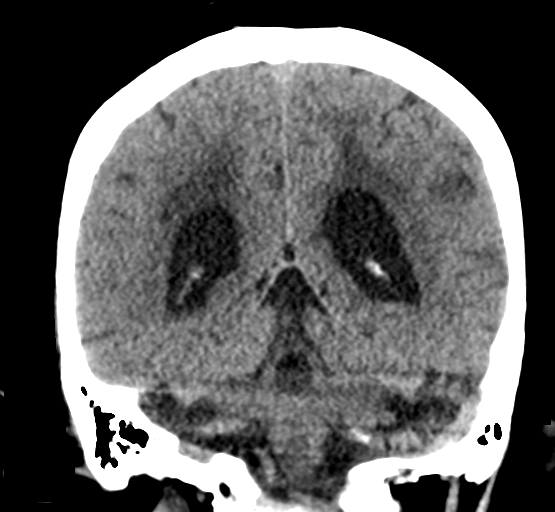
[im 29/64  brain]
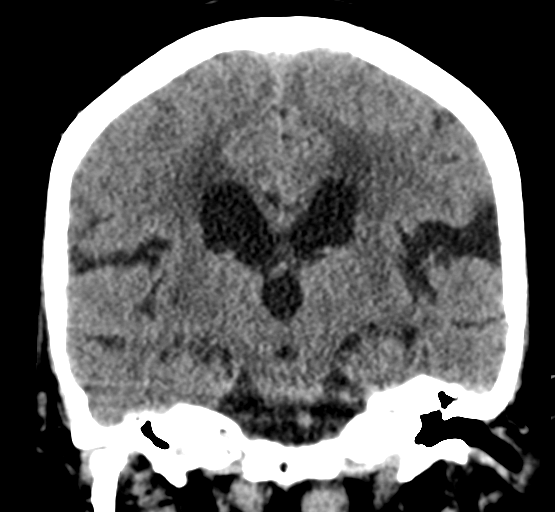
[im 36/64  brain]
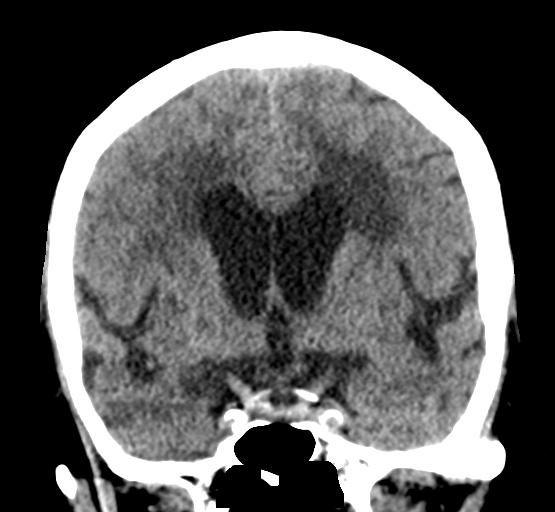

[Series 6: sagittal soft tissue · sagittal · 0.28mm/px · 3 of 52 slices shown]
[im 18/52  brain]
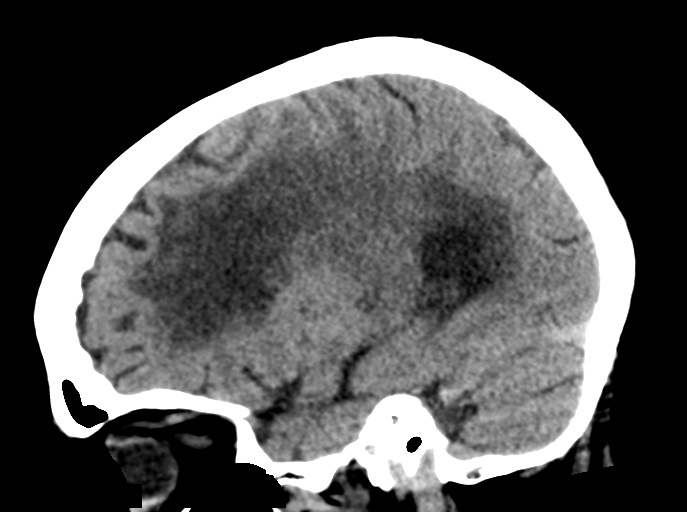
[im 26/52  brain]
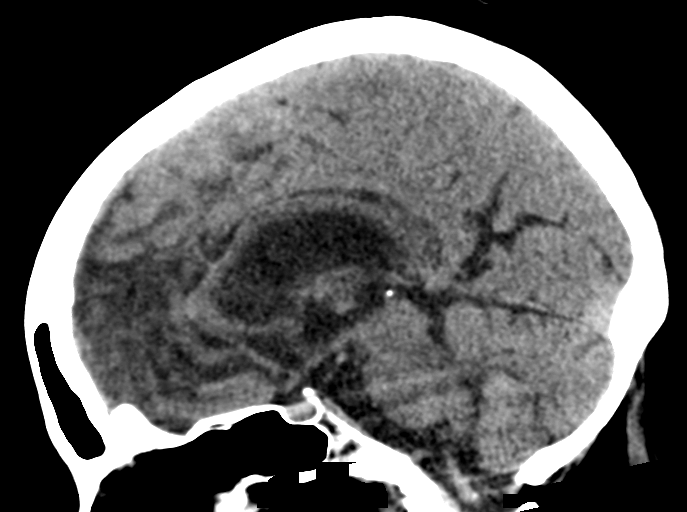
[im 35/52  brain]
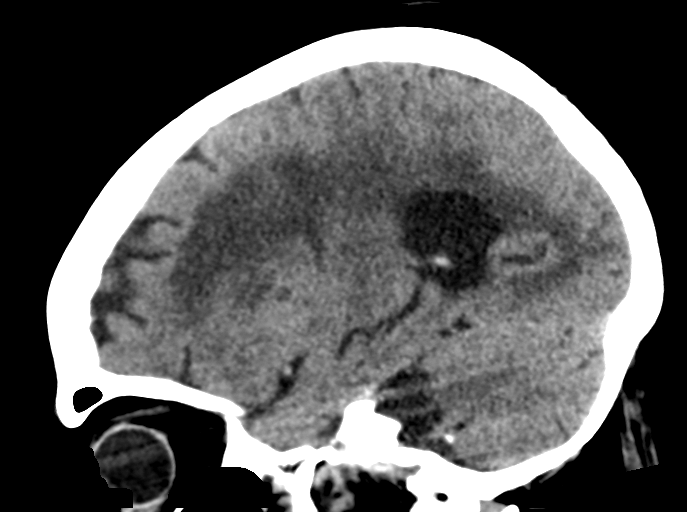

[15 of 47 positions shown; findings below may reference images not displayed]

FINDINGS: Brain: Chronic small vessel ischemic changes are seen throughout the
periventricular and subcortical white matter, stable. Chronic small
lacunar infarcts are seen within the bilateral basal ganglia,
stable. No signs of acute infarct or hemorrhage. Lateral ventricles
and midline structures are otherwise unremarkable. No acute
extra-axial fluid collections. No mass effect.

Vascular: No hyperdense vessel or unexpected calcification.

Skull: Normal. Negative for fracture or focal lesion.

Sinuses/Orbits: No acute finding.

Other: None.
IMPRESSION: 1. Stable chronic small vessel ischemic changes. No acute
intracranial process.

## 2020-05-11 MED ORDER — SODIUM CHLORIDE 0.9 % IV SOLN
10.0000 mg | Freq: Once | INTRAVENOUS | Status: AC
  Administered 2020-05-11: 10 mg via INTRAVENOUS
  Filled 2020-05-11: qty 10

## 2020-05-11 MED ORDER — NALOXONE HCL 0.4 MG/ML IJ SOLN
0.4000 mg | INTRAMUSCULAR | Status: DC | PRN
  Administered 2020-05-11: 0.4 mg via INTRAVENOUS

## 2020-05-11 MED ORDER — ENOXAPARIN SODIUM 40 MG/0.4ML ~~LOC~~ SOLN: 40.0000 mg | SUBCUTANEOUS | Status: DC

## 2020-05-11 MED ORDER — NALOXONE HCL 0.4 MG/ML IJ SOLN
INTRAMUSCULAR | Status: AC
  Filled 2020-05-11: qty 1

## 2020-05-11 MED ORDER — SIMVASTATIN 20 MG PO TABS
20.0000 mg | ORAL_TABLET | Freq: Every day | ORAL | Status: DC
  Administered 2020-05-12: 20 mg via ORAL
  Filled 2020-05-11: qty 1

## 2020-05-11 MED ORDER — SODIUM CHLORIDE 0.9 % IV SOLN
1.0000 g | Freq: Once | INTRAVENOUS | Status: AC
  Administered 2020-05-11: 1 g via INTRAVENOUS
  Filled 2020-05-11: qty 10

## 2020-05-11 MED ORDER — WARFARIN SODIUM 5 MG PO TABS: 5.0000 mg | ORAL_TABLET | ORAL | Status: DC

## 2020-05-11 MED ORDER — SODIUM CHLORIDE 0.9 % IV SOLN
1.0000 g | INTRAVENOUS | Status: DC
  Administered 2020-05-12 – 2020-05-13 (×2): 1 g via INTRAVENOUS
  Filled 2020-05-11: qty 10
  Filled 2020-05-11: qty 1

## 2020-05-11 MED ORDER — SODIUM CHLORIDE 0.9 % IV SOLN
513.0000 mg | Freq: Once | INTRAVENOUS | Status: AC
  Administered 2020-05-11: 510 mg via INTRAVENOUS
  Filled 2020-05-11: qty 51

## 2020-05-11 MED ORDER — METOPROLOL SUCCINATE ER 25 MG PO TB24
12.5000 mg | ORAL_TABLET | Freq: Every day | ORAL | Status: DC
  Administered 2020-05-12: 12.5 mg via ORAL
  Filled 2020-05-11: qty 1

## 2020-05-11 MED ORDER — SODIUM CHLORIDE 0.9 % IV BOLUS
1000.0000 mL | Freq: Once | INTRAVENOUS | Status: AC
  Administered 2020-05-11: 1000 mL via INTRAVENOUS

## 2020-05-11 MED ORDER — POTASSIUM CHLORIDE IN NACL 40-0.9 MEQ/L-% IV SOLN
INTRAVENOUS | Status: DC
  Filled 2020-05-11 (×3): qty 1000

## 2020-05-11 MED ORDER — PALONOSETRON HCL INJECTION 0.25 MG/5ML
INTRAVENOUS | Status: AC
  Filled 2020-05-11: qty 5

## 2020-05-11 MED ORDER — SODIUM CHLORIDE 0.9 % IV SOLN
150.0000 mg | Freq: Once | INTRAVENOUS | Status: AC
  Administered 2020-05-11: 150 mg via INTRAVENOUS
  Filled 2020-05-11: qty 150

## 2020-05-11 MED ORDER — PALONOSETRON HCL INJECTION 0.25 MG/5ML
0.2500 mg | Freq: Once | INTRAVENOUS | Status: AC
  Administered 2020-05-11: 0.25 mg via INTRAVENOUS

## 2020-05-11 MED ORDER — SODIUM CHLORIDE 0.9 % IV SOLN
Freq: Once | INTRAVENOUS | Status: AC
  Filled 2020-05-11: qty 250

## 2020-05-11 MED ORDER — SODIUM CHLORIDE 0.9 % IV SOLN
100.0000 mg/m2 | Freq: Once | INTRAVENOUS | Status: DC
  Filled 2020-05-11: qty 10.5

## 2020-05-11 MED ORDER — TIMOLOL MALEATE 0.5 % OP SOLN
1.0000 [drp] | Freq: Every day | OPHTHALMIC | Status: DC
  Administered 2020-05-12: 1 [drp] via OPHTHALMIC
  Filled 2020-05-11: qty 5

## 2020-05-11 MED FILL — Dexamethasone Sodium Phosphate Inj 100 MG/10ML: INTRAMUSCULAR | Qty: 1 | Status: AC

## 2020-05-11 NOTE — ED Notes (Signed)
Placed on 2L of O2 because she was desatting to the low 80's when she is sleeping

## 2020-05-11 NOTE — Progress Notes (Signed)
Prior to administration of chemotherapy patient had change in alertness.  Pt very lethargic, hard to arouse with verbal commands.  Pt responding to physical stimuli.  VS obtained via flow sheets.   Cold pack applied, patient mildly arousing with intervention.    RN inquired what medications she took before arriving to clinic.  Pt states, "nausea medication and pain medication."  RN reviewed MAR provided by [facility], no pain medication noted on MAR.    Sandi Mealy, PA-C assessed patient, verbal orders given for Narcan 0.4mg .  Narcan administered at 1534, no response.  Verbal orders for narcan 0.4 @ 1540, administered with full response.    RN notified [name], step-daughter.     After completion of chemotherapy, Carboplatin, patient became very lethargic and hard to arouse with verbal and physical stimuli.  BP 109 52.  Step daughter at bedside, denies this being patient's baseline.  Van Tanner-PA-C notified rapid response team @ 1645.  VS @ 1645 100/59, P 77, R 14, o2 on RA 96%, @ 1648 101/68, P 72, R 12, o2 on RA 97%.  Rapid response arrived @ 1650.  Ammonia stick used by rapid response team, patient aroused.  1657 patient was transferred to ED by rapid response and RN.

## 2020-05-11 NOTE — Telephone Encounter (Signed)
Received call with INR result for patient:  INR 2.5.   Nurse reported previous order was 2.5mg  on T/W/R/Sat/Sun and 5mg  on Mon/Fri (confirmed in epic) Will continue this same dose and recheck INR on Friday b/c of recent INR of 3.4 9/14 so held one day.   If elevates over 3 again, dose will need to be adjusted rather than holding the medication repeatedly.  Avoid weekend INRs. Reviewing chart, I don't see the palliative care consult done that I ordered at admission that patient agreed to.    Brette Cast L. Christin Mccreedy, D.O. [facility] [address], [place] [address] Cell Phone (Mon-Fri 8am-5pm):  [phone] On Call:  [phone] & follow prompts after 5pm & weekends Office Phone:  [phone] Office Fax:  [phone]

## 2020-05-11 NOTE — ED Provider Notes (Signed)
[place] EMERGENCY DEPARTMENT Provider Note  CSN: [id number] Arrival date & time: 05/11/20 1706    History Chief Complaint  Patient presents with  . Altered Mental Status    HPI  Leah Velasquez is a 79 y.o. 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 Per report, she had received her pre-treatment meds (Decadron 10mg , Emend 150mg  and Aloxi 0.25mg ). Prior to getting her Carboplatin, she became somnolent and minimally responsive. The Oncology PA was called and she was given Narcan x 2 after patient reported she had taken a pain pill with some response. She does not have any narcotics on her [facility]TCF [facility] per Oncology RN note. She was given her Carboplatin and subsequently became somnolent again. Rapid Response was called and she roused with ammonia capsule and was brought to the ED. 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 She is complaining of dry mouth but unable to give any additional history.    Past Medical History:  Diagnosis Date  . A-fib ([place])   . Dysrhythmia   . GERD (gastroesophageal reflux disease)   . Hemorrhoids   . Hyperlipidemia   . Hypertension   . Lung cancer ([place]) 2000   RLL  . Obesity   . Persistent atrial fibrillation ([place])   . Rheumatoid arthritis [facility])     Past Surgical History:  Procedure Laterality Date  . BREAST EXCISIONAL BIOPSY Left    x 2  . BREAST EXCISIONAL BIOPSY Right   . BRONCHIAL BIOPSY  02/18/2020   Procedure: BRONCHIAL BIOPSIES;  Surgeon: Collene Gobble, MD;  Location: [facility] ENDOSCOPY;  Service: Cardiopulmonary;;  . BRONCHIAL BRUSHINGS  02/18/2020   Procedure: BRONCHIAL BRUSHINGS;  Surgeon: Collene Gobble, MD;  Location: [place];  Service: Cardiopulmonary;;  . BRONCHIAL NEEDLE ASPIRATION BIOPSY  02/18/2020    Procedure: BRONCHIAL NEEDLE ASPIRATION BIOPSIES;  Surgeon: Collene Gobble, MD;  Location: [place];  Service: Cardiopulmonary;;  . BRONCHIAL WASHINGS  02/18/2020   Procedure: BRONCHIAL WASHINGS;  Surgeon: Collene Gobble, MD;  Location: [place];  Service: Cardiopulmonary;;  . CARDIOVERSION N/A 01/09/2018   Procedure: CARDIOVERSION;  Surgeon: Josue Hector, MD;  Location: [facility] ENDOSCOPY;  Service: Cardiovascular;  Laterality: N/A;  . CARDIOVERSION N/A 03/01/2018   Procedure: CARDIOVERSION;  Surgeon: Sueanne Margarita, MD;  Location: [facility] ENDOSCOPY;  Service: Cardiovascular;  Laterality: N/A;  . ELECTROMAGNETIC NAVIGATION BROCHOSCOPY N/A 02/18/2020   Procedure: ELECTROMAGNETIC NAVIGATION BRONCHOSCOPY;  Surgeon: Collene Gobble, MD;  Location: [place];  Service: Cardiopulmonary;  Laterality: N/A;  . KNEE SURGERY    . LUNG LOBECTOMY Right 2000   RLL removed  . VIDEO BRONCHOSCOPY N/A 02/18/2020   Procedure: VIDEO BRONCHOSCOPY WITH FLUORO;  Surgeon: Collene Gobble, MD;  Location: [facility] ENDOSCOPY;  Service: Cardiopulmonary;  Laterality: N/A;  . VIDEO BRONCHOSCOPY WITH ENDOBRONCHIAL ULTRASOUND N/A 04/22/2020   Procedure: VIDEO BRONCHOSCOPY WITH ENDOBRONCHIAL ULTRASOUND;  Surgeon: [name], MD;  Location: [place];  Service: Thoracic;  Laterality: N/A;    History reviewed. No pertinent family history.  Social History   Tobacco Use  . Smoking status: Former Research scientist (life sciences)  . Smokeless tobacco: Never Used  Vaping Use  . Vaping Use: Never used  Substance Use Topics  . Alcohol  use: No  . Drug use: No     Home Medications Prior to Admission medications   Medication Sig Start Date End Date Taking? Authorizing Provider  conjugated estrogens (PREMARIN) vaginal cream Place 1 Applicatorful vaginally daily.   Yes [provider]  furosemide (LASIX) 40 MG tablet Take 40 mg by mouth daily.   Yes [provider]  losartan (COZAAR) 50 MG tablet Take 50 mg by mouth daily.   Yes  [provider]  [employer] VITAMIN B12 1000 MCG tablet Take 1,000 mcg by mouth daily. 02/20/20   [provider]  diclofenac Sodium (VOLTAREN) 1 % GEL Apply 4 g topically 4 (four) times daily. To bilateral knees and ankles 04/24/20   Reed, Tiffany L, DO  levocetirizine (XYZAL) 5 MG tablet Take 5 mg by mouth daily. 01/28/20   [provider]  loratadine (CLARITIN) 10 MG tablet One daily prior to pegfilgrastim & for 4-10 days if needed for bone pain.  Sent to [facility].  May take with Tylenol. 05/08/20   Heilingoetter, Cassandra L, PA-C  losartan (COZAAR) 25 MG tablet Take 25 mg by mouth daily.    [provider]  Magnesium Hydroxide (BL MILK OF MAGNESIA PO) Take by mouth. If no BM in 3 day, give 30 cc Milk of magnesium p.o. x 1 dose in 24 hours as needed    [provider]  metoprolol succinate (TOPROL-XL) 25 MG 24 hr tablet Take 12.5 mg by mouth at bedtime.  12/18/19   [provider]  oxybutynin (DITROPAN XL) 5 MG 24 hr tablet Take 1 tablet (5 mg total) by mouth at bedtime. 04/22/20   Iona Beard, MD  prochlorperazine (COMPAZINE) 10 MG tablet One tab every 6 hours prn nausea-Sent to [facility] facility on pt's paperwork per Cassie H PA. 05/08/20   Heilingoetter, Cassandra L, PA-C  simvastatin (ZOCOR) 20 MG tablet Take 20 mg by mouth at bedtime. 01/28/20   [provider]  timolol (TIMOPTIC) 0.5 % ophthalmic solution Place 1 drop into both eyes at bedtime. 11/03/19   [provider]  vitamin B-12 1000 MCG tablet Take 1 tablet (1,000 mcg total) by mouth daily. 02/20/20   Andrew Au, MD  warfarin (COUMADIN) 2.5 MG tablet Take 2.5 mg by mouth See admin instructions. On Tue-Wed-Thu-Sat-Sun    [provider]  warfarin (COUMADIN) 5 MG tablet Take 5 mg by mouth See admin instructions. Monday and Friday    [provider]  zolpidem (AMBIEN) 5 MG tablet Take 1 tablet (5 mg total) by mouth at bedtime as needed for  sleep. 04/23/20 05/23/20  Iona Beard, MD     Allergies    Morphine, Morphine and related, Morphine and related, Sulfa antibiotics, and Sulfa antibiotics   Review of Systems   Review of Systems Unable to assess due to mental status.    Physical Exam BP 132/82   Pulse 77   Temp 97.7 F (36.5 C) (Oral)   Resp 20   Ht 5\' 1"  (1.549 m)   Wt 104 kg   SpO2 96%   BMI 43.32 kg/m   Physical Exam Vitals and nursing note reviewed.  Constitutional:      Appearance: She is obese.  HENT:     Head: Normocephalic and atraumatic.     Nose: Nose normal.     Mouth/Throat:     Mouth: Mucous membranes are dry.  Eyes:     Extraocular Movements: Extraocular movements intact.  Conjunctiva/sclera: Conjunctivae normal.  Cardiovascular:     Rate and Rhythm: Normal rate. Rhythm irregular.  Pulmonary:     Effort: Pulmonary effort is normal.     Breath sounds: Normal breath sounds.  Abdominal:     General: Abdomen is flat.     Palpations: Abdomen is soft.     Tenderness: There is no abdominal tenderness.  Musculoskeletal:        General: No swelling. Normal range of motion.     Cervical back: Neck supple.  Skin:    General: Skin is warm and dry.  Neurological:     Mental Status: She is disoriented.     Comments: Somnolent, moves all four extremities      ED Results / Procedures / Treatments   Labs (all labs ordered are listed, but only abnormal results are displayed) Labs Reviewed  COMPREHENSIVE METABOLIC PANEL - Abnormal; Notable for the following components:      Result Value   Sodium 133 (*)    Chloride 95 (*)    Glucose, Bld 124 (*)    Albumin 3.3 (*)    All other components within normal limits  PROTIME-INR - Abnormal; Notable for the following components:   Prothrombin Time 25.4 (*)    INR 2.4 (*)    All other components within normal limits  URINALYSIS, ROUTINE W REFLEX MICROSCOPIC - Abnormal; Notable for the following components:   APPearance CLOUDY (*)    Hgb  urine dipstick MODERATE (*)    Protein, ur 30 (*)    Leukocytes,Ua LARGE (*)    WBC, UA >50 (*)    Bacteria, UA MANY (*)    All other components within normal limits  URINE CULTURE  SARS CORONAVIRUS 2 BY RT PCR (HOSPITAL ORDER, [place] [facility])  CBC WITH DIFFERENTIAL/PLATELET  RAPID URINE DRUG SCREEN, HOSP PERFORMED    EKG EKG Interpretation  Date/Time:  Monday May 11 2020 17:57:22 EDT Ventricular Rate:  60 PR Interval:    QRS Duration: 97 QT Interval:  422 QTC Calculation: 422 R Axis:   9 Text Interpretation: Atrial fibrillation Borderline low voltage, extremity leads Abnormal R-wave progression, early transition Nonspecific T abnrm, anterolateral leads No significant change since last tracing Confirmed by Calvert Cantor [phone]) on 05/11/2020 6:07:40 PM   Radiology CT Head Wo Contrast  Result Date: 05/11/2020 CLINICAL DATA:  Somnolent, altered level of consciousness, right upper lobe non-small cell lung cancer EXAM: CT HEAD WITHOUT CONTRAST TECHNIQUE: Contiguous axial images were obtained from the base of the skull through the vertex without intravenous contrast. COMPARISON:  02/07/2020, 02/05/2020 FINDINGS: Brain: Chronic small vessel ischemic changes are seen throughout the periventricular and subcortical white matter, stable. 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 No signs of acute infarct or hemorrhage. Lateral ventricles and midline structures are otherwise unremarkable. No acute extra-axial fluid collections. No mass effect. Vascular: No hyperdense vessel or unexpected calcification. Skull: Normal. Negative for fracture or focal lesion. Sinuses/Orbits: No acute finding. Other: None. IMPRESSION: 1. Stable chronic small vessel ischemic changes. No acute intracranial process. Electronically Signed   By: Randa Ngo M.D.   On: 05/11/2020 18:44    Procedures Procedures  Medications Ordered in the  ED Medications  cefTRIAXone (ROCEPHIN) 1 g in sodium chloride 0.9 % 100 mL IVPB (has no administration in time range)     MDM Rules/Calculators/A&P MDM Spoke with patient's Daughter-in-law, [name] at bedside. She reports that the patient told her that she  had been given a nausea and pain pill from staff at [facility] today. No narcotics on MAR that I reviewed. She also reports the patient is at [employer] due to frequent falls and generalized weakness. She states she has noticed that the patient has had progressive confusion and forgetfulness over the last few months. 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 Will check labs, including UA and UDS as well as a CT head as she is on coumadin to eval spontaneous hemorrhage.  ED Course  I have reviewed the triage vital signs and the nursing notes.  Pertinent labs & imaging results that were available during my care of the patient were reviewed by me and considered in my medical decision making (see chart for details).  Clinical Course as of May 11 2148  Mon May 11, 2020  1807 CBC is normal.    [CS]  1610 CMP with no concerning findings.    [CS]  9604 INR at baseline.    [CS]  5409 CT images and results reviewed, no acute process.    [CS]  2046 UDS is neg for opiates or other sedative meds.    [CS]  2049 UA noted to show signs of infection. Will order culture, Rocephin and plan admission for further evaluation.    [CS]  2119 Spoke with Dr. Jonnie Finner, Hospitalist, who will evaluate the patient for admission.    [CS]    Clinical Course User Index [CS] Truddie Hidden, MD    Final Clinical Impression(s) / ED Diagnoses Final diagnoses:  Altered mental status, unspecified altered mental status type  Acute cystitis without hematuria    Rx / DC Orders ED Discharge Orders    None       Truddie Hidden, MD 05/11/20 2150

## 2020-05-11 NOTE — Progress Notes (Deleted)
Recent dx lung Ca,   Decadron and IV antiemetic, became unresponsive >> had taken pain pill at the SNF, narcan a little better.  Gave her chemo, got worse, get smelling salts > sent to ED.  dtr says not baseline, although dtr says memory going 1-2 months.  [facility], some falls , 30 day rehab.

## 2020-05-11 NOTE — Patient Instructions (Signed)
[place] Discharge Instructions for Patients Receiving Chemotherapy  Today you received the following chemotherapy agents Carboplatin, Etoposide.   To help prevent nausea and vomiting after your treatment, we encourage you to take your nausea medication as directed.   If you develop nausea and vomiting that is not controlled by your nausea medication, call the clinic.   BELOW ARE SYMPTOMS THAT SHOULD BE REPORTED IMMEDIATELY:  *FEVER GREATER THAN 100.5 F  *CHILLS WITH OR WITHOUT FEVER  NAUSEA AND VOMITING THAT IS NOT CONTROLLED WITH YOUR NAUSEA MEDICATION  *UNUSUAL SHORTNESS OF BREATH  *UNUSUAL BRUISING OR BLEEDING  TENDERNESS IN MOUTH AND THROAT WITH OR WITHOUT PRESENCE OF ULCERS  *URINARY PROBLEMS  *BOWEL PROBLEMS  UNUSUAL RASH   Feel free to call the clinic should you have any questions or concerns. The clinic phone number is [phone].  Please show the [facility] at check-in to the Emergency Department and triage nurse.  Carboplatin injection What is this medicine? CARBOPLATIN (KAR boe pla tin) is a chemotherapy drug. It targets fast dividing cells, like cancer cells, and causes these cells to die. This medicine is used to treat ovarian cancer and many other cancers. This medicine may be used for other purposes; ask your health care provider or pharmacist if you have questions. COMMON BRAND NAME(S): Paraplatin What should I tell my health care provider before I take this medicine? They need to know if you have any of these conditions:  blood disorders  hearing problems  kidney disease  recent or ongoing radiation therapy  an unusual or allergic reaction to carboplatin, cisplatin, other chemotherapy, other medicines, foods, dyes, or preservatives  pregnant or trying to get pregnant  breast-feeding How should I use this medicine? This drug is usually given as an infusion into a vein. It is administered in a hospital or clinic by a  specially trained health care professional. Talk to your pediatrician regarding the use of this medicine in children. Special care may be needed. Overdosage: If you think you have taken too much of this medicine contact a poison control center or emergency room at once. NOTE: This medicine is only for you. Do not share this medicine with others. What if I miss a dose? It is important not to miss a dose. Call your doctor or health care professional if you are unable to keep an appointment. What may interact with this medicine?  medicines for seizures  medicines to increase blood counts like filgrastim, pegfilgrastim, sargramostim  some antibiotics like amikacin, gentamicin, neomycin, streptomycin, tobramycin  vaccines Talk to your doctor or health care professional before taking any of these medicines:  acetaminophen  aspirin  ibuprofen  ketoprofen  naproxen This list may not describe all possible interactions. Give your health care provider a list of all the medicines, herbs, non-prescription drugs, or dietary supplements you use. Also tell them if you smoke, drink alcohol, or use illegal drugs. Some items may interact with your medicine. What should I watch for while using this medicine? Your condition will be monitored carefully while you are receiving this medicine. You will need important blood work done while you are taking this medicine. This drug may make you feel generally unwell. This is not uncommon, as chemotherapy can affect healthy cells as well as cancer cells. Report any side effects. Continue your course of treatment even though you feel ill unless your doctor tells you to stop. In some cases, you may be given additional medicines to help with side  effects. Follow all directions for their use. Call your doctor or health care professional for advice if you get a fever, chills or sore throat, or other symptoms of a cold or flu. Do not treat yourself. This drug decreases  your body's ability to fight infections. Try to avoid being around people who are sick. This medicine may increase your risk to bruise or bleed. Call your doctor or health care professional if you notice any unusual bleeding. Be careful brushing and flossing your teeth or using a toothpick because you may get an infection or bleed more easily. If you have any dental work done, tell your dentist you are receiving this medicine. Avoid taking products that contain aspirin, acetaminophen, ibuprofen, naproxen, or ketoprofen unless instructed by your doctor. These medicines may hide a fever. Do not become pregnant while taking this medicine. Women should inform their doctor if they wish to become pregnant or think they might be pregnant. There is a potential for serious side effects to an unborn child. Talk to your health care professional or pharmacist for more information. Do not breast-feed an infant while taking this medicine. What side effects may I notice from receiving this medicine? Side effects that you should report to your doctor or health care professional as soon as possible:  allergic reactions like skin rash, itching or hives, swelling of the face, lips, or tongue  signs of infection - fever or chills, cough, sore throat, pain or difficulty passing urine  signs of decreased platelets or bleeding - bruising, pinpoint red spots on the skin, black, tarry stools, nosebleeds  signs of decreased red blood cells - unusually weak or tired, fainting spells, lightheadedness  breathing problems  changes in hearing  changes in vision  chest pain  high blood pressure  low blood counts - This drug may decrease the number of white blood cells, red blood cells and platelets. You may be at increased risk for infections and bleeding.  nausea and vomiting  pain, swelling, redness or irritation at the injection site  pain, tingling, numbness in the hands or feet  problems with balance,  talking, walking  trouble passing urine or change in the amount of urine Side effects that usually do not require medical attention (report to your doctor or health care professional if they continue or are bothersome):  hair loss  loss of appetite  metallic taste in the mouth or changes in taste This list may not describe all possible side effects. Call your doctor for medical advice about side effects. You may report side effects to FDA at 1-800-FDA-1088. Where should I keep my medicine? This drug is given in a hospital or clinic and will not be stored at home. NOTE: This sheet is a summary. It may not cover all possible information. If you have questions about this medicine, talk to your doctor, pharmacist, or health care provider.  2020 Elsevier/Gold Standard (2007-11-13 14:38:05)  Etoposide, VP-16 injection What is this medicine? ETOPOSIDE, VP-16 (e toe POE side) is a chemotherapy drug. It is used to treat testicular cancer, lung cancer, and other cancers. This medicine may be used for other purposes; ask your health care provider or pharmacist if you have questions. COMMON BRAND NAME(S): Etopophos, Toposar, VePesid What should I tell my health care provider before I take this medicine? They need to know if you have any of these conditions:  infection  kidney disease  liver disease  low blood counts, like low white cell, platelet, or red cell counts  an unusual or allergic reaction to etoposide, other medicines, foods, dyes, or preservatives  pregnant or trying to get pregnant  breast-feeding How should I use this medicine? This medicine is for infusion into a vein. It is administered in a hospital or clinic by a specially trained health care professional. Talk to your pediatrician regarding the use of this medicine in children. Special care may be needed. Overdosage: If you think you have taken too much of this medicine contact a poison control center or emergency room  at once. NOTE: This medicine is only for you. Do not share this medicine with others. What if I miss a dose? It is important not to miss your dose. Call your doctor or health care professional if you are unable to keep an appointment. What may interact with this medicine? This medicine may interact with the following medications:  warfarin This list may not describe all possible interactions. Give your health care provider a list of all the medicines, herbs, non-prescription drugs, or dietary supplements you use. Also tell them if you smoke, drink alcohol, or use illegal drugs. Some items may interact with your medicine. What should I watch for while using this medicine? Visit your doctor for checks on your progress. This drug may make you feel generally unwell. This is not uncommon, as chemotherapy can affect healthy cells as well as cancer cells. Report any side effects. Continue your course of treatment even though you feel ill unless your doctor tells you to stop. In some cases, you may be given additional medicines to help with side effects. Follow all directions for their use. Call your doctor or health care professional for advice if you get a fever, chills or sore throat, or other symptoms of a cold or flu. Do not treat yourself. This drug decreases your body's ability to fight infections. Try to avoid being around people who are sick. This medicine may increase your risk to bruise or bleed. Call your doctor or health care professional if you notice any unusual bleeding. Talk to your doctor about your risk of cancer. You may be more at risk for certain types of cancers if you take this medicine. Do not become pregnant while taking this medicine or for at least 6 months after stopping it. Women should inform their doctor if they wish to become pregnant or think they might be pregnant. Women of child-bearing potential will need to have a negative pregnancy test before starting this medicine.  There is a potential for serious side effects to an unborn child. Talk to your health care professional or pharmacist for more information. Do not breast-feed an infant while taking this medicine. Men must use a latex condom during sexual contact with a woman while taking this medicine and for at least 4 months after stopping it. A latex condom is needed even if you have had a vasectomy. Contact your doctor right away if your partner becomes pregnant. Do not donate sperm while taking this medicine and for at least 4 months after you stop taking this medicine. Men should inform their doctors if they wish to father a child. This medicine may lower sperm counts. What side effects may I notice from receiving this medicine? Side effects that you should report to your doctor or health care professional as soon as possible:  allergic reactions like skin rash, itching or hives, swelling of the face, lips, or tongue  low blood counts - this medicine may decrease the number of white blood cells, red  blood cells, and platelets. You may be at increased risk for infections and bleeding  nausea, vomiting  redness, blistering, peeling or loosening of the skin, including inside the mouth  signs and symptoms of infection like fever; chills; cough; sore throat; pain or trouble passing urine  signs and symptoms of low red blood cells or anemia such as unusually weak or tired; feeling faint or lightheaded; falls; breathing problems  unusual bruising or bleeding Side effects that usually do not require medical attention (report to your doctor or health care professional if they continue or are bothersome):  changes in taste  diarrhea  hair loss  loss of appetite  mouth sores This list may not describe all possible side effects. Call your doctor for medical advice about side effects. You may report side effects to FDA at 1-800-FDA-1088. Where should I keep my medicine? This drug is given in a hospital or  clinic and will not be stored at home. NOTE: This sheet is a summary. It may not cover all possible information. If you have questions about this medicine, talk to your doctor, pharmacist, or health care provider.  2020 Elsevier/Gold Standard (2018-10-03 16:57:15)

## 2020-05-11 NOTE — ED Triage Notes (Signed)
Arrives via [facility] from the cancer center, this was her first chemo trt today, the center gave her pre=meds and she became unresponsive. She responded to the 2nd dose of Narcan, per [facility] RN she responded to ammonia tablet. Pt is somnolent, but wakes easily.

## 2020-05-11 NOTE — H&P (Addendum)
[facility] History & Physical  Rob Doctor, hospital MD  Leah Velasquez 05/11/2020  Chief Complaint: Confusion HPI: The patient is a 79 y.o. year-old w/ hx of RA, persistent atrial fib, HTN, HL, obesity and recently dx'd lung Ca. She was sent from [facility] to Cancer center for 1st chemo today and rec'd some decadron and IV antiemetic premeds then became poorly responsive. They gave her Narcan w/ partial response x  2 and then she improved some and received her chemotherapy. Then MS was worse again. Rapid response arrived and used an ammonia stick which helped patient to arouse. Pt was transferred then to the ED.  In the ED BP 130/82, HR 74, RR 16- 20 and temp 97.7.  SpO2 97% on RA.   B/Cr wnl, WBC / Hb / plts all normal. Glu 124. UA looks infected w/ many bacteria and > 50 wbc, cloudy, large LE.  UDS was negative. UCx sent and pt rec'd IV Rocephin.  ED providers d/w daughter that patient's baseline mental status is not like this, although she has had issues w/ memory loss over the past few months. Asked to see for hospital admission.   Pt seen in ED.  Lying flat, alert and awake, eating graham crackers.  States "I feel a lot more alive". Denies any current CP, sob, abd pain, dysuria, voiding issues. No focal weakness, n/v/d , swallowing problems.    ROS  denies CP  no joint pain   no HA  no blurry vision  no rash  no diarrhea  no nausea/ vomiting  no dysuria  no difficulty voiding  Past Medical History  Past Medical History:  Diagnosis Date  . A-fib ([place])   . Dysrhythmia   . GERD (gastroesophageal reflux disease)   . Hemorrhoids   . Hyperlipidemia   . Hypertension   . Lung cancer ([place]) 2000   RLL  . Obesity   . Persistent atrial fibrillation ([place])   . Rheumatoid arthritis [facility])    Past Surgical History  Past Surgical History:  Procedure Laterality Date  . BREAST EXCISIONAL BIOPSY Left    x 2  . BREAST EXCISIONAL BIOPSY Right   . BRONCHIAL BIOPSY  02/18/2020    Procedure: BRONCHIAL BIOPSIES;  Surgeon: Collene Gobble, MD;  Location: [facility] ENDOSCOPY;  Service: Cardiopulmonary;;  . BRONCHIAL BRUSHINGS  02/18/2020   Procedure: BRONCHIAL BRUSHINGS;  Surgeon: Collene Gobble, MD;  Location: [place];  Service: Cardiopulmonary;;  . BRONCHIAL NEEDLE ASPIRATION BIOPSY  02/18/2020   Procedure: BRONCHIAL NEEDLE ASPIRATION BIOPSIES;  Surgeon: Collene Gobble, MD;  Location: [place];  Service: Cardiopulmonary;;  . BRONCHIAL WASHINGS  02/18/2020   Procedure: BRONCHIAL WASHINGS;  Surgeon: Collene Gobble, MD;  Location: [place];  Service: Cardiopulmonary;;  . CARDIOVERSION N/A 01/09/2018   Procedure: CARDIOVERSION;  Surgeon: Josue Hector, MD;  Location: [facility] ENDOSCOPY;  Service: Cardiovascular;  Laterality: N/A;  . CARDIOVERSION N/A 03/01/2018   Procedure: CARDIOVERSION;  Surgeon: Sueanne Margarita, MD;  Location: [facility] ENDOSCOPY;  Service: Cardiovascular;  Laterality: N/A;  . ELECTROMAGNETIC NAVIGATION BROCHOSCOPY N/A 02/18/2020   Procedure: ELECTROMAGNETIC NAVIGATION BRONCHOSCOPY;  Surgeon: Collene Gobble, MD;  Location: [place];  Service: Cardiopulmonary;  Laterality: N/A;  . KNEE SURGERY    . LUNG LOBECTOMY Right 2000   RLL removed  . VIDEO BRONCHOSCOPY N/A 02/18/2020   Procedure: VIDEO BRONCHOSCOPY WITH FLUORO;  Surgeon: Collene Gobble, MD;  Location: [facility] ENDOSCOPY;  Service: Cardiopulmonary;  Laterality: N/A;  .  VIDEO BRONCHOSCOPY WITH ENDOBRONCHIAL ULTRASOUND N/A 04/22/2020   Procedure: VIDEO BRONCHOSCOPY WITH ENDOBRONCHIAL ULTRASOUND;  Surgeon: [name], MD;  Location: [facility] OR;  Service: Thoracic;  Laterality: N/A;   Family History History reviewed. No pertinent family history. Social History  reports that she has quit smoking. She has never used smokeless tobacco. She reports that she does not drink alcohol and does not use drugs. Allergies  Allergies  Allergen Reactions  . Morphine Anaphylaxis  . Morphine And Related Anaphylaxis     Vitals drop down and pass out  . Morphine And Related Other (See Comments)    "vitals go down", pass out  . Sulfa Antibiotics Rash  . Sulfa Antibiotics Rash   Home medications Prior to Admission medications   Medication Sig Start Date End Date Taking? Authorizing Provider  conjugated estrogens (PREMARIN) vaginal cream Place 1 Applicatorful vaginally daily.   Yes [provider]  furosemide (LASIX) 40 MG tablet Take 40 mg by mouth daily.   Yes [provider]  losartan (COZAAR) 50 MG tablet Take 50 mg by mouth daily.   Yes [provider]  [employer] VITAMIN B12 1000 MCG tablet Take 1,000 mcg by mouth daily. 02/20/20   [provider]  diclofenac Sodium (VOLTAREN) 1 % GEL Apply 4 g topically 4 (four) times daily. To bilateral knees and ankles 04/24/20   Reed, Tiffany L, DO  levocetirizine (XYZAL) 5 MG tablet Take 5 mg by mouth daily. 01/28/20   [provider]  loratadine (CLARITIN) 10 MG tablet One daily prior to pegfilgrastim & for 4-10 days if needed for bone pain.  Sent to [facility].  May take with Tylenol. 05/08/20   Heilingoetter, Cassandra L, PA-C  losartan (COZAAR) 25 MG tablet Take 25 mg by mouth daily.    [provider]  Magnesium Hydroxide (BL MILK OF MAGNESIA PO) Take by mouth. If no BM in 3 day, give 30 cc Milk of magnesium p.o. x 1 dose in 24 hours as needed    [provider]  metoprolol succinate (TOPROL-XL) 25 MG 24 hr tablet Take 12.5 mg by mouth at bedtime.  12/18/19   [provider]  oxybutynin (DITROPAN XL) 5 MG 24 hr tablet Take 1 tablet (5 mg total) by mouth at bedtime. 04/22/20   Iona Beard, MD  prochlorperazine (COMPAZINE) 10 MG tablet One tab every 6 hours prn nausea-Sent to [place] facility on pt's paperwork per Cassie H PA. 05/08/20   Heilingoetter, Cassandra L, PA-C  simvastatin (ZOCOR) 20 MG tablet Take 20 mg by mouth at bedtime. 01/28/20   [provider]  timolol (TIMOPTIC)  0.5 % ophthalmic solution Place 1 drop into both eyes at bedtime. 11/03/19   [provider]  vitamin B-12 1000 MCG tablet Take 1 tablet (1,000 mcg total) by mouth daily. 02/20/20   Andrew Au, MD  warfarin (COUMADIN) 2.5 MG tablet Take 2.5 mg by mouth See admin instructions. On Tue-Wed-Thu-Sat-Sun    [provider]  warfarin (COUMADIN) 5 MG tablet Take 5 mg by mouth See admin instructions. Monday and Friday    [provider]  zolpidem (AMBIEN) 5 MG tablet Take 1 tablet (5 mg total) by mouth at bedtime as needed for sleep. 04/23/20 05/23/20  Iona Beard, MD       Exam Gen sig obese, WF, lying flat, Ox 1, knows hospital, doesn't know place ([place]) or year  No rash, cyanosis or gangrene Sclera anicteric, throat clear  and moist  No jvd or bruits. Flat neck veins Chest clear bilat occ crackles clear w/ deep breaths Cor irreg irreg, no mrg Abd soft ntnd no mass or ascites +bs markedly obese GU defer MS bilat vertical knee scarring from prior TKA's, no joint effusions or deformity Ext no pitting edema, no wounds or ulcers, feet are cool to touch Neuro is nonfocal, no motor deficits, gen weakness overall    Home meds:  - losartan 50 qd/ lasix 40 qd/ metoprolol xl 12.5 hs/ zocor 20 hs  - coumadin 5mg  on Mon + Fri/ coumadin 2.5 mg on tue-wed-thu-sat-Sun  - prn's/ vitamins/ supplements        CT head > IMPRESSION: 1. Stable chronic small vessel ischemic changes. No acute intracranial process.    EKG > atrial fib, 60 bpm, no acute   Na 133 K 3.5 BN 13  Cr 0.77  Glu 124  CO2 26  Alb 3.3  LFT"s ok     WBC 8.4K  Hb 15  plt 182      INR 2.4      UA cloudy, >50 wbc, many bact, 11-20 rbc   UDS negative   COVID - pending  Assessment/ Plan: 1. Altered mental status - w/ somnolence and disorientation in elderly female who started chemoRx today for lung Ca. Multifactorial had sig low BP's at cancer center and evidence of acute UTI in ED, also chemo premed side  effect and underlying memory issues recently reportedly per family.  BP's better here in ED and pt doesn't appear septic. Plan admit, IVF's for dehydration and IV rocephin for UTI, sent urine culture and avoid sedating meds. COVID pending.   2. Lung cancer - sp 1st chemoRx earlier today as outpatient.  3. HTN - hold losartan/ lasix for now, cont BB 4. Atrial fib - INR therapeutic, no evidence bleeding, cont coumadin, BB. HR controlled.       Kelly Splinter  MD 05/11/2020, 9:20 PM

## 2020-05-12 ENCOUNTER — Ambulatory Visit: Payer: Medicare HMO

## 2020-05-12 ENCOUNTER — Other Ambulatory Visit: Payer: Self-pay

## 2020-05-12 DIAGNOSIS — Z79899 Other long term (current) drug therapy: Secondary | ICD-10-CM | POA: Diagnosis not present

## 2020-05-12 DIAGNOSIS — M069 Rheumatoid arthritis, unspecified: Secondary | ICD-10-CM | POA: Diagnosis present

## 2020-05-12 DIAGNOSIS — N3000 Acute cystitis without hematuria: Secondary | ICD-10-CM | POA: Diagnosis present

## 2020-05-12 DIAGNOSIS — Z885 Allergy status to narcotic agent status: Secondary | ICD-10-CM | POA: Diagnosis not present

## 2020-05-12 DIAGNOSIS — C3411 Malignant neoplasm of upper lobe, right bronchus or lung: Secondary | ICD-10-CM | POA: Diagnosis present

## 2020-05-12 DIAGNOSIS — E871 Hypo-osmolality and hyponatremia: Secondary | ICD-10-CM | POA: Diagnosis present

## 2020-05-12 DIAGNOSIS — R410 Disorientation, unspecified: Secondary | ICD-10-CM | POA: Diagnosis not present

## 2020-05-12 DIAGNOSIS — E669 Obesity, unspecified: Secondary | ICD-10-CM | POA: Diagnosis present

## 2020-05-12 DIAGNOSIS — R413 Other amnesia: Secondary | ICD-10-CM | POA: Diagnosis present

## 2020-05-12 DIAGNOSIS — K219 Gastro-esophageal reflux disease without esophagitis: Secondary | ICD-10-CM | POA: Diagnosis present

## 2020-05-12 DIAGNOSIS — I4819 Other persistent atrial fibrillation: Secondary | ICD-10-CM | POA: Diagnosis present

## 2020-05-12 DIAGNOSIS — Z20822 Contact with and (suspected) exposure to covid-19: Secondary | ICD-10-CM | POA: Diagnosis present

## 2020-05-12 DIAGNOSIS — R296 Repeated falls: Secondary | ICD-10-CM | POA: Diagnosis present

## 2020-05-12 DIAGNOSIS — E785 Hyperlipidemia, unspecified: Secondary | ICD-10-CM | POA: Diagnosis present

## 2020-05-12 DIAGNOSIS — Z85118 Personal history of other malignant neoplasm of bronchus and lung: Secondary | ICD-10-CM | POA: Diagnosis not present

## 2020-05-12 DIAGNOSIS — I9589 Other hypotension: Secondary | ICD-10-CM | POA: Diagnosis present

## 2020-05-12 DIAGNOSIS — Z6841 Body Mass Index (BMI) 40.0 and over, adult: Secondary | ICD-10-CM | POA: Diagnosis not present

## 2020-05-12 DIAGNOSIS — Z882 Allergy status to sulfonamides status: Secondary | ICD-10-CM | POA: Diagnosis not present

## 2020-05-12 DIAGNOSIS — Z7901 Long term (current) use of anticoagulants: Secondary | ICD-10-CM | POA: Diagnosis not present

## 2020-05-12 DIAGNOSIS — Z87891 Personal history of nicotine dependence: Secondary | ICD-10-CM | POA: Diagnosis not present

## 2020-05-12 DIAGNOSIS — I10 Essential (primary) hypertension: Secondary | ICD-10-CM | POA: Diagnosis present

## 2020-05-12 DIAGNOSIS — E861 Hypovolemia: Secondary | ICD-10-CM | POA: Diagnosis present

## 2020-05-12 DIAGNOSIS — R4182 Altered mental status, unspecified: Secondary | ICD-10-CM | POA: Diagnosis present

## 2020-05-12 LAB — CBC
HCT: 53.7 % — ABNORMAL HIGH (ref 36.0–46.0)
Hemoglobin: 17.3 g/dL — ABNORMAL HIGH (ref 12.0–15.0)
MCH: 30.8 pg (ref 26.0–34.0)
MCHC: 32.2 g/dL (ref 30.0–36.0)
MCV: 95.6 fL (ref 80.0–100.0)
Platelets: 129 10*3/uL — ABNORMAL LOW (ref 150–400)
RBC: 5.62 MIL/uL — ABNORMAL HIGH (ref 3.87–5.11)
RDW: 14.6 % (ref 11.5–15.5)
WBC: 9 10*3/uL (ref 4.0–10.5)
nRBC: 0 % (ref 0.0–0.2)

## 2020-05-12 LAB — BASIC METABOLIC PANEL
Anion gap: 9 (ref 5–15)
BUN: 14 mg/dL (ref 8–23)
CO2: 24 mmol/L (ref 22–32)
Calcium: 8.9 mg/dL (ref 8.9–10.3)
Chloride: 102 mmol/L (ref 98–111)
Creatinine, Ser: 0.63 mg/dL (ref 0.44–1.00)
GFR calc Af Amer: >60 mL/min (ref >60)
GFR calc non Af Amer: >60 mL/min (ref >60)
Glucose, Bld: 149 mg/dL — ABNORMAL HIGH (ref 70–99)
Potassium: 4.1 mmol/L (ref 3.5–5.1)
Sodium: 135 mmol/L (ref 135–145)

## 2020-05-12 LAB — PROTIME-INR
INR: 5.5 (ref 0.8–1.2)
Prothrombin Time: 48.2 seconds — ABNORMAL HIGH (ref 11.4–15.2)

## 2020-05-12 MED ORDER — VITAMIN B-12 1000 MCG PO TABS
1000.0000 ug | ORAL_TABLET | Freq: Every day | ORAL | Status: DC
  Administered 2020-05-13: 1000 ug via ORAL
  Filled 2020-05-12: qty 1

## 2020-05-12 MED ORDER — BISACODYL 10 MG RE SUPP: 10.0000 mg | Freq: Every day | RECTAL | Status: DC | PRN

## 2020-05-12 MED ORDER — ACETAMINOPHEN 325 MG PO TABS: 650.0000 mg | ORAL_TABLET | Freq: Four times a day (QID) | ORAL | Status: DC | PRN

## 2020-05-12 MED ORDER — ACETAMINOPHEN 650 MG RE SUPP: 650.0000 mg | Freq: Four times a day (QID) | RECTAL | Status: DC | PRN

## 2020-05-12 MED ORDER — WARFARIN SODIUM 2.5 MG PO TABS: 2.5000 mg | ORAL_TABLET | ORAL | Status: DC

## 2020-05-12 NOTE — ED Notes (Signed)
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

## 2020-05-12 NOTE — Progress Notes (Signed)
PROGRESS NOTE    Leah Velasquez  TKW:[id number] DOB: 1940-09-27 DOA: 05/11/2020 PCP: [name], MD    Brief Narrative: 79 year old female lives in [place] fall was admitted from cancer center where she went for her first chemo treatment for newly diagnosed small cell lung CA.  She has history of rheumatoid arthritis atrial fibrillation hypertension and hyperlipidemia.  She was admitted with change in mental status.  Her baseline apparently she has memory issues but she is awake and alert and able to speak.  Assessment & Plan:   Principal Problem:   Altered mental status Active Problems:   Persistent atrial fibrillation (HCC)   Generalized weakness   Essential hypertension   Long term (current) use of anticoagulants   Small cell lung cancer, right upper lobe (HCC)   Hypotension due to hypovolemia   History of memory loss   UTI (urinary tract infection) with pyuria   #1acute change in MS be multifactorial she was very hypotensive on admission with a blood pressure of 73/54 and 84/61, question related to prechemo drugs--compazine? possible UTI with UA appears dirty urine culture pending.  Today she is awake she still does not know where she is however she does know she lives in [place]. Patient was tired and lethargic on arrival to chemo per daughter. Family reports she has been more confused last week. Patient with minimal p.o. intake we will continue slow IV fluids. Physical therapy consult.  #2  Hyponatremia improved sodium 133  #3 hypotension improved on IV fluids, her blood pressure is 140/68. Restart beta-blocker.  #4 hyperlipidemia continue Zocor. Estimated body mass index is 43.32 kg/m as calculated from the following:   Height as of this encounter: 5\' 1"  (1.549 m).   Weight as of this encounter: 104 kg.  DVT prophylaxis: Coumadin Code Status: Full code Family Communication: Discussed with daughter [name] Disposition Plan:  Status is: Observation   Dispo: The  patient is from: SNF              Anticipated d/c is to: SNF              Anticipated d/c date is: 1 day              Patient currently is not medically stable to d/c.  Patient with minimal p.o. intake on IV fluids.   Consultants:   Oncology  Procedures none Antimicrobials: Rocephin  Subjective: Patient resting in bed confused as to where she is  She does know she lives in [place] home Daughter reported she has been living in [place] form for 24 days prior to that she lived with her husband of 71 years old  Objective: Vitals:   05/12/20 0730 05/12/20 0800 05/12/20 0830 05/12/20 0920  BP: (!) 159/81 130/90 (!) 137/113 131/78  Pulse: 74 69 72 73  Resp: 19 19 20 16   Temp:      TempSrc:      SpO2: 94% 97% 100% 98%  Weight:      Height:        Intake/Output Summary (Last 24 hours) at 05/12/2020 0943 Last data filed at 05/12/2020 0741 Gross per 24 hour  Intake 1000 ml  Output --  Net 1000 ml   Filed Weights   05/11/20 1721  Weight: 104 kg    Examination:  General exam: Appears calm and comfortable  Respiratory system: Clear to auscultation. Respiratory effort normal. Cardiovascular system: S1 & S2 heard, RRR. No JVD, murmurs, rubs, gallops or clicks. No  pedal edema. Gastrointestinal system: Abdomen is nondistended, soft and nontender. No organomegaly or masses felt. Normal bowel sounds heard. Central nervous system: She is awake not oriented to the hospital time or place. No focal neurological deficits.  Extremities: Symmetric 5 x 5 power. Skin: No rashes, lesions or ulcers Psychiatry: Judgement and insight appear normal. Mood & affect appropriate.     Data Reviewed: I have personally reviewed following labs and imaging studies  CBC: Recent Labs  Lab 05/11/20 1313 05/11/20 1747  WBC 5.8 8.4  NEUTROABS 3.0 6.9  HGB 14.7 15.0  HCT 43.6 44.9  MCV 92.6 94.7  PLT 182 852   Basic Metabolic Panel: Recent Labs  Lab 05/11/20 1313 05/11/20 1747  NA 131* 133*  K  3.4* 3.5  CL 96* 95*  CO2 27 26  GLUCOSE 114* 124*  BUN 11 13  CREATININE 0.70 0.77  CALCIUM 9.7 9.1   GFR: Estimated Creatinine Clearance: 65.4 mL/min (by C-G formula based on SCr of 0.77 mg/dL). Liver Function Tests: Recent Labs  Lab 05/11/20 1313 05/11/20 1747  AST 27 29  ALT 25 27  ALKPHOS 93 78  BILITOT 0.9 1.0  PROT 6.7 6.9  ALBUMIN 2.9* 3.3*   No results for input(s): LIPASE, AMYLASE in the last 168 hours. No results for input(s): AMMONIA in the last 168 hours. Coagulation Profile: Recent Labs  Lab 05/11/20 1747  INR 2.4*   Cardiac Enzymes: No results for input(s): CKTOTAL, CKMB, CKMBINDEX, TROPONINI in the last 168 hours. BNP (last 3 results) No results for input(s): PROBNP in the last 8760 hours. HbA1C: No results for input(s): HGBA1C in the last 72 hours. CBG: No results for input(s): GLUCAP in the last 168 hours. Lipid Profile: No results for input(s): CHOL, HDL, LDLCALC, TRIG, CHOLHDL, LDLDIRECT in the last 72 hours. Thyroid Function Tests: No results for input(s): TSH, T4TOTAL, FREET4, T3FREE, THYROIDAB in the last 72 hours. Anemia Panel: No results for input(s): VITAMINB12, FOLATE, FERRITIN, TIBC, IRON, RETICCTPCT in the last 72 hours. Sepsis Labs: No results for input(s): PROCALCITON, LATICACIDVEN in the last 168 hours.  Recent Results (from the past 240 hour(s))  SARS Coronavirus 2 by RT PCR (hospital order, performed in [facility] hospital lab) Nasopharyngeal Nasopharyngeal Swab     Status: None   Collection Time: 05/11/20  9:50 PM   Specimen: Nasopharyngeal Swab  Result Value Ref Range Status   SARS Coronavirus 2 NEGATIVE NEGATIVE Final    Comment: (NOTE) SARS-CoV-2 target nucleic acids are NOT DETECTED.  The SARS-CoV-2 RNA is generally detectable in upper and lower respiratory specimens during the acute phase of infection. The lowest concentration of SARS-CoV-2 viral copies this assay can detect is 250 copies / mL. A negative result does  not preclude SARS-CoV-2 infection and should not be used as the sole basis for treatment or other patient management decisions.  A negative result may occur with improper specimen collection / handling, submission of specimen other than nasopharyngeal swab, presence of viral mutation(s) within the areas targeted by this assay, and inadequate number of viral copies (<250 copies / mL). A negative result must be combined with clinical observations, patient history, and epidemiological information.  Fact Sheet for Patients:   StrictlyIdeas.no  Fact Sheet for Healthcare Providers: BankingDealers.co.za  This test is not yet approved or  cleared by the [place] FDA and has been authorized for detection and/or diagnosis of SARS-CoV-2 by FDA under an Emergency Use Authorization (EUA).  This EUA will remain in effect (meaning this  test can be used) for the duration of the COVID-19 declaration under Section 564(b)(1) of the Act, 21 U.S.C. section 360bbb-3(b)(1), unless the authorization is terminated or revoked sooner.  Performed at [facility], [place] [address], [place], [place] [address]          Radiology Studies: CT Head Wo Contrast  Result Date: 05/11/2020 CLINICAL DATA:  Somnolent, altered level of consciousness, right upper lobe non-small cell lung cancer EXAM: CT HEAD WITHOUT CONTRAST TECHNIQUE: Contiguous axial images were obtained from the base of the skull through the vertex without intravenous contrast. COMPARISON:  02/07/2020, 02/05/2020 FINDINGS: Brain: Chronic small vessel ischemic changes are seen throughout the periventricular and subcortical white matter, stable. 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 No signs of acute infarct or hemorrhage. Lateral ventricles and midline structures are otherwise unremarkable. No acute extra-axial fluid collections. No mass  effect. Vascular: No hyperdense vessel or unexpected calcification. Skull: Normal. Negative for fracture or focal lesion. Sinuses/Orbits: No acute finding. Other: None. IMPRESSION: 1. Stable chronic small vessel ischemic changes. No acute intracranial process. Electronically Signed   By: Randa Ngo M.D.   On: 05/11/2020 18:44        Scheduled Meds: . metoprolol succinate  12.5 mg Oral Daily  . simvastatin  20 mg Oral QHS  . timolol  1 drop Both Eyes Daily  . warfarin  5 mg Oral See admin instructions   Continuous Infusions: . 0.9 % NaCl with KCl 40 mEq / L 75 mL/hr at 05/12/20 0747  . cefTRIAXone (ROCEPHIN)  IV       LOS: 0 days     Georgette Shell, MD 05/12/2020, 9:43 AM

## 2020-05-12 NOTE — Progress Notes (Signed)
PT Cancellation Note  Patient Details Name: Leah Velasquez MRN: [id number] DOB: 1941/02/15   Cancelled Treatment:    Reason Eval/Treat Not Completed:  (Patient INR(5.5) and Prothrombin elevated and remains weak and fatigeud per RN. Pt also with bed request and will be admitted to unit. Will hold and follow up at later date/time as medically ready.)   Verner Mould, DPT Acute Rehabilitation Services  Office [phone] Pager [phone]  05/12/2020 12:33 PM

## 2020-05-12 NOTE — Progress Notes (Signed)
[facility] for warfarin Indication: hx atrial fibrillation  Allergies  Allergen Reactions  . Morphine Anaphylaxis  . Morphine And Related Anaphylaxis    Vitals drop down and pass out  . Morphine And Related Other (See Comments)    "vitals go down", pass out  . Sulfa Antibiotics Rash  . Sulfa Antibiotics Rash    Patient Measurements: Height: 5\' 1"  (154.9 cm) Weight: 104 kg (229 lb 4.5 oz) IBW/kg (Calculated) : 47.8 Heparin Dosing Weight:   Vital Signs: BP: 137/113 (09/21 0830) Pulse Rate: 72 (09/21 0830)  Labs: Recent Labs    05/11/20 1313 05/11/20 1747  HGB 14.7 15.0  HCT 43.6 44.9  PLT 182 182  LABPROT  --  25.4*  INR  --  2.4*  CREATININE 0.70 0.77    Estimated Creatinine Clearance: 65.4 mL/min (by C-G formula based on SCr of 0.77 mg/dL).   Medications:  PTA warfarin regimen: 2.5 mg daily except 5 mg on Monday and Fri --> last dose taken on 9/19 at facility   Assessment: Patient's a 79 y.o F with NSCLC currently undergoing chemotherapy treatment and hx of afib on warfarin PTA, presented to the ED on 9/20 with AMS.  Pharmacy is consulted to resume warfarin on admission.  Today, 05/12/2020: - INR is supra-therapeutic at 5.5 - cbc stable - per pt's RN, no bleeding noted - drug-drug intxns: being on abx can make pt more sensitive to warfarin (currently on ceftriaxone)   Goal of Therapy:  INR 2-3 Monitor platelets by anticoagulation protocol: Yes   Plan:  - hold warfarin today - daily INR - monitor for s/sx bleeding  Lulia Schriner P 05/12/2020,8:48 AM

## 2020-05-12 NOTE — Progress Notes (Signed)
Received report from ED nurse. Patient to arrive on unit shortly.

## 2020-05-12 NOTE — Progress Notes (Signed)
Symptoms Management Clinic Progress Note   Leah Velasquez [id number] 07-23-1941 79 y.o.  Leah Velasquez is managed by Dr. Fanny Bien. Leah Velasquez  Actively treated with chemotherapy/immunotherapy/hormonal therapy: yes  Current therapy: carboplatin and etoposide  Last treated: 05/11/2020 (cycle #1, day #1)  Next scheduled appointment with provider: 05/18/2020  Assessment: Plan:    Altered mental status, unspecified altered mental status type  Small cell lung cancer, right upper lobe (HCC)   Altered mental status: The patient was given Narcan 0.4 mg IV x 2 with improvement in her mentation. She developed recurrent altered mental status with Rapid Response called. She did not respond to painful stimuli and was aroused with exposure to an ammonia ampule. She was taken to the ER for evaluation and management.   Limited stage small cell lung cancer: Leah Velasquez presented for cycle #1, day #1 of carboplatin and etoposide with the patient only completing her carboplatin infusion before being taken to the ER for evaluation and management.  Please see After Visit Summary for patient specific instructions.  Future Appointments  Date Time Provider [place]  05/15/2020  2:30 PM [facility] [place] FL[facility] None  05/18/2020  2:45 PM [facility] LAB [facility] None  05/18/2020  3:15 PM Curt Bears, MD [facility] None  05/25/2020  2:30 PM [facility] LAB [facility] None  05/26/2020  4:30 PM Lorretta Harp, MD [facility]  06/01/2020 10:00 AM [facility] LAB [facility] None  06/01/2020 10:30 AM Heilingoetter, Cassandra L, PA-C [facility] None  06/01/2020 11:30 AM [facility] INFUSION [facility] None  06/02/2020  2:00 PM [facility] INFUSION [facility] None  06/03/2020  2:30 PM [facility] INFUSION [facility] None  06/05/2020  2:30 PM [facility] [place] FLUSH [facility] None    No orders of the defined types were placed in this encounter.      Subjective:    Patient ID:  Leah Velasquez is a 80 y.o. (DOB 1940/10/24) female.  Chief Complaint: No chief complaint on file.   HPI LOYE REININGER  is a 79 y.o. female with a diagnosis of a limited stage small cell lung cancer. She is followed by Dr. Julien Nordmann and presented for cycle #1, day #1 of carboplatin and etoposide. She was seen in infusion when she was noted to have altered mental status and was only minimally arousable with painful stimuli. She was given Narcan 0.4 mg IV x 1 with very little improvement in her condition. She was given an additional 0.4 mg dose of Narcan IV with Leah Velasquez opening her eyes and responding to questions appropriately. Her blood pressure was noted to be low at 81/57. The patient's daughter was brought back to be with the patient.  There was no notation that the patient had received any medications other than Voltaren Gel, Lasix, vitamin B-12 and Compazine this morning. The patient denies taking any of her own medications. She again developed recurrent altered mental status with Rapid Response called. She did not respond to painful stimuli and was aroused with exposure to an ammonia ampule. She was taken to the ER for evaluation and management. [facility] was contacted. This provider spoke with Leah Velasquez who is the patient's primary Leah, the Assistant Nursing Supervisor and Leah Velasquez who is the Director of Nursing. They all reported that the patient was given no opioids there despite her response to Narcan. They all suggested that the patient may have taken her own medications that may be in her possession. They cannot check her room without her permission or  the permission of her daughter. Leah Velasquez reported that she will ask [name]'s daughter if they can check her room.    Medications: I have reviewed the patient's current medications.  Allergies:  Allergies  Allergen Reactions  . Morphine Anaphylaxis  . Morphine And Related Anaphylaxis    Vitals drop down  and pass out  . Morphine And Related Other (See Comments)    "vitals go down", pass out  . Sulfa Antibiotics Rash  . Sulfa Antibiotics Rash    Past Medical History:  Diagnosis Date  . A-fib ([place])   . Dysrhythmia   . GERD (gastroesophageal reflux disease)   . Hemorrhoids   . Hyperlipidemia   . Hypertension   . Lung cancer ([place]) 2000   RLL  . Obesity   . Persistent atrial fibrillation ([place])   . Rheumatoid arthritis [facility])     Past Surgical History:  Procedure Laterality Date  . BREAST EXCISIONAL BIOPSY Left    x 2  . BREAST EXCISIONAL BIOPSY Right   . BRONCHIAL BIOPSY  02/18/2020   Procedure: BRONCHIAL BIOPSIES;  Surgeon: Collene Gobble, MD;  Location[facility] ENDOSCOPY;  Service: Cardiopulmonary;;  . BRONCHIAL BRUSHINGS  02/18/2020   Procedure: BRONCHIAL BRUSHINGS;  Surgeon: Collene Gobble, MD;  Location: [place];  Service: Cardiopulmonary;;  . BRONCHIAL NEEDLE ASPIRATION BIOPSY  02/18/2020   Procedure: BRONCHIAL NEEDLE ASPIRATION BIOPSIES;  Surgeon: Collene Gobble, MD;  Location: [place];  Service: Cardiopulmonary;;  . BRONCHIAL WASHINGS  02/18/2020   Procedure: BRONCHIAL WASHINGS;  Surgeon: Collene Gobble, MD;  Location: [place];  Service: Cardiopulmonary;;  . CARDIOVERSION N/A 01/09/2018   Procedure: CARDIOVERSION;  Surgeon: Josue Hector, MD;  Location: [facility] ENDOSCOPY;  Service: Cardiovascular;  Laterality: N/A;  . CARDIOVERSION N/A 03/01/2018   Procedure: CARDIOVERSION;  Surgeon: Sueanne Margarita, MD;  Location: [facility] ENDOSCOPY;  Service: Cardiovascular;  Laterality: N/A;  . ELECTROMAGNETIC NAVIGATION BROCHOSCOPY N/A 02/18/2020   Procedure: ELECTROMAGNETIC NAVIGATION BRONCHOSCOPY;  Surgeon: Collene Gobble, MD;  Location: [place];  Service: Cardiopulmonary;  Laterality: N/A;  . KNEE SURGERY    . LUNG LOBECTOMY Right 2000   RLL removed  . VIDEO BRONCHOSCOPY N/A 02/18/2020   Procedure: VIDEO BRONCHOSCOPY WITH FLUORO;  Surgeon: Collene Gobble, MD;  Location: [facility]  ENDOSCOPY;  Service: Cardiopulmonary;  Laterality: N/A;  . VIDEO BRONCHOSCOPY WITH ENDOBRONCHIAL ULTRASOUND N/A 04/22/2020   Procedure: VIDEO BRONCHOSCOPY WITH ENDOBRONCHIAL ULTRASOUND;  Surgeon: [name], MD;  Location: [place];  Service: Thoracic;  Laterality: N/A;    No family history on file.  Social History   Socioeconomic History  . Marital status: Married    Spouse name: Not on file  . Number of children: Not on file  . Years of education: Not on file  . Highest education level: Not on file  Occupational History  . Not on file  Tobacco Use  . Smoking status: Former Research scientist (life sciences)  . Smokeless tobacco: Never Used  Vaping Use  . Vaping Use: Never used  Substance and Sexual Activity  . Alcohol use: No  . Drug use: No  . Sexual activity: Not on file  Other Topics Concern  . Not on file  Social History Narrative   ** Merged History Encounter **       ** Merged History Encounter **       Social Determinants of Health   Financial Resource Strain:   . Difficulty of Paying Living Expenses: Not on file  Food Insecurity:   .  Worried About Charity fundraiser in the Last Year: Not on file  . Ran Out of Food in the Last Year: Not on file  Transportation Needs:   . Lack of Transportation (Medical): Not on file  . Lack of Transportation (Non-Medical): Not on file  Physical Activity:   . Days of Exercise per Week: Not on file  . Minutes of Exercise per Session: Not on file  Stress:   . Feeling of Stress : Not on file  Social Connections:   . Frequency of Communication with Friends and Family: Not on file  . Frequency of Social Gatherings with Friends and Family: Not on file  . Attends Religious Services: Not on file  . Active Member of Clubs or Organizations: Not on file  . Attends Archivist Meetings: Not on file  . Marital Status: Not on file  Intimate Partner Violence:   . Fear of Current or Ex-Partner: Not on file  . Emotionally Abused: Not on file  .  Physically Abused: Not on file  . Sexually Abused: Not on file    Past Medical History, Surgical history, Social history, and Family history were reviewed and updated as appropriate.   Please see review of systems for further details on the patient's review from today.   Review of Systems:  Review of Systems  Constitutional: Negative for chills, diaphoresis and fever.  Psychiatric/Behavioral: Positive for confusion and decreased concentration.    Objective:   Physical Exam:  There were no vitals taken for this visit. ECOG: 1  Physical Exam HENT:     Head: Normocephalic and atraumatic.  Eyes:     General: No scleral icterus.       Right eye: No discharge.        Left eye: No discharge.     Conjunctiva/sclera: Conjunctivae normal.  Cardiovascular:     Rate and Rhythm: Normal rate and regular rhythm.  Pulmonary:     Effort: Pulmonary effort is normal. No respiratory distress.     Breath sounds: No wheezing, rhonchi or rales.  Neurological:     Mental Status: She is confused and unresponsive.     Cranial Nerves: No facial asymmetry.     Gait: Gait abnormal (ambulating with a wheelchair).     Lab Review:     Component Value Date/Time   NA 133 (L) 05/11/2020 1747   NA 136 (A) 04/30/2020 0000   K 3.5 05/11/2020 1747   CL 95 (L) 05/11/2020 1747   CO2 26 05/11/2020 1747   GLUCOSE 124 (H) 05/11/2020 1747   BUN 13 05/11/2020 1747   BUN 19 04/30/2020 0000   CREATININE 0.77 05/11/2020 1747   CREATININE 0.70 05/11/2020 1313   CALCIUM 9.1 05/11/2020 1747   PROT 6.9 05/11/2020 1747   ALBUMIN 3.3 (L) 05/11/2020 1747   AST 29 05/11/2020 1747   AST 27 05/11/2020 1313   ALT 27 05/11/2020 1747   ALT 25 05/11/2020 1313   ALKPHOS 78 05/11/2020 1747   BILITOT 1.0 05/11/2020 1747   BILITOT 0.9 05/11/2020 1313   GFRNONAA >60 05/11/2020 1747   GFRNONAA >60 05/11/2020 1313   GFRAA >60 05/11/2020 1747   GFRAA >60 05/11/2020 1313       Component Value Date/Time   WBC 8.4  05/11/2020 1747   RBC 4.74 05/11/2020 1747   HGB 15.0 05/11/2020 1747   HGB 14.7 05/11/2020 1313   HGB 13.7 12/26/2017 1145   HCT 44.9 05/11/2020 1747   HCT 40.0 12/26/2017  1145   PLT 182 05/11/2020 1747   PLT 182 05/11/2020 1313   PLT 204 12/26/2017 1145   MCV 94.7 05/11/2020 1747   MCV 95 12/26/2017 1145   MCH 31.6 05/11/2020 1747   MCHC 33.4 05/11/2020 1747   RDW 14.6 05/11/2020 1747   RDW 14.1 12/26/2017 1145   LYMPHSABS 0.8 05/11/2020 1747   LYMPHSABS 1.4 12/26/2017 1145   MONOABS 0.5 05/11/2020 1747   EOSABS 0.1 05/11/2020 1747   EOSABS 0.2 12/26/2017 1145   BASOSABS 0.1 05/11/2020 1747   BASOSABS 0.0 12/26/2017 1145   -------------------------------  Imaging from last 24 hours (if applicable):  Radiology interpretation: DG Chest 2 View  Result Date: 04/16/2020 CLINICAL DATA:  [place] last night.  Low back pain. EXAM: CHEST - 2 VIEW COMPARISON:  Multiple priors, including chest radiograph from 02/20/2020 and PET-CT from 03/10/2020 FINDINGS: Prominence of the right paratracheal stripe with outwardly convex contour, likely secondary to known paramediastinal and right upper lobe lesions, better characterized on prior PET-CT. New diffuse interstitial prominence. Low lung volumes without focal consolidation. No discernible pneumothorax. No definite pleural effusions. Mild-to-moderate enlargement of the cardiopericardial silhouette, increased from prior. Polyarticular degenerative change. Calcific atherosclerosis of the aorta. Multiple remote right rib fractures without evidence of acute osseous abnormality. IMPRESSION: 1. Cardiomegaly with new diffuse interstitial prominence, suggestive of mild interstitial pulmonary edema. 2. Prominence of the right paratracheal stripe, likely secondary to known paramediastinal and right upper lobe lesions, better characterized on prior PET-CT. Electronically Signed   By: Margaretha Sheffield MD   On: 04/16/2020 07:59   DG Lumbar Spine Complete  Result  Date: 04/16/2020 CLINICAL DATA:  Left low back pain.  Fall. EXAM: LUMBAR SPINE - COMPLETE 4+ VIEW COMPARISON:  MRI lumbar spine 02/14/2020 FINDINGS: No definite vertebral body height loss when accounting for obliquity. Minimal anterolisthesis (grade 1) of L4 on L5 and L5 on S1, similar to prior. No new subluxation. Mild dextrocurvature. Mild-to-moderate degenerative changes, more pronounced in the lower lumbar spine and better characterized on prior MRI of the lumbar spine. Calcific aortic atherosclerosis. IMPRESSION: No radiographic evidence of acute fracture or traumatic malalignment. Electronically Signed   By: Margaretha Sheffield MD   On: 04/16/2020 08:04   CT Head Wo Contrast  Result Date: 05/11/2020 CLINICAL DATA:  Somnolent, altered level of consciousness, right upper lobe non-small cell lung cancer EXAM: CT HEAD WITHOUT CONTRAST TECHNIQUE: Contiguous axial images were obtained from the base of the skull through the vertex without intravenous contrast. COMPARISON:  02/07/2020, 02/05/2020 FINDINGS: Brain: Chronic small vessel ischemic changes are seen throughout the periventricular and subcortical white matter, stable. 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 No signs of acute infarct or hemorrhage. Lateral ventricles and midline structures are otherwise unremarkable. No acute extra-axial fluid collections. No mass effect. Vascular: No hyperdense vessel or unexpected calcification. Skull: Normal. Negative for fracture or focal lesion. Sinuses/Orbits: No acute finding. Other: None. IMPRESSION: 1. Stable chronic small vessel ischemic changes. No acute intracranial process. Electronically Signed   By: Randa Ngo M.D.   On: 05/11/2020 18:44   DG C-ARM BRONCHOSCOPY  Result Date: 04/22/2020 C-ARM BRONCHOSCOPY: Fluoroscopy was utilized by the requesting physician.  No radiographic interpretation.        This case was discussed with Dr. Julien Nordmann. He expressed  agreement with my management of this patient.

## 2020-05-13 ENCOUNTER — Ambulatory Visit: Payer: Medicare HMO

## 2020-05-13 LAB — PROTIME-INR
INR: 3.3 — ABNORMAL HIGH (ref 0.8–1.2)
Prothrombin Time: 32.2 seconds — ABNORMAL HIGH (ref 11.4–15.2)

## 2020-05-13 LAB — URINE CULTURE: Culture: >=100000 — AB

## 2020-05-13 MED ORDER — CEPHALEXIN 500 MG PO CAPS: 500.0000 mg | ORAL_CAPSULE | Freq: Four times a day (QID) | ORAL | 0 refills | Status: AC

## 2020-05-13 MED ORDER — WARFARIN - PHARMACIST DOSING INPATIENT: Freq: Every day | Status: DC

## 2020-05-13 MED ORDER — SACCHAROMYCES BOULARDII 250 MG PO CAPS: 250.0000 mg | ORAL_CAPSULE | Freq: Two times a day (BID) | ORAL | 0 refills | Status: DC

## 2020-05-13 MED ORDER — WARFARIN SODIUM 1 MG PO TABS: 1.0000 mg | ORAL_TABLET | Freq: Every day | ORAL | 0 refills | Status: DC

## 2020-05-13 MED ORDER — WARFARIN SODIUM 2.5 MG PO TABS: 2.5000 mg | ORAL_TABLET | Freq: Once | ORAL | Status: DC

## 2020-05-13 MED ORDER — SACCHAROMYCES BOULARDII 250 MG PO CAPS: 250.0000 mg | ORAL_CAPSULE | Freq: Two times a day (BID) | ORAL | Status: DC

## 2020-05-13 NOTE — Evaluation (Signed)
Physical Therapy Evaluation Patient Details Name: Leah Velasquez MRN: [id number] DOB: 30-Sep-1940 Today's Date: 05/13/2020   History of Present Illness  79 year old female lives in [place] fall was admitted from cancer center where she went for her first chemo treatment for newly diagnosed small cell lung CA.  She has history of rheumatoid arthritis atrial fibrillation hypertension and hyperlipidemia.  She was admitted with change in mental status.  Her baseline apparently she has memory issues but she is awake and alert and able to speak.    Clinical Impression  Leah Velasquez is 79 y.o. female admitted with above HPI and diagnosis. Patient is currently limited by functional impairments below (see PT problem list). Patient lives with her husband at [place] in a townhome and reports independence with rollator for household mobility and assist needed for ADL's at baseline. Patient currently required 2+ person assist for functional transfers and to return to bed. Patient will benefit from continued skilled PT interventions to address impairments and progress independence with mobility. Patient unsafe to return home due to increased physical assist required to mobilize; recommending SNF with 24/7 assist at this time. Acute PT will follow and progress as able.     Follow Up Recommendations SNF;Supervision/Assistance - 24 hour    Equipment Recommendations  None recommended by PT    Recommendations for Other Services       Precautions / Restrictions Precautions Precautions: Fall Precaution Comments: knee pain, incontinent, reports history of falling. Restrictions Weight Bearing Restrictions: No      Mobility  Bed Mobility Overal bed mobility: Needs Assistance Bed Mobility: Sit to Supine;Sidelying to Sit;Rolling Rolling: Mod assist Sidelying to sit: Mod assist;HOB elevated   Sit to supine: Total assist;+2 for safety/equipment;+2 for physical assistance   General bed mobility comments:  cues for sequencing roll and using bed rail, assist to flex knee and initiate Lower body rolling. Mod assist to bring LE's off EOB and to raise trunk. Patient sitting close to EOB after sit<>stand and requried 3 person Total assist to reposition in bed. Patient offering no assistance with lifting LE's or moving trunk.   Transfers Overall transfer level: Needs assistance Equipment used: Rolling walker (2 wheeled) Transfers: Sit to/from Stand Sit to Stand: From elevated surface;+2 physical assistance;+2 safety/equipment;Max assist         General transfer comment: Attempted 1x with 1 person assist from elevated surface, pt unable to rise. 2+ Mod-Max assist for power up provided and pt stood for ~10 seconds then sat quickly to EOB as she was fearful of knees buckling.   Ambulation/Gait                Stairs            Wheelchair Mobility    Modified Rankin (Stroke Patients Only)       Balance Overall balance assessment: History of Falls;Needs assistance Sitting-balance support: Feet supported;Bilateral upper extremity supported Sitting balance-Leahy Scale: Fair     Standing balance support: Bilateral upper extremity supported Standing balance-Leahy Scale: Poor                Pertinent Vitals/Pain Pain Assessment: Faces Faces Pain Scale: Hurts a little bit Pain Location: bil knees in standing Pain Descriptors / Indicators: Grimacing;Guarding Pain Intervention(s): Limited activity within patient's tolerance;Monitored during session;Repositioned    Home Living Family/patient expects to be discharged to:: Skilled nursing facility Living Arrangements: Spouse/significant other;Other (Comment) Available Help at Discharge: Family Type of Home:  (Lives at [employer],  Townhome) Home Access: Level entry     Home Layout: One level Home Equipment: Toilet riser;Walker - 2 wheels;Walker - 4 wheels Additional Comments: states her husband is physically able to help  her with getting out of bed and she typically can get up and walk with RW.     Prior Function Level of Independence: Needs assistance   Gait / Transfers Assistance Needed: pt reports use of Rollator for gait and sometimes she sits in wheelchair and her husband pushes her if it is a far distance.  ADL's / Homemaking Assistance Needed: Pt reports spouse assists with getting out of bed due to Lt LE weakness, needs help for ADLs        Hand Dominance   Dominant Hand: Right    Extremity/Trunk Assessment   Upper Extremity Assessment Upper Extremity Assessment: Generalized weakness    Lower Extremity Assessment Lower Extremity Assessment: Generalized weakness    Cervical / Trunk Assessment Cervical / Trunk Assessment: Kyphotic  Communication   Communication: HOH  Cognition Arousal/Alertness: Awake/alert Behavior During Therapy: Flat affect Overall Cognitive Status: No family/caregiver present to determine baseline cognitive functioning Area of Impairment: Memory;Following commands;Awareness;Safety/judgement;Problem solving                     Memory: Decreased short-term memory Following Commands: Follows one step commands with increased time;Follows one step commands consistently;Follows multi-step commands inconsistently Safety/Judgement: Decreased awareness of deficits   Problem Solving: Decreased initiation;Difficulty sequencing;Requires verbal cues General Comments: pt requries repeated cues and is slow for procesing and following commands. unsure if pt is accurate historian with PLOF      General Comments      Exercises     Assessment/Plan    PT Assessment Patient needs continued PT services  PT Problem List Decreased strength;Decreased range of motion;Decreased activity tolerance;Decreased balance;Decreased mobility;Decreased coordination;Decreased cognition;Decreased knowledge of use of DME;Decreased safety awareness;Cardiopulmonary status limiting  activity;Obesity;Pain       PT Treatment Interventions DME instruction;Gait training;Functional mobility training;Therapeutic activities;Therapeutic exercise;Balance training;Neuromuscular re-education;Patient/family education    PT Goals (Current goals can be found in the Care Plan section)  Acute Rehab PT Goals Patient Stated Goal: get back to home with husband PT Goal Formulation: With patient Time For Goal Achievement: 05/27/20 Potential to Achieve Goals: Fair    Frequency Min 2X/week   Barriers to discharge Decreased caregiver support requires 2 person assist    Co-evaluation               AM-PAC PT "6 Clicks" Mobility  Outcome Measure Help needed turning from your back to your side while in a flat bed without using bedrails?: A Lot Help needed moving from lying on your back to sitting on the side of a flat bed without using bedrails?: A Lot Help needed moving to and from a bed to a chair (including a wheelchair)?: Total Help needed standing up from a chair using your arms (e.g., wheelchair or bedside chair)?: Total Help needed to walk in hospital room?: Total Help needed climbing 3-5 steps with a railing? : Total 6 Click Score: 8    End of Session Equipment Utilized During Treatment: Gait belt Activity Tolerance: Patient tolerated treatment well;Patient limited by fatigue Patient left: in bed;with call bell/phone within reach;with bed alarm set Nurse Communication: Mobility status PT Visit Diagnosis: Muscle weakness (generalized) (M62.81);History of falling (Z91.81);Difficulty in walking, not elsewhere classified (R26.2)    Time: 9604-5409 PT Time Calculation (min) (ACUTE ONLY): 41 min   Charges:  PT Evaluation $PT Eval Moderate Complexity: 1 Mod PT Treatments $Therapeutic Activity: 23-37 mins        Verner Mould, DPT Acute Rehabilitation Services  Office [phone] Pager [phone]  05/13/2020 11:33 AM

## 2020-05-13 NOTE — Discharge Summary (Signed)
Physician Discharge Summary  Leah Velasquez NOM:[id number] DOB: September 18, 1940 DOA: 05/11/2020  PCP: [name], MD  Admit date: 05/11/2020 Discharge date: 05/13/2020  Admitted From: Nursing home Disposition: Nursing home Recommendations for Outpatient Follow-up:  1. Follow up with PCP in 1-2 weeks 2. Please obtain BMP/CBC in one week 3. Please follow up oncology on Monday 4. Please not the medication changes that I have made during this hospital admission I have stopped her Lasix and Cozaar since her blood pressure was normal to low.  Her INR on discharge was 3.3 I have decreased her Coumadin to 1 mg daily.  In addition to that she is on Keflex for UTI this can increase her INR. 5. Check INR 05/14/2020.   Home Health: None Equipment/Devices none Discharge Condition: Stable CODE STATUS: Full code Diet recommendation: Cardiac Brief/Interim Summary:79 year old female lives in [place] fall was admitted from cancer center where she went for her first chemo treatment for newly diagnosed small cell lung CA.  She has history of rheumatoid arthritis atrial fibrillation hypertension and hyperlipidemia.  She was admitted with change in mental status.  Her baseline apparently she has memory issues but she is awake and alert and able to speak.    Discharge Diagnoses:  Principal Problem:   Altered mental status Active Problems:   Persistent atrial fibrillation (HCC)   Generalized weakness   Essential hypertension   Long term (current) use of anticoagulants   Small cell lung cancer, right upper lobe (HCC)   Hypotension due to hypovolemia   History of memory loss   UTI (urinary tract infection) with pyuria  #1acute change in MS be multifactorial she was very hypotensive on admission with a blood pressure of 73/54 and 84/61, question related to prechemo drugs--compazine?  In addition to that she has UTI gram-negative rods growing in urine culture.  She was treated with IV Rocephin during the hospital  stay.  Her blood pressure improved with holding all the antihypertensives and giving her IV fluids.  She will be discharged today I have stopped the Lasix and Cozaar for the time being restart these medications as needed.  Patient was more awake and alert today. She improved faster than expected. She will be discharged on Keflex for a total of 5 days.  Seen by physical therapy recommending SNF.   #2  Hyponatremia resolved with IV fluids sodium 135.  #3 hypotension improved on IV fluids, her blood pressure is 135/73.  Continue beta-blocker and continue to hold Cozaar and Lasix.  #4 hyperlipidemia continue Zocor.  #5 lung CA patient has follow-up appointment with oncologist on Monday family is aware.   Estimated body mass index is 43.32 kg/m as calculated from the following:   Height as of this encounter: 5\' 1"  (1.549 m).   Weight as of this encounter: 104 kg.  Discharge Instructions  Discharge Instructions    Diet - low sodium heart healthy   Complete by: As directed    Increase activity slowly   Complete by: As directed      Allergies as of 05/13/2020      Reactions   Morphine Anaphylaxis   Morphine And Related Anaphylaxis   Vitals drop down and pass out   Morphine And Related Other (See Comments)   "vitals go down", pass out   Sulfa Antibiotics Rash   Sulfa Antibiotics Rash      Medication List    STOP taking these medications   furosemide 20 MG tablet Commonly known as: LASIX  loratadine 10 MG tablet Commonly known as: Claritin   losartan 25 MG tablet Commonly known as: COZAAR   zolpidem 5 MG tablet Commonly known as: AMBIEN     TAKE these medications   bisacodyl 10 MG suppository Commonly known as: DULCOLAX Place 10 mg rectally See admin instructions. If not relieved by MOM give 10mg  Bisacodyl supp. Rectally for x1 dose in 24 hours prn constipation   BL MILK OF MAGNESIA PO Take 30 mLs by mouth See admin instructions. If no BM in 3 day, give 30 cc Milk  of magnesium p.o. x 1 dose in 24 hours as needed   cephALEXin 500 MG capsule Commonly known as: KEFLEX Take 1 capsule (500 mg total) by mouth 4 (four) times daily for 5 days.   cyanocobalamin 1000 MCG tablet Take 1 tablet (1,000 mcg total) by mouth daily.   diclofenac Sodium 1 % Gel Commonly known as: Voltaren Apply 4 g topically 4 (four) times daily. To bilateral knees and ankles   levocetirizine 5 MG tablet Commonly known as: XYZAL Take 5 mg by mouth daily.   metoprolol succinate 25 MG 24 hr tablet Commonly known as: TOPROL-XL Take 12.5 mg by mouth at bedtime.   oxybutynin 5 MG 24 hr tablet Commonly known as: Ditropan XL Take 1 tablet (5 mg total) by mouth at bedtime.   prochlorperazine 10 MG tablet Commonly known as: COMPAZINE One tab every 6 hours prn nausea-Sent to [facility] facility on pt's paperwork per [employer] PA. What changed:   how much to take  how to take this  when to take this  reasons to take this   RA SALINE ENEMA RE Place 1 Container rectally See admin instructions. If not relieved by bisacodyl supp. Give disposable saline enema rectally x1 dose 24 hours prn constipation   saccharomyces boulardii 250 MG capsule Commonly known as: FLORASTOR Take 1 capsule (250 mg total) by mouth 2 (two) times daily.   simvastatin 20 MG tablet Commonly known as: ZOCOR Take 20 mg by mouth at bedtime.   timolol 0.5 % ophthalmic solution Commonly known as: TIMOPTIC Place 1 drop into both eyes at bedtime.   warfarin 1 MG tablet Commonly known as: Coumadin Take as directed. If you are unsure how to take this medication, talk to your nurse or doctor. Original instructions: Take 1 tablet (1 mg total) by mouth daily. What changed:   medication strength  how much to take  when to take this  additional instructions       Follow-up Information    [place], Jannifer Rodney, MD Follow up.   Specialty: Family Medicine Contact information: [place] [address] [phone]        Lorretta Harp, MD .   Specialties: Cardiology, Radiology Contact information: [address] [place] [address] [phone]        Curt Bears, MD Follow up.   Specialty: Oncology Contact information: [address] [place] [address] [phone]              Allergies  Allergen Reactions  . Morphine Anaphylaxis  . Morphine And Related Anaphylaxis    Vitals drop down and pass out  . Morphine And Related Other (See Comments)    "vitals go down", pass out  . Sulfa Antibiotics Rash  . Sulfa Antibiotics Rash    Consultations:  None   Procedures/Studies: DG Chest 2 View  Result Date: 04/16/2020 CLINICAL DATA:  Fell last night.  Low back pain. EXAM: CHEST - 2 VIEW COMPARISON:  Multiple priors, including chest radiograph from 02/20/2020 and PET-CT from 03/10/2020 FINDINGS: Prominence of the right paratracheal stripe with outwardly convex contour, likely secondary to known paramediastinal and right upper lobe lesions, better characterized on prior PET-CT. New diffuse interstitial prominence. Low lung volumes without focal consolidation. No discernible pneumothorax. No definite pleural effusions. Mild-to-moderate enlargement of the cardiopericardial silhouette, increased from prior. Polyarticular degenerative change. Calcific atherosclerosis of the aorta. Multiple remote right rib fractures without evidence of acute osseous abnormality. IMPRESSION: 1. Cardiomegaly with new diffuse interstitial prominence, suggestive of mild interstitial pulmonary edema. 2. Prominence of the right paratracheal stripe, likely secondary to known paramediastinal and right upper lobe lesions, better characterized on prior PET-CT. Electronically Signed   By: Margaretha Sheffield MD   On: 04/16/2020 07:59   DG Lumbar Spine Complete  Result Date: 04/16/2020 CLINICAL DATA:  Left low back pain.  Fall. EXAM:  LUMBAR SPINE - COMPLETE 4+ VIEW COMPARISON:  MRI lumbar spine 02/14/2020 FINDINGS: No definite vertebral body height loss when accounting for obliquity. Minimal anterolisthesis (grade 1) of L4 on L5 and L5 on S1, similar to prior. No new subluxation. Mild dextrocurvature. Mild-to-moderate degenerative changes, more pronounced in the lower lumbar spine and better characterized on prior MRI of the lumbar spine. Calcific aortic atherosclerosis. IMPRESSION: No radiographic evidence of acute fracture or traumatic malalignment. Electronically Signed   By: Margaretha Sheffield MD   On: 04/16/2020 08:04   CT Head Wo Contrast  Result Date: 05/11/2020 CLINICAL DATA:  Somnolent, altered level of consciousness, right upper lobe non-small cell lung cancer EXAM: CT HEAD WITHOUT CONTRAST TECHNIQUE: Contiguous axial images were obtained from the base of the skull through the vertex without intravenous contrast. COMPARISON:  02/07/2020, 02/05/2020 FINDINGS: Brain: Chronic small vessel ischemic changes are seen throughout the periventricular and subcortical white matter, stable. 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 No signs of acute infarct or hemorrhage. Lateral ventricles and midline structures are otherwise unremarkable. No acute extra-axial fluid collections. No mass effect. Vascular: No hyperdense vessel or unexpected calcification. Skull: Normal. Negative for fracture or focal lesion. Sinuses/Orbits: No acute finding. Other: None. IMPRESSION: 1. Stable chronic small vessel ischemic changes. No acute intracranial process. Electronically Signed   By: Randa Ngo M.D.   On: 05/11/2020 18:44   DG C-ARM BRONCHOSCOPY  Result Date: 04/22/2020 C-ARM BRONCHOSCOPY: Fluoroscopy was utilized by the requesting physician.  No radiographic interpretation.    (Echo, Carotid, EGD, Colonoscopy, ERCP)    Subjective: Patient is resting in bed she is trying to make a phone call to her  husband she is awake she is alert Anxious to go to rehab  Discharge Exam: Vitals:   05/13/20 0108 05/13/20 0441  BP: 122/63 135/79  Pulse: 72 73  Resp: 17 17  Temp: (!) 97.4 F (36.3 C) 97.8 F (36.6 C)  SpO2: 90% 92%   Vitals:   05/12/20 1603 05/12/20 2209 05/13/20 0108 05/13/20 0441  BP: (!) 117/58 114/62 122/63 135/79  Pulse: 65 66 72 73  Resp: 20 17 17 17   Temp: 97.7 F (36.5 C) 97.8 F (36.6 C) (!) 97.4 F (36.3 C) 97.8 F (36.6 C)  TempSrc: Oral Oral Oral Oral  SpO2: 100% 92% 90% 92%  Weight:      Height:        General: Pt is alert, awake, not in acute distress Cardiovascular: RRR, S1/S2 +, no rubs,  no gallops Respiratory: CTA bilaterally, no wheezing, no rhonchi Abdominal: Soft, NT, ND, bowel sounds + Extremities: no edema, no cyanosis    The results of significant diagnostics from this hospitalization (including imaging, microbiology, ancillary and laboratory) are listed below for reference.     Microbiology: Recent Results (from the past 240 hour(s))  Urine culture     Status: Abnormal   Collection Time: 05/11/20  7:57 PM   Specimen: Urine, Random  Result Value Ref Range Status   Specimen Description   Final    URINE, RANDOM Performed at [facility] [address] [place], [place] [address]    Special Requests   Final    NONE Performed at [facility], [place] [address], [place], [place] [address]    Culture >=100,000 COLONIES/mL PROTEUS MIRABILIS (A)  Final   Report Status 05/13/2020 FINAL  Final   Organism ID, Bacteria PROTEUS MIRABILIS (A)  Final      Susceptibility   Proteus mirabilis - MIC*    AMPICILLIN <=2 SENSITIVE Sensitive     CEFAZOLIN <=4 SENSITIVE Sensitive     CEFTRIAXONE <=0.25 SENSITIVE Sensitive     CIPROFLOXACIN <=0.25 SENSITIVE Sensitive     GENTAMICIN <=1 SENSITIVE Sensitive     IMIPENEM 8 INTERMEDIATE Intermediate     NITROFURANTOIN RESISTANT Resistant     TRIMETH/SULFA <=20  SENSITIVE Sensitive     AMPICILLIN/SULBACTAM <=2 SENSITIVE Sensitive     PIP/TAZO <=4 SENSITIVE Sensitive     * >=100,000 COLONIES/mL PROTEUS MIRABILIS  SARS Coronavirus 2 by RT PCR (hospital order, performed in [facility] hospital lab) Nasopharyngeal Nasopharyngeal Swab     Status: None   Collection Time: 05/11/20  9:50 PM   Specimen: Nasopharyngeal Swab  Result Value Ref Range Status   SARS Coronavirus 2 NEGATIVE NEGATIVE Final    Comment: (NOTE) SARS-CoV-2 target nucleic acids are NOT DETECTED.  The SARS-CoV-2 RNA is generally detectable in upper and lower respiratory specimens during the acute phase of infection. The lowest concentration of SARS-CoV-2 viral copies this assay can detect is 250 copies / mL. A negative result does not preclude SARS-CoV-2 infection and should not be used as the sole basis for treatment or other patient management decisions.  A negative result may occur with improper specimen collection / handling, submission of specimen other than nasopharyngeal swab, presence of viral mutation(s) within the areas targeted by this assay, and inadequate number of viral copies (<250 copies / mL). A negative result must be combined with clinical observations, patient history, and epidemiological information.  Fact Sheet for Patients:   StrictlyIdeas.no  Fact Sheet for Healthcare Providers: BankingDealers.co.za  This test is not yet approved or  cleared by the [place] FDA and has been authorized for detection and/or diagnosis of SARS-CoV-2 by FDA under an Emergency Use Authorization (EUA).  This EUA will remain in effect (meaning this test can be used) for the duration of the COVID-19 declaration under Section 564(b)(1) of the Act, 21 U.S.C. section 360bbb-3(b)(1), unless the authorization is terminated or revoked sooner.  Performed at [facility], [place] [address], [place], [place] [address]       Labs: BNP (last 3 results) Recent Labs    02/15/20 0123 04/16/20 0715 04/22/20 1850  BNP 90.6 178.5* 027.2*   Basic Metabolic Panel: Recent Labs  Lab 05/11/20 1313 05/11/20 1747 05/12/20 1633  NA 131* 133* 135  K 3.4* 3.5 4.1  CL 96* 95* 102  CO2 27 26 24  GLUCOSE 114* 124* 149*  BUN 11 13 14   CREATININE 0.70 0.77 0.63  CALCIUM 9.7 9.1 8.9   Liver Function Tests: Recent Labs  Lab 05/11/20 1313 05/11/20 1747  AST 27 29  ALT 25 27  ALKPHOS 93 78  BILITOT 0.9 1.0  PROT 6.7 6.9  ALBUMIN 2.9* 3.3*   No results for input(s): LIPASE, AMYLASE in the last 168 hours. No results for input(s): AMMONIA in the last 168 hours. CBC: Recent Labs  Lab 05/11/20 1313 05/11/20 1747 05/12/20 1633  WBC 5.8 8.4 9.0  NEUTROABS 3.0 6.9  --   HGB 14.7 15.0 17.3*  HCT 43.6 44.9 53.7*  MCV 92.6 94.7 95.6  PLT 182 182 129*   Cardiac Enzymes: No results for input(s): CKTOTAL, CKMB, CKMBINDEX, TROPONINI in the last 168 hours. BNP: Invalid input(s): POCBNP CBG: No results for input(s): GLUCAP in the last 168 hours. D-Dimer No results for input(s): DDIMER in the last 72 hours. Hgb A1c No results for input(s): HGBA1C in the last 72 hours. Lipid Profile No results for input(s): CHOL, HDL, LDLCALC, TRIG, CHOLHDL, LDLDIRECT in the last 72 hours. Thyroid function studies No results for input(s): TSH, T4TOTAL, T3FREE, THYROIDAB in the last 72 hours.  Invalid input(s): FREET3 Anemia work up No results for input(s): VITAMINB12, FOLATE, FERRITIN, TIBC, IRON, RETICCTPCT in the last 72 hours. Urinalysis    Component Value Date/Time   COLORURINE YELLOW 05/11/2020 1957   APPEARANCEUR CLOUDY (A) 05/11/2020 1957   LABSPEC 1.010 05/11/2020 1957   PHURINE 7.0 05/11/2020 1957   GLUCOSEU NEGATIVE 05/11/2020 1957   HGBUR MODERATE (A) 05/11/2020 [place] NEGATIVE 05/11/2020 [place] NEGATIVE 05/11/2020 1957   PROTEINUR 30 (A) 05/11/2020 1957   NITRITE NEGATIVE  05/11/2020 1957   LEUKOCYTESUR LARGE (A) 05/11/2020 1957   Sepsis Labs Invalid input(s): PROCALCITONIN,  WBC,  LACTICIDVEN Microbiology Recent Results (from the past 240 hour(s))  Urine culture     Status: Abnormal   Collection Time: 05/11/20  7:57 PM   Specimen: Urine, Random  Result Value Ref Range Status   Specimen Description   Final    URINE, RANDOM Performed at [facility], [place] [address] [place], [place] [address]    Special Requests   Final    NONE Performed at [facility], [place] [address], [place], [place] [address]    Culture >=100,000 COLONIES/mL PROTEUS MIRABILIS (A)  Final   Report Status 05/13/2020 FINAL  Final   Organism ID, Bacteria PROTEUS MIRABILIS (A)  Final      Susceptibility   Proteus mirabilis - MIC*    AMPICILLIN <=2 SENSITIVE Sensitive     CEFAZOLIN <=4 SENSITIVE Sensitive     CEFTRIAXONE <=0.25 SENSITIVE Sensitive     CIPROFLOXACIN <=0.25 SENSITIVE Sensitive     GENTAMICIN <=1 SENSITIVE Sensitive     IMIPENEM 8 INTERMEDIATE Intermediate     NITROFURANTOIN RESISTANT Resistant     TRIMETH/SULFA <=20 SENSITIVE Sensitive     AMPICILLIN/SULBACTAM <=2 SENSITIVE Sensitive     PIP/TAZO <=4 SENSITIVE Sensitive     * >=100,000 COLONIES/mL PROTEUS MIRABILIS  SARS Coronavirus 2 by RT PCR (hospital order, performed in [place] hospital lab) Nasopharyngeal Nasopharyngeal Swab     Status: None   Collection Time: 05/11/20  9:50 PM   Specimen: Nasopharyngeal Swab  Result Value Ref Range Status   SARS Coronavirus 2 NEGATIVE NEGATIVE Final    Comment: (NOTE) SARS-CoV-2 target nucleic acids are NOT DETECTED.  The  SARS-CoV-2 RNA is generally detectable in upper and lower respiratory specimens during the acute phase of infection. The lowest concentration of SARS-CoV-2 viral copies this assay can detect is 250 copies / mL. A negative result does not preclude SARS-CoV-2 infection and should not be used as the sole basis for  treatment or other patient management decisions.  A negative result may occur with improper specimen collection / handling, submission of specimen other than nasopharyngeal swab, presence of viral mutation(s) within the areas targeted by this assay, and inadequate number of viral copies (<250 copies / mL). A negative result must be combined with clinical observations, patient history, and epidemiological information.  Fact Sheet for Patients:   StrictlyIdeas.no  Fact Sheet for Healthcare Providers: BankingDealers.co.za  This test is not yet approved or  cleared by the [place] FDA and has been authorized for detection and/or diagnosis of SARS-CoV-2 by FDA under an Emergency Use Authorization (EUA).  This EUA will remain in effect (meaning this test can be used) for the duration of the COVID-19 declaration under Section 564(b)(1) of the Act, 21 U.S.C. section 360bbb-3(b)(1), unless the authorization is terminated or revoked sooner.  Performed at [facility], [place] [address] [place], [place] [address]      Time coordinating discharge:  39 minutes  SIGNED:   Georgette Shell, MD  [facility] 05/13/2020, 12:00 PM

## 2020-05-13 NOTE — Progress Notes (Signed)
[facility] for warfarin Indication: hx atrial fibrillation  Allergies  Allergen Reactions  . Morphine Anaphylaxis  . Morphine And Related Anaphylaxis    Vitals drop down and pass out  . Morphine And Related Other (See Comments)    "vitals go down", pass out  . Sulfa Antibiotics Rash  . Sulfa Antibiotics Rash    Patient Measurements: Height: 5\' 1"  (154.9 cm) Weight: 104 kg (229 lb 4.5 oz) IBW/kg (Calculated) : 47.8  Vital Signs: Temp: 97.8 F (36.6 C) (09/22 0441) Temp Source: Oral (09/22 0441) BP: 135/79 (09/22 0441) Pulse Rate: 73 (09/22 0441)  Labs: Recent Labs    05/11/20 1313 05/11/20 1747 05/12/20 0800 05/12/20 1633 05/13/20 0803  HGB 14.7 15.0  --  17.3*  --   HCT 43.6 44.9  --  53.7*  --   PLT 182 182  --  129*  --   LABPROT  --  25.4* 48.2*  --  32.2*  INR  --  2.4* 5.5*  --  3.3*  CREATININE 0.70 0.77  --  0.63  --     Estimated Creatinine Clearance: 65.4 mL/min (by C-G formula based on SCr of 0.63 mg/dL).  Medications:  PTA warfarin regimen: 2.5 mg daily except 5 mg on Monday and Fri --> last dose taken on 9/19 at facility   Assessment: Patient's a 79 y.o F with NSCLC currently undergoing chemotherapy treatment and hx of afib on warfarin PTA, presented to the ED on 9/20 with AMS.  Pharmacy is consulted to resume warfarin on admission.  Today, 05/13/2020:  INR = 3.3 remains slightly elevated, but trending down as expected after warfarin held x2 days  CBC (9/21): Hgb WNL, Plt slightly low  No bleeding issues reported  Diet: Full liquid. Albumin 3.3  Drug interactions: None significant. Pt on CTX which has ability to make pt more sensitive to warfarin  Goal of Therapy:  INR 2-3 Monitor platelets by anticoagulation protocol: Yes   Plan:   Warfarin 2.5 mg PO once today  INR daily, CBC with AM labs tomorrow  Lenis Noon, PharmD 05/13/2020,9:23 AM

## 2020-05-13 NOTE — TOC Transition Note (Signed)
Transition of Care [facility]) - CM/SW Discharge Note   Patient Details  Name: Leah Velasquez MRN: [id number] Date of Birth: Feb 08, 1941  Transition of Care [facility]) CM/SW Contact:  Lynnell Catalan, RN Phone Number: 05/13/2020, 1:01 PM   Clinical Narrative:     Pt from [facility] and to dc back there today. Patient was receiving rehab at [facility]. PT saw patient again and is still recommending SNF. [facility] to accept her back and to start auth with [employer]. Pt to dc via PTAR. Daughter and patient updated at bedside. SNF is only requesting DC summary. FL2 is not needed. RN to call report to [phone] and will go to room 103.  Final next level of care: [place]      Social Determinants of Health (SDOH) Interventions     Readmission Risk Interventions No flowsheet data found.

## 2020-05-14 ENCOUNTER — Non-Acute Institutional Stay (SKILLED_NURSING_FACILITY): Payer: Medicare HMO | Admitting: Adult Health

## 2020-05-14 ENCOUNTER — Encounter: Payer: Self-pay | Admitting: Adult Health

## 2020-05-14 DIAGNOSIS — Z7901 Long term (current) use of anticoagulants: Secondary | ICD-10-CM

## 2020-05-14 DIAGNOSIS — I4821 Permanent atrial fibrillation: Secondary | ICD-10-CM

## 2020-05-14 NOTE — Progress Notes (Signed)
Location:  [place] Room Number: 103-P Place of Service:  SNF [phone]) Provider:  Durenda Age, DNP, FNP-BC  Patient Care Team: [name], MD as PCP - General (Family Medicine) Lorretta Harp, MD as PCP - Cardiology (Cardiology) Gavin Pound, MD as Consulting Physician (Rheumatology) Valrie Hart, RN as Oncology Nurse Navigator  Extended Emergency Contact Information Primary Emergency Contact: [name] Mobile Phone: [phone] Relation: Daughter Secondary Emergency Contact: [name] Mobile Phone: [phone] Relation: Daughter Preferred language: English Interpreter needed? No  Code Status:   Full Code  Goals of care: Advanced Directive information Advanced Directives 05/18/2020  Does Patient Have a Medical Advance Directive? No  Type of Advance Directive -  Does patient want to make changes to medical advance directive? No - Patient declined  Copy of [place] in Chart? -  Would patient like information on creating a medical advance directive? No - Patient declined     Chief Complaint  Patient presents with   Anticoagulation    INR management    HPI:  Pt is a 79 y.o. female seen today for Coumadin therapy. Latest INR  3.2. INR yesterday was 3.5 and Coumadin 1 mg was given. No bleeding nor bruising noted. She takes Coumadin for PAF. She currently takes Cephalexin for UTI. She has a PMH of small cell lung cancer, memory loss and hypertension.  Past Medical History:  Diagnosis Date   A-fib [facility])    Dysrhythmia    GERD (gastroesophageal reflux disease)    Hemorrhoids    Hyperlipidemia    Hypertension    Lung cancer ([place]) 2000   RLL   Obesity    Persistent atrial fibrillation (HCC)    Rheumatoid arthritis ([place])    Past Surgical History:  Procedure Laterality Date   BREAST EXCISIONAL BIOPSY Left    x 2   BREAST EXCISIONAL BIOPSY Right    BRONCHIAL BIOPSY   02/18/2020   Procedure: BRONCHIAL BIOPSIES;  Surgeon: Collene Gobble, MD;  Location: [place];  Service: Cardiopulmonary;;   BRONCHIAL BRUSHINGS  02/18/2020   Procedure: BRONCHIAL BRUSHINGS;  Surgeon: Collene Gobble, MD;  Location: [place];  Service: Cardiopulmonary;;   BRONCHIAL NEEDLE ASPIRATION BIOPSY  02/18/2020   Procedure: BRONCHIAL NEEDLE ASPIRATION BIOPSIES;  Surgeon: Collene Gobble, MD;  Location: [place];  Service: Cardiopulmonary;;   BRONCHIAL WASHINGS  02/18/2020   Procedure: BRONCHIAL WASHINGS;  Surgeon: Collene Gobble, MD;  Location: [place];  Service: Cardiopulmonary;;   CARDIOVERSION N/A 01/09/2018   Procedure: CARDIOVERSION;  Surgeon: Josue Hector, MD;  Location[facility] ENDOSCOPY;  Service: Cardiovascular;  Laterality: N/A;   CARDIOVERSION N/A 03/01/2018   Procedure: CARDIOVERSION;  Surgeon: Sueanne Margarita, MD;  Location: [facility] ENDOSCOPY;  Service: Cardiovascular;  Laterality: N/A;   ELECTROMAGNETIC NAVIGATION BROCHOSCOPY N/A 02/18/2020   Procedure: ELECTROMAGNETIC NAVIGATION BRONCHOSCOPY;  Surgeon: Collene Gobble, MD;  Location: [facility] ENDOSCOPY;  Service: Cardiopulmonary;  Laterality: N/A;   KNEE SURGERY     LUNG LOBECTOMY Right 2000   RLL removed   VIDEO BRONCHOSCOPY N/A 02/18/2020   Procedure: VIDEO BRONCHOSCOPY WITH FLUORO;  Surgeon: Collene Gobble, MD;  Location: [place];  Service: Cardiopulmonary;  Laterality: N/A;   VIDEO BRONCHOSCOPY WITH ENDOBRONCHIAL ULTRASOUND N/A 04/22/2020   Procedure: VIDEO BRONCHOSCOPY WITH ENDOBRONCHIAL ULTRASOUND;  Surgeon: [name], MD;  Location: [facility] OR;  Service: Thoracic;  Laterality: N/A;    Allergies  Allergen Reactions   Morphine Anaphylaxis  Morphine And Related Other (See Comments)    "vitals go down", pass out   Sulfa Antibiotics Rash    Outpatient Encounter Medications as of 05/14/2020  Medication Sig   bisacodyl (DULCOLAX) 10 MG suppository Place 10 mg rectally See admin  instructions. If not relieved by MOM give 10mg  Bisacodyl supp. Rectally for x1 dose in 24 hours prn constipation   [EXPIRED] cephALEXin (KEFLEX) 500 MG capsule Take 1 capsule (500 mg total) by mouth 4 (four) times daily for 5 days.   diclofenac Sodium (VOLTAREN) 1 % GEL Apply 4 g topically 4 (four) times daily. To bilateral knees and ankles   levocetirizine (XYZAL) 5 MG tablet Take 5 mg by mouth daily.   Magnesium Hydroxide (BL MILK OF MAGNESIA PO) Take 30 mLs by mouth See admin instructions. If no BM in 3 day, give 30 cc Milk of magnesium p.o. x 1 dose in 24 hours as needed    metoprolol succinate (TOPROL-XL) 25 MG 24 hr tablet Take 12.5 mg by mouth at bedtime.    prochlorperazine (COMPAZINE) 10 MG tablet One tab every 6 hours prn nausea-Sent to [place] facility on pt's paperwork per Cassie H PA.   saccharomyces boulardii (FLORASTOR) 250 MG capsule Take 1 capsule (250 mg total) by mouth 2 (two) times daily.   simvastatin (ZOCOR) 20 MG tablet Take 20 mg by mouth at bedtime.   Sodium Phosphates (RA SALINE ENEMA RE) Place 1 Container rectally See admin instructions. If not relieved by bisacodyl supp. Give disposable saline enema rectally x1 dose 24 hours prn constipation   timolol (TIMOPTIC) 0.5 % ophthalmic solution Place 1 drop into both eyes at bedtime.   vitamin B-12 1000 MCG tablet Take 1 tablet (1,000 mcg total) by mouth daily.   [DISCONTINUED] oxybutynin (DITROPAN XL) 5 MG 24 hr tablet Take 1 tablet (5 mg total) by mouth at bedtime.   [DISCONTINUED] warfarin (COUMADIN) 1 MG tablet Take 1 tablet (1 mg total) by mouth daily.   No facility-administered encounter medications on file as of 05/14/2020.    Review of Systems  GENERAL: No fever or chills  MOUTH and THROAT: Denies oral discomfort, gingival pain or bleeding RESPIRATORY: no cough, SOB, DOE, wheezing, hemoptysis CARDIAC: No chest pain or palpitations GI: No abdominal pain, diarrhea, constipation, heart burn,  nausea or vomiting GU: Denies dysuria, frequency, hematuria or discharge NEUROLOGICAL: Denies dizziness, syncope, numbness, or headache PSYCHIATRIC: Denies feelings of depression or anxiety. No report of hallucinations, insomnia, paranoia, or agitation   Immunization History  Administered Date(s) Administered   Influenza, High Dose Seasonal PF 06/27/2016, 05/23/2017, 06/08/2018   Moderna SARS-COVID-2 Vaccination 11/12/2019, 12/10/2019   Pneumococcal Conjugate-13 01/04/2017   Pneumococcal Polysaccharide-23 01/17/2018   Pertinent  Health Maintenance Due  Topic Date Due   INFLUENZA VACCINE  08/21/2020 (Originally 03/22/2020)   DEXA SCAN  Completed   PNA vac Low Risk Adult  Completed   No flowsheet data found.   Vitals:   05/14/20 1540  BP: 122/63  Pulse: 72  Resp: 17  Temp: (!) 97.4 F (36.3 C)  TempSrc: Oral  SpO2: 90%  Weight: 229 lb 4.5 oz (104 kg)  Height: 5\' 1"  (1.549 m)   Body mass index is 43.32 kg/m.  Physical Exam  GENERAL APPEARANCE: Well nourished. In no acute distress.  Morbidly obese SKIN:  Skin is warm and dry.  MOUTH and THROAT: Lips are without lesions. Oral mucosa is moist and without lesions.  RESPIRATORY: Breathing is even & unlabored, BS CTAB  CARDIAC: Irregular rhythm no murmur,no extra heart sounds GI: Abdomen soft, normal BS, no masses, no tenderness NEUROLOGICAL: There is no tremor. Speech is clear. PSYCHIATRIC:  Affect and behavior are appropriate  Labs reviewed: Recent Labs    02/15/20 0121 02/16/20 0431 04/18/20 0239 04/18/20 0239 04/19/20 0801 04/19/20 0801 04/20/20 0807 04/21/20 0727 05/11/20 1747 05/12/20 1633 05/18/20 1456  NA 138   < > 137   < > 137   < > 138   < > 133* 135 137  K 3.5   < > 4.8   < > 3.6   < > 3.4*   < > 3.5 4.1 3.5  CL 102   < > 106   < > 104   < > 103   < > 95* 102 102  CO2 23   < > 24   < > 22   < > 23   < > 26 24 31   GLUCOSE 99   < > 89   < > 88   < > 90   < > 124* 149* 96  BUN 12   < > 10   < > 11    < > 10   < > 13 14 10   CREATININE 0.82   < > 0.85   < > 0.77   < > 0.80   < > 0.77 0.63 0.56  CALCIUM 9.0   < > 8.6*   < > 8.6*   < > 8.7*   < > 9.1 8.9 9.4  MG 1.9   < > 2.2  --  1.8  --  1.8  --   --   --   --   PHOS 3.0  --   --   --   --   --   --   --   --   --   --    < > = values in this interval not displayed.   Recent Labs    05/11/20 1313 05/11/20 1747 05/18/20 1456  AST 27 29 28   ALT 25 27 33  ALKPHOS 93 78 86  BILITOT 0.9 1.0 0.6  PROT 6.7 6.9 6.2*  ALBUMIN 2.9* 3.3* 2.6*   Recent Labs    04/22/20 1850 05/11/20 1313 05/11/20 1747 05/12/20 1633 05/18/20 1456  WBC   < > 5.8 8.4 9.0 3.8*  NEUTROABS  --  3.0 6.9  --  2.0  HGB   < > 14.7 15.0 17.3* 13.0  HCT   < > 43.6 44.9 53.7* 39.7  MCV   < > 92.6 94.7 95.6 94.7  PLT   < > 182 182 129* 106*   < > = values in this interval not displayed.   Lab Results  Component Value Date   TSH 2.458 02/15/2020   Lab Results  Component Value Date   HGBA1C 5.7 (H) 02/15/2020   No results found for: CHOL, HDL, LDLCALC, LDLDIRECT, TRIG, CHOLHDL  Significant Diagnostic Results in last 30 days:  CT Head Wo Contrast  Result Date: 05/11/2020 CLINICAL DATA:  Somnolent, altered level of consciousness, right upper lobe non-small cell lung cancer EXAM: CT HEAD WITHOUT CONTRAST TECHNIQUE: Contiguous axial images were obtained from the base of the skull through the vertex without intravenous contrast. COMPARISON:  02/07/2020, 02/05/2020 FINDINGS: Brain: Chronic small vessel ischemic changes are seen throughout the periventricular and subcortical white matter, stable. 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 No signs of acute infarct or  hemorrhage. Lateral ventricles and midline structures are otherwise unremarkable. No acute extra-axial fluid collections. No mass effect. Vascular: No hyperdense vessel or unexpected calcification. Skull: Normal. Negative for fracture or focal lesion.  Sinuses/Orbits: No acute finding. Other: None. IMPRESSION: 1. Stable chronic small vessel ischemic changes. No acute intracranial process. Electronically Signed   By: Randa Ngo M.D.   On: 05/11/2020 18:44    Assessment/Plan  1. Current use of long term anticoagulation -  INR 3.1, down from yesterday INR 3.5, continue Coumadin 1 mg daily, re-check INR on 05/15/20  2. Permanent atrial fibrillation (HCC) - rate-controlled, continue metoprolol succinate ER for rate control and Coumadin for anticoagulation    Family/ staff Communication:   Discussed plan of care with resident and charge nurse.  Labs/tests ordered:  INR on 05/15/20  Goals of care:   Short-term care   Durenda Age, DNP, MSN, FNP-BC [facility] [phone] (Monday-Friday 8:00 a.m. - 5:00 p.m.) [phone] (after hours)

## 2020-05-15 ENCOUNTER — Non-Acute Institutional Stay (SKILLED_NURSING_FACILITY): Payer: Medicare HMO | Admitting: Internal Medicine

## 2020-05-15 ENCOUNTER — Encounter: Payer: Self-pay | Admitting: Internal Medicine

## 2020-05-15 ENCOUNTER — Inpatient Hospital Stay: Payer: Medicare HMO

## 2020-05-15 ENCOUNTER — Other Ambulatory Visit: Payer: Self-pay | Admitting: Internal Medicine

## 2020-05-15 DIAGNOSIS — Z789 Other specified health status: Secondary | ICD-10-CM

## 2020-05-15 DIAGNOSIS — M069 Rheumatoid arthritis, unspecified: Secondary | ICD-10-CM

## 2020-05-15 DIAGNOSIS — E860 Dehydration: Secondary | ICD-10-CM | POA: Diagnosis not present

## 2020-05-15 DIAGNOSIS — E871 Hypo-osmolality and hyponatremia: Secondary | ICD-10-CM

## 2020-05-15 DIAGNOSIS — Z7189 Other specified counseling: Secondary | ICD-10-CM

## 2020-05-15 DIAGNOSIS — M170 Bilateral primary osteoarthritis of knee: Secondary | ICD-10-CM

## 2020-05-15 DIAGNOSIS — R410 Disorientation, unspecified: Secondary | ICD-10-CM | POA: Diagnosis not present

## 2020-05-15 DIAGNOSIS — C3411 Malignant neoplasm of upper lobe, right bronchus or lung: Secondary | ICD-10-CM

## 2020-05-15 DIAGNOSIS — R531 Weakness: Secondary | ICD-10-CM

## 2020-05-15 DIAGNOSIS — N3281 Overactive bladder: Secondary | ICD-10-CM

## 2020-05-15 DIAGNOSIS — G6289 Other specified polyneuropathies: Secondary | ICD-10-CM

## 2020-05-15 DIAGNOSIS — I4821 Permanent atrial fibrillation: Secondary | ICD-10-CM

## 2020-05-15 MED ORDER — WARFARIN SODIUM 2.5 MG PO TABS: 2.5000 mg | ORAL_TABLET | Freq: Every day | ORAL | 0 refills | Status: DC

## 2020-05-15 NOTE — Progress Notes (Signed)
Provider:  Rexene Edison. Mariea Clonts, D.O., C.M.D. Location:  [facility] Room Number: [place]:  SNF (31)  PCP: [name], MD Patient Care Team: [name], MD as PCP - General (Family Medicine) Lorretta Harp, MD as PCP - Cardiology (Cardiology) Gavin Pound, MD as Consulting Physician (Rheumatology) Valrie Hart, RN as Oncology Nurse Navigator  Extended Emergency Contact Information Primary Emergency Contact: [name] Mobile Phone: [phone] Relation: Daughter Secondary Emergency Contact: [name] Mobile Phone: [phone] Relation: Daughter Preferred language: Cleophus Molt Interpreter needed? No  Code Status: FULL CODE Goals of Care: Advanced Directive information Advanced Directives 05/15/2020  Does Patient Have a Medical Advance Directive? No  Type of Advance Directive -  Does patient want to make changes to medical advance directive? No - Patient declined  Copy of [place] in Chart? -  Would patient like information on creating a medical advance directive? -   Chief Complaint  Patient presents with  . New Admit To SNF    New Admit     HPI: Patient is a 79 y.o. female seen today for readmission to [facility] status post hospitalization from September 20 through September 22 due to decreased level of consciousness.  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  During initiation of treatment and premedication with IV Decadron and an antiemetic (Compazine) she became less responsive and her blood pressure dropped.  She was given Narcan x2 with only a partial response.  She became alert enough that her chemotherapy was given.  Afterward her mental status worsened again and her blood pressure dropped again and the rapid response team was contacted.  She will end up being admitted and a work-up  performed.  She had a positive urinalysis so urine culture was sent off and she was treated with Rocephin.  Discussion with her daughter indicated she would had been having some memory issues recently but was normally alert and talkative as was clearly noted during my admission note for [facility].  During her stay she was also noted to have hyponatremia which certainly can cause changes in mental status.  She had felt to be a bit dehydrated as well and the sodium improved from 131-135 with hydration.  Of note, at discharge she was still disoriented to place thinking she was back at [facility] rather than at the hospital.  She had a negative Covid test on September 20.  She had a CT of her head which showed chronic small vessel ischemic changes and basal ganglia chronic lacunar wounds.  Her Claritin, Ambien, Lasix, and Cozaar were discontinued during her hospitalization due to her decreased level of consciousness and her dehydration status with hypotension.  Her INR was 3.3 on September 22 and a follow-up was recommended on September 23.  CBC and BMP were recommended in 1 week.  She remained a full code.  I had ordered palliative care when she first arrived to the last time for her rehab stay and she was quite agreeable to this to assist with her goals of care planning; however, it appears that was never done.  Order will be placed again today.  She admits to significant fatigue after her chemo.  she also c/o burning pain in her feet.  Apparently has had this before.  She says she has been eating and drinking better, but  had not touched her oatmeal and eggs, just had a piece of toast for breakfast.  When asked, she said she did remember meeting me before her hospital stay, but her response was not convincing.  Nursing is noticing ongoing increased confusion.    Past Medical History:  Diagnosis Date  . A-fib ([place])   . Dysrhythmia   . GERD (gastroesophageal reflux disease)   . Hemorrhoids   .  Hyperlipidemia   . Hypertension   . Lung cancer ([place]) 2000   RLL  . Obesity   . Persistent atrial fibrillation ([place])   . Rheumatoid arthritis [facility])    Past Surgical History:  Procedure Laterality Date  . BREAST EXCISIONAL BIOPSY Left    x 2  . BREAST EXCISIONAL BIOPSY Right   . BRONCHIAL BIOPSY  02/18/2020   Procedure: BRONCHIAL BIOPSIES;  Surgeon: Collene Gobble, MD;  Location: [facility] ENDOSCOPY;  Service: Cardiopulmonary;;  . BRONCHIAL BRUSHINGS  02/18/2020   Procedure: BRONCHIAL BRUSHINGS;  Surgeon: Collene Gobble, MD;  Location: [facility];  Service: Cardiopulmonary;;  . BRONCHIAL NEEDLE ASPIRATION BIOPSY  02/18/2020   Procedure: BRONCHIAL NEEDLE ASPIRATION BIOPSIES;  Surgeon: Collene Gobble, MD;  Location: [facility];  Service: Cardiopulmonary;;  . BRONCHIAL WASHINGS  02/18/2020   Procedure: BRONCHIAL WASHINGS;  Surgeon: Collene Gobble, MD;  Location: [facility];  Service: Cardiopulmonary;;  . CARDIOVERSION N/A 01/09/2018   Procedure: CARDIOVERSION;  Surgeon: Josue Hector, MD;  Location: [facility] ENDOSCOPY;  Service: Cardiovascular;  Laterality: N/A;  . CARDIOVERSION N/A 03/01/2018   Procedure: CARDIOVERSION;  Surgeon: Sueanne Margarita, MD;  Location: [facility] ENDOSCOPY;  Service: Cardiovascular;  Laterality: N/A;  . ELECTROMAGNETIC NAVIGATION BROCHOSCOPY N/A 02/18/2020   Procedure: ELECTROMAGNETIC NAVIGATION BRONCHOSCOPY;  Surgeon: Collene Gobble, MD;  Location: [facility];  Service: Cardiopulmonary;  Laterality: N/A;  . KNEE SURGERY    . LUNG LOBECTOMY Right 2000   RLL removed  . VIDEO BRONCHOSCOPY N/A 02/18/2020   Procedure: VIDEO BRONCHOSCOPY WITH FLUORO;  Surgeon: Collene Gobble, MD;  Location: [facility] ENDOSCOPY;  Service: Cardiopulmonary;  Laterality: N/A;  . VIDEO BRONCHOSCOPY WITH ENDOBRONCHIAL ULTRASOUND N/A 04/22/2020   Procedure: VIDEO BRONCHOSCOPY WITH ENDOBRONCHIAL ULTRASOUND;  Surgeon: [name], MD;  Location: [facility] OR;  Service: Thoracic;  Laterality: N/A;    Social  History   Socioeconomic History  . Marital status: Married    Spouse name: Not on file  . Number of children: Not on file  . Years of education: Not on file  . Highest education level: Not on file  Occupational History  . Not on file  Tobacco Use  . Smoking status: Former Research scientist (life sciences)  . Smokeless tobacco: Never Used  Vaping Use  . Vaping Use: Never used  Substance and Sexual Activity  . Alcohol use: No  . Drug use: No  . Sexual activity: Not on file  Other Topics Concern  . Not on file  Social History Narrative   ** Merged History Encounter **       ** Merged History Encounter **       Social Determinants of Health   Financial Resource Strain:   . Difficulty of Paying Living Expenses: Not on file  Food Insecurity:   . Worried About Charity fundraiser in the Last Year: Not on file  . Ran Out of Food in the Last Year: Not on file  Transportation Needs:   . Lack of Transportation (Medical): Not on file  . Lack of Transportation (Non-Medical): Not  on file  Physical Activity:   . Days of Exercise per Week: Not on file  . Minutes of Exercise per Session: Not on file  Stress:   . Feeling of Stress : Not on file  Social Connections:   . Frequency of Communication with Friends and Family: Not on file  . Frequency of Social Gatherings with Friends and Family: Not on file  . Attends Religious Services: Not on file  . Active Member of Clubs or Organizations: Not on file  . Attends Archivist Meetings: Not on file  . Marital Status: Not on file    reports that she has quit smoking. She has never used smokeless tobacco. She reports that she does not drink alcohol and does not use drugs.  Functional Status Survey:    History reviewed. No pertinent family history.  Health Maintenance  Topic Date Due  . Hepatitis C Screening  Never done  . TETANUS/TDAP  Never done  . INFLUENZA VACCINE  03/22/2020  . DEXA SCAN  Completed  . COVID-19 Vaccine  Completed  . PNA vac  Low Risk Adult  Completed    Allergies  Allergen Reactions  . Morphine Anaphylaxis  . Morphine And Related Other (See Comments)    "vitals go down", pass out  . Sulfa Antibiotics Rash    Outpatient Encounter Medications as of 05/15/2020  Medication Sig  . bisacodyl (DULCOLAX) 10 MG suppository Place 10 mg rectally See admin instructions. If not relieved by MOM give 10mg  Bisacodyl supp. Rectally for x1 dose in 24 hours prn constipation  . cephALEXin (KEFLEX) 500 MG capsule Take 1 capsule (500 mg total) by mouth 4 (four) times daily for 5 days.  . diclofenac Sodium (VOLTAREN) 1 % GEL Apply 4 g topically 4 (four) times daily. To bilateral knees and ankles  . levocetirizine (XYZAL) 5 MG tablet Take 5 mg by mouth daily.  . Magnesium Hydroxide (BL MILK OF MAGNESIA PO) Take 30 mLs by mouth See admin instructions. If no BM in 3 day, give 30 cc Milk of magnesium p.o. x 1 dose in 24 hours as needed   . metoprolol succinate (TOPROL-XL) 25 MG 24 hr tablet Take 12.5 mg by mouth at bedtime.   Marland Kitchen oxybutynin (DITROPAN XL) 5 MG 24 hr tablet Take 1 tablet (5 mg total) by mouth at bedtime.  . prochlorperazine (COMPAZINE) 10 MG tablet One tab every 6 hours prn nausea-Sent to [facility] facility on pt's paperwork per [employer] PA.  . saccharomyces boulardii (FLORASTOR) 250 MG capsule Take 1 capsule (250 mg total) by mouth 2 (two) times daily.  . simvastatin (ZOCOR) 20 MG tablet Take 20 mg by mouth at bedtime.  . Sodium Phosphates (RA SALINE ENEMA RE) Place 1 Container rectally See admin instructions. If not relieved by bisacodyl supp. Give disposable saline enema rectally x1 dose 24 hours prn constipation  . timolol (TIMOPTIC) 0.5 % ophthalmic solution Place 1 drop into both eyes at bedtime.  . vitamin B-12 1000 MCG tablet Take 1 tablet (1,000 mcg total) by mouth daily.  . [DISCONTINUED] warfarin (COUMADIN) 1 MG tablet Take 1 tablet (1 mg total) by mouth daily.   No facility-administered encounter  medications on file as of 05/15/2020.    Review of Systems  Constitutional: Positive for malaise/fatigue. Negative for chills and fever.       Weight used from past as no new in chart  HENT: Negative for congestion.   Eyes: Negative for blurred vision.  Respiratory:  Negative for cough and shortness of breath.   Cardiovascular: Negative for chest pain, palpitations and leg swelling.  Gastrointestinal: Negative for abdominal pain, blood in stool, constipation, diarrhea and melena.  Genitourinary: Positive for frequency and urgency. Negative for dysuria.       Overactive bladder diagnosis and atrophic vaginitis  Musculoskeletal: Positive for joint pain.  Skin: Negative for itching and rash.  Neurological: Positive for tingling, sensory change and weakness. Negative for dizziness, loss of consciousness and headaches.  Psychiatric/Behavioral: Positive for depression and memory loss. Negative for hallucinations. The patient is not nervous/anxious and does not have insomnia.        Some recent mild memory loss prior to latest admissions    Vitals:   05/15/20 0909  BP: (!) 159/81  Pulse: 74  Temp: (!) 97.4 F (36.3 C)  Weight: 229 lb 4.5 oz (104 kg)  Height: 5\' 1"  (1.549 m)   Body mass index is 43.32 kg/m. Physical Exam Constitutional:      General: She is not in acute distress.    Appearance: She is obese. She is not ill-appearing or toxic-appearing.  HENT:     Head: Normocephalic and atraumatic.     Right Ear: External ear normal.     Left Ear: External ear normal.     Mouth/Throat:     Pharynx: Oropharynx is clear.  Eyes:     Extraocular Movements: Extraocular movements intact.     Pupils: Pupils are equal, round, and reactive to light.  Cardiovascular:     Rate and Rhythm: Rhythm irregular.     Heart sounds: No murmur heard.   Pulmonary:     Effort: Pulmonary effort is normal.     Breath sounds: No stridor. No wheezing, rhonchi or rales.  Abdominal:     General: Bowel  sounds are normal. There is no distension.     Palpations: Abdomen is soft.     Tenderness: There is no abdominal tenderness. There is no guarding or rebound.  Musculoskeletal:        General: Normal range of motion.     Cervical back: Neck supple.     Right lower leg: No edema.     Left lower leg: No edema.  Lymphadenopathy:     Cervical: No cervical adenopathy.  Skin:    General: Skin is warm and dry.     Coloration: Skin is pale.  Neurological:     General: No focal deficit present.     Mental Status: She is alert.     Cranial Nerves: No cranial nerve deficit.     Sensory: Sensory deficit present.     Motor: Weakness present.     Coordination: Coordination normal.     Deep Tendon Reflexes: Reflexes normal.     Comments: See hpi, seemed uncertain of things when I was asking her questions, oriented to person and place, not time today  Psychiatric:     Comments: Down today     Labs reviewed: Basic Metabolic Panel: Recent Labs    02/15/20 0121 02/16/20 0431 04/18/20 0239 04/18/20 0239 04/19/20 0801 04/19/20 0801 04/20/20 0807 04/21/20 0727 05/11/20 1313 05/11/20 1747 05/12/20 1633  NA 138   < > 137   < > 137   < > 138   < > 131* 133* 135  K 3.5   < > 4.8   < > 3.6   < > 3.4*   < > 3.4* 3.5 4.1  CL 102   < > 106   < >  104   < > 103   < > 96* 95* 102  CO2 23   < > 24   < > 22   < > 23   < > 27 26 24   GLUCOSE 99   < > 89   < > 88   < > 90   < > 114* 124* 149*  BUN 12   < > 10   < > 11   < > 10   < > 11 13 14   CREATININE 0.82   < > 0.85   < > 0.77   < > 0.80   < > 0.70 0.77 0.63  CALCIUM 9.0   < > 8.6*   < > 8.6*   < > 8.7*   < > 9.7 9.1 8.9  MG 1.9   < > 2.2  --  1.8  --  1.8  --   --   --   --   PHOS 3.0  --   --   --   --   --   --   --   --   --   --    < > = values in this interval not displayed.   Liver Function Tests: Recent Labs    05/04/20 1328 05/11/20 1313 05/11/20 1747  AST 24 27 29   ALT 23 25 27   ALKPHOS 93 93 78  BILITOT 0.8 0.9 1.0  PROT 6.6  6.7 6.9  ALBUMIN 3.0* 2.9* 3.3*   No results for input(s): LIPASE, AMYLASE in the last 8760 hours. No results for input(s): AMMONIA in the last 8760 hours. CBC: Recent Labs    05/04/20 1328 05/04/20 1328 05/11/20 1313 05/11/20 1747 05/12/20 1633  WBC 6.8   < > 5.8 8.4 9.0  NEUTROABS 4.3  --  3.0 6.9  --   HGB 14.4   < > 14.7 15.0 17.3*  HCT 42.9   < > 43.6 44.9 53.7*  MCV 92.9   < > 92.6 94.7 95.6  PLT 176   < > 182 182 129*   < > = values in this interval not displayed.   Cardiac Enzymes: Recent Labs    04/16/20 0715 04/18/20 0239 04/20/20 0807  CKTOTAL 1,732* 1,835* 255*   BNP: Invalid input(s): POCBNP Lab Results  Component Value Date   HGBA1C 5.7 (H) 02/15/2020   Lab Results  Component Value Date   TSH 2.458 02/15/2020   Lab Results  Component Value Date   LOVFIEPP29 518 04/16/2020   No results found for: FOLATE No results found for: IRON, TIBC, FERRITIN  Imaging and Procedures obtained prior to SNF admission: CT Head Wo Contrast  Result Date: 05/11/2020 CLINICAL DATA:  Somnolent, altered level of consciousness, right upper lobe non-small cell lung cancer EXAM: CT HEAD WITHOUT CONTRAST TECHNIQUE: Contiguous axial images were obtained from the base of the skull through the vertex without intravenous contrast. COMPARISON:  02/07/2020, 02/05/2020 FINDINGS: Brain: Chronic small vessel ischemic changes are seen throughout the periventricular and subcortical white matter, stable. 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 No signs of acute infarct or hemorrhage. Lateral ventricles and midline structures are otherwise unremarkable. No acute extra-axial fluid collections. No mass effect. Vascular: No hyperdense vessel or unexpected calcification. Skull: Normal. Negative for fracture or focal lesion. Sinuses/Orbits: No acute finding. Other: None. IMPRESSION: 1. Stable chronic small vessel ischemic changes. No acute intracranial  process. Electronically Signed   By: Diana Eves.D.  On: 05/11/2020 18:44    Assessment/Plan 1. Small cell lung cancer, right upper lobe ([place]) -now getting chemo -functional status here for rehab was already poor after last hospitalization -monitor carefully -involve outpatient palliative care to discuss goals of care for her and meet with her daughter who may need to be involved earlier than expected with decision-making given Ivannah's recent confusion  2. Delirium -multifactorial--multiple sedating meds that she'd requested to take, nausea meds for chemo, poor intake/dehydration/diuretics, some baseline MCI -monitor hydration and po intake carefully  3. Dehydration -off lasix now, monitor edema/sob  4. Hyponatremia -mild at hospital admission, likely a mix of poor fluid intake and her lasix, as well as lung cancer  5. Full code status - Full code per her wishes   6. Generalized weakness -ongoing, cont therapy, getting chemo now which is making her more fatigued than she already was when she came into SNF  7. Rheumatoid arthritis, involving unspecified site, unspecified whether rheumatoid factor present ([place]) -off treatment now due to chemo so likely will have increasing pain in joints  8. Primary osteoarthritis of both knees -cont current mgt and monitor  9. Overactive bladder D/c oxybutynin due to confusion Begin myrbetriq 25mg  daily for OAB instead  10. Permanent atrial fibrillation ([facility]) D/c coumadin 1mg  po daily Begin coumadin 2.5mg  po daily Recheck INR Tuesday, 9/28   11. ACP (advance care planning) -discussed again the idea of palliative care to help her determine her goals in life at this point; did not happen last time before she had her first chemo (unclear why) -she says she wants to receive treatment and already is, but depending on how this goes, we need to know when she would not want to keep that up to prepare appropriately especially after the first  set of events and her baseline weakness -each time I've seen her, she's been in bed -17 mins spent on ACP with patient  Monitor for edema due to discontinuation of lasix at hospital  12.  Peripheral neuropathy Begin gabapentin 100mg  qhs for neuropathic pain in legs and feet   Family/ staff Communication: discussed with snf nurse  Labs/tests ordered:  Cbc, bmp at one week  Tobe Kervin L. Breyson Kelm, D.O. [facility] [address], [place] [address] Cell Phone (Mon-Fri 8am-5pm):  [phone] On Call:  [phone] & follow prompts after 5pm & weekends Office Phone:  [phone] Office Fax:  [phone]

## 2020-05-18 ENCOUNTER — Encounter: Payer: Self-pay | Admitting: Internal Medicine

## 2020-05-18 ENCOUNTER — Other Ambulatory Visit: Payer: Self-pay

## 2020-05-18 ENCOUNTER — Inpatient Hospital Stay (HOSPITAL_BASED_OUTPATIENT_CLINIC_OR_DEPARTMENT_OTHER): Payer: Medicare HMO | Admitting: Internal Medicine

## 2020-05-18 ENCOUNTER — Inpatient Hospital Stay: Payer: Medicare HMO

## 2020-05-18 ENCOUNTER — Telehealth: Payer: Self-pay | Admitting: Internal Medicine

## 2020-05-18 VITALS — BP 147/75 | HR 81 | Temp 97.5°F | Resp 16 | Ht 61.0 in

## 2020-05-18 DIAGNOSIS — Z5111 Encounter for antineoplastic chemotherapy: Secondary | ICD-10-CM

## 2020-05-18 DIAGNOSIS — I10 Essential (primary) hypertension: Secondary | ICD-10-CM

## 2020-05-18 DIAGNOSIS — C3411 Malignant neoplasm of upper lobe, right bronchus or lung: Secondary | ICD-10-CM

## 2020-05-18 DIAGNOSIS — Z87891 Personal history of nicotine dependence: Secondary | ICD-10-CM | POA: Diagnosis not present

## 2020-05-18 LAB — CBC WITH DIFFERENTIAL (CANCER CENTER ONLY)
Abs Immature Granulocytes: 0.04 10*3/uL (ref 0.00–0.07)
Basophils Absolute: 0 10*3/uL (ref 0.0–0.1)
Basophils Relative: 1 %
Eosinophils Absolute: 0.3 10*3/uL (ref 0.0–0.5)
Eosinophils Relative: 8 %
HCT: 39.7 % (ref 36.0–46.0)
Hemoglobin: 13 g/dL (ref 12.0–15.0)
Immature Granulocytes: 1 %
Lymphocytes Relative: 30 %
Lymphs Abs: 1.1 10*3/uL (ref 0.7–4.0)
MCH: 31 pg (ref 26.0–34.0)
MCHC: 32.7 g/dL (ref 30.0–36.0)
MCV: 94.7 fL (ref 80.0–100.0)
Monocytes Absolute: 0.3 10*3/uL (ref 0.1–1.0)
Monocytes Relative: 8 %
Neutro Abs: 2 10*3/uL (ref 1.7–7.7)
Neutrophils Relative %: 52 %
Platelet Count: 106 10*3/uL — ABNORMAL LOW (ref 150–400)
RBC: 4.19 MIL/uL (ref 3.87–5.11)
RDW: 14.5 % (ref 11.5–15.5)
WBC Count: 3.8 10*3/uL — ABNORMAL LOW (ref 4.0–10.5)
nRBC: 0 % (ref 0.0–0.2)

## 2020-05-18 LAB — CMP (CANCER CENTER ONLY)
ALT: 33 U/L (ref 0–44)
AST: 28 U/L (ref 15–41)
Albumin: 2.6 g/dL — ABNORMAL LOW (ref 3.5–5.0)
Alkaline Phosphatase: 86 U/L (ref 38–126)
Anion gap: 4 — ABNORMAL LOW (ref 5–15)
BUN: 10 mg/dL (ref 8–23)
CO2: 31 mmol/L (ref 22–32)
Calcium: 9.4 mg/dL (ref 8.9–10.3)
Chloride: 102 mmol/L (ref 98–111)
Creatinine: 0.56 mg/dL (ref 0.44–1.00)
GFR, Est AFR Am: >60 mL/min (ref >60)
GFR, Estimated: >60 mL/min (ref >60)
Glucose, Bld: 96 mg/dL (ref 70–99)
Potassium: 3.5 mmol/L (ref 3.5–5.1)
Sodium: 137 mmol/L (ref 135–145)
Total Bilirubin: 0.6 mg/dL (ref 0.3–1.2)
Total Protein: 6.2 g/dL — ABNORMAL LOW (ref 6.5–8.1)

## 2020-05-18 NOTE — Telephone Encounter (Signed)
Scheduled per los. Gave avs and calendar  

## 2020-05-18 NOTE — Progress Notes (Signed)
[facility] [phone]   [phone]  OFFICE PROGRESS NOTE  [name], MD [address] [place] [address]  DIAGNOSIS: Limited stage, stage IIIb (T2 a, N2, M0) presented with right upper lobe lung mass in addition to mediastinal lymphadenopathy diagnosed in June 2021.  PRIOR THERAPY: None  CURRENT THERAPY: Systemic chemotherapy with carboplatin for AUC of 5 from day 1 and etoposide 100 mg/M2 on days 1, 2 and 3 with Neulasta support every 3 weeks.  First dose 05/11/2020.  Status post 1 cycle.  She received only day 1 of cycle #1.  INTERVAL HISTORY: JAILA SCHELLHORN 79 y.o. female returns to the clinic today for follow-up visit accompanied by her daughter.  The patient is feeling fine today except for the baseline fatigue.  She had an episode of unresponsiveness before the the first dose of her treatment last week.  She received Compazine at the skilled nursing facility before coming to the cancer center.  She felt better later on and she received her dose of carboplatin.  This was followed by unresponsiveness again and the patient was admitted to the hospital for further evaluation.  There was no clear etiology for her condition.  She had imaging studies of the brain that were negative.  The patient was discharged to [facility].  She is feeling fine except for the fatigue.  She denied having any chest pain, shortness of breath except with exertion with no cough or hemoptysis.  She denied having any weight loss or night sweats.  She has no nausea, vomiting, diarrhea or constipation.  She is here today for evaluation and repeat blood work.   MEDICAL HISTORY: Past Medical History:  Diagnosis Date  . A-fib ([facility])   . Dysrhythmia   . GERD (gastroesophageal reflux disease)   . Hemorrhoids   . Hyperlipidemia   . Hypertension   . Lung cancer ([facility]) 2000   RLL  . Obesity   . Persistent atrial fibrillation ([place])    . Rheumatoid arthritis ([facility])     ALLERGIES:  is allergic to morphine, morphine and related, and sulfa antibiotics.  MEDICATIONS:  Current Outpatient Medications  Medication Sig Dispense Refill  . bisacodyl (DULCOLAX) 10 MG suppository Place 10 mg rectally See admin instructions. If not relieved by MOM give 10mg  Bisacodyl supp. Rectally for x1 dose in 24 hours prn constipation    . cephALEXin (KEFLEX) 500 MG capsule Take 1 capsule (500 mg total) by mouth 4 (four) times daily for 5 days. 40 capsule 0  . diclofenac Sodium (VOLTAREN) 1 % GEL Apply 4 g topically 4 (four) times daily. To bilateral knees and ankles 350 g 1  . levocetirizine (XYZAL) 5 MG tablet Take 5 mg by mouth daily.    . Magnesium Hydroxide (BL MILK OF MAGNESIA PO) Take 30 mLs by mouth See admin instructions. If no BM in 3 day, give 30 cc Milk of magnesium p.o. x 1 dose in 24 hours as needed     . metoprolol succinate (TOPROL-XL) 25 MG 24 hr tablet Take 12.5 mg by mouth at bedtime.     . prochlorperazine (COMPAZINE) 10 MG tablet One tab every 6 hours prn nausea-Sent to [facility] facility on pt's paperwork per Cassie H PA. 30 tablet prn  . saccharomyces boulardii (FLORASTOR) 250 MG capsule Take 1 capsule (250 mg total) by mouth 2 (two) times daily. 60 capsule 0  . simvastatin (ZOCOR)  20 MG tablet Take 20 mg by mouth at bedtime.    . Sodium Phosphates (RA SALINE ENEMA RE) Place 1 Container rectally See admin instructions. If not relieved by bisacodyl supp. Give disposable saline enema rectally x1 dose 24 hours prn constipation    . timolol (TIMOPTIC) 0.5 % ophthalmic solution Place 1 drop into both eyes at bedtime.    . vitamin B-12 1000 MCG tablet Take 1 tablet (1,000 mcg total) by mouth daily. 30 tablet 0  . warfarin (COUMADIN) 2.5 MG tablet Take 1 tablet (2.5 mg total) by mouth daily. 30 tablet 0   No current facility-administered medications for this visit.    SURGICAL HISTORY:  Past Surgical History:  Procedure  Laterality Date  . BREAST EXCISIONAL BIOPSY Left    x 2  . BREAST EXCISIONAL BIOPSY Right   . BRONCHIAL BIOPSY  02/18/2020   Procedure: BRONCHIAL BIOPSIES;  Surgeon: Collene Gobble, MD;  Location: [facility] ENDOSCOPY;  Service: Cardiopulmonary;;  . BRONCHIAL BRUSHINGS  02/18/2020   Procedure: BRONCHIAL BRUSHINGS;  Surgeon: Collene Gobble, MD;  Location: [facility];  Service: Cardiopulmonary;;  . BRONCHIAL NEEDLE ASPIRATION BIOPSY  02/18/2020   Procedure: BRONCHIAL NEEDLE ASPIRATION BIOPSIES;  Surgeon: Collene Gobble, MD;  Location: [facility];  Service: Cardiopulmonary;;  . BRONCHIAL WASHINGS  02/18/2020   Procedure: BRONCHIAL WASHINGS;  Surgeon: Collene Gobble, MD;  Location: [facility];  Service: Cardiopulmonary;;  . CARDIOVERSION N/A 01/09/2018   Procedure: CARDIOVERSION;  Surgeon: Josue Hector, MD;  [facility];  Service: Cardiovascular;  Laterality: N/A;  . CARDIOVERSION N/A 03/01/2018   Procedure: CARDIOVERSION;  Surgeon: Sueanne Margarita, MD;  Location: [facility] ENDOSCOPY;  Service: Cardiovascular;  Laterality: N/A;  . ELECTROMAGNETIC NAVIGATION BROCHOSCOPY N/A 02/18/2020   Procedure: ELECTROMAGNETIC NAVIGATION BRONCHOSCOPY;  Surgeon: Collene Gobble, MD;  Location: [facility];  Service: Cardiopulmonary;  Laterality: N/A;  . KNEE SURGERY    . LUNG LOBECTOMY Right 2000   RLL removed  . VIDEO BRONCHOSCOPY N/A 02/18/2020   Procedure: VIDEO BRONCHOSCOPY WITH FLUORO;  Surgeon: Collene Gobble, MD;  Location: [facility] ENDOSCOPY;  Service: Cardiopulmonary;  Laterality: N/A;  . VIDEO BRONCHOSCOPY WITH ENDOBRONCHIAL ULTRASOUND N/A 04/22/2020   Procedure: VIDEO BRONCHOSCOPY WITH ENDOBRONCHIAL ULTRASOUND;  Surgeon: [name], MD;  Location: [facility] OR;  Service: Thoracic;  Laterality: N/A;    REVIEW OF SYSTEMS:  A comprehensive review of systems was negative except for: Constitutional: positive for fatigue Respiratory: positive for dyspnea on exertion Musculoskeletal: positive for muscle  weakness   PHYSICAL EXAMINATION: General appearance: alert, cooperative, fatigued and no distress Head: Normocephalic, without obvious abnormality, atraumatic Neck: no adenopathy, no JVD, supple, symmetrical, trachea midline and thyroid not enlarged, symmetric, no tenderness/mass/nodules Lymph nodes: Cervical, supraclavicular, and axillary nodes normal. Resp: clear to auscultation bilaterally Back: symmetric, no curvature. ROM normal. No CVA tenderness. Cardio: regular rate and rhythm, S1, S2 normal, no murmur, click, rub or gallop GI: soft, non-tender; bowel sounds normal; no masses,  no organomegaly Extremities: extremities normal, atraumatic, no cyanosis or edema  ECOG PERFORMANCE STATUS: 1 - Symptomatic but completely ambulatory  Blood pressure (!) 147/75, pulse 81, temperature (!) 97.5 F (36.4 C), temperature source Tympanic, resp. rate 16, height 5\' 1"  (1.549 m), SpO2 95 %.  LABORATORY DATA: Lab Results  Component Value Date   WBC 3.8 (L) 05/18/2020   HGB 13.0 05/18/2020   HCT 39.7 05/18/2020   MCV 94.7 05/18/2020   PLT 106 (L) 05/18/2020      Chemistry  Component Value Date/Time   NA 135 05/12/2020 1633   NA 136 (A) 04/30/2020 0000   K 4.1 05/12/2020 1633   CL 102 05/12/2020 1633   CO2 24 05/12/2020 1633   BUN 14 05/12/2020 1633   BUN 19 04/30/2020 0000   CREATININE 0.63 05/12/2020 1633   CREATININE 0.70 05/11/2020 1313   GLU 93 04/30/2020 0000      Component Value Date/Time   CALCIUM 8.9 05/12/2020 1633   ALKPHOS 78 05/11/2020 1747   AST 29 05/11/2020 1747   AST 27 05/11/2020 1313   ALT 27 05/11/2020 1747   ALT 25 05/11/2020 1313   BILITOT 1.0 05/11/2020 1747   BILITOT 0.9 05/11/2020 1313       RADIOGRAPHIC STUDIES: CT Head Wo Contrast  Result Date: 05/11/2020 CLINICAL DATA:  Somnolent, altered level of consciousness, right upper lobe non-small cell lung cancer EXAM: CT HEAD WITHOUT CONTRAST TECHNIQUE: Contiguous axial images were obtained from  the base of the skull through the vertex without intravenous contrast. COMPARISON:  02/07/2020, 02/05/2020 FINDINGS: Brain: Chronic small vessel ischemic changes are seen throughout the periventricular and subcortical white matter, stable. 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 No signs of acute infarct or hemorrhage. Lateral ventricles and midline structures are otherwise unremarkable. No acute extra-axial fluid collections. No mass effect. Vascular: No hyperdense vessel or unexpected calcification. Skull: Normal. Negative for fracture or focal lesion. Sinuses/Orbits: No acute finding. Other: None. IMPRESSION: 1. Stable chronic small vessel ischemic changes. No acute intracranial process. Electronically Signed   By: Randa Ngo M.D.   On: 05/11/2020 18:44   DG C-ARM BRONCHOSCOPY  Result Date: 04/22/2020 C-ARM BRONCHOSCOPY: Fluoroscopy was utilized by the requesting physician.  No radiographic interpretation.    ASSESSMENT AND PLAN: This is a very pleasant 79 years old white female with a limited stage, stage IIIb (T2a, N2, M0) small cell lung cancer presented with right upper lobe lung mass in addition to mediastinal lymphadenopathy diagnosed in June 2021. I had a lengthy discussion with the patient and her daughters today about her current disease stage, prognosis and treatment options. I recommended for the patient treatment with systemic chemotherapy with carboplatin for AUC of 5 on day 1 and 2 etoposide 100 mg/M2 on days 1, 2 and 3 with Neulasta support.  I would also recommend for the patient concurrent radiotherapy but she is too weak to come daily for treatment at this point and we may have to do it at a later time. She is status post day 1 of cycle #1.  She missed day 2 and 3 because of hospitalization and unresponsiveness before and after the first treatment.  She is feeling much better now.  I discussed with her the option of discontinuing the  treatment versus resuming it again with close monitoring. She is interested in continuing her systemic chemotherapy.  She will come back for follow-up visit in 2 weeks for evaluation before starting cycle #2. I advised her not to take any Compazine or any other medications at the facility before coming to the cancer center. The patient was advised to call immediately if she has any concerning symptoms in the interval. The patient voices understanding of current disease status and treatment options and is in agreement with the current care plan.  All questions were answered. The patient knows to call the clinic with any problems, questions or concerns. We can certainly see the patient much sooner if necessary.  Disclaimer: This note was  dictated with voice recognition software. Similar sounding words can inadvertently be transcribed and may not be corrected upon review.

## 2020-05-20 ENCOUNTER — Encounter: Payer: Self-pay | Admitting: *Deleted

## 2020-05-20 NOTE — Progress Notes (Signed)
I received a message from financial advocate have questions about patient dx. I called and clarified this with her. She is working on financial support for patient and will speak to her at a later time.

## 2020-05-25 ENCOUNTER — Inpatient Hospital Stay: Payer: Medicare HMO | Attending: Internal Medicine

## 2020-05-25 ENCOUNTER — Other Ambulatory Visit: Payer: Self-pay

## 2020-05-25 DIAGNOSIS — C3411 Malignant neoplasm of upper lobe, right bronchus or lung: Secondary | ICD-10-CM | POA: Diagnosis present

## 2020-05-25 DIAGNOSIS — Z5189 Encounter for other specified aftercare: Secondary | ICD-10-CM | POA: Insufficient documentation

## 2020-05-25 DIAGNOSIS — Z5111 Encounter for antineoplastic chemotherapy: Secondary | ICD-10-CM | POA: Insufficient documentation

## 2020-05-25 LAB — CBC WITH DIFFERENTIAL (CANCER CENTER ONLY)
Abs Immature Granulocytes: 0.01 10*3/uL (ref 0.00–0.07)
Basophils Absolute: 0 10*3/uL (ref 0.0–0.1)
Basophils Relative: 0 %
Eosinophils Absolute: 0 10*3/uL (ref 0.0–0.5)
Eosinophils Relative: 1 %
HCT: 37.3 % (ref 36.0–46.0)
Hemoglobin: 12.6 g/dL (ref 12.0–15.0)
Immature Granulocytes: 0 %
Lymphocytes Relative: 41 %
Lymphs Abs: 1.1 10*3/uL (ref 0.7–4.0)
MCH: 31.3 pg (ref 26.0–34.0)
MCHC: 33.8 g/dL (ref 30.0–36.0)
MCV: 92.8 fL (ref 80.0–100.0)
Monocytes Absolute: 0.5 10*3/uL (ref 0.1–1.0)
Monocytes Relative: 18 %
Neutro Abs: 1.1 10*3/uL — ABNORMAL LOW (ref 1.7–7.7)
Neutrophils Relative %: 40 %
Platelet Count: 53 10*3/uL — ABNORMAL LOW (ref 150–400)
RBC: 4.02 MIL/uL (ref 3.87–5.11)
RDW: 13.6 % (ref 11.5–15.5)
WBC Count: 2.7 10*3/uL — ABNORMAL LOW (ref 4.0–10.5)
nRBC: 0 % (ref 0.0–0.2)

## 2020-05-25 LAB — CMP (CANCER CENTER ONLY)
ALT: 54 U/L — ABNORMAL HIGH (ref 0–44)
AST: 36 U/L (ref 15–41)
Albumin: 2.7 g/dL — ABNORMAL LOW (ref 3.5–5.0)
Alkaline Phosphatase: 101 U/L (ref 38–126)
Anion gap: 4 — ABNORMAL LOW (ref 5–15)
BUN: 10 mg/dL (ref 8–23)
CO2: 29 mmol/L (ref 22–32)
Calcium: 9.5 mg/dL (ref 8.9–10.3)
Chloride: 103 mmol/L (ref 98–111)
Creatinine: 0.63 mg/dL (ref 0.44–1.00)
GFR, Est AFR Am: >60 mL/min (ref >60)
GFR, Estimated: >60 mL/min (ref >60)
Glucose, Bld: 125 mg/dL — ABNORMAL HIGH (ref 70–99)
Potassium: 3.3 mmol/L — ABNORMAL LOW (ref 3.5–5.1)
Sodium: 136 mmol/L (ref 135–145)
Total Bilirubin: 0.6 mg/dL (ref 0.3–1.2)
Total Protein: 6.2 g/dL — ABNORMAL LOW (ref 6.5–8.1)

## 2020-05-26 ENCOUNTER — Ambulatory Visit: Payer: Medicare HMO | Admitting: Cardiovascular Disease

## 2020-05-29 NOTE — Progress Notes (Signed)
[facility] OFFICE PROGRESS NOTE  [name], MD [address] [place] [address]  DIAGNOSIS:  Limited stage, stage IIIb (T2 a, N2, M0) presented with right upper lobe lung mass in addition to mediastinal lymphadenopathy diagnosed in June 2021.  PRIOR THERAPY: None  CURRENT THERAPY: Systemic chemotherapy with carboplatin for AUC of 5 from day 1 and etoposide 100 mg/M2 on days 1, 2 and 3 with Neulasta support every 3 weeks.  First dose 05/11/2020.  Status post 1 cycle.  She received only day 1 of cycle #1. Patient is currently too weak to receive radiotherapy.   INTERVAL HISTORY: DRENA HAM 79 y.o. female returns  to the clinic today for a follow-up visit accompanied by her daughter.  The patient is feeling fair today without any concerning complaints.  The patient is a resident at [facility] facility.  The patient received day 1 of cycle #1 but missed days 2 and 3 secondary to a hospitalization for altered mental status.  She was hospitalized from 05/11/2020-05/13/20 she was found to have UTI and received antibiotics with Rocephin as well as a prescription for Keflex. She reportedly is being treated for another UTI with cipro. Her antihypertensives were also held during that time.  The patient then had a 1 week follow-up visit with Dr. Julien Nordmann who discussed stopping treatment versus continuing.  The patient opted to continue with cycle #2, which she is scheduled to receive cycle #2 today.  The patient requires transportation to her appointments which is costly and it is also challenging to get her here due to her poor mobility. They are inquiring if her weekly labs can be performed through her SNF or home health agency.   Besides the fatigue and generalized weakness the patient denies any recent fever, chills, night sweats, or weight loss.  She denies any shortness of breath except with with exertion.  She denies any cough, chest pain, or  hemoptysis.  She denies any nausea, vomiting, or constipation but reports some diarrhea since taking antibotics.  She denies any headaches or visual changes.  The patient is here today for evaluation and consideration before starting cycle #2 of treatment.  MEDICAL HISTORY: Past Medical History:  Diagnosis Date  . A-fib ([facility])   . Dysrhythmia   . GERD (gastroesophageal reflux disease)   . Hemorrhoids   . Hyperlipidemia   . Hypertension   . Lung cancer ([facility]) 2000   RLL  . Obesity   . Persistent atrial fibrillation ([facility])   . Rheumatoid arthritis ([facility])     ALLERGIES:  is allergic to morphine, morphine and related, and sulfa antibiotics.  MEDICATIONS:  Current Outpatient Medications  Medication Sig Dispense Refill  . warfarin (COUMADIN) 2.5 MG tablet Take 1 tablet (2.5 mg total) by mouth daily. (Patient taking differently: Take 2.5 mg by mouth daily. Takes on Tues, Wed, Thurs, Sat, Sun) 30 tablet 0  . warfarin (COUMADIN) 5 MG tablet Take 5 mg by mouth daily. Takes on Monday and Friday    . bisacodyl (DULCOLAX) 10 MG suppository Place 10 mg rectally See admin instructions. If not relieved by MOM give 10mg  Bisacodyl supp. Rectally for x1 dose in 24 hours prn constipation (Patient not taking: Reported on 06/01/2020)    . diclofenac Sodium (VOLTAREN) 1 % GEL Apply 4 g topically 4 (four) times daily. To bilateral knees and ankles (Patient not taking: Reported on 06/01/2020) 350 g 1  . levocetirizine (XYZAL) 5 MG tablet  Take 5 mg by mouth daily. (Patient not taking: Reported on 06/01/2020)    . Magnesium Hydroxide (BL MILK OF MAGNESIA PO) Take 30 mLs by mouth See admin instructions. If no BM in 3 day, give 30 cc Milk of magnesium p.o. x 1 dose in 24 hours as needed  (Patient not taking: Reported on 06/01/2020)    . metoprolol succinate (TOPROL-XL) 25 MG 24 hr tablet Take 12.5 mg by mouth at bedtime.  (Patient not taking: Reported on 06/01/2020)    . oxybutynin (DITROPAN-XL) 5 MG 24 hr tablet Take  5 mg by mouth at bedtime. (Patient not taking: Reported on 06/01/2020)    . prochlorperazine (COMPAZINE) 10 MG tablet One tab every 6 hours prn nausea-Sent to [facility] facility on pt's paperwork per [employer] PA. (Patient not taking: Reported on 06/01/2020) 30 tablet prn  . saccharomyces boulardii (FLORASTOR) 250 MG capsule Take 1 capsule (250 mg total) by mouth 2 (two) times daily. 60 capsule 0  . simvastatin (ZOCOR) 20 MG tablet Take 20 mg by mouth at bedtime. (Patient not taking: Reported on 06/01/2020)    . Sodium Phosphates (RA SALINE ENEMA RE) Place 1 Container rectally See admin instructions. If not relieved by bisacodyl supp. Give disposable saline enema rectally x1 dose 24 hours prn constipation (Patient not taking: Reported on 06/01/2020)    . timolol (TIMOPTIC) 0.5 % ophthalmic solution Place 1 drop into both eyes at bedtime. (Patient not taking: Reported on 06/01/2020)    . vitamin B-12 1000 MCG tablet Take 1 tablet (1,000 mcg total) by mouth daily. (Patient not taking: Reported on 06/01/2020) 30 tablet 0   No current facility-administered medications for this visit.   Facility-Administered Medications Ordered in Other Visits  Medication Dose Route Frequency Provider Last Rate Last Admin  . 0.9 %  sodium chloride infusion   Intravenous Once Curt Bears, MD      . CARBOplatin (PARAPLATIN) 410 mg in sodium chloride 0.9 % 250 mL chemo infusion  410 mg Intravenous Once Curt Bears, MD      . etoposide (VEPESID) 170 mg in sodium chloride 0.9 % 500 mL chemo infusion  80 mg/m2 (Treatment Plan Recorded) Intravenous Once Curt Bears, MD        SURGICAL HISTORY:  Past Surgical History:  Procedure Laterality Date  . BREAST EXCISIONAL BIOPSY Left    x 2  . BREAST EXCISIONAL BIOPSY Right   . BRONCHIAL BIOPSY  02/18/2020   Procedure: BRONCHIAL BIOPSIES;  Surgeon: Collene Gobble, MD;  Location: [facility] ENDOSCOPY;  Service: Cardiopulmonary;;  . BRONCHIAL BRUSHINGS  02/18/2020    Procedure: BRONCHIAL BRUSHINGS;  Surgeon: Collene Gobble, MD;  Location: [facility];  Service: Cardiopulmonary;;  . BRONCHIAL NEEDLE ASPIRATION BIOPSY  02/18/2020   Procedure: BRONCHIAL NEEDLE ASPIRATION BIOPSIES;  Surgeon: Collene Gobble, MD;  Location: [facility];  Service: Cardiopulmonary;;  . BRONCHIAL WASHINGS  02/18/2020   Procedure: BRONCHIAL WASHINGS;  Surgeon: Collene Gobble, MD;  Location: [facility];  Service: Cardiopulmonary;;  . CARDIOVERSION N/A 01/09/2018   Procedure: CARDIOVERSION;  Surgeon: Josue Hector, MD;  Location: [facility] ENDOSCOPY;  Service: Cardiovascular;  Laterality: N/A;  . CARDIOVERSION N/A 03/01/2018   Procedure: CARDIOVERSION;  Surgeon: Sueanne Margarita, MD;  Location: [facility] ENDOSCOPY;  Service: Cardiovascular;  Laterality: N/A;  . ELECTROMAGNETIC NAVIGATION BROCHOSCOPY N/A 02/18/2020   Procedure: ELECTROMAGNETIC NAVIGATION BRONCHOSCOPY;  Surgeon: Collene Gobble, MD;  Location: [facility];  Service: Cardiopulmonary;  Laterality: N/A;  . KNEE SURGERY    .  LUNG LOBECTOMY Right 2000   RLL removed  . VIDEO BRONCHOSCOPY N/A 02/18/2020   Procedure: VIDEO BRONCHOSCOPY WITH FLUORO;  Surgeon: Collene Gobble, MD;  Location[facility] ENDOSCOPY;  Service: Cardiopulmonary;  Laterality: N/A;  . VIDEO BRONCHOSCOPY WITH ENDOBRONCHIAL ULTRASOUND N/A 04/22/2020   Procedure: VIDEO BRONCHOSCOPY WITH ENDOBRONCHIAL ULTRASOUND;  Surgeon: [name], MD;  Location: [facility] OR;  Service: Thoracic;  Laterality: N/A;    REVIEW OF SYSTEMS:   Review of Systems  Constitutional: Positive for fatigue and generalized weakness.  Negative for appetite change, chills,  fever and unexpected weight change.  HENT: Negative for mouth sores, nosebleeds, sore throat and trouble swallowing.   Eyes: Negative for eye problems and icterus.  Respiratory: Positive for dyspnea on exertion.  Negative for cough, hemoptysis, and wheezing.   Cardiovascular: Negative for chest pain and leg swelling.   Gastrointestinal: Positive for some diarrhea.  Negative for abdominal pain, constipation,  nausea and vomiting.  Genitourinary: Negative for bladder incontinence, difficulty urinating, dysuria, frequency and hematuria.   Musculoskeletal: Negative for back pain, gait problem, neck pain and neck stiffness.  Skin: Negative for itching and rash.  Neurological: Negative for dizziness, extremity weakness, gait problem, headaches, light-headedness and seizures.  Hematological: Negative for adenopathy. Does not bruise/bleed easily.  Psychiatric/Behavioral: Negative for confusion, depression and sleep disturbance. The patient is not nervous/anxious.     PHYSICAL EXAMINATION:  Blood pressure 140/80, pulse 89, temperature 97.8 F (36.6 C), temperature source Tympanic, resp. rate 18, height 5\' 1"  (1.549 m), SpO2 99 %.  ECOG PERFORMANCE STATUS: 3 - Symptomatic, >50% confined to bed  Physical Exam  Constitutional: Oriented to person, place, and time and well-developed, well-nourished, and in no distress.  HENT:  Head: Normocephalic and atraumatic.  Mouth/Throat: Oropharynx is clear and moist. No oropharyngeal exudate.  Eyes: Conjunctivae are normal. Right eye exhibits no discharge. Left eye exhibits no discharge. No scleral icterus.  Neck: Normal range of motion. Neck supple.  Cardiovascular: Normal rate, regular rhythm, normal heart sounds and intact distal pulses.   Pulmonary/Chest: Effort normal and breath sounds normal. No respiratory distress. No wheezes. No rales.  Abdominal: Soft. Bowel sounds are normal. Exhibits no distension and no mass. There is no tenderness.  Musculoskeletal: Normal range of motion. Exhibits no edema.  Lymphadenopathy:    No cervical adenopathy.  Neurological: Alert and oriented to person, place, and time. Exhibits muscle wasting.  Examined in the wheelchair.   Skin: Skin is warm and dry. No rash noted. Not diaphoretic. No erythema. No pallor.  Psychiatric: Mood,  memory and judgment normal.  Vitals reviewed.  LABORATORY DATA: Lab Results  Component Value Date   WBC 3.3 (L) 06/01/2020   HGB 13.9 06/01/2020   HCT 40.6 06/01/2020   MCV 92.9 06/01/2020   PLT 212 06/01/2020      Chemistry      Component Value Date/Time   NA 135 06/01/2020 1025   NA 136 (A) 04/30/2020 0000   K 3.4 (L) 06/01/2020 1025   CL 102 06/01/2020 1025   CO2 25 06/01/2020 1025   BUN 6 (L) 06/01/2020 1025   BUN 19 04/30/2020 0000   CREATININE 0.58 06/01/2020 1025   GLU 93 04/30/2020 0000      Component Value Date/Time   CALCIUM 9.8 06/01/2020 1025   ALKPHOS 95 06/01/2020 1025   AST 29 06/01/2020 1025   ALT 38 06/01/2020 1025   BILITOT 0.6 06/01/2020 1025       RADIOGRAPHIC STUDIES:  CT Head Wo  Contrast  Result Date: 05/11/2020 CLINICAL DATA:  Somnolent, altered level of consciousness, right upper lobe non-small cell lung cancer EXAM: CT HEAD WITHOUT CONTRAST TECHNIQUE: Contiguous axial images were obtained from the base of the skull through the vertex without intravenous contrast. COMPARISON:  02/07/2020, 02/05/2020 FINDINGS: Brain: Chronic small vessel ischemic changes are seen throughout the periventricular and subcortical white matter, stable. 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 No signs of acute infarct or hemorrhage. Lateral ventricles and midline structures are otherwise unremarkable. No acute extra-axial fluid collections. No mass effect. Vascular: No hyperdense vessel or unexpected calcification. Skull: Normal. Negative for fracture or focal lesion. Sinuses/Orbits: No acute finding. Other: None. IMPRESSION: 1. Stable chronic small vessel ischemic changes. No acute intracranial process. Electronically Signed   By: Randa Ngo M.D.   On: 05/11/2020 18:44     ASSESSMENT/PLAN:  This is a very pleasant 79 year old Caucasian female with limited stage, stage IIIb (T2a, N2, M0) small cell lung cancer.  She  presented with a right upper lobe lung mass in addition to mediastinal lymphadenopathy.  She was diagnosed in June 2021.  The patient was seen with Dr. Julien Nordmann today.  The patient is currently undergoing treatment with carboplatin for an AUC of 5 on day 1, and etoposide 100 mg per metered squared on days 1, 2, and 3 IV every 3 weeks with Neulasta support.  The patient is only receive day 1 of cycle 1 due to being hospitalized during day 2 and day 3.  Dr. Julien Nordmann would typically recommend for the patient to receive concurrent radiotherapy; however, she is too weak and we may consider radiotherapy at another point in time.  Dr. Julien Nordmann again had a lengthy discussion with the patient about her current condition and recommended treatment options.  The patient wishes to proceed with cycle #2 today as scheduled.  The patient will proceed with cycle #2 today as scheduled; however, Dr. Julien Nordmann will reduce the dose to carboplatin for an AUC of 4 and etoposide 80 mg per metered squared.  The patient was advised to take Claritin 4 to 10 days after receiving her Neulasta injection which is scheduled for Friday.  We will see the patient back for follow-up visit in 3 weeks for evaluation before starting cycle #3.  We will arrange for the patient to have a restaging CT scan of her chest performed prior to starting her next cycle of treatment.   Due to challenges with mobility, it is challenging for the patient to get here for her weekly lab appointments.  We will try to arrange for the patient to have her weekly lab work done either through her skilled nursing facility or through home health.  The patient was advised to call immediately if she has any concerning symptoms in the interval. The patient voices understanding of current disease status and treatment options and is in agreement with the current care plan. All questions were answered. The patient knows to call the clinic with any problems, questions or  concerns. We can certainly see the patient much sooner if necessary     Orders Placed This Encounter  Procedures  . CT Chest W Contrast    Please schedule with [name], the patient's daughter as the patient currently lives in [facility]    Standing Status:   Future    Standing Expiration Date:   06/01/2021    Scheduling Instructions:     Please schedule with [name], the patient's daughter as  the patient currently lives in [facility]    Order Specific Question:   If indicated for the ordered procedure, I authorize the administration of contrast media per Radiology protocol    Answer:   Yes    Order Specific Question:   Preferred imaging location?    Answer:   [place], [facility] 06/01/20   ADDENDUM: Hematology/oncology Attending: I had a face-to-face encounter with the patient today.  I recommended her care plan.  This is a very pleasant 79 years old white female with limited stage small cell lung cancer.  The patient started the first cycle of her treatment 3 weeks ago but she received only carboplatin on day 1.  The remaining portion of the cycle was discontinued secondary to hospitalization for unresponsiveness during her treatment.  The patient recovered well and she is feeling much better today. I recommended for her to proceed with cycle #2 today as planned but I will reduce the dose of carboplatin to AUC of 4 and etoposide 80 mg/M2. We will see her back for follow-up visit in 3 weeks for evaluation with repeat CT scan of the chest for restaging of her disease. The patient was advised to call immediately if she has any concerning symptoms in the interval.  Disclaimer: This note was dictated with voice recognition software. Similar sounding words can inadvertently be transcribed and may be missed upon review. Eilleen Kempf, MD 06/01/20  The patient was advised to call immediately if she has any concerning symptoms in the  interval.

## 2020-06-01 ENCOUNTER — Encounter: Payer: Self-pay | Admitting: Internal Medicine

## 2020-06-01 ENCOUNTER — Inpatient Hospital Stay: Payer: Medicare HMO | Admitting: Physician Assistant

## 2020-06-01 ENCOUNTER — Other Ambulatory Visit: Payer: Self-pay

## 2020-06-01 ENCOUNTER — Encounter: Payer: Self-pay | Admitting: Physician Assistant

## 2020-06-01 ENCOUNTER — Inpatient Hospital Stay: Payer: Medicare HMO

## 2020-06-01 VITALS — BP 179/77 | HR 64 | Temp 97.8°F

## 2020-06-01 VITALS — BP 140/80 | HR 89 | Temp 97.8°F | Resp 18 | Ht 61.0 in

## 2020-06-01 DIAGNOSIS — C3411 Malignant neoplasm of upper lobe, right bronchus or lung: Secondary | ICD-10-CM | POA: Diagnosis not present

## 2020-06-01 DIAGNOSIS — Z5111 Encounter for antineoplastic chemotherapy: Secondary | ICD-10-CM | POA: Diagnosis not present

## 2020-06-01 LAB — CBC WITH DIFFERENTIAL (CANCER CENTER ONLY)
Abs Immature Granulocytes: 0.01 10*3/uL (ref 0.00–0.07)
Basophils Absolute: 0.1 10*3/uL (ref 0.0–0.1)
Basophils Relative: 2 %
Eosinophils Absolute: 0 10*3/uL (ref 0.0–0.5)
Eosinophils Relative: 1 %
HCT: 40.6 % (ref 36.0–46.0)
Hemoglobin: 13.9 g/dL (ref 12.0–15.0)
Immature Granulocytes: 0 %
Lymphocytes Relative: 35 %
Lymphs Abs: 1.2 10*3/uL (ref 0.7–4.0)
MCH: 31.8 pg (ref 26.0–34.0)
MCHC: 34.2 g/dL (ref 30.0–36.0)
MCV: 92.9 fL (ref 80.0–100.0)
Monocytes Absolute: 1 10*3/uL (ref 0.1–1.0)
Monocytes Relative: 32 %
Neutro Abs: 1 10*3/uL — ABNORMAL LOW (ref 1.7–7.7)
Neutrophils Relative %: 30 %
Platelet Count: 212 10*3/uL (ref 150–400)
RBC: 4.37 MIL/uL (ref 3.87–5.11)
RDW: 14.5 % (ref 11.5–15.5)
WBC Count: 3.3 10*3/uL — ABNORMAL LOW (ref 4.0–10.5)
nRBC: 0 % (ref 0.0–0.2)

## 2020-06-01 LAB — CMP (CANCER CENTER ONLY)
ALT: 38 U/L (ref 0–44)
AST: 29 U/L (ref 15–41)
Albumin: 3 g/dL — ABNORMAL LOW (ref 3.5–5.0)
Alkaline Phosphatase: 95 U/L (ref 38–126)
Anion gap: 8 (ref 5–15)
BUN: 6 mg/dL — ABNORMAL LOW (ref 8–23)
CO2: 25 mmol/L (ref 22–32)
Calcium: 9.8 mg/dL (ref 8.9–10.3)
Chloride: 102 mmol/L (ref 98–111)
Creatinine: 0.58 mg/dL (ref 0.44–1.00)
GFR, Estimated: >60 mL/min (ref >60)
Glucose, Bld: 77 mg/dL (ref 70–99)
Potassium: 3.4 mmol/L — ABNORMAL LOW (ref 3.5–5.1)
Sodium: 135 mmol/L (ref 135–145)
Total Bilirubin: 0.6 mg/dL (ref 0.3–1.2)
Total Protein: 6.8 g/dL (ref 6.5–8.1)

## 2020-06-01 MED ORDER — SODIUM CHLORIDE 0.9 % IV SOLN
10.0000 mg | Freq: Once | INTRAVENOUS | Status: AC
  Administered 2020-06-01: 10 mg via INTRAVENOUS
  Filled 2020-06-01: qty 10

## 2020-06-01 MED ORDER — PALONOSETRON HCL INJECTION 0.25 MG/5ML
0.2500 mg | Freq: Once | INTRAVENOUS | Status: AC
  Administered 2020-06-01: 0.25 mg via INTRAVENOUS

## 2020-06-01 MED ORDER — SODIUM CHLORIDE 0.9 % IV SOLN
80.0000 mg/m2 | Freq: Once | INTRAVENOUS | Status: AC
  Administered 2020-06-01: 170 mg via INTRAVENOUS
  Filled 2020-06-01: qty 8.5

## 2020-06-01 MED ORDER — SODIUM CHLORIDE 0.9 % IV SOLN
150.0000 mg | Freq: Once | INTRAVENOUS | Status: AC
  Administered 2020-06-01: 150 mg via INTRAVENOUS
  Filled 2020-06-01: qty 150

## 2020-06-01 MED ORDER — SODIUM CHLORIDE 0.9 % IV SOLN
410.0000 mg | Freq: Once | INTRAVENOUS | Status: AC
  Administered 2020-06-01: 410 mg via INTRAVENOUS
  Filled 2020-06-01: qty 41

## 2020-06-01 MED ORDER — PALONOSETRON HCL INJECTION 0.25 MG/5ML
INTRAVENOUS | Status: AC
  Filled 2020-06-01: qty 5

## 2020-06-01 MED ORDER — SODIUM CHLORIDE 0.9 % IV SOLN
Freq: Once | INTRAVENOUS | Status: DC
  Filled 2020-06-01: qty 250

## 2020-06-01 NOTE — Progress Notes (Signed)
Faxed over request to [place] to [phone] for weekly CBC and CMP to be performed at [facility] for efficiency and less stress on the patient.  Requested returned results to be faxed to [phone]

## 2020-06-01 NOTE — Patient Instructions (Signed)
[facility] Discharge Instructions for Patients Receiving Chemotherapy  Today you received the following chemotherapy agents: Carboplatin, etoposide   To help prevent nausea and vomiting after your treatment, we encourage you to take your nausea medication as directed.    If you develop nausea and vomiting that is not controlled by your nausea medication, call the clinic.   BELOW ARE SYMPTOMS THAT SHOULD BE REPORTED IMMEDIATELY:  *FEVER GREATER THAN 100.5 F  *CHILLS WITH OR WITHOUT FEVER  NAUSEA AND VOMITING THAT IS NOT CONTROLLED WITH YOUR NAUSEA MEDICATION  *UNUSUAL SHORTNESS OF BREATH  *UNUSUAL BRUISING OR BLEEDING  TENDERNESS IN MOUTH AND THROAT WITH OR WITHOUT PRESENCE OF ULCERS  *URINARY PROBLEMS  *BOWEL PROBLEMS  UNUSUAL RASH Items with * indicate a potential emergency and should be followed up as soon as possible.  Feel free to call the clinic should you have any questions or concerns. The clinic phone number is [phone].  Please show the [facility] at check-in to the Emergency Department and triage nurse.

## 2020-06-01 NOTE — Progress Notes (Signed)
Met w/ pt to introduce myself as her Arboriculturist, discuss copay assistance and the [employer].  Her step daughter is going to check with her ins company to make sure they will cover her treatment at 100% as well as check on pt's household income to see if she qualifies for the [employer].  She has my card to contact me if pt would like to apply for copay assistance and the [employer].

## 2020-06-01 NOTE — Progress Notes (Signed)
Per Cassandra Heilingoetter PA, OK to treat with ANC 1.0. Patient was lethargic throughout most of treatment, but was arousable to physical stimulation. Pushed back staff member's hand when trying to stimulate at sternum, and said "stop" when stimulating feet. Pt's daughter [name] was called and asked about pt's baseline. Per [name], pt has been sleeping a lot lately at the facility and this is not new to her, especially since she's been having UTI's. Per [name], pt sleeps very deep. At completion of treatment, patient was still sleeping with difficulty to arouse. Daughter was called and came in to help arouse patient. Patient was arousable and responded to physical and verbal stimulation, opening eyes. Vitals signs stable. Patient was transfered back to wheelchair with hoyer lift and discharged back to facility via [employer] transportation.

## 2020-06-02 ENCOUNTER — Inpatient Hospital Stay: Payer: Medicare HMO

## 2020-06-02 ENCOUNTER — Other Ambulatory Visit: Payer: Self-pay

## 2020-06-02 VITALS — BP 135/75 | HR 89 | Temp 97.7°F | Resp 18

## 2020-06-02 DIAGNOSIS — C3411 Malignant neoplasm of upper lobe, right bronchus or lung: Secondary | ICD-10-CM

## 2020-06-02 DIAGNOSIS — Z5111 Encounter for antineoplastic chemotherapy: Secondary | ICD-10-CM | POA: Diagnosis not present

## 2020-06-02 MED ORDER — SODIUM CHLORIDE 0.9 % IV SOLN
80.0000 mg/m2 | Freq: Once | INTRAVENOUS | Status: AC
  Administered 2020-06-02: 170 mg via INTRAVENOUS
  Filled 2020-06-02: qty 8.5

## 2020-06-02 MED ORDER — SODIUM CHLORIDE 0.9 % IV SOLN
10.0000 mg | Freq: Once | INTRAVENOUS | Status: AC
  Administered 2020-06-02: 10 mg via INTRAVENOUS
  Filled 2020-06-02: qty 10

## 2020-06-02 MED ORDER — SODIUM CHLORIDE 0.9 % IV SOLN
Freq: Once | INTRAVENOUS | Status: AC
  Filled 2020-06-02: qty 250

## 2020-06-02 NOTE — Patient Instructions (Signed)
[facility] Discharge Instructions for Patients Receiving Chemotherapy  Today you received the following chemotherapy agents: Etoposide  To help prevent nausea and vomiting after your treatment, we encourage you to take your nausea medication  as prescribed.    If you develop nausea and vomiting that is not controlled by your nausea medication, call the clinic.   BELOW ARE SYMPTOMS THAT SHOULD BE REPORTED IMMEDIATELY:  *FEVER GREATER THAN 100.5 F  *CHILLS WITH OR WITHOUT FEVER  NAUSEA AND VOMITING THAT IS NOT CONTROLLED WITH YOUR NAUSEA MEDICATION  *UNUSUAL SHORTNESS OF BREATH  *UNUSUAL BRUISING OR BLEEDING  TENDERNESS IN MOUTH AND THROAT WITH OR WITHOUT PRESENCE OF ULCERS  *URINARY PROBLEMS  *BOWEL PROBLEMS  UNUSUAL RASH Items with * indicate a potential emergency and should be followed up as soon as possible.  Feel free to call the clinic should you have any questions or concerns. The clinic phone number is [phone].  Please show the [facility] at check-in to the Emergency Department and triage nurse.

## 2020-06-03 ENCOUNTER — Other Ambulatory Visit: Payer: Self-pay

## 2020-06-03 ENCOUNTER — Telehealth: Payer: Self-pay | Admitting: Cardiovascular Disease

## 2020-06-03 ENCOUNTER — Encounter: Payer: Self-pay | Admitting: Family

## 2020-06-03 ENCOUNTER — Non-Acute Institutional Stay (SKILLED_NURSING_FACILITY): Payer: Medicare HMO | Admitting: Family

## 2020-06-03 ENCOUNTER — Inpatient Hospital Stay: Payer: Medicare HMO

## 2020-06-03 VITALS — BP 156/61 | HR 55 | Temp 97.7°F | Resp 18

## 2020-06-03 DIAGNOSIS — I10 Essential (primary) hypertension: Secondary | ICD-10-CM

## 2020-06-03 DIAGNOSIS — M069 Rheumatoid arthritis, unspecified: Secondary | ICD-10-CM | POA: Diagnosis not present

## 2020-06-03 DIAGNOSIS — Z1159 Encounter for screening for other viral diseases: Secondary | ICD-10-CM

## 2020-06-03 DIAGNOSIS — E782 Mixed hyperlipidemia: Secondary | ICD-10-CM | POA: Diagnosis not present

## 2020-06-03 DIAGNOSIS — E538 Deficiency of other specified B group vitamins: Secondary | ICD-10-CM

## 2020-06-03 DIAGNOSIS — Z23 Encounter for immunization: Secondary | ICD-10-CM

## 2020-06-03 DIAGNOSIS — I4821 Permanent atrial fibrillation: Secondary | ICD-10-CM | POA: Diagnosis not present

## 2020-06-03 DIAGNOSIS — C3411 Malignant neoplasm of upper lobe, right bronchus or lung: Secondary | ICD-10-CM

## 2020-06-03 DIAGNOSIS — Z5111 Encounter for antineoplastic chemotherapy: Secondary | ICD-10-CM | POA: Diagnosis not present

## 2020-06-03 MED ORDER — SODIUM CHLORIDE 0.9 % IV SOLN
80.0000 mg/m2 | Freq: Once | INTRAVENOUS | Status: AC
  Administered 2020-06-03: 170 mg via INTRAVENOUS
  Filled 2020-06-03: qty 8.5

## 2020-06-03 MED ORDER — SODIUM CHLORIDE 0.9 % IV SOLN
Freq: Once | INTRAVENOUS | Status: AC
  Filled 2020-06-03: qty 250

## 2020-06-03 MED ORDER — SODIUM CHLORIDE 0.9 % IV SOLN
10.0000 mg | Freq: Once | INTRAVENOUS | Status: AC
  Administered 2020-06-03: 10 mg via INTRAVENOUS
  Filled 2020-06-03: qty 10

## 2020-06-03 NOTE — Patient Instructions (Signed)
[facility] Discharge Instructions for Patients Receiving Chemotherapy  Today you received the following chemotherapy agents: Etoposide  To help prevent nausea and vomiting after your treatment, we encourage you to take your nausea medication  as prescribed.    If you develop nausea and vomiting that is not controlled by your nausea medication, call the clinic.   BELOW ARE SYMPTOMS THAT SHOULD BE REPORTED IMMEDIATELY:  *FEVER GREATER THAN 100.5 F  *CHILLS WITH OR WITHOUT FEVER  NAUSEA AND VOMITING THAT IS NOT CONTROLLED WITH YOUR NAUSEA MEDICATION  *UNUSUAL SHORTNESS OF BREATH  *UNUSUAL BRUISING OR BLEEDING  TENDERNESS IN MOUTH AND THROAT WITH OR WITHOUT PRESENCE OF ULCERS  *URINARY PROBLEMS  *BOWEL PROBLEMS  UNUSUAL RASH Items with * indicate a potential emergency and should be followed up as soon as possible.  Feel free to call the clinic should you have any questions or concerns. The clinic phone number is [phone].  Please show the [facility] at check-in to the Emergency Department and triage nurse.

## 2020-06-03 NOTE — Telephone Encounter (Signed)
Left message to get more information  - call back to speak to any triage nurse

## 2020-06-03 NOTE — Telephone Encounter (Signed)
New message:    [facility] calling stating that the patient has been out of it and would like for the patient to be seen sooner, patient was in the hospital before 05/21/20. They is no apt for the doctor or the APS

## 2020-06-03 NOTE — Progress Notes (Signed)
Location:    [place].   Nursing Home Room Number: 211-D Place of Service:  SNF (31) Provider:  Marlowe Sax, NP   Patient Care Team: [name], MD as PCP - General (Family Medicine) Lorretta Harp, MD as PCP - Cardiology (Cardiology) Gavin Pound, MD as Consulting Physician (Rheumatology) Valrie Hart, RN as Oncology Nurse Navigator  Extended Emergency Contact Information Primary Emergency Contact: [name] Mobile Phone: [phone] Relation: Daughter Secondary Emergency Contact: [name] Mobile Phone: [phone] Relation: Daughter Preferred language: English Interpreter needed? No  Code Status:  Full Code  Goals of care: Advanced Directive information Advanced Directives 06/03/2020  Does Patient Have a Medical Advance Directive? No  Type of Advance Directive -  Does patient want to make changes to medical advance directive? No - Patient declined  Copy of [place] in Chart? -  Would patient like information on creating a medical advance directive? -     Chief Complaint  Patient presents with   Medical Management of Chronic Issues    Routine Visit.   Health Maintenance    Discuss the need for Hepatitis C Screening    Immunizations    Discuss the need for Tetanus Vaccine.    HPI:  Pt is a 79 y.o. 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  She continues to follow up with Oncologist Dr.Mohamed at [facility] for small lung cancer infusion last seen 06/01/2020.   Her weight has been stable.she describes her appetite as fair.Has had no acute illness or fall  episode.denies any pain.   Blood pressure readings ranging in the 110's/60's - 130's/60's and HR in the 70's- 80's.she denies any headache,dizziness or shortness of breath. States uses her oxygen just as needed.   She is due for Tdap vaccination. Also due for hep C screening.    Past Medical History:  Diagnosis Date   A-fib [facility])    Dysrhythmia    GERD (gastroesophageal reflux disease)    Hemorrhoids    Hyperlipidemia    Hypertension    Lung cancer ([place]) 2000   RLL   Obesity    Persistent atrial fibrillation (HCC)    Rheumatoid arthritis ([place])    Past Surgical History:  Procedure Laterality Date   BREAST EXCISIONAL BIOPSY Left    x 2   BREAST EXCISIONAL BIOPSY Right    BRONCHIAL BIOPSY  02/18/2020   Procedure: BRONCHIAL BIOPSIES;  Surgeon: Collene Gobble, MD;  Location: [place];  Service: Cardiopulmonary;;   BRONCHIAL BRUSHINGS  02/18/2020   Procedure: BRONCHIAL BRUSHINGS;  Surgeon: Collene Gobble, MD;  Location: [place];  Service: Cardiopulmonary;;   BRONCHIAL NEEDLE ASPIRATION BIOPSY  02/18/2020   Procedure: BRONCHIAL NEEDLE ASPIRATION BIOPSIES;  Surgeon: Collene Gobble, MD;  Location: [place];  Service: Cardiopulmonary;;   BRONCHIAL WASHINGS  02/18/2020   Procedure: BRONCHIAL WASHINGS;  Surgeon: Collene Gobble, MD;  Location: [place];  Service: Cardiopulmonary;;   CARDIOVERSION N/A 01/09/2018   Procedure: CARDIOVERSION;  Surgeon: Josue Hector, MD;  Location: [facility] ENDOSCOPY;  Service: Cardiovascular;  Laterality: N/A;   CARDIOVERSION N/A 03/01/2018   Procedure: CARDIOVERSION;  Surgeon: Sueanne Margarita, MD;  Location: [place];  Service: Cardiovascular;  Laterality:  N/A;   ELECTROMAGNETIC NAVIGATION BROCHOSCOPY N/A 02/18/2020   Procedure: ELECTROMAGNETIC NAVIGATION BRONCHOSCOPY;  Surgeon: Collene Gobble, MD;  Location: [place];  Service: Cardiopulmonary;  Laterality: N/A;   KNEE SURGERY     LUNG LOBECTOMY Right 2000   RLL  removed   VIDEO BRONCHOSCOPY N/A 02/18/2020   Procedure: VIDEO BRONCHOSCOPY WITH FLUORO;  Surgeon: Collene Gobble, MD;  Location: [place];  Service: Cardiopulmonary;  Laterality: N/A;   VIDEO BRONCHOSCOPY WITH ENDOBRONCHIAL ULTRASOUND N/A 04/22/2020   Procedure: VIDEO BRONCHOSCOPY WITH ENDOBRONCHIAL ULTRASOUND;  Surgeon: [name], MD;  Location: [facility] OR;  Service: Thoracic;  Laterality: N/A;    Allergies  Allergen Reactions   Morphine Anaphylaxis   Morphine And Related Other (See Comments)    "vitals go down", pass out   Sulfa Antibiotics Rash    Allergies as of 06/03/2020      Reactions   Morphine Anaphylaxis   Morphine And Related Other (See Comments)   "vitals go down", pass out   Sulfa Antibiotics Rash      Medication List       Accurate as of June 03, 2020 10:03 AM. If you have any questions, ask your nurse or doctor.        STOP taking these medications   oxybutynin 5 MG 24 hr tablet Commonly known as: DITROPAN-XL Stopped by: Sandrea Hughs, NP   prochlorperazine 10 MG tablet Commonly known as: COMPAZINE Stopped by: Sandrea Hughs, NP   saccharomyces boulardii 250 MG capsule Commonly known as: FLORASTOR Stopped by: Sandrea Hughs, NP     TAKE these medications   bisacodyl 10 MG suppository Commonly known as: DULCOLAX Place 10 mg rectally See admin instructions. If not relieved by MOM give 10mg  Bisacodyl supp. Rectally for x1 dose in 24 hours prn constipation   BL MILK OF MAGNESIA PO Take 30 mLs by mouth See admin instructions. If no BM in 3 day, give 30 cc Milk of magnesium p.o. x 1 dose in 24 hours as needed   cyanocobalamin 1000 MCG tablet Take 1 tablet (1,000 mcg total) by mouth daily.   diclofenac Sodium 1 % Gel Commonly known as: Voltaren Apply 4 g topically 4 (four) times daily. To bilateral knees and ankles   gabapentin 100 MG capsule Commonly known as: NEURONTIN Take 100 mg by mouth at bedtime.   levocetirizine 5 MG  tablet Commonly known as: XYZAL Take 5 mg by mouth daily.   metoprolol succinate 25 MG 24 hr tablet Commonly known as: TOPROL-XL Take 12.5 mg by mouth at bedtime.   MYRBETRIQ PO Take 25 mg by mouth daily.   RA SALINE ENEMA RE Place 1 Container rectally See admin instructions. If not relieved by bisacodyl supp. Give disposable saline enema rectally x1 dose 24 hours prn constipation   simvastatin 20 MG tablet Commonly known as: ZOCOR Take 20 mg by mouth at bedtime.   timolol 0.5 % ophthalmic solution Commonly known as: TIMOPTIC Place 1 drop into both eyes at bedtime.   warfarin 2.5 MG tablet Commonly known as: COUMADIN Take as directed by the anticoagulation clinic. If you are unsure how to take this medication, talk to your nurse or doctor. Original instructions: Take 2.5 mg by mouth daily. Tuesdays, Wednesdays, Thursdays, Saturdays, and Sundays. What changed: Another medication with the same name was removed. Continue taking this medication, and follow the directions you see here. Changed by: Sandrea Hughs, NP   warfarin 2.5 MG tablet Commonly known as:  COUMADIN Take as directed by the anticoagulation clinic. If you are unsure how to take this medication, talk to your nurse or doctor. Original instructions: Take 2.5 mg by mouth daily. On Tuesdays, Wednesdays, Thursdays, Saturdays, and Sundays. What changed: Another medication with the same name was removed. Continue taking this medication, and follow the directions you see here. Changed by: Sandrea Hughs, NP   warfarin 5 MG tablet Commonly known as: COUMADIN Take as directed by the anticoagulation clinic. If you are unsure how to take this medication, talk to your nurse or doctor. Original instructions: Take 5 mg by mouth 2 (two) times a week. On Mondays, and Fridays. What changed: Another medication with the same name was removed. Continue taking this medication, and follow the directions you see here. Changed by: Sandrea Hughs, NP       Review of Systems  Constitutional: Negative for appetite change, chills, fatigue and fever.  HENT: Negative for congestion, rhinorrhea, sinus pressure, sinus pain, sneezing, sore throat and trouble swallowing.   Eyes: Negative for discharge, redness and itching.  Respiratory: Negative for cough, chest tightness, shortness of breath and wheezing.   Cardiovascular: Negative for chest pain, palpitations and leg swelling.  Gastrointestinal: Negative for abdominal distention, abdominal pain, constipation, diarrhea, nausea and vomiting.  Endocrine: Negative for cold intolerance, heat intolerance, polydipsia, polyphagia and polyuria.  Genitourinary: Negative for difficulty urinating, dysuria, flank pain and urgency.  Musculoskeletal: Positive for gait problem. Negative for joint swelling and myalgias.  Skin: Negative for color change, pallor and rash.  Neurological: Negative for dizziness, speech difficulty, light-headedness, numbness and headaches.  Hematological: Does not bruise/bleed easily.  Psychiatric/Behavioral: Negative for agitation, behavioral problems, confusion and sleep disturbance. The patient is not nervous/anxious.     Immunization History  Administered Date(s) Administered   Influenza, High Dose Seasonal PF 06/27/2016, 05/23/2017, 06/08/2018   Moderna SARS-COVID-2 Vaccination 11/12/2019, 12/10/2019   Pneumococcal Conjugate-13 01/04/2017   Pneumococcal Polysaccharide-23 01/17/2018   Pertinent  Health Maintenance Due  Topic Date Due   INFLUENZA VACCINE  08/21/2020 (Originally 03/22/2020)   DEXA SCAN  Completed   PNA vac Low Risk Adult  Completed   No flowsheet data found. Functional Status Survey:    Vitals:   06/03/20 0951  BP: 138/87  Pulse: 79  Resp: 18  Temp: (!) 97.2 F (36.2 C)  Weight: 229 lb (103.9 kg)  Height: 5\' 1"  (1.549 m)   Body mass index is 43.27 kg/m. Physical Exam Vitals and nursing note reviewed.  Constitutional:       General: She is not in acute distress.    Appearance: She is morbidly obese. She is not ill-appearing.  HENT:     Head: Normocephalic.     Mouth/Throat:     Mouth: Mucous membranes are moist.     Pharynx: Oropharynx is clear. No oropharyngeal exudate or posterior oropharyngeal erythema.  Eyes:     General: No scleral icterus.       Right eye: No discharge.        Left eye: No discharge.     Extraocular Movements: Extraocular movements intact.     Conjunctiva/sclera: Conjunctivae normal.     Pupils: Pupils are equal, round, and reactive to light.  Cardiovascular:     Rate and Rhythm: Normal rate and regular rhythm.     Pulses: Normal pulses.     Heart sounds: Normal heart sounds. No murmur heard.  No friction rub. No gallop.   Pulmonary:     Effort:  Pulmonary effort is normal. No respiratory distress.     Breath sounds: Normal breath sounds. No wheezing, rhonchi or rales.     Comments: On room air  Chest:     Chest wall: No tenderness.  Abdominal:     General: Bowel sounds are normal. There is no distension.     Palpations: Abdomen is soft. There is no mass.     Tenderness: There is no abdominal tenderness. There is no right CVA tenderness, left CVA tenderness, guarding or rebound.  Genitourinary:    Comments: Incontinent  Musculoskeletal:        General: No swelling or tenderness.     Cervical back: Normal range of motion. No rigidity or tenderness.     Right lower leg: No edema.     Left lower leg: No edema.     Comments: Moves x 4 extremities in bed requires assistance with transfer   Lymphadenopathy:     Cervical: No cervical adenopathy.  Skin:    General: Skin is warm and dry.     Coloration: Skin is not pale.     Findings: No bruising, erythema or rash.  Neurological:     Mental Status: She is alert. Mental status is at baseline.     Cranial Nerves: No cranial nerve deficit.     Sensory: No sensory deficit.     Coordination: Coordination normal.     Gait:  Gait abnormal.  Psychiatric:        Mood and Affect: Mood normal.        Speech: Speech normal.        Behavior: Behavior normal.        Thought Content: Thought content normal.        Judgment: Judgment normal.     Labs reviewed: Recent Labs    02/15/20 0121 02/16/20 0431 04/18/20 0239 04/18/20 0239 04/19/20 0801 04/19/20 0801 04/20/20 0807 04/21/20 0727 05/18/20 1456 05/25/20 1501 06/01/20 1025  NA 138   < > 137   < > 137   < > 138   < > 137 136 135  K 3.5   < > 4.8   < > 3.6   < > 3.4*   < > 3.5 3.3* 3.4*  CL 102   < > 106   < > 104   < > 103   < > 102 103 102  CO2 23   < > 24   < > 22   < > 23   < > 31 29 25   GLUCOSE 99   < > 89   < > 88   < > 90   < > 96 125* 77  BUN 12   < > 10   < > 11   < > 10   < > 10 10 6*  CREATININE 0.82   < > 0.85   < > 0.77   < > 0.80   < > 0.56 0.63 0.58  CALCIUM 9.0   < > 8.6*   < > 8.6*   < > 8.7*   < > 9.4 9.5 9.8  MG 1.9   < > 2.2  --  1.8  --  1.8  --   --   --   --   PHOS 3.0  --   --   --   --   --   --   --   --   --   --    < > =  values in this interval not displayed.   Recent Labs    05/18/20 1456 05/25/20 1501 06/01/20 1025  AST 28 36 29  ALT 33 54* 38  ALKPHOS 86 101 95  BILITOT 0.6 0.6 0.6  PROT 6.2* 6.2* 6.8  ALBUMIN 2.6* 2.7* 3.0*   Recent Labs    05/18/20 1456 05/25/20 1501 06/01/20 1025  WBC 3.8* 2.7* 3.3*  NEUTROABS 2.0 1.1* 1.0*  HGB 13.0 12.6 13.9  HCT 39.7 37.3 40.6  MCV 94.7 92.8 92.9  PLT 106* 53* 212   Lab Results  Component Value Date   TSH 2.458 02/15/2020   Lab Results  Component Value Date   HGBA1C 5.7 (H) 02/15/2020    Significant Diagnostic Results in last 30 days:  CT Head Wo Contrast  Result Date: 05/11/2020 CLINICAL DATA:  Somnolent, altered level of consciousness, right upper lobe non-small cell lung cancer EXAM: CT HEAD WITHOUT CONTRAST TECHNIQUE: Contiguous axial images were obtained from the base of the skull through the vertex without intravenous contrast. COMPARISON:   02/07/2020, 02/05/2020 FINDINGS: Brain: Chronic small vessel ischemic changes are seen throughout the periventricular and subcortical white matter, stable. 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 No signs of acute infarct or hemorrhage. Lateral ventricles and midline structures are otherwise unremarkable. No acute extra-axial fluid collections. No mass effect. Vascular: No hyperdense vessel or unexpected calcification. Skull: Normal. Negative for fracture or focal lesion. Sinuses/Orbits: No acute finding. Other: None. IMPRESSION: 1. Stable chronic small vessel ischemic changes. No acute intracranial process. Electronically Signed   By: Randa Ngo M.D.   On: 05/11/2020 18:44    Assessment/Plan 1. Permanent atrial fibrillation (HCC) HR controlled. Recent INR at goal. Continue on warfarin 5 mg tablet on Monday and Friday and Warfarin 2.5 mg tablet on Tue,Wed,Thur,Sat and Sunday. - continue on metoprolol 12.5 mg tablet at bedtime. Next INR due 06/09/2020    2. Rheumatoid arthritis, involving unspecified site, unspecified whether rheumatoid factor present ([place]) Pain well controlled. - continue on Voltaren gel four times daily.  3. Essential hypertension B/p well controlled.No signs of hypotension. - continue on metoprolol 12.5 mg tablet at bedtime.   4. Mixed hyperlipidemia No LDL on chart for review. - continue on Simvastatin 20 mg tablet daily at bedtime. - continue on heart healthy diet. - will check fasting Lipid panel with her next weekly labs.   5. Vitamin B12 deficiency Latest vit B12 level 879 ( 04/16/2020) much improvement from previous level 173 ( 02/15/2020).Latest Hgb with indices are within normal range. - continue on vitamin B12 1000 mcg tablet daily.   6. Need for Tdap vaccination Facility Nurse to administer Tdap Vaccine 0.5 ml via I.m x 1 dose and repeat in 10 yrs.   7. Need for Hepatitis C screening  Asymptomatic.    Hepatitis C screening with next labs.  Family/ staff Communication: Reviewed plan of care with patient and facility Nurse   Labs/tests ordered:   Lipid panel and Hepatitis C with her next weekly labs.

## 2020-06-03 NOTE — Telephone Encounter (Signed)
I spoke with Hildred Alamin and she advised the opts daughter wanted her to make a cardio appt for the pt.. I advised her th pt has ana ppt with Coletta Memos Np 07/08/20 and she says that is an appropriate appt she will talk with her PCP about some sleepiness during the day the pt has been having but does not feel she is having any cardiac issues the pt may be experiencing that would need to make her a sooner appt but will call back if anything changes.

## 2020-06-03 NOTE — Telephone Encounter (Signed)
Hayley from [facility] returning call.

## 2020-06-04 ENCOUNTER — Encounter: Payer: Self-pay | Admitting: Internal Medicine

## 2020-06-04 NOTE — Progress Notes (Signed)
Pt's step daughter called back stating that pt has an 80/20 plan so they would like to apply for copay assistance and gave me consent to apply in pt's behalf so I completed the online application w/ the [facility] and she was approved for $8,000 for Carboplatin and Etoposide from 05/05/20 through 05/04/21.  Pt is also overqualified for the [employer].

## 2020-06-05 ENCOUNTER — Inpatient Hospital Stay: Payer: Medicare HMO

## 2020-06-05 ENCOUNTER — Other Ambulatory Visit: Payer: Self-pay

## 2020-06-05 VITALS — BP 135/76 | HR 65 | Temp 97.8°F | Resp 18

## 2020-06-05 DIAGNOSIS — C3411 Malignant neoplasm of upper lobe, right bronchus or lung: Secondary | ICD-10-CM

## 2020-06-05 DIAGNOSIS — Z5111 Encounter for antineoplastic chemotherapy: Secondary | ICD-10-CM | POA: Diagnosis not present

## 2020-06-05 MED ORDER — PEGFILGRASTIM-JMDB 6 MG/0.6ML ~~LOC~~ SOSY
PREFILLED_SYRINGE | SUBCUTANEOUS | Status: AC
  Filled 2020-06-05: qty 0.6

## 2020-06-05 MED ORDER — PEGFILGRASTIM-JMDB 6 MG/0.6ML ~~LOC~~ SOSY
6.0000 mg | PREFILLED_SYRINGE | Freq: Once | SUBCUTANEOUS | Status: AC
  Administered 2020-06-05: 6 mg via SUBCUTANEOUS

## 2020-06-05 NOTE — Patient Instructions (Addendum)

## 2020-06-08 ENCOUNTER — Inpatient Hospital Stay: Payer: Medicare HMO

## 2020-06-08 ENCOUNTER — Telehealth: Payer: Self-pay | Admitting: Medical Oncology

## 2020-06-08 ENCOUNTER — Other Ambulatory Visit: Payer: Self-pay | Admitting: Medical Oncology

## 2020-06-08 ENCOUNTER — Other Ambulatory Visit: Payer: Self-pay

## 2020-06-08 ENCOUNTER — Telehealth: Payer: Self-pay

## 2020-06-08 DIAGNOSIS — Z5111 Encounter for antineoplastic chemotherapy: Secondary | ICD-10-CM | POA: Diagnosis not present

## 2020-06-08 DIAGNOSIS — C3411 Malignant neoplasm of upper lobe, right bronchus or lung: Secondary | ICD-10-CM

## 2020-06-08 DIAGNOSIS — E876 Hypokalemia: Secondary | ICD-10-CM

## 2020-06-08 LAB — CBC WITH DIFFERENTIAL (CANCER CENTER ONLY)
Abs Immature Granulocytes: 0 10*3/uL (ref 0.00–0.07)
Basophils Absolute: 0 10*3/uL (ref 0.0–0.1)
Basophils Relative: 0 %
Eosinophils Absolute: 0 10*3/uL (ref 0.0–0.5)
Eosinophils Relative: 0 %
HCT: 34.5 % — ABNORMAL LOW (ref 36.0–46.0)
Hemoglobin: 11.7 g/dL — ABNORMAL LOW (ref 12.0–15.0)
Lymphocytes Relative: 67 %
Lymphs Abs: 0.7 10*3/uL (ref 0.7–4.0)
MCH: 31 pg (ref 26.0–34.0)
MCHC: 33.9 g/dL (ref 30.0–36.0)
MCV: 91.3 fL (ref 80.0–100.0)
Monocytes Absolute: 0 10*3/uL — ABNORMAL LOW (ref 0.1–1.0)
Monocytes Relative: 1 %
Neutro Abs: 0.4 10*3/uL — CL (ref 1.7–7.7)
Neutrophils Relative %: 32 %
Platelet Count: 114 10*3/uL — ABNORMAL LOW (ref 150–400)
RBC: 3.78 MIL/uL — ABNORMAL LOW (ref 3.87–5.11)
RDW: 14.6 % (ref 11.5–15.5)
WBC Count: 1.1 10*3/uL — ABNORMAL LOW (ref 4.0–10.5)
nRBC: 0 % (ref 0.0–0.2)

## 2020-06-08 LAB — CMP (CANCER CENTER ONLY)
ALT: 14 U/L (ref 0–44)
AST: 13 U/L — ABNORMAL LOW (ref 15–41)
Albumin: 2.8 g/dL — ABNORMAL LOW (ref 3.5–5.0)
Alkaline Phosphatase: 68 U/L (ref 38–126)
Anion gap: 6 (ref 5–15)
BUN: 10 mg/dL (ref 8–23)
CO2: 31 mmol/L (ref 22–32)
Calcium: 9.1 mg/dL (ref 8.9–10.3)
Chloride: 101 mmol/L (ref 98–111)
Creatinine: 0.54 mg/dL (ref 0.44–1.00)
GFR, Estimated: >60 mL/min (ref >60)
Glucose, Bld: 98 mg/dL (ref 70–99)
Potassium: 3 mmol/L — CL (ref 3.5–5.1)
Sodium: 138 mmol/L (ref 135–145)
Total Bilirubin: 1.1 mg/dL (ref 0.3–1.2)
Total Protein: 5.8 g/dL — ABNORMAL LOW (ref 6.5–8.1)

## 2020-06-08 MED ORDER — POTASSIUM CHLORIDE CRYS ER 20 MEQ PO TBCR: 20.0000 meq | EXTENDED_RELEASE_TABLET | Freq: Once | ORAL | 0 refills | Status: DC

## 2020-06-08 NOTE — Telephone Encounter (Signed)
Labs-  Communication issue about labs. Pt is getting weekly labs at [facility] on weeks she does not get treatment.

## 2020-06-08 NOTE — Telephone Encounter (Signed)
Spoke to nurse and told her pt is neutropenic and at risk for infection and to call for any signs/sympotms of infection or temp >/= 100.50f KDUR sent to [facility] notified.

## 2020-06-08 NOTE — Telephone Encounter (Signed)
CRITICAL VALUE STICKER  CRITICAL VALUE: Potassium 3.0  RECEIVER (on-site recipient of call): Wendall Mola, RN-Carrying the lab phone  DATE & TIME NOTIFIED: 06/08/20 @ 1456  MESSENGER (representative from lab): H. Flynt  MD NOTIFIED: Abelina Bachelor, RN for Dr. Julien Nordmann  TIME OF NOTIFICATION: 1500  RESPONSE: Acknowledged. Abelina Bachelor, RN to make Dr. Julien Nordmann aware.

## 2020-06-12 ENCOUNTER — Telehealth: Payer: Self-pay

## 2020-06-12 NOTE — Telephone Encounter (Signed)
Ms. Nyra Jabs called to confirm if pt received the flu vaccine at her last visit. I advised her pt did not receive the vaccine here and it is ok for her to receive it. Ms. Nyra Jabs expressed understanding of this information.

## 2020-06-15 ENCOUNTER — Inpatient Hospital Stay: Payer: Medicare HMO

## 2020-06-16 ENCOUNTER — Telehealth: Payer: Self-pay | Admitting: Internal Medicine

## 2020-06-16 LAB — BASIC METABOLIC PANEL
BUN: 7 (ref 4–21)
CO2: 25 — AB (ref 13–22)
Chloride: 98 — AB (ref 99–108)
Creatinine: 0.4 — AB (ref 0.5–1.1)
Glucose: 110
Potassium: 2.3 — AB (ref 3.4–5.3)
Sodium: 138 (ref 137–147)

## 2020-06-16 LAB — COMPREHENSIVE METABOLIC PANEL
Calcium: 8.7 (ref 8.7–10.7)
GFR calc Af Amer: >90
GFR calc non Af Amer: >90

## 2020-06-16 LAB — CBC AND DIFFERENTIAL
HCT: 35 — AB (ref 36–46)
Hemoglobin: 11.6 — AB (ref 12.0–16.0)
Platelets: 54 — AB (ref 150–399)
WBC: 13.2

## 2020-06-16 LAB — CBC: RBC: 3.79 — AB (ref 3.87–5.11)

## 2020-06-16 NOTE — Telephone Encounter (Signed)
Pt with stable vitals and no symptoms at present, but has had leukopenia and now suddenly with leukocytosis to 13.  UA c+s order given and requested that labs also be sent to oncology in case there's another explanation for the leukocytosis related to her chemo.  I don't see that she's receiving steroids anymore.  She's got no symptoms.  INR today also was supratherapeutic at 6, then rechecked at 5.4 so ordered for lab draw in am of INR.

## 2020-06-17 ENCOUNTER — Telehealth: Payer: Self-pay | Admitting: Family

## 2020-06-17 ENCOUNTER — Non-Acute Institutional Stay (SKILLED_NURSING_FACILITY): Payer: Medicare HMO | Admitting: Family

## 2020-06-17 ENCOUNTER — Encounter: Payer: Self-pay | Admitting: Family

## 2020-06-17 DIAGNOSIS — E778 Other disorders of glycoprotein metabolism: Secondary | ICD-10-CM

## 2020-06-17 DIAGNOSIS — E876 Hypokalemia: Secondary | ICD-10-CM

## 2020-06-17 DIAGNOSIS — D696 Thrombocytopenia, unspecified: Secondary | ICD-10-CM | POA: Diagnosis not present

## 2020-06-17 DIAGNOSIS — D72829 Elevated white blood cell count, unspecified: Secondary | ICD-10-CM | POA: Diagnosis not present

## 2020-06-17 NOTE — Telephone Encounter (Signed)
Thank you for the update.I've seen patient today.

## 2020-06-17 NOTE — Progress Notes (Signed)
Location:    [place].   Nursing Home Room Number: 211-D Place of Service:  SNF (31) Provider:  Marlowe Sax, NP    Patient Care Team: [name], MD as PCP - General (Family Medicine) Lorretta Harp, MD as PCP - Cardiology (Cardiology) Gavin Pound, MD as Consulting Physician (Rheumatology) Valrie Hart, RN as Oncology Nurse Navigator  Extended Emergency Contact Information Primary Emergency Contact: [name] Mobile Phone: [phone] Relation: Daughter Secondary Emergency Contact: [name] Mobile Phone: [phone] Relation: Daughter Preferred language: English Interpreter needed? No  Code Status:  Full Code  Goals of care: Advanced Directive information Advanced Directives 06/17/2020  Does Patient Have a Medical Advance Directive? No  Type of Advance Directive -  Does patient want to make changes to medical advance directive? No - Guardian declined  Copy of [place] in Chart? -  Would patient like information on creating a medical advance directive? -     Chief Complaint  Patient presents with  . Acute Visit    Abnormal Labs    HPI:  Pt is a 79 y.o. female seen today for an acute visit for evaluation of abnormal lab results.she is seen in her room awake in bed.she states has had diarrhea for the past 2-3 days.she denies any abdominal pain,cramping,nausea,vomitting,fever or chills.States appetite is fair not good. 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 Her recent WBC 13.2,Hgb 11.6,PLTs 54,K+ 2.3,TP 5.0,Alb 3.0 ( 06/16/2020).Facility Nurse states weekly labs faxed to Oncologist.Previous WBC were  Low 1.1,Hgb 11.7,PLTs 114 ,K+ 3.0 was replete.   Past Medical History:  Diagnosis Date  . A-fib ([place])   . Dysrhythmia   . GERD (gastroesophageal reflux disease)   . Hemorrhoids   . Hyperlipidemia   . Hypertension   . Lung cancer ([place])  2000   RLL  . Obesity   . Persistent atrial fibrillation ([place])   . Rheumatoid arthritis [facility])    Past Surgical History:  Procedure Laterality Date  . BREAST EXCISIONAL BIOPSY Left    x 2  . BREAST EXCISIONAL BIOPSY Right   . BRONCHIAL BIOPSY  02/18/2020   Procedure: BRONCHIAL BIOPSIES;  Surgeon: Collene Gobble, MD;  Location: [facility] ENDOSCOPY;  Service: Cardiopulmonary;;  . BRONCHIAL BRUSHINGS  02/18/2020   Procedure: BRONCHIAL BRUSHINGS;  Surgeon: Collene Gobble, MD;  Location: [place];  Service: Cardiopulmonary;;  . BRONCHIAL NEEDLE ASPIRATION BIOPSY  02/18/2020   Procedure: BRONCHIAL NEEDLE ASPIRATION BIOPSIES;  Surgeon: Collene Gobble, MD;  Location: [place];  Service: Cardiopulmonary;;  . BRONCHIAL WASHINGS  02/18/2020   Procedure: BRONCHIAL WASHINGS;  Surgeon: Collene Gobble, MD;  Location: [place];  Service: Cardiopulmonary;;  . CARDIOVERSION N/A 01/09/2018   Procedure: CARDIOVERSION;  Surgeon: Josue Hector, MD;  Location: [facility] ENDOSCOPY;  Service: Cardiovascular;  Laterality: N/A;  . CARDIOVERSION N/A 03/01/2018   Procedure: CARDIOVERSION;  Surgeon: Sueanne Margarita, MD;  Location: [facility] ENDOSCOPY;  Service: Cardiovascular;  Laterality: N/A;  . ELECTROMAGNETIC NAVIGATION BROCHOSCOPY N/A 02/18/2020   Procedure: ELECTROMAGNETIC NAVIGATION BRONCHOSCOPY;  Surgeon: Collene Gobble, MD;  Location: [place];  Service: Cardiopulmonary;  Laterality: N/A;  . KNEE SURGERY    . LUNG LOBECTOMY Right 2000   RLL removed  . VIDEO BRONCHOSCOPY N/A 02/18/2020   Procedure: VIDEO BRONCHOSCOPY WITH FLUORO;  Surgeon: Collene Gobble, MD;  Location: [facility] ENDOSCOPY;  Service: Cardiopulmonary;  Laterality: N/A;  . VIDEO BRONCHOSCOPY WITH  ENDOBRONCHIAL ULTRASOUND N/A 04/22/2020   Procedure: VIDEO BRONCHOSCOPY WITH ENDOBRONCHIAL ULTRASOUND;  Surgeon: [name], MD;  Location: [facility] OR;  Service: Thoracic;  Laterality: N/A;    Allergies  Allergen Reactions  . Morphine Anaphylaxis  .  Morphine And Related Other (See Comments)    "vitals go down", pass out  . Sulfa Antibiotics Rash    Allergies as of 06/17/2020      Reactions   Morphine Anaphylaxis   Morphine And Related Other (See Comments)   "vitals go down", pass out   Sulfa Antibiotics Rash      Medication List       Accurate as of June 17, 2020  2:25 PM. If you have any questions, ask your nurse or doctor.        STOP taking these medications   warfarin 2.5 MG tablet Commonly known as: COUMADIN Stopped by: Sandrea Hughs, NP   warfarin 5 MG tablet Commonly known as: COUMADIN Stopped by: Sandrea Hughs, NP     TAKE these medications   bisacodyl 10 MG suppository Commonly known as: DULCOLAX Place 10 mg rectally See admin instructions. If not relieved by MOM give 10mg  Bisacodyl supp. Rectally for x1 dose in 24 hours prn constipation   BL MILK OF MAGNESIA PO Take 30 mLs by mouth See admin instructions. If no BM in 3 day, give 30 cc Milk of magnesium p.o. x 1 dose in 24 hours as needed   cyanocobalamin 1000 MCG tablet Take 1 tablet (1,000 mcg total) by mouth daily.   diclofenac Sodium 1 % Gel Commonly known as: Voltaren Apply 4 g topically 4 (four) times daily. To bilateral knees and ankles   gabapentin 100 MG capsule Commonly known as: NEURONTIN Take 100 mg by mouth at bedtime.   levocetirizine 5 MG tablet Commonly known as: XYZAL Take 5 mg by mouth daily.   metoprolol succinate 25 MG 24 hr tablet Commonly known as: TOPROL-XL Take 12.5 mg by mouth at bedtime.   MYRBETRIQ PO Take 25 mg by mouth daily.   potassium chloride SA 20 MEQ tablet Commonly known as: KLOR-CON Take 1 tablet (20 mEq total) by mouth once for 1 dose.   prochlorperazine 10 MG tablet Commonly known as: COMPAZINE Take 10 mg by mouth every 6 (six) hours as needed for nausea or vomiting.   RA SALINE ENEMA RE Place 1 Container rectally See admin instructions. If not relieved by bisacodyl supp. Give disposable  saline enema rectally x1 dose 24 hours prn constipation   simvastatin 20 MG tablet Commonly known as: ZOCOR Take 20 mg by mouth at bedtime.   timolol 0.5 % ophthalmic solution Commonly known as: TIMOPTIC Place 1 drop into both eyes at bedtime.       Review of Systems  Constitutional: Negative for appetite change, chills, fatigue and fever.  HENT: Negative for congestion, postnasal drip, rhinorrhea, sinus pressure, sinus pain, sneezing and sore throat.   Eyes: Negative for pain, discharge, redness and itching.  Respiratory: Negative for cough, chest tightness, shortness of breath and wheezing.   Cardiovascular: Negative for chest pain, palpitations and leg swelling.  Gastrointestinal: Negative for abdominal distention, abdominal pain, constipation, diarrhea, nausea and vomiting.  Genitourinary: Negative for difficulty urinating, flank pain and urgency.       Burning sensation with urination   Musculoskeletal: Positive for gait problem. Negative for back pain, joint swelling and myalgias.  Skin: Negative for color change, pallor and rash.  Neurological: Negative for dizziness, weakness,  light-headedness, numbness and headaches.  Psychiatric/Behavioral: Negative for agitation, confusion and sleep disturbance. The patient is not nervous/anxious.     Immunization History  Administered Date(s) Administered  . Influenza, High Dose Seasonal PF 06/27/2016, 05/23/2017, 06/08/2018  . Moderna SARS-COVID-2 Vaccination 11/12/2019, 12/10/2019  . Pneumococcal Conjugate-13 01/04/2017  . Pneumococcal Polysaccharide-23 01/17/2018   Pertinent  Health Maintenance Due  Topic Date Due  . INFLUENZA VACCINE  08/21/2020 (Originally 03/22/2020)  . DEXA SCAN  Completed  . PNA vac Low Risk Adult  Completed   No flowsheet data found. Functional Status Survey:    Vitals:   06/17/20 1257  BP: 120/64  Pulse: 62  Resp: 18  Temp: (!) 96.8 F (36 C)  SpO2: 96%  Weight: 224 lb 9.6 oz (101.9 kg)   Height: 5\' 1"  (1.549 m)   Body mass index is 42.44 kg/m. Physical Exam Vitals and nursing note reviewed.  Constitutional:      General: She is not in acute distress.    Appearance: She is morbidly obese. She is not ill-appearing.  HENT:     Head: Normocephalic.     Nose: Nose normal. No congestion or rhinorrhea.     Mouth/Throat:     Mouth: Mucous membranes are moist.     Pharynx: Oropharynx is clear. No oropharyngeal exudate or posterior oropharyngeal erythema.  Eyes:     General: No scleral icterus.       Right eye: No discharge.        Left eye: No discharge.     Conjunctiva/sclera: Conjunctivae normal.     Pupils: Pupils are equal, round, and reactive to light.  Cardiovascular:     Rate and Rhythm: Normal rate and regular rhythm.     Pulses: Normal pulses.     Heart sounds: Normal heart sounds. No murmur heard.  No friction rub. No gallop.   Pulmonary:     Effort: Pulmonary effort is normal. No respiratory distress.     Breath sounds: Normal breath sounds. No wheezing, rhonchi or rales.  Chest:     Chest wall: No tenderness.  Abdominal:     General: Bowel sounds are normal. There is no distension.     Palpations: Abdomen is soft. There is no mass.     Tenderness: There is no abdominal tenderness. There is no right CVA tenderness, left CVA tenderness, guarding or rebound.  Musculoskeletal:        General: No swelling or tenderness.     Cervical back: Normal range of motion. No rigidity or tenderness.     Right lower leg: No edema.     Left lower leg: No edema.     Comments: Requires assistance with transfer with Harrel Lemon lift   Lymphadenopathy:     Cervical: No cervical adenopathy.  Skin:    General: Skin is warm and dry.     Coloration: Skin is not pale.     Findings: No bruising, erythema, lesion or rash.  Neurological:     Mental Status: She is alert and oriented to person, place, and time.     Cranial Nerves: No cranial nerve deficit.     Sensory: No sensory  deficit.     Motor: No weakness.     Coordination: Coordination normal.     Gait: Gait abnormal.  Psychiatric:        Mood and Affect: Mood normal.        Behavior: Behavior normal.        Thought Content: Thought content  normal.        Judgment: Judgment normal.    Labs reviewed: Recent Labs    02/15/20 0121 02/16/20 0431 04/18/20 0239 04/18/20 0239 04/19/20 0801 04/19/20 0801 04/20/20 0807 04/21/20 0727 05/25/20 1501 05/25/20 1501 06/01/20 1025 06/08/20 1410 06/16/20 0000  NA 138   < > 137   < > 137   < > 138   < > 136   < > 135 138 138  K 3.5   < > 4.8   < > 3.6   < > 3.4*   < > 3.3*   < > 3.4* 3.0* 2.3*  CL 102   < > 106   < > 104   < > 103   < > 103   < > 102 101 98*  CO2 23   < > 24   < > 22   < > 23   < > 29   < > 25 31 25*  GLUCOSE 99   < > 89   < > 88   < > 90   < > 125*  --  77 98  --   BUN 12   < > 10   < > 11   < > 10   < > 10   < > 6* 10 7  CREATININE 0.82   < > 0.85   < > 0.77   < > 0.80   < > 0.63   < > 0.58 0.54 0.4*  CALCIUM 9.0   < > 8.6*   < > 8.6*   < > 8.7*   < > 9.5   < > 9.8 9.1 8.7  MG 1.9   < > 2.2  --  1.8  --  1.8  --   --   --   --   --   --   PHOS 3.0  --   --   --   --   --   --   --   --   --   --   --   --    < > = values in this interval not displayed.   Recent Labs    05/25/20 1501 06/01/20 1025 06/08/20 1410  AST 36 29 13*  ALT 54* 38 14  ALKPHOS 101 95 68  BILITOT 0.6 0.6 1.1  PROT 6.2* 6.8 5.8*  ALBUMIN 2.7* 3.0* 2.8*   Recent Labs    05/25/20 1501 05/25/20 1501 06/01/20 1025 06/08/20 1410 06/16/20 0000  WBC 2.7*   < > 3.3* 1.1* 13.2  NEUTROABS 1.1*  --  1.0* 0.4*  --   HGB 12.6   < > 13.9 11.7* 11.6*  HCT 37.3   < > 40.6 34.5* 35*  MCV 92.8  --  92.9 91.3  --   PLT 53*   < > 212 114* 54*   < > = values in this interval not displayed.   Lab Results  Component Value Date   TSH 2.458 02/15/2020   Lab Results  Component Value Date   HGBA1C 5.7 (H) 02/15/2020   Significant Diagnostic Results in last 30 days:   No results found.  Assessment/Plan 1. Leukocytosis, unspecified type WBC 13.2 unclear etiology  - Afebrile - will check lab work to rule out infectious and metabolic etiologies. - Obtain portable Chest X-ray rule out PNA  - Repeat CBC/diff and BMP 06/19/2020  -  urine specimen for U/A and C/S rule out UTI ordered by  Dr.Reed but Nurse was not able to collect due to diarrhea.[place] will be collected today.  - stool specimen to rule out C-diff    2. Hypokalemia K+ 2.3  - Potassium chloride 40 meq tablet one by mouth x 1 dose then repeat tonight at 10 pm ordered by on call provider prior to visit today.  - BMP 06/19/2020   3. Thrombocytopenia ([facility]) Plts 54 previous 114  - No bleeding noted.Lab results faxed to Oncologist. - CBC/diff 06/19/2020   4. Hypoproteinemia (H[facility]) TP 5.0,ALb 3.0 suspect due to poor oral intake and chemotherapy treatment. - continue to follow up with facility Nutritionist.   Family/ staff Communication: Reviewed plan of care with patient and facility Nurse.   Labs/tests ordered:   - CBC/diff and BMP 06/19/2020  - stool specimen to rule out C-diff

## 2020-06-17 NOTE — Telephone Encounter (Signed)
Facility Nurse called with chest X-ray results shows infiltrates.Also has Potassium level 2.9 which has improved from yesterday 2.3 orders given to start on Potassium chloride 40 meq tablet x 1 dose.repeat BMP orders in place in the morning.Azithromycin 250 mg tablet administered 2 tablet x 1 dose then one tablet daily x 4 days then stop.

## 2020-06-19 ENCOUNTER — Ambulatory Visit (HOSPITAL_COMMUNITY)
Admission: RE | Admit: 2020-06-19 | Discharge: 2020-06-19 | Disposition: A | Discharge status: Home / Self Care | Payer: Medicare HMO | Source: Ambulatory Visit | Attending: Physician Assistant | Admitting: Physician Assistant

## 2020-06-19 ENCOUNTER — Encounter: Payer: Self-pay | Admitting: Adult Health

## 2020-06-19 ENCOUNTER — Non-Acute Institutional Stay (SKILLED_NURSING_FACILITY): Payer: Medicare HMO | Admitting: Adult Health

## 2020-06-19 ENCOUNTER — Other Ambulatory Visit: Payer: Self-pay

## 2020-06-19 DIAGNOSIS — Z7901 Long term (current) use of anticoagulants: Secondary | ICD-10-CM | POA: Diagnosis not present

## 2020-06-19 DIAGNOSIS — I4821 Permanent atrial fibrillation: Secondary | ICD-10-CM | POA: Diagnosis not present

## 2020-06-19 DIAGNOSIS — J189 Pneumonia, unspecified organism: Secondary | ICD-10-CM | POA: Diagnosis not present

## 2020-06-19 DIAGNOSIS — C3411 Malignant neoplasm of upper lobe, right bronchus or lung: Secondary | ICD-10-CM | POA: Diagnosis present

## 2020-06-19 IMAGING — CT CT CHEST W/ CM
2 of 3 series · 15 of 36 positions shown, 18 images · IV contrast (omnipaque)
Comparison: [DATE] PET-CT.  CT chest [DATE].

CLINICAL DATA: Primary Cancer Type: Lung
TECHNIQUE: Multidetector CT imaging of the chest was performed during
intravenous contrast administration.

CONTRAST:  75mL OMNIPAQUE IOHEXOL 300 MG/ML  SOLN

[Series 3: chest with 2mm st · axial · 0.61mm/px · z∈[+1152,+1386]mm · 12 of 139 slices shown, 15 images]
[im 11/139  mediastinal]
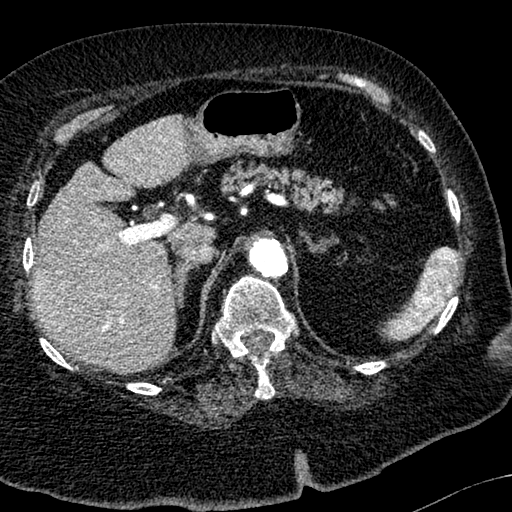
[im 11/139  lung]
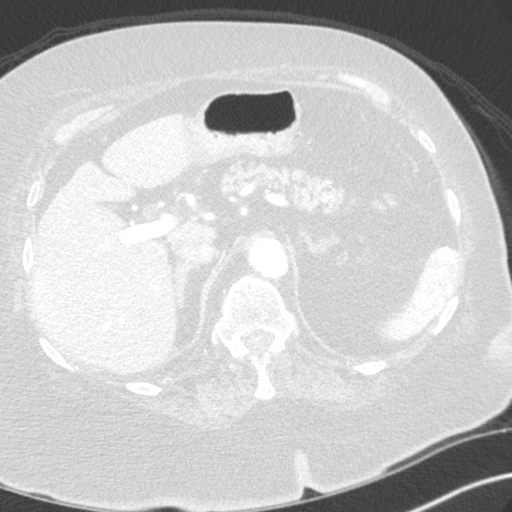
[im 21/139  lung]
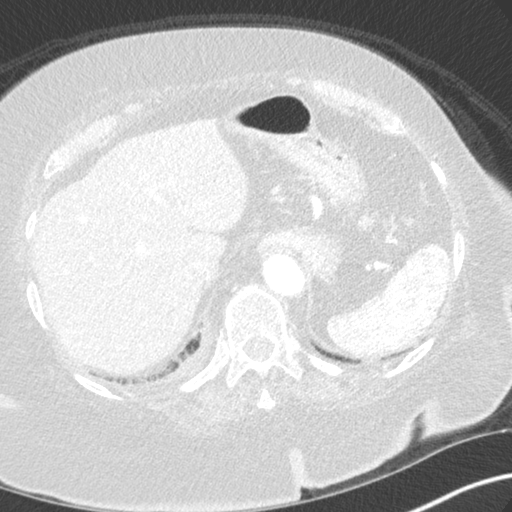
[im 31/139  lung]
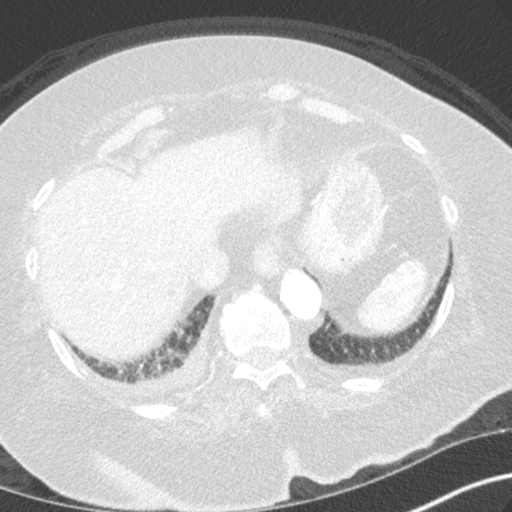
[im 41/139  lung]
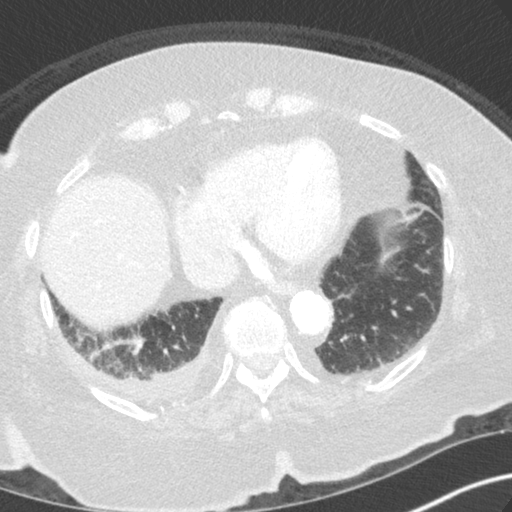
[im 52/139  mediastinal]
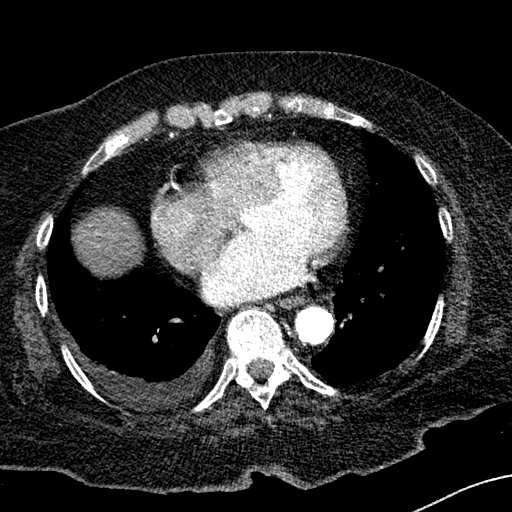
[im 52/139  lung]
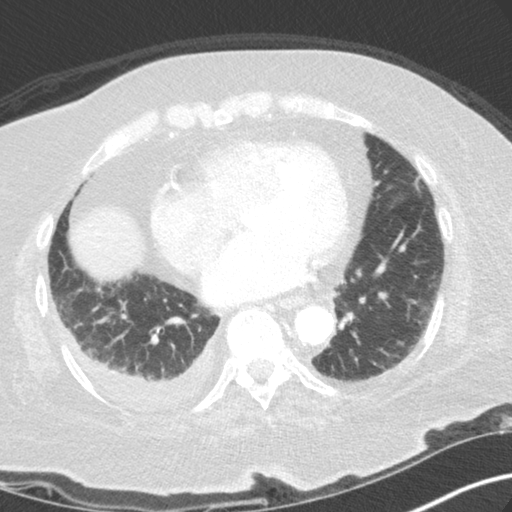
[im 62/139  lung]
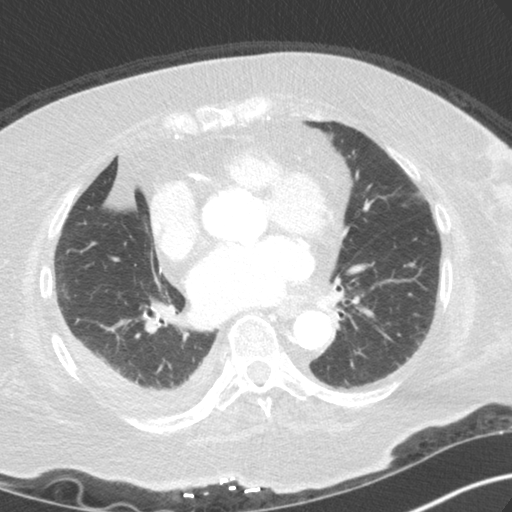
[im 77/139  lung]
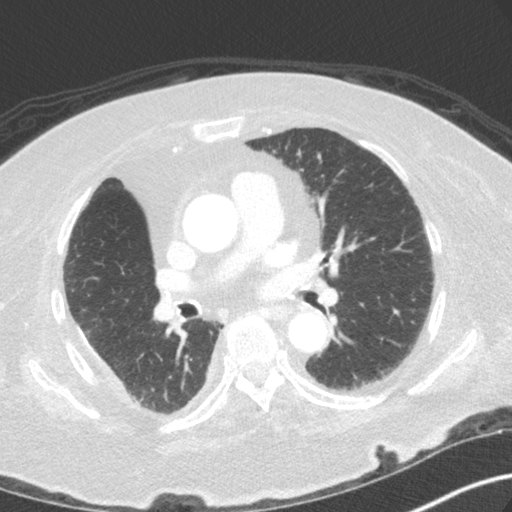
[im 87/139  lung]
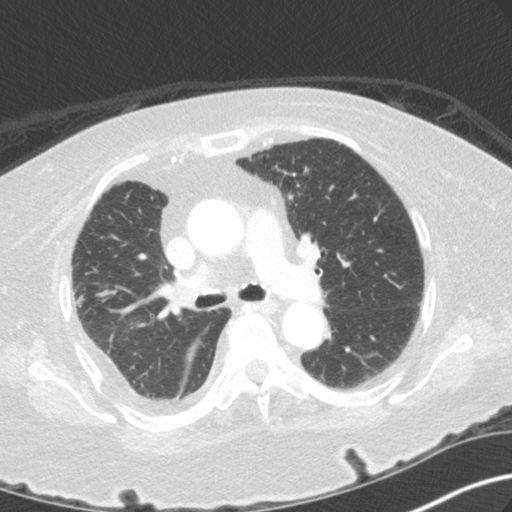
[im 98/139  mediastinal]
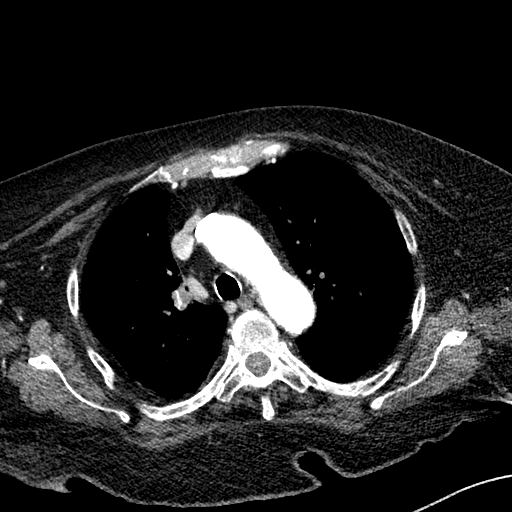
[im 98/139  lung]
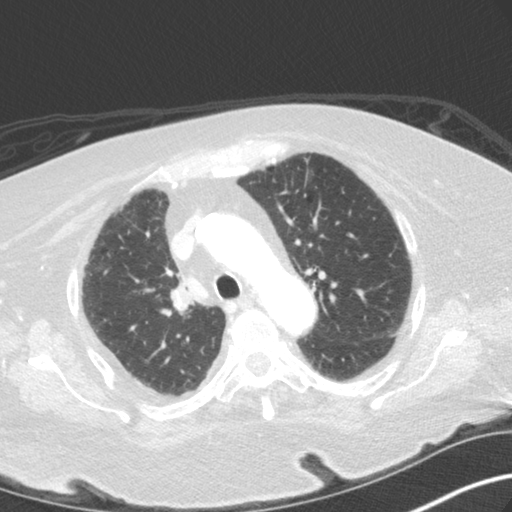
[im 108/139  lung]
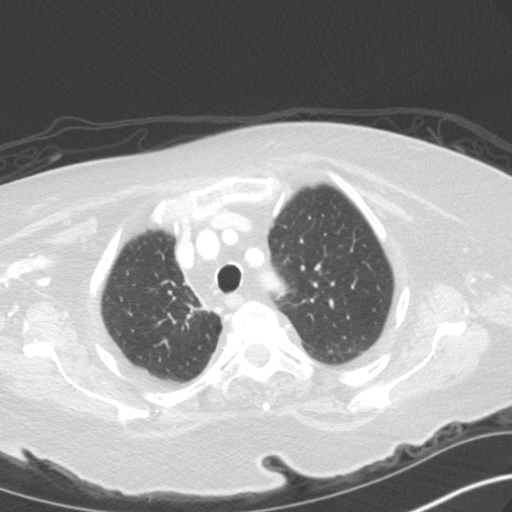
[im 118/139  lung]
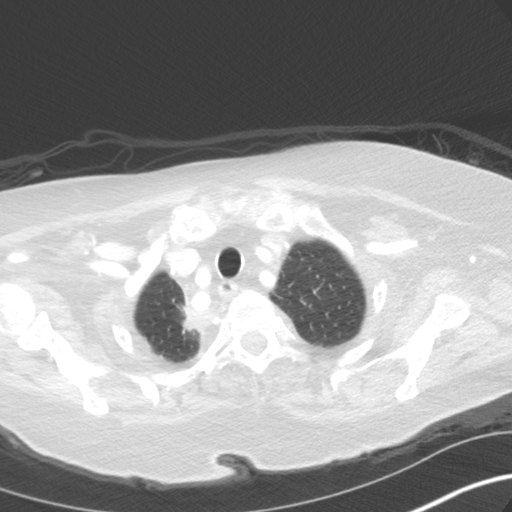
[im 128/139  lung]
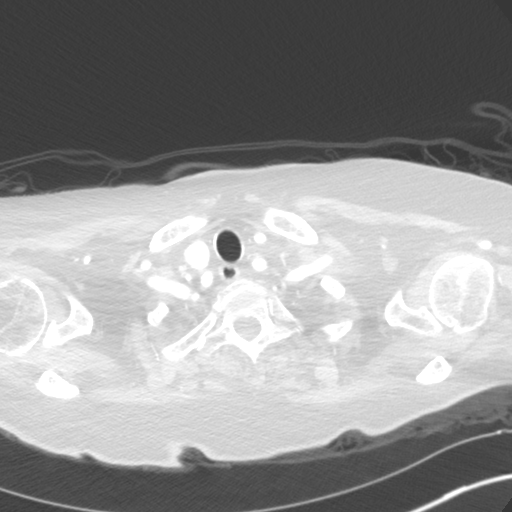

[Series 6: chest with 2mm st cor · coronal · 0.54mm/px · 3 of 151 slices shown]
[im 31/151  lung]
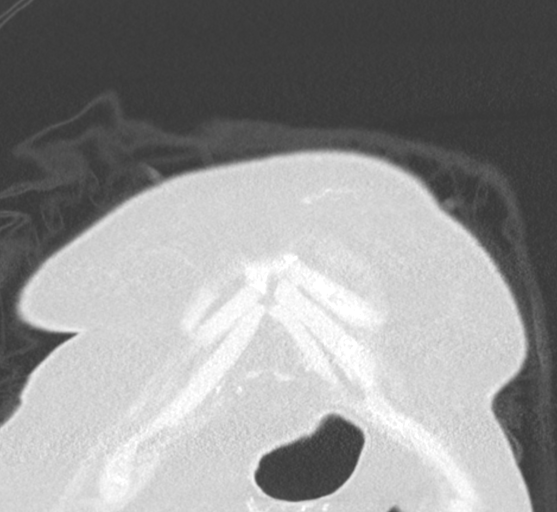
[im 61/151  lung]
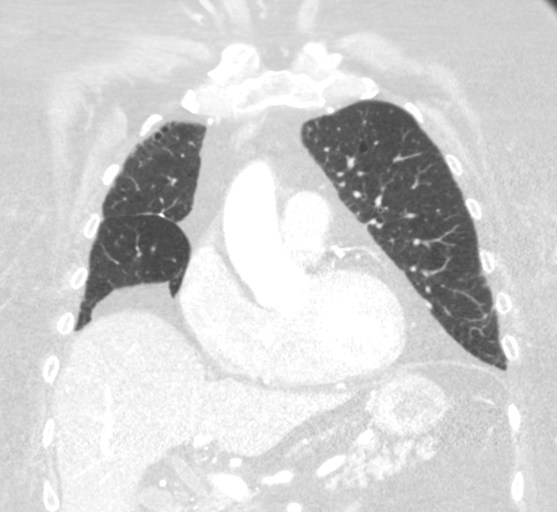
[im 91/151  lung]
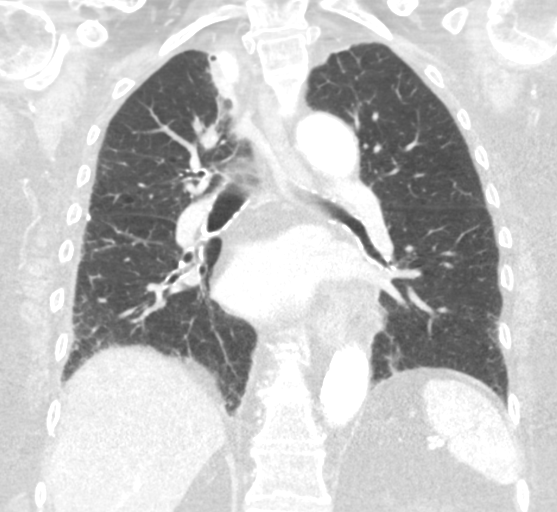

[15 of 36 positions shown; findings below may reference images not displayed]

Imaging Indication: Assess response to therapy

Interval therapy since last imaging? Yes

Initial Cancer Diagnosis

Date: [DATE]; established by: Biopsy-proven

Detailed Pathology: Stage IIIb small cell lung carcinoma.

Primary Tumor location: Right upper lobe.

Surgeries: Right lower lobectomy [SN].

Chemotherapy: Yes; Ongoing? Yes; Most recent administration:
[DATE]

Immunotherapy? No

Radiation therapy? No

EXAM:
CT CHEST WITH CONTRAST
FINDINGS: Cardiovascular: Aortic atherosclerosis. Left and right coronary
artery calcifications and/or stents. Mild cardiomegaly. No
pericardial effusion.

Mediastinum/Nodes: Interval decrease in size of enlarged pretracheal
lymph nodes, largest node measuring 1.4 x 1.2 cm, previously 2.5 x
2.3 cm (series 3, image 47). Thyroid gland, trachea, and esophagus
demonstrate no significant findings.

Lungs/Pleura: Status post right middle lobectomy. Interval decrease
in size of a suprahilar nodule of the right upper lobe, measuring
1.7 x 1.2 cm, previously 2.1 x 1.6 cm (series 4, image 41). Interval
decrease in size of nodular soft tissue involving the pleura at the
right apex, measuring 1.7 x 1.4 cm, previously 2.8 x 2.1 cm (series
3, image 22). Interval decrease in irregular nodularity of the
peripheral right upper lobe, largest component approximately 6 mm,
previously 1.4 cm (series 4, image 52). New small right pleural
effusion.

Upper Abdomen: No acute abnormality.

Musculoskeletal: No chest wall mass or suspicious bone lesions
identified.
IMPRESSION: 1. Interval decrease in size of a suprahilar nodule of the right
upper lobe and nodular soft tissue involving the pleura at the right
apex.
2. Interval decrease in size of enlarged pretracheal lymph nodes.
3. Findings are consistent with treatment response.
4. Interval decrease in irregular nodularity of the peripheral right
upper lobe. Given appearance this more likely reflects resolving
nonspecific infection or inflammation.
5. New small right pleural effusion, nonspecific. No obvious pleural
nodularity or soft tissue.
6. Status post right middle lobectomy.
7. Coronary artery disease.  Aortic Atherosclerosis ([SN]-[SN]).

## 2020-06-19 MED ORDER — IOHEXOL 300 MG/ML  SOLN
75.0000 mL | Freq: Once | INTRAMUSCULAR | Status: AC | PRN
  Administered 2020-06-19: 75 mL via INTRAVENOUS

## 2020-06-20 NOTE — Progress Notes (Signed)
Location:  [place] Room Number: 211-D Place of Service:  SNF (31) Provider:  Durenda Age, DNP, FNP-BC  Patient Care Team: [name], MD as PCP - General (Family Medicine) Lorretta Harp, MD as PCP - Cardiology (Cardiology) Gavin Pound, MD as Consulting Physician (Rheumatology) Valrie Hart, RN as Oncology Nurse Navigator  Extended Emergency Contact Information Primary Emergency Contact: [name] Mobile Phone: [phone] Relation: Daughter Secondary Emergency Contact: [name] Mobile Phone: [phone] Relation: Daughter Preferred language: English Interpreter needed? No  Code Status:  Full Code  Goals of care: Advanced Directive information Advanced Directives 06/17/2020  Does Patient Have a Medical Advance Directive? No  Type of Advance Directive -  Does patient want to make changes to medical advance directive? No - Guardian declined  Copy of [place] in Chart? -  Would patient like information on creating a medical advance directive? -     Chief Complaint  Patient presents with  . Acute Visit    INR management    HPI:  Pt is a 79 y.o. female seen today for Coumadin therapy. She has PMH of RA, persistent atrial fibrillation, hypertension and recently diagnosed with lung cancer. Latest INR 2.7. Coumadin was held on 06/18/20 with INR of 2.8. She is currently taking Azithromycin for possibl;e pneumonia. No bruising nor bleeding noted. She was seen in her room while being hoyered back to bed with husband at bedside.    Past Medical History:  Diagnosis Date  . A-fib ([place])   . Dysrhythmia   . GERD (gastroesophageal reflux disease)   . Hemorrhoids   . Hyperlipidemia   . Hypertension   . Lung cancer ([place]) 2000   RLL  . Obesity   . Persistent atrial fibrillation ([place])   . Rheumatoid arthritis [facility])    Past Surgical History:  Procedure Laterality Date  . BREAST EXCISIONAL  BIOPSY Left    x 2  . BREAST EXCISIONAL BIOPSY Right   . BRONCHIAL BIOPSY  02/18/2020   Procedure: BRONCHIAL BIOPSIES;  Surgeon: Collene Gobble, MD;  Location: [facility] ENDOSCOPY;  Service: Cardiopulmonary;;  . BRONCHIAL BRUSHINGS  02/18/2020   Procedure: BRONCHIAL BRUSHINGS;  Surgeon: Collene Gobble, MD;  Location: [place];  Service: Cardiopulmonary;;  . BRONCHIAL NEEDLE ASPIRATION BIOPSY  02/18/2020   Procedure: BRONCHIAL NEEDLE ASPIRATION BIOPSIES;  Surgeon: Collene Gobble, MD;  Location: [place];  Service: Cardiopulmonary;;  . BRONCHIAL WASHINGS  02/18/2020   Procedure: BRONCHIAL WASHINGS;  Surgeon: Collene Gobble, MD;  Location: [place];  Service: Cardiopulmonary;;  . CARDIOVERSION N/A 01/09/2018   Procedure: CARDIOVERSION;  Surgeon: Josue Hector, MD;  Location: [facility] ENDOSCOPY;  Service: Cardiovascular;  Laterality: N/A;  . CARDIOVERSION N/A 03/01/2018   Procedure: CARDIOVERSION;  Surgeon: Sueanne Margarita, MD;  Location: [facility] ENDOSCOPY;  Service: Cardiovascular;  Laterality: N/A;  . ELECTROMAGNETIC NAVIGATION BROCHOSCOPY N/A 02/18/2020   Procedure: ELECTROMAGNETIC NAVIGATION BRONCHOSCOPY;  Surgeon: Collene Gobble, MD;  Location: [place];  Service: Cardiopulmonary;  Laterality: N/A;  . KNEE SURGERY    . LUNG LOBECTOMY Right 2000   RLL removed  . VIDEO BRONCHOSCOPY N/A 02/18/2020   Procedure: VIDEO BRONCHOSCOPY WITH FLUORO;  Surgeon: Collene Gobble, MD;  Location: [facility] ENDOSCOPY;  Service: Cardiopulmonary;  Laterality: N/A;  . VIDEO BRONCHOSCOPY WITH ENDOBRONCHIAL ULTRASOUND N/A 04/22/2020   Procedure: VIDEO BRONCHOSCOPY WITH ENDOBRONCHIAL ULTRASOUND;  Surgeon: [name], MD;  Location: [place];  Service: Thoracic;  Laterality:  N/A;    Allergies  Allergen Reactions  . Morphine Anaphylaxis  . Morphine And Related Other (See Comments)    "vitals go down", pass out  . Sulfa Antibiotics Rash    Outpatient Encounter Medications as of 06/19/2020  Medication Sig  .  azithromycin (ZITHROMAX) 250 MG tablet Take 250 mg by mouth daily.  . bisacodyl (DULCOLAX) 10 MG suppository Place 10 mg rectally See admin instructions. If not relieved by MOM give 10mg  Bisacodyl supp. Rectally for x1 dose in 24 hours prn constipation   . diclofenac Sodium (VOLTAREN) 1 % GEL Apply 4 g topically 4 (four) times daily. To bilateral knees and ankles  . gabapentin (NEURONTIN) 100 MG capsule Take 100 mg by mouth at bedtime.  Marland Kitchen levocetirizine (XYZAL) 5 MG tablet Take 5 mg by mouth daily.   . Magnesium Hydroxide (BL MILK OF MAGNESIA PO) Take 30 mLs by mouth See admin instructions. If no BM in 3 day, give 30 cc Milk of magnesium p.o. x 1 dose in 24 hours as needed   . metoprolol succinate (TOPROL-XL) 25 MG 24 hr tablet Take 12.5 mg by mouth at bedtime.   . Mirabegron (MYRBETRIQ PO) Take 25 mg by mouth daily.  . OXYGEN Inhale 2 L into the lungs as needed (to maintain O2 saturation of 90%).  Marland Kitchen prochlorperazine (COMPAZINE) 10 MG tablet Take 10 mg by mouth every 6 (six) hours as needed for nausea or vomiting.  . saccharomyces boulardii (FLORASTOR) 250 MG capsule Take 250 mg by mouth 2 (two) times daily.  . simvastatin (ZOCOR) 20 MG tablet Take 20 mg by mouth at bedtime.  . Sodium Phosphates (RA SALINE ENEMA RE) Place 1 Container rectally See admin instructions. If not relieved by bisacodyl supp. Give disposable saline enema rectally x1 dose 24 hours prn constipation   . timolol (TIMOPTIC) 0.5 % ophthalmic solution Place 1 drop into both eyes at bedtime.   . vitamin B-12 1000 MCG tablet Take 1 tablet (1,000 mcg total) by mouth daily.  Marland Kitchen warfarin (COUMADIN) 2.5 MG tablet Take 2.5 mg by mouth daily.  . [DISCONTINUED] potassium chloride SA (KLOR-CON) 20 MEQ tablet Take 1 tablet (20 mEq total) by mouth once for 1 dose.  . [DISCONTINUED] simvastatin (ZOCOR) 20 MG tablet Take 20 mg by mouth at bedtime.    No facility-administered encounter medications on file as of 06/19/2020.    Review of  Systems  GENERAL: No change in appetite, no fatigue, no weight changes, no fever, chills or weakness MOUTH and THROAT: Denies oral discomfort, gingival pain or bleeding RESPIRATORY: no cough, SOB, DOE, wheezing, hemoptysis CARDIAC: No chest pain, edema or palpitations GI: No abdominal pain, diarrhea, constipation, heart burn, nausea or vomiting GU: Denies dysuria, frequency, hematuria, incontinence, or discharge NEUROLOGICAL: Denies dizziness, syncope, numbness, or headache PSYCHIATRIC: Denies feelings of depression or anxiety. No report of hallucinations, insomnia, paranoia, or agitation   Immunization History  Administered Date(s) Administered  . Influenza, High Dose Seasonal PF 06/27/2016, 05/23/2017, 06/08/2018  . Moderna SARS-COVID-2 Vaccination 11/12/2019, 12/10/2019  . Pneumococcal Conjugate-13 01/04/2017  . Pneumococcal Polysaccharide-23 01/17/2018   Pertinent  Health Maintenance Due  Topic Date Due  . INFLUENZA VACCINE  08/21/2020 (Originally 03/22/2020)  . DEXA SCAN  Completed  . PNA vac Low Risk Adult  Completed   No flowsheet data found.   Vitals:   06/19/20 1533  BP: 130/66  Pulse: 71  Resp: 16  Temp: 97.9 F (36.6 C)  TempSrc: Oral  SpO2: 96%  Weight:  223 lb 8 oz (101.4 kg)  Height: 5\' 1"  (1.549 m)   Body mass index is 42.23 kg/m.  Physical Exam  GENERAL APPEARANCE: Well nourished. In no acute distress. Morbidly obese SKIN:  Skin is warm and dry.  MOUTH and THROAT: Lips are without lesions. Oral mucosa is moist and without lesions. Tongue is normal in shape, size, and color and without lesions RESPIRATORY: Breathing is even & unlabored, BS CTAB CARDIAC: Irregular heart rhythm, no murmur,no extra heart sounds GI: Abdomen soft, normal BS, no masses, no tenderness EXTREMITIES:  Able to move X 4 extremities NEUROLOGICAL: There is no tremor. Speech is clear. Alert and oriented X 3. PSYCHIATRIC:  Affect and behavior are appropriate  Labs reviewed: Recent  Labs    02/15/20 0121 02/16/20 0431 04/18/20 0239 04/18/20 0239 04/19/20 0801 04/19/20 0801 04/20/20 0807 04/21/20 0727 05/25/20 1501 05/25/20 1501 06/01/20 1025 06/08/20 1410 06/16/20 0000  NA 138   < > 137   < > 137   < > 138   < > 136   < > 135 138 138  K 3.5   < > 4.8   < > 3.6   < > 3.4*   < > 3.3*   < > 3.4* 3.0* 2.3*  CL 102   < > 106   < > 104   < > 103   < > 103   < > 102 101 98*  CO2 23   < > 24   < > 22   < > 23   < > 29   < > 25 31 25*  GLUCOSE 99   < > 89   < > 88   < > 90   < > 125*  --  77 98  --   BUN 12   < > 10   < > 11   < > 10   < > 10   < > 6* 10 7  CREATININE 0.82   < > 0.85   < > 0.77   < > 0.80   < > 0.63   < > 0.58 0.54 0.4*  CALCIUM 9.0   < > 8.6*   < > 8.6*   < > 8.7*   < > 9.5   < > 9.8 9.1 8.7  MG 1.9   < > 2.2  --  1.8  --  1.8  --   --   --   --   --   --   PHOS 3.0  --   --   --   --   --   --   --   --   --   --   --   --    < > = values in this interval not displayed.   Recent Labs    05/25/20 1501 06/01/20 1025 06/08/20 1410  AST 36 29 13*  ALT 54* 38 14  ALKPHOS 101 95 68  BILITOT 0.6 0.6 1.1  PROT 6.2* 6.8 5.8*  ALBUMIN 2.7* 3.0* 2.8*   Recent Labs    05/25/20 1501 05/25/20 1501 06/01/20 1025 06/08/20 1410 06/16/20 0000  WBC 2.7*   < > 3.3* 1.1* 13.2  NEUTROABS 1.1*  --  1.0* 0.4*  --   HGB 12.6   < > 13.9 11.7* 11.6*  HCT 37.3   < > 40.6 34.5* 35*  MCV 92.8  --  92.9 91.3  --   PLT 53*   < >  212 114* 54*   < > = values in this interval not displayed.   Lab Results  Component Value Date   TSH 2.458 02/15/2020   Lab Results  Component Value Date   HGBA1C 5.7 (H) 02/15/2020    Significant Diagnostic Results in last 30 days:  CT Chest W Contrast  Result Date: 06/19/2020 CLINICAL DATA:  Primary Cancer Type: Lung Imaging Indication: Assess response to therapy Interval therapy since last imaging? Yes Initial Cancer Diagnosis Date: 02/18/2020; established by: Biopsy-proven Detailed Pathology: Stage IIIb small cell lung  carcinoma. Primary Tumor location: Right upper lobe. Surgeries: Right lower lobectomy 2000. Chemotherapy: Yes; Ongoing? Yes; Most recent administration: 06/01/2020 Immunotherapy? No Radiation therapy? No EXAM: CT CHEST WITH CONTRAST TECHNIQUE: Multidetector CT imaging of the chest was performed during intravenous contrast administration. CONTRAST:  37mL OMNIPAQUE IOHEXOL 300 MG/ML  SOLN COMPARISON:  03/10/2020 PET-CT.  CT chest 02/14/2020. FINDINGS: Cardiovascular: Aortic atherosclerosis. Left and right coronary artery calcifications and/or stents. Mild cardiomegaly. No pericardial effusion. Mediastinum/Nodes: Interval decrease in size of enlarged pretracheal lymph nodes, largest node measuring 1.4 x 1.2 cm, previously 2.5 x 2.3 cm (series 3, image 47). Thyroid gland, trachea, and esophagus demonstrate no significant findings. Lungs/Pleura: Status post right middle lobectomy. Interval decrease in size of a suprahilar nodule of the right upper lobe, measuring 1.7 x 1.2 cm, previously 2.1 x 1.6 cm (series 4, image 41). Interval decrease in size of nodular soft tissue involving the pleura at the right apex, measuring 1.7 x 1.4 cm, previously 2.8 x 2.1 cm (series 3, image 22). Interval decrease in irregular nodularity of the peripheral right upper lobe, largest component approximately 6 mm, previously 1.4 cm (series 4, image 52). New small right pleural effusion. Upper Abdomen: No acute abnormality. Musculoskeletal: No chest wall mass or suspicious bone lesions identified. IMPRESSION: 1. Interval decrease in size of a suprahilar nodule of the right upper lobe and nodular soft tissue involving the pleura at the right apex. 2. Interval decrease in size of enlarged pretracheal lymph nodes. 3. Findings are consistent with treatment response. 4. Interval decrease in irregular nodularity of the peripheral right upper lobe. Given appearance this more likely reflects resolving nonspecific infection or inflammation. 5. New small  right pleural effusion, nonspecific. No obvious pleural nodularity or soft tissue. 6. Status post right middle lobectomy. 7. Coronary artery disease.  Aortic Atherosclerosis (ICD10-I70.0). Electronically Signed   By: Eddie Candle M.D.   On: 06/19/2020 11:36    Assessment/Plan  1. Long term (current) use of anticoagulants -  INR 2.7, will change Coumadin dosage to 2.5 mg daily and repeat INR on 06/22/20  2. Permanent atrial fibrillation (HCC) -  Rate-controlled, continue Metoprolol succinate ER for rate control and Coumadin for anticoagulation  3. Pneumonia due to infectious organism, unspecified laterality, unspecified part of lung - no SOB, continue Azithromycin and Probiotics    Family/ staff Communication: Discussed plan of care with resident and charge nurse.  Labs/tests ordered: INR on 06/22/20  Goals of care:   Short-term care   Durenda Age, DNP, MSN, FNP-BC [facility] [phone] (Monday-Friday 8:00 a.m. - 5:00 p.m.) [phone] (after hours)

## 2020-06-22 ENCOUNTER — Encounter: Payer: Self-pay | Admitting: Internal Medicine

## 2020-06-22 ENCOUNTER — Inpatient Hospital Stay: Payer: Medicare HMO

## 2020-06-22 ENCOUNTER — Inpatient Hospital Stay: Payer: Medicare HMO | Attending: Internal Medicine | Admitting: Internal Medicine

## 2020-06-22 ENCOUNTER — Other Ambulatory Visit: Payer: Self-pay

## 2020-06-22 VITALS — BP 141/83 | HR 91 | Temp 97.5°F | Resp 18

## 2020-06-22 VITALS — Wt 211.1 lb

## 2020-06-22 DIAGNOSIS — Z5111 Encounter for antineoplastic chemotherapy: Secondary | ICD-10-CM | POA: Diagnosis not present

## 2020-06-22 DIAGNOSIS — C3411 Malignant neoplasm of upper lobe, right bronchus or lung: Secondary | ICD-10-CM

## 2020-06-22 DIAGNOSIS — Z87891 Personal history of nicotine dependence: Secondary | ICD-10-CM | POA: Diagnosis not present

## 2020-06-22 DIAGNOSIS — E876 Hypokalemia: Secondary | ICD-10-CM | POA: Diagnosis not present

## 2020-06-22 DIAGNOSIS — Z5189 Encounter for other specified aftercare: Secondary | ICD-10-CM | POA: Insufficient documentation

## 2020-06-22 LAB — CMP (CANCER CENTER ONLY)
ALT: 36 U/L (ref 0–44)
AST: 26 U/L (ref 15–41)
Albumin: 2.7 g/dL — ABNORMAL LOW (ref 3.5–5.0)
Alkaline Phosphatase: 84 U/L (ref 38–126)
Anion gap: 7 (ref 5–15)
BUN: 5 mg/dL — ABNORMAL LOW (ref 8–23)
CO2: 29 mmol/L (ref 22–32)
Calcium: 9.1 mg/dL (ref 8.9–10.3)
Chloride: 103 mmol/L (ref 98–111)
Creatinine: 0.59 mg/dL (ref 0.44–1.00)
GFR, Estimated: >60 mL/min (ref >60)
Glucose, Bld: 93 mg/dL (ref 70–99)
Potassium: 2.8 mmol/L — ABNORMAL LOW (ref 3.5–5.1)
Sodium: 139 mmol/L (ref 135–145)
Total Bilirubin: 0.5 mg/dL (ref 0.3–1.2)
Total Protein: 6 g/dL — ABNORMAL LOW (ref 6.5–8.1)

## 2020-06-22 LAB — CBC WITH DIFFERENTIAL (CANCER CENTER ONLY)
Abs Immature Granulocytes: 0.37 10*3/uL — ABNORMAL HIGH (ref 0.00–0.07)
Basophils Absolute: 0.1 10*3/uL (ref 0.0–0.1)
Basophils Relative: 1 %
Eosinophils Absolute: 0 10*3/uL (ref 0.0–0.5)
Eosinophils Relative: 0 %
HCT: 35.9 % — ABNORMAL LOW (ref 36.0–46.0)
Hemoglobin: 11.9 g/dL — ABNORMAL LOW (ref 12.0–15.0)
Immature Granulocytes: 3 %
Lymphocytes Relative: 14 %
Lymphs Abs: 1.9 10*3/uL (ref 0.7–4.0)
MCH: 31.2 pg (ref 26.0–34.0)
MCHC: 33.1 g/dL (ref 30.0–36.0)
MCV: 94 fL (ref 80.0–100.0)
Monocytes Absolute: 2.3 10*3/uL — ABNORMAL HIGH (ref 0.1–1.0)
Monocytes Relative: 17 %
Neutro Abs: 8.5 10*3/uL — ABNORMAL HIGH (ref 1.7–7.7)
Neutrophils Relative %: 65 %
Platelet Count: 344 10*3/uL (ref 150–400)
RBC: 3.82 MIL/uL — ABNORMAL LOW (ref 3.87–5.11)
RDW: 15.5 % (ref 11.5–15.5)
WBC Count: 13.1 10*3/uL — ABNORMAL HIGH (ref 4.0–10.5)
nRBC: 0.2 % (ref 0.0–0.2)

## 2020-06-22 MED ORDER — PALONOSETRON HCL INJECTION 0.25 MG/5ML
0.2500 mg | Freq: Once | INTRAVENOUS | Status: AC
  Administered 2020-06-22: 0.25 mg via INTRAVENOUS

## 2020-06-22 MED ORDER — POTASSIUM CHLORIDE CRYS ER 20 MEQ PO TBCR
EXTENDED_RELEASE_TABLET | ORAL | Status: AC
  Filled 2020-06-22: qty 2

## 2020-06-22 MED ORDER — SODIUM CHLORIDE 0.9 % IV SOLN: 40.0000 meq | Freq: Once | INTRAVENOUS | Status: DC

## 2020-06-22 MED ORDER — SODIUM CHLORIDE 0.9 % IV SOLN
150.0000 mg | Freq: Once | INTRAVENOUS | Status: AC
  Administered 2020-06-22: 150 mg via INTRAVENOUS
  Filled 2020-06-22: qty 150

## 2020-06-22 MED ORDER — POTASSIUM CHLORIDE CRYS ER 20 MEQ PO TBCR: 40.0000 meq | EXTENDED_RELEASE_TABLET | Freq: Two times a day (BID) | ORAL | 0 refills | Status: DC

## 2020-06-22 MED ORDER — SODIUM CHLORIDE 0.9 % IV SOLN
Freq: Once | INTRAVENOUS | Status: AC
  Filled 2020-06-22: qty 250

## 2020-06-22 MED ORDER — PALONOSETRON HCL INJECTION 0.25 MG/5ML
INTRAVENOUS | Status: AC
  Filled 2020-06-22: qty 5

## 2020-06-22 MED ORDER — SODIUM CHLORIDE 0.9 % IV SOLN
10.0000 mg | Freq: Once | INTRAVENOUS | Status: AC
  Administered 2020-06-22: 10 mg via INTRAVENOUS
  Filled 2020-06-22: qty 10

## 2020-06-22 MED ORDER — POTASSIUM CHLORIDE CRYS ER 20 MEQ PO TBCR
40.0000 meq | EXTENDED_RELEASE_TABLET | Freq: Once | ORAL | Status: AC
  Administered 2020-06-22: 40 meq via ORAL

## 2020-06-22 MED ORDER — SODIUM CHLORIDE 0.9 % IV SOLN
410.0000 mg | Freq: Once | INTRAVENOUS | Status: AC
  Administered 2020-06-22: 410 mg via INTRAVENOUS
  Filled 2020-06-22: qty 41

## 2020-06-22 MED ORDER — SODIUM CHLORIDE 0.9 % IV SOLN
80.0000 mg/m2 | Freq: Once | INTRAVENOUS | Status: AC
  Administered 2020-06-22: 170 mg via INTRAVENOUS
  Filled 2020-06-22: qty 8.5

## 2020-06-22 NOTE — Progress Notes (Signed)
Confirmed with infusion RN that we are unable to administer 40 meq IV potassium today because of the time constraint of a 4 hour infusion and incompatibility with chemotherapy. Relayed message to Dr. Julien Nordmann and new plan is to administer 40 meq potassium orally today in the infusion clinic and to give 10 meq IV tomorrow after etoposide infusion.   Eddie Candle, PharmD PGY-2 Hematology/Oncology Pharmacy Resident

## 2020-06-22 NOTE — Progress Notes (Signed)
[place] [phone]   [phone]  OFFICE PROGRESS NOTE  Leah Mires, MD [address] [place] [address]  DIAGNOSIS: Limited stage, stage IIIb (T2 a, N2, M0) presented with right upper lobe lung mass in addition to mediastinal lymphadenopathy diagnosed in June 2021.  PRIOR THERAPY: None  CURRENT THERAPY: Systemic chemotherapy with carboplatin for AUC of 5 from day 1 and etoposide 100 mg/M2 on days 1, 2 and 3 with Neulasta support every 3 weeks.  First dose 05/11/2020.  Status post 2 cycles.    INTERVAL HISTORY: Leah Velasquez 79 y.o. female returns to the clinic today for follow-up visit accompanied by her daughter.  The patient is feeling fine today with no concerning complaints step for the generalized fatigue.  She is currently undergoing treatment for urinary tract infection with the back started on 06/18/2020.  The patient denied having any current shortness of breath except with exertion but no concerning chest pain, cough or hemoptysis.  She denied having any fever or chills.  She has no nausea, vomiting, diarrhea or constipation.  She denied having any headache or visual changes.  She has no significant weight loss.  The patient had repeat CT scan of the chest performed recently and she is here for evaluation and discussion of her risk her results.  MEDICAL HISTORY: Past Medical History:  Diagnosis Date  . A-fib ([facility])   . Dysrhythmia   . GERD (gastroesophageal reflux disease)   . Hemorrhoids   . Hyperlipidemia   . Hypertension   . Lung cancer ([place]) 2000   RLL  . Obesity   . Persistent atrial fibrillation (Leah Velasquez)   . Rheumatoid arthritis ([facility])     ALLERGIES:  is allergic to morphine, morphine and related, and sulfa antibiotics.  MEDICATIONS:  Current Outpatient Medications  Medication Sig Dispense Refill  . azithromycin (ZITHROMAX) 250 MG tablet Take by mouth daily.    . bisacodyl (DULCOLAX) 10 MG  suppository Place 10 mg rectally See admin instructions. If not relieved by MOM give 10mg  Bisacodyl supp. Rectally for x1 dose in 24 hours prn constipation     . diclofenac Sodium (VOLTAREN) 1 % GEL Apply 4 g topically 4 (four) times daily. To bilateral knees and ankles 350 g 1  . gabapentin (NEURONTIN) 100 MG capsule Take 100 mg by mouth at bedtime.    Marland Kitchen levocetirizine (XYZAL) 5 MG tablet Take 5 mg by mouth daily.     . Magnesium Hydroxide (BL MILK OF MAGNESIA PO) Take 30 mLs by mouth See admin instructions. If no BM in 3 day, give 30 cc Milk of magnesium p.o. x 1 dose in 24 hours as needed     . metoprolol succinate (TOPROL-XL) 25 MG 24 hr tablet Take 12.5 mg by mouth at bedtime.     . Mirabegron (MYRBETRIQ PO) Take 25 mg by mouth daily.    . OXYGEN Inhale 2 L into the lungs as needed (to maintain O2 saturation of 90%).    Marland Kitchen saccharomyces boulardii (FLORASTOR) 250 MG capsule Take 250 mg by mouth 2 (two) times daily.    . simvastatin (ZOCOR) 20 MG tablet Take 20 mg by mouth at bedtime.    . timolol (TIMOPTIC) 0.5 % ophthalmic solution Place 1 drop into both eyes at bedtime.     . vitamin B-12 1000 MCG tablet Take 1 tablet (1,000 mcg total) by mouth daily. 30 tablet 0  . warfarin (COUMADIN) 2.5  MG tablet Take 2.5 mg by mouth daily.    . prochlorperazine (COMPAZINE) 10 MG tablet Take 10 mg by mouth every 6 (six) hours as needed for nausea or vomiting.    . Sodium Phosphates (RA SALINE ENEMA RE) Place 1 Container rectally See admin instructions. If not relieved by bisacodyl supp. Give disposable saline enema rectally x1 dose 24 hours prn constipation      No current facility-administered medications for this visit.    SURGICAL HISTORY:  Past Surgical History:  Procedure Laterality Date  . BREAST EXCISIONAL BIOPSY Left    x 2  . BREAST EXCISIONAL BIOPSY Right   . BRONCHIAL BIOPSY  02/18/2020   Procedure: BRONCHIAL BIOPSIES;  Surgeon: Leah Gobble, MD;  Location[facility] END[facility]COPY;  Service:  Cardiopulmonary;;  . BRONCHIAL BRUSHINGS  02/18/2020   Procedure: BRONCHIAL BRUSHINGS;  Surgeon: Leah Gobble, MD;  Location: [facility];  Service: Cardiopulmonary;;  . BRONCHIAL NEEDLE ASPIRATION BIOPSY  02/18/2020   Procedure: BRONCHIAL NEEDLE ASPIRATION BIOPSIES;  Surgeon: Leah Gobble, MD;  Location: [facility];  Service: Cardiopulmonary;;  . BRONCHIAL WASHINGS  02/18/2020   Procedure: BRONCHIAL WASHINGS;  Surgeon: Leah Gobble, MD;  Location: [facility];  Service: Cardiopulmonary;;  . CARDIOVERSION N/A 01/09/2018   Procedure: CARDIOVERSION;  Surgeon: Leah Hector, MD;  Location: [facility] ENDOSCOPY;  Service: Cardiovascular;  Laterality: N/A;  . CARDIOVERSION N/A 03/01/2018   Procedure: CARDIOVERSION;  Surgeon: Leah Margarita, MD;  Location: [facility] ENDOSCOPY;  Service: Cardiovascular;  Laterality: N/A;  . ELECTROMAGNETIC NAVIGATION BROCHOSCOPY N/A 02/18/2020   Procedure: ELECTROMAGNETIC NAVIGATION BRONCHOSCOPY;  Surgeon: Leah Gobble, MD;  Location: [facility];  Service: Cardiopulmonary;  Laterality: N/A;  . KNEE SURGERY    . LUNG LOBECTOMY Right 2000   RLL removed  . VIDEO BRONCHOSCOPY N/A 02/18/2020   Procedure: VIDEO BRONCHOSCOPY WITH FLUORO;  Surgeon: Leah Gobble, MD;  Location: [facility] ENDOSCOPY;  Service: Cardiopulmonary;  Laterality: N/A;  . VIDEO BRONCHOSCOPY WITH ENDOBRONCHIAL ULTRASOUND N/A 04/22/2020   Procedure: VIDEO BRONCHOSCOPY WITH ENDOBRONCHIAL ULTRASOUND;  Surgeon: Leah Nakayama, MD;  Location: [place];  Service: Thoracic;  Laterality: N/A;    REVIEW OF SYSTEMS:  Constitutional: positive for fatigue Eyes: negative Ears, nose, mouth, throat, and face: negative Respiratory: positive for dyspnea on exertion Cardiovascular: negative Gastrointestinal: negative Genitourinary:positive for dysuria Integument/breast: negative Hematologic/lymphatic: negative Musculoskeletal:negative Neurological: negative Behavioral/Psych: negative Endocrine:  negative Allergic/Immunologic: negative   PHYSICAL EXAMINATION: General appearance: alert, cooperative, fatigued and no distress Head: Normocephalic, without obvious abnormality, atraumatic Neck: no adenopathy, no JVD, supple, symmetrical, trachea midline and thyroid not enlarged, symmetric, no tenderness/mass/nodules Lymph nodes: Cervical, supraclavicular, and axillary nodes normal. Resp: clear to auscultation bilaterally Back: symmetric, no curvature. ROM normal. No CVA tenderness. Cardio: regular rate and rhythm, S1, S2 normal, no murmur, click, rub or gallop GI: soft, non-tender; bowel sounds normal; no masses,  no organomegaly Extremities: extremities normal, atraumatic, no cyanosis or edema Neurologic: Alert and oriented X 3, normal strength and tone. Normal symmetric reflexes. Normal coordination and gait  ECOG PERFORMANCE STATUS: 1 - Symptomatic but completely ambulatory  Blood pressure (!) 141/83, pulse 91, temperature (!) 97.5 F (36.4 C), temperature source Tympanic, resp. rate 18, SpO2 96 %.  LABORATORY DATA: Lab Results  Component Value Date   WBC 13.1 (H) 06/22/2020   HGB 11.9 (L) 06/22/2020   HCT 35.9 (L) 06/22/2020   MCV 94.0 06/22/2020   PLT 344 06/22/2020      Chemistry  Component Value Date/Time   NA 138 06/16/2020 0000   K 2.3 (A) 06/16/2020 0000   CL 98 (A) 06/16/2020 0000   CO2 25 (A) 06/16/2020 0000   BUN 7 06/16/2020 0000   CREATININE 0.4 (A) 06/16/2020 0000   CREATININE 0.54 06/08/2020 1410   GLU 110 06/16/2020 0000      Component Value Date/Time   CALCIUM 8.7 06/16/2020 0000   ALKPHOS 68 06/08/2020 1410   AST 13 (L) 06/08/2020 1410   ALT 14 06/08/2020 1410   BILITOT 1.1 06/08/2020 1410       RADIOGRAPHIC STUDIES: CT Chest W Contrast  Result Date: 06/19/2020 CLINICAL DATA:  Primary Cancer Type: Lung Imaging Indication: Assess response to therapy Interval therapy since last imaging? Yes Initial Cancer Diagnosis Date: 02/18/2020;  established by: Biopsy-proven Detailed Pathology: Stage IIIb small cell lung carcinoma. Primary Tumor location: Right upper lobe. Surgeries: Right lower lobectomy 2000. Chemotherapy: Yes; Ongoing? Yes; Most recent administration: 06/01/2020 Immunotherapy? No Radiation therapy? No EXAM: CT CHEST WITH CONTRAST TECHNIQUE: Multidetector CT imaging of the chest was performed during intravenous contrast administration. CONTRAST:  19mL OMNIPAQUE IOHEXOL 300 MG/ML  SOLN COMPARISON:  03/10/2020 PET-CT.  CT chest 02/14/2020. FINDINGS: Cardiovascular: Aortic atherosclerosis. Left and right coronary artery calcifications and/or stents. Mild cardiomegaly. No pericardial effusion. Mediastinum/Nodes: Interval decrease in size of enlarged pretracheal lymph nodes, largest node measuring 1.4 x 1.2 cm, previously 2.5 x 2.3 cm (series 3, image 47). Thyroid gland, trachea, and esophagus demonstrate no significant findings. Lungs/Pleura: Status post right middle lobectomy. Interval decrease in size of a suprahilar nodule of the right upper lobe, measuring 1.7 x 1.2 cm, previously 2.1 x 1.6 cm (series 4, image 41). Interval decrease in size of nodular soft tissue involving the pleura at the right apex, measuring 1.7 x 1.4 cm, previously 2.8 x 2.1 cm (series 3, image 22). Interval decrease in irregular nodularity of the peripheral right upper lobe, largest component approximately 6 mm, previously 1.4 cm (series 4, image 52). New small right pleural effusion. Upper Abdomen: No acute abnormality. Musculoskeletal: No chest wall mass or suspicious bone lesions identified. IMPRESSION: 1. Interval decrease in size of a suprahilar nodule of the right upper lobe and nodular soft tissue involving the pleura at the right apex. 2. Interval decrease in size of enlarged pretracheal lymph nodes. 3. Findings are consistent with treatment response. 4. Interval decrease in irregular nodularity of the peripheral right upper lobe. Given appearance this more  likely reflects resolving nonspecific infection or inflammation. 5. New small right pleural effusion, nonspecific. No obvious pleural nodularity or soft tissue. 6. Status post right middle lobectomy. 7. Coronary artery disease.  Aortic Atherosclerosis (ICD10-I70.0). Electronically Signed   By: Eddie Candle M.D.   On: 06/19/2020 11:36    ASSESSMENT AND PLAN: This is a very pleasant 79 years old white female with a limited stage, stage IIIb (T2a, N2, M0) small cell lung cancer presented with right upper lobe lung mass in addition to mediastinal lymphadenopathy diagnosed in June 2021. The patient is currently undergoing treatment with systemic chemotherapy with carboplatin for AUC of 5 on day 1 and 2 etoposide 100 mg/M2 on days 1, 2 and 3 with Neulasta support.  Status post 2 cycles. She tolerated the second cycle of her treatment much better. She had repeat CT scan of the chest performed recently.  I personally and independently reviewed the scan and discussed the results with the patient and her daughter. Her scan showed improvement of her disease. I recommended  for her to proceed with cycle #3 today as planned. She will come back for follow-up visit in 3 weeks for evaluation before the next cycle of her treatment. For the hypokalemia, I will arrange for the patient to receive 40 mEq of potassium chloride intravenously in the clinic today.  She was also advised to continue with the oral potassium chloride. The patient was advised to call immediately if she has any concerning symptoms in the interval. The patient voices understanding of current disease status and treatment options and is in agreement with the current care plan.  All questions were answered. The patient knows to call the clinic with any problems, questions or concerns. We can certainly see the patient much sooner if necessary.  Disclaimer: This note was dictated with voice recognition software. Similar sounding words can inadvertently  be transcribed and may not be corrected upon review.

## 2020-06-22 NOTE — Patient Instructions (Signed)
[facility] Discharge Instructions for Patients Receiving Chemotherapy  Today you received the following chemotherapy agents: Etoposide and Carboplatin  To help prevent nausea and vomiting after your treatment, we encourage you to take your nausea medication  as prescribed.    If you develop nausea and vomiting that is not controlled by your nausea medication, call the clinic.   BELOW ARE SYMPTOMS THAT SHOULD BE REPORTED IMMEDIATELY:  *FEVER GREATER THAN 100.5 F  *CHILLS WITH OR WITHOUT FEVER  NAUSEA AND VOMITING THAT IS NOT CONTROLLED WITH YOUR NAUSEA MEDICATION  *UNUSUAL SHORTNESS OF BREATH  *UNUSUAL BRUISING OR BLEEDING  TENDERNESS IN MOUTH AND THROAT WITH OR WITHOUT PRESENCE OF ULCERS  *URINARY PROBLEMS  *BOWEL PROBLEMS  UNUSUAL RASH Items with * indicate a potential emergency and should be followed up as soon as possible.  Feel free to call the clinic should you have any questions or concerns. The clinic phone number is [phone].  Please show the [facility] at check-in to the Emergency Department and triage nurse.   Hypokalemia Hypokalemia means that the amount of potassium in the blood is lower than normal. Potassium is a chemical (electrolyte) that helps regulate the amount of fluid in the body. It also stimulates muscle tightening (contraction) and helps nerves work properly. Normally, most of the body's potassium is inside cells, and only a very small amount is in the blood. Because the amount in the blood is so small, minor changes to potassium levels in the blood can be life-threatening. What are the causes? This condition may be caused by:  Antibiotic medicine.  Diarrhea or vomiting. Taking too much of a medicine that helps you have a bowel movement (laxative) can cause diarrhea and lead to hypokalemia.  Chronic kidney disease (CKD).  Medicines that help the body get rid of excess fluid (diuretics).  Eating disorders, such as  bulimia.  Low magnesium levels in the body.  Sweating a lot. What are the signs or symptoms? Symptoms of this condition include:  Weakness.  Constipation.  Fatigue.  Muscle cramps.  Mental confusion.  Skipped heartbeats or irregular heartbeat (palpitations).  Tingling or numbness. How is this diagnosed? This condition is diagnosed with a blood test. How is this treated? This condition may be treated by:  Taking potassium supplements by mouth.  Adjusting the medicines that you take.  Eating more foods that contain a lot of potassium. If your potassium level is very low, you may need to get potassium through an IV and be monitored in the hospital. Follow these instructions at home:   Take over-the-counter and prescription medicines only as told by your health care provider. This includes vitamins and supplements.  Eat a healthy diet. A healthy diet includes fresh fruits and vegetables, whole grains, healthy fats, and lean proteins.  If instructed, eat more foods that contain a lot of potassium. This includes: ? Nuts, such as peanuts and pistachios. ? Seeds, such as sunflower seeds and pumpkin seeds. ? Peas, lentils, and lima beans. ? Whole grain and bran cereals and breads. ? Fresh fruits and vegetables, such as apricots, avocado, bananas, cantaloupe, kiwi, oranges, tomatoes, asparagus, and potatoes. ? Orange juice. ? Tomato juice. ? Red meats. ? Yogurt.  Keep all follow-up visits as told by your health care provider. This is important. Contact a health care provider if you:  Have weakness that gets worse.  Feel your heart pounding or racing.  Vomit.  Have diarrhea.  Have diabetes (diabetes mellitus) and you  have trouble keeping your blood sugar (glucose) in your target range. Get help right away if you:  Have chest pain.  Have shortness of breath.  Have vomiting or diarrhea that lasts for more than 2 days.  Faint. Summary  Hypokalemia means that  the amount of potassium in the blood is lower than normal.  This condition is diagnosed with a blood test.  Hypokalemia may be treated by taking potassium supplements, adjusting the medicines that you take, or eating more foods that are high in potassium.  If your potassium level is very low, you may need to get potassium through an IV and be monitored in the hospital. This information is not intended to replace advice given to you by your health care provider. Make sure you discuss any questions you have with your health care provider. Document Revised: 03/21/2018 Document Reviewed: 03/21/2018 Elsevier Patient Education  [place].

## 2020-06-23 ENCOUNTER — Other Ambulatory Visit: Payer: Self-pay

## 2020-06-23 ENCOUNTER — Inpatient Hospital Stay: Payer: Medicare HMO

## 2020-06-23 VITALS — BP 113/83 | HR 84 | Temp 98.1°F | Resp 18

## 2020-06-23 DIAGNOSIS — Z5111 Encounter for antineoplastic chemotherapy: Secondary | ICD-10-CM | POA: Diagnosis not present

## 2020-06-23 DIAGNOSIS — C3411 Malignant neoplasm of upper lobe, right bronchus or lung: Secondary | ICD-10-CM

## 2020-06-23 MED ORDER — SODIUM CHLORIDE 0.9 % IV SOLN
10.0000 mg | Freq: Once | INTRAVENOUS | Status: AC
  Administered 2020-06-23: 10 mg via INTRAVENOUS
  Filled 2020-06-23: qty 10

## 2020-06-23 MED ORDER — SODIUM CHLORIDE 0.9 % IV SOLN
INTRAVENOUS | Status: DC
  Filled 2020-06-23: qty 250

## 2020-06-23 MED ORDER — SODIUM CHLORIDE 0.9 % IV SOLN
Freq: Once | INTRAVENOUS | Status: AC
  Filled 2020-06-23: qty 250

## 2020-06-23 MED ORDER — SODIUM CHLORIDE 0.9 % IV SOLN
80.0000 mg/m2 | Freq: Once | INTRAVENOUS | Status: AC
  Administered 2020-06-23: 170 mg via INTRAVENOUS
  Filled 2020-06-23: qty 8.5

## 2020-06-23 MED ORDER — POTASSIUM CHLORIDE 10 MEQ/100ML IV SOLN
INTRAVENOUS | Status: AC
  Filled 2020-06-23: qty 100

## 2020-06-23 MED ORDER — POTASSIUM CHLORIDE 10 MEQ/100ML IV SOLN
10.0000 meq | Freq: Once | INTRAVENOUS | Status: AC
  Administered 2020-06-23: 10 meq via INTRAVENOUS

## 2020-06-23 NOTE — Patient Instructions (Signed)
[facility] Discharge Instructions for Patients Receiving Chemotherapy  Today you received the following chemotherapy agents Etoposide  To help prevent nausea and vomiting after your treatment, we encourage you to take your nausea medication as directed.   If you develop nausea and vomiting that is not controlled by your nausea medication, call the clinic.   BELOW ARE SYMPTOMS THAT SHOULD BE REPORTED IMMEDIATELY:  *FEVER GREATER THAN 100.5 F  *CHILLS WITH OR WITHOUT FEVER  NAUSEA AND VOMITING THAT IS NOT CONTROLLED WITH YOUR NAUSEA MEDICATION  *UNUSUAL SHORTNESS OF BREATH  *UNUSUAL BRUISING OR BLEEDING  TENDERNESS IN MOUTH AND THROAT WITH OR WITHOUT PRESENCE OF ULCERS  *URINARY PROBLEMS  *BOWEL PROBLEMS  UNUSUAL RASH Items with * indicate a potential emergency and should be followed up as soon as possible.  Feel free to call the clinic should you have any questions or concerns. The clinic phone number is [phone].  Please show the [facility] at check-in to the Emergency Department and triage nurse.   Hypokalemia Hypokalemia means that the amount of potassium in the blood is lower than normal. Potassium is a chemical (electrolyte) that helps regulate the amount of fluid in the body. It also stimulates muscle tightening (contraction) and helps nerves work properly. Normally, most of the body's potassium is inside cells, and only a very small amount is in the blood. Because the amount in the blood is so small, minor changes to potassium levels in the blood can be life-threatening. What are the causes? This condition may be caused by:  Antibiotic medicine.  Diarrhea or vomiting. Taking too much of a medicine that helps you have a bowel movement (laxative) can cause diarrhea and lead to hypokalemia.  Chronic kidney disease (CKD).  Medicines that help the body get rid of excess fluid (diuretics).  Eating disorders, such as bulimia.  Low magnesium  levels in the body.  Sweating a lot. What are the signs or symptoms? Symptoms of this condition include:  Weakness.  Constipation.  Fatigue.  Muscle cramps.  Mental confusion.  Skipped heartbeats or irregular heartbeat (palpitations).  Tingling or numbness. How is this diagnosed? This condition is diagnosed with a blood test. How is this treated? This condition may be treated by:  Taking potassium supplements by mouth.  Adjusting the medicines that you take.  Eating more foods that contain a lot of potassium. If your potassium level is very low, you may need to get potassium through an IV and be monitored in the hospital. Follow these instructions at home:   Take over-the-counter and prescription medicines only as told by your health care provider. This includes vitamins and supplements.  Eat a healthy diet. A healthy diet includes fresh fruits and vegetables, whole grains, healthy fats, and lean proteins.  If instructed, eat more foods that contain a lot of potassium. This includes: ? Nuts, such as peanuts and pistachios. ? Seeds, such as sunflower seeds and pumpkin seeds. ? Peas, lentils, and lima beans. ? Whole grain and bran cereals and breads. ? Fresh fruits and vegetables, such as apricots, avocado, bananas, cantaloupe, kiwi, oranges, tomatoes, asparagus, and potatoes. ? Orange juice. ? Tomato juice. ? Red meats. ? Yogurt.  Keep all follow-up visits as told by your health care provider. This is important. Contact a health care provider if you:  Have weakness that gets worse.  Feel your heart pounding or racing.  Vomit.  Have diarrhea.  Have diabetes (diabetes mellitus) and you have trouble keeping your  blood sugar (glucose) in your target range. Get help right away if you:  Have chest pain.  Have shortness of breath.  Have vomiting or diarrhea that lasts for more than 2 days.  Faint. Summary  Hypokalemia means that the amount of potassium  in the blood is lower than normal.  This condition is diagnosed with a blood test.  Hypokalemia may be treated by taking potassium supplements, adjusting the medicines that you take, or eating more foods that are high in potassium.  If your potassium level is very low, you may need to get potassium through an IV and be monitored in the hospital. This information is not intended to replace advice given to you by your health care provider. Make sure you discuss any questions you have with your health care provider. Document Revised: 03/21/2018 Document Reviewed: 03/21/2018 Elsevier Patient Education  [place].

## 2020-06-24 ENCOUNTER — Inpatient Hospital Stay: Payer: Medicare HMO

## 2020-06-24 VITALS — BP 144/75 | HR 89 | Temp 97.5°F | Resp 18

## 2020-06-24 DIAGNOSIS — Z5111 Encounter for antineoplastic chemotherapy: Secondary | ICD-10-CM

## 2020-06-24 DIAGNOSIS — C3411 Malignant neoplasm of upper lobe, right bronchus or lung: Secondary | ICD-10-CM

## 2020-06-24 MED ORDER — SODIUM CHLORIDE 0.9 % IV SOLN
10.0000 mg | Freq: Once | INTRAVENOUS | Status: AC
  Administered 2020-06-24: 10 mg via INTRAVENOUS
  Filled 2020-06-24: qty 10

## 2020-06-24 MED ORDER — ACETAMINOPHEN 500 MG PO TABS: 500.0000 mg | ORAL_TABLET | Freq: Once | ORAL | Status: DC

## 2020-06-24 MED ORDER — ACETAMINOPHEN 500 MG PO TABS
ORAL_TABLET | ORAL | Status: AC
  Filled 2020-06-24: qty 1

## 2020-06-24 MED ORDER — SODIUM CHLORIDE 0.9 % IV SOLN
80.0000 mg/m2 | Freq: Once | INTRAVENOUS | Status: AC
  Administered 2020-06-24: 170 mg via INTRAVENOUS
  Filled 2020-06-24: qty 8.5

## 2020-06-24 MED ORDER — SODIUM CHLORIDE 0.9 % IV SOLN
Freq: Once | INTRAVENOUS | Status: AC
  Filled 2020-06-24: qty 250

## 2020-06-24 NOTE — Patient Instructions (Signed)
[facility] Discharge Instructions for Patients Receiving Chemotherapy  Today you received the following chemotherapy agents: Etoposide  To help prevent nausea and vomiting after your treatment, we encourage you to take your nausea medication as directed.   If you develop nausea and vomiting that is not controlled by your nausea medication, call the clinic.   BELOW ARE SYMPTOMS THAT SHOULD BE REPORTED IMMEDIATELY:  *FEVER GREATER THAN 100.5 F  *CHILLS WITH OR WITHOUT FEVER  NAUSEA AND VOMITING THAT IS NOT CONTROLLED WITH YOUR NAUSEA MEDICATION  *UNUSUAL SHORTNESS OF BREATH  *UNUSUAL BRUISING OR BLEEDING  TENDERNESS IN MOUTH AND THROAT WITH OR WITHOUT PRESENCE OF ULCERS  *URINARY PROBLEMS  *BOWEL PROBLEMS  UNUSUAL RASH Items with * indicate a potential emergency and should be followed up as soon as possible.  Feel free to call the clinic should you have any questions or concerns. The clinic phone number is [phone].  Please show the [facility] at check-in to the Emergency Department and triage nurse.

## 2020-06-26 ENCOUNTER — Other Ambulatory Visit: Payer: Self-pay

## 2020-06-26 ENCOUNTER — Inpatient Hospital Stay: Payer: Medicare HMO

## 2020-06-26 VITALS — BP 123/79 | HR 74 | Resp 18

## 2020-06-26 DIAGNOSIS — Z5111 Encounter for antineoplastic chemotherapy: Secondary | ICD-10-CM | POA: Diagnosis not present

## 2020-06-26 DIAGNOSIS — C3411 Malignant neoplasm of upper lobe, right bronchus or lung: Secondary | ICD-10-CM

## 2020-06-26 MED ORDER — PEGFILGRASTIM-JMDB 6 MG/0.6ML ~~LOC~~ SOSY
6.0000 mg | PREFILLED_SYRINGE | Freq: Once | SUBCUTANEOUS | Status: AC
  Administered 2020-06-26: 6 mg via SUBCUTANEOUS

## 2020-06-26 MED ORDER — PEGFILGRASTIM-JMDB 6 MG/0.6ML ~~LOC~~ SOSY
PREFILLED_SYRINGE | SUBCUTANEOUS | Status: AC
  Filled 2020-06-26: qty 0.6

## 2020-06-26 NOTE — Patient Instructions (Signed)

## 2020-06-29 ENCOUNTER — Non-Acute Institutional Stay (SKILLED_NURSING_FACILITY): Payer: Medicare HMO | Admitting: Family

## 2020-06-29 ENCOUNTER — Ambulatory Visit: Payer: Medicare HMO | Admitting: General Practice

## 2020-06-29 ENCOUNTER — Encounter: Payer: Self-pay | Admitting: Family

## 2020-06-29 ENCOUNTER — Other Ambulatory Visit: Payer: Medicare HMO

## 2020-06-29 DIAGNOSIS — D701 Agranulocytosis secondary to cancer chemotherapy: Secondary | ICD-10-CM

## 2020-06-29 DIAGNOSIS — C3411 Malignant neoplasm of upper lobe, right bronchus or lung: Secondary | ICD-10-CM

## 2020-06-29 DIAGNOSIS — T451X5A Adverse effect of antineoplastic and immunosuppressive drugs, initial encounter: Secondary | ICD-10-CM | POA: Diagnosis not present

## 2020-06-29 DIAGNOSIS — E876 Hypokalemia: Secondary | ICD-10-CM | POA: Diagnosis not present

## 2020-06-29 LAB — CBC AND DIFFERENTIAL
HCT: 33 — AB (ref 36–46)
Hemoglobin: 10.7 — AB (ref 12.0–16.0)
Platelets: 156 (ref 150–399)
WBC: 2.5

## 2020-06-29 LAB — BASIC METABOLIC PANEL
BUN: 8 (ref 4–21)
CO2: 28 — AB (ref 13–22)
Chloride: 101 (ref 99–108)
Creatinine: 0.3 — AB (ref 0.5–1.1)
Glucose: 96
Potassium: 3.2 — AB (ref 3.4–5.3)
Sodium: 142 (ref 137–147)

## 2020-06-29 LAB — COMPREHENSIVE METABOLIC PANEL
Calcium: 9 (ref 8.7–10.7)
GFR calc Af Amer: >90
GFR calc non Af Amer: >90
Globulin: 2.2

## 2020-06-29 LAB — CBC: RBC: 3.47 — AB (ref 3.87–5.11)

## 2020-06-29 LAB — HEPATIC FUNCTION PANEL
ALT: 14 (ref 7–35)
AST: 13 (ref 13–35)

## 2020-06-29 NOTE — Progress Notes (Signed)
Location:  [place] Room Number: 211-D Place of Service:  SNF [phone]) Provider: Lamaria Hildebrandt FNP-C  Velasquez, Leah Rodney, MD  Patient Care Team: Leah Mires, MD as PCP - General (Family Medicine) Leah Harp, MD as PCP - Cardiology (Cardiology) Leah Pound, MD as Consulting Physician (Rheumatology) Leah Hart, RN as Oncology Nurse Navigator  Extended Emergency Contact Information Primary Emergency Contact: Leah Velasquez Mobile Phone: [phone] Relation: Daughter Secondary Emergency Contact: Leah Velasquez Mobile Phone: [phone] Relation: Daughter Preferred language: English Interpreter needed? No  Code Status:  Full Code  Goals of care: Advanced Directive information Advanced Directives 06/22/2020  Does Patient Have a Medical Advance Directive? No  Type of Advance Directive -  Does patient want to make changes to medical advance directive? -  Copy of [place] in Chart? -  Would patient like information on creating a medical advance directive? No - Patient declined     Chief Complaint  Patient presents with  . Acute Visit    Abnormal Labs.    HPI:  Pt is a 79 y.o. 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      Past Medical History:  Diagnosis Date  . A-fib ([place])   . Dysrhythmia   . GERD (gastroesophageal reflux disease)   . Hemorrhoids   . Hyperlipidemia   . Hypertension   . Lung cancer ([place]) 2000   RLL  . Obesity   . Persistent atrial fibrillation ([place])   . Rheumatoid arthritis [facility])    Past Surgical History:  Procedure Laterality Date  .  BREAST EXCISIONAL BIOPSY Left    x 2  . BREAST EXCISIONAL BIOPSY Right   . BRONCHIAL BIOPSY  02/18/2020   Procedure: BRONCHIAL BIOPSIES;  Surgeon: Collene Gobble, MD;  Location: [facility] ENDOSCOPY;  Service: Cardiopulmonary;;  . BRONCHIAL BRUSHINGS  02/18/2020   Procedure: BRONCHIAL BRUSHINGS;  Surgeon: Collene Gobble, MD;  Location: [place];  Service: Cardiopulmonary;;  . BRONCHIAL NEEDLE ASPIRATION BIOPSY  02/18/2020   Procedure: BRONCHIAL NEEDLE ASPIRATION BIOPSIES;  Surgeon: Collene Gobble, MD;  Location: [place];  Service: Cardiopulmonary;;  . BRONCHIAL WASHINGS  02/18/2020   Procedure: BRONCHIAL WASHINGS;  Surgeon: Collene Gobble, MD;  Location: [place];  Service: Cardiopulmonary;;  . CARDIOVERSION N/A 01/09/2018   Procedure: CARDIOVERSION;  Surgeon: Josue Hector, MD;  Location: [facility] ENDOSCOPY;  Service: Cardiovascular;  Laterality: N/A;  . CARDIOVERSION N/A 03/01/2018   Procedure: CARDIOVERSION;  Surgeon: Sueanne Margarita, MD;  Location: [facility] ENDOSCOPY;  Service: Cardiovascular;  Laterality: N/A;  . ELECTROMAGNETIC NAVIGATION BROCHOSCOPY N/A 02/18/2020   Procedure: ELECTROMAGNETIC NAVIGATION BRONCHOSCOPY;  Surgeon: Collene Gobble, MD;  Location: [place];  Service: Cardiopulmonary;  Laterality: N/A;  . KNEE SURGERY    . LUNG LOBECTOMY Right 2000   RLL removed  . VIDEO BRONCHOSCOPY N/A 02/18/2020   Procedure: VIDEO BRONCHOSCOPY WITH FLUORO;  Surgeon: Collene Gobble, MD;  Location: [facility] ENDOSCOPY;  Service: Cardiopulmonary;  Laterality: N/A;  . VIDEO BRONCHOSCOPY WITH ENDOBRONCHIAL ULTRASOUND N/A 04/22/2020   Procedure: VIDEO BRONCHOSCOPY WITH ENDOBRONCHIAL ULTRASOUND;  Surgeon: [name], MD;  Location: [place];  Service: Thoracic;  Laterality: N/A;    Allergies  Allergen Reactions  . Morphine Anaphylaxis  . Morphine And Related Other (See Comments)    "vitals go down", pass out  . Sulfa Antibiotics Rash    Outpatient Encounter Medications as of 06/29/2020   Medication Sig  . azithromycin (ZITHROMAX) 250 MG tablet Take by mouth daily.  . bisacodyl (DULCOLAX) 10 MG suppository Place 10 mg rectally See admin instructions. If not relieved by MOM give 10mg  Bisacodyl supp. Rectally for x1 dose in 24 hours prn constipation   . diclofenac Sodium (VOLTAREN) 1 % GEL Apply 4 g topically 4 (four) times daily. To bilateral knees and ankles  . gabapentin (NEURONTIN) 100 MG capsule Take 100 mg by mouth at bedtime.  Marland Kitchen levocetirizine (XYZAL) 5 MG tablet Take 5 mg by mouth daily.   . Magnesium Hydroxide (BL MILK OF MAGNESIA PO) Take 30 mLs by mouth See admin instructions. If no BM in 3 day, give 30 cc Milk of magnesium p.o. x 1 dose in 24 hours as needed   . metoprolol succinate (TOPROL-XL) 25 MG 24 hr tablet Take 12.5 mg by mouth at bedtime.   . Mirabegron (MYRBETRIQ PO) Take 25 mg by mouth daily.  . OXYGEN Inhale 2 L into the lungs as needed (to maintain O2 saturation of 90%).  . potassium chloride SA (KLOR-CON) 20 MEQ tablet Take 2 tablets (40 mEq total) by mouth 2 (two) times daily.  . prochlorperazine (COMPAZINE) 10 MG tablet Take 10 mg by mouth every 6 (six) hours as needed for nausea or vomiting.  . simvastatin (ZOCOR) 20 MG tablet Take 20 mg by mouth at bedtime.  . Sodium Phosphates (RA SALINE ENEMA RE) Place 1 Container rectally See admin instructions. If not relieved by bisacodyl supp. Give disposable saline enema rectally x1 dose 24 hours prn constipation   . timolol (TIMOPTIC) 0.5 % ophthalmic solution Place 1 drop into both eyes at bedtime.   . vitamin B-12 1000 MCG tablet Take 1 tablet (1,000 mcg total) by mouth daily.  Marland Kitchen warfarin (COUMADIN) 2.5 MG tablet Take 2.5 mg by mouth daily.   No facility-administered encounter medications on file as of 06/29/2020.    Review of Systems  Constitutional: Negative for appetite change, chills and fever.  HENT: Negative for congestion, rhinorrhea, sinus pressure, sinus pain, sneezing and sore throat.    Respiratory: Negative for cough, chest tightness, shortness of breath and wheezing.   Cardiovascular: Negative for chest pain, palpitations and leg swelling.  Gastrointestinal: Negative for abdominal distention, abdominal pain, constipation, diarrhea, nausea and vomiting.  Musculoskeletal: Positive for gait problem. Negative for joint swelling and myalgias.  Skin: Negative for color change, pallor and rash.  Neurological: Negative for dizziness, speech difficulty, light-headedness, numbness and headaches.  Hematological: Does not bruise/bleed easily.  Psychiatric/Behavioral: Negative for agitation, confusion and sleep disturbance. The patient is not nervous/anxious.     Immunization History  Administered Date(s) Administered  . Influenza, High Dose Seasonal PF 06/27/2016, 05/23/2017, 06/08/2018  . Moderna SARS-COVID-2 Vaccination 11/12/2019, 12/10/2019  . Pneumococcal Conjugate-13 01/04/2017  . Pneumococcal Polysaccharide-23 01/17/2018   Pertinent  Health Maintenance Due  Topic Date Due  . INFLUENZA VACCINE  08/21/2020 (Originally 03/22/2020)  . DEXA SCAN  Completed  . PNA vac Low Risk Adult  Completed   No flowsheet data found. Functional Status Survey:    Vitals:   06/29/20 1658  BP: 136/72  Pulse: 71  Resp: 16  Temp: (!) 97 F (36.1 C)  SpO2:  96%  Weight: 225 lb 12.8 oz (102.4 kg)  Height: 5\' 1"  (1.549 m)   Body mass index is 42.66 kg/m. Physical Exam Vitals and nursing note reviewed.  Constitutional:      General: She is not in acute distress.    Appearance: She is morbidly obese. She is not ill-appearing.  HENT:     Head: Normocephalic.     Nose: Nose normal. No congestion or rhinorrhea.     Mouth/Throat:     Mouth: Mucous membranes are moist.     Pharynx: Oropharynx is clear. No oropharyngeal exudate or posterior oropharyngeal erythema.  Eyes:     General: No scleral icterus.       Right eye: No discharge.        Left eye: No discharge.      Conjunctiva/sclera: Conjunctivae normal.     Pupils: Pupils are equal, round, and reactive to light.  Cardiovascular:     Rate and Rhythm: Normal rate and regular rhythm.     Pulses: Normal pulses.     Heart sounds: Normal heart sounds. No murmur heard.  No friction rub. No gallop.   Pulmonary:     Effort: Pulmonary effort is normal. No respiratory distress.     Breath sounds: Normal breath sounds. No wheezing, rhonchi or rales.  Chest:     Chest wall: No tenderness.  Abdominal:     General: Bowel sounds are normal. There is no distension.     Palpations: Abdomen is soft. There is no mass.     Tenderness: There is no abdominal tenderness. There is no right CVA tenderness, left CVA tenderness, guarding or rebound.  Musculoskeletal:        General: No swelling or tenderness.     Cervical back: Normal range of motion. No rigidity or tenderness.     Right lower leg: No edema.     Left lower leg: No edema.     Comments: Moves x 4 extremities   Lymphadenopathy:     Cervical: No cervical adenopathy.  Skin:    General: Skin is warm.     Coloration: Skin is not pale.     Findings: No bruising, erythema or rash.  Neurological:     Mental Status: She is alert and oriented to person, place, and time.     Cranial Nerves: No cranial nerve deficit.     Sensory: No sensory deficit.     Motor: No weakness.     Gait: Gait abnormal.  Psychiatric:        Mood and Affect: Mood normal.        Behavior: Behavior normal.        Thought Content: Thought content normal.        Judgment: Judgment normal.     Labs reviewed: Recent Labs    02/15/20 0121 02/16/20 0431 04/18/20 0239 04/18/20 0239 04/19/20 0801 04/19/20 0801 04/20/20 0807 04/21/20 0727 06/01/20 1025 06/01/20 1025 06/08/20 1410 06/16/20 0000 06/22/20 1320  NA 138   < > 137   < > 137   < > 138   < > 135   < > 138 138 139  K 3.5   < > 4.8   < > 3.6   < > 3.4*   < > 3.4*   < > 3.0* 2.3* 2.8*  CL 102   < > 106   < > 104   < >  103   < > 102   < > 101 98*  103  CO2 23   < > 24   < > 22   < > 23   < > 25   < > 31 25* 29  GLUCOSE 99   < > 89   < > 88   < > 90   < > 77  --  98  --  93  BUN 12   < > 10   < > 11   < > 10   < > 6*   < > 10 7 5*  CREATININE 0.82   < > 0.85   < > 0.77   < > 0.80   < > 0.58   < > 0.54 0.4* 0.59  CALCIUM 9.0   < > 8.6*   < > 8.6*   < > 8.7*   < > 9.8   < > 9.1 8.7 9.1  MG 1.9   < > 2.2  --  1.8  --  1.8  --   --   --   --   --   --   PHOS 3.0  --   --   --   --   --   --   --   --   --   --   --   --    < > = values in this interval not displayed.   Recent Labs    06/01/20 1025 06/08/20 1410 06/22/20 1320  AST 29 13* 26  ALT 38 14 36  ALKPHOS 95 68 84  BILITOT 0.6 1.1 0.5  PROT 6.8 5.8* 6.0*  ALBUMIN 3.0* 2.8* 2.7*   Recent Labs    06/01/20 1025 06/01/20 1025 06/08/20 1410 06/16/20 0000 06/22/20 1320  WBC 3.3*   < > 1.1* 13.2 13.1*  NEUTROABS 1.0*  --  0.4*  --  8.5*  HGB 13.9   < > 11.7* 11.6* 11.9*  HCT 40.6   < > 34.5* 35* 35.9*  MCV 92.9  --  91.3  --  94.0  PLT 212   < > 114* 54* 344   < > = values in this interval not displayed.   Lab Results  Component Value Date   TSH 2.458 02/15/2020   Lab Results  Component Value Date   HGBA1C 5.7 (H) 02/15/2020   No results found for: CHOL, HDL, LDLCALC, LDLDIRECT, TRIG, CHOLHDL  Significant Diagnostic Results in last 30 days:  CT Chest W Contrast  Result Date: 06/19/2020 CLINICAL DATA:  Primary Cancer Type: Lung Imaging Indication: Assess response to therapy Interval therapy since last imaging? 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 Primary Tumor location: Right upper lobe. Surgeries: Right lower lobectomy 2000. Chemotherapy: Yes; Ongoing? Yes; Most recent administration: 06/01/2020 Immunotherapy? No Radiation therapy? No EXAM: CT CHEST WITH CONTRAST TECHNIQUE: Multidetector CT imaging of the chest was performed during  intravenous contrast administration. CONTRAST:  55mL OMNIPAQUE IOHEXOL 300 MG/ML  SOLN COMPARISON:  03/10/2020 PET-CT.  CT chest 02/14/2020. FINDINGS: Cardiovascular: Aortic atherosclerosis. Left and right coronary artery calcifications and/or stents. Mild cardiomegaly. No pericardial effusion. Mediastinum/Nodes: Interval decrease in size of enlarged pretracheal lymph nodes, largest node measuring 1.4 x 1.2 cm, previously 2.5 x 2.3 cm (series 3, image 47). Thyroid gland, trachea, and esophagus demonstrate no significant findings. Lungs/Pleura: Status post right middle lobectomy. Interval decrease in size of a suprahilar nodule of the right upper lobe, measuring 1.7 x 1.2 cm, previously 2.1 x 1.6  cm (series 4, image 41). Interval decrease in size of nodular soft tissue involving the pleura at the right apex, measuring 1.7 x 1.4 cm, previously 2.8 x 2.1 cm (series 3, image 22). Interval decrease in irregular nodularity of the peripheral right upper lobe, largest component approximately 6 mm, previously 1.4 cm (series 4, image 52). New small right pleural effusion. Upper Abdomen: No acute abnormality. Musculoskeletal: No chest wall mass or suspicious bone lesions identified. IMPRESSION: 1. Interval decrease in size of a suprahilar nodule of the right upper lobe and nodular soft tissue involving the pleura at the right apex. 2. Interval decrease in size of enlarged pretracheal lymph nodes. 3. Findings are consistent with treatment response. 4. Interval decrease in irregular nodularity of the peripheral right upper lobe. Given appearance this more likely reflects resolving nonspecific infection or inflammation. 5. New small right pleural effusion, nonspecific. No obvious pleural nodularity or soft tissue. 6. Status post right middle lobectomy. 7. Coronary artery disease.  Aortic Atherosclerosis (ICD10-I70.0). Electronically Signed   By: Eddie Candle M.D.   On: 06/19/2020 11:36    Assessment/Plan 1. Hypokalemia K+  3.2  - potassium chloride 40 meQ tablet one by mouth x 1 dose at today 10 pm. - continue potassium chloride 40 meq tablet twice daily  - Recheck BMP 07/01/2020   2. Chemotherapy-induced neutropenia (HCC) Afebrile.  WBC 1.2 has improved compared to previous. chemotherapy infusion side effects.continue to monitor. - continue to follow up with Oncologist as directed.   3. Small cell lung cancer, right upper lobe ([place]) - continue to follow up with Oncologist as directed.   Family/ staff Communication: Reviewed plan of care with patient and facility Nurse   Labs/tests ordered: Recheck BMP 07/01/2020   Next Appointment: As needed.   Sandrea Hughs, NP

## 2020-06-30 ENCOUNTER — Encounter: Payer: Self-pay | Admitting: Family

## 2020-06-30 NOTE — Addendum Note (Signed)
Addended by: Heriberto Antigua E on: 06/30/2020 08:19 AM   Modules accepted: Orders

## 2020-07-02 ENCOUNTER — Non-Acute Institutional Stay (SKILLED_NURSING_FACILITY): Payer: Medicare HMO | Admitting: Orthopedic Surgery

## 2020-07-02 ENCOUNTER — Encounter: Payer: Self-pay | Admitting: Orthopedic Surgery

## 2020-07-02 DIAGNOSIS — E876 Hypokalemia: Secondary | ICD-10-CM

## 2020-07-02 DIAGNOSIS — R197 Diarrhea, unspecified: Secondary | ICD-10-CM

## 2020-07-02 DIAGNOSIS — R350 Frequency of micturition: Secondary | ICD-10-CM | POA: Diagnosis not present

## 2020-07-02 NOTE — Progress Notes (Signed)
Location:  [place] Room Number: 211/D Place of Service:  SNF [phone]) Provider:  Windell Moulding, AGNP-C  [name], MD  Patient Care Team: [name], MD as PCP - General (Family Medicine) Lorretta Harp, MD as PCP - Cardiology (Cardiology) Gavin Pound, MD as Consulting Physician (Rheumatology) Valrie Hart, RN as Oncology Nurse Navigator  Extended Emergency Contact Information Primary Emergency Contact: [name] Mobile Phone: [phone] Relation: Daughter Secondary Emergency Contact: [name] Mobile Phone: [phone] Relation: Daughter Preferred language: English Interpreter needed? No  Code Status:  Full Goals of care: Advanced Directive information Advanced Directives 07/02/2020  Does Patient Have a Medical Advance Directive? Yes  Type of Advance Directive -  Does patient want to make changes to medical advance directive? No - Patient declined  Copy of [place] in Chart? -  Would patient like information on creating a medical advance directive? -     Chief Complaint  Patient presents with  . Acute Visit    Low Potassium    HPI:  Pt is a 79 y.o. female seen today for an acute visit for low potassium, urinary frequency and diarrhea.   She is a resident at [facility]. Seen today at beside, she c/o of urinary frequency without burning. States she is not drinking fluids well. Cannot recall how many times she has urinated today due to incontinence. Very sleepy throughout the day due to chemo treatments for lung cancer. Last treatment 06/26/2020. In addition, she has had 3 episodes of diarrhea in the past 2 days. Denies abdominal pain or bloody stools. Thinks the chemo gives her diarrhea at times.     Past Medical History:  Diagnosis Date  . A-fib ([place])   . Dysrhythmia   . GERD (gastroesophageal reflux disease)   . Hemorrhoids   . Hyperlipidemia   . Hypertension    . Lung cancer ([place]) 2000   RLL  . Obesity   . Persistent atrial fibrillation ([place])   . Rheumatoid arthritis [facility])    Past Surgical History:  Procedure Laterality Date  . BREAST EXCISIONAL BIOPSY Left    x 2  . BREAST EXCISIONAL BIOPSY Right   . BRONCHIAL BIOPSY  02/18/2020   Procedure: BRONCHIAL BIOPSIES;  Surgeon: Collene Gobble, MD;  Location: [facility] ENDOSCOPY;  Service: Cardiopulmonary;;  . BRONCHIAL BRUSHINGS  02/18/2020   Procedure: BRONCHIAL BRUSHINGS;  Surgeon: Collene Gobble, MD;  Location: [place];  Service: Cardiopulmonary;;  . BRONCHIAL NEEDLE ASPIRATION BIOPSY  02/18/2020   Procedure: BRONCHIAL NEEDLE ASPIRATION BIOPSIES;  Surgeon: Collene Gobble, MD;  Location: [place];  Service: Cardiopulmonary;;  . BRONCHIAL WASHINGS  02/18/2020   Procedure: BRONCHIAL WASHINGS;  Surgeon: Collene Gobble, MD;  Location: [place];  Service: Cardiopulmonary;;  . CARDIOVERSION N/A 01/09/2018   Procedure: CARDIOVERSION;  Surgeon: Josue Hector, MD;  Location: [facility] ENDOSCOPY;  Service: Cardiovascular;  Laterality: N/A;  . CARDIOVERSION N/A 03/01/2018   Procedure: CARDIOVERSION;  Surgeon: Sueanne Margarita, MD;  Location: [facility] ENDOSCOPY;  Service: Cardiovascular;  Laterality: N/A;  . ELECTROMAGNETIC NAVIGATION BROCHOSCOPY N/A 02/18/2020   Procedure: ELECTROMAGNETIC NAVIGATION BRONCHOSCOPY;  Surgeon: Collene Gobble, MD;  Location: [place];  Service: Cardiopulmonary;  Laterality: N/A;  . KNEE SURGERY    . LUNG LOBECTOMY Right 2000   RLL removed  . VIDEO BRONCHOSCOPY N/A 02/18/2020   Procedure: VIDEO BRONCHOSCOPY WITH FLUORO;  Surgeon: Collene Gobble, MD;  Location[facility]  ENDOSCOPY;  Service: Cardiopulmonary;  Laterality: N/A;  . VIDEO BRONCHOSCOPY WITH ENDOBRONCHIAL ULTRASOUND N/A 04/22/2020   Procedure: VIDEO BRONCHOSCOPY WITH ENDOBRONCHIAL ULTRASOUND;  Surgeon: [name], MD;  Location: [facility] OR;  Service: Thoracic;  Laterality: N/A;    Allergies  Allergen Reactions  .  Morphine Anaphylaxis  . Morphine And Related Other (See Comments)    "vitals go down", pass out  . Sulfa Antibiotics Rash    Outpatient Encounter Medications as of 07/02/2020  Medication Sig  . bisacodyl (DULCOLAX) 10 MG suppository Place 10 mg rectally See admin instructions. If not relieved by MOM give 10mg  Bisacodyl supp. Rectally for x1 dose in 24 hours prn constipation   . diclofenac Sodium (VOLTAREN) 1 % GEL Apply 4 g topically 4 (four) times daily. To bilateral knees and ankles  . gabapentin (NEURONTIN) 100 MG capsule Take 100 mg by mouth at bedtime.  Marland Kitchen levocetirizine (XYZAL) 5 MG tablet Take 5 mg by mouth daily.   . Magnesium Hydroxide (BL MILK OF MAGNESIA PO) Take 30 mLs by mouth See admin instructions. If no BM in 3 day, give 30 cc Milk of magnesium p.o. x 1 dose in 24 hours as needed   . metoprolol succinate (TOPROL-XL) 25 MG 24 hr tablet Take 12.5 mg by mouth at bedtime.   . Mirabegron (MYRBETRIQ PO) Take 25 mg by mouth daily.  . OXYGEN Inhale 2 L into the lungs as needed (to maintain O2 saturation of 90%).  Marland Kitchen prochlorperazine (COMPAZINE) 10 MG tablet Take 10 mg by mouth every 6 (six) hours as needed for nausea or vomiting.  . simvastatin (ZOCOR) 20 MG tablet Take 20 mg by mouth at bedtime.  . Sodium Phosphates (RA SALINE ENEMA RE) Place 1 Container rectally See admin instructions. If not relieved by bisacodyl supp. Give disposable saline enema rectally x1 dose 24 hours prn constipation   . timolol (TIMOPTIC) 0.5 % ophthalmic solution Place 1 drop into both eyes at bedtime.   . vitamin B-12 1000 MCG tablet Take 1 tablet (1,000 mcg total) by mouth daily.  Marland Kitchen warfarin (COUMADIN) 2.5 MG tablet Take 2.5 mg by mouth daily.   No facility-administered encounter medications on file as of 07/02/2020.    Review of Systems  Constitutional: Negative for activity change, appetite change and fever.  Respiratory: Negative for cough and shortness of breath.   Cardiovascular: Negative for  chest pain and palpitations.  Gastrointestinal: Positive for diarrhea. Negative for abdominal pain and blood in stool.  Genitourinary: Positive for frequency. Negative for dysuria and hematuria.       Urinary incontinence  Psychiatric/Behavioral: Negative for dysphoric mood and sleep disturbance. The patient is not nervous/anxious.     Immunization History  Administered Date(s) Administered  . Influenza, High Dose Seasonal PF 06/27/2016, 05/23/2017, 06/08/2018  . Moderna SARS-COVID-2 Vaccination 11/12/2019, 12/10/2019  . Pneumococcal Conjugate-13 01/04/2017  . Pneumococcal Polysaccharide-23 01/17/2018   Pertinent  Health Maintenance Due  Topic Date Due  . INFLUENZA VACCINE  08/21/2020 (Originally 03/22/2020)  . DEXA SCAN  Completed  . PNA vac Low Risk Adult  Completed   No flowsheet data found. Functional Status Survey:    Vitals:   07/02/20 1627  BP: 107/69  Pulse: 70  Resp: 18  Temp: 97.6 F (36.4 C)  SpO2: 96%  Weight: 225 lb 12.8 oz (102.4 kg)  Height: 5\' 1"  (1.549 m)   Body mass index is 42.66 kg/m. Physical Exam Vitals and nursing note reviewed.  Constitutional:  General: She is not in acute distress.    Appearance: Normal appearance. She is normal weight.  Cardiovascular:     Rate and Rhythm: Normal rate and regular rhythm.     Pulses: Normal pulses.     Heart sounds: Normal heart sounds. No murmur heard.   Pulmonary:     Effort: Pulmonary effort is normal. No respiratory distress.     Breath sounds: Normal breath sounds.  Abdominal:     General: There is no distension.     Palpations: Abdomen is soft.     Tenderness: There is no abdominal tenderness. There is no guarding.     Comments: Hyperactive bowel sounds  Skin:    General: Skin is warm and dry.     Capillary Refill: Capillary refill takes less than 2 seconds.  Neurological:     General: No focal deficit present.     Mental Status: She is alert and oriented to person, place, and time. Mental  status is at baseline.  Psychiatric:        Mood and Affect: Mood normal.        Behavior: Behavior normal.        Thought Content: Thought content normal.        Judgment: Judgment normal.     Labs reviewed: Recent Labs    02/15/20 0121 02/16/20 0431 04/18/20 0239 04/18/20 0239 04/19/20 0801 04/19/20 0801 04/20/20 2585 04/21/20 0727 06/01/20 1025 06/01/20 1025 06/08/20 1410 06/08/20 1410 06/16/20 0000 06/22/20 1320 06/29/20 0000  NA 138   < > 137   < > 137   < > 138   < > 135   < > 138  --  138 139 142  K 3.5   < > 4.8   < > 3.6   < > 3.4*   < > 3.4*   < > 3.0*   < > 2.3* 2.8* 3.2*  CL 102   < > 106   < > 104   < > 103   < > 102   < > 101   < > 98* 103 101  CO2 23   < > 24   < > 22   < > 23   < > 25   < > 31   < > 25* 29 28*  GLUCOSE 99   < > 89   < > 88   < > 90   < > 77  --  98  --   --  93  --   BUN 12   < > 10   < > 11   < > 10   < > 6*   < > 10  --  7 5* 8  CREATININE 0.82   < > 0.85   < > 0.77   < > 0.80   < > 0.58   < > 0.54  --  0.4* 0.59 0.3*  CALCIUM 9.0   < > 8.6*   < > 8.6*   < > 8.7*   < > 9.8   < > 9.1   < > 8.7 9.1 9.0  MG 1.9   < > 2.2  --  1.8  --  1.8  --   --   --   --   --   --   --   --   PHOS 3.0  --   --   --   --   --   --   --   --   --   --   --   --   --   --    < > =  values in this interval not displayed.   Recent Labs    06/01/20 1025 06/01/20 1025 06/08/20 1410 06/22/20 1320 06/29/20 0000  AST 29   < > 13* 26 13  ALT 38   < > 14 36 14  ALKPHOS 95  --  68 84  --   BILITOT 0.6  --  1.1 0.5  --   PROT 6.8  --  5.8* 6.0*  --   ALBUMIN 3.0*  --  2.8* 2.7*  --    < > = values in this interval not displayed.   Recent Labs    04/30/20 0000 06/01/20 1025 06/01/20 1025 06/08/20 1410 06/16/20 0000 06/22/20 1320 06/29/20 0000  WBC   < > 3.3*   < > 1.1* 13.2 13.1* 2.5  NEUTROABS  --  1.0*  --  0.4*  --  8.5*  --   HGB   < > 13.9   < > 11.7* 11.6* 11.9* 10.7*  HCT   < > 40.6   < > 34.5* 35* 35.9* 33*  MCV  --  92.9  --  91.3  --   94.0  --   PLT   < > 212   < > 114* 54* 344 156   < > = values in this interval not displayed.   Lab Results  Component Value Date   TSH 2.458 02/15/2020   Lab Results  Component Value Date   HGBA1C 5.7 (H) 02/15/2020   No results found for: CHOL, HDL, LDLCALC, LDLDIRECT, TRIG, CHOLHDL  Significant Diagnostic Results in last 30 days:  CT Chest W Contrast  Result Date: 06/19/2020 CLINICAL DATA:  Primary Cancer Type: Lung Imaging Indication: Assess response to therapy Interval therapy since last imaging? 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 Primary Tumor location: Right upper lobe. Surgeries: Right lower lobectomy 2000. Chemotherapy: Yes; Ongoing? Yes; Most recent administration: 06/01/2020 Immunotherapy? No Radiation therapy? No EXAM: CT CHEST WITH CONTRAST TECHNIQUE: Multidetector CT imaging of the chest was performed during intravenous contrast administration. CONTRAST:  12mL OMNIPAQUE IOHEXOL 300 MG/ML  SOLN COMPARISON:  03/10/2020 PET-CT.  CT chest 02/14/2020. FINDINGS: Cardiovascular: Aortic atherosclerosis. Left and right coronary artery calcifications and/or stents. Mild cardiomegaly. No pericardial effusion. Mediastinum/Nodes: Interval decrease in size of enlarged pretracheal lymph nodes, largest node measuring 1.4 x 1.2 cm, previously 2.5 x 2.3 cm (series 3, image 47). Thyroid gland, trachea, and esophagus demonstrate no significant findings. Lungs/Pleura: Status post right middle lobectomy. Interval decrease in size of a suprahilar nodule of the right upper lobe, measuring 1.7 x 1.2 cm, previously 2.1 x 1.6 cm (series 4, image 41). Interval decrease in size of nodular soft tissue involving the pleura at the right apex, measuring 1.7 x 1.4 cm, previously 2.8 x 2.1 cm (series 3, image 22). Interval decrease in irregular nodularity of the peripheral right upper lobe, largest component  approximately 6 mm, previously 1.4 cm (series 4, image 52). New small right pleural effusion. Upper Abdomen: No acute abnormality. Musculoskeletal: No chest wall mass or suspicious bone lesions identified. IMPRESSION: 1. Interval decrease in size of a suprahilar nodule of the right upper lobe and nodular soft tissue involving the pleura at the right apex. 2. Interval decrease in size of enlarged pretracheal lymph nodes. 3. Findings are consistent with treatment response. 4. Interval decrease in irregular nodularity of the peripheral right upper lobe. Given appearance this more likely reflects resolving nonspecific infection or inflammation. 5.  New small right pleural effusion, nonspecific. No obvious pleural nodularity or soft tissue. 6. Status post right middle lobectomy. 7. Coronary artery disease.  Aortic Atherosclerosis (ICD10-I70.0). Electronically Signed   By: Eddie Candle M.D.   On: 06/19/2020 11:36    Assessment/Plan 1. Urinary frequency - ongoing, nursing staff cannot state how many times she has urinated, neither can patient - will order U/A today - recommend increasing water intake  2. Hypokalemia - ongoing, suspect diarrhea has caused low potassium level, she was last given additional dose 06/29/2020 - will order 1 gatorade drink daily  - will order potassium chloride 40 meq PO for 2 doses - recheck BMP 07/06/2020  3. Diarrhea, unspecified type - ongoing, suspect due to chemo treatments - will order 2 doses of imodium 2 mg to help with loose stools  Family/ staff Communication: Plan discussed with patient and nursing facility.   Labs/tests ordered: BMP 07/06/2020

## 2020-07-02 NOTE — Progress Notes (Signed)
Location:    [facility] Room Number: 211/D Place of Service:  SNF [phone]) Provider:  Windell Moulding NP  [name], MD  Patient Care Team: [name], MD as PCP - General (Family Medicine) Lorretta Harp, MD as PCP - Cardiology (Cardiology) Gavin Pound, MD as Consulting Physician (Rheumatology) Valrie Hart, RN as Oncology Nurse Navigator  Extended Emergency Contact Information Primary Emergency Contact: [name] Mobile Phone: [phone] Relation: Daughter Secondary Emergency Contact: [name] Mobile Phone: [phone] Relation: Daughter Preferred language: Cleophus Molt Interpreter needed? No  Code Status: Full Code Goals of care: Advanced Directive information Advanced Directives 07/02/2020  Does Patient Have a Medical Advance Directive? Yes  Type of Advance Directive -  Does patient want to make changes to medical advance directive? No - Patient declined  Copy of Healthcare Power of Attorney in Chart? -  Would patient like information on creating a medical advance directive? -     Chief Complaint  Patient presents with   Acute Visit    Low Potassium    HPI:  Pt is a 79 y.o. female seen today for an acute visit for    Past Medical History:  Diagnosis Date   A-fib ([place])    Dysrhythmia    GERD (gastroesophageal reflux disease)    Hemorrhoids    Hyperlipidemia    Hypertension    Lung cancer ([place]) 2000   RLL   Obesity    Persistent atrial fibrillation ([facility])    Rheumatoid arthritis ([place])    Past Surgical History:  Procedure Laterality Date   BREAST EXCISIONAL BIOPSY Left    x 2   BREAST EXCISIONAL BIOPSY Right    BRONCHIAL BIOPSY  02/18/2020   Procedure: BRONCHIAL BIOPSIES;  Surgeon: Collene Gobble, MD;  Location: [facility];  Service: Cardiopulmonary;;   BRONCHIAL BRUSHINGS  02/18/2020   Procedure: BRONCHIAL BRUSHINGS;  Surgeon: Collene Gobble, MD;  Location: [place];  Service:  Cardiopulmonary;;   BRONCHIAL NEEDLE ASPIRATION BIOPSY  02/18/2020   Procedure: BRONCHIAL NEEDLE ASPIRATION BIOPSIES;  Surgeon: Collene Gobble, MD;  Location: [place];  Service: Cardiopulmonary;;   BRONCHIAL WASHINGS  02/18/2020   Procedure: BRONCHIAL WASHINGS;  Surgeon: Collene Gobble, MD;  Location: [place];  Service: Cardiopulmonary;;   CARDIOVERSION N/A 01/09/2018   Procedure: CARDIOVERSION;  Surgeon: Josue Hector, MD;  Location: [facility] ENDOSCOPY;  Service: Cardiovascular;  Laterality: N/A;   CARDIOVERSION N/A 03/01/2018   Procedure: CARDIOVERSION;  Surgeon: Sueanne Margarita, MD;  Location: [facility] ENDOSCOPY;  Service: Cardiovascular;  Laterality: N/A;   ELECTROMAGNETIC NAVIGATION BROCHOSCOPY N/A 02/18/2020   Procedure: ELECTROMAGNETIC NAVIGATION BRONCHOSCOPY;  Surgeon: Collene Gobble, MD;  Location: [facility] ENDOSCOPY;  Service: Cardiopulmonary;  Laterality: N/A;   KNEE SURGERY     LUNG LOBECTOMY Right 2000   RLL removed   VIDEO BRONCHOSCOPY N/A 02/18/2020   Procedure: VIDEO BRONCHOSCOPY WITH FLUORO;  Surgeon: Collene Gobble, MD;  Location: [place];  Service: Cardiopulmonary;  Laterality: N/A;   VIDEO BRONCHOSCOPY WITH ENDOBRONCHIAL ULTRASOUND N/A 04/22/2020   Procedure: VIDEO BRONCHOSCOPY WITH ENDOBRONCHIAL ULTRASOUND;  Surgeon: [name], MD;  Location: [facility] OR;  Service: Thoracic;  Laterality: N/A;    Allergies  Allergen Reactions   Morphine Anaphylaxis   Morphine And Related Other (See Comments)    "vitals go down", pass out   Sulfa Antibiotics Rash    Allergies as of 07/02/2020      Reactions   Morphine Anaphylaxis  Morphine And Related Other (See Comments)   "vitals go down", pass out   Sulfa Antibiotics Rash      Medication List       Accurate as of July 02, 2020  4:31 PM. If you have any questions, ask your nurse or doctor.        bisacodyl 10 MG suppository Commonly known as: DULCOLAX Place 10 mg rectally See admin instructions. If  not relieved by MOM give 10mg  Bisacodyl supp. Rectally for x1 dose in 24 hours prn constipation   BL MILK OF MAGNESIA PO Take 30 mLs by mouth See admin instructions. If no BM in 3 day, give 30 cc Milk of magnesium p.o. x 1 dose in 24 hours as needed   cyanocobalamin 1000 MCG tablet Take 1 tablet (1,000 mcg total) by mouth daily.   diclofenac Sodium 1 % Gel Commonly known as: Voltaren Apply 4 g topically 4 (four) times daily. To bilateral knees and ankles   gabapentin 100 MG capsule Commonly known as: NEURONTIN Take 100 mg by mouth at bedtime.   levocetirizine 5 MG tablet Commonly known as: XYZAL Take 5 mg by mouth daily.   metoprolol succinate 25 MG 24 hr tablet Commonly known as: TOPROL-XL Take 12.5 mg by mouth at bedtime.   MYRBETRIQ PO Take 25 mg by mouth daily.   OXYGEN Inhale 2 L into the lungs as needed (to maintain O2 saturation of 90%).   prochlorperazine 10 MG tablet Commonly known as: COMPAZINE Take 10 mg by mouth every 6 (six) hours as needed for nausea or vomiting.   RA SALINE ENEMA RE Place 1 Container rectally See admin instructions. If not relieved by bisacodyl supp. Give disposable saline enema rectally x1 dose 24 hours prn constipation   simvastatin 20 MG tablet Commonly known as: ZOCOR Take 20 mg by mouth at bedtime.   timolol 0.5 % ophthalmic solution Commonly known as: TIMOPTIC Place 1 drop into both eyes at bedtime.   warfarin 2.5 MG tablet Commonly known as: COUMADIN Take as directed by the anticoagulation clinic. If you are unsure how to take this medication, talk to your nurse or doctor. Original instructions: Take 2.5 mg by mouth daily.       Review of Systems  Immunization History  Administered Date(s) Administered   Influenza, High Dose Seasonal PF 06/27/2016, 05/23/2017, 06/08/2018   Moderna SARS-COVID-2 Vaccination 11/12/2019, 12/10/2019   Pneumococcal Conjugate-13 01/04/2017   Pneumococcal Polysaccharide-23 01/17/2018    Pertinent  Health Maintenance Due  Topic Date Due   INFLUENZA VACCINE  08/21/2020 (Originally 03/22/2020)   DEXA SCAN  Completed   PNA vac Low Risk Adult  Completed   No flowsheet data found. Functional Status Survey:    Vitals:   07/02/20 1627  BP: 107/69  Pulse: 70  Resp: 18  Temp: 97.6 F (36.4 C)  SpO2: 96%  Weight: 225 lb 12.8 oz (102.4 kg)  Height: 5\' 1"  (1.549 m)   Body mass index is 42.66 kg/m. Physical Exam  Labs reviewed: Recent Labs    02/15/20 0121 02/16/20 0431 04/18/20 0239 04/18/20 0239 04/19/20 0801 04/19/20 0801 04/20/20 0807 04/21/20 0727 06/01/20 1025 06/01/20 1025 06/08/20 1410 06/08/20 1410 06/16/20 0000 06/22/20 1320 06/29/20 0000  NA 138   < > 137   < > 137   < > 138   < > 135   < > 138  --  138 139 142  K 3.5   < > 4.8   < > 3.6   < >  3.4*   < > 3.4*   < > 3.0*   < > 2.3* 2.8* 3.2*  CL 102   < > 106   < > 104   < > 103   < > 102   < > 101   < > 98* 103 101  CO2 23   < > 24   < > 22   < > 23   < > 25   < > 31   < > 25* 29 28*  GLUCOSE 99   < > 89   < > 88   < > 90   < > 77  --  98  --   --  93  --   BUN 12   < > 10   < > 11   < > 10   < > 6*   < > 10  --  7 5* 8  CREATININE 0.82   < > 0.85   < > 0.77   < > 0.80   < > 0.58   < > 0.54  --  0.4* 0.59 0.3*  CALCIUM 9.0   < > 8.6*   < > 8.6*   < > 8.7*   < > 9.8   < > 9.1   < > 8.7 9.1 9.0  MG 1.9   < > 2.2  --  1.8  --  1.8  --   --   --   --   --   --   --   --   PHOS 3.0  --   --   --   --   --   --   --   --   --   --   --   --   --   --    < > = values in this interval not displayed.   Recent Labs    06/01/20 1025 06/01/20 1025 06/08/20 1410 06/22/20 1320 06/29/20 0000  AST 29   < > 13* 26 13  ALT 38   < > 14 36 14  ALKPHOS 95  --  68 84  --   BILITOT 0.6  --  1.1 0.5  --   PROT 6.8  --  5.8* 6.0*  --   ALBUMIN 3.0*  --  2.8* 2.7*  --    < > = values in this interval not displayed.   Recent Labs    04/30/20 0000 06/01/20 1025 06/01/20 1025 06/08/20 1410  06/16/20 0000 06/22/20 1320 06/29/20 0000  WBC   < > 3.3*   < > 1.1* 13.2 13.1* 2.5  NEUTROABS  --  1.0*  --  0.4*  --  8.5*  --   HGB   < > 13.9   < > 11.7* 11.6* 11.9* 10.7*  HCT   < > 40.6   < > 34.5* 35* 35.9* 33*  MCV  --  92.9  --  91.3  --  94.0  --   PLT   < > 212   < > 114* 54* 344 156   < > = values in this interval not displayed.   Lab Results  Component Value Date   TSH 2.458 02/15/2020   Lab Results  Component Value Date   HGBA1C 5.7 (H) 02/15/2020   No results found for: CHOL, HDL, LDLCALC, LDLDIRECT, TRIG, CHOLHDL  Significant Diagnostic Results in last 30 days:  CT Chest W Contrast  Result Date: 06/19/2020 CLINICAL  DATA:  Primary Cancer Type: Lung Imaging Indication: Assess response to therapy Interval therapy since last imaging? 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 Primary Tumor location: Right upper lobe. Surgeries: Right lower lobectomy 2000. Chemotherapy: Yes; Ongoing? Yes; Most recent administration: 06/01/2020 Immunotherapy? No Radiation therapy? No EXAM: CT CHEST WITH CONTRAST TECHNIQUE: Multidetector CT imaging of the chest was performed during intravenous contrast administration. CONTRAST:  39mL OMNIPAQUE IOHEXOL 300 MG/ML  SOLN COMPARISON:  03/10/2020 PET-CT.  CT chest 02/14/2020. FINDINGS: Cardiovascular: Aortic atherosclerosis. Left and right coronary artery calcifications and/or stents. Mild cardiomegaly. No pericardial effusion. Mediastinum/Nodes: Interval decrease in size of enlarged pretracheal lymph nodes, largest node measuring 1.4 x 1.2 cm, previously 2.5 x 2.3 cm (series 3, image 47). Thyroid gland, trachea, and esophagus demonstrate no significant findings. Lungs/Pleura: Status post right middle lobectomy. Interval decrease in size of a suprahilar nodule of the right upper lobe, measuring 1.7 x 1.2 cm, previously 2.1 x 1.6 cm (series 4, image 41). Interval  decrease in size of nodular soft tissue involving the pleura at the right apex, measuring 1.7 x 1.4 cm, previously 2.8 x 2.1 cm (series 3, image 22). Interval decrease in irregular nodularity of the peripheral right upper lobe, largest component approximately 6 mm, previously 1.4 cm (series 4, image 52). New small right pleural effusion. Upper Abdomen: No acute abnormality. Musculoskeletal: No chest wall mass or suspicious bone lesions identified. IMPRESSION: 1. Interval decrease in size of a suprahilar nodule of the right upper lobe and nodular soft tissue involving the pleura at the right apex. 2. Interval decrease in size of enlarged pretracheal lymph nodes. 3. Findings are consistent with treatment response. 4. Interval decrease in irregular nodularity of the peripheral right upper lobe. Given appearance this more likely reflects resolving nonspecific infection or inflammation. 5. New small right pleural effusion, nonspecific. No obvious pleural nodularity or soft tissue. 6. Status post right middle lobectomy. 7. Coronary artery disease.  Aortic Atherosclerosis (ICD10-I70.0). Electronically Signed   By: Eddie Candle M.D.   On: 06/19/2020 11:36    Assessment/Plan There are no diagnoses linked to this encounter.   Family/ staff Communication:   Labs/tests ordered:

## 2020-07-06 ENCOUNTER — Other Ambulatory Visit: Payer: Self-pay

## 2020-07-06 ENCOUNTER — Other Ambulatory Visit: Payer: Medicare HMO

## 2020-07-06 ENCOUNTER — Encounter (HOSPITAL_COMMUNITY): Payer: Self-pay

## 2020-07-06 ENCOUNTER — Emergency Department (HOSPITAL_COMMUNITY)
Admission: EM | Admit: 2020-07-06 | Discharge: 2020-07-07 | Disposition: A | Discharge status: Home / Self Care | Payer: Medicare HMO | Attending: Emergency Medicine | Admitting: Emergency Medicine

## 2020-07-06 DIAGNOSIS — Z87891 Personal history of nicotine dependence: Secondary | ICD-10-CM | POA: Insufficient documentation

## 2020-07-06 DIAGNOSIS — D696 Thrombocytopenia, unspecified: Secondary | ICD-10-CM

## 2020-07-06 DIAGNOSIS — R799 Abnormal finding of blood chemistry, unspecified: Secondary | ICD-10-CM | POA: Diagnosis present

## 2020-07-06 DIAGNOSIS — C3431 Malignant neoplasm of lower lobe, right bronchus or lung: Secondary | ICD-10-CM | POA: Insufficient documentation

## 2020-07-06 DIAGNOSIS — I10 Essential (primary) hypertension: Secondary | ICD-10-CM | POA: Diagnosis not present

## 2020-07-06 LAB — CBC WITH DIFFERENTIAL/PLATELET
Abs Immature Granulocytes: 0.2 10*3/uL — ABNORMAL HIGH (ref 0.00–0.07)
Band Neutrophils: 1 %
Basophils Absolute: 0 10*3/uL (ref 0.0–0.1)
Basophils Relative: 0 %
Eosinophils Absolute: 0 10*3/uL (ref 0.0–0.5)
Eosinophils Relative: 0 %
HCT: 28.2 % — ABNORMAL LOW (ref 36.0–46.0)
Hemoglobin: 9.5 g/dL — ABNORMAL LOW (ref 12.0–15.0)
Lymphocytes Relative: 29 %
Lymphs Abs: 5.9 10*3/uL — ABNORMAL HIGH (ref 0.7–4.0)
MCH: 31.4 pg (ref 26.0–34.0)
MCHC: 33.7 g/dL (ref 30.0–36.0)
MCV: 93.1 fL (ref 80.0–100.0)
Monocytes Absolute: 1.8 10*3/uL — ABNORMAL HIGH (ref 0.1–1.0)
Monocytes Relative: 9 %
Myelocytes: 1 %
Neutro Abs: 12.4 10*3/uL — ABNORMAL HIGH (ref 1.7–7.7)
Neutrophils Relative %: 60 %
Platelets: 36 10*3/uL — ABNORMAL LOW (ref 150–400)
RBC: 3.03 MIL/uL — ABNORMAL LOW (ref 3.87–5.11)
RDW: 15.5 % (ref 11.5–15.5)
WBC: 20.4 10*3/uL — ABNORMAL HIGH (ref 4.0–10.5)
nRBC: 0.3 % — ABNORMAL HIGH (ref 0.0–0.2)
nRBC: 1 /100 WBC — ABNORMAL HIGH

## 2020-07-06 LAB — BASIC METABOLIC PANEL
Anion gap: 14 (ref 5–15)
BUN: 7 mg/dL — ABNORMAL LOW (ref 8–23)
CO2: 24 mmol/L (ref 22–32)
Calcium: 8.5 mg/dL — ABNORMAL LOW (ref 8.9–10.3)
Chloride: 98 mmol/L (ref 98–111)
Creatinine, Ser: 0.58 mg/dL (ref 0.44–1.00)
GFR, Estimated: >60 mL/min (ref >60)
Glucose, Bld: 88 mg/dL (ref 70–99)
Potassium: 2.9 mmol/L — ABNORMAL LOW (ref 3.5–5.1)
Sodium: 136 mmol/L (ref 135–145)

## 2020-07-06 LAB — PROTIME-INR
INR: 2.1 — ABNORMAL HIGH (ref 0.8–1.2)
Prothrombin Time: 22.5 seconds — ABNORMAL HIGH (ref 11.4–15.2)

## 2020-07-06 NOTE — Discharge Instructions (Addendum)
You were evaluated in the Emergency Department and after careful evaluation, we did not find any emergent condition requiring admission or further testing in the hospital.  Your exam/testing today is overall reassuring.  Your platelet count seems improved.  We recommend close follow-up with your oncologist.  Please return to the Emergency Department if you experience any worsening of your condition such as bleeding or fever.   Thank you for allowing [place] to be a part of your care.

## 2020-07-06 NOTE — ED Triage Notes (Signed)
Platelet count was 19 at 6AM today, pt has active UTI, history of lung cancer.

## 2020-07-06 NOTE — ED Provider Notes (Signed)
[facility] Emergency Department Provider Note MRN:  [id number]  Arrival date & time: 07/06/20     Chief Complaint   abnormal labs   History of Present Illness   Leah Velasquez is a 79 y.o. year-old female with a history of lung cancer presenting to the ED with chief complaint of abnormal labs.  Patient is undergoing recent chemotherapy for lung cancer and she has been told that her blood levels are low, sent here for evaluation.  She denies headache or vision change, no chest pain or shortness of breath, no abdominal pain, no bleeding, no black stool, largely without complaints.  Having some burning with urination and has been diagnosed with a UTI.  Review of Systems  A complete 10 system review of systems was obtained and all systems are negative except as noted in the HPI and PMH.   Patient's Health History    Past Medical History:  Diagnosis Date  . A-fib ([place])   . Dysrhythmia   . GERD (gastroesophageal reflux disease)   . Hemorrhoids   . Hyperlipidemia   . Hypertension   . Lung cancer ([place]) 2000   RLL  . Obesity   . Persistent atrial fibrillation ([place])   . Rheumatoid arthritis [facility])     Past Surgical History:  Procedure Laterality Date  . BREAST EXCISIONAL BIOPSY Left    x 2  . BREAST EXCISIONAL BIOPSY Right   . BRONCHIAL BIOPSY  02/18/2020   Procedure: BRONCHIAL BIOPSIES;  Surgeon: Collene Gobble, MD;  Location: [facility] ENDOSCOPY;  Service: Cardiopulmonary;;  . BRONCHIAL BRUSHINGS  02/18/2020   Procedure: BRONCHIAL BRUSHINGS;  Surgeon: Collene Gobble, MD;  Location: [place];  Service: Cardiopulmonary;;  . BRONCHIAL NEEDLE ASPIRATION BIOPSY  02/18/2020   Procedure: BRONCHIAL NEEDLE ASPIRATION BIOPSIES;  Surgeon: Collene Gobble, MD;  Location: [place];  Service: Cardiopulmonary;;  . BRONCHIAL WASHINGS  02/18/2020   Procedure: BRONCHIAL WASHINGS;  Surgeon: Collene Gobble, MD;  Location: [place];  Service: Cardiopulmonary;;  .  CARDIOVERSION N/A 01/09/2018   Procedure: CARDIOVERSION;  Surgeon: Josue Hector, MD;  Location: [facility] ENDOSCOPY;  Service: Cardiovascular;  Laterality: N/A;  . CARDIOVERSION N/A 03/01/2018   Procedure: CARDIOVERSION;  Surgeon: Sueanne Margarita, MD;  Location: [facility] ENDOSCOPY;  Service: Cardiovascular;  Laterality: N/A;  . ELECTROMAGNETIC NAVIGATION BROCHOSCOPY N/A 02/18/2020   Procedure: ELECTROMAGNETIC NAVIGATION BRONCHOSCOPY;  Surgeon: Collene Gobble, MD;  Location: [place];  Service: Cardiopulmonary;  Laterality: N/A;  . KNEE SURGERY    . LUNG LOBECTOMY Right 2000   RLL removed  . VIDEO BRONCHOSCOPY N/A 02/18/2020   Procedure: VIDEO BRONCHOSCOPY WITH FLUORO;  Surgeon: Collene Gobble, MD;  Location: [facility] ENDOSCOPY;  Service: Cardiopulmonary;  Laterality: N/A;  . VIDEO BRONCHOSCOPY WITH ENDOBRONCHIAL ULTRASOUND N/A 04/22/2020   Procedure: VIDEO BRONCHOSCOPY WITH ENDOBRONCHIAL ULTRASOUND;  Surgeon: [name], MD;  Location: [place];  Service: Thoracic;  Laterality: N/A;    No family history on file.  Social History   Socioeconomic History  . Marital status: Married    Spouse name: Not on file  . Number of children: Not on file  . Years of education: Not on file  . Highest education level: Not on file  Occupational History  . Not on file  Tobacco Use  . Smoking status: Former Research scientist (life sciences)  . Smokeless tobacco: Never Used  Vaping Use  . Vaping Use: Never used  Substance and Sexual Activity  . Alcohol use: No  . Drug  use: No  . Sexual activity: Not on file  Other Topics Concern  . Not on file  Social History Narrative   ** Merged History Encounter **       ** Merged History Encounter **       Social Determinants of Health   Financial Resource Strain:   . Difficulty of Paying Living Expenses: Not on file  Food Insecurity:   . Worried About Charity fundraiser in the Last Year: Not on file  . Ran Out of Food in the Last Year: Not on file  Transportation Needs:   . Lack of  Transportation (Medical): Not on file  . Lack of Transportation (Non-Medical): Not on file  Physical Activity:   . Days of Exercise per Week: Not on file  . Minutes of Exercise per Session: Not on file  Stress:   . Feeling of Stress : Not on file  Social Connections:   . Frequency of Communication with Friends and Family: Not on file  . Frequency of Social Gatherings with Friends and Family: Not on file  . Attends Religious Services: Not on file  . Active Member of Clubs or Organizations: Not on file  . Attends Archivist Meetings: Not on file  . Marital Status: Not on file  Intimate Partner Violence:   . Fear of Current or Ex-Partner: Not on file  . Emotionally Abused: Not on file  . Physically Abused: Not on file  . Sexually Abused: Not on file     Physical Exam   Vitals:   07/06/20 2315 07/06/20 2330  BP: 134/74 (!) 147/84  Pulse: 76 82  Resp: (!) 28 (!) 28  Temp:    SpO2: 95% 94%    CONSTITUTIONAL: Well-appearing, NAD NEURO:  Alert and oriented x 3, no focal deficits EYES:  eyes equal and reactive ENT/NECK:  no LAD, no JVD CARDIO: Regular rate, well-perfused, normal S1 and S2 PULM:  CTAB no wheezing or rhonchi GI/GU:  normal bowel sounds, non-distended, non-tender MSK/SPINE:  No gross deformities, no edema SKIN:  no rash, atraumatic PSYCH:  Appropriate speech and behavior  *Additional and/or pertinent findings included in MDM below  Diagnostic and Interventional Summary    EKG Interpretation  Date/Time:    Ventricular Rate:    PR Interval:    QRS Duration:   QT Interval:    QTC Calculation:   R Axis:     Text Interpretation:        Labs Reviewed  CBC WITH DIFFERENTIAL/PLATELET - Abnormal; Notable for the following components:      Result Value   WBC 20.4 (*)    RBC 3.03 (*)    Hemoglobin 9.5 (*)    HCT 28.2 (*)    Platelets 36 (*)    nRBC 0.3 (*)    Neutro Abs 12.4 (*)    Lymphs Abs 5.9 (*)    Monocytes Absolute 1.8 (*)    nRBC 1  (*)    Abs Immature Granulocytes 0.20 (*)    All other components within normal limits  BASIC METABOLIC PANEL - Abnormal; Notable for the following components:   Potassium 2.9 (*)    BUN 7 (*)    Calcium 8.5 (*)    All other components within normal limits  PROTIME-INR - Abnormal; Notable for the following components:   Prothrombin Time 22.5 (*)    INR 2.1 (*)    All other components within normal limits  TYPE AND SCREEN  No orders to display    Medications - No data to display   Procedures  /  Critical Care Procedures  ED Course and Medical Decision Making  I have reviewed the triage vital signs, the nursing notes, and pertinent available records from the EMR.  Listed above are laboratory and imaging tests that I personally ordered, reviewed, and interpreted and then considered in my medical decision making (see below for details).  Thrombocytopenia in the setting of recent chemotherapy.  Receiving etoposide as well as filgrastim.  Largely without symptoms.  Normal vital signs.  Will recheck platelets to see if she needs a transfusion.     Labs reveal leukocytosis which is expected given the recent filgrastim.  H&H is at or near baseline.  Platelets are 36.  Much improved from the reported 19.  Based on this and her lack of symptoms, no bleeding, no fever, there is no indication for transfusion at this time.  Patient feels well and is appropriate for discharge with close oncology follow-up.  Strict return precautions for bleeding or fever.  Barth Kirks. [place]onia Small, [place] [email or web address]  Final Clinical Impressions(s) / ED Diagnoses     ICD-10-CM   1. Thrombocytopenia ([place])  D69.6     ED Discharge Orders    None       Discharge Instructions Discussed with and Provided to Patient:     Discharge Instructions     You were evaluated in the Emergency Department and after careful evaluation, we did not find any  emergent condition requiring admission or further testing in the hospital.  Your exam/testing today is overall reassuring.  Your platelet count seems improved.  We recommend close follow-up with your oncologist.  Please return to the Emergency Department if you experience any worsening of your condition such as bleeding or fever.   Thank you for allowing [place] to be a part of your care.       Maudie Flakes, MD 07/06/20 2342

## 2020-07-06 NOTE — Progress Notes (Signed)
Cardiology Clinic Note   Patient Name: Leah CAHOON Date of Encounter: 07/08/2020  Primary Care Provider:  [name], MD Primary Cardiologist:  Quay Burow, MD  Patient Profile    Leah Velasquez 79 year old female presents the clinic today for follow-up evaluation of her persistent atrial fibrillation and essential hypertension.  Past Medical History    Past Medical History:  Diagnosis Date  . A-fib ([place])   . Dysrhythmia   . GERD (gastroesophageal reflux disease)   . Hemorrhoids   . Hyperlipidemia   . Hypertension   . Lung cancer ([place]) 2000   RLL  . Obesity   . Persistent atrial fibrillation ([place])   . Rheumatoid arthritis [facility])    Past Surgical History:  Procedure Laterality Date  . BREAST EXCISIONAL BIOPSY Left    x 2  . BREAST EXCISIONAL BIOPSY Right   . BRONCHIAL BIOPSY  02/18/2020   Procedure: BRONCHIAL BIOPSIES;  Surgeon: Collene Gobble, MD;  Location: [facility] ENDOSCOPY;  Service: Cardiopulmonary;;  . BRONCHIAL BRUSHINGS  02/18/2020   Procedure: BRONCHIAL BRUSHINGS;  Surgeon: Collene Gobble, MD;  Location: [place];  Service: Cardiopulmonary;;  . BRONCHIAL NEEDLE ASPIRATION BIOPSY  02/18/2020   Procedure: BRONCHIAL NEEDLE ASPIRATION BIOPSIES;  Surgeon: Collene Gobble, MD;  Location: [place];  Service: Cardiopulmonary;;  . BRONCHIAL WASHINGS  02/18/2020   Procedure: BRONCHIAL WASHINGS;  Surgeon: Collene Gobble, MD;  Location: [place];  Service: Cardiopulmonary;;  . CARDIOVERSION N/A 01/09/2018   Procedure: CARDIOVERSION;  Surgeon: Josue Hector, MD;  Location: [facility] ENDOSCOPY;  Service: Cardiovascular;  Laterality: N/A;  . CARDIOVERSION N/A 03/01/2018   Procedure: CARDIOVERSION;  Surgeon: Sueanne Margarita, MD;  Location: [facility] ENDOSCOPY;  Service: Cardiovascular;  Laterality: N/A;  . ELECTROMAGNETIC NAVIGATION BROCHOSCOPY N/A 02/18/2020   Procedure: ELECTROMAGNETIC NAVIGATION BRONCHOSCOPY;  Surgeon: Collene Gobble, MD;  Location: [place];   Service: Cardiopulmonary;  Laterality: N/A;  . KNEE SURGERY    . LUNG LOBECTOMY Right 2000   RLL removed  . VIDEO BRONCHOSCOPY N/A 02/18/2020   Procedure: VIDEO BRONCHOSCOPY WITH FLUORO;  Surgeon: Collene Gobble, MD;  Location: [facility] ENDOSCOPY;  Service: Cardiopulmonary;  Laterality: N/A;  . VIDEO BRONCHOSCOPY WITH ENDOBRONCHIAL ULTRASOUND N/A 04/22/2020   Procedure: VIDEO BRONCHOSCOPY WITH ENDOBRONCHIAL ULTRASOUND;  Surgeon: [name], MD;  Location: [facility] OR;  Service: Thoracic;  Laterality: N/A;    Allergies  Allergies  Allergen Reactions  . Morphine Anaphylaxis  . Morphine And Related Other (See Comments)    "vitals go down", pass out  . Sulfa Antibiotics Rash    History of Present Illness    Ms. 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  She was initially seen by Dr. Gwenlyn Found for evaluation of her A. fib.  She is a retired Art therapist.  She has a remote history of smoking.  She was hospitalized 3/19 for 4 days due to flu a, pneumonia/COPD.  She developed atrial fibrillation during her hospitalization was placed on carvedilol and Eliquis.  She is on losartan for her HTN.  She underwent DCCV by Dr. Radford Pax 7/19 and was back in A. fib when she saw Roderic Palau 04/18/2018.  She was transitioned from Eliquis to warfarin due to financial constraints.  She was last seen by Dr. Gwenlyn Found on 01/01/2019.  During that time she remained stable.  Her atrial fibrillation  was rate controlled.  She remained asymptomatic.  She denied chest pain, shortness of breath, and had lost 40 pounds from diuretics, diet modification, and sodium restriction.  Recently seen and was a long ED 07/06/2020 presented to the emergency department with complaint of abnormal labs.  She is undergoing chemotherapy for lung cancer.  She was  told that her blood levels were low and was sent to the emergency department for further evaluation.  She denied headache, vision changes, chest pain, shortness of breath, abdominal pain, bleeding, unusual bleeding, and was without complaints.  She was having some burning with urination and was diagnosed with UTI.  Hemoglobin 9.5, platelets 36, WBC 20.4 potassium 2.9, BUN 7.  Vital signs normal.  She was discharged in stable condition and instructed to follow closely with oncology.  She presents the clinic today for follow-up evaluation states she feels wells today. Taking supplemental potassium at [facility]. Taking PT at [facility].  Husband states that she will resume her cancer treatment next week for her lung cancer.  She states that she has had several urinary tract infections and is on a course of antibiotics.  She denies burning urination urgency/frequency.  EKG today shows atrial fibrillation with RVR.  She states that she does not have any symptoms of palpitations or increased shortness of breath.  Upon rechecking pulse is 92 bpm.  I will have her continue to increase her physical activity and continue physical therapy.  We will continue her current medication regimen and have her follow-up in 12 months.  Today she denies chest pain, shortness of breath, lower extremity edema, fatigue, palpitations, melena, hematuria, hemoptysis, diaphoresis, weakness, presyncope, syncope, orthopnea, and PND.   Home Medications    Prior to Admission medications   Medication Sig Start Date End Date Taking? Authorizing Provider  bisacodyl (DULCOLAX) 10 MG suppository Place 10 mg rectally See admin instructions. If not relieved by MOM give 10mg  Bisacodyl supp. Rectally for x1 dose in 24 hours prn constipation     [provider]  diclofenac Sodium (VOLTAREN) 1 % GEL Apply 4 g topically 4 (four) times daily. To bilateral knees and ankles 04/24/20   Reed, Tiffany L, DO  gabapentin (NEURONTIN) 100 MG capsule  Take 100 mg by mouth at bedtime.    [provider]  levocetirizine (XYZAL) 5 MG tablet Take 5 mg by mouth daily.  01/28/20   [provider]  Magnesium Hydroxide (BL MILK OF MAGNESIA PO) Take 30 mLs by mouth See admin instructions. If no BM in 3 day, give 30 cc Milk of magnesium p.o. x 1 dose in 24 hours as needed     [provider]  metoprolol succinate (TOPROL-XL) 25 MG 24 hr tablet Take 12.5 mg by mouth at bedtime.  12/18/19   [provider]  Mirabegron (MYRBETRIQ PO) Take 25 mg by mouth daily.    [provider]  OXYGEN Inhale 2 L into the lungs as needed (to maintain O2 saturation of 90%).    [provider]  prochlorperazine (COMPAZINE) 10 MG tablet Take 10 mg by mouth every 6 (six) hours as needed for nausea or vomiting.    [provider]  simvastatin (ZOCOR) 20 MG tablet Take 20 mg by mouth at bedtime.    [provider]  Sodium Phosphates (RA SALINE ENEMA RE) Place 1 Container rectally See admin instructions. If not relieved by bisacodyl supp. Give disposable saline enema rectally x1 dose 24 hours prn constipation  [provider]  timolol (TIMOPTIC) 0.5 % ophthalmic solution Place 1 drop into both eyes at bedtime.  11/03/19   [provider]  vitamin B-12 1000 MCG tablet Take 1 tablet (1,000 mcg total) by mouth daily. 02/20/20   Andrew Au, MD  warfarin (COUMADIN) 2.5 MG tablet Take 2.5 mg by mouth daily.    [provider]    Family History    No family history on file. has no family status information on file.   Social History    Social History   Socioeconomic History  . Marital status: Married    Spouse name: Not on file  . Number of children: Not on file  . Years of education: Not on file  . Highest education level: Not on file  Occupational History  . Not on file  Tobacco Use  . Smoking status: Former Research scientist (life sciences)  . Smokeless tobacco: Never Used  Vaping Use  . Vaping  Use: Never used  Substance and Sexual Activity  . Alcohol use: No  . Drug use: No  . Sexual activity: Not on file  Other Topics Concern  . Not on file  Social History Narrative   ** Merged History Encounter **       ** Merged History Encounter **       Social Determinants of Health   Financial Resource Strain:   . Difficulty of Paying Living Expenses: Not on file  Food Insecurity:   . Worried About Charity fundraiser in the Last Year: Not on file  . Ran Out of Food in the Last Year: Not on file  Transportation Needs:   . Lack of Transportation (Medical): Not on file  . Lack of Transportation (Non-Medical): Not on file  Physical Activity:   . Days of Exercise per Week: Not on file  . Minutes of Exercise per Session: Not on file  Stress:   . Feeling of Stress : Not on file  Social Connections:   . Frequency of Communication with Friends and Family: Not on file  . Frequency of Social Gatherings with Friends and Family: Not on file  . Attends Religious Services: Not on file  . Active Member of Clubs or Organizations: Not on file  . Attends Archivist Meetings: Not on file  . Marital Status: Not on file  Intimate Partner Violence:   . Fear of Current or Ex-Partner: Not on file  . Emotionally Abused: Not on file  . Physically Abused: Not on file  . Sexually Abused: Not on file     Review of Systems    General:  No chills, fever, night sweats or weight changes.  Cardiovascular:  No chest pain, dyspnea on exertion, edema, orthopnea, palpitations, paroxysmal nocturnal dyspnea. Dermatological: No rash, lesions/masses Respiratory: No cough, dyspnea Urologic: No hematuria, dysuria Abdominal:   No nausea, vomiting, diarrhea, bright red blood per rectum, melena, or hematemesis Neurologic:  No visual changes, wkns, changes in mental status. All other systems reviewed and are otherwise negative except as noted above.  Physical Exam    VS:  BP 122/73   Pulse 92   Ht  5\' 1"  (1.549 m)   SpO2 95%   BMI 37.49 kg/m  , BMI Body mass index is 37.49 kg/m. GEN: Well nourished, well developed, in no acute distress. HEENT: normal. Neck: Supple, no JVD, carotid bruits, or masses. Cardiac: atrial fibrillation, no murmurs, rubs, or gallops. No clubbing, cyanosis, edema.  Radials/DP/PT 2+ and equal bilaterally.  Respiratory:  Respirations regular and unlabored, clear to auscultation bilaterally. GI: Soft, nontender, nondistended, BS + x 4. MS: no deformity or atrophy. Skin: warm and dry, no rash. Neuro:  Strength and sensation are intact. Psych: Normal affect.  Accessory Clinical Findings    Recent Labs: 02/15/2020: TSH 2.458 04/20/2020: Magnesium 1.8 04/22/2020: B Natriuretic Peptide 216.6 06/29/2020: ALT 14 07/06/2020: BUN 7; Creatinine, Ser 0.58; Hemoglobin 9.5; Platelets 36; Potassium 2.9; Sodium 136   Recent Lipid Panel No results found for: CHOL, TRIG, HDL, CHOLHDL, VLDL, LDLCALC, LDLDIRECT  ECG personally reviewed by me today-atrial fibrillation with rapid ventricular response nonspecific T wave abnormality possible digitalis effect 109 bpm  Echocardiogram 10/22/2017 Study Conclusions   - Left ventricle: The cavity size was normal. There was mild  concentric hypertrophy. Systolic function was normal. The  estimated ejection fraction was in the range of 60% to 65%. Wall  motion was normal; there were no regional wall motion  abnormalities.  - Aortic valve: There was no regurgitation.  - Mitral valve: There was trivial regurgitation.  - Right ventricle: Systolic function was normal.  - Right atrium: The atrium was normal in size.  - Tricuspid valve: There was no regurgitation.  - Pulmonary arteries: Systolic pressure could not be accurately  estimated.  - Inferior vena cava: The vessel was normal in size.  - Pericardium, extracardiac: There was no pericardial effusion.   Assessment & Plan   1.  Persistent atrial fibrillation-EKG today  shows a. fib.  Underwent DCCV and had reverted back to atrial fibrillation by her follow-up appointment with the A. fib clinic.  Rate control was chosen as a good option for continued therapy.  Her Eliquis was transitioned to Coumadin due to financial constraints. Continue Coumadin, metoprolol Heart healthy low-sodium diet-salty 6 given Increase physical activity as tolerated  Essential hypertension-BP today 122/73.  Continues to be well controlled at home 130s over 70s. Continue metoprolol, losartan Heart healthy low-sodium diet-salty 6 given Increase physical activity as tolerated   Disposition: Follow-up with Dr. Gwenlyn Found or me in 12 months.  Jossie Ng. Loreli Debruler NP-C    07/08/2020, 3:25 PM [facility] [place] [address] Office [phone] Fax [phone]  Notice: This dictation was prepared with Dragon dictation along with smaller phrase technology. Any transcriptional errors that result from this process are unintentional and may not be corrected upon review.

## 2020-07-07 LAB — TYPE AND SCREEN
ABO/RH(D): AB POS
Antibody Screen: NEGATIVE

## 2020-07-07 NOTE — ED Notes (Signed)
Called [facility] to arrange transport back to [facility].

## 2020-07-07 NOTE — ED Notes (Signed)
Called report to receiving nurse at [facility].

## 2020-07-07 NOTE — ED Notes (Signed)
This nurse contacted pt's spouse to update him on the plan of care.

## 2020-07-08 ENCOUNTER — Ambulatory Visit (INDEPENDENT_AMBULATORY_CARE_PROVIDER_SITE_OTHER): Payer: Medicare HMO | Admitting: General Practice

## 2020-07-08 ENCOUNTER — Encounter: Payer: Self-pay | Admitting: General Practice

## 2020-07-08 VITALS — BP 122/73 | HR 92 | Ht 61.0 in

## 2020-07-08 DIAGNOSIS — N3281 Overactive bladder: Secondary | ICD-10-CM

## 2020-07-08 DIAGNOSIS — I4821 Permanent atrial fibrillation: Secondary | ICD-10-CM | POA: Diagnosis not present

## 2020-07-08 DIAGNOSIS — N390 Urinary tract infection, site not specified: Secondary | ICD-10-CM | POA: Diagnosis not present

## 2020-07-08 DIAGNOSIS — I10 Essential (primary) hypertension: Secondary | ICD-10-CM | POA: Diagnosis not present

## 2020-07-08 NOTE — Patient Instructions (Signed)
Medication Instructions:  The current medical regimen is effective;  continue present plan and medications as directed. Please refer to the Current Medication list given to you today.  *If you need a refill on your cardiac medications before your next appointment, please call your pharmacy*  Lab Work:   Testing/Procedures:  NONE    NONE  Special Instructions PLEASE READ AND FOLLOW SALTY 6-ATTACHED-1,800mg  daily  PLEASE INCREASE PHYSICAL ACTIVITY AS TOLERATED  Follow-Up: Your next appointment:  12 month(s) In Person with Quay Burow, MD OR IF UNAVAILABLE JESSE CLEAVER, FNP-C Please call our office 2 months in advance to schedule this appointment   At [facility], you and your health needs are our priority.  As part of our continuing mission to provide you with exceptional heart care, we have created designated Provider Care Teams.  These Care Teams include your primary Cardiologist (physician) and Advanced Practice Providers (APPs -  Physician Assistants and Nurse Practitioners) who all work together to provide you with the care you need, when you need it.            6 SALTY THINGS TO AVOID     1,800MG  DAILY

## 2020-07-09 LAB — BASIC METABOLIC PANEL
BUN: 8 (ref 4–21)
CO2: 22 (ref 13–22)
Chloride: 103 (ref 99–108)
Creatinine: 0.5 (ref 0.5–1.1)
Glucose: 93
Potassium: 4.4 (ref 3.4–5.3)
Sodium: 138 (ref 137–147)

## 2020-07-09 LAB — COMPREHENSIVE METABOLIC PANEL
Calcium: 9.1 (ref 8.7–10.7)
GFR calc Af Amer: >90
GFR calc non Af Amer: >90

## 2020-07-13 ENCOUNTER — Encounter: Payer: Self-pay | Admitting: Orthopedic Surgery

## 2020-07-13 ENCOUNTER — Inpatient Hospital Stay (HOSPITAL_BASED_OUTPATIENT_CLINIC_OR_DEPARTMENT_OTHER): Payer: Medicare HMO | Admitting: Internal Medicine

## 2020-07-13 ENCOUNTER — Other Ambulatory Visit: Payer: Self-pay

## 2020-07-13 ENCOUNTER — Inpatient Hospital Stay: Payer: Medicare HMO

## 2020-07-13 ENCOUNTER — Non-Acute Institutional Stay (SKILLED_NURSING_FACILITY): Payer: Medicare HMO | Admitting: Orthopedic Surgery

## 2020-07-13 ENCOUNTER — Encounter: Payer: Self-pay | Admitting: Internal Medicine

## 2020-07-13 VITALS — BP 112/63 | HR 83 | Temp 97.8°F | Resp 18 | Ht 61.0 in | Wt 205.0 lb

## 2020-07-13 DIAGNOSIS — Z7901 Long term (current) use of anticoagulants: Secondary | ICD-10-CM

## 2020-07-13 DIAGNOSIS — C3411 Malignant neoplasm of upper lobe, right bronchus or lung: Secondary | ICD-10-CM

## 2020-07-13 DIAGNOSIS — C3491 Malignant neoplasm of unspecified part of right bronchus or lung: Secondary | ICD-10-CM

## 2020-07-13 DIAGNOSIS — D696 Thrombocytopenia, unspecified: Secondary | ICD-10-CM

## 2020-07-13 DIAGNOSIS — Z5111 Encounter for antineoplastic chemotherapy: Secondary | ICD-10-CM | POA: Diagnosis not present

## 2020-07-13 DIAGNOSIS — C3490 Malignant neoplasm of unspecified part of unspecified bronchus or lung: Secondary | ICD-10-CM

## 2020-07-13 DIAGNOSIS — I4819 Other persistent atrial fibrillation: Secondary | ICD-10-CM | POA: Diagnosis not present

## 2020-07-13 DIAGNOSIS — R531 Weakness: Secondary | ICD-10-CM

## 2020-07-13 DIAGNOSIS — F5101 Primary insomnia: Secondary | ICD-10-CM

## 2020-07-13 DIAGNOSIS — I10 Essential (primary) hypertension: Secondary | ICD-10-CM | POA: Diagnosis not present

## 2020-07-13 DIAGNOSIS — R197 Diarrhea, unspecified: Secondary | ICD-10-CM

## 2020-07-13 DIAGNOSIS — E782 Mixed hyperlipidemia: Secondary | ICD-10-CM

## 2020-07-13 LAB — COMPREHENSIVE METABOLIC PANEL
Albumin: 2.6 — AB (ref 3.5–5.0)
Calcium: 8.6 — AB (ref 8.7–10.7)
GFR calc Af Amer: >90
GFR calc non Af Amer: >90
Globulin: 2.2

## 2020-07-13 LAB — CMP (CANCER CENTER ONLY)
ALT: 12 U/L (ref 0–44)
AST: 18 U/L (ref 15–41)
Albumin: 2.7 g/dL — ABNORMAL LOW (ref 3.5–5.0)
Alkaline Phosphatase: 108 U/L (ref 38–126)
Anion gap: 8 (ref 5–15)
BUN: 6 mg/dL — ABNORMAL LOW (ref 8–23)
CO2: 26 mmol/L (ref 22–32)
Calcium: 8.6 mg/dL — ABNORMAL LOW (ref 8.9–10.3)
Chloride: 103 mmol/L (ref 98–111)
Creatinine: 0.59 mg/dL (ref 0.44–1.00)
GFR, Estimated: >60 mL/min (ref >60)
Glucose, Bld: 108 mg/dL — ABNORMAL HIGH (ref 70–99)
Potassium: 3.3 mmol/L — ABNORMAL LOW (ref 3.5–5.1)
Sodium: 137 mmol/L (ref 135–145)
Total Bilirubin: 0.4 mg/dL (ref 0.3–1.2)
Total Protein: 5.9 g/dL — ABNORMAL LOW (ref 6.5–8.1)

## 2020-07-13 LAB — CBC WITH DIFFERENTIAL (CANCER CENTER ONLY)
Abs Immature Granulocytes: 0.27 10*3/uL — ABNORMAL HIGH (ref 0.00–0.07)
Basophils Absolute: 0.2 10*3/uL — ABNORMAL HIGH (ref 0.0–0.1)
Basophils Relative: 1 %
Eosinophils Absolute: 0.1 10*3/uL (ref 0.0–0.5)
Eosinophils Relative: 0 %
HCT: 28.3 % — ABNORMAL LOW (ref 36.0–46.0)
Hemoglobin: 9.4 g/dL — ABNORMAL LOW (ref 12.0–15.0)
Immature Granulocytes: 2 %
Lymphocytes Relative: 14 %
Lymphs Abs: 1.8 10*3/uL (ref 0.7–4.0)
MCH: 31.9 pg (ref 26.0–34.0)
MCHC: 33.2 g/dL (ref 30.0–36.0)
MCV: 95.9 fL (ref 80.0–100.0)
Monocytes Absolute: 2.3 10*3/uL — ABNORMAL HIGH (ref 0.1–1.0)
Monocytes Relative: 17 %
Neutro Abs: 8.8 10*3/uL — ABNORMAL HIGH (ref 1.7–7.7)
Neutrophils Relative %: 66 %
Platelet Count: 297 10*3/uL (ref 150–400)
RBC: 2.95 MIL/uL — ABNORMAL LOW (ref 3.87–5.11)
RDW: 18.5 % — ABNORMAL HIGH (ref 11.5–15.5)
WBC Count: 13.4 10*3/uL — ABNORMAL HIGH (ref 4.0–10.5)
nRBC: 0.3 % — ABNORMAL HIGH (ref 0.0–0.2)

## 2020-07-13 LAB — BASIC METABOLIC PANEL
BUN: 6 (ref 4–21)
CO2: 23 — AB (ref 13–22)
Chloride: 103 (ref 99–108)
Creatinine: 0.4 — AB (ref 0.5–1.1)
Glucose: 84
Potassium: 3.2 — AB (ref 3.4–5.3)
Sodium: 139 (ref 137–147)

## 2020-07-13 LAB — CBC AND DIFFERENTIAL
HCT: 28 — AB (ref 36–46)
Hemoglobin: 9.1 — AB (ref 12.0–16.0)
Platelets: 259 (ref 150–399)
WBC: 11.8

## 2020-07-13 LAB — HEPATIC FUNCTION PANEL
ALT: 10 (ref 7–35)
AST: 17 (ref 13–35)
Alkaline Phosphatase: 110 (ref 25–125)
Bilirubin, Total: 0.3

## 2020-07-13 LAB — CBC: RBC: 2.92 — AB (ref 3.87–5.11)

## 2020-07-13 MED ORDER — SODIUM CHLORIDE 0.9 % IV SOLN
Freq: Once | INTRAVENOUS | Status: AC
  Filled 2020-07-13: qty 250

## 2020-07-13 MED ORDER — SODIUM CHLORIDE 0.9 % IV SOLN
80.0000 mg/m2 | Freq: Once | INTRAVENOUS | Status: AC
  Administered 2020-07-13: 170 mg via INTRAVENOUS
  Filled 2020-07-13: qty 8.5

## 2020-07-13 MED ORDER — SODIUM CHLORIDE 0.9 % IV SOLN
10.0000 mg | Freq: Once | INTRAVENOUS | Status: AC
  Administered 2020-07-13: 10 mg via INTRAVENOUS
  Filled 2020-07-13: qty 10

## 2020-07-13 MED ORDER — PALONOSETRON HCL INJECTION 0.25 MG/5ML
INTRAVENOUS | Status: AC
  Filled 2020-07-13: qty 5

## 2020-07-13 MED ORDER — PALONOSETRON HCL INJECTION 0.25 MG/5ML
0.2500 mg | Freq: Once | INTRAVENOUS | Status: AC
  Administered 2020-07-13: 0.25 mg via INTRAVENOUS

## 2020-07-13 MED ORDER — SODIUM CHLORIDE 0.9 % IV SOLN
150.0000 mg | Freq: Once | INTRAVENOUS | Status: AC
  Administered 2020-07-13: 150 mg via INTRAVENOUS
  Filled 2020-07-13: qty 150

## 2020-07-13 MED ORDER — SODIUM CHLORIDE 0.9 % IV SOLN
410.4000 mg | Freq: Once | INTRAVENOUS | Status: AC
  Administered 2020-07-13: 410.4 mg via INTRAVENOUS
  Filled 2020-07-13: qty 41.04

## 2020-07-13 NOTE — Patient Instructions (Signed)
[place] Discharge Instructions for Patients Receiving Chemotherapy  Today you received the following chemotherapy agents Carboplatin and Etoposide  To help prevent nausea and vomiting after your treatment, we encourage you to take your nausea medication as directed.   If you develop nausea and vomiting that is not controlled by your nausea medication, call the clinic.   BELOW ARE SYMPTOMS THAT SHOULD BE REPORTED IMMEDIATELY:  *FEVER GREATER THAN 100.5 F  *CHILLS WITH OR WITHOUT FEVER  NAUSEA AND VOMITING THAT IS NOT CONTROLLED WITH YOUR NAUSEA MEDICATION  *UNUSUAL SHORTNESS OF BREATH  *UNUSUAL BRUISING OR BLEEDING  TENDERNESS IN MOUTH AND THROAT WITH OR WITHOUT PRESENCE OF ULCERS  *URINARY PROBLEMS  *BOWEL PROBLEMS  UNUSUAL RASH Items with * indicate a potential emergency and should be followed up as soon as possible.  Feel free to call the clinic should you have any questions or concerns. The clinic phone number is [phone].  Please show the [facility] at check-in to the Emergency Department and triage nurse.

## 2020-07-13 NOTE — Progress Notes (Signed)
Location:    [facility] Room Number: 211/D Place of Service:  SNF [phone]) Provider: Windell Moulding NP   [name], MD  Patient Care Team: [name], MD as PCP - General (Family Medicine) Lorretta Harp, MD as PCP - Cardiology (Cardiology) Gavin Pound, MD as Consulting Physician (Rheumatology) Valrie Hart, RN as Oncology Nurse Navigator  Extended Emergency Contact Information Primary Emergency Contact: [name] Mobile Phone: [phone] Relation: Daughter Secondary Emergency Contact: [name] Mobile Phone: [phone] Relation: Daughter Preferred language: Cleophus Molt Interpreter needed? No  Code Status:  Full Code Goals of care: Advanced Directive information Advanced Directives 07/13/2020  Does Patient Have a Medical Advance Directive? Yes  Type of Advance Directive -  Does patient want to make changes to medical advance directive? No - Patient declined  Copy of [facility] [employer] in Chart? -  Would patient like information on creating a medical advance directive? -     Chief Complaint  Patient presents with  . Medical Management of Chronic Issues    Routine Visit of Medical Management    HPI:  Pt is a 79 y.o. female seen today for medical management of chronic diseases.    She is a resident of [place], seen at bedside today. 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 She is currently followed by Dr. Earlie Server and receiving systemic chemotherapy. On 07/06/2020 she was taken to [facility] ED for thrombocytopenia, related to chemotherapy. Since the patient was asymptomatic and initial platelet count improved from 19 to 36, she did not receive a transfusion and was sent back to [facility]. Follow up with Dr. Earlie Server later on today.   In  addition, she has been treated for hypokalemia twice within the past two weeks with potassium chloride tablets and daily Gatorade.   Today, she complains of diarrhea. States it has been on and off for the past two weeks. Sometimes she is going 5 times a day. Does not think it is related to her chemotherapy.   She is eating 2 meals daily.Complains of hunger while examined. Tried to drink fluids throughout the day.   Her husband is currently hospitalized for recent gallbladder surgery. She is worried about his health. Wishes she could be there for him. Upset she is still at [facility]. She would like to go home. She has a semi- private room and complains of her roommate yelling and screaming at night. She is requesting something to help her sleep. Facility nurse states she sleeps well  and has not asked staff for sleeping aid.   She denies shortness of breath, chest pain, fatigue, bloody stool or hematuria.   Facility nurse does not report any concerns.   Moderna booster vaccine given 07/09/2020       Past Medical History:  Diagnosis Date  . A-fib ([place])   . Dysrhythmia   . GERD (gastroesophageal reflux disease)   . Hemorrhoids   . Hyperlipidemia   . Hypertension   . Lung cancer ([place]) 2000   RLL  . Obesity   . Persistent atrial fibrillation ([place])   . Rheumatoid arthritis [facility])    Past Surgical History:  Procedure Laterality Date  . BREAST EXCISIONAL BIOPSY Left    x 2  . BREAST EXCISIONAL BIOPSY Right   . BRONCHIAL BIOPSY  02/18/2020   Procedure:  BRONCHIAL BIOPSIES;  Surgeon: Collene Gobble, MD;  Location: [facility] ENDOSCOPY;  Service: Cardiopulmonary;;  . BRONCHIAL BRUSHINGS  02/18/2020   Procedure: BRONCHIAL BRUSHINGS;  Surgeon: Collene Gobble, MD;  Location: [place];  Service: Cardiopulmonary;;  . BRONCHIAL NEEDLE ASPIRATION BIOPSY  02/18/2020   Procedure: BRONCHIAL NEEDLE ASPIRATION BIOPSIES;  Surgeon: Collene Gobble, MD;  Location: [place];  Service: Cardiopulmonary;;   . BRONCHIAL WASHINGS  02/18/2020   Procedure: BRONCHIAL WASHINGS;  Surgeon: Collene Gobble, MD;  Location: [place];  Service: Cardiopulmonary;;  . CARDIOVERSION N/A 01/09/2018   Procedure: CARDIOVERSION;  Surgeon: Josue Hector, MD;  Location[facility] ENDOSCOPY;  Service: Cardiovascular;  Laterality: N/A;  . CARDIOVERSION N/A 03/01/2018   Procedure: CARDIOVERSION;  Surgeon: Sueanne Margarita, MD;  Location: [facility] ENDOSCOPY;  Service: Cardiovascular;  Laterality: N/A;  . ELECTROMAGNETIC NAVIGATION BROCHOSCOPY N/A 02/18/2020   Procedure: ELECTROMAGNETIC NAVIGATION BRONCHOSCOPY;  Surgeon: Collene Gobble, MD;  Location: [place];  Service: Cardiopulmonary;  Laterality: N/A;  . KNEE SURGERY    . LUNG LOBECTOMY Right 2000   RLL removed  . VIDEO BRONCHOSCOPY N/A 02/18/2020   Procedure: VIDEO BRONCHOSCOPY WITH FLUORO;  Surgeon: Collene Gobble, MD;  Location: [facility] ENDOSCOPY;  Service: Cardiopulmonary;  Laterality: N/A;  . VIDEO BRONCHOSCOPY WITH ENDOBRONCHIAL ULTRASOUND N/A 04/22/2020   Procedure: VIDEO BRONCHOSCOPY WITH ENDOBRONCHIAL ULTRASOUND;  Surgeon: [name], MD;  Location: [facility] OR;  Service: Thoracic;  Laterality: N/A;    Allergies  Allergen Reactions  . Morphine Anaphylaxis  . Morphine And Related Other (See Comments)    "vitals go down", pass out  . Sulfa Antibiotics Rash    Allergies as of 07/13/2020      Reactions   Morphine Anaphylaxis   Morphine And Related Other (See Comments)   "vitals go down", pass out   Sulfa Antibiotics Rash      Medication List       Accurate as of July 13, 2020  9:26 AM. If you have any questions, ask your nurse or doctor.        STOP taking these medications   cefTRIAXone 1 g injection Commonly known as: ROCEPHIN Stopped by: Yvonna Alanis, NP   lidocaine 1 % (with preservative) injection Commonly known as: XYLOCAINE Stopped by: Yvonna Alanis, NP   potassium chloride SA 20 MEQ tablet Commonly known as: KLOR-CON Stopped by: Yvonna Alanis, NP   saccharomyces boulardii 250 MG capsule Commonly known as: FLORASTOR Stopped by: Yvonna Alanis, NP     TAKE these medications   bisacodyl 10 MG suppository Commonly known as: DULCOLAX Place 10 mg rectally See admin instructions. If not relieved by MOM give 10mg  Bisacodyl supp. Rectally for x1 dose in 24 hours prn constipation   cyanocobalamin 1000 MCG tablet Take 1 tablet (1,000 mcg total) by mouth daily.   diclofenac Sodium 1 % Gel Commonly known as: Voltaren Apply 4 g topically 4 (four) times daily. To bilateral knees and ankles   gabapentin 100 MG capsule Commonly known as: NEURONTIN Take 100 mg by mouth at bedtime.   levocetirizine 5 MG tablet Commonly known as: XYZAL Take 5 mg by mouth daily.   metoprolol succinate 25 MG 24 hr tablet Commonly known as: TOPROL-XL Take 12.5 mg by mouth at bedtime.   Myrbetriq 25 MG Tb24 tablet Generic drug: mirabegron ER Take 25 mg by mouth daily.   NON FORMULARY Initiate 4oz Medpass qd d/t weight loss (prefers butter pecan).   NON FORMULARY  Magic Cup with L/D meals.   NON FORMULARY GATORADE: GIVE 1 BOTTLE (12 OUNCES) BY MOUTH DAILY FOR ELECTROLYTE SUPPLEMENT X 7 DAYS ;DISLIKES ORAGNE   OXYGEN Inhale 2 L into the lungs as needed (to maintain O2 saturation of 90%).   prochlorperazine 10 MG tablet Commonly known as: COMPAZINE Take 10 mg by mouth every 6 (six) hours as needed for nausea or vomiting.   RA SALINE ENEMA RE Place 1 Container rectally See admin instructions. If not relieved by bisacodyl supp. Give disposable saline enema rectally x1 dose 24 hours prn constipation   simvastatin 20 MG tablet Commonly known as: ZOCOR Take 20 mg by mouth at bedtime.   timolol 0.5 % ophthalmic solution Commonly known as: TIMOPTIC Place 1 drop into both eyes at bedtime.   warfarin 2.5 MG tablet Commonly known as: COUMADIN Take as directed by the anticoagulation clinic. If you are unsure how to take this medication, talk  to your nurse or doctor. Original instructions: Take 2.5 mg by mouth daily.       Review of Systems  Constitutional: Negative for activity change, appetite change and fever.  HENT: Negative for congestion and trouble swallowing.   Eyes: Negative for visual disturbance.  Respiratory: Negative for cough and shortness of breath.   Cardiovascular: Negative for chest pain and leg swelling.  Gastrointestinal: Positive for diarrhea. Negative for abdominal pain, blood in stool, constipation, nausea and vomiting.  Genitourinary: Positive for frequency. Negative for dysuria and hematuria.  Musculoskeletal: Negative for arthralgias and myalgias.  Skin:       Dry skin  Neurological: Positive for weakness. Negative for dizziness and headaches.  Psychiatric/Behavioral: Positive for sleep disturbance. Negative for dysphoric mood. The patient is not nervous/anxious.     Immunization History  Administered Date(s) Administered  . Influenza, High Dose Seasonal PF 06/27/2016, 05/23/2017, 06/08/2018  . Influenza-Unspecified 06/09/2020  . Moderna SARS-COVID-2 Vaccination 11/12/2019, 12/10/2019  . Pneumococcal Conjugate-13 01/04/2017  . Pneumococcal Polysaccharide-23 01/17/2018   Pertinent  Health Maintenance Due  Topic Date Due  . INFLUENZA VACCINE  Completed  . DEXA SCAN  Completed  . PNA vac Low Risk Adult  Completed   No flowsheet data found. Functional Status Survey:    Vitals:   07/13/20 0912  BP: 114/62  Pulse: 89  Resp: 18  Temp: 97.7 F (36.5 C)  SpO2: 96%  Weight: 205 lb (93 kg)  Height: 5\' 1"  (1.549 m)   Body mass index is 38.73 kg/m. Physical Exam Vitals and nursing note reviewed.  Constitutional:      General: She is not in acute distress.    Appearance: Normal appearance.  HENT:     Head: Normocephalic.     Right Ear: There is no impacted cerumen.     Left Ear: There is no impacted cerumen.     Mouth/Throat:     Mouth: Mucous membranes are moist.     Pharynx: No  posterior oropharyngeal erythema.  Eyes:     Extraocular Movements: Extraocular movements intact.     Pupils: Pupils are equal, round, and reactive to light.  Cardiovascular:     Rate and Rhythm: Normal rate. Rhythm irregular.     Pulses: Normal pulses.     Heart sounds: Normal heart sounds. No murmur heard.   Pulmonary:     Effort: Pulmonary effort is normal. No respiratory distress.     Breath sounds: Normal breath sounds. No wheezing.  Abdominal:     General: Bowel sounds are normal. There is  no distension.     Palpations: Abdomen is soft.     Tenderness: There is no abdominal tenderness.  Musculoskeletal:     Right lower leg: No edema.     Left lower leg: No edema.  Lymphadenopathy:     Cervical: No cervical adenopathy.  Skin:    General: Skin is warm and dry.     Capillary Refill: Capillary refill takes less than 2 seconds.  Neurological:     General: No focal deficit present.     Mental Status: She is alert and oriented to person, place, and time. Mental status is at baseline.     Motor: Weakness present.  Psychiatric:        Mood and Affect: Mood normal.        Behavior: Behavior normal.        Thought Content: Thought content normal.        Judgment: Judgment normal.     Labs reviewed: Recent Labs    02/15/20 0121 02/16/20 0431 04/18/20 0239 04/18/20 0239 04/19/20 0801 04/19/20 0801 04/20/20 0807 04/21/20 0727 06/08/20 1410 06/16/20 0000 06/22/20 1320 06/29/20 0000 07/06/20 2224  NA 138   < > 137   < > 137   < > 138   < > 138   < > 139 142 136  K 3.5   < > 4.8   < > 3.6   < > 3.4*   < > 3.0*   < > 2.8* 3.2* 2.9*  CL 102   < > 106   < > 104   < > 103   < > 101   < > 103 101 98  CO2 23   < > 24   < > 22   < > 23   < > 31   < > 29 28* 24  GLUCOSE 99   < > 89   < > 88   < > 90   < > 98  --  93  --  88  BUN 12   < > 10   < > 11   < > 10   < > 10   < > 5* 8 7*  CREATININE 0.82   < > 0.85   < > 0.77   < > 0.80   < > 0.54   < > 0.59 0.3* 0.58  CALCIUM 9.0    < > 8.6*   < > 8.6*   < > 8.7*   < > 9.1   < > 9.1 9.0 8.5*  MG 1.9   < > 2.2  --  1.8  --  1.8  --   --   --   --   --   --   PHOS 3.0  --   --   --   --   --   --   --   --   --   --   --   --    < > = values in this interval not displayed.   Recent Labs    06/01/20 1025 06/01/20 1025 06/08/20 1410 06/22/20 1320 06/29/20 0000  AST 29   < > 13* 26 13  ALT 38   < > 14 36 14  ALKPHOS 95  --  68 84  --   BILITOT 0.6  --  1.1 0.5  --   PROT 6.8  --  5.8* 6.0*  --   ALBUMIN 3.0*  --  2.8* 2.7*  --    < > =  values in this interval not displayed.   Recent Labs    06/08/20 1410 06/16/20 0000 06/22/20 1320 06/29/20 0000 07/06/20 2224  WBC 1.1*   < > 13.1* 2.5 20.4*  NEUTROABS 0.4*  --  8.5*  --  12.4*  HGB 11.7*   < > 11.9* 10.7* 9.5*  HCT 34.5*   < > 35.9* 33* 28.2*  MCV 91.3  --  94.0  --  93.1  PLT 114*   < > 344 156 36*   < > = values in this interval not displayed.   Lab Results  Component Value Date   TSH 2.458 02/15/2020   Lab Results  Component Value Date   HGBA1C 5.7 (H) 02/15/2020   No results found for: CHOL, HDL, LDLCALC, LDLDIRECT, TRIG, CHOLHDL  Significant Diagnostic Results in last 30 days:  CT Chest W Contrast  Result Date: 06/19/2020 CLINICAL DATA:  Primary Cancer Type: Lung Imaging Indication: Assess response to therapy Interval therapy since last imaging? 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 Primary Tumor location: Right upper lobe. Surgeries: Right lower lobectomy 2000. Chemotherapy: Yes; Ongoing? Yes; Most recent administration: 06/01/2020 Immunotherapy? No Radiation therapy? No EXAM: CT CHEST WITH CONTRAST TECHNIQUE: Multidetector CT imaging of the chest was performed during intravenous contrast administration. CONTRAST:  69mL OMNIPAQUE IOHEXOL 300 MG/ML  SOLN COMPARISON:  03/10/2020 PET-CT.  CT chest 02/14/2020. FINDINGS: Cardiovascular: Aortic  atherosclerosis. Left and right coronary artery calcifications and/or stents. Mild cardiomegaly. No pericardial effusion. Mediastinum/Nodes: Interval decrease in size of enlarged pretracheal lymph nodes, largest node measuring 1.4 x 1.2 cm, previously 2.5 x 2.3 cm (series 3, image 47). Thyroid gland, trachea, and esophagus demonstrate no significant findings. Lungs/Pleura: Status post right middle lobectomy. Interval decrease in size of a suprahilar nodule of the right upper lobe, measuring 1.7 x 1.2 cm, previously 2.1 x 1.6 cm (series 4, image 41). Interval decrease in size of nodular soft tissue involving the pleura at the right apex, measuring 1.7 x 1.4 cm, previously 2.8 x 2.1 cm (series 3, image 22). Interval decrease in irregular nodularity of the peripheral right upper lobe, largest component approximately 6 mm, previously 1.4 cm (series 4, image 52). New small right pleural effusion. Upper Abdomen: No acute abnormality. Musculoskeletal: No chest wall mass or suspicious bone lesions identified. IMPRESSION: 1. Interval decrease in size of a suprahilar nodule of the right upper lobe and nodular soft tissue involving the pleura at the right apex. 2. Interval decrease in size of enlarged pretracheal lymph nodes. 3. Findings are consistent with treatment response. 4. Interval decrease in irregular nodularity of the peripheral right upper lobe. Given appearance this more likely reflects resolving nonspecific infection or inflammation. 5. New small right pleural effusion, nonspecific. No obvious pleural nodularity or soft tissue. 6. Status post right middle lobectomy. 7. Coronary artery disease.  Aortic Atherosclerosis (ICD10-I70.0). Electronically Signed   By: Eddie Candle M.D.   On: 06/19/2020 11:36    Assessment/Plan 1. Persistent atrial fibrillation ([place]) - followed by cardiology - rate controlled at this time, patient asymptomatic  - continue coumadin and metoprolol -continue low sodium diet - continue  to increase physical activity as tolerated  2. Generalized weakness - ongoing at this time, not receiving PT/OT, no recent falls - continues to be dependent with transfers, ambulated with wheelchair or scooter - continue to increase physical activity as tolerated  3. Essential hypertension - bp < 140/80, at goal - continue metoprolol  regimen - continue low sodium diet  4. Thrombocytopenia ([place]) - stable at this time, related to chemotherapy - followed by oncology  5. Mixed hyperlipidemia - stable at this time - continue statin regimen  6. Long term (current) use of anticoagulants - stable,asymptomatic at this time - continue coumadin regimen and INR checks  7. Non-small cell carcinoma of right lung, stage 3 ([facility]) - followed by Dr. Earlie Server, receiving chemotherapy - she denies trouble breathing, not requiring oxygen  8. Small cell lung cancer, right upper lobe ([place]) - followed by Dr. Earlie Server, receiving chemotherapy - she denies trouble breathing, not requiring oxygen  9. Primary insomnia - she complains of her roommate keeping her up at night - nursing staff denies complaints of insomnia, states she sleeps well at night  10. Diarrhea, unspecified type - concerned she is still having episodes of diarrhea, also explains recent hypokalemia - suspect diarrhea is related to chemotherapy, but resident close to her has c-diff - since she is immunocompromised at this time, I will check stool to r/o c-diff - C-diff PCR    Family/ staff Communication: Plan discussed with patient and facility nurse  Labs/tests ordered: c-diff

## 2020-07-13 NOTE — Progress Notes (Signed)
[facility] [phone]   [phone]) [phone]  OFFICE PROGRESS NOTE  [name], MD [place] [address] [place] [address]  DIAGNOSIS: Limited stage, stage IIIb (T2 a, N2, M0) presented with right upper lobe lung mass in addition to mediastinal lymphadenopathy diagnosed in June 2021.  PRIOR THERAPY: None  CURRENT THERAPY: Systemic chemotherapy with carboplatin for AUC of 5 from day 1 and etoposide 100 mg/M2 on days 1, 2 and 3 with Neulasta support every 3 weeks.  First dose 05/11/2020.  Status post 3 cycles.    INTERVAL HISTORY: Leah Velasquez 79 y.o. female returns to the clinic today for follow-up visit accompanied by her [place].  The patient is feeling fine today with no concerning complaints except for fatigue and mild peripheral neuropathy in the hands and toes.  She had few episodes of diarrhea earlier today but she did not have any diarrhea in the past few weeks.  She also has some blurry vision.  She denied having any chest pain, shortness of breath except with exertion with no cough or hemoptysis.  She denied having any fever or chills.  She has no nausea, vomiting, abdominal pain or constipation.  She tolerated the last cycle of her treatment fairly well.  She is here for evaluation before starting cycle #4 of her chemotherapy.  MEDICAL HISTORY: Past Medical History:  Diagnosis Date  . A-fib ([place])   . Dysrhythmia   . GERD (gastroesophageal reflux disease)   . Hemorrhoids   . Hyperlipidemia   . Hypertension   . Lung cancer ([place]) 2000   RLL  . Obesity   . Persistent atrial fibrillation ([place])   . Rheumatoid arthritis ([facility])     ALLERGIES:  is allergic to morphine, morphine and related, and sulfa antibiotics.  MEDICATIONS:  Current Outpatient Medications  Medication Sig Dispense Refill  . bisacodyl (DULCOLAX) 10 MG suppository Place 10 mg rectally See admin instructions. If not relieved by MOM give 10mg   Bisacodyl supp. Rectally for x1 dose in 24 hours prn constipation     . diclofenac Sodium (VOLTAREN) 1 % GEL Apply 4 g topically 4 (four) times daily. To bilateral knees and ankles 350 g 1  . gabapentin (NEURONTIN) 100 MG capsule Take 100 mg by mouth at bedtime.    Marland Kitchen levocetirizine (XYZAL) 5 MG tablet Take 5 mg by mouth daily.     . metoprolol succinate (TOPROL-XL) 25 MG 24 hr tablet Take 12.5 mg by mouth at bedtime.     Marland Kitchen MYRBETRIQ 25 MG TB24 tablet Take 25 mg by mouth daily.    . NON FORMULARY GATORADE: GIVE 1 BOTTLE (12 OUNCES) BY MOUTH DAILY FOR ELECTROLYTE SUPPLEMENT X 7 DAYS ;DISLIKES ORAGNE    . NON FORMULARY Initiate 4oz Medpass qd d/t weight loss (prefers butter pecan).    . NON FORMULARY Magic Cup with L/D meals.    . OXYGEN Inhale 2 L into the lungs as needed (to maintain O2 saturation of 90%).    Marland Kitchen prochlorperazine (COMPAZINE) 10 MG tablet Take 10 mg by mouth every 6 (six) hours as needed for nausea or vomiting.    . simvastatin (ZOCOR) 20 MG tablet Take 20 mg by mouth at bedtime.    . Sodium Phosphates (RA SALINE ENEMA RE) Place 1 Container rectally See admin instructions. If not relieved by bisacodyl supp. Give disposable saline enema rectally x1 dose 24 hours prn constipation     . timolol (TIMOPTIC)  0.5 % ophthalmic solution Place 1 drop into both eyes at bedtime.     . vitamin B-12 1000 MCG tablet Take 1 tablet (1,000 mcg total) by mouth daily. 30 tablet 0  . warfarin (COUMADIN) 2.5 MG tablet Take 2.5 mg by mouth daily.     No current facility-administered medications for this visit.    SURGICAL HISTORY:  Past Surgical History:  Procedure Laterality Date  . BREAST EXCISIONAL BIOPSY Left    x 2  . BREAST EXCISIONAL BIOPSY Right   . BRONCHIAL BIOPSY  02/18/2020   Procedure: BRONCHIAL BIOPSIES;  Surgeon: Collene Gobble, MD;  Location: [facility] ENDOSCOPY;  Service: Cardiopulmonary;;  . BRONCHIAL BRUSHINGS  02/18/2020   Procedure: BRONCHIAL BRUSHINGS;  Surgeon: Collene Gobble, MD;   Location: [facility];  Service: Cardiopulmonary;;  . BRONCHIAL NEEDLE ASPIRATION BIOPSY  02/18/2020   Procedure: BRONCHIAL NEEDLE ASPIRATION BIOPSIES;  Surgeon: Collene Gobble, MD;  Location: [facility];  Service: Cardiopulmonary;;  . BRONCHIAL WASHINGS  02/18/2020   Procedure: BRONCHIAL WASHINGS;  Surgeon: Collene Gobble, MD;  Location: [facility];  Service: Cardiopulmonary;;  . CARDIOVERSION N/A 01/09/2018   Procedure: CARDIOVERSION;  Surgeon: Josue Hector, MD;  Location: [facility] ENDOSCOPY;  Service: Cardiovascular;  Laterality: N/A;  . CARDIOVERSION N/A 03/01/2018   Procedure: CARDIOVERSION;  Surgeon: Sueanne Margarita, MD;  Location: [facility] ENDOSCOPY;  Service: Cardiovascular;  Laterality: N/A;  . ELECTROMAGNETIC NAVIGATION BROCHOSCOPY N/A 02/18/2020   Procedure: ELECTROMAGNETIC NAVIGATION BRONCHOSCOPY;  Surgeon: Collene Gobble, MD;  Location: [facility];  Service: Cardiopulmonary;  Laterality: N/A;  . KNEE SURGERY    . LUNG LOBECTOMY Right 2000   RLL removed  . VIDEO BRONCHOSCOPY N/A 02/18/2020   Procedure: VIDEO BRONCHOSCOPY WITH FLUORO;  Surgeon: Collene Gobble, MD;  Location: [facility] ENDOSCOPY;  Service: Cardiopulmonary;  Laterality: N/A;  . VIDEO BRONCHOSCOPY WITH ENDOBRONCHIAL ULTRASOUND N/A 04/22/2020   Procedure: VIDEO BRONCHOSCOPY WITH ENDOBRONCHIAL ULTRASOUND;  Surgeon: [name], MD;  Location: [facility] OR;  Service: Thoracic;  Laterality: N/A;    REVIEW OF SYSTEMS:  A comprehensive review of systems was negative except for: Constitutional: positive for fatigue Eyes: positive for visual disturbance Musculoskeletal: positive for muscle weakness Neurological: positive for paresthesia   PHYSICAL EXAMINATION: General appearance: alert, cooperative, fatigued and no distress Head: Normocephalic, without obvious abnormality, atraumatic Neck: no adenopathy, no JVD, supple, symmetrical, trachea midline and thyroid not enlarged, symmetric, no tenderness/mass/nodules Lymph nodes:  Cervical, supraclavicular, and axillary nodes normal. Resp: clear to auscultation bilaterally Back: symmetric, no curvature. ROM normal. No CVA tenderness. Cardio: regular rate and rhythm, S1, S2 normal, no murmur, click, rub or gallop GI: soft, non-tender; bowel sounds normal; no masses,  no organomegaly Extremities: extremities normal, atraumatic, no cyanosis or edema  ECOG PERFORMANCE STATUS: 1 - Symptomatic but completely ambulatory  Blood pressure 112/63, pulse 83, temperature 97.8 F (36.6 C), temperature source Tympanic, resp. rate 18, height 5\' 1"  (1.549 m), SpO2 95 %.  LABORATORY DATA: Lab Results  Component Value Date   WBC 13.4 (H) 07/13/2020   HGB 9.4 (L) 07/13/2020   HCT 28.3 (L) 07/13/2020   MCV 95.9 07/13/2020   PLT 297 07/13/2020      Chemistry      Component Value Date/Time   NA 136 07/06/2020 2224   NA 142 06/29/2020 0000   K 2.9 (L) 07/06/2020 2224   CL 98 07/06/2020 2224   CO2 24 07/06/2020 2224   BUN 7 (L) 07/06/2020 2224   BUN 8 06/29/2020  0000   CREATININE 0.58 07/06/2020 2224   CREATININE 0.59 06/22/2020 1320   GLU 96 06/29/2020 0000      Component Value Date/Time   CALCIUM 8.5 (L) 07/06/2020 2224   ALKPHOS 84 06/22/2020 1320   AST 13 06/29/2020 0000   AST 26 06/22/2020 1320   ALT 14 06/29/2020 0000   ALT 36 06/22/2020 1320   BILITOT 0.5 06/22/2020 1320       RADIOGRAPHIC STUDIES: CT Chest W Contrast  Result Date: 06/19/2020 CLINICAL DATA:  Primary Cancer Type: Lung Imaging Indication: Assess response to therapy Interval therapy since last imaging? 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 Primary Tumor location: Right upper lobe. Surgeries: Right lower lobectomy 2000. Chemotherapy: Yes; Ongoing? Yes; Most recent administration: 06/01/2020 Immunotherapy? No Radiation therapy? No EXAM: CT CHEST WITH CONTRAST TECHNIQUE: Multidetector CT imaging of the  chest was performed during intravenous contrast administration. CONTRAST:  44mL OMNIPAQUE IOHEXOL 300 MG/ML  SOLN COMPARISON:  03/10/2020 PET-CT.  CT chest 02/14/2020. FINDINGS: Cardiovascular: Aortic atherosclerosis. Left and right coronary artery calcifications and/or stents. Mild cardiomegaly. No pericardial effusion. Mediastinum/Nodes: Interval decrease in size of enlarged pretracheal lymph nodes, largest node measuring 1.4 x 1.2 cm, previously 2.5 x 2.3 cm (series 3, image 47). Thyroid gland, trachea, and esophagus demonstrate no significant findings. Lungs/Pleura: Status post right middle lobectomy. Interval decrease in size of a suprahilar nodule of the right upper lobe, measuring 1.7 x 1.2 cm, previously 2.1 x 1.6 cm (series 4, image 41). Interval decrease in size of nodular soft tissue involving the pleura at the right apex, measuring 1.7 x 1.4 cm, previously 2.8 x 2.1 cm (series 3, image 22). Interval decrease in irregular nodularity of the peripheral right upper lobe, largest component approximately 6 mm, previously 1.4 cm (series 4, image 52). New small right pleural effusion. Upper Abdomen: No acute abnormality. Musculoskeletal: No chest wall mass or suspicious bone lesions identified. IMPRESSION: 1. Interval decrease in size of a suprahilar nodule of the right upper lobe and nodular soft tissue involving the pleura at the right apex. 2. Interval decrease in size of enlarged pretracheal lymph nodes. 3. Findings are consistent with treatment response. 4. Interval decrease in irregular nodularity of the peripheral right upper lobe. Given appearance this more likely reflects resolving nonspecific infection or inflammation. 5. New small right pleural effusion, nonspecific. No obvious pleural nodularity or soft tissue. 6. Status post right middle lobectomy. 7. Coronary artery disease.  Aortic Atherosclerosis (ICD10-I70.0). Electronically Signed   By: Eddie Candle M.D.   On: 06/19/2020 11:36    ASSESSMENT  AND PLAN: This is a very pleasant 80 years old white female with a limited stage, stage IIIb (T2a, N2, M0) small cell lung cancer presented with right upper lobe lung mass in addition to mediastinal lymphadenopathy diagnosed in June 2021. The patient is currently undergoing treatment with systemic chemotherapy with carboplatin for AUC of 5 on day 1 and 2 etoposide 100 mg/M2 on days 1, 2 and 3 with Neulasta support.  Status post 3 cycles. The patient has been tolerating her treatment well with no concerning adverse effects except for the baseline fatigue and weakness. I recommended for her to proceed with cycle #4 today as planned. I will see her back for follow-up visit in 4 weeks for evaluation with repeat CT scan of the chest for restaging of her disease. The patient was advised to call immediately if she has any other concerning symptoms  in the interval. The patient voices understanding of current disease status and treatment options and is in agreement with the current care plan.  All questions were answered. The patient knows to call the clinic with any problems, questions or concerns. We can certainly see the patient much sooner if necessary.  Disclaimer: This note was dictated with voice recognition software. Similar sounding words can inadvertently be transcribed and may not be corrected upon review.

## 2020-07-14 ENCOUNTER — Inpatient Hospital Stay: Payer: Medicare HMO

## 2020-07-14 VITALS — BP 117/64 | HR 63 | Temp 97.7°F | Resp 20

## 2020-07-14 DIAGNOSIS — C3411 Malignant neoplasm of upper lobe, right bronchus or lung: Secondary | ICD-10-CM

## 2020-07-14 DIAGNOSIS — Z5111 Encounter for antineoplastic chemotherapy: Secondary | ICD-10-CM | POA: Diagnosis not present

## 2020-07-14 MED ORDER — SODIUM CHLORIDE 0.9 % IV SOLN
Freq: Once | INTRAVENOUS | Status: AC
  Filled 2020-07-14: qty 250

## 2020-07-14 MED ORDER — SODIUM CHLORIDE 0.9 % IV SOLN
10.0000 mg | Freq: Once | INTRAVENOUS | Status: AC
  Administered 2020-07-14: 10 mg via INTRAVENOUS
  Filled 2020-07-14: qty 10

## 2020-07-14 MED ORDER — SODIUM CHLORIDE 0.9 % IV SOLN
80.0000 mg/m2 | Freq: Once | INTRAVENOUS | Status: AC
  Administered 2020-07-14: 170 mg via INTRAVENOUS
  Filled 2020-07-14: qty 8.5

## 2020-07-14 NOTE — Patient Instructions (Signed)
[facility] Discharge Instructions for Patients Receiving Chemotherapy  Today you received the following chemotherapy agents: etoposide  To help prevent nausea and vomiting after your treatment, we encourage you to take your nausea medication as directed.   If you develop nausea and vomiting that is not controlled by your nausea medication, call the clinic.   BELOW ARE SYMPTOMS THAT SHOULD BE REPORTED IMMEDIATELY:  *FEVER GREATER THAN 100.5 F  *CHILLS WITH OR WITHOUT FEVER  NAUSEA AND VOMITING THAT IS NOT CONTROLLED WITH YOUR NAUSEA MEDICATION  *UNUSUAL SHORTNESS OF BREATH  *UNUSUAL BRUISING OR BLEEDING  TENDERNESS IN MOUTH AND THROAT WITH OR WITHOUT PRESENCE OF ULCERS  *URINARY PROBLEMS  *BOWEL PROBLEMS  UNUSUAL RASH Items with * indicate a potential emergency and should be followed up as soon as possible.  Feel free to call the clinic should you have any questions or concerns. The clinic phone number is [phone].  Please show the CHEMO ALERT CARD at check-in to the Emergency Department and triage nurse.   

## 2020-07-14 NOTE — Progress Notes (Signed)
Pt discharged in stable condition. Transferred to [place] via [facility].

## 2020-07-15 ENCOUNTER — Other Ambulatory Visit: Payer: Self-pay

## 2020-07-15 ENCOUNTER — Inpatient Hospital Stay: Payer: Medicare HMO

## 2020-07-15 VITALS — BP 112/71 | HR 86 | Temp 98.0°F | Resp 18

## 2020-07-15 DIAGNOSIS — Z5111 Encounter for antineoplastic chemotherapy: Secondary | ICD-10-CM | POA: Diagnosis not present

## 2020-07-15 DIAGNOSIS — C3411 Malignant neoplasm of upper lobe, right bronchus or lung: Secondary | ICD-10-CM

## 2020-07-15 MED ORDER — SODIUM CHLORIDE 0.9 % IV SOLN
Freq: Once | INTRAVENOUS | Status: AC
  Filled 2020-07-15: qty 250

## 2020-07-15 MED ORDER — SODIUM CHLORIDE 0.9 % IV SOLN
10.0000 mg | Freq: Once | INTRAVENOUS | Status: AC
  Administered 2020-07-15: 10 mg via INTRAVENOUS
  Filled 2020-07-15: qty 10

## 2020-07-15 MED ORDER — SODIUM CHLORIDE 0.9 % IV SOLN
80.0000 mg/m2 | Freq: Once | INTRAVENOUS | Status: AC
  Administered 2020-07-15: 170 mg via INTRAVENOUS
  Filled 2020-07-15: qty 8.5

## 2020-07-15 NOTE — Patient Instructions (Signed)
[facility] Discharge Instructions for Patients Receiving Chemotherapy  Today you received the following chemotherapy agents: etoposide  To help prevent nausea and vomiting after your treatment, we encourage you to take your nausea medication as directed.   If you develop nausea and vomiting that is not controlled by your nausea medication, call the clinic.   BELOW ARE SYMPTOMS THAT SHOULD BE REPORTED IMMEDIATELY:  *FEVER GREATER THAN 100.5 F  *CHILLS WITH OR WITHOUT FEVER  NAUSEA AND VOMITING THAT IS NOT CONTROLLED WITH YOUR NAUSEA MEDICATION  *UNUSUAL SHORTNESS OF BREATH  *UNUSUAL BRUISING OR BLEEDING  TENDERNESS IN MOUTH AND THROAT WITH OR WITHOUT PRESENCE OF ULCERS  *URINARY PROBLEMS  *BOWEL PROBLEMS  UNUSUAL RASH Items with * indicate a potential emergency and should be followed up as soon as possible.  Feel free to call the clinic should you have any questions or concerns. The clinic phone number is [phone].  Please show the CHEMO ALERT CARD at check-in to the Emergency Department and triage nurse.   

## 2020-07-17 ENCOUNTER — Inpatient Hospital Stay: Payer: Medicare HMO

## 2020-07-17 ENCOUNTER — Other Ambulatory Visit: Payer: Self-pay

## 2020-07-17 VITALS — BP 116/76 | HR 84 | Temp 97.8°F | Resp 18

## 2020-07-17 DIAGNOSIS — C3411 Malignant neoplasm of upper lobe, right bronchus or lung: Secondary | ICD-10-CM

## 2020-07-17 DIAGNOSIS — Z5111 Encounter for antineoplastic chemotherapy: Secondary | ICD-10-CM | POA: Diagnosis not present

## 2020-07-17 MED ORDER — PEGFILGRASTIM-JMDB 6 MG/0.6ML ~~LOC~~ SOSY
PREFILLED_SYRINGE | SUBCUTANEOUS | Status: AC
  Filled 2020-07-17: qty 0.6

## 2020-07-17 MED ORDER — PEGFILGRASTIM-JMDB 6 MG/0.6ML ~~LOC~~ SOSY
6.0000 mg | PREFILLED_SYRINGE | Freq: Once | SUBCUTANEOUS | Status: AC
  Administered 2020-07-17: 6 mg via SUBCUTANEOUS

## 2020-07-20 ENCOUNTER — Inpatient Hospital Stay: Payer: Medicare HMO

## 2020-07-24 ENCOUNTER — Emergency Department (HOSPITAL_COMMUNITY): Payer: Medicare HMO

## 2020-07-24 ENCOUNTER — Other Ambulatory Visit: Payer: Self-pay

## 2020-07-24 ENCOUNTER — Encounter (HOSPITAL_COMMUNITY): Payer: Self-pay | Admitting: Emergency Medicine

## 2020-07-24 ENCOUNTER — Inpatient Hospital Stay (HOSPITAL_COMMUNITY)
Admission: EM | Admit: 2020-07-24 | Discharge: 2020-07-28 | DRG: 809 | Disposition: A | Discharge status: Skilled Nursing Facility | Payer: Medicare HMO | Source: Skilled Nursing Facility | Attending: Internal Medicine | Admitting: Internal Medicine

## 2020-07-24 DIAGNOSIS — K219 Gastro-esophageal reflux disease without esophagitis: Secondary | ICD-10-CM | POA: Diagnosis present

## 2020-07-24 DIAGNOSIS — I10 Essential (primary) hypertension: Secondary | ICD-10-CM | POA: Diagnosis present

## 2020-07-24 DIAGNOSIS — I4819 Other persistent atrial fibrillation: Secondary | ICD-10-CM | POA: Diagnosis present

## 2020-07-24 DIAGNOSIS — Z7901 Long term (current) use of anticoagulants: Secondary | ICD-10-CM

## 2020-07-24 DIAGNOSIS — Z87891 Personal history of nicotine dependence: Secondary | ICD-10-CM

## 2020-07-24 DIAGNOSIS — C3490 Malignant neoplasm of unspecified part of unspecified bronchus or lung: Secondary | ICD-10-CM | POA: Diagnosis present

## 2020-07-24 DIAGNOSIS — R5381 Other malaise: Secondary | ICD-10-CM

## 2020-07-24 DIAGNOSIS — Z9981 Dependence on supplemental oxygen: Secondary | ICD-10-CM

## 2020-07-24 DIAGNOSIS — E785 Hyperlipidemia, unspecified: Secondary | ICD-10-CM | POA: Diagnosis present

## 2020-07-24 DIAGNOSIS — Z20822 Contact with and (suspected) exposure to covid-19: Secondary | ICD-10-CM | POA: Diagnosis present

## 2020-07-24 DIAGNOSIS — R109 Unspecified abdominal pain: Secondary | ICD-10-CM

## 2020-07-24 DIAGNOSIS — J9611 Chronic respiratory failure with hypoxia: Secondary | ICD-10-CM | POA: Diagnosis present

## 2020-07-24 DIAGNOSIS — D61818 Other pancytopenia: Secondary | ICD-10-CM | POA: Diagnosis not present

## 2020-07-24 DIAGNOSIS — K5900 Constipation, unspecified: Secondary | ICD-10-CM | POA: Diagnosis present

## 2020-07-24 DIAGNOSIS — L8990 Pressure ulcer of unspecified site, unspecified stage: Secondary | ICD-10-CM | POA: Insufficient documentation

## 2020-07-24 DIAGNOSIS — Z79899 Other long term (current) drug therapy: Secondary | ICD-10-CM

## 2020-07-24 DIAGNOSIS — Z882 Allergy status to sulfonamides status: Secondary | ICD-10-CM

## 2020-07-24 DIAGNOSIS — Z885 Allergy status to narcotic agent status: Secondary | ICD-10-CM

## 2020-07-24 DIAGNOSIS — M069 Rheumatoid arthritis, unspecified: Secondary | ICD-10-CM | POA: Diagnosis present

## 2020-07-24 DIAGNOSIS — E876 Hypokalemia: Secondary | ICD-10-CM

## 2020-07-24 DIAGNOSIS — Z23 Encounter for immunization: Secondary | ICD-10-CM

## 2020-07-24 LAB — CBC
HCT: 20.3 % — ABNORMAL LOW (ref 36.0–46.0)
Hemoglobin: 6.6 g/dL — CL (ref 12.0–15.0)
MCH: 32.7 pg (ref 26.0–34.0)
MCHC: 32.5 g/dL (ref 30.0–36.0)
MCV: 100.5 fL — ABNORMAL HIGH (ref 80.0–100.0)
Platelets: 17 10*3/uL — CL (ref 150–400)
RBC: 2.02 MIL/uL — ABNORMAL LOW (ref 3.87–5.11)
RDW: 17.9 % — ABNORMAL HIGH (ref 11.5–15.5)
WBC: 1.2 10*3/uL — CL (ref 4.0–10.5)
nRBC: 0 % (ref 0.0–0.2)

## 2020-07-24 LAB — COMPREHENSIVE METABOLIC PANEL
ALT: 13 U/L (ref 0–44)
AST: 16 U/L (ref 15–41)
Albumin: 2.9 g/dL — ABNORMAL LOW (ref 3.5–5.0)
Alkaline Phosphatase: 65 U/L (ref 38–126)
Anion gap: 12 (ref 5–15)
BUN: 12 mg/dL (ref 8–23)
CO2: 27 mmol/L (ref 22–32)
Calcium: 8 mg/dL — ABNORMAL LOW (ref 8.9–10.3)
Chloride: 97 mmol/L — ABNORMAL LOW (ref 98–111)
Creatinine, Ser: 0.34 mg/dL — ABNORMAL LOW (ref 0.44–1.00)
GFR, Estimated: >60 mL/min (ref >60)
Glucose, Bld: 100 mg/dL — ABNORMAL HIGH (ref 70–99)
Potassium: 3.1 mmol/L — ABNORMAL LOW (ref 3.5–5.1)
Sodium: 136 mmol/L (ref 135–145)
Total Bilirubin: 1.4 mg/dL — ABNORMAL HIGH (ref 0.3–1.2)
Total Protein: 6.2 g/dL — ABNORMAL LOW (ref 6.5–8.1)

## 2020-07-24 LAB — CBC WITH DIFFERENTIAL/PLATELET
Abs Immature Granulocytes: 0.02 10*3/uL (ref 0.00–0.07)
Basophils Absolute: 0 10*3/uL (ref 0.0–0.1)
Basophils Relative: 1 %
Eosinophils Absolute: 0 10*3/uL (ref 0.0–0.5)
Eosinophils Relative: 1 %
HCT: 25.4 % — ABNORMAL LOW (ref 36.0–46.0)
Hemoglobin: 8.5 g/dL — ABNORMAL LOW (ref 12.0–15.0)
Immature Granulocytes: 2 %
Lymphocytes Relative: 85 %
Lymphs Abs: 0.8 10*3/uL (ref 0.7–4.0)
MCH: 31 pg (ref 26.0–34.0)
MCHC: 33.5 g/dL (ref 30.0–36.0)
MCV: 92.7 fL (ref 80.0–100.0)
Monocytes Absolute: 0.1 10*3/uL (ref 0.1–1.0)
Monocytes Relative: 10 %
Neutro Abs: 0 10*3/uL — CL (ref 1.7–7.7)
Neutrophils Relative %: 1 %
Platelets: 18 10*3/uL — CL (ref 150–400)
RBC: 2.74 MIL/uL — ABNORMAL LOW (ref 3.87–5.11)
RDW: 18 % — ABNORMAL HIGH (ref 11.5–15.5)
WBC: 0.9 10*3/uL — CL (ref 4.0–10.5)
nRBC: 0 % (ref 0.0–0.2)

## 2020-07-24 LAB — BLOOD GAS, ARTERIAL
Acid-Base Excess: 5.7 mmol/L — ABNORMAL HIGH (ref 0.0–2.0)
Bicarbonate: 29.3 mmol/L — ABNORMAL HIGH (ref 20.0–28.0)
FIO2: 21
O2 Saturation: 95.2 %
Patient temperature: 98.6
pCO2 arterial: 39.9 mmHg (ref 32.0–48.0)
pH, Arterial: 7.479 — ABNORMAL HIGH (ref 7.350–7.450)
pO2, Arterial: 72.5 mmHg — ABNORMAL LOW (ref 83.0–108.0)

## 2020-07-24 LAB — GLUCOSE, CAPILLARY: Glucose-Capillary: 93 mg/dL (ref 70–99)

## 2020-07-24 LAB — DIFFERENTIAL
Abs Immature Granulocytes: 0.04 10*3/uL (ref 0.00–0.07)
Basophils Absolute: 0 10*3/uL (ref 0.0–0.1)
Basophils Relative: 0 %
Eosinophils Absolute: 0 10*3/uL (ref 0.0–0.5)
Eosinophils Relative: 1 %
Immature Granulocytes: 3 %
Lymphocytes Relative: 91 %
Lymphs Abs: 1.1 10*3/uL (ref 0.7–4.0)
Monocytes Absolute: 0 10*3/uL — ABNORMAL LOW (ref 0.1–1.0)
Monocytes Relative: 2 %
Neutro Abs: 0 10*3/uL — CL (ref 1.7–7.7)
Neutrophils Relative %: 3 %

## 2020-07-24 LAB — PREPARE RBC (CROSSMATCH)

## 2020-07-24 LAB — POC OCCULT BLOOD, ED: Fecal Occult Bld: NEGATIVE

## 2020-07-24 LAB — MAGNESIUM: Magnesium: 1.2 mg/dL — ABNORMAL LOW (ref 1.7–2.4)

## 2020-07-24 LAB — PROTIME-INR
INR: 1.3 — ABNORMAL HIGH (ref 0.8–1.2)
Prothrombin Time: 15.2 seconds (ref 11.4–15.2)

## 2020-07-24 LAB — AMMONIA: Ammonia: 20 umol/L (ref 9–35)

## 2020-07-24 LAB — HEMOGLOBIN AND HEMATOCRIT, BLOOD
HCT: 25.2 % — ABNORMAL LOW (ref 36.0–46.0)
Hemoglobin: 8.4 g/dL — ABNORMAL LOW (ref 12.0–15.0)

## 2020-07-24 LAB — RESP PANEL BY RT-PCR (FLU A&B, COVID) ARPGX2
Influenza A by PCR: NEGATIVE
Influenza B by PCR: NEGATIVE
SARS Coronavirus 2 by RT PCR: NEGATIVE

## 2020-07-24 IMAGING — CR DG ABDOMEN ACUTE W/ 1V CHEST
5 series · 5 of 5 positions shown · non-contrast
Comparison: Chest CT [DATE]

CLINICAL DATA: Abdominal discomfort

EXAM:
DG ABDOMEN ACUTE WITH 1 VIEW CHEST

[x chest ap]
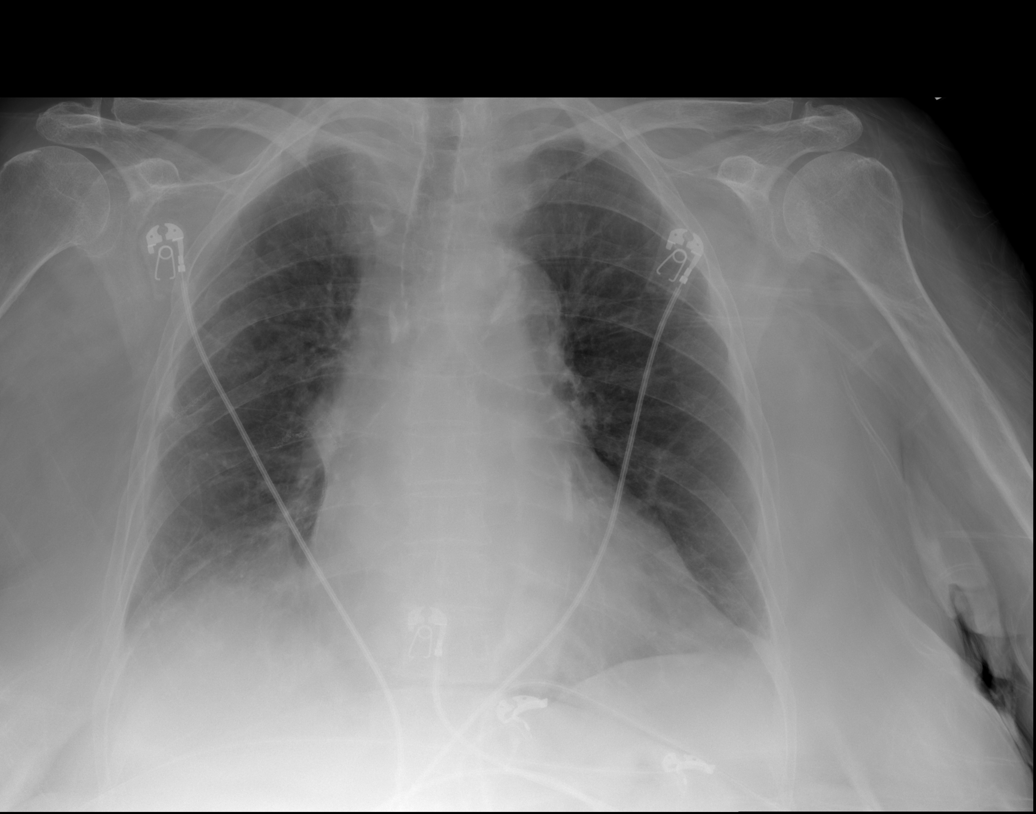

[x abdomen supine (1 of 2)]
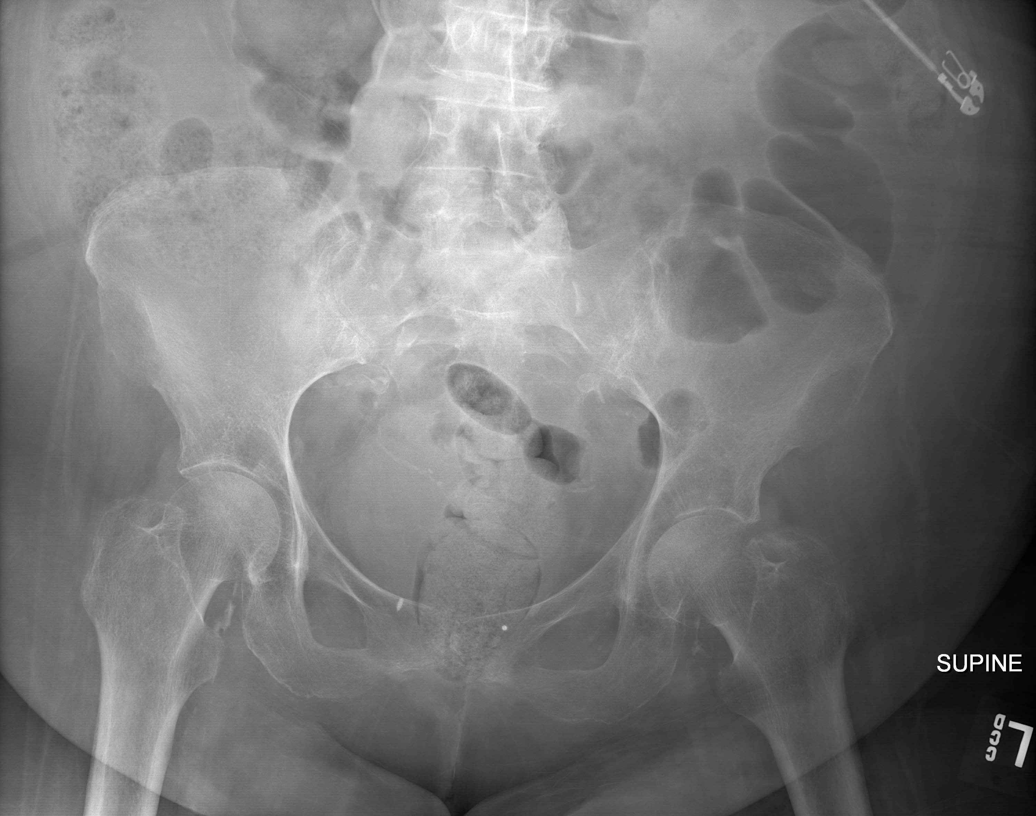

[x abdomen supine (2 of 2)]
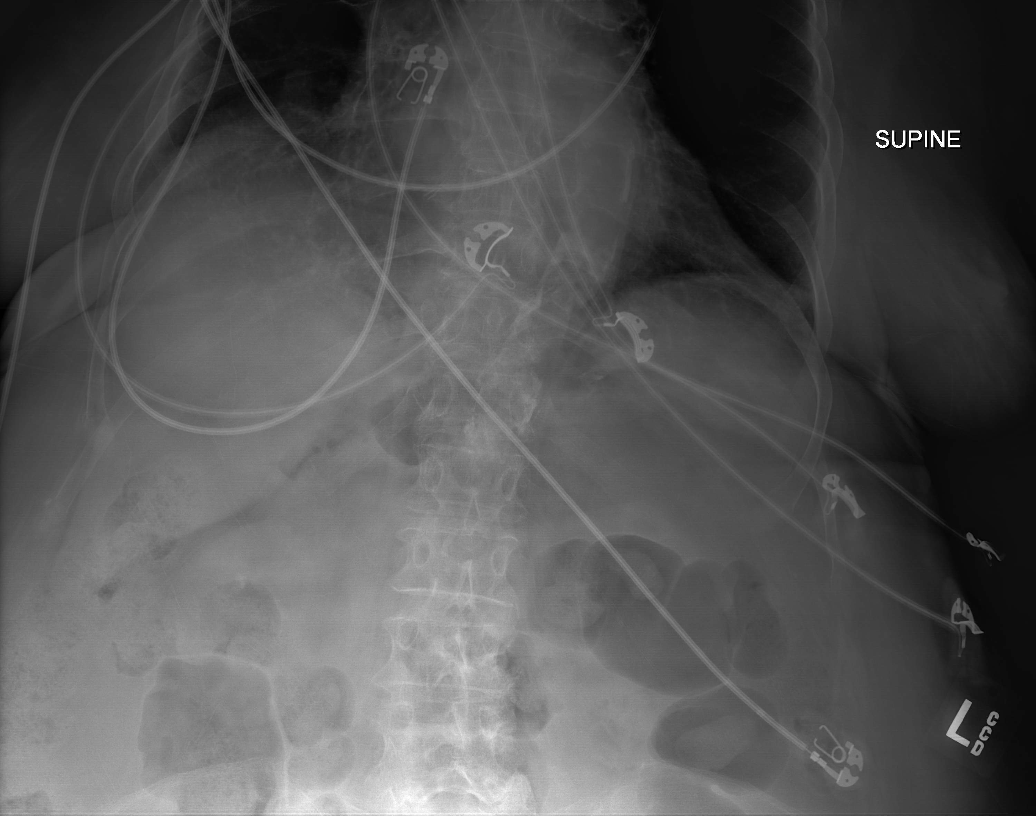

[w abdomen decub (1 of 2)]
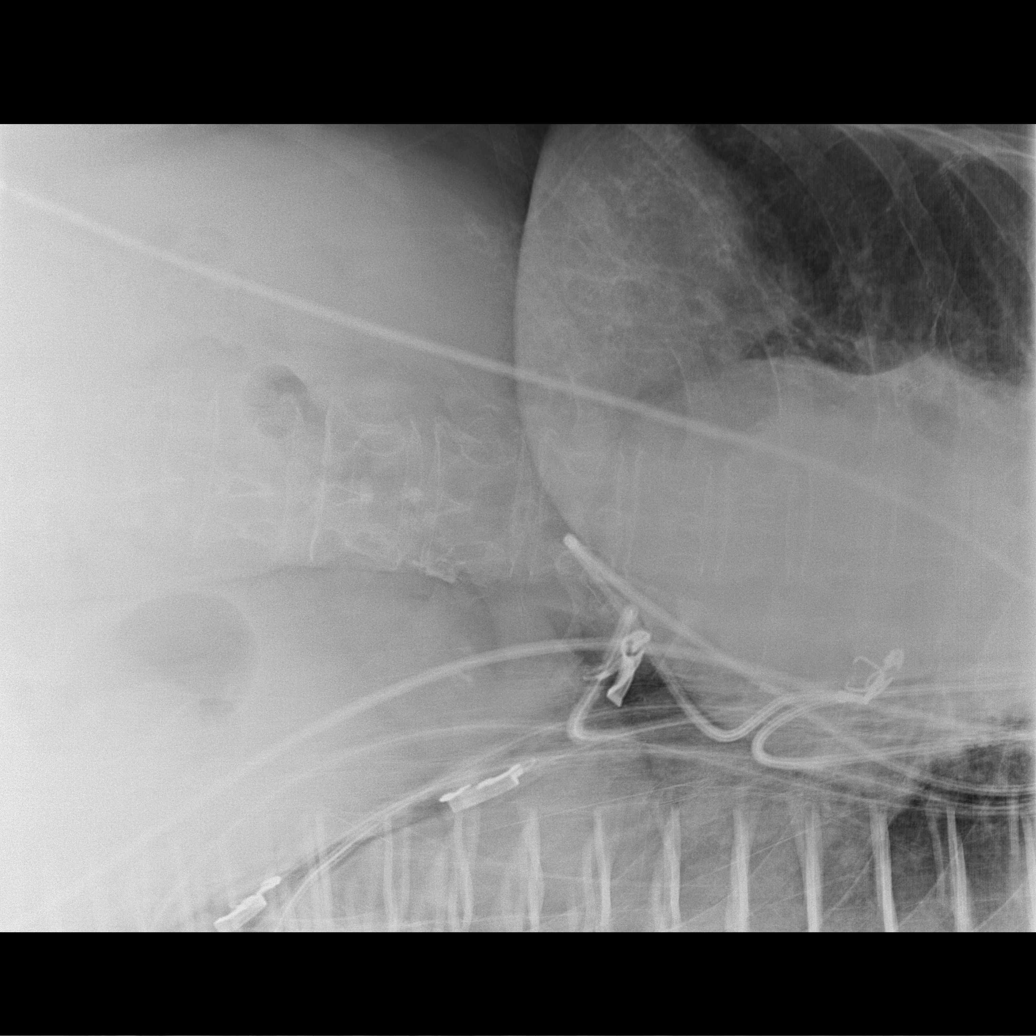

[w abdomen decub (2 of 2)]
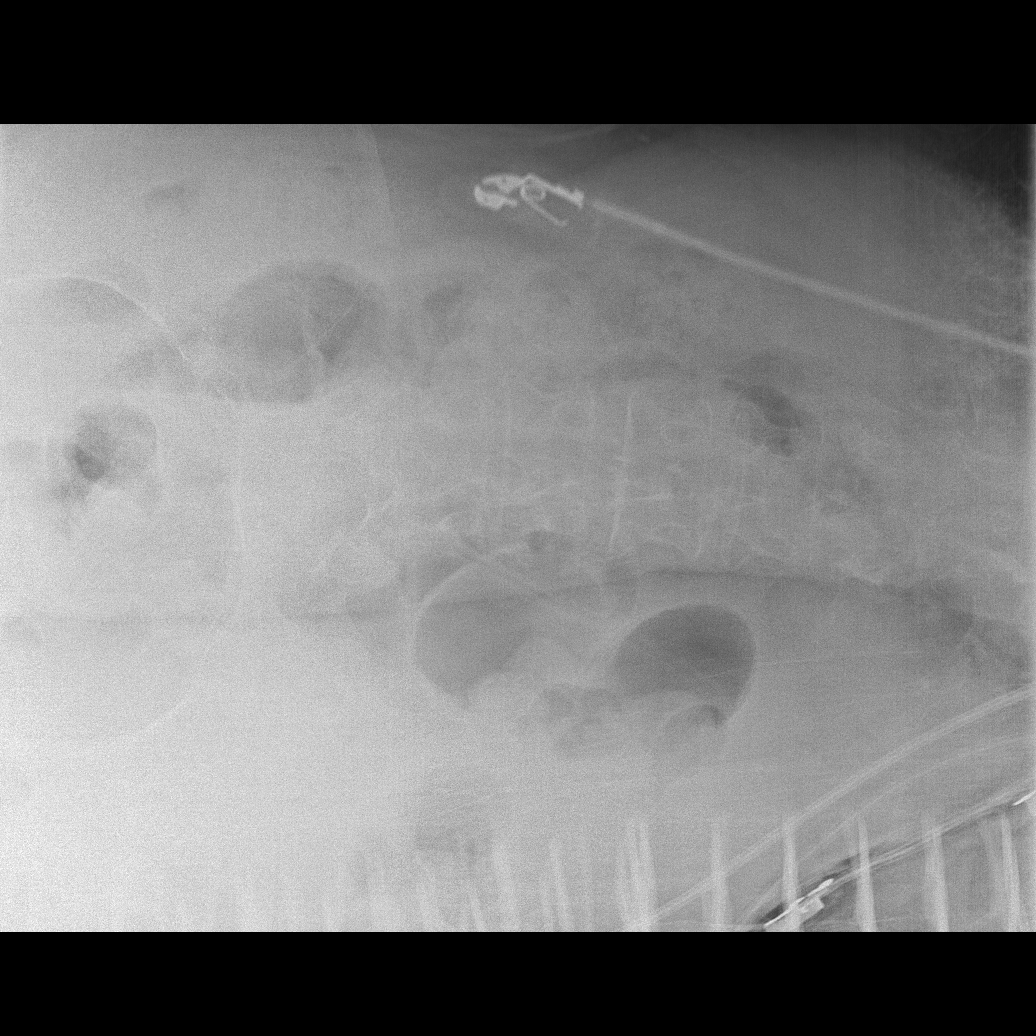

[5 of 5 positions shown; findings below may reference images not displayed]

FINDINGS: Moderate colonic stool mainly seen in the ascending colon and
rectum. No rectal impaction. No evidence of small bowel obstruction.
No concerning mass effect or gas collection.

Cardiomegaly. Aortic atherosclerosis. Indistinct right diaphragm
which is from fissural fat by recent CT. There is no edema,
consolidation, effusion, or pneumothorax.
IMPRESSION: No acute finding in the chest or abdomen.

## 2020-07-24 MED ORDER — MAGNESIUM HYDROXIDE 400 MG/5ML PO SUSP
30.0000 mL | Freq: Once | ORAL | Status: AC
  Administered 2020-07-24: 30 mL via ORAL
  Filled 2020-07-24: qty 30

## 2020-07-24 MED ORDER — SIMVASTATIN 20 MG PO TABS
20.0000 mg | ORAL_TABLET | Freq: Every day | ORAL | Status: DC
  Administered 2020-07-24 – 2020-07-27 (×4): 20 mg via ORAL
  Filled 2020-07-24 (×4): qty 1

## 2020-07-24 MED ORDER — SENNOSIDES-DOCUSATE SODIUM 8.6-50 MG PO TABS
1.0000 | ORAL_TABLET | Freq: Every day | ORAL | Status: DC
  Administered 2020-07-24: 1 via ORAL
  Filled 2020-07-24 (×3): qty 1

## 2020-07-24 MED ORDER — SODIUM CHLORIDE 0.9% IV SOLUTION: Freq: Once | INTRAVENOUS | Status: AC

## 2020-07-24 MED ORDER — METOPROLOL SUCCINATE ER 25 MG PO TB24
12.5000 mg | ORAL_TABLET | Freq: Every day | ORAL | Status: DC
  Administered 2020-07-24 – 2020-07-27 (×4): 12.5 mg via ORAL
  Filled 2020-07-24 (×4): qty 1

## 2020-07-24 MED ORDER — VITAMIN B-12 1000 MCG PO TABS
1000.0000 ug | ORAL_TABLET | Freq: Every day | ORAL | Status: DC
  Administered 2020-07-25 – 2020-07-28 (×4): 1000 ug via ORAL
  Filled 2020-07-24 (×4): qty 1

## 2020-07-24 MED ORDER — GABAPENTIN 100 MG PO CAPS
100.0000 mg | ORAL_CAPSULE | Freq: Every day | ORAL | Status: DC
  Administered 2020-07-24 – 2020-07-27 (×4): 100 mg via ORAL
  Filled 2020-07-24 (×4): qty 1

## 2020-07-24 MED ORDER — PROCHLORPERAZINE MALEATE 10 MG PO TABS: 10.0000 mg | ORAL_TABLET | Freq: Four times a day (QID) | ORAL | Status: DC | PRN

## 2020-07-24 MED ORDER — MIRABEGRON ER 25 MG PO TB24
25.0000 mg | ORAL_TABLET | Freq: Every day | ORAL | Status: DC
  Administered 2020-07-25 – 2020-07-28 (×4): 25 mg via ORAL
  Filled 2020-07-24 (×4): qty 1

## 2020-07-24 MED ORDER — TBO-FILGRASTIM 480 MCG/0.8ML ~~LOC~~ SOSY
480.0000 ug | PREFILLED_SYRINGE | Freq: Every day | SUBCUTANEOUS | Status: DC
  Administered 2020-07-24 – 2020-07-26 (×3): 480 ug via SUBCUTANEOUS
  Filled 2020-07-24 (×3): qty 0.8

## 2020-07-24 MED ORDER — TIMOLOL MALEATE 0.5 % OP SOLN
1.0000 [drp] | Freq: Every day | OPHTHALMIC | Status: DC
  Administered 2020-07-24 – 2020-07-27 (×4): 1 [drp] via OPHTHALMIC
  Filled 2020-07-24: qty 5

## 2020-07-24 MED ORDER — DICLOFENAC SODIUM 1 % EX GEL
4.0000 g | Freq: Four times a day (QID) | CUTANEOUS | Status: DC
  Administered 2020-07-24 – 2020-07-28 (×17): 4 g via TOPICAL
  Filled 2020-07-24: qty 100

## 2020-07-24 MED ORDER — POTASSIUM CHLORIDE CRYS ER 20 MEQ PO TBCR
40.0000 meq | EXTENDED_RELEASE_TABLET | Freq: Two times a day (BID) | ORAL | Status: DC
  Administered 2020-07-24 – 2020-07-28 (×8): 40 meq via ORAL
  Filled 2020-07-24 (×9): qty 2

## 2020-07-24 NOTE — ED Notes (Signed)
Leah Velasquez, daughter, [phone] please call with update.

## 2020-07-24 NOTE — ED Notes (Addendum)
Pt sts unable to control her left leg. Unable to tell writer how long she has had trouble standing and walking. When asked by writer when the last time she walked was, pt stated, "I don't know." Pt also told Probation officer, "They don't let me do anything," referring to the facility that she lives in. This Probation officer did not feel comfortable ambulating pt to bedside commode. Pt expressed preference to use bed pan.

## 2020-07-24 NOTE — Progress Notes (Signed)
CRITICAL VALUE ALERT  Critical Value:  Potassium 2.5  Date & Time Notied:  07/24/20 2330  Provider Notified: Kennon Holter, NP  Orders Received/Actions taken: aware, no new order rec'd

## 2020-07-24 NOTE — Progress Notes (Signed)
Patient BP checked manually, 176/100. Will have night shift recheck and notify for prn blood pressure medication if still high.

## 2020-07-24 NOTE — Progress Notes (Signed)
Brief oncology note  I was notified by the hospitalist that the patient was sent to the hospital from the skilled nursing facility.  She is currently receiving systemic treatment for limited stage small cell lung cancer.  She is receiving carboplatin for an AUC of 5 on day 1 with etoposide 100 mg/meter squared days 1 through 3 every 21 days.  She also receives G-CSF support with each cycle of chemotherapy. She started cycle 4 chemotherapy on 07/13/2000.  The patient was sent to the hospital due to abdominal pain.  KUB showed moderate stool burden.  Labs from admission have been reviewed.  CBC    Component Value Date/Time   WBC 1.2 (LL) 07/24/2020 0212   RBC 2.02 (L) 07/24/2020 0212   HGB 6.6 (LL) 07/24/2020 0212   HGB 9.4 (L) 07/13/2020 1315   HGB 13.7 12/26/2017 1145   HCT 20.3 (L) 07/24/2020 0212   HCT 40.0 12/26/2017 1145   PLT 17 (LL) 07/24/2020 0212   PLT 297 07/13/2020 1315   PLT 204 12/26/2017 1145   MCV 100.5 (H) 07/24/2020 0212   MCV 95 12/26/2017 1145   MCH 32.7 07/24/2020 0212   MCHC 32.5 07/24/2020 0212   RDW 17.9 (H) 07/24/2020 0212   RDW 14.1 12/26/2017 1145   LYMPHSABS 1.1 07/24/2020 0212   LYMPHSABS 1.4 12/26/2017 1145   MONOABS 0.0 (L) 07/24/2020 0212   EOSABS 0.0 07/24/2020 0212   EOSABS 0.2 12/26/2017 1145   BASOSABS 0.0 07/24/2020 0212   BASOSABS 0.0 12/26/2017 1145    Her WBC is 1.2, hemoglobin 6.6, platelets 17,000, and ANC is 0.0. She is afebrile.   Recommend starting Granix 480 micrograms subcu daily until [place] is over 1.0.  We also recommend transfusing 2 units PRBCs and 1 unit of platelets.  Our transfusion parameters will be to transfuse PRBCs for hemoglobin less than 8 and for platelets less than 20,000 or active bleeding.  The patient is on Coumadin for atrial relation.  Recommend holding Coumadin until platelet count is 50,000 or higher.  The patient may discharge back to the facility once her constipation is resolved, and ANC is above 1.0,  hemoglobin above 8, and platelets above 20,000.  The patient is already scheduled for follow-up labs in our office on Monday, 07/27/2020.  She should keep this appointment.  Please call Oncology over the weekend for questions.   Mikey Bussing, DNP, AGPCNP-BC, AOCNP Mon/Tues/Thurs/Fri 7am-5pm; Off Wednesdays Cell: [phone]

## 2020-07-24 NOTE — H&P (Signed)
History and Physical    Leah Velasquez EUM:[id number] DOB: [birth year] DOA: 07/24/2020  PCP: [name], MD  Patient coming from: [facility]  Chief Complaint: abdominal pain  HPI: Leah Velasquez is a 79 y.o. female with medical history significant of lung cancer. Presenting with abdominal pain. Reports that she's had abdominal pain for the last week. It's RUQ radiating to LUQ. 5 minute episodes. Sharp. Partially relieved with TUMS. She states that it seems to be worsened when she isn't allowed to go to the bathroom to have a BM. She reports no other alleviating or aggravating factors. She reports no other treatments for this problem. She tells me that she had some blood work recently because she is on chemotherapy for lung cancer. She states that the people at [facility] were worried about the results and sent her to the ED.    ED Course: She was found to be pancytopenic. KUB showed moderate stool burden. TRH was called for admission.   Review of Systems:  Denies CP, dyspnea, palpitations, HA, syncopal episodes. Review of systems is otherwise negative for all not mentioned in HPI.   PMHx Past Medical History:  Diagnosis Date  . A-fib ([place])   . Dysrhythmia   . GERD (gastroesophageal reflux disease)   . Hemorrhoids   . Hyperlipidemia   . Hypertension   . Lung cancer ([place]) 2000   RLL  . Obesity   . Persistent atrial fibrillation ([place])   . Rheumatoid arthritis ([place])     PSHx Past Surgical History:  Procedure Laterality Date  . BREAST EXCISIONAL BIOPSY Left    x 2  . BREAST EXCISIONAL BIOPSY Right   . BRONCHIAL BIOPSY  02/18/2020   Procedure: BRONCHIAL BIOPSIES;  Surgeon: Collene Gobble, MD;  Location: [facility] ENDOSCOPY;  Service: Cardiopulmonary;;  . BRONCHIAL BRUSHINGS  02/18/2020   Procedure: BRONCHIAL BRUSHINGS;  Surgeon: Collene Gobble, MD;  Location: [place];  Service: Cardiopulmonary;;  . BRONCHIAL NEEDLE ASPIRATION BIOPSY  02/18/2020   Procedure: BRONCHIAL NEEDLE  ASPIRATION BIOPSIES;  Surgeon: Collene Gobble, MD;  Location: [place];  Service: Cardiopulmonary;;  . BRONCHIAL WASHINGS  02/18/2020   Procedure: BRONCHIAL WASHINGS;  Surgeon: Collene Gobble, MD;  Location: [place];  Service: Cardiopulmonary;;  . CARDIOVERSION N/A 01/09/2018   Procedure: CARDIOVERSION;  Surgeon: Josue Hector, MD;  Location[facility] END[facility]COPY;  Service: Cardiovascular;  Laterality: N/A;  . CARDIOVERSION N/A 03/01/2018   Procedure: CARDIOVERSION;  Surgeon: Sueanne Margarita, MD;  Location: [facility] ENDOSCOPY;  Service: Cardiovascular;  Laterality: N/A;  . ELECTROMAGNETIC NAVIGATION BROCHOSCOPY N/A 02/18/2020   Procedure: ELECTROMAGNETIC NAVIGATION BRONCHOSCOPY;  Surgeon: Collene Gobble, MD;  Location: [place];  Service: Cardiopulmonary;  Laterality: N/A;  . KNEE SURGERY    . LUNG LOBECTOMY Right 2000   RLL removed  . VIDEO BRONCHOSCOPY N/A 02/18/2020   Procedure: VIDEO BRONCHOSCOPY WITH FLUORO;  Surgeon: Collene Gobble, MD;  Location: [facility] ENDOSCOPY;  Service: Cardiopulmonary;  Laterality: N/A;  . VIDEO BRONCHOSCOPY WITH ENDOBRONCHIAL ULTRASOUND N/A 04/22/2020   Procedure: VIDEO BRONCHOSCOPY WITH ENDOBRONCHIAL ULTRASOUND;  Surgeon: [name], MD;  Location: [place];  Service: Thoracic;  Laterality: N/A;    SocHx  reports that she has quit smoking. She has never used smokeless tobacco. She reports that she does not drink alcohol and does not use drugs.  Allergies  Allergen Reactions  . Morphine Anaphylaxis  . Morphine And Related Other (See Comments)    "vitals go down", pass out  .  Sulfa Antibiotics Rash    FamHx No family history on file.  Prior to Admission medications   Medication Sig Start Date End Date Taking? Authorizing Provider  acetaminophen (TYLENOL) 325 MG tablet Take 650 mg by mouth every 6 (six) hours as needed for mild pain.   Yes [provider]  bisacodyl (DULCOLAX) 10 MG suppository Place 10 mg rectally See admin instructions. If  not relieved by MOM give 10mg  Bisacodyl supp. Rectally for x1 dose in 24 hours prn constipation    Yes [provider]  diclofenac Sodium (VOLTAREN) 1 % GEL Apply 4 g topically 4 (four) times daily. To bilateral knees and ankles 04/24/20  Yes Reed, Tiffany L, DO  gabapentin (NEURONTIN) 100 MG capsule Take 100 mg by mouth at bedtime.   Yes [provider]  levocetirizine (XYZAL) 5 MG tablet Take 5 mg by mouth daily.  01/28/20  Yes [provider]  metoprolol succinate (TOPROL-XL) 25 MG 24 hr tablet Take 12.5 mg by mouth at bedtime.  12/18/19  Yes [provider]  MYRBETRIQ 25 MG TB24 tablet Take 25 mg by mouth daily. 06/09/20  Yes [provider]  NON FORMULARY Initiate 4oz Medpass qd d/t weight loss (prefers butter pecan). 07/07/20  Yes [provider]  prochlorperazine (COMPAZINE) 10 MG tablet Take 10 mg by mouth every 6 (six) hours as needed for nausea or vomiting.   Yes [provider]  simvastatin (ZOCOR) 20 MG tablet Take 20 mg by mouth at bedtime.   Yes [provider]  Sodium Phosphates (RA SALINE ENEMA RE) Place 1 Container rectally See admin instructions. If not relieved by bisacodyl supp. Give disposable saline enema rectally x1 dose 24 hours prn constipation    Yes [provider]  timolol (TIMOPTIC) 0.5 % ophthalmic solution Place 1 drop into both eyes at bedtime.  11/03/19  Yes [provider]  vitamin B-12 1000 MCG tablet Take 1 tablet (1,000 mcg total) by mouth daily. 02/20/20  Yes Andrew Au, MD  warfarin (COUMADIN) 2.5 MG tablet Take 2.5 mg by mouth daily.   Yes [provider]  OXYGEN Inhale 2 L into the lungs as needed (to maintain O2 saturation of 90%).    [provider]    Physical Exam: Vitals:   07/24/20 0215 07/24/20 0428 07/24/20 0530  BP: 126/71 139/71 134/87  Pulse: (!) 101 (!) 107 97  Resp: 18 20 (!) 22  Temp: 98.7 F (37.1 C) 97.9 F (36.6 C)   TempSrc: Oral  Oral   SpO2: 96% 98% 96%  Weight: 93 kg    Height: 5\' 1"  (1.549 m)      General: 79 y.o. female resting in bed in NAD Eyes: PERRL, normal sclera ENMT: Nares patent w/o discharge, orophaynx clear, dentition normal, ears w/o discharge/lesions/ulcers Neck: Supple, trachea midline Cardiovascular: RRR, +S1, S2, no m/g/r, equal pulses throughout Respiratory: CTABL, no w/r/r, normal WOB GI: BS+, ND, RUQ TTP mildly, no masses noted, no organomegaly noted MSK: No e/c/c Skin: No rashes, bruises, ulcerations noted Neuro: A&O x 3, no focal deficits Psyc: Appropriate interaction and affect, calm/cooperative  Labs on Admission: I have personally reviewed following labs and imaging studies  CBC: Recent Labs  Lab 07/24/20 0212  WBC 1.2*  NEUTROABS 0.0*  HGB 6.6*  HCT 20.3*  MCV 100.5*  PLT 17*   Basic Metabolic Panel: Recent Labs  Lab 07/24/20 0212  NA 136  K 3.1*  CL 97*  CO2 27  GLUCOSE 100*  BUN 12  CREATININE 0.34*  CALCIUM 8.0*   GFR: Estimated Creatinine Clearance: 61.3 mL/min (A) (by C-G formula based on SCr of 0.34 mg/dL (L)). Liver Function Tests: Recent Labs  Lab 07/24/20 0212  AST 16  ALT 13  ALKPHOS 65  BILITOT 1.4*  PROT 6.2*  ALBUMIN 2.9*   No results for input(s): LIPASE, AMYLASE in the last 168 hours. No results for input(s): AMMONIA in the last 168 hours. Coagulation Profile: No results for input(s): INR, PROTIME in the last 168 hours. Cardiac Enzymes: No results for input(s): CKTOTAL, CKMB, CKMBINDEX, TROPONINI in the last 168 hours. BNP (last 3 results) No results for input(s): PROBNP in the last 8760 hours. HbA1C: No results for input(s): HGBA1C in the last 72 hours. CBG: No results for input(s): GLUCAP in the last 168 hours. Lipid Profile: No results for input(s): CHOL, HDL, LDLCALC, TRIG, CHOLHDL, LDLDIRECT in the last 72 hours. Thyroid Function Tests: No results for input(s): TSH, T4TOTAL, FREET4, T3FREE, THYROIDAB in the last 72  hours. Anemia Panel: No results for input(s): VITAMINB12, FOLATE, FERRITIN, TIBC, IRON, RETICCTPCT in the last 72 hours. Urine analysis:    Component Value Date/Time   COLORURINE YELLOW 05/11/2020 1957   APPEARANCEUR CLOUDY (A) 05/11/2020 1957   LABSPEC 1.010 05/11/2020 1957   PHURINE 7.0 05/11/2020 1957   GLUCOSEU NEGATIVE 05/11/2020 1957   HGBUR MODERATE (A) 05/11/2020 [place] NEGATIVE 05/11/2020 [place] NEGATIVE 05/11/2020 1957   PROTEINUR 30 (A) 05/11/2020 1957   NITRITE NEGATIVE 05/11/2020 1957   LEUKOCYTESUR LARGE (A) 05/11/2020 1957    Radiological Exams on Admission: No results found.  Assessment/Plan Pancytopenia     - admit to obs, tele     - transfuse 2 units pRBCS, 1 unit plts; add granix 480 mcg til ANC is greater than 1000     - follow AM labs     - oncology (Dr. Julien Nordmann) is aware and will follow     - no evidence of bleed, follow  Hx of lung CA; on chemo     - oncology to follow  Chronic hypoxic respiratory failure     - on 2L [place] at home, continue  A fib     - metoprolol     - hold her coumadin until we reassess her Hgb after transfusion; possibly resume in AM  HLD     - simavastatin  Constipation     - lactulose  DVT prophylaxis: SCDs  Code Status: FULL  Family Communication: None at bedside.   Consults called: Oncology (Dr. Julien Nordmann)  Status is: Observation  The patient remains OBS appropriate and will d/c before 2 midnights.  Dispo: The patient is from: Home              Anticipated d/c is to: Home              Anticipated d/c date is: 1 day              Patient currently is not medically stable to d/c.  Jonnie Finner DO [facility]  If 7PM-7AM, please contact night-coverage [email or web address]  07/24/2020, 7:34 AM

## 2020-07-24 NOTE — ED Notes (Signed)
Report given to Chelsea RN

## 2020-07-24 NOTE — ED Provider Notes (Signed)
[facility] DEPT Provider Note  CSN: [id number] Arrival date & time: 07/24/20 0147  Chief Complaint(s) No chief complaint on file.  HPI Leah Velasquez is a 79 y.o. 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 Patient endorses fatigue. Denies any chest pain or shortness of breath. Endorses some nausea without emesis. Also reports abdominal discomfort related to inability to have a bowel movement which she attributes to the facility not allowing her to get up to go to the restroom due to her fall risk.   Patient is currently on Coumadin for A. fib. She denies any bloody bowel movements.  HPI  Past Medical History Past Medical History:  Diagnosis Date  . A-fib ([place])   . Dysrhythmia   . GERD (gastroesophageal reflux disease)   . Hemorrhoids   . Hyperlipidemia   . Hypertension   . Lung cancer ([place]) 2000   RLL  . Obesity   . Persistent atrial fibrillation ([place])   . Rheumatoid arthritis [facility])    Patient Active Problem List   Diagnosis Date Noted  . Altered mental status 05/11/2020  . History of memory loss 05/11/2020  . Encounter for antineoplastic chemotherapy 05/04/2020  . Goals of care, counseling/discussion 05/04/2020  . Small cell lung cancer, right upper lobe ([place]) 04/24/2020  . Rheumatoid arthritis ([place]) 04/24/2020  . Primary osteoarthritis of both knees 04/24/2020  . Non-traumatic rhabdomyolysis   . Non-small cell carcinoma of right lung, stage 3 ([place]) 02/27/2020  . Paresthesias 02/15/2020  . Frequent falls   . Lung mass 02/14/2020  . Long term (current) use of anticoagulants 06/04/2018  . Persistent atrial fibrillation ([place]) 10/22/2017  . Generalized weakness 10/22/2017  . Essential hypertension 10/22/2017  . Thrombocytopenia ([place]) 10/22/2017  . Hyperlipidemia 10/22/2017   Home  Medication(s) Prior to Admission medications   Medication Sig Start Date End Date Taking? Authorizing Provider  acetaminophen (TYLENOL) 325 MG tablet Take 650 mg by mouth every 6 (six) hours as needed for mild pain.   Yes [provider]  bisacodyl (DULCOLAX) 10 MG suppository Place 10 mg rectally See admin instructions. If not relieved by MOM give 10mg  Bisacodyl supp. Rectally for x1 dose in 24 hours prn constipation    Yes [provider]  diclofenac Sodium (VOLTAREN) 1 % GEL Apply 4 g topically 4 (four) times daily. To bilateral knees and ankles 04/24/20  Yes Reed, Tiffany L, DO  gabapentin (NEURONTIN) 100 MG capsule Take 100 mg by mouth at bedtime.   Yes [provider]  levocetirizine (XYZAL) 5 MG tablet Take 5 mg by mouth daily.  01/28/20  Yes [provider]  metoprolol succinate (TOPROL-XL) 25 MG 24 hr tablet Take 12.5 mg by mouth at bedtime.  12/18/19  Yes [provider]  MYRBETRIQ 25 MG TB24 tablet Take 25 mg by mouth daily. 06/09/20  Yes [provider]  NON FORMULARY Initiate 4oz Medpass qd d/t weight loss (prefers butter pecan). 07/07/20  Yes [provider]  prochlorperazine (COMPAZINE) 10 MG tablet Take 10 mg by mouth every 6 (six) hours as needed for nausea or vomiting.   Yes [provider]  simvastatin (ZOCOR) 20 MG tablet Take 20 mg by mouth at bedtime.   Yes [provider]  Sodium Phosphates (RA SALINE ENEMA RE) Place 1 Container rectally See admin instructions. If not relieved by bisacodyl supp. Give disposable saline  enema rectally x1 dose 24 hours prn constipation    Yes [provider]  timolol (TIMOPTIC) 0.5 % ophthalmic solution Place 1 drop into both eyes at bedtime.  11/03/19  Yes [provider]  vitamin B-12 1000 MCG tablet Take 1 tablet (1,000 mcg total) by mouth daily. 02/20/20  Yes Andrew Au, MD  warfarin (COUMADIN) 2.5 MG tablet Take 2.5 mg by mouth daily.   Yes  [provider]  OXYGEN Inhale 2 L into the lungs as needed (to maintain O2 saturation of 90%).    [provider]                                                                                                                                    Past Surgical History Past Surgical History:  Procedure Laterality Date  . BREAST EXCISIONAL BIOPSY Left    x 2  . BREAST EXCISIONAL BIOPSY Right   . BRONCHIAL BIOPSY  02/18/2020   Procedure: BRONCHIAL BIOPSIES;  Surgeon: Collene Gobble, MD;  Location: [facility] ENDOSCOPY;  Service: Cardiopulmonary;;  . BRONCHIAL BRUSHINGS  02/18/2020   Procedure: BRONCHIAL BRUSHINGS;  Surgeon: Collene Gobble, MD;  Location: [place];  Service: Cardiopulmonary;;  . BRONCHIAL NEEDLE ASPIRATION BIOPSY  02/18/2020   Procedure: BRONCHIAL NEEDLE ASPIRATION BIOPSIES;  Surgeon: Collene Gobble, MD;  Location: [place];  Service: Cardiopulmonary;;  . BRONCHIAL WASHINGS  02/18/2020   Procedure: BRONCHIAL WASHINGS;  Surgeon: Collene Gobble, MD;  Location: [place];  Service: Cardiopulmonary;;  . CARDIOVERSION N/A 01/09/2018   Procedure: CARDIOVERSION;  Surgeon: Josue Hector, MD;  Location: [facility] ENDOSCOPY;  Service: Cardiovascular;  Laterality: N/A;  . CARDIOVERSION N/A 03/01/2018   Procedure: CARDIOVERSION;  Surgeon: Sueanne Margarita, MD;  Location: [facility] ENDOSCOPY;  Service: Cardiovascular;  Laterality: N/A;  . ELECTROMAGNETIC NAVIGATION BROCHOSCOPY N/A 02/18/2020   Procedure: ELECTROMAGNETIC NAVIGATION BRONCHOSCOPY;  Surgeon: Collene Gobble, MD;  Location: [place];  Service: Cardiopulmonary;  Laterality: N/A;  . KNEE SURGERY    . LUNG LOBECTOMY Right 2000   RLL removed  . VIDEO BRONCHOSCOPY N/A 02/18/2020   Procedure: VIDEO BRONCHOSCOPY WITH FLUORO;  Surgeon: Collene Gobble, MD;  Location: [facility] ENDOSCOPY;  Service: Cardiopulmonary;  Laterality: N/A;  . VIDEO BRONCHOSCOPY WITH ENDOBRONCHIAL ULTRASOUND N/A 04/22/2020   Procedure: VIDEO BRONCHOSCOPY WITH  ENDOBRONCHIAL ULTRASOUND;  Surgeon: [name], MD;  Location: [place];  Service: Thoracic;  Laterality: N/A;   Family History No family history on file.  Social History Social History   Tobacco Use  . Smoking status: Former Research scientist (life sciences)  . Smokeless tobacco: Never Used  Vaping Use  . Vaping Use: Never used  Substance Use Topics  . Alcohol use: No  . Drug use: No   Allergies Morphine, Morphine and related, and Sulfa antibiotics  Review of Systems Review of Systems All other systems are reviewed and are negative for acute change except as  noted in the HPI  Physical Exam Vital Signs  I have reviewed the triage vital signs BP 139/71 (BP Location: Right Arm)   Pulse (!) 107   Temp 97.9 F (36.6 C) (Oral)   Resp 20   Ht 5\' 1"  (1.549 m)   Wt 93 kg   SpO2 98%   BMI 38.73 kg/m   Physical Exam Vitals reviewed.  Constitutional:      General: She is not in acute distress.    Appearance: She is well-developed. She is not diaphoretic.  HENT:     Head: Normocephalic and atraumatic.     Nose: Nose normal.  Eyes:     General: No scleral icterus.       Right eye: No discharge.        Left eye: No discharge.     Conjunctiva/sclera: Conjunctivae normal.     Pupils: Pupils are equal, round, and reactive to light.  Cardiovascular:     Rate and Rhythm: Normal rate and regular rhythm.     Heart sounds: No murmur heard.  No friction rub. No gallop.   Pulmonary:     Effort: Pulmonary effort is normal. No respiratory distress.     Breath sounds: Normal breath sounds. No stridor. No rales.  Abdominal:     General: There is no distension.     Palpations: Abdomen is soft.     Tenderness: There is generalized abdominal tenderness (mild discomfort). There is no guarding or rebound.  Musculoskeletal:        General: No tenderness.     Cervical back: Normal range of motion and neck supple.  Skin:    General: Skin is warm and dry.     Coloration: Skin is pale.     Findings: No  erythema or rash.  Neurological:     Mental Status: She is alert and oriented to person, place, and time.     ED Results and Treatments Labs (all labs ordered are listed, but only abnormal results are displayed) Labs Reviewed  COMPREHENSIVE METABOLIC PANEL - Abnormal; Notable for the following components:      Result Value   Potassium 3.1 (*)    Chloride 97 (*)    Glucose, Bld 100 (*)    Creatinine, Ser 0.34 (*)    Calcium 8.0 (*)    Total Protein 6.2 (*)    Albumin 2.9 (*)    Total Bilirubin 1.4 (*)    All other components within normal limits  CBC - Abnormal; Notable for the following components:   WBC 1.2 (*)    RBC 2.02 (*)    Hemoglobin 6.6 (*)    HCT 20.3 (*)    MCV 100.5 (*)    RDW 17.9 (*)    Platelets 17 (*)    All other components within normal limits  RESP PANEL BY RT-PCR (FLU A&B, COVID) [id number]  PROTIME-INR  DIFFERENTIAL  POC OCCULT BLOOD, ED  TYPE AND SCREEN  EKG  EKG Interpretation  Date/Time:    Ventricular Rate:    PR Interval:    QRS Duration:   QT Interval:    QTC Calculation:   R Axis:     Text Interpretation:        Radiology No results found.  Pertinent labs & imaging results that were available during my care of the patient were reviewed by me and considered in my medical decision making (see chart for details).  Medications Ordered in ED Medications - No data to display                                                                                                                                  Procedures .1-3 Lead EKG Interpretation Performed by: Fatima Blank, MD Authorized by: Fatima Blank, MD     Interpretation: normal     ECG rate:  102   ECG rate assessment: tachycardic     Rhythm: sinus rhythm     Ectopy: none     Conduction: normal   .Critical Care Performed by: Fatima Blank, MD Authorized by: Fatima Blank, MD   Critical care provider statement:    Critical care time (minutes):  45   Critical care was necessary to treat or prevent imminent or life-threatening deterioration of the following conditions: hematologic crisis (pancytopenia)   Critical care was time spent personally by me on the following activities:  Discussions with consultants, evaluation of patient's response to treatment, examination of patient, ordering and performing treatments and interventions, ordering and review of laboratory studies, ordering and review of radiographic studies, pulse oximetry, re-evaluation of patient's condition, obtaining history from patient or surrogate and review of old charts    (including critical care time)  Medical Decision Making / ED Course I have reviewed the nursing notes for this encounter and the patient's prior records (if available in EHR or on provided paperwork).   Leah Velasquez was evaluated in Emergency Department on 07/24/2020 for the symptoms described in the history of present illness. She was evaluated in the context of the global COVID-19 pandemic, which necessitated consideration that the patient might be at risk for infection with the SARS-CoV-2 virus that causes COVID-19. Institutional protocols and algorithms that pertain to the evaluation of patients at risk for COVID-19 are in a state of rapid change based on information released by regulatory bodies including the CDC and federal and state organizations. These policies and algorithms were followed during the patient's care in the ED.  Patient's labs are notable for pancytopenia, new when compared to labs drawn 11 days ago. Likely related to chemotherapy. Patient's last infusion was on November 26. Patient receives etoposide and pelfilgrastim. Currently patient is slightly tachycardic. No chest pain or shortness of breath but is fatigued. Reports that she feels like having a  bowel movement. Will obtain Hemoccult with her bowel movement. Will refrain from performing a DRE  given her neutropenia.  Patient will require admission to the hospital for blood transfusion to which she is unable to. Case discussed with Dr. Alcario Drought from hospitalist service who will admit the patient for further management.      Final Clinical Impression(s) / ED Diagnoses Final diagnoses:  Abdominal discomfort  Pancytopenia ([place])      This chart was dictated using voice recognition software.  Despite best efforts to proofread,  errors can occur which can change the documentation meaning.   Fatima Blank, MD 07/24/20 [phone]

## 2020-07-24 NOTE — ED Triage Notes (Signed)
Pt BIB EMS from [facility] for abnormal labs. PCP reports a Hgb 6.4 and a WBC of 1.2. Also c/o lower abdominal pain and constipation x 1 week. Denies dizziness/lightheadness, syncopal episodes, chest pain, shob.

## 2020-07-25 DIAGNOSIS — E785 Hyperlipidemia, unspecified: Secondary | ICD-10-CM | POA: Diagnosis present

## 2020-07-25 DIAGNOSIS — Z885 Allergy status to narcotic agent status: Secondary | ICD-10-CM | POA: Diagnosis not present

## 2020-07-25 DIAGNOSIS — K5900 Constipation, unspecified: Secondary | ICD-10-CM | POA: Diagnosis present

## 2020-07-25 DIAGNOSIS — R109 Unspecified abdominal pain: Secondary | ICD-10-CM | POA: Diagnosis present

## 2020-07-25 DIAGNOSIS — Z7901 Long term (current) use of anticoagulants: Secondary | ICD-10-CM | POA: Diagnosis not present

## 2020-07-25 DIAGNOSIS — Z9981 Dependence on supplemental oxygen: Secondary | ICD-10-CM | POA: Diagnosis not present

## 2020-07-25 DIAGNOSIS — K219 Gastro-esophageal reflux disease without esophagitis: Secondary | ICD-10-CM | POA: Diagnosis present

## 2020-07-25 DIAGNOSIS — M069 Rheumatoid arthritis, unspecified: Secondary | ICD-10-CM | POA: Diagnosis present

## 2020-07-25 DIAGNOSIS — E876 Hypokalemia: Secondary | ICD-10-CM | POA: Diagnosis present

## 2020-07-25 DIAGNOSIS — D61818 Other pancytopenia: Secondary | ICD-10-CM | POA: Diagnosis present

## 2020-07-25 DIAGNOSIS — C3490 Malignant neoplasm of unspecified part of unspecified bronchus or lung: Secondary | ICD-10-CM | POA: Diagnosis present

## 2020-07-25 DIAGNOSIS — Z79899 Other long term (current) drug therapy: Secondary | ICD-10-CM | POA: Diagnosis not present

## 2020-07-25 DIAGNOSIS — Z882 Allergy status to sulfonamides status: Secondary | ICD-10-CM | POA: Diagnosis not present

## 2020-07-25 DIAGNOSIS — Z20822 Contact with and (suspected) exposure to covid-19: Secondary | ICD-10-CM | POA: Diagnosis present

## 2020-07-25 DIAGNOSIS — Z87891 Personal history of nicotine dependence: Secondary | ICD-10-CM | POA: Diagnosis not present

## 2020-07-25 DIAGNOSIS — I4819 Other persistent atrial fibrillation: Secondary | ICD-10-CM | POA: Diagnosis present

## 2020-07-25 DIAGNOSIS — J9611 Chronic respiratory failure with hypoxia: Secondary | ICD-10-CM | POA: Diagnosis present

## 2020-07-25 DIAGNOSIS — I10 Essential (primary) hypertension: Secondary | ICD-10-CM | POA: Diagnosis present

## 2020-07-25 DIAGNOSIS — Z23 Encounter for immunization: Secondary | ICD-10-CM | POA: Diagnosis present

## 2020-07-25 LAB — COMPREHENSIVE METABOLIC PANEL
ALT: 13 U/L (ref 0–44)
ALT: 13 U/L (ref 0–44)
AST: 16 U/L (ref 15–41)
AST: 13 U/L — ABNORMAL LOW (ref 15–41)
Albumin: 2.7 g/dL — ABNORMAL LOW (ref 3.5–5.0)
Albumin: 2.7 g/dL — ABNORMAL LOW (ref 3.5–5.0)
Alkaline Phosphatase: 68 U/L (ref 38–126)
Alkaline Phosphatase: 68 U/L (ref 38–126)
Anion gap: 11 (ref 5–15)
Anion gap: 11 (ref 5–15)
BUN: 15 mg/dL (ref 8–23)
BUN: 14 mg/dL (ref 8–23)
CO2: 27 mmol/L (ref 22–32)
CO2: 25 mmol/L (ref 22–32)
Calcium: 7.7 mg/dL — ABNORMAL LOW (ref 8.9–10.3)
Calcium: 7.6 mg/dL — ABNORMAL LOW (ref 8.9–10.3)
Chloride: 99 mmol/L (ref 98–111)
Chloride: 99 mmol/L (ref 98–111)
Creatinine, Ser: 0.37 mg/dL — ABNORMAL LOW (ref 0.44–1.00)
Creatinine, Ser: 0.34 mg/dL — ABNORMAL LOW (ref 0.44–1.00)
GFR, Estimated: >60 mL/min (ref >60)
GFR, Estimated: >60 mL/min (ref >60)
Glucose, Bld: 112 mg/dL — ABNORMAL HIGH (ref 70–99)
Glucose, Bld: 100 mg/dL — ABNORMAL HIGH (ref 70–99)
Potassium: 2.5 mmol/L — CL (ref 3.5–5.1)
Potassium: 2.7 mmol/L — CL (ref 3.5–5.1)
Sodium: 140 mmol/L (ref 135–145)
Sodium: 135 mmol/L (ref 135–145)
Total Bilirubin: 1.7 mg/dL — ABNORMAL HIGH (ref 0.3–1.2)
Total Bilirubin: 1.5 mg/dL — ABNORMAL HIGH (ref 0.3–1.2)
Total Protein: 5.8 g/dL — ABNORMAL LOW (ref 6.5–8.1)
Total Protein: 5.6 g/dL — ABNORMAL LOW (ref 6.5–8.1)

## 2020-07-25 LAB — PREPARE PLATELET PHERESIS: Unit division: 0

## 2020-07-25 LAB — HEMOGLOBIN AND HEMATOCRIT, BLOOD
HCT: 24.8 % — ABNORMAL LOW (ref 36.0–46.0)
HCT: 26.1 % — ABNORMAL LOW (ref 36.0–46.0)
HCT: 24.9 % — ABNORMAL LOW (ref 36.0–46.0)
Hemoglobin: 8.3 g/dL — ABNORMAL LOW (ref 12.0–15.0)
Hemoglobin: 8.5 g/dL — ABNORMAL LOW (ref 12.0–15.0)
Hemoglobin: 8.7 g/dL — ABNORMAL LOW (ref 12.0–15.0)

## 2020-07-25 LAB — BPAM PLATELET PHERESIS
Blood Product Expiration Date: 202112052359
ISSUE DATE / TIME: 202112031739
Unit Type and Rh: 8400

## 2020-07-25 MED ORDER — MAGNESIUM SULFATE 2 GM/50ML IV SOLN
2.0000 g | Freq: Once | INTRAVENOUS | Status: AC
  Administered 2020-07-25: 2 g via INTRAVENOUS
  Filled 2020-07-25: qty 50

## 2020-07-25 MED ORDER — POTASSIUM CHLORIDE 10 MEQ/100ML IV SOLN
10.0000 meq | INTRAVENOUS | Status: AC
  Administered 2020-07-25 (×4): 10 meq via INTRAVENOUS
  Filled 2020-07-25 (×3): qty 100

## 2020-07-25 NOTE — Progress Notes (Signed)
CRITICAL VALUE ALERT  Critical Value:  Potassium 2.7  Date & Time Notied: 07/25/20 0201  Provider Notified: Kennon Holter, NP  Orders Received/Actions taken: aware, no new order rec'd

## 2020-07-25 NOTE — Progress Notes (Signed)
PROGRESS NOTE    Leah Velasquez  ZOX:[id number] DOB: Mar 23, 1941 DOA: 07/24/2020 PCP: [name], MD   Brief Narrative:   Leah Velasquez is a 79 y.o. female with medical history significant of lung cancer. Presenting with abdominal pain. Reports that she's had abdominal pain for the last week. It's RUQ radiating to LUQ. 5 minute episodes. Sharp. Partially relieved with TUMS. She states that it seems to be worsened when she isn't allowed to go to the bathroom to have a BM. She reports no other alleviating or aggravating factors. She reports no other treatments for this problem. She tells me that she had some blood work recently because she is on chemotherapy for lung cancer. She states that the people at [facility] were worried about the results and sent her to the ED.    12/4: good response to pRBCs last night, awaiting this morning's labs. She's stable. Continue granix. Mg2+/K+ low. Replace. Follow.  Assessment & Plan:  Pancytopenia     - s/p transfusion of 2 units pRBCS, 1 unit plts; receiving granix 480 mcg til ANC is greater than 1000     - awaiting rpt CBC     - onco onboard     - no evidence of bleed, follow     - neutropenic precautions  Hx of lung CA; on chemo     - onco onboard  Chronic hypoxic respiratory failure     - on 2L [place] at home, continue  A fib     - metoprolol     - hold her coumadin until plts 50k or grtr  HLD     - simavastatin  Constipation     - lactulose  Hypokalemia Hypomagnesemia     - replace, follow  Generalized weakness     - PT eval  DVT prophylaxis: SCDs Code Status: FULL Family Communication: Spoke with dtr, [name], by phone.    Status is: Inpatient  Remains inpatient appropriate because:Inpatient level of care appropriate due to severity of illness   Dispo: The patient is from: SNF              Anticipated d/c is to: SNF              Anticipated d/c date is: 2 days              Patient currently is not medically stable  to d/c.  Consultants:   Oncology  Procedures:   None  Antimicrobials:  . None   ROS:  Denies CP, dyspnea, N/V . Remainder ROS is negative for all not previously mentioned.  Subjective: "When do you think I will leave?"  Objective: Vitals:   07/24/20 1854 07/24/20 1956 07/25/20 0101 07/25/20 0612  BP: (!) 176/100 137/81 136/67 135/66  Pulse: 97 92 97 (!) 106  Resp: 19 14 16 18   Temp: 98.5 F (36.9 C) 98.8 F (37.1 C) 99.5 F (37.5 C) 98.9 F (37.2 C)  TempSrc: Oral Oral Oral Oral  SpO2: 97% 94% 91% 95%  Weight:      Height:        Intake/Output Summary (Last 24 hours) at 07/25/2020 1449 Last data filed at 07/25/2020 0103 Gross per 24 hour  Intake 751.25 ml  Output 300 ml  Net 451.25 ml   Filed Weights   07/24/20 0215 07/24/20 1243  Weight: 93 kg 94.3 kg    Examination:  General: 79 y.o. female resting in bed in NAD Eyes: PERRL, normal sclera  ENMT: Nares patent w/o discharge, orophaynx clear, dentition normal, ears w/o discharge/lesions/ulcers Neck: Supple, trachea midline Cardiovascular: RRR, +S1, S2, no m/g/r, equal pulses throughout Respiratory: CTABL, no w/r/r, normal WOB GI: BS+, NDNT, no masses noted, no organomegaly noted MSK: No e/c/c Skin: No rashes, bruises, ulcerations noted Neuro: A&O x 3, no focal deficits Psyc: Appropriate interaction and affect, calm/cooperative   Data Reviewed: I have personally reviewed following labs and imaging studies.  CBC: Recent Labs  Lab 07/24/20 0212 07/24/20 2244 07/25/20 0124 07/25/20 0721  WBC 1.2* 0.9*  --   --   NEUTROABS 0.0* 0.0*  --   --   HGB 6.6* 8.4*  8.5* 8.3* 8.5*  HCT 20.3* 25.2*  25.4* 24.8* 26.1*  MCV 100.5* 92.7  --   --   PLT 17* 18*  --   --    Basic Metabolic Panel: Recent Labs  Lab 07/24/20 0212 07/24/20 2244 07/25/20 0124  NA 136 140 135  K 3.1* 2.5* 2.7*  CL 97* 99 99  CO2 27 27 25   GLUCOSE 100* 112* 100*  BUN 12 15 14   CREATININE 0.34* 0.37* 0.34*  CALCIUM 8.0*  7.7* 7.6*  MG 1.2*  --   --    GFR: Estimated Creatinine Clearance: 61.7 mL/min (A) (by C-G formula based on SCr of 0.34 mg/dL (L)). Liver Function Tests: Recent Labs  Lab 07/24/20 0212 07/24/20 2244 07/25/20 0124  AST 16 16 13*  ALT 13 13 13   ALKPHOS 65 68 68  BILITOT 1.4* 1.7* 1.5*  PROT 6.2* 5.8* 5.6*  ALBUMIN 2.9* 2.7* 2.7*   No results for input(s): LIPASE, AMYLASE in the last 168 hours. Recent Labs  Lab 07/24/20 2244  AMMONIA 20   Coagulation Profile: Recent Labs  Lab 07/24/20 2244  INR 1.3*   Cardiac Enzymes: No results for input(s): CKTOTAL, CKMB, CKMBINDEX, TROPONINI in the last 168 hours. BNP (last 3 results) No results for input(s): PROBNP in the last 8760 hours. HbA1C: No results for input(s): HGBA1C in the last 72 hours. CBG: Recent Labs  Lab 07/24/20 1737  GLUCAP 93   Lipid Profile: No results for input(s): CHOL, HDL, LDLCALC, TRIG, CHOLHDL, LDLDIRECT in the last 72 hours. Thyroid Function Tests: No results for input(s): TSH, T4TOTAL, FREET4, T3FREE, THYROIDAB in the last 72 hours. Anemia Panel: No results for input(s): VITAMINB12, FOLATE, FERRITIN, TIBC, IRON, RETICCTPCT in the last 72 hours. Sepsis Labs: No results for input(s): PROCALCITON, LATICACIDVEN in the last 168 hours.  Recent Results (from the past 240 hour(s))  Resp Panel by RT-PCR (Flu A&B, Covid) Nasopharyngeal Swab     Status: None   Collection Time: 07/24/20  5:45 AM   Specimen: Nasopharyngeal Swab; Nasopharyngeal(NP) swabs in vial transport medium  Result Value Ref Range Status   SARS Coronavirus 2 by RT PCR NEGATIVE NEGATIVE Final    Comment: (NOTE) SARS-CoV-2 target nucleic acids are NOT DETECTED.  The SARS-CoV-2 RNA is generally detectable in upper respiratory specimens during the acute phase of infection. The lowest concentration of SARS-CoV-2 viral copies this assay can detect is 138 copies/mL. A negative result does not preclude SARS-Cov-2 infection and should not be  used as the sole basis for treatment or other patient management decisions. A negative result may occur with  improper specimen collection/handling, submission of specimen other than nasopharyngeal swab, presence of viral mutation(s) within the areas targeted by this assay, and inadequate number of viral copies(<138 copies/mL). A negative result must be combined with clinical observations, patient history, and  epidemiological information. The expected result is Negative.  Fact Sheet for Patients:  EntrepreneurPulse.com.au  Fact Sheet for Healthcare Providers:  IncredibleEmployment.be  This test is no t yet approved or cleared by the [place] FDA and  has been authorized for detection and/or diagnosis of SARS-CoV-2 by FDA under an Emergency Use Authorization (EUA). This EUA will remain  in effect (meaning this test can be used) for the duration of the COVID-19 declaration under Section 564(b)(1) of the Act, 21 U.S.C.section 360bbb-3(b)(1), unless the authorization is terminated  or revoked sooner.       Influenza A by PCR NEGATIVE NEGATIVE Final   Influenza B by PCR NEGATIVE NEGATIVE Final    Comment: (NOTE) The Xpert Xpress SARS-CoV-2/FLU/RSV plus assay is intended as an aid in the diagnosis of influenza from Nasopharyngeal swab specimens and should not be used as a sole basis for treatment. Nasal washings and aspirates are unacceptable for Xpert Xpress SARS-CoV-2/FLU/RSV testing.  Fact Sheet for Patients: EntrepreneurPulse.com.au  Fact Sheet for Healthcare Providers: IncredibleEmployment.be  This test is not yet approved or cleared by the [place] FDA and has been authorized for detection and/or diagnosis of SARS-CoV-2 by FDA under an Emergency Use Authorization (EUA). This EUA will remain in effect (meaning this test can be used) for the duration of the COVID-19 declaration under Section  564(b)(1) of the Act, 21 U.S.C. section 360bbb-3(b)(1), unless the authorization is terminated or revoked.  Performed at [facility], [place] [address] [place], [place] [address]       Radiology Studies: DG ABD ACUTE 2+V W 1V CHEST  Result Date: 07/24/2020 CLINICAL DATA:  Abdominal discomfort EXAM: DG ABDOMEN ACUTE WITH 1 VIEW CHEST COMPARISON:  Chest CT 06/19/2020 FINDINGS: Moderate colonic stool mainly seen in the ascending colon and rectum. No rectal impaction. No evidence of small bowel obstruction. No concerning mass effect or gas collection. Cardiomegaly. Aortic atherosclerosis. 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 There is no edema, consolidation, effusion, or pneumothorax. IMPRESSION: No acute finding in the chest or abdomen. Electronically Signed   By: Monte Fantasia M.D.   On: 07/24/2020 07:35     Scheduled Meds: . diclofenac Sodium  4 g Topical QID  . gabapentin  100 mg Oral QHS  . metoprolol succinate  12.5 mg Oral QHS  . mirabegron ER  25 mg Oral Daily  . potassium chloride  40 mEq Oral BID  . senna-docusate  1 tablet Oral QHS  . simvastatin  20 mg Oral QHS  . Tbo-filgastrim (GRANIX) SQ  480 mcg Subcutaneous q1800  . timolol  1 drop Both Eyes QHS  . cyanocobalamin  1,000 mcg Oral Daily   Continuous Infusions:   LOS: 0 days    Time spent: 35 minutes spent in the coordination of care today.    Jonnie Finner, DO [facility]  If 7PM-7AM, please contact night-coverage [email or web address] 07/25/2020, 2:49 PM

## 2020-07-26 DIAGNOSIS — D61818 Other pancytopenia: Secondary | ICD-10-CM | POA: Diagnosis not present

## 2020-07-26 LAB — RENAL FUNCTION PANEL
Albumin: 2.7 g/dL — ABNORMAL LOW (ref 3.5–5.0)
Anion gap: 12 (ref 5–15)
BUN: 11 mg/dL (ref 8–23)
CO2: 21 mmol/L — ABNORMAL LOW (ref 22–32)
Calcium: 8.3 mg/dL — ABNORMAL LOW (ref 8.9–10.3)
Chloride: 105 mmol/L (ref 98–111)
Creatinine, Ser: 0.39 mg/dL — ABNORMAL LOW (ref 0.44–1.00)
GFR, Estimated: >60 mL/min (ref >60)
Glucose, Bld: 87 mg/dL (ref 70–99)
Phosphorus: 2.4 mg/dL — ABNORMAL LOW (ref 2.5–4.6)
Potassium: 3.2 mmol/L — ABNORMAL LOW (ref 3.5–5.1)
Sodium: 138 mmol/L (ref 135–145)

## 2020-07-26 LAB — CBC WITH DIFFERENTIAL/PLATELET
Abs Immature Granulocytes: 0.04 10*3/uL (ref 0.00–0.07)
Basophils Absolute: 0 10*3/uL (ref 0.0–0.1)
Basophils Relative: 1 %
Eosinophils Absolute: 0 10*3/uL (ref 0.0–0.5)
Eosinophils Relative: 0 %
HCT: 22.5 % — ABNORMAL LOW (ref 36.0–46.0)
Hemoglobin: 7.2 g/dL — ABNORMAL LOW (ref 12.0–15.0)
Immature Granulocytes: 2 %
Lymphocytes Relative: 51 %
Lymphs Abs: 1.3 10*3/uL (ref 0.7–4.0)
MCH: 30.8 pg (ref 26.0–34.0)
MCHC: 32 g/dL (ref 30.0–36.0)
MCV: 96.2 fL (ref 80.0–100.0)
Monocytes Absolute: 0.4 10*3/uL (ref 0.1–1.0)
Monocytes Relative: 16 %
Neutro Abs: 0.7 10*3/uL — ABNORMAL LOW (ref 1.7–7.7)
Neutrophils Relative %: 30 %
Platelets: 5 10*3/uL — CL (ref 150–400)
RBC: 2.34 MIL/uL — ABNORMAL LOW (ref 3.87–5.11)
RDW: 17.5 % — ABNORMAL HIGH (ref 11.5–15.5)
WBC: 2.5 10*3/uL — ABNORMAL LOW (ref 4.0–10.5)
nRBC: 0 % (ref 0.0–0.2)

## 2020-07-26 LAB — PREPARE RBC (CROSSMATCH)

## 2020-07-26 LAB — MAGNESIUM: Magnesium: 1.5 mg/dL — ABNORMAL LOW (ref 1.7–2.4)

## 2020-07-26 LAB — PROTIME-INR
INR: 1.2 (ref 0.8–1.2)
Prothrombin Time: 14.7 seconds (ref 11.4–15.2)

## 2020-07-26 LAB — FIBRINOGEN: Fibrinogen: 732 mg/dL — ABNORMAL HIGH (ref 210–475)

## 2020-07-26 LAB — LACTATE DEHYDROGENASE: LDH: 192 U/L (ref 98–192)

## 2020-07-26 MED ORDER — SODIUM CHLORIDE 0.9% IV SOLUTION: Freq: Once | INTRAVENOUS | Status: AC

## 2020-07-26 MED ORDER — MAGNESIUM OXIDE 400 (241.3 MG) MG PO TABS
400.0000 mg | ORAL_TABLET | Freq: Two times a day (BID) | ORAL | Status: DC
  Administered 2020-07-26 – 2020-07-28 (×5): 400 mg via ORAL
  Filled 2020-07-26 (×5): qty 1

## 2020-07-26 NOTE — Progress Notes (Addendum)
PROGRESS NOTE    Leah Velasquez  AST:[id number] DOB: Jan 14, 1941 DOA: 07/24/2020 PCP: [name], MD   Brief Narrative:   Leah Waltz [place] a 79 y.o.femalewith medical history significant oflung cancer. Presenting with abdominal pain. Reports that she's had abdominal pain for the last week. It's RUQ radiating to LUQ. 5 minute episodes. Sharp. Partially relieved with TUMS. She states that it seems to be worsened when she isn't allowed to go to the bathroom to have a BM. She reports no other alleviating or aggravating factors. She reports no other treatments for this problem. She tells me that she had some blood work recently because she is on chemotherapy for lung cancer. She states that the people at [facility] were worried about the results and sent her to the ED.  12/5: Hgb down again today. Will get her another unit of pRBCs. Plts down. Transfuse another unit plts. K+/Mg2+ are still low, but improved. Continue PO replacement at this time. Bld Cx are still NTD. She remains afebrile. Continue current Tx otherwise.   Assessment & Plan: Pancytopenia     - s/p transfusion of 2 units pRBCS, 1 unit plts; receiving granix 480 mcg til ANC is greater than 1000     - onco onboard     - no evidence of bleed, follow     - neutropenic precautions     - 12/5: Hgb down after transfusion; will transfuse another unit.     - plts down to 5 this AM; transfuse another unit     - ANC up to 700     - afebrile ON, Bld Cx pending  Hx of lung CA; on chemo     - onco onboard  Chronic hypoxic respiratory failure     - on 2L [place] at home, continue  A fib     - metoprolol     - hold her coumadin until plts 50k or grtr  HLD     - simavastatin  Constipation     - lactulose     - 12/5: BMs ok per her report  Hypokalemia Hypomagnesemia     - 12/5: continue to replace, follow  Generalized weakness     - PT eval  DVT prophylaxis: SCDs Code Status: FULL Family Communication: Spoke with dtr  by phone   Status is: Inpatient  Remains inpatient appropriate because:Inpatient level of care appropriate due to severity of illness   Dispo: The patient is from: SNF              Anticipated d/c is to: SNF              Anticipated d/c date is: 2 days              Patient currently is not medically stable to d/c.  Consultants:   Oncology  Antimicrobials:  . None   ROS:  Denies CP, N, V, ab pain, dyspnea, palpitations. Remainder ROS is negative for all not previously mentioned.  Subjective: "I really want to get my legs working again."  Objective: Vitals:   07/25/20 0612 07/25/20 1502 07/25/20 2150 07/26/20 0559  BP: 135/66 (!) 151/61 115/71 134/79  Pulse: (!) 106 100 98 81  Resp: 18 (!) 24 16 17   Temp: 98.9 F (37.2 C) 98.9 F (37.2 C) 98.2 F (36.8 C) 98.2 F (36.8 C)  TempSrc: Oral Oral Oral Oral  SpO2: 95% 91% 95% 94%  Weight:      Height:  Intake/Output Summary (Last 24 hours) at 07/26/2020 0912 Last data filed at 07/25/2020 1817 Gross per 24 hour  Intake 489.82 ml  Output --  Net 489.82 ml   Filed Weights   07/24/20 0215 07/24/20 1243  Weight: 93 kg 94.3 kg    Examination:  General: 79 y.o. female resting in bed in NAD Eyes: PERRL, normal sclera ENMT: Nares patent w/o discharge, orophaynx clear, dentition normal, ears w/o discharge/lesions/ulcers Neck: Supple, trachea midline Cardiovascular: RRR, +S1, S2, no m/g/r, equal pulses throughout Respiratory: CTABL, no w/r/r, normal WOB GI: BS+, NDNT, no masses noted, no organomegaly noted MSK: No e/c/c Skin: No rashes, bruises, ulcerations noted Neuro: A&O x 3, no focal deficits Psyc: Appropriate interaction and affect, calm/cooperative   Data Reviewed: I have personally reviewed following labs and imaging studies.  CBC: Recent Labs  Lab 07/24/20 0212 07/24/20 0212 07/24/20 2244 07/25/20 0124 07/25/20 0721 07/25/20 1701 07/26/20 0724  WBC 1.2*  --  0.9*  --   --   --  2.5*  NEUTROABS  0.0*  --  0.0*  --   --   --  PENDING  HGB 6.6*   < > 8.4*  8.5* 8.3* 8.5* 8.7* 7.2*  HCT 20.3*   < > 25.2*  25.4* 24.8* 26.1* 24.9* 22.5*  MCV 100.5*  --  92.7  --   --   --  96.2  PLT 17*  --  18*  --   --   --  5*   < > = values in this interval not displayed.   Basic Metabolic Panel: Recent Labs  Lab 07/24/20 0212 07/24/20 2244 07/25/20 0124 07/26/20 0724  NA 136 140 135 138  K 3.1* 2.5* 2.7* 3.2*  CL 97* 99 99 105  CO2 27 27 25  21*  GLUCOSE 100* 112* 100* 87  BUN 12 15 14 11   CREATININE 0.34* 0.37* 0.34* 0.39*  CALCIUM 8.0* 7.7* 7.6* 8.3*  MG 1.2*  --   --  1.5*  PHOS  --   --   --  2.4*   GFR: Estimated Creatinine Clearance: 61.7 mL/min (A) (by C-G formula based on SCr of 0.39 mg/dL (L)). Liver Function Tests: Recent Labs  Lab 07/24/20 0212 07/24/20 2244 07/25/20 0124 07/26/20 0724  AST 16 16 13*  --   ALT 13 13 13   --   ALKPHOS 65 68 68  --   BILITOT 1.4* 1.7* 1.5*  --   PROT 6.2* 5.8* 5.6*  --   ALBUMIN 2.9* 2.7* 2.7* 2.7*   No results for input(s): LIPASE, AMYLASE in the last 168 hours. Recent Labs  Lab 07/24/20 2244  AMMONIA 20   Coagulation Profile: Recent Labs  Lab 07/24/20 2244  INR 1.3*   Cardiac Enzymes: No results for input(s): CKTOTAL, CKMB, CKMBINDEX, TROPONINI in the last 168 hours. BNP (last 3 results) No results for input(s): PROBNP in the last 8760 hours. HbA1C: No results for input(s): HGBA1C in the last 72 hours. CBG: Recent Labs  Lab 07/24/20 1737  GLUCAP 93   Lipid Profile: No results for input(s): CHOL, HDL, LDLCALC, TRIG, CHOLHDL, LDLDIRECT in the last 72 hours. Thyroid Function Tests: No results for input(s): TSH, T4TOTAL, FREET4, T3FREE, THYROIDAB in the last 72 hours. Anemia Panel: No results for input(s): VITAMINB12, FOLATE, FERRITIN, TIBC, IRON, RETICCTPCT in the last 72 hours. Sepsis Labs: No results for input(s): PROCALCITON, LATICACIDVEN in the last 168 hours.  Recent Results (from the past 240 hour(s))    Resp Panel by  RT-PCR (Flu A&B, Covid) Nasopharyngeal Swab     Status: None   Collection Time: 07/24/20  5:45 AM   Specimen: Nasopharyngeal Swab; Nasopharyngeal(NP) swabs in vial transport medium  Result Value Ref Range Status   SARS Coronavirus 2 by RT PCR NEGATIVE NEGATIVE Final    Comment: (NOTE) SARS-CoV-2 target nucleic acids are NOT DETECTED.  The SARS-CoV-2 RNA is generally detectable in upper respiratory specimens during the acute phase of infection. The lowest concentration of SARS-CoV-2 viral copies this assay can detect is 138 copies/mL. A negative result does not preclude SARS-Cov-2 infection and should not be used as the sole basis for treatment or other patient management decisions. A negative result may occur with  improper specimen collection/handling, submission of specimen other than nasopharyngeal swab, presence of viral mutation(s) within the areas targeted by this assay, and inadequate number of viral copies(<138 copies/mL). A negative result must be combined with clinical observations, patient history, and epidemiological information. The expected result is Negative.  Fact Sheet for Patients:  EntrepreneurPulse.com.au  Fact Sheet for Healthcare Providers:  IncredibleEmployment.be  This test is no t yet approved or cleared by the [place] FDA and  has been authorized for detection and/or diagnosis of SARS-CoV-2 by FDA under an Emergency Use Authorization (EUA). This EUA will remain  in effect (meaning this test can be used) for the duration of the COVID-19 declaration under Section 564(b)(1) of the Act, 21 U.S.C.section 360bbb-3(b)(1), unless the authorization is terminated  or revoked sooner.       Influenza A by PCR NEGATIVE NEGATIVE Final   Influenza B by PCR NEGATIVE NEGATIVE Final    Comment: (NOTE) The Xpert Xpress SARS-CoV-2/FLU/RSV plus assay is intended as an aid in the diagnosis of influenza from  Nasopharyngeal swab specimens and should not be used as a sole basis for treatment. Nasal washings and aspirates are unacceptable for Xpert Xpress SARS-CoV-2/FLU/RSV testing.  Fact Sheet for Patients: EntrepreneurPulse.com.au  Fact Sheet for Healthcare Providers: IncredibleEmployment.be  This test is not yet approved or cleared by the [place] FDA and has been authorized for detection and/or diagnosis of SARS-CoV-2 by FDA under an Emergency Use Authorization (EUA). This EUA will remain in effect (meaning this test can be used) for the duration of the COVID-19 declaration under Section 564(b)(1) of the Act, 21 U.S.C. section 360bbb-3(b)(1), unless the authorization is terminated or revoked.  Performed at [facility], [place] [address], [place], [place] [address]   Culture, blood (routine x 2)     Status: None (Preliminary result)   Collection Time: 07/24/20 10:44 PM   Specimen: BLOOD  Result Value Ref Range Status   Specimen Description   Final    BLOOD RIGHT WRIST Performed at [place] [address] [place], [place] [address]    Special Requests   Final    BOTTLES DRAWN AEROBIC ONLY Blood Culture adequate volume Performed at [place] [address], [place], [place] [address]    Culture   Final    NO GROWTH 1 DAY Performed at [facility], [place] [address], [place], [place] [address]    Report Status PENDING  Incomplete  Culture, blood (routine x 2)     Status: None (Preliminary result)   Collection Time: 07/24/20 10:44 PM   Specimen: BLOOD  Result Value Ref Range Status   Specimen Description   Final    BLOOD BLOOD RIGHT HAND Performed at [facility]  [address] [place], [place] [address]    Special Requests   Final    BOTTLES DRAWN AEROBIC ONLY Blood Culture adequate volume Performed at [place]  [address], [place], [place] [address]    Culture   Final    NO GROWTH 1 DAY Performed at [facility], [place] [address], [place], [place] [address]    Report Status PENDING  Incomplete      Radiology Studies: No results found.   Scheduled Meds: . sodium chloride   Intravenous Once  . diclofenac Sodium  4 g Topical QID  . gabapentin  100 mg Oral QHS  . magnesium oxide  400 mg Oral BID  . metoprolol succinate  12.5 mg Oral QHS  . mirabegron ER  25 mg Oral Daily  . potassium chloride  40 mEq Oral BID  . senna-docusate  1 tablet Oral QHS  . simvastatin  20 mg Oral QHS  . Tbo-filgastrim (GRANIX) SQ  480 mcg Subcutaneous q1800  . timolol  1 drop Both Eyes QHS  . cyanocobalamin  1,000 mcg Oral Daily   Continuous Infusions:   LOS: 1 day    Time spent: 30 minutes spent in the coordination of care today.   Jonnie Finner, DO [facility]  If 7PM-7AM, please contact night-coverage [email or web address] 07/26/2020, 9:12 AM

## 2020-07-26 NOTE — Progress Notes (Signed)
CRITICAL VALUE ALERT  Critical Value:  Platelets 5  Date & Time Notied:  07/26/2020 0820  Provider Notified: Marylyn Ishihara, tyrone  Orders Received/Actions taken: pending orders/actions

## 2020-07-27 ENCOUNTER — Inpatient Hospital Stay: Payer: Medicare HMO | Attending: Internal Medicine

## 2020-07-27 DIAGNOSIS — R5381 Other malaise: Secondary | ICD-10-CM

## 2020-07-27 DIAGNOSIS — Z87891 Personal history of nicotine dependence: Secondary | ICD-10-CM | POA: Insufficient documentation

## 2020-07-27 DIAGNOSIS — L8990 Pressure ulcer of unspecified site, unspecified stage: Secondary | ICD-10-CM | POA: Insufficient documentation

## 2020-07-27 DIAGNOSIS — R5383 Other fatigue: Secondary | ICD-10-CM | POA: Insufficient documentation

## 2020-07-27 DIAGNOSIS — R531 Weakness: Secondary | ICD-10-CM | POA: Insufficient documentation

## 2020-07-27 DIAGNOSIS — D61818 Other pancytopenia: Secondary | ICD-10-CM | POA: Diagnosis not present

## 2020-07-27 DIAGNOSIS — C3411 Malignant neoplasm of upper lobe, right bronchus or lung: Secondary | ICD-10-CM | POA: Insufficient documentation

## 2020-07-27 DIAGNOSIS — R0609 Other forms of dyspnea: Secondary | ICD-10-CM | POA: Insufficient documentation

## 2020-07-27 DIAGNOSIS — E876 Hypokalemia: Secondary | ICD-10-CM | POA: Insufficient documentation

## 2020-07-27 DIAGNOSIS — K5900 Constipation, unspecified: Secondary | ICD-10-CM

## 2020-07-27 LAB — BPAM RBC
Blood Product Expiration Date: 202112112359
Blood Product Expiration Date: 202112282359
Blood Product Expiration Date: 202112302359
ISSUE DATE / TIME: 202112031048
ISSUE DATE / TIME: 202112031350
ISSUE DATE / TIME: 202112051139
Unit Type and Rh: 6200
Unit Type and Rh: 6200
Unit Type and Rh: 6200

## 2020-07-27 LAB — BPAM PLATELET PHERESIS
Blood Product Expiration Date: 202112052359
ISSUE DATE / TIME: 202112051038
Unit Type and Rh: 6200

## 2020-07-27 LAB — TYPE AND SCREEN
ABO/RH(D): AB POS
Antibody Screen: NEGATIVE
Unit division: 0
Unit division: 0
Unit division: 0

## 2020-07-27 LAB — COMPREHENSIVE METABOLIC PANEL
ALT: 14 U/L (ref 0–44)
AST: 14 U/L — ABNORMAL LOW (ref 15–41)
Albumin: 2.7 g/dL — ABNORMAL LOW (ref 3.5–5.0)
Alkaline Phosphatase: 74 U/L (ref 38–126)
Anion gap: 11 (ref 5–15)
BUN: 11 mg/dL (ref 8–23)
CO2: 23 mmol/L (ref 22–32)
Calcium: 8.5 mg/dL — ABNORMAL LOW (ref 8.9–10.3)
Chloride: 105 mmol/L (ref 98–111)
Creatinine, Ser: 0.4 mg/dL — ABNORMAL LOW (ref 0.44–1.00)
GFR, Estimated: >60 mL/min (ref >60)
Glucose, Bld: 81 mg/dL (ref 70–99)
Potassium: 3.1 mmol/L — ABNORMAL LOW (ref 3.5–5.1)
Sodium: 139 mmol/L (ref 135–145)
Total Bilirubin: 0.9 mg/dL (ref 0.3–1.2)
Total Protein: 5.7 g/dL — ABNORMAL LOW (ref 6.5–8.1)

## 2020-07-27 LAB — CBC WITH DIFFERENTIAL/PLATELET
Abs Immature Granulocytes: 0.14 10*3/uL — ABNORMAL HIGH (ref 0.00–0.07)
Basophils Absolute: 0 10*3/uL (ref 0.0–0.1)
Basophils Relative: 0 %
Eosinophils Absolute: 0 10*3/uL (ref 0.0–0.5)
Eosinophils Relative: 0 %
HCT: 29.5 % — ABNORMAL LOW (ref 36.0–46.0)
Hemoglobin: 9.2 g/dL — ABNORMAL LOW (ref 12.0–15.0)
Immature Granulocytes: 2 %
Lymphocytes Relative: 26 %
Lymphs Abs: 1.5 10*3/uL (ref 0.7–4.0)
MCH: 28.8 pg (ref 26.0–34.0)
MCHC: 31.2 g/dL (ref 30.0–36.0)
MCV: 92.2 fL (ref 80.0–100.0)
Monocytes Absolute: 1.1 10*3/uL — ABNORMAL HIGH (ref 0.1–1.0)
Monocytes Relative: 19 %
Neutro Abs: 3.1 10*3/uL (ref 1.7–7.7)
Neutrophils Relative %: 53 %
Platelets: 46 10*3/uL — ABNORMAL LOW (ref 150–400)
RBC: 3.2 MIL/uL — ABNORMAL LOW (ref 3.87–5.11)
RDW: 20.6 % — ABNORMAL HIGH (ref 11.5–15.5)
WBC: 5.9 10*3/uL (ref 4.0–10.5)
nRBC: 0 % (ref 0.0–0.2)

## 2020-07-27 LAB — PREPARE PLATELET PHERESIS: Unit division: 0

## 2020-07-27 LAB — MAGNESIUM: Magnesium: 1.4 mg/dL — ABNORMAL LOW (ref 1.7–2.4)

## 2020-07-27 MED ORDER — GERHARDT'S BUTT CREAM
TOPICAL_CREAM | Freq: Two times a day (BID) | CUTANEOUS | Status: DC
  Filled 2020-07-27: qty 1

## 2020-07-27 MED ORDER — MAGNESIUM SULFATE 2 GM/50ML IV SOLN
2.0000 g | Freq: Once | INTRAVENOUS | Status: AC
  Administered 2020-07-27: 2 g via INTRAVENOUS
  Filled 2020-07-27: qty 50

## 2020-07-27 NOTE — Assessment & Plan Note (Addendum)
-   s/p transfusion of 2 units pRBCS, 1 unit plts; receiving granix 480 mcg til ANC is greater than 1000 - ANC improved on 12/6 - labs remained stable and improved at d/c. - 12/7: WBC 8.9, Hgb 9.4, PLTC 38

## 2020-07-27 NOTE — Assessment & Plan Note (Signed)
-  Replete and recheck as needed 

## 2020-07-27 NOTE — Progress Notes (Addendum)
MD, please review order for contact precautions.  Protective precautions is usually what is ordered for neutropenic patients. Thanks!Roderick Pee

## 2020-07-27 NOTE — Consult Note (Signed)
[facility] Nurse wound consult note Consultation was completed by review of records, images and assistance from the bedside nurse/clinical staff.    Reason for Consult: 2 open areas inner thighs, reviewed chart POA.  Patient has B/B incontinence. Not able to manage with female external urinary collection due to diarrhea  Wound type: MASD (moisture associated skin damage) Pressure Injury POA: NA Measurement: 3cm x 1.3cm ( see nursing FS) Wound bed: clean and pink (per nursing flowsheets) Drainage (amount, consistency, odor) none Periwound: intact  Dressing procedure/placement/frequency: Continue silicone foam; add Gerhardt's butt cream to affected areas.   Discussed POC with bedside nurse via secure chat.   Re consult if needed, will not follow at this time. Thanks  Bobette Leyh [employer], RN,CWOCN, CNS, [place] [phone])

## 2020-07-27 NOTE — Care Management Important Message (Signed)
Important Message  Patient Details IM Letter given to the Patient. Name: Leah Velasquez MRN: [id number] Date of Birth: [birth year]   Medicare Important Message Given:  Yes     Kerin Salen 07/27/2020, 11:37 AM

## 2020-07-27 NOTE — NC FL2 (Signed)
[place] LEVEL OF CARE SCREENING TOOL     IDENTIFICATION  Patient Name: Leah Velasquez Birthdate: [birth year] Sex: female Admission Date (Current Location): 07/24/2020  [facility] and [place] Number:  Herbalist and Address:  [facility],  [place] [address], [place]      Provider Number: [id number]  Attending Physician Name and Address:  Dwyane Dee, MD  Relative Name and Phone Number:       Current Level of Care: Hospital Recommended Level of Care: [place] Prior Approval Number:    Date Approved/Denied:   PASRR Number:    Discharge Plan: SNF    Current Diagnoses: Patient Active Problem List   Diagnosis Date Noted  . Pressure injury of skin 07/27/2020  . Pancytopenia ([place]) 07/24/2020  . Altered mental status 05/11/2020  . History of memory loss 05/11/2020  . Encounter for antineoplastic chemotherapy 05/04/2020  . Goals of care, counseling/discussion 05/04/2020  . Small cell lung cancer, right upper lobe ([place]) 04/24/2020  . Rheumatoid arthritis ([place]) 04/24/2020  . Primary osteoarthritis of both knees 04/24/2020  . Non-traumatic rhabdomyolysis   . Non-small cell carcinoma of right lung, stage 3 ([place]) 02/27/2020  . Paresthesias 02/15/2020  . Frequent falls   . Lung mass 02/14/2020  . Long term (current) use of anticoagulants 06/04/2018  . Persistent atrial fibrillation ([place]) 10/22/2017  . Generalized weakness 10/22/2017  . Essential hypertension 10/22/2017  . Thrombocytopenia ([place]) 10/22/2017  . Hyperlipidemia 10/22/2017    Orientation RESPIRATION BLADDER Height & Weight     Self, Time, Situation, Place  Normal Incontinent Weight: 94.3 kg Height:  5\' 1"  (154.9 cm)  BEHAVIORAL SYMPTOMS/MOOD NEUROLOGICAL BOWEL NUTRITION STATUS      Incontinent Diet (Regular)  AMBULATORY STATUS COMMUNICATION OF NEEDS Skin   Extensive Assist Verbally Other (Comment) (Moisture associated skin damage to right and left  inner thighs)                       Personal Care Assistance Level of Assistance  Bathing, Dressing Bathing Assistance: Maximum assistance   Dressing Assistance: Maximum assistance     Functional Limitations Info  Sight, Hearing, Speech Sight Info: Impaired Hearing Info: Adequate Speech Info: Adequate    SPECIAL CARE FACTORS FREQUENCY  PT (By licensed PT), OT (By licensed OT)     PT Frequency: 5 x weekly OT Frequency: 5 x weekly            Contractures Contractures Info: Not present    Additional Factors Info  Code Status, Allergies Code Status Info: Full Allergies Info: Morphine and Sulfa           Current Medications (07/27/2020):  This is the current hospital active medication list Current Facility-Administered Medications  Medication Dose Route Frequency Provider Last Rate Last Admin  . diclofenac Sodium (VOLTAREN) 1 % topical gel 4 g  4 g Topical QID Kyle, Tyrone A, DO   4 g at 07/27/20 1507  . gabapentin (NEURONTIN) capsule 100 mg  100 mg Oral QHS Kyle, Tyrone A, DO   100 mg at 07/26/20 2235  . Gerhardt's butt cream   Topical BID Dwyane Dee, MD   Given at 07/27/20 1016  . magnesium oxide (MAG-OX) tablet 400 mg  400 mg Oral BID Marylyn Ishihara, Tyrone A, DO   400 mg at 07/27/20 1016  . metoprolol succinate (TOPROL-XL) 24 hr tablet 12.5 mg  12.5 mg Oral QHS Kyle, Tyrone A, DO   12.5  mg at 07/26/20 2236  . mirabegron ER (MYRBETRIQ) tablet 25 mg  25 mg Oral Daily Kyle, Tyrone A, DO   25 mg at 07/27/20 1016  . potassium chloride SA (KLOR-CON) CR tablet 40 mEq  40 mEq Oral BID Marylyn Ishihara, Tyrone A, DO   40 mEq at 07/27/20 1016  . prochlorperazine (COMPAZINE) tablet 10 mg  10 mg Oral Q6H PRN Marylyn Ishihara, Tyrone A, DO      . senna-docusate (Senokot-S) tablet 1 tablet  1 tablet Oral QHS Kyle, Tyrone A, DO   1 tablet at 07/24/20 2251  . simvastatin (ZOCOR) tablet 20 mg  20 mg Oral QHS Kyle, Tyrone A, DO   20 mg at 07/26/20 2238  . timolol (TIMOPTIC) 0.5 % ophthalmic solution 1 drop  1  drop Both Eyes QHS Kyle, Tyrone A, DO   1 drop at 07/26/20 2236  . vitamin B-12 (CYANOCOBALAMIN) tablet 1,000 mcg  1,000 mcg Oral Daily Kyle, Tyrone A, DO   1,000 mcg at 07/27/20 1016     Discharge Medications: Please see discharge summary for a list of discharge medications.  Relevant Imaging Results:  Relevant Lab Results:   Additional Information SSN: [id number]  Azariah Bonura, Marjie Skiff, RN

## 2020-07-27 NOTE — Hospital Course (Addendum)
Leah Velasquez is a 79 y.o. female with medical history significant of lung cancer. Presenting with abdominal pain. Reports that she's had abdominal pain for the last week. It's RUQ radiating to LUQ. 5 minute episodes. Sharp. Partially relieved with TUMS. She states that it seems to be worsened when she isn't allowed to go to the bathroom to have a BM. She reports no other alleviating or aggravating factors. She reports no other treatments for this problem. She was transfused as needed during hospitalization until counts improved.  On day of discharge, counts had improved. Constipation was resolved.  WBC 8.9, Hgb 9.4, PLTC 38. No signs of bleeding.

## 2020-07-27 NOTE — Progress Notes (Signed)
PROGRESS NOTE    Leah Velasquez   IRS:[id number]  DOB: 06/15/1941  DOA: 07/24/2020     2  PCP: [name], MD  CC: abd pain  Hospital Course: Leah Withers [place] a 79 y.o.femalewith medical history significant oflung cancer. Presenting with abdominal pain. Reports that she's had abdominal pain for the last week. It's RUQ radiating to LUQ. 5 minute episodes. Sharp. Partially relieved with TUMS. She states that it seems to be worsened when she isn't allowed to go to the bathroom to have a BM. She reports no other alleviating or aggravating factors. She reports no other treatments for this problem. She was transfused as needed during hospitalization until counts improved.    Interval History:  Feeling well this morning.  Transfused yesterday.  Denies any bleeding, shortness of breath, chest pain, palpitations.  Old records reviewed in assessment of this patient  ROS: Constitutional: negative for chills and fevers, Respiratory: negative for cough, Cardiovascular: negative for chest pain and Gastrointestinal: negative for abdominal pain  Assessment & Plan: Pancytopenia - s/p transfusion of 2 units pRBCS, 1 unit plts; receiving granix 480 mcg til ANC is greater than 1000 - onco onboard - ANC improved on 12/6  Hx of lung CA; on chemo - onco onboard  Chronic hypoxic respiratory failure - on 2L [place] at home, continue  A fib - metoprolol - hold her coumadin until plts 50k or grtr  HLD - simavastatin  Constipation - lactulose     - 12/5: BMs ok per her report  Hypokalemia Hypomagnesemia - Replete and recheck as needed  Generalized weakness - PT eval  Antimicrobials:   DVT prophylaxis: SCD Code Status: Full Family Communication: none present Disposition Plan: Status is: Inpatient  Remains inpatient appropriate because:IV treatments appropriate due to intensity of illness or inability to take PO and Inpatient level of  care appropriate due to severity of illness   Dispo: The patient is from: Home              Anticipated d/c is to: Home              Anticipated d/c date is: 1 day              Patient currently is not medically stable to d/c.  Objective: Blood pressure 140/66, pulse 84, temperature 98.1 F (36.7 C), temperature source Oral, resp. rate 14, height 5\' 1"  (1.549 m), weight 94.3 kg, SpO2 96 %.  Examination: General appearance: alert, cooperative and no distress Head: Normocephalic, without obvious abnormality, atraumatic Eyes: EOMI Lungs: clear to auscultation bilaterally Heart: regular rate and rhythm and S1, S2 normal Abdomen: soft, NT, ND, BS present Extremities: trace LE edema Skin: mobility and turgor normal Neurologic: Grossly normal  Consultants:     Procedures:     Data Reviewed: I have personally reviewed following labs and imaging studies Results for orders placed or performed during the hospital encounter of 07/24/20 (from the past 24 hour(s))  Comprehensive metabolic panel     Status: Abnormal   Collection Time: 07/27/20  8:48 AM  Result Value Ref Range   Sodium 139 135 - 145 mmol/L   Potassium 3.1 (L) 3.5 - 5.1 mmol/L   Chloride 105 98 - 111 mmol/L   CO2 23 22 - 32 mmol/L   Glucose, Bld 81 70 - 99 mg/dL   BUN 11 8 - 23 mg/dL   Creatinine, Ser 0.40 (L) 0.44 - 1.00 mg/dL   Calcium 8.5 (L) 8.9 - 10.3  mg/dL   Total Protein 5.7 (L) 6.5 - 8.1 g/dL   Albumin 2.7 (L) 3.5 - 5.0 g/dL   AST 14 (L) 15 - 41 U/L   ALT 14 0 - 44 U/L   Alkaline Phosphatase 74 38 - 126 U/L   Total Bilirubin 0.9 0.3 - 1.2 mg/dL   GFR, Estimated >60 >60 mL/min   Anion gap 11 5 - 15  CBC with Differential/Platelet     Status: Abnormal   Collection Time: 07/27/20  8:48 AM  Result Value Ref Range   WBC 5.9 4.0 - 10.5 K/uL   RBC 3.20 (L) 3.87 - 5.11 MIL/uL   Hemoglobin 9.2 (L) 12.0 - 15.0 g/dL   HCT 29.5 (L) 36 - 46 %   MCV 92.2 80.0 - 100.0 fL   MCH 28.8 26.0 - 34.0 pg   MCHC 31.2 30.0  - 36.0 g/dL   RDW 20.6 (H) 11.5 - 15.5 %   Platelets 46 (L) 150 - 400 K/uL   nRBC 0.0 0.0 - 0.2 %   Neutrophils Relative % 53 %   Neutro Abs 3.1 1.7 - 7.7 K/uL   Lymphocytes Relative 26 %   Lymphs Abs 1.5 0.7 - 4.0 K/uL   Monocytes Relative 19 %   Monocytes Absolute 1.1 (H) 0.1 - 1.0 K/uL   Eosinophils Relative 0 %   Eosinophils Absolute 0.0 0.0 - 0.5 K/uL   Basophils Relative 0 %   Basophils Absolute 0.0 0.0 - 0.1 K/uL   WBC Morphology DOHLE BODIES    Immature Granulocytes 2 %   Abs Immature Granulocytes 0.14 (H) 0.00 - 0.07 K/uL  Magnesium     Status: Abnormal   Collection Time: 07/27/20  8:48 AM  Result Value Ref Range   Magnesium 1.4 (L) 1.7 - 2.4 mg/dL    Recent Results (from the past 240 hour(s))  Resp Panel by RT-PCR (Flu A&B, Covid) Nasopharyngeal Swab     Status: None   Collection Time: 07/24/20  5:45 AM   Specimen: Nasopharyngeal Swab; Nasopharyngeal(NP) swabs in vial transport medium  Result Value Ref Range Status   SARS Coronavirus 2 by RT PCR NEGATIVE NEGATIVE Final    Comment: (NOTE) SARS-CoV-2 target nucleic acids are NOT DETECTED.  The SARS-CoV-2 RNA is generally detectable in upper respiratory specimens during the acute phase of infection. The lowest concentration of SARS-CoV-2 viral copies this assay can detect is 138 copies/mL. A negative result does not preclude SARS-Cov-2 infection and should not be used as the sole basis for treatment or other patient management decisions. A negative result may occur with  improper specimen collection/handling, submission of specimen other than nasopharyngeal swab, presence of viral mutation(s) within the areas targeted by this assay, and inadequate number of viral copies(<138 copies/mL). A negative result must be combined with clinical observations, patient history, and epidemiological information. The expected result is Negative.  Fact Sheet for Patients:  EntrepreneurPulse.com.au  Fact Sheet for  Healthcare Providers:  IncredibleEmployment.be  This test is no t yet approved or cleared by the [place] FDA and  has been authorized for detection and/or diagnosis of SARS-CoV-2 by FDA under an Emergency Use Authorization (EUA). This EUA will remain  in effect (meaning this test can be used) for the duration of the COVID-19 declaration under Section 564(b)(1) of the Act, 21 U.S.C.section 360bbb-3(b)(1), unless the authorization is terminated  or revoked sooner.       Influenza A by PCR NEGATIVE NEGATIVE Final   Influenza B by PCR  NEGATIVE NEGATIVE Final    Comment: (NOTE) The Xpert Xpress SARS-CoV-2/FLU/RSV plus assay is intended as an aid in the diagnosis of influenza from Nasopharyngeal swab specimens and should not be used as a sole basis for treatment. Nasal washings and aspirates are unacceptable for Xpert Xpress SARS-CoV-2/FLU/RSV testing.  Fact Sheet for Patients: EntrepreneurPulse.com.au  Fact Sheet for Healthcare Providers: IncredibleEmployment.be  This test is not yet approved or cleared by the [place] FDA and has been authorized for detection and/or diagnosis of SARS-CoV-2 by FDA under an Emergency Use Authorization (EUA). This EUA will remain in effect (meaning this test can be used) for the duration of the COVID-19 declaration under Section 564(b)(1) of the Act, 21 U.S.C. section 360bbb-3(b)(1), unless the authorization is terminated or revoked.  Performed at [facility], [place] [address] [place], [place] [address]   Culture, blood (routine x 2)     Status: None (Preliminary result)   Collection Time: 07/24/20 10:44 PM   Specimen: BLOOD  Result Value Ref Range Status   Specimen Description   Final    BLOOD RIGHT WRIST Performed at [facility] [address] [place], [place] [address]    Special Requests   Final    BOTTLES DRAWN AEROBIC ONLY Blood  Culture adequate volume Performed at [facility] [address], [place], [place] [address]    Culture   Final    NO GROWTH 2 DAYS Performed at [facility] [address], [place], [place] [address]    Report Status PENDING  Incomplete  Culture, blood (routine x 2)     Status: None (Preliminary result)   Collection Time: 07/24/20 10:44 PM   Specimen: BLOOD  Result Value Ref Range Status   Specimen Description   Final    BLOOD BLOOD RIGHT HAND Performed at [facility] [address] [place], [place] [address]    Special Requests   Final    BOTTLES DRAWN AEROBIC ONLY Blood Culture adequate volume Performed at [facility] [address], [place], [place] [address]    Culture   Final    NO GROWTH 2 DAYS Performed at [facility] [address] [place], [place] [address]    Report Status PENDING  Incomplete     Radiology Studies: No results found. DG ABD ACUTE 2+V W 1V CHEST  Final Result      Scheduled Meds: . diclofenac Sodium  4 g Topical QID  . gabapentin  100 mg Oral QHS  . Gerhardt's butt cream   Topical BID  . magnesium oxide  400 mg Oral BID  . metoprolol succinate  12.5 mg Oral QHS  . mirabegron ER  25 mg Oral Daily  . potassium chloride  40 mEq Oral BID  . senna-docusate  1 tablet Oral QHS  . simvastatin  20 mg Oral QHS  . timolol  1 drop Both Eyes QHS  . cyanocobalamin  1,000 mcg Oral Daily   PRN Meds: prochlorperazine Continuous Infusions:   LOS: 2 days  Time spent: Greater than 50% of the 35 minute visit was spent in counseling/coordination of care for the patient as laid out in the A&P.   Dwyane Dee, MD [facility] 07/27/2020, 4:53 PM

## 2020-07-27 NOTE — Evaluation (Signed)
Physical Therapy Evaluation Patient Details Name: Leah Velasquez MRN: [id number] DOB: 1941-01-29 Today's Date: 07/27/2020   History of Present Illness  79 yo female admitted with pancytopenia. hx of lung ca, obesity, afib, ra, frequent falls, memory loss  Clinical Impression  On eval, pt required Total Assist +2 for mobility. While attempting to sit pt up at EOB, pt had bowel incontinence. Assisted back to supine for cleanup. Rolled to L and R sides. Pt appears confused. Initially stated she wanted to sit up/move around but declined assistance. Once pt allowed to try to sit up on her own, she then stated "okay, you can help." No family present during session. Recommend return to SNF.      Follow Up Recommendations  (return to SNF)    Equipment Recommendations  None recommended by PT    Recommendations for Other Services       Precautions / Restrictions Precautions Precautions: Fall Precaution Comments: incontinent Restrictions Weight Bearing Restrictions: No      Mobility  Bed Mobility Overal bed mobility: Needs Assistance Bed Mobility: Rolling;Supine to Sit;Sit to Supine Rolling: Mod assist;+2 for physical assistance;+2 for safety/equipment   Supine to sit: +2 for physical assistance;Total assist;+2 for safety/equipment;HOB elevated Sit to supine: Total assist;+2 for physical assistance;+2 for safety/equipment;HOB elevated   General bed mobility comments: Attempted supine to sit-mid transfer pt had bowel incontinent. Assisted pt back to bed for cleanup. Roll to L and R sides. Assist for trunk and bil LEs. Utilized bedpad to aid with scoting, positioning. Cues for safety, technique.    Transfers                 General transfer comment: NT  Ambulation/Gait                Stairs            Wheelchair Mobility    Modified Rankin (Stroke Patients Only)       Balance Overall balance assessment: Needs assistance   Sitting balance-Leahy Scale:  Poor                                       Pertinent Vitals/Pain Pain Assessment: Faces Faces Pain Scale: No hurt    Home Living Family/patient expects to be discharged to:: Skilled nursing facility                      Prior Function Level of Independence: Needs assistance   Gait / Transfers Assistance Needed: unsure of level of functioning at SNF           Hand Dominance        Extremity/Trunk Assessment   Upper Extremity Assessment Upper Extremity Assessment: Generalized weakness    Lower Extremity Assessment Lower Extremity Assessment: Difficult to assess due to impaired cognition (able to PF/DF ankles 2/5.)       Communication   Communication: HOH  Cognition Arousal/Alertness: Awake/alert Behavior During Therapy: WFL for tasks assessed/performed Overall Cognitive Status: No family/caregiver present to determine baseline cognitive functioning Area of Impairment: Problem solving;Safety/judgement                         Safety/Judgement: Decreased awareness of deficits   Problem Solving: Requires tactile cues;Requires verbal cues General Comments: pt appears confused. asking to put on clothes while here in the hospital. unaware of incontinence  General Comments      Exercises     Assessment/Plan    PT Assessment Patient needs continued PT services  PT Problem List Decreased strength;Decreased mobility;Decreased range of motion;Decreased activity tolerance;Decreased balance;Decreased knowledge of use of DME;Decreased cognition;Decreased safety awareness;Obesity       PT Treatment Interventions Therapeutic activities;Gait training;DME instruction;Therapeutic exercise;Patient/family education;Balance training;Functional mobility training    PT Goals (Current goals can be found in the Care Plan section)  Acute Rehab PT Goals Patient Stated Goal: none stated. PT Goal Formulation: Patient unable to participate in  goal setting Time For Goal Achievement: 08/10/20 Potential to Achieve Goals: Fair    Frequency Min 2X/week   Barriers to discharge        Co-evaluation               AM-PAC PT "6 Clicks" Mobility  Outcome Measure Help needed turning from your back to your side while in a flat bed without using bedrails?: Total Help needed moving from lying on your back to sitting on the side of a flat bed without using bedrails?: Total Help needed moving to and from a bed to a chair (including a wheelchair)?: Total Help needed standing up from a chair using your arms (e.g., wheelchair or bedside chair)?: Total Help needed to walk in hospital room?: Total Help needed climbing 3-5 steps with a railing? : Total 6 Click Score: 6    End of Session   Activity Tolerance: Patient tolerated treatment well Patient left: in bed;with call bell/phone within reach;with bed alarm set   PT Visit Diagnosis: Muscle weakness (generalized) (M62.81);History of falling (Z91.81);Repeated falls (R29.6)    Time: 3838-1840 PT Time Calculation (min) (ACUTE ONLY): 33 min   Charges:   PT Evaluation $PT Eval Moderate Complexity: 1 Mod PT Treatments $Therapeutic Activity: 8-22 mins           Doreatha Massed, PT Acute Rehabilitation  Office: [phone] Pager: [phone]

## 2020-07-27 NOTE — Assessment & Plan Note (Signed)
-  Follow-up PT eval

## 2020-07-27 NOTE — Assessment & Plan Note (Signed)
-   metoprolol - hold her coumadin until plts 50k or grtr

## 2020-07-27 NOTE — TOC Initial Note (Signed)
Transition of Care [facility]) - Initial/Assessment Note    Patient Details  Name: Leah Velasquez MRN: [id number] Date of Birth: 06/11/1941  Transition of Care [facility] CM/SW Contact:    Lynnell Catalan, RN Phone Number: 07/27/2020, 3:55 PM  Clinical Narrative:                 Pt from [facility] as a [facility] care pt and will dc back there when medically ready. FL2 started and TOC will continue to follow.  Expected Discharge Plan: Skilled Nursing Facility Barriers to Discharge: Continued Medical Work up  Expected Discharge Plan and Services Expected Discharge Plan: [place]   Discharge Planning Services: CM Consult     Prior Living Arrangements/Services     Patient language and need for interpreter reviewed:: Yes              Criminal Activity/Legal Involvement Pertinent to Current Situation/Hospitalization: No - Comment as needed  Activities of Daily Living Home Assistive Devices/Equipment: Grab bars around toilet, Grab bars in shower, Walker (specify type), Other (Comment) (2 and 4 wheeled walkers, toilet riser) ADL Screening (condition at time of admission) Patient's cognitive ability adequate to safely complete daily activities?: No (patient very lethargic) Is the patient deaf or have difficulty hearing?: Yes (hoh) Does the patient have difficulty seeing, even when wearing glasses/contacts?: No Does the patient have difficulty concentrating, remembering, or making decisions?: No Patient able to express need for assistance with ADLs?: Yes Does the patient have difficulty dressing or bathing?: Yes Independently performs ADLs?: No Communication: Independent Dressing (OT): Dependent Is this a change from baseline?: Change from baseline, expected to last >3 days Grooming: Dependent Is this a change from baseline?: Change from baseline, expected to last >3 days Feeding: Dependent Is this a change from baseline?: Change from baseline, expected to last >3 days  Bathing: Dependent Is this a change from baseline?: Change from baseline, expected to last >3 days Toileting: Dependent Is this a change from baseline?: Change from baseline, expected to last >3days In/Out Bed: Dependent Is this a change from baseline?: Change from baseline, expected to last >3 days Walks in Home: Dependent Is this a change from baseline?: Change from baseline, expected to last >3 days Does the patient have difficulty walking or climbing stairs?: Yes (secondary to weakness) Weakness of Legs: Both Weakness of Arms/Hands: Both  Permission Sought/Granted                  Emotional Assessment              Admission diagnosis:  Abdominal discomfort [R10.9] Pancytopenia ([place]) [J24.268] Patient Active Problem List   Diagnosis Date Noted  . Pressure injury of skin 07/27/2020  . Pancytopenia ([place]) 07/24/2020  . Altered mental status 05/11/2020  . History of memory loss 05/11/2020  . Encounter for antineoplastic chemotherapy 05/04/2020  . Goals of care, counseling/discussion 05/04/2020  . Small cell lung cancer, right upper lobe ([place]) 04/24/2020  . Rheumatoid arthritis ([place]) 04/24/2020  . Primary osteoarthritis of both knees 04/24/2020  . Non-traumatic rhabdomyolysis   . Non-small cell carcinoma of right lung, stage 3 ([place]) 02/27/2020  . Paresthesias 02/15/2020  . Frequent falls   . Lung mass 02/14/2020  . Long term (current) use of anticoagulants 06/04/2018  . Persistent atrial fibrillation ([place]) 10/22/2017  . Generalized weakness 10/22/2017  . Essential hypertension 10/22/2017  . Thrombocytopenia ([place]) 10/22/2017  . Hyperlipidemia 10/22/2017   PCP:  [name], MD Pharmacy:  [id number] [address], [name] [address] Phone: [phone] Fax: [phone]     Social Determinants of Health (SDOH) Interventions    Readmission Risk Interventions Readmission Risk Prevention Plan  07/27/2020  Transportation Screening Complete  Medication Review Press photographer) Complete  PCP or Specialist appointment within 3-5 days of discharge Complete  HRI or Home Care Consult Complete  SW Recovery Care/Counseling Consult Complete  Palliative Care Screening Not [place] Complete  Some recent data might be hidden

## 2020-07-28 DIAGNOSIS — D61818 Other pancytopenia: Secondary | ICD-10-CM | POA: Diagnosis not present

## 2020-07-28 DIAGNOSIS — K5900 Constipation, unspecified: Secondary | ICD-10-CM

## 2020-07-28 LAB — CBC WITH DIFFERENTIAL/PLATELET
Abs Immature Granulocytes: 0.27 10*3/uL — ABNORMAL HIGH (ref 0.00–0.07)
Basophils Absolute: 0 10*3/uL (ref 0.0–0.1)
Basophils Relative: 0 %
Eosinophils Absolute: 0 10*3/uL (ref 0.0–0.5)
Eosinophils Relative: 0 %
HCT: 30.6 % — ABNORMAL LOW (ref 36.0–46.0)
Hemoglobin: 9.4 g/dL — ABNORMAL LOW (ref 12.0–15.0)
Immature Granulocytes: 3 %
Lymphocytes Relative: 18 %
Lymphs Abs: 1.6 10*3/uL (ref 0.7–4.0)
MCH: 28.4 pg (ref 26.0–34.0)
MCHC: 30.7 g/dL (ref 30.0–36.0)
MCV: 92.4 fL (ref 80.0–100.0)
Monocytes Absolute: 1.7 10*3/uL — ABNORMAL HIGH (ref 0.1–1.0)
Monocytes Relative: 19 %
Neutro Abs: 5.3 10*3/uL (ref 1.7–7.7)
Neutrophils Relative %: 60 %
Platelets: 38 10*3/uL — ABNORMAL LOW (ref 150–400)
RBC: 3.31 MIL/uL — ABNORMAL LOW (ref 3.87–5.11)
RDW: 20.2 % — ABNORMAL HIGH (ref 11.5–15.5)
WBC: 8.9 10*3/uL (ref 4.0–10.5)
nRBC: 0 % (ref 0.0–0.2)

## 2020-07-28 LAB — BASIC METABOLIC PANEL
Anion gap: 11 (ref 5–15)
BUN: 10 mg/dL (ref 8–23)
CO2: 24 mmol/L (ref 22–32)
Calcium: 8.8 mg/dL — ABNORMAL LOW (ref 8.9–10.3)
Chloride: 105 mmol/L (ref 98–111)
Creatinine, Ser: 0.42 mg/dL — ABNORMAL LOW (ref 0.44–1.00)
GFR, Estimated: >60 mL/min (ref >60)
Glucose, Bld: 79 mg/dL (ref 70–99)
Potassium: 3.1 mmol/L — ABNORMAL LOW (ref 3.5–5.1)
Sodium: 140 mmol/L (ref 135–145)

## 2020-07-28 LAB — SARS CORONAVIRUS 2 BY RT PCR (HOSPITAL ORDER, PERFORMED IN ~~LOC~~ HOSPITAL LAB): SARS Coronavirus 2: NEGATIVE

## 2020-07-28 LAB — MAGNESIUM: Magnesium: 1.6 mg/dL — ABNORMAL LOW (ref 1.7–2.4)

## 2020-07-28 MED ORDER — INFLUENZA VAC A&B SA ADJ QUAD 0.5 ML IM PRSY
0.5000 mL | PREFILLED_SYRINGE | Freq: Once | INTRAMUSCULAR | Status: AC
  Administered 2020-07-28: 0.5 mL via INTRAMUSCULAR
  Filled 2020-07-28: qty 0.5

## 2020-07-28 MED ORDER — INFLUENZA VAC A&B SA ADJ QUAD 0.5 ML IM PRSY: 0.5000 mL | PREFILLED_SYRINGE | INTRAMUSCULAR | Status: DC

## 2020-07-28 MED ORDER — MAGNESIUM SULFATE 2 GM/50ML IV SOLN
2.0000 g | Freq: Once | INTRAVENOUS | Status: AC
  Administered 2020-07-28: 2 g via INTRAVENOUS
  Filled 2020-07-28: qty 50

## 2020-07-28 NOTE — Assessment & Plan Note (Signed)
resolved 

## 2020-07-28 NOTE — Discharge Summary (Signed)
Physician Discharge Summary   Leah Velasquez MWU:[id number] DOB: 1941-02-26 DOA: 07/24/2020  PCP: [name], MD  Admit date: 07/24/2020 Discharge date: 07/28/2020  Admitted From: SNF Disposition:  SNF Discharging physician: Dwyane Dee, MD  Recommendations for Outpatient Follow-up:  1. Continue routine lab checks as previously scheduled.  Patient discharged to SNF in Discharge Condition: stable CODE STATUS: FULL Diet recommendation:  Diet Orders (From admission, onward)    Start     Ordered   07/28/20 0000  Diet general        07/28/20 1123   07/24/20 1243  Diet regular Room service appropriate? Yes; Fluid consistency: Thin  Diet effective now       Question Answer Comment  Room service appropriate? Yes   Fluid consistency: Thin      07/24/20 1242          Hospital Course: Leah Velasquez is a 79 y.o. female with medical history significant of lung cancer. Presenting with abdominal pain. Reports that she's had abdominal pain for the last week. It's RUQ radiating to LUQ. 5 minute episodes. Sharp. Partially relieved with TUMS. She states that it seems to be worsened when she isn't allowed to go to the bathroom to have a BM. She reports no other alleviating or aggravating factors. She reports no other treatments for this problem. She was transfused as needed during hospitalization until counts improved.  On day of discharge, counts had improved. Constipation was resolved.  WBC 8.9, Hgb 9.4, PLTC 38. No signs of bleeding.    * Pancytopenia ([place]) - s/p transfusion of 2 units pRBCS, 1 unit plts; receiving granix 480 mcg til ANC is greater than 1000 - ANC improved on 12/6 - labs remained stable and improved at d/c. - 12/7: WBC 8.9, Hgb 9.4, PLTC 38  Hypomagnesemia -Replete and recheck as needed  Hypokalemia -Replete and recheck as needed  Physical deconditioning -Follow-up PT eval  Persistent atrial fibrillation (HCC) - metoprolol - hold her coumadin until  plts 50k or grtr  Constipation-resolved as of 07/28/2020 - resolved   Principal Diagnosis: Pancytopenia ([place])  Discharge Diagnoses: Active Hospital Problems   Diagnosis Date Noted  . Pancytopenia ([place]) 07/24/2020    Priority: High  . Pressure injury of skin 07/27/2020  . Physical deconditioning 07/27/2020  . Hypokalemia 07/27/2020  . Hypomagnesemia 07/27/2020  . Essential hypertension 10/22/2017  . Hyperlipidemia 10/22/2017  . Persistent atrial fibrillation ([place]) 10/22/2017    Resolved Hospital Problems   Diagnosis Date Noted Date Resolved  . Constipation 07/27/2020 07/28/2020    Discharge Instructions    Diet general   Complete by: As directed    Discharge wound care:   Complete by: As directed    Continue local wound care   Increase activity slowly   Complete by: As directed      Allergies as of 07/28/2020      Reactions   Morphine Anaphylaxis   Morphine And Related Other (See Comments)   "vitals go down", pass out   Sulfa Antibiotics Rash      Medication List    TAKE these medications   acetaminophen 325 MG tablet Commonly known as: TYLENOL Take 650 mg by mouth every 6 (six) hours as needed for mild pain.   bisacodyl 10 MG suppository Commonly known as: DULCOLAX Place 10 mg rectally See admin instructions. If not relieved by MOM give 10mg  Bisacodyl supp. Rectally for x1 dose in 24 hours prn constipation   cyanocobalamin 1000 MCG tablet Take 1  tablet (1,000 mcg total) by mouth daily.   diclofenac Sodium 1 % Gel Commonly known as: Voltaren Apply 4 g topically 4 (four) times daily. To bilateral knees and ankles   gabapentin 100 MG capsule Commonly known as: NEURONTIN Take 100 mg by mouth at bedtime.   levocetirizine 5 MG tablet Commonly known as: XYZAL Take 5 mg by mouth daily.   metoprolol succinate 25 MG 24 hr tablet Commonly known as: TOPROL-XL Take 12.5 mg by mouth at bedtime.   Myrbetriq 25 MG Tb24 tablet Generic drug: mirabegron ER Take  25 mg by mouth daily.   NON FORMULARY Initiate 4oz Medpass qd d/t weight loss (prefers butter pecan).   OXYGEN Inhale 2 L into the lungs as needed (to maintain O2 saturation of 90%).   prochlorperazine 10 MG tablet Commonly known as: COMPAZINE Take 10 mg by mouth every 6 (six) hours as needed for nausea or vomiting.   RA SALINE ENEMA RE Place 1 Container rectally See admin instructions. If not relieved by bisacodyl supp. Give disposable saline enema rectally x1 dose 24 hours prn constipation   simvastatin 20 MG tablet Commonly known as: ZOCOR Take 20 mg by mouth at bedtime.   timolol 0.5 % ophthalmic solution Commonly known as: TIMOPTIC Place 1 drop into both eyes at bedtime.   warfarin 2.5 MG tablet Commonly known as: COUMADIN Take as directed. If you are unsure how to take this medication, talk to your nurse or doctor. Original instructions: Take 2.5 mg by mouth daily.            Discharge Care Instructions  (From admission, onward)         Start     Ordered   07/28/20 0000  Discharge wound care:       Comments: Continue local wound care   07/28/20 1123          Allergies  Allergen Reactions  . Morphine Anaphylaxis  . Morphine And Related Other (See Comments)    "vitals go down", pass out  . Sulfa Antibiotics Rash    Consultations: Oncology  Discharge Exam: BP (!) 142/73 (BP Location: Left Arm)   Pulse 87   Temp 98.2 F (36.8 C) (Oral)   Resp 16   Ht 5\' 1"  (1.549 m)   Wt 94.3 kg   SpO2 94%   BMI 39.30 kg/m  General appearance: alert, cooperative and no distress Head: Normocephalic, without obvious abnormality, atraumatic Eyes: EOMI Lungs: clear to auscultation bilaterally Heart: regular rate and rhythm and S1, S2 normal Abdomen: soft, NT, ND, BS present Extremities: trace LE edema Skin: mobility and turgor normal Neurologic: Grossly normal  The results of significant diagnostics from this hospitalization (including imaging,  microbiology, ancillary and laboratory) are listed below for reference.   Microbiology: Recent Results (from the past 240 hour(s))  Resp Panel by RT-PCR (Flu A&B, Covid) Nasopharyngeal Swab     Status: None   Collection Time: 07/24/20  5:45 AM   Specimen: Nasopharyngeal Swab; Nasopharyngeal(NP) swabs in vial transport medium  Result Value Ref Range Status   SARS Coronavirus 2 by RT PCR NEGATIVE NEGATIVE Final    Comment: (NOTE) SARS-CoV-2 target nucleic acids are NOT DETECTED.  The SARS-CoV-2 RNA is generally detectable in upper respiratory specimens during the acute phase of infection. The lowest concentration of SARS-CoV-2 viral copies this assay can detect is 138 copies/mL. A negative result does not preclude SARS-Cov-2 infection and should not be used as the sole basis for treatment or other  patient management decisions. A negative result may occur with  improper specimen collection/handling, submission of specimen other than nasopharyngeal swab, presence of viral mutation(s) within the areas targeted by this assay, and inadequate number of viral copies(<138 copies/mL). A negative result must be combined with clinical observations, patient history, and epidemiological information. The expected result is Negative.  Fact Sheet for Patients:  EntrepreneurPulse.com.au  Fact Sheet for Healthcare Providers:  IncredibleEmployment.be  This test is no t yet approved or cleared by the [place] FDA and  has been authorized for detection and/or diagnosis of SARS-CoV-2 by FDA under an Emergency Use Authorization (EUA). This EUA will remain  in effect (meaning this test can be used) for the duration of the COVID-19 declaration under Section 564(b)(1) of the Act, 21 U.S.C.section 360bbb-3(b)(1), unless the authorization is terminated  or revoked sooner.       Influenza A by PCR NEGATIVE NEGATIVE Final   Influenza B by PCR NEGATIVE NEGATIVE Final     Comment: (NOTE) The Xpert Xpress SARS-CoV-2/FLU/RSV plus assay is intended as an aid in the diagnosis of influenza from Nasopharyngeal swab specimens and should not be used as a sole basis for treatment. Nasal washings and aspirates are unacceptable for Xpert Xpress SARS-CoV-2/FLU/RSV testing.  Fact Sheet for Patients: EntrepreneurPulse.com.au  Fact Sheet for Healthcare Providers: IncredibleEmployment.be  This test is not yet approved or cleared by the [place] FDA and has been authorized for detection and/or diagnosis of SARS-CoV-2 by FDA under an Emergency Use Authorization (EUA). This EUA will remain in effect (meaning this test can be used) for the duration of the COVID-19 declaration under Section 564(b)(1) of the Act, 21 U.S.C. section 360bbb-3(b)(1), unless the authorization is terminated or revoked.  Performed at [facility], [place] [address] [place], [place] [address]   Culture, blood (routine x 2)     Status: None (Preliminary result)   Collection Time: 07/24/20 10:44 PM   Specimen: BLOOD  Result Value Ref Range Status   Specimen Description   Final    BLOOD RIGHT WRIST Performed at [facility] [address] [place], [place] [address]    Special Requests   Final    BOTTLES DRAWN AEROBIC ONLY Blood Culture adequate volume Performed at [facility] [address], [place], [place] [address]    Culture   Final    NO GROWTH 3 DAYS Performed at [facility], [place] [address] [place], [place] [address]    Report Status PENDING  Incomplete  Culture, blood (routine x 2)     Status: None (Preliminary result)   Collection Time: 07/24/20 10:44 PM   Specimen: BLOOD  Result Value Ref Range Status   Specimen Description   Final    BLOOD BLOOD RIGHT HAND Performed at [facility] [address] [place], [place] [address]    Special Requests    Final    BOTTLES DRAWN AEROBIC ONLY Blood Culture adequate volume Performed at [facility] [address] [place], [place] [address]    Culture   Final    NO GROWTH 3 DAYS Performed at [facility], [place] [address] [place],  [address]    Report Status PENDING  Incomplete     Labs: BNP (last 3 results) Recent Labs    02/15/20 0123 04/16/20 0715 04/22/20 1850  BNP 90.6 178.5* 389.3*   Basic Metabolic Panel: Recent Labs  Lab 07/24/20 0212 07/24/20 0212 07/24/20 2244  07/25/20 0124 07/26/20 0724 07/27/20 0848 07/28/20 0653  NA 136   < > 140 135 138 139 140  K 3.1*   < > 2.5* 2.7* 3.2* 3.1* 3.1*  CL 97*   < > 99 99 105 105 105  CO2 27   < > 27 25 21* 23 24  GLUCOSE 100*   < > 112* 100* 87 81 79  BUN 12   < > 15 14 11 11 10   CREATININE 0.34*   < > 0.37* 0.34* 0.39* 0.40* 0.42*  CALCIUM 8.0*   < > 7.7* 7.6* 8.3* 8.5* 8.8*  MG 1.2*  --   --   --  1.5* 1.4* 1.6*  PHOS  --   --   --   --  2.4*  --   --    < > = values in this interval not displayed.   Liver Function Tests: Recent Labs  Lab 07/24/20 0212 07/24/20 2244 07/25/20 0124 07/26/20 0724 07/27/20 0848  AST 16 16 13*  --  14*  ALT 13 13 13   --  14  ALKPHOS 65 68 68  --  74  BILITOT 1.4* 1.7* 1.5*  --  0.9  PROT 6.2* 5.8* 5.6*  --  5.7*  ALBUMIN 2.9* 2.7* 2.7* 2.7* 2.7*   No results for input(s): LIPASE, AMYLASE in the last 168 hours. Recent Labs  Lab 07/24/20 2244  AMMONIA 20   CBC: Recent Labs  Lab 07/24/20 0212 07/24/20 0212 07/24/20 2244 07/25/20 0124 07/25/20 0721 07/25/20 1701 07/26/20 0724 07/27/20 0848 07/28/20 0653  WBC 1.2*  --  0.9*  --   --   --  2.5* 5.9 8.9  NEUTROABS 0.0*  --  0.0*  --   --   --  0.7* 3.1 5.3  HGB 6.6*   < > 8.4*  8.5*   < > 8.5* 8.7* 7.2* 9.2* 9.4*  HCT 20.3*   < > 25.2*  25.4*   < > 26.1* 24.9* 22.5* 29.5* 30.6*  MCV 100.5*  --  92.7  --   --   --  96.2 92.2 92.4  PLT 17*  --  18*  --   --   --  5* 46* 38*   < > = values  in this interval not displayed.   Cardiac Enzymes: No results for input(s): CKTOTAL, CKMB, CKMBINDEX, TROPONINI in the last 168 hours. BNP: Invalid input(s): POCBNP CBG: Recent Labs  Lab 07/24/20 1737  GLUCAP 93   D-Dimer No results for input(s): DDIMER in the last 72 hours. Hgb A1c No results for input(s): HGBA1C in the last 72 hours. Lipid Profile No results for input(s): CHOL, HDL, LDLCALC, TRIG, CHOLHDL, LDLDIRECT in the last 72 hours. Thyroid function studies No results for input(s): TSH, T4TOTAL, T3FREE, THYROIDAB in the last 72 hours.  Invalid input(s): FREET3 Anemia work up No results for input(s): VITAMINB12, FOLATE, FERRITIN, TIBC, IRON, RETICCTPCT in the last 72 hours. Urinalysis    Component Value Date/Time   COLORURINE YELLOW 05/11/2020 1957   APPEARANCEUR CLOUDY (A) 05/11/2020 1957   LABSPEC 1.010 05/11/2020 1957   PHURINE 7.0 05/11/2020 1957   GLUCOSEU NEGATIVE 05/11/2020 1957   HGBUR MODERATE (A) 05/11/2020 [place] NEGATIVE 05/11/2020 [place] NEGATIVE 05/11/2020 1957   PROTEINUR 30 (A) 05/11/2020 1957   NITRITE NEGATIVE 05/11/2020 1957   LEUKOCYTESUR LARGE (A) 05/11/2020 1957   Sepsis Labs Invalid input(s): PROCALCITONIN,  WBC,  LACTICIDVEN Microbiology Recent Results (from the past  240 hour(s))  Resp Panel by RT-PCR (Flu A&B, Covid) Nasopharyngeal Swab     Status: None   Collection Time: 07/24/20  5:45 AM   Specimen: Nasopharyngeal Swab; Nasopharyngeal(NP) swabs in vial transport medium  Result Value Ref Range Status   SARS Coronavirus 2 by RT PCR NEGATIVE NEGATIVE Final    Comment: (NOTE) SARS-CoV-2 target nucleic acids are NOT DETECTED.  The SARS-CoV-2 RNA is generally detectable in upper respiratory specimens during the acute phase of infection. The lowest concentration of SARS-CoV-2 viral copies this assay can detect is 138 copies/mL. A negative result does not preclude SARS-Cov-2 infection and should not be used as the  sole basis for treatment or other patient management decisions. A negative result may occur with  improper specimen collection/handling, submission of specimen other than nasopharyngeal swab, presence of viral mutation(s) within the areas targeted by this assay, and inadequate number of viral copies(<138 copies/mL). A negative result must be combined with clinical observations, patient history, and epidemiological information. The expected result is Negative.  Fact Sheet for Patients:  EntrepreneurPulse.com.au  Fact Sheet for Healthcare Providers:  IncredibleEmployment.be  This test is no t yet approved or cleared by the [place] FDA and  has been authorized for detection and/or diagnosis of SARS-CoV-2 by FDA under an Emergency Use Authorization (EUA). This EUA will remain  in effect (meaning this test can be used) for the duration of the COVID-19 declaration under Section 564(b)(1) of the Act, 21 U.S.C.section 360bbb-3(b)(1), unless the authorization is terminated  or revoked sooner.       Influenza A by PCR NEGATIVE NEGATIVE Final   Influenza B by PCR NEGATIVE NEGATIVE Final    Comment: (NOTE) The Xpert Xpress SARS-CoV-2/FLU/RSV plus assay is intended as an aid in the diagnosis of influenza from Nasopharyngeal swab specimens and should not be used as a sole basis for treatment. Nasal washings and aspirates are unacceptable for Xpert Xpress SARS-CoV-2/FLU/RSV testing.  Fact Sheet for Patients: EntrepreneurPulse.com.au  Fact Sheet for Healthcare Providers: IncredibleEmployment.be  This test is not yet approved or cleared by the [place] FDA and has been authorized for detection and/or diagnosis of SARS-CoV-2 by FDA under an Emergency Use Authorization (EUA). This EUA will remain in effect (meaning this test can be used) for the duration of the COVID-19 declaration under Section 564(b)(1) of the  Act, 21 U.S.C. section 360bbb-3(b)(1), unless the authorization is terminated or revoked.  Performed at [facility], [place] [address], [place], [place] [address]   Culture, blood (routine x 2)     Status: None (Preliminary result)   Collection Time: 07/24/20 10:44 PM   Specimen: BLOOD  Result Value Ref Range Status   Specimen Description   Final    BLOOD RIGHT WRIST Performed at [facility] [address] [place], [place] [address]    Special Requests   Final    BOTTLES DRAWN AEROBIC ONLY Blood Culture adequate volume Performed at [facility] [address] [place], [place] [address]    Culture   Final    NO GROWTH 3 DAYS Performed at [facility], [place] [address] [place], [place] [address]    Report Status PENDING  Incomplete  Culture, blood (routine x 2)     Status: None (Preliminary result)   Collection Time: 07/24/20 10:44 PM   Specimen: BLOOD  Result Value Ref Range Status   Specimen Description   Final    BLOOD BLOOD RIGHT HAND Performed at  [facility], [place] [address], [place], [place] [address]    Special Requests   Final    BOTTLES DRAWN AEROBIC ONLY Blood Culture adequate volume Performed at [facility] [address] [place], [place] [address]    Culture   Final    NO GROWTH 3 DAYS Performed at [facility], [place] [address], [place],  [address]    Report Status PENDING  Incomplete    Procedures/Studies: DG ABD ACUTE 2+V W 1V CHEST  Result Date: 07/24/2020 CLINICAL DATA:  Abdominal discomfort EXAM: DG ABDOMEN ACUTE WITH 1 VIEW CHEST COMPARISON:  Chest CT 06/19/2020 FINDINGS: Moderate colonic stool mainly seen in the ascending colon and rectum. No rectal impaction. No evidence of small bowel obstruction. No concerning mass effect or gas collection. Cardiomegaly. Aortic atherosclerosis. 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 There is no edema, consolidation, effusion, or pneumothorax. IMPRESSION: No acute finding in the chest or abdomen. Electronically Signed   By: Monte Fantasia M.D.   On: 07/24/2020 07:35     Time coordinating discharge: Over 30 minutes    Dwyane Dee, MD  [facility] 07/28/2020, 11:26 AM

## 2020-07-28 NOTE — TOC Transition Note (Signed)
Transition of Care [facility]) - CM/SW Discharge Note   Patient Details  Name: Leah Velasquez MRN: [id number] Date of Birth: 02/02/1941  Transition [facility] CM/SW Contact:  Lynnell Catalan, RN Phone Number: 07/28/2020, 12:16 PM   Clinical Narrative:     Pt to dc back to [facility] today. PTAR scheduled for 2pm pick up per [facility] request. RN to call report to [phone].  Final next level of care: Skilled Nursing Facility Barriers to Discharge: Continued Medical Work up   Discharge Plan and Services   Discharge Planning Services: CM Consult              Social Determinants of Health (SDOH) Interventions     Readmission Risk Interventions Readmission Risk Prevention Plan 07/27/2020  Transportation Screening Complete  Medication Review [employer]) Complete  PCP or Specialist appointment within 3-5 days of discharge Complete  HRI or Home Care Consult Complete  SW Recovery Care/Counseling Consult Complete  Palliative Care Screening Not [place] Complete  Some recent data might be hidden

## 2020-07-28 NOTE — Discharge Instructions (Signed)
Abdominal Pain, Adult Many things can cause belly (abdominal) pain. Most times, belly pain is not dangerous. Many cases of belly pain can be watched and treated at home. Sometimes, though, belly pain is serious. Your doctor will try to find the cause of your belly pain. Follow these instructions at home:  Medicines  Take over-the-counter and prescription medicines only as told by your doctor.  Do not take medicines that help you poop (laxatives) unless told by your doctor. General instructions  Watch your belly pain for any changes.  Drink enough fluid to keep your pee (urine) pale yellow.  Keep all follow-up visits as told by your doctor. This is important. Contact a doctor if:  Your belly pain changes or gets worse.  You are not hungry, or you lose weight without trying.  You are having trouble pooping (constipated) or have watery poop (diarrhea) for more than 2-3 days.  You have pain when you pee or poop.  Your belly pain wakes you up at night.  Your pain gets worse with meals, after eating, or with certain foods.  You are vomiting and cannot keep anything down.  You have a fever.  You have blood in your pee. Get help right away if:  Your pain does not go away as soon as your doctor says it should.  You cannot stop vomiting.  Your pain is only in areas of your belly, such as the right side or the left lower part of the belly.  You have bloody or black poop, or poop that looks like tar.  You have very bad pain, cramping, or bloating in your belly.  You have signs of not having enough fluid or water in your body (dehydration), such as: ? Dark pee, very little pee, or no pee. ? Cracked lips. ? Dry mouth. ? Sunken eyes. ? Sleepiness. ? Weakness.  You have trouble breathing or chest pain. Summary  Many cases of belly pain can be watched and treated at home.  Watch your belly pain for any changes.  Take over-the-counter and prescription medicines only as  told by your doctor.  Contact a doctor if your belly pain changes or gets worse.  Get help right away if you have very bad pain, cramping, or bloating in your belly. This information is not intended to replace advice given to you by your health care provider. Make sure you discuss any questions you have with your health care provider. Document Revised: 12/17/2018 Document Reviewed: 12/17/2018 Elsevier Patient Education  [place].   Pancytopenia Pancytopenia is a condition in which a person has an abnormally low amount (deficiency) of the following blood cells:  Red blood cells (RBCs). Having too few RBCs is called anemia.  White blood cells (WBCs). Having too few WBCs is called leukopenia.  Cells that help the blood clot (platelets). Having too few platelets is called thrombocytopenia. Cells that become blood cells (stem cells) are made in the soft tissue inside the bones (bone marrow). All blood cells have a limited lifespan. Blood cells are constantly replaced with new blood cells from the bone marrow. Pancytopenia can be caused by any condition or disease that:  Destroys the ability of bone marrow to make blood cells.  Causes bone marrow to make blood cells that cannot survive after they leave the bone marrow. What are the causes? There are many possible causes of this condition. In some cases, the cause is not known. Common causes of the condition include:  A disease that  causes bone marrow to make immature blood cells (megaloblastic anemia).  A blood disorder that makes bone marrow unable to produce enough new RBCs (aplastic anemia or bone marrow failure).  An enlarged spleen (hypersplenism). An enlarged spleen can trap blood cells and destroy them faster than they can be replaced.  Inherited diseases of the blood or bone marrow.  Cancers that affect bone marrow.  Certain medicines, such as: ? Chemotherapy. ? Medicines that reduce the activity of the immune system  (immunosuppressant medicines).  Exposure to radiation.  Severe infections. What increases the risk? You are more likely to develop this condition if:  You are 4?77 years old.  You are female.  You have a family history of a blood or bone marrow disease.  You have certain conditions, such as: ? Alcohol use disorder. ? HIV (human immunodeficiency virus) or AIDS (acquired immunodeficiency syndrome). ? Cancer. ? Conditions in which the body's disease-fighting system attacks normal tissues (autoimmune diseases). What are the signs or symptoms? Symptoms of this condition vary depending on the cause and may include:  Anemia.  Weakness.  Shortness of breath.  Unusual bruising and bleeding.  Frequent infections.  Fatigue.  Fever.  Pale skin.  Bone pain.  Night sweats.  Weight loss.  Headache.  Dizziness.  Feeling unusually cold. How is this diagnosed? This condition may be diagnosed based on:  Your symptoms.  Your medical history.  A physical exam.  Tests. These may include: ? Removal of a sample of bone marrow to be examined under a microscope (biopsy). This is done by inserting a needle into a bone to remove fluid and cells (aspiration). ? A complete blood count (CBC). This is a group of tests that measures characteristics of WBCs, RBCs, and platelets. ? A peripheral blood smear. This test examines your blood under a microscope to provide information about drugs and diseases that affect RBCs, WBCs, and platelets. ? Reticulocyte count. This is a test that measures the amount of new or immature RBCs (reticulocytes) that are made by your bone marrow. ? Imaging studies of your spleen or liver, such as X-rays. ? A test to measure your vitamin B12 level. ? Tests for viruses. How is this treated? Treatment for this condition depends on the cause. Treatment may include:  Immunosuppressant medicines.  Antibiotic medicine.  Vitamin B12. This may be given as a  treatment for megaloblastic anemia.  Medicines that help the bone marrow make blood cells (bone marrow stimulating drugs).  A bone marrow transplant.  Receiving donated blood through an IV (blood transfusion).  A procedure to remove your spleen (splenectomy). This may be done as a treatment for hypersplenism. Follow these instructions at home: Caring for your body      Wash your hands often with soap and water. If soap and water are not available, use hand sanitizer.  Brush your teeth twice a day, and floss at least once a day. It is recommended that you visit the dentist every 6 months.  Stay up to date on your vaccinations, including a yearly (annual) flu shot. Ask your health care provider which vaccines you should get. These may include a pneumonia vaccine. Medicines  Take over-the-counter and prescription medicines only as told by your health care provider.  If you were prescribed an antibiotic medicine, take it as told by your health care provider. Do not stop taking the antibiotic even if you start to feel better. Lifestyle  Do not participate in contact sports or dangerous activities. Ask your  health care provider what activities are safe for you.  During cold and flu season, avoid crowded places and avoid contact with people who are sick. Flu season is typically between the months of October and May. General instructions  Work with your health care provider to manage your condition and educate yourself about your condition.  Follow food safety recommendations as told by your health care provider.  Keep all follow-up visits as told by your health care provider. This is important. Contact a health care provider if you:  Have a fever.  Bruise or bleed easily.  Are dizzy.  Feel unusually weak or tired. Get help right away if you have:  Bleeding that does not stop.  Wheezing or shortness of breath.  Chest pain. These symptoms may represent a serious problem  that is an emergency. Do not wait to see if the symptoms will go away. Get medical help right away. Call your local emergency services (911 in the [place] Do not drive yourself to the hospital. Summary  Pancytopenia is a condition in which a person has an abnormally low amount of red blood cells, white blood cells, and platelets.  There are many possible causes of this condition.  Treatment for this condition depends on the cause.  Stay up to date on your vaccinations.  Do not participate in contact sports or dangerous activities. Ask your health care provider what activities are safe for you. This information is not intended to replace advice given to you by your health care provider. Make sure you discuss any questions you have with your health care provider. Document Revised: 05/14/2018 Document Reviewed: 05/14/2018 Elsevier Patient Education  2020 [employer].

## 2020-07-30 LAB — CULTURE, BLOOD (ROUTINE X 2)
Culture: NO GROWTH
Culture: NO GROWTH
Special Requests: ADEQUATE
Special Requests: ADEQUATE

## 2020-07-31 ENCOUNTER — Encounter: Payer: Self-pay | Admitting: Internal Medicine

## 2020-07-31 ENCOUNTER — Ambulatory Visit (HOSPITAL_COMMUNITY)
Admission: RE | Admit: 2020-07-31 | Discharge: 2020-07-31 | Disposition: A | Discharge status: Home / Self Care | Payer: Medicare HMO | Source: Ambulatory Visit | Attending: Internal Medicine | Admitting: Internal Medicine

## 2020-07-31 ENCOUNTER — Other Ambulatory Visit: Payer: Self-pay

## 2020-07-31 ENCOUNTER — Non-Acute Institutional Stay (SKILLED_NURSING_FACILITY): Payer: Medicare HMO | Admitting: Internal Medicine

## 2020-07-31 DIAGNOSIS — R531 Weakness: Secondary | ICD-10-CM

## 2020-07-31 DIAGNOSIS — C3490 Malignant neoplasm of unspecified part of unspecified bronchus or lung: Secondary | ICD-10-CM | POA: Insufficient documentation

## 2020-07-31 DIAGNOSIS — I4819 Other persistent atrial fibrillation: Secondary | ICD-10-CM | POA: Diagnosis not present

## 2020-07-31 DIAGNOSIS — I10 Essential (primary) hypertension: Secondary | ICD-10-CM

## 2020-07-31 DIAGNOSIS — C3411 Malignant neoplasm of upper lobe, right bronchus or lung: Secondary | ICD-10-CM

## 2020-07-31 DIAGNOSIS — F0390 Unspecified dementia without behavioral disturbance: Secondary | ICD-10-CM

## 2020-07-31 DIAGNOSIS — D61818 Other pancytopenia: Secondary | ICD-10-CM

## 2020-07-31 DIAGNOSIS — R5381 Other malaise: Secondary | ICD-10-CM

## 2020-07-31 IMAGING — CT CT CHEST W/ CM
2 of 5 series · 14 of 36 positions shown, 17 images · IV contrast (OMNIPAQUE)
Comparison: Chest CT [DATE].

CLINICAL DATA: 79-year-old female with history of small cell lung
cancer. Restaging examination.

EXAM:
CT CHEST WITH CONTRAST
TECHNIQUE: Multidetector CT imaging of the chest was performed during
intravenous contrast administration.
CONTRAST:  75mL OMNIPAQUE IOHEXOL 300 MG/ML  SOLN

[Series 2: axial st · axial · 0.75mm/px · z∈[+1196,+1462]mm · 11 of 157 slices shown, 14 images]
[im 12/157  mediastinal]
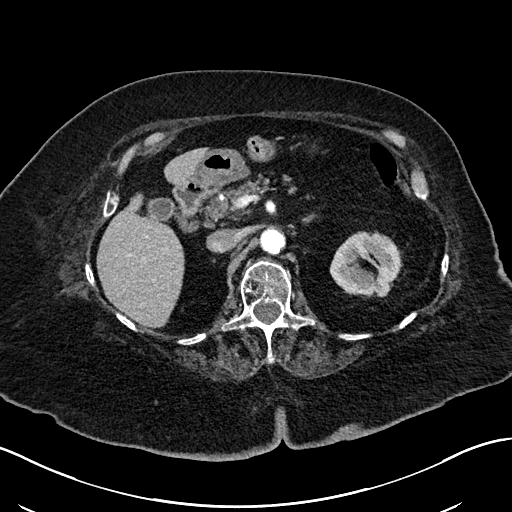
[im 12/157  lung]
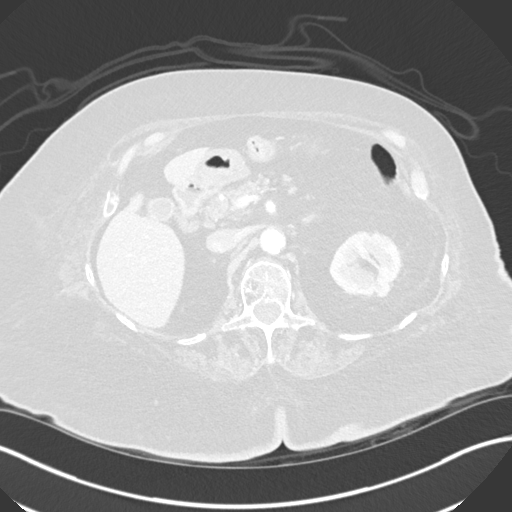
[im 23/157  lung]
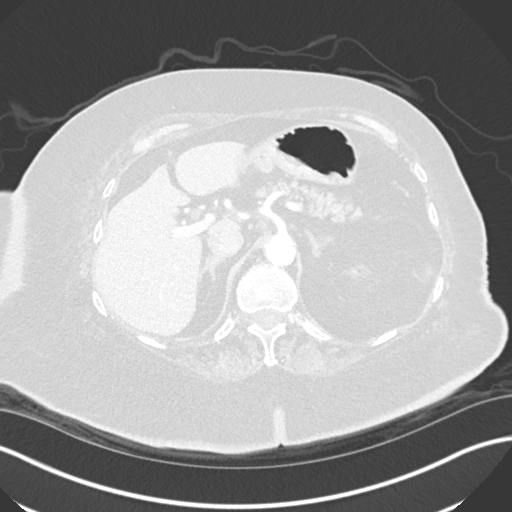
[im 34/157  lung]
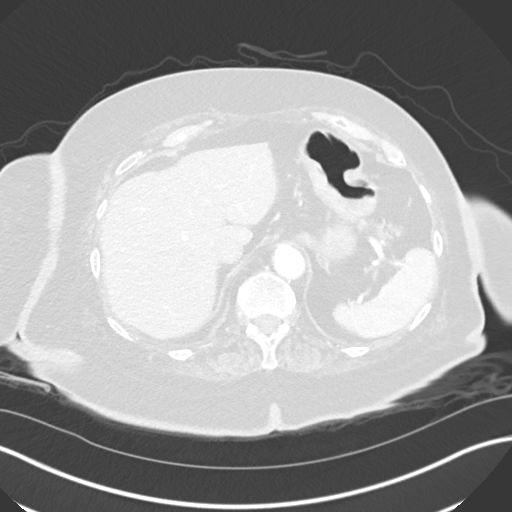
[im 56/157  lung]
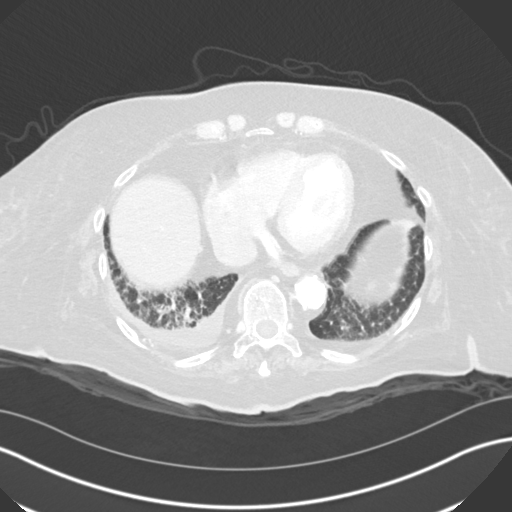
[im 67/157  mediastinal]
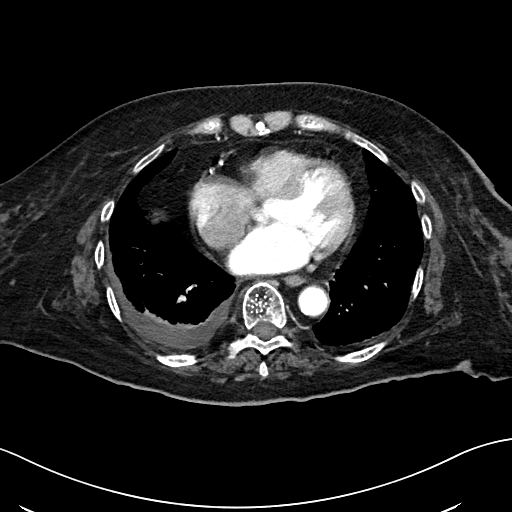
[im 67/157  lung]
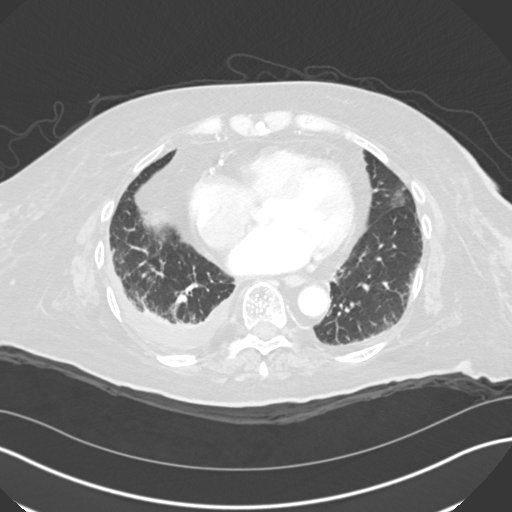
[im 79/157  lung]
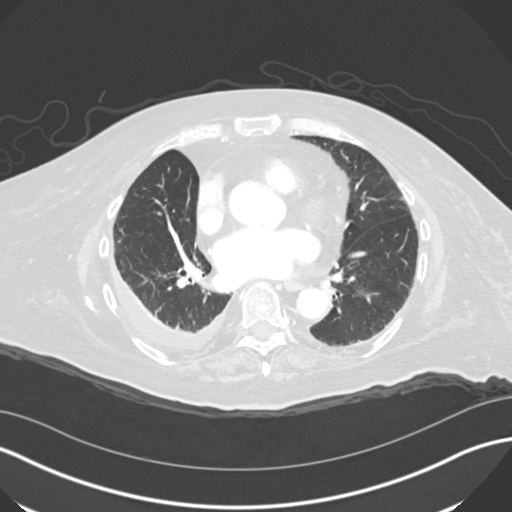
[im 90/157  lung]
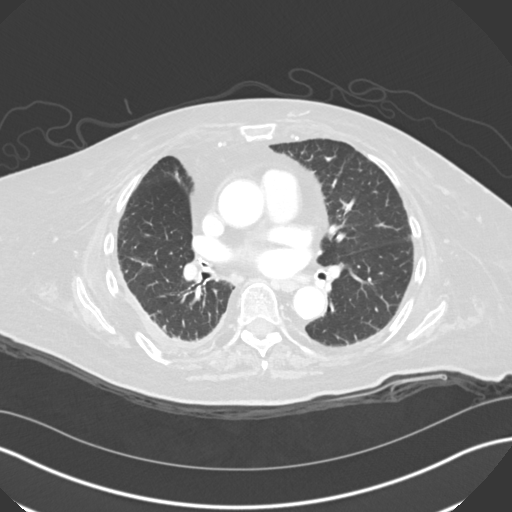
[im 101/157  lung]
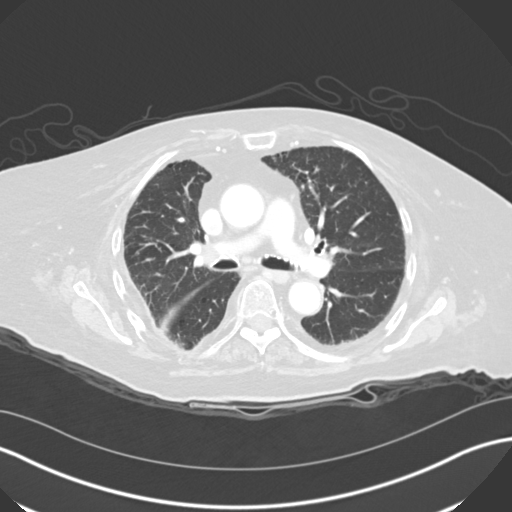
[im 123/157  mediastinal]
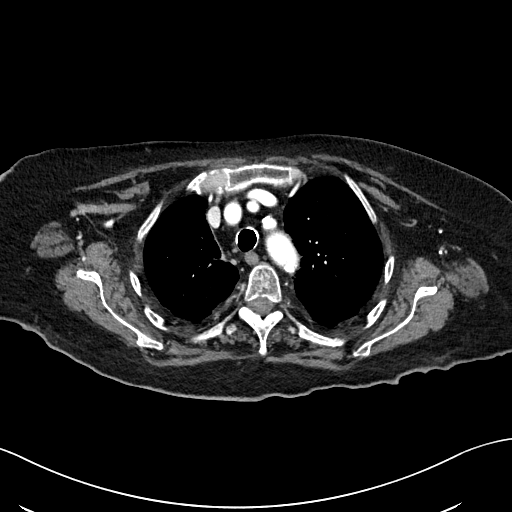
[im 123/157  lung]
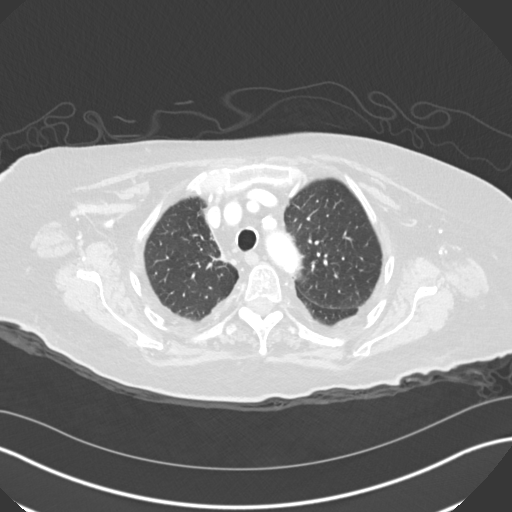
[im 134/157  lung]
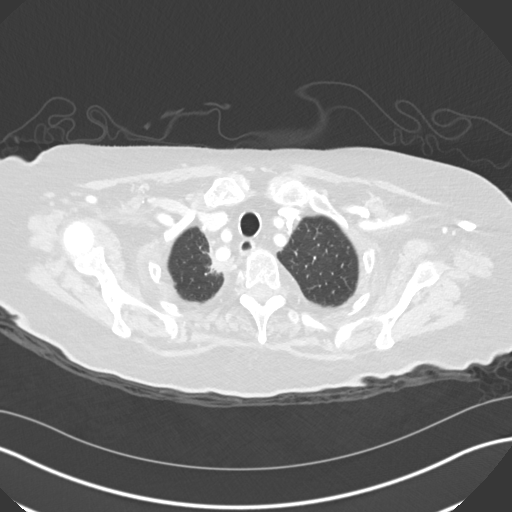
[im 145/157  lung]
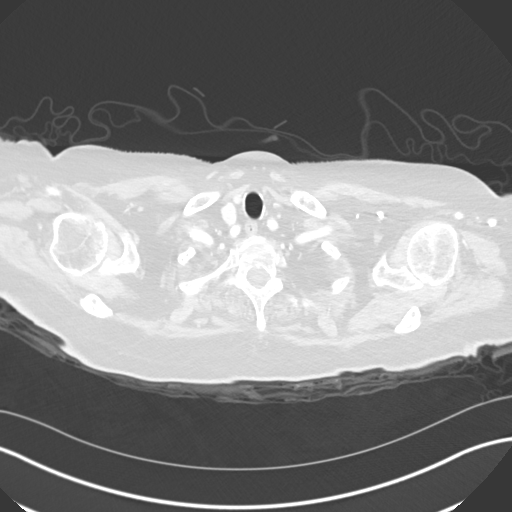

[Series 6: coronal · coronal · 0.73mm/px · 3 of 114 slices shown]
[im 23/114  lung]
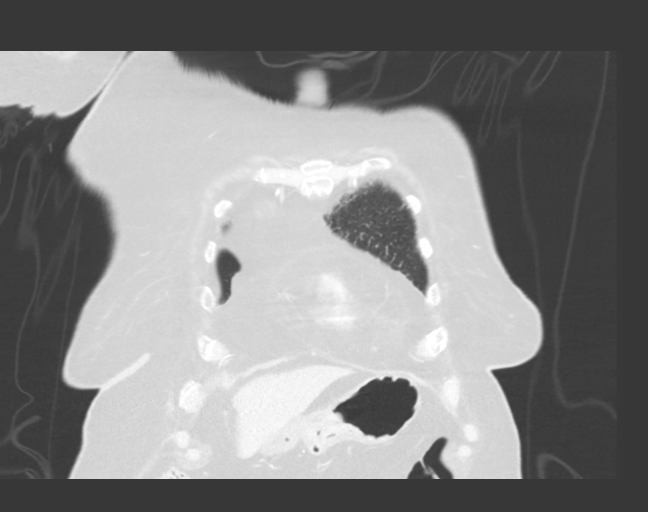
[im 46/114  lung]
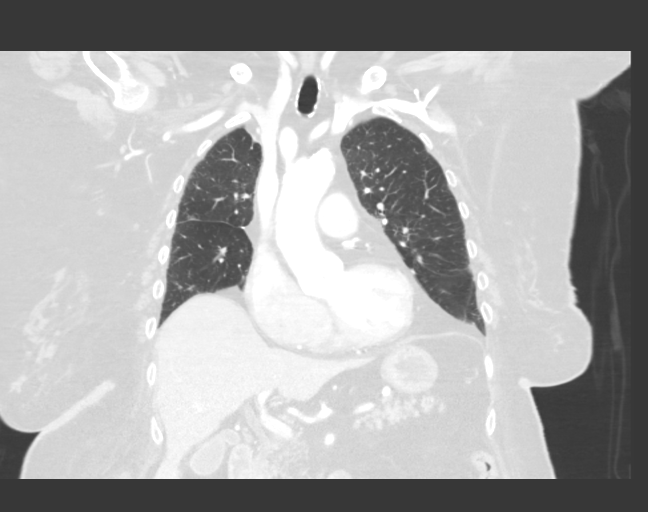
[im 68/114  lung]
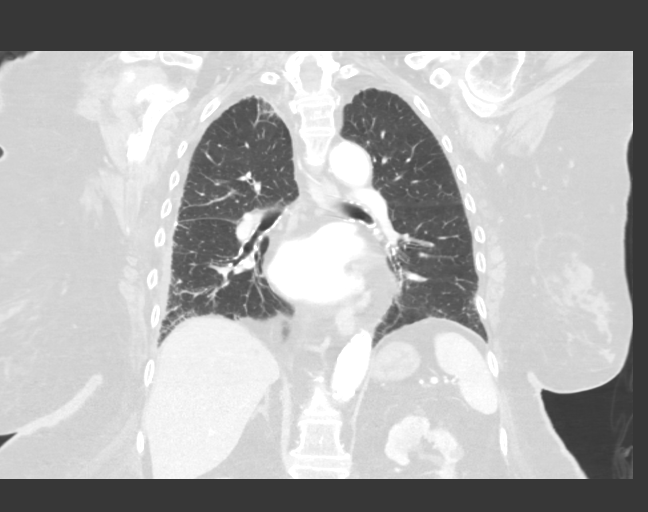

[14 of 36 positions shown; findings below may reference images not displayed]

FINDINGS: Cardiovascular: Heart size is normal. There is no significant
pericardial fluid, thickening or pericardial calcification. There is
aortic atherosclerosis, as well as atherosclerosis of the great
vessels of the mediastinum and the coronary arteries, including
calcified atherosclerotic plaque in the left anterior descending and
right coronary arteries.

Mediastinum/Nodes: No pathologically enlarged mediastinal or hilar
lymph nodes. Esophagus is unremarkable in appearance. No axillary
lymphadenopathy.

Lungs/Pleura: Previously noted suprahilar nodule in the right upper
lobe has decreased in size, currently measuring 9 x 8 mm (axial
image 38 of series 8), previously 1.7 x 1.2 cm on [DATE].
Likewise, nodular pleural base soft tissue in the medial aspect of
the apex of the right hemithorax (axial image 23 of series 2) has
also slightly decreased in size compared to the prior study
measuring 9 x 12 mm on today's examination (previously 1.4 x
cm). No other new suspicious appearing pulmonary nodules or masses
are noted. Small right pleural effusion lying dependently. No left
pleural effusion. Status post right middle lobectomy. Compensatory
hyperexpansion of the right upper and lower lobes. Patchy areas of
ground-glass attenuation and septal thickening are noted in the
lungs bilaterally, most evident in the right lower lobe, compatible
with mild fibrosis. No acute consolidative airspace disease.

Upper Abdomen: Aortic atherosclerosis. Mild diffuse low attenuation
throughout the visualized hepatic parenchyma, indicative of hepatic
steatosis. Subcentimeter low-attenuation lesions in the liver, too
small to characterize, but statistically likely to represent tiny
cysts. Multifocal cortical thinning in the left kidney with multiple
subcentimeter low-attenuation lesions in the left kidney, too small
to characterize, but statistically likely to represent tiny cysts.

Musculoskeletal: There are no aggressive appearing lytic or blastic
lesions noted in the visualized portions of the skeleton.
IMPRESSION: 1. Today's study demonstrates regression of disease both in terms of
the suprahilar nodule in the right upper lobe and pleural based
nodule in the medial aspect of the upper right hemithorax. No new
suspicious findings are otherwise identified.
2. Stable small right pleural effusion.
3. Aortic atherosclerosis, in addition to two vessel coronary artery
disease. Please note that although the presence of coronary artery
calcium documents the presence of coronary artery disease, the
severity of this disease and any potential stenosis cannot be
assessed on this non-gated CT examination. Assessment for potential
risk factor modification, dietary therapy or pharmacologic therapy
may be warranted, if clinically indicated.
4. Hepatic steatosis.
5. Additional incidental findings, as above.

Aortic Atherosclerosis ([E4]-[E4]).

## 2020-07-31 MED ORDER — IOHEXOL 300 MG/ML  SOLN
75.0000 mL | Freq: Once | INTRAMUSCULAR | Status: AC | PRN
  Administered 2020-07-31: 75 mL via INTRAVENOUS

## 2020-07-31 MED ORDER — SODIUM CHLORIDE (PF) 0.9 % IJ SOLN
INTRAMUSCULAR | Status: AC
  Filled 2020-07-31: qty 50

## 2020-07-31 NOTE — Progress Notes (Signed)
Provider:  Rexene Edison. Mariea Clonts, D.O., C.M.D. Location:  [facility] Room Number: [facility]:  SNF (31)  PCP: [name], MD Patient Care Team: [name], MD as PCP - General (Family Medicine) Lorretta Harp, MD as PCP - Cardiology (Cardiology) Gavin Pound, MD as Consulting Physician (Rheumatology) Valrie Hart, RN as Oncology Nurse Navigator  Extended Emergency Contact Information Primary Emergency Contact: [name] Mobile Phone: [phone] Relation: Daughter Secondary Emergency Contact: [name] Mobile Phone: [phone] Relation: Daughter Preferred language: Cleophus Molt Interpreter needed? No  Code Status: FULL CODE Goals of Care: Advanced Directive information Advanced Directives 08/05/2020  Does Patient Have a Medical Advance Directive? No  Type of Advance Directive -  Does patient want to make changes to medical advance directive? No - Patient declined  Copy of [place] in Chart? -  Would patient like information on creating a medical advance directive? -   Chief Complaint  Patient presents with  . Readmit To SNF    Readmit to [facility] SNF    HPI: Patient is a 79 y.o. female with history of lung cancer, persistent atrial fibrillation, rheumatoid arthritis, obesity,, reflux, hypertension and hyperlipidemia seen today for readmission to [facility] status post hospitalization at [facility] from December 3 through December 7 with abnormal labs.  Labs were critical and on-call provider was notified that is why she was sent out to the hospital.  Labs 12 3 indicated hemoglobin of 6.6, BBC 1.2, and platelets of 17.  Absolute neutrophil count was 8.8. at the ER, when she arrived she was complaining of abdominal pain that she had for the past week in the right upper quadrant and radiating to the left upper quadrant and a 5-minute episode.  It was sharp in nature and partially relieved with  a bowel movement.  She was having blood work done at the cancer center and also blood work done at [facility] numerous times.  She has been pancytopenic due to her chemotherapy.  KUB showed moderate stool burden in the emergency department.  Hospitalist admitted the patient.  During her hospitalization she was transfused 2 units of packed red cells as well as platelets and Granix due to her pancytopenia Dr. Julien Nordmann was made aware of her admission and followed along.  Her Coumadin was held during her admission due to her worsened anemia.  Is a full code at the hospital as well.  Discharge labs included a white count of 8.9, hemoglobin 9.4, platelets of 38,000.  [place] had improved on 12/6.  She had no signs of bleeding.  Her Coumadin was remaining on hold until her platelets were greater than 50,000.  Interestingly it was on the med list of the discharge summary despite being on hold without a notation on it.  Magnesium and potassium required repletion.  She was also continued on her 2 L of oxygen via nasal cannula while there.  Constipation was treated with lactulose.  She was noted to be physically deconditioned and evaluated by physical therapy at the hospital.  They noted that she required total assist +2 for mobility.  She had an episode of bowel incontinence while attempting to sit up on the edge of the bed.  She was able to roll to her left and right sides.  She was confused.  She struggled to sit up on her own and required assistance.  Skilled nursing facility was recommended therapy a minimum of 2  times per week.  She comes to [place] now after her hospitalization.  She remains a full code she is on a regular diet with thin liquids.  Has historically struggled with some oropharyngeal dysphagia.  She has cognitive losses and is disoriented to time.  When seen, she was pleasantly confused.  She reports that she can get up and ready on her own to go to her CT appt (has f/u CT chest w/ contrast).  She is  currently unable to move her left leg adequately even to sit up and required repositioning by me to straighten her upper body and her CNA had come and operated her bed controls so she could sit up.  She got irritated when I said she did not have an assistive device nearby to help her get into the bathroom to get herself ready.  The wheelchair she uses was in the hallway pending the staff getting her up and dressed for her appt.  She denied pain. She did have a restless night of sleep.  Says she's not moving her bowels but nursing did not confirm that.  She is breathing well and actually not needing her oxygen at this time.  She still is very weak.  She requests we be vigilant about her labs (she was getting them nearly daily previously for a period of time).  She has cbc, bmp and INR on 12/13; however, these will be done stat today due to her coumadin not being held ongoing upon arrival here b/c it was in the med list and only mentioned to be held in the body of the discharge summary.    After seeing her, I called the [employer] and they feel she has not had clinical improvement with her prior therapy sessions and remains functionally dependent plus her baseline has not changed considerably with her last admission, so they are denying the therapy she's had thus far and recommending application for part B coverage for her for long-term/custodial care with as needed therapy in the future if she is able to get better and her labs stabilize.  Past Medical History:  Diagnosis Date  . A-fib ([place])   . Dysrhythmia   . GERD (gastroesophageal reflux disease)   . Hemorrhoids   . Hyperlipidemia   . Hypertension   . Lung cancer ([place]) 2000   RLL  . Obesity   . Persistent atrial fibrillation ([place])   . Rheumatoid arthritis [facility])    Past Surgical History:  Procedure Laterality Date  . BREAST EXCISIONAL BIOPSY Left    x 2  . BREAST EXCISIONAL BIOPSY Right   . BRONCHIAL BIOPSY  02/18/2020   Procedure:  BRONCHIAL BIOPSIES;  Surgeon: Collene Gobble, MD;  Location: [facility] ENDOSCOPY;  Service: Cardiopulmonary;;  . BRONCHIAL BRUSHINGS  02/18/2020   Procedure: BRONCHIAL BRUSHINGS;  Surgeon: Collene Gobble, MD;  Location: [facility] ENDOSCOPY;  Service: Cardiopulmonary;;  . BRONCHIAL NEEDLE ASPIRATION BIOPSY  02/18/2020   Procedure: BRONCHIAL NEEDLE ASPIRATION BIOPSIES;  Surgeon: Collene Gobble, MD;  Location: [facility];  Service: Cardiopulmonary;;  . BRONCHIAL WASHINGS  02/18/2020   Procedure: BRONCHIAL WASHINGS;  Surgeon: Collene Gobble, MD;  Location: [facility];  Service: Cardiopulmonary;;  . CARDIOVERSION N/A 01/09/2018   Procedure: CARDIOVERSION;  Surgeon: Josue Hector, MD;  Location: [facility] ENDOSCOPY;  Service: Cardiovascular;  Laterality: N/A;  . CARDIOVERSION N/A 03/01/2018   Procedure: CARDIOVERSION;  Surgeon: Sueanne Margarita, MD;  Location: [facility];  Service: Cardiovascular;  Laterality: N/A;  . ELECTROMAGNETIC NAVIGATION  BROCHOSCOPY N/A 02/18/2020   Procedure: ELECTROMAGNETIC NAVIGATION BRONCHOSCOPY;  Surgeon: Collene Gobble, MD;  Location: [facility] ENDOSCOPY;  Service: Cardiopulmonary;  Laterality: N/A;  . KNEE SURGERY    . LUNG LOBECTOMY Right 2000   RLL removed  . VIDEO BRONCHOSCOPY N/A 02/18/2020   Procedure: VIDEO BRONCHOSCOPY WITH FLUORO;  Surgeon: Collene Gobble, MD;  Location: [facility] ENDOSCOPY;  Service: Cardiopulmonary;  Laterality: N/A;  . VIDEO BRONCHOSCOPY WITH ENDOBRONCHIAL ULTRASOUND N/A 04/22/2020   Procedure: VIDEO BRONCHOSCOPY WITH ENDOBRONCHIAL ULTRASOUND;  Surgeon: [name], MD;  Location: [facility] OR;  Service: Thoracic;  Laterality: N/A;    Social History   Socioeconomic History  . Marital status: Married    Spouse name: Not on file  . Number of children: Not on file  . Years of education: Not on file  . Highest education level: Not on file  Occupational History  . Not on file  Tobacco Use  . Smoking status: Former Research scientist (life sciences)  . Smokeless tobacco: Never Used  Vaping  Use  . Vaping Use: Never used  Substance and Sexual Activity  . Alcohol use: No  . Drug use: No  . Sexual activity: Not on file  Other Topics Concern  . Not on file  Social History Narrative   ** Merged History Encounter **       ** Merged History Encounter **       Social Determinants of Health   Financial Resource Strain: Not on file  Food Insecurity: Not on file  Transportation Needs: Not on file  Physical Activity: Not on file  Stress: Not on file  Social Connections: Not on file    reports that she has quit smoking. She has never used smokeless tobacco. She reports that she does not drink alcohol and does not use drugs.  Functional Status Survey:    History reviewed. No pertinent family history.  Health Maintenance  Topic Date Due  . Hepatitis C Screening  Never done  . TETANUS/TDAP  Never done  . COVID-19 Vaccine (3 - Moderna risk 4-dose series) 01/07/2020  . INFLUENZA VACCINE  Completed  . DEXA SCAN  Completed  . PNA vac Low Risk Adult  Completed    Allergies  Allergen Reactions  . Morphine Anaphylaxis  . Morphine And Related Other (See Comments)    "vitals go down", pass out  . Sulfa Antibiotics Rash    Outpatient Encounter Medications as of 07/31/2020  Medication Sig  . acetaminophen (TYLENOL) 325 MG tablet Take 650 mg by mouth every 6 (six) hours as needed for mild pain.  . bisacodyl (DULCOLAX) 10 MG suppository Place 10 mg rectally See admin instructions. If not relieved by MOM give 10mg  Bisacodyl supp. Rectally for x1 dose in 24 hours prn constipation   . diclofenac Sodium (VOLTAREN) 1 % GEL Apply 4 g topically 4 (four) times daily. To bilateral knees and ankles  . gabapentin (NEURONTIN) 100 MG capsule Take 100 mg by mouth at bedtime.  Marland Kitchen levocetirizine (XYZAL) 5 MG tablet Take 5 mg by mouth daily.   . metoprolol succinate (TOPROL-XL) 25 MG 24 hr tablet Take 12.5 mg by mouth at bedtime.   Marland Kitchen MYRBETRIQ 25 MG TB24 tablet Take 25 mg by mouth daily.  .  NON FORMULARY Initiate 4oz Medpass qd d/t weight loss (prefers butter pecan).  . prochlorperazine (COMPAZINE) 10 MG tablet Take 10 mg by mouth every 6 (six) hours as needed for nausea or vomiting.  . simvastatin (ZOCOR) 20 MG tablet Take 20  mg by mouth at bedtime.  . Sodium Phosphates (RA SALINE ENEMA RE) Place 1 Container rectally See admin instructions. If not relieved by bisacodyl supp. Give disposable saline enema rectally x1 dose 24 hours prn constipation   . timolol (TIMOPTIC) 0.5 % ophthalmic solution Place 1 drop into both eyes at bedtime.   . vitamin B-12 1000 MCG tablet Take 1 tablet (1,000 mcg total) by mouth daily.  Marland Kitchen warfarin (COUMADIN) 2.5 MG tablet Take 2.5 mg by mouth daily.  . [DISCONTINUED] OXYGEN Inhale 2 L into the lungs as needed (to maintain O2 saturation of 90%).  . [DISCONTINUED] sodium chloride (PF) 0.9 % injection    No facility-administered encounter medications on file as of 07/31/2020.    Review of Systems  Constitutional: Positive for malaise/fatigue. Negative for chills and fever.  HENT: Negative for congestion and sore throat.   Eyes: Negative for blurred vision.  Respiratory: Negative for cough and shortness of breath.   Cardiovascular: Negative for chest pain, palpitations, orthopnea, leg swelling and PND.  Gastrointestinal: Positive for constipation. Negative for abdominal pain, blood in stool, diarrhea and melena.  Genitourinary: Negative for dysuria.  Musculoskeletal: Positive for falls. Negative for back pain and joint pain.  Skin: Negative for itching and rash.  Neurological: Negative for dizziness and loss of consciousness.  Endo/Heme/Allergies: Does not bruise/bleed easily.       Pancytopenia improved, need updated platelets    Vitals:   07/31/20 1519  BP: 126/71  Pulse: 84  Temp: (!) 97.5 F (36.4 C)  Weight: 208 lb (94.3 kg)  Height: 5\' 1"  (1.549 m)   Body mass index is 39.3 kg/m. Physical Exam Vitals reviewed.  Constitutional:       General: She is not in acute distress.    Appearance: Normal appearance. She is obese. She is not toxic-appearing.  HENT:     Head: Normocephalic and atraumatic.     Right Ear: External ear normal.     Left Ear: External ear normal.     Nose: Nose normal.     Mouth/Throat:     Pharynx: Oropharynx is clear.  Eyes:     Pupils: Pupils are equal, round, and reactive to light.  Cardiovascular:     Rate and Rhythm: Rhythm irregular.  Pulmonary:     Effort: Pulmonary effort is normal.     Breath sounds: Normal breath sounds.  Abdominal:     General: Bowel sounds are normal.     Palpations: Abdomen is soft.     Tenderness: There is no abdominal tenderness.  Musculoskeletal:     Right lower leg: No edema.     Left lower leg: No edema.     Comments: Difficulty moving her legs in the bed on her own and sitting up  Skin:    General: Skin is warm and dry.     Coloration: Skin is pale.  Neurological:     Mental Status: She is alert.     Motor: Weakness present.     Gait: Gait abnormal.     Comments: Only oriented to person, confused (see hpi)  Psychiatric:        Mood and Affect: Mood normal.     Labs reviewed: Basic Metabolic Panel: Recent Labs    02/15/20 0121 02/16/20 0431 07/26/20 0724 07/27/20 0848 07/28/20 0653 08/03/20 1329  NA 138   < > 138 139 140 140  K 3.5   < > 3.2* 3.1* 3.1* 2.8*  CL 102   < >  105 105 105 102  CO2 23   < > 21* 23 24 27   GLUCOSE 99   < > 87 81 79 95  BUN 12   < > 11 11 10  5*  CREATININE 0.82   < > 0.39* 0.40* 0.42* 0.56  CALCIUM 9.0   < > 8.3* 8.5* 8.8* 9.1  MG 1.9   < > 1.5* 1.4* 1.6*  --   PHOS 3.0  --  2.4*  --   --   --    < > = values in this interval not displayed.   Liver Function Tests: Recent Labs    07/25/20 0124 07/26/20 0724 07/27/20 0848 08/03/20 1329  AST 13*  --  14* 19  ALT 13  --  14 19  ALKPHOS 68  --  74 85  BILITOT 1.5*  --  0.9 0.7  PROT 5.6*  --  5.7* 6.3*  ALBUMIN 2.7* 2.7* 2.7* 2.5*   No results for  input(s): LIPASE, AMYLASE in the last 8760 hours. Recent Labs    07/24/20 2244  AMMONIA 20   CBC: Recent Labs    07/27/20 0848 07/28/20 0653 08/03/20 1329  WBC 5.9 8.9 11.2*  NEUTROABS 3.1 5.3 7.2  HGB 9.2* 9.4* 10.4*  HCT 29.5* 30.6* 32.7*  MCV 92.2 92.4 91.6  PLT 46* 38* 175   Cardiac Enzymes: Recent Labs    04/16/20 0715 04/18/20 0239 04/20/20 0807  CKTOTAL 1,732* 1,835* 255*   BNP: Invalid input(s): POCBNP Lab Results  Component Value Date   HGBA1C 5.7 (H) 02/15/2020   Lab Results  Component Value Date   TSH 2.458 02/15/2020   Lab Results  Component Value Date   OVFIEPPI95 188 04/16/2020   No results found for: FOLATE No results found for: IRON, TIBC, FERRITIN  Imaging and Procedures obtained prior to SNF admission: DG ABD ACUTE 2+V W 1V CHEST  Result Date: 07/24/2020 CLINICAL DATA:  Abdominal discomfort EXAM: DG ABDOMEN ACUTE WITH 1 VIEW CHEST COMPARISON:  Chest CT 06/19/2020 FINDINGS: Moderate colonic stool mainly seen in the ascending colon and rectum. No rectal impaction. No evidence of small bowel obstruction. No concerning mass effect or gas collection. Cardiomegaly. Aortic atherosclerosis. 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 There is no edema, consolidation, effusion, or pneumothorax. IMPRESSION: No acute finding in the chest or abdomen. Electronically Signed   By: Monte Fantasia M.D.   On: 07/24/2020 07:35    Assessment/Plan 1. Pancytopenia ([place]) -due to chemo -had improved with blood, platelets, but platelets remained under 50K so coumadin was to be held -fortunately, no signs of bleeding or bruising  2. Small cell lung cancer, right upper lobe ([place]) -receiving chemo -declining functional status and thinks she can do a lot more than she's capable of at this time  3. Persistent atrial fibrillation ([facility]) -cont metoprolol for rate control -coumadin was to be held until platelets over 50K but was not so labs  ordered stat (cbc and INR) early in the am today but did not return before I left--nursing was to call me directly with results for next steps--both nurses told this at the nurses station   4. Essential hypertension -bp satisfactory, no changes, monitor  5. Dementia without behavioral disturbance, unspecified dementia type ([place]) -progressing, now only oriented to person -may have some delirium residual from hospitalization  6. Physical deconditioning -remains, continues chemo and has some underlying cognitive impairment that seems to have progressed since her treatments and pancytopenia  7. Generalized weakness -returns here still weak and minimally mobile  -isnurance no longer covering therapy  Family/ staff Communication: d/w snf nurses and NP  Labs/tests ordered:  Stat cbc, INR to be called to me not on call   Syrenity Klepacki L. Vernis Eid, D.O. [facility] [address], [place] [address] Cell Phone (Mon-Fri 8am-5pm):  [phone] On Call:  [phone] & follow prompts after 5pm & weekends Office Phone:  [phone] Office Fax:  [phone]

## 2020-08-03 ENCOUNTER — Inpatient Hospital Stay (HOSPITAL_BASED_OUTPATIENT_CLINIC_OR_DEPARTMENT_OTHER): Payer: Medicare HMO | Admitting: Internal Medicine

## 2020-08-03 ENCOUNTER — Inpatient Hospital Stay: Payer: Medicare HMO

## 2020-08-03 ENCOUNTER — Encounter: Payer: Self-pay | Admitting: Internal Medicine

## 2020-08-03 ENCOUNTER — Other Ambulatory Visit: Payer: Self-pay

## 2020-08-03 VITALS — BP 128/85 | HR 100 | Temp 97.7°F | Resp 18

## 2020-08-03 DIAGNOSIS — R5383 Other fatigue: Secondary | ICD-10-CM | POA: Diagnosis not present

## 2020-08-03 DIAGNOSIS — R0609 Other forms of dyspnea: Secondary | ICD-10-CM | POA: Diagnosis not present

## 2020-08-03 DIAGNOSIS — I10 Essential (primary) hypertension: Secondary | ICD-10-CM | POA: Diagnosis not present

## 2020-08-03 DIAGNOSIS — E876 Hypokalemia: Secondary | ICD-10-CM | POA: Diagnosis not present

## 2020-08-03 DIAGNOSIS — C3411 Malignant neoplasm of upper lobe, right bronchus or lung: Secondary | ICD-10-CM | POA: Diagnosis not present

## 2020-08-03 DIAGNOSIS — C3490 Malignant neoplasm of unspecified part of unspecified bronchus or lung: Secondary | ICD-10-CM | POA: Diagnosis not present

## 2020-08-03 DIAGNOSIS — R531 Weakness: Secondary | ICD-10-CM | POA: Diagnosis not present

## 2020-08-03 DIAGNOSIS — Z87891 Personal history of nicotine dependence: Secondary | ICD-10-CM | POA: Diagnosis not present

## 2020-08-03 LAB — CBC WITH DIFFERENTIAL (CANCER CENTER ONLY)
Abs Immature Granulocytes: 0.21 10*3/uL — ABNORMAL HIGH (ref 0.00–0.07)
Basophils Absolute: 0.1 10*3/uL (ref 0.0–0.1)
Basophils Relative: 1 %
Eosinophils Absolute: 0 10*3/uL (ref 0.0–0.5)
Eosinophils Relative: 0 %
HCT: 32.7 % — ABNORMAL LOW (ref 36.0–46.0)
Hemoglobin: 10.4 g/dL — ABNORMAL LOW (ref 12.0–15.0)
Immature Granulocytes: 2 %
Lymphocytes Relative: 14 %
Lymphs Abs: 1.5 10*3/uL (ref 0.7–4.0)
MCH: 29.1 pg (ref 26.0–34.0)
MCHC: 31.8 g/dL (ref 30.0–36.0)
MCV: 91.6 fL (ref 80.0–100.0)
Monocytes Absolute: 2.2 10*3/uL — ABNORMAL HIGH (ref 0.1–1.0)
Monocytes Relative: 19 %
Neutro Abs: 7.2 10*3/uL (ref 1.7–7.7)
Neutrophils Relative %: 64 %
Platelet Count: 175 10*3/uL (ref 150–400)
RBC: 3.57 MIL/uL — ABNORMAL LOW (ref 3.87–5.11)
RDW: 20.4 % — ABNORMAL HIGH (ref 11.5–15.5)
WBC Count: 11.2 10*3/uL — ABNORMAL HIGH (ref 4.0–10.5)
nRBC: 0.2 % (ref 0.0–0.2)

## 2020-08-03 LAB — CMP (CANCER CENTER ONLY)
ALT: 19 U/L (ref 0–44)
AST: 19 U/L (ref 15–41)
Albumin: 2.5 g/dL — ABNORMAL LOW (ref 3.5–5.0)
Alkaline Phosphatase: 85 U/L (ref 38–126)
Anion gap: 11 (ref 5–15)
BUN: 5 mg/dL — ABNORMAL LOW (ref 8–23)
CO2: 27 mmol/L (ref 22–32)
Calcium: 9.1 mg/dL (ref 8.9–10.3)
Chloride: 102 mmol/L (ref 98–111)
Creatinine: 0.56 mg/dL (ref 0.44–1.00)
GFR, Estimated: >60 mL/min (ref >60)
Glucose, Bld: 95 mg/dL (ref 70–99)
Potassium: 2.8 mmol/L — ABNORMAL LOW (ref 3.5–5.1)
Sodium: 140 mmol/L (ref 135–145)
Total Bilirubin: 0.7 mg/dL (ref 0.3–1.2)
Total Protein: 6.3 g/dL — ABNORMAL LOW (ref 6.5–8.1)

## 2020-08-03 MED ORDER — POTASSIUM CHLORIDE CRYS ER 20 MEQ PO TBCR: 20.0000 meq | EXTENDED_RELEASE_TABLET | Freq: Two times a day (BID) | ORAL | 0 refills | Status: DC

## 2020-08-03 NOTE — Progress Notes (Signed)
[place] [phone]   [phone]  OFFICE PROGRESS NOTE  [name], MD [address] [place] [address]  DIAGNOSIS: Limited stage, stage IIIb (T2 a, N2, M0) presented with right upper lobe lung mass in addition to mediastinal lymphadenopathy diagnosed in June 2021.  PRIOR THERAPY: Systemic chemotherapy with carboplatin for AUC of 5 from day 1 and etoposide 100 mg/M2 on days 1, 2 and 3 with Neulasta support every 3 weeks.  First dose 05/11/2020.  Status post 4 cycles.    CURRENT THERAPY: Observation.  INTERVAL HISTORY: Leah Velasquez 79 y.o. female returns to the clinic today for follow-up visit accompanied by her [place].  The patient is feeling fine today with no concerning complaints except for the generalized fatigue and weakness.  She was admitted to the hospital recently with pancytopenia as well as right upper quadrant abdominal pain.  She received Granix during her hospitalization and her white blood count improved.  She also received 2 units of PRBCs transfusion during her admission.  The patient is feeling a little bit better today except for the fatigue.  She denied having any current chest pain but has shortness of breath with exertion with no cough or hemoptysis.  She denied having any fever or chills.  She has no nausea, vomiting, diarrhea or constipation.  She has no headache or visual changes.  She had repeat CT scan of the chest performed recently and she is here for evaluation and discussion of her risk her results.  MEDICAL HISTORY: Past Medical History:  Diagnosis Date  . A-fib ([place])   . Dysrhythmia   . GERD (gastroesophageal reflux disease)   . Hemorrhoids   . Hyperlipidemia   . Hypertension   . Lung cancer ([place]) 2000   RLL  . Obesity   . Persistent atrial fibrillation ([place])   . Rheumatoid arthritis ([facility])     ALLERGIES:  is allergic to morphine, morphine and related, and sulfa  antibiotics.  MEDICATIONS:  Current Outpatient Medications  Medication Sig Dispense Refill  . acetaminophen (TYLENOL) 325 MG tablet Take 650 mg by mouth every 6 (six) hours as needed for mild pain.    . bisacodyl (DULCOLAX) 10 MG suppository Place 10 mg rectally See admin instructions. If not relieved by MOM give 10mg  Bisacodyl supp. Rectally for x1 dose in 24 hours prn constipation     . diclofenac Sodium (VOLTAREN) 1 % GEL Apply 4 g topically 4 (four) times daily. To bilateral knees and ankles 350 g 1  . gabapentin (NEURONTIN) 100 MG capsule Take 100 mg by mouth at bedtime.    Marland Kitchen levocetirizine (XYZAL) 5 MG tablet Take 5 mg by mouth daily.     . metoprolol succinate (TOPROL-XL) 25 MG 24 hr tablet Take 12.5 mg by mouth at bedtime.     Marland Kitchen MYRBETRIQ 25 MG TB24 tablet Take 25 mg by mouth daily.    . NON FORMULARY Initiate 4oz Medpass qd d/t weight loss (prefers butter pecan).    . OXYGEN Inhale 2 L into the lungs as needed (to maintain O2 saturation of 90%).    Marland Kitchen prochlorperazine (COMPAZINE) 10 MG tablet Take 10 mg by mouth every 6 (six) hours as needed for nausea or vomiting.    . simvastatin (ZOCOR) 20 MG tablet Take 20 mg by mouth at bedtime.    . Sodium Phosphates (RA SALINE ENEMA RE) Place 1 Container rectally See admin instructions. If not  relieved by bisacodyl supp. Give disposable saline enema rectally x1 dose 24 hours prn constipation     . timolol (TIMOPTIC) 0.5 % ophthalmic solution Place 1 drop into both eyes at bedtime.     . vitamin B-12 1000 MCG tablet Take 1 tablet (1,000 mcg total) by mouth daily. 30 tablet 0  . warfarin (COUMADIN) 2.5 MG tablet Take 2.5 mg by mouth daily.     No current facility-administered medications for this visit.    SURGICAL HISTORY:  Past Surgical History:  Procedure Laterality Date  . BREAST EXCISIONAL BIOPSY Left    x 2  . BREAST EXCISIONAL BIOPSY Right   . BRONCHIAL BIOPSY  02/18/2020   Procedure: BRONCHIAL BIOPSIES;  Surgeon: Collene Gobble,  MD;  Location: [facility] ENDOSCOPY;  Service: Cardiopulmonary;;  . BRONCHIAL BRUSHINGS  02/18/2020   Procedure: BRONCHIAL BRUSHINGS;  Surgeon: Collene Gobble, MD;  Location: [place];  Service: Cardiopulmonary;;  . BRONCHIAL NEEDLE ASPIRATION BIOPSY  02/18/2020   Procedure: BRONCHIAL NEEDLE ASPIRATION BIOPSIES;  Surgeon: Collene Gobble, MD;  Location: [place];  Service: Cardiopulmonary;;  . BRONCHIAL WASHINGS  02/18/2020   Procedure: BRONCHIAL WASHINGS;  Surgeon: Collene Gobble, MD;  Location: [place];  Service: Cardiopulmonary;;  . CARDIOVERSION N/A 01/09/2018   Procedure: CARDIOVERSION;  Surgeon: Josue Hector, MD;  Location[facility] ENDOSCOPY;  Service: Cardiovascular;  Laterality: N/A;  . CARDIOVERSION N/A 03/01/2018   Procedure: CARDIOVERSION;  Surgeon: Sueanne Margarita, MD;  Location: [facility] ENDOSCOPY;  Service: Cardiovascular;  Laterality: N/A;  . ELECTROMAGNETIC NAVIGATION BROCHOSCOPY N/A 02/18/2020   Procedure: ELECTROMAGNETIC NAVIGATION BRONCHOSCOPY;  Surgeon: Collene Gobble, MD;  Location: [place];  Service: Cardiopulmonary;  Laterality: N/A;  . KNEE SURGERY    . LUNG LOBECTOMY Right 2000   RLL removed  . VIDEO BRONCHOSCOPY N/A 02/18/2020   Procedure: VIDEO BRONCHOSCOPY WITH FLUORO;  Surgeon: Collene Gobble, MD;  Location: [facility] ENDOSCOPY;  Service: Cardiopulmonary;  Laterality: N/A;  . VIDEO BRONCHOSCOPY WITH ENDOBRONCHIAL ULTRASOUND N/A 04/22/2020   Procedure: VIDEO BRONCHOSCOPY WITH ENDOBRONCHIAL ULTRASOUND;  Surgeon: [name], MD;  Location: [place];  Service: Thoracic;  Laterality: N/A;    REVIEW OF SYSTEMS:  Constitutional: positive for fatigue Eyes: negative Ears, nose, mouth, throat, and face: negative Respiratory: positive for dyspnea on exertion Cardiovascular: negative Gastrointestinal: negative Genitourinary:negative Integument/breast: negative Hematologic/lymphatic: negative Musculoskeletal:negative Neurological: negative Behavioral/Psych:  negative Endocrine: negative Allergic/Immunologic: negative   PHYSICAL EXAMINATION: General appearance: alert, cooperative, fatigued and no distress Head: Normocephalic, without obvious abnormality, atraumatic Neck: no adenopathy, no JVD, supple, symmetrical, trachea midline and thyroid not enlarged, symmetric, no tenderness/mass/nodules Lymph nodes: Cervical, supraclavicular, and axillary nodes normal. Resp: clear to auscultation bilaterally Back: symmetric, no curvature. ROM normal. No CVA tenderness. Cardio: regular rate and rhythm, S1, S2 normal, no murmur, click, rub or gallop GI: soft, non-tender; bowel sounds normal; no masses,  no organomegaly Extremities: extremities normal, atraumatic, no cyanosis or edema Neurologic: Alert and oriented X 3, normal strength and tone. Normal symmetric reflexes. Normal coordination and gait  ECOG PERFORMANCE STATUS: 1 - Symptomatic but completely ambulatory  Blood pressure 128/85, pulse 100, temperature 97.7 F (36.5 C), temperature source Tympanic, resp. rate 18, SpO2 97 %.  LABORATORY DATA: Lab Results  Component Value Date   WBC 11.2 (H) 08/03/2020   HGB 10.4 (L) 08/03/2020   HCT 32.7 (L) 08/03/2020   MCV 91.6 08/03/2020   PLT 175 08/03/2020      Chemistry      Component Value  Date/Time   NA 140 08/03/2020 1329   NA 139 07/13/2020 0000   K 2.8 (L) 08/03/2020 1329   CL 102 08/03/2020 1329   CO2 27 08/03/2020 1329   BUN 5 (L) 08/03/2020 1329   BUN 6 07/13/2020 0000   CREATININE 0.56 08/03/2020 1329   GLU 84 07/13/2020 0000      Component Value Date/Time   CALCIUM 9.1 08/03/2020 1329   ALKPHOS 85 08/03/2020 1329   AST 19 08/03/2020 1329   ALT 19 08/03/2020 1329   BILITOT 0.7 08/03/2020 1329       RADIOGRAPHIC STUDIES: CT Chest W Contrast  Result Date: 08/01/2020 CLINICAL DATA:  78 year old female with history of small cell lung cancer. Restaging examination. EXAM: CT CHEST WITH CONTRAST TECHNIQUE: Multidetector CT  imaging of the chest was performed during intravenous contrast administration. CONTRAST:  49mL OMNIPAQUE IOHEXOL 300 MG/ML  SOLN COMPARISON:  Chest CT 06/19/2020. FINDINGS: Cardiovascular: Heart size is normal. There is no significant pericardial fluid, thickening or pericardial calcification. There is aortic atherosclerosis, as well as atherosclerosis of the great vessels of the mediastinum and the coronary arteries, including calcified atherosclerotic plaque in the left anterior descending and right coronary arteries. Mediastinum/Nodes: No pathologically enlarged mediastinal or hilar lymph nodes. Esophagus is unremarkable in appearance. No axillary lymphadenopathy. Lungs/Pleura: Previously noted suprahilar nodule in the right upper lobe has decreased in size, currently measuring 9 x 8 mm (axial image 38 of series 8), previously 1.7 x 1.2 cm on 06/19/2020. Likewise, nodular pleural base soft tissue in the medial aspect of the apex of the right hemithorax (axial image 23 of series 2) has also slightly decreased in size compared to the prior study measuring 9 x 12 mm on today's examination (previously 1.4 x 1.7 cm). No other new suspicious appearing pulmonary nodules or masses are noted. Small right pleural effusion lying dependently. No left pleural effusion. Status post right middle lobectomy. Compensatory hyperexpansion of the right upper and lower lobes. Patchy areas of ground-glass attenuation and septal thickening are noted in the lungs bilaterally, most evident in the right lower lobe, compatible with mild fibrosis. No acute consolidative airspace disease. Upper Abdomen: Aortic atherosclerosis. Mild diffuse low attenuation throughout the visualized hepatic parenchyma, indicative of hepatic steatosis. Subcentimeter low-attenuation lesions in the liver, too small to characterize, but statistically likely to represent tiny cysts. Multifocal cortical thinning in the left kidney with multiple subcentimeter  low-attenuation lesions in the left kidney, too small to characterize, but statistically likely to represent tiny cysts. Musculoskeletal: There are no aggressive appearing lytic or blastic lesions noted in the visualized portions of the skeleton. IMPRESSION: 1. Today's study demonstrates regression of disease both in terms of the suprahilar nodule in the right upper lobe and pleural based nodule in the medial aspect of the upper right hemithorax. No new suspicious findings are otherwise identified. 2. Stable small right pleural effusion. 3. Aortic atherosclerosis, in addition to two vessel coronary artery disease. Please note that although the presence of coronary artery calcium documents the presence of coronary artery disease, the severity of this disease and any potential stenosis cannot be assessed on this non-gated CT examination. Assessment for potential risk factor modification, dietary therapy or pharmacologic therapy may be warranted, if clinically indicated. 4. Hepatic steatosis. 5. Additional incidental findings, as above. Aortic Atherosclerosis (ICD10-I70.0). Electronically Signed   By: Vinnie Langton M.D.   On: 08/01/2020 16:46   DG ABD ACUTE 2+V W 1V CHEST  Result Date: 07/24/2020 CLINICAL DATA:  Abdominal discomfort  EXAM: DG ABDOMEN ACUTE WITH 1 VIEW CHEST COMPARISON:  Chest CT 06/19/2020 FINDINGS: Moderate colonic stool mainly seen in the ascending colon and rectum. No rectal impaction. No evidence of small bowel obstruction. No concerning mass effect or gas collection. Cardiomegaly. Aortic atherosclerosis. 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 There is no edema, consolidation, effusion, or pneumothorax. IMPRESSION: No acute finding in the chest or abdomen. Electronically Signed   By: Monte Fantasia M.D.   On: 07/24/2020 07:35    ASSESSMENT AND PLAN: This is a very pleasant 78 years old white female with a limited stage, stage IIIb (T2a, N2, M0) small cell lung  cancer presented with right upper lobe lung mass in addition to mediastinal lymphadenopathy diagnosed in June 2021. The patient is currently undergoing treatment with systemic chemotherapy with carboplatin for AUC of 5 on day 1 and 2 etoposide 100 mg/M2 on days 1, 2 and 3 with Neulasta support.  Status post 4 cycles. She tolerated her treatment well except for the pancytopenia. Her blood count has recovered well over the last week after her hospitalization. She continued to have significant hypokalemia. The patient had repeat CT scan of the chest performed recently.  I personally and independently reviewed the scans and discussed the results with the patient and her stepdaughter today. Her scan showed further improvement of her disease. I recommended for the patient to continue on observation from now on unless the future studies showed any disease recurrence. I will see her back for follow-up visit in 3 months for evaluation with repeat CT scan of the chest. For the hypokalemia, I will arrange for the patient to stay on potassium chloride 40 mEq p.o. daily for the next 10 days. The patient was advised to call immediately if she has any other concerning symptoms in the interval The patient was advised to call immediately if she has any other concerning symptoms in the interval. The patient voices understanding of current disease status and treatment options and is in agreement with the current care plan.  All questions were answered. The patient knows to call the clinic with any problems, questions or concerns. We can certainly see the patient much sooner if necessary.  Disclaimer: This note was dictated with voice recognition software. Similar sounding words can inadvertently be transcribed and may not be corrected upon review.

## 2020-08-04 ENCOUNTER — Ambulatory Visit: Payer: Medicare HMO

## 2020-08-05 ENCOUNTER — Ambulatory Visit: Payer: Medicare HMO

## 2020-08-05 ENCOUNTER — Non-Acute Institutional Stay (SKILLED_NURSING_FACILITY): Payer: Medicare HMO | Admitting: Orthopedic Surgery

## 2020-08-05 ENCOUNTER — Encounter: Payer: Self-pay | Admitting: Orthopedic Surgery

## 2020-08-05 DIAGNOSIS — C3411 Malignant neoplasm of upper lobe, right bronchus or lung: Secondary | ICD-10-CM

## 2020-08-05 DIAGNOSIS — E782 Mixed hyperlipidemia: Secondary | ICD-10-CM

## 2020-08-05 DIAGNOSIS — I4819 Other persistent atrial fibrillation: Secondary | ICD-10-CM | POA: Diagnosis not present

## 2020-08-05 DIAGNOSIS — E876 Hypokalemia: Secondary | ICD-10-CM

## 2020-08-05 DIAGNOSIS — R531 Weakness: Secondary | ICD-10-CM | POA: Diagnosis not present

## 2020-08-05 DIAGNOSIS — I10 Essential (primary) hypertension: Secondary | ICD-10-CM | POA: Diagnosis not present

## 2020-08-05 DIAGNOSIS — R197 Diarrhea, unspecified: Secondary | ICD-10-CM

## 2020-08-05 NOTE — Progress Notes (Signed)
Location:    [facility] Room Number: 205/D Place of Service:  SNF [phone]) Provider:  Windell Moulding NP  [name], MD  Patient Care Team: [name], MD as PCP - General (Family Medicine) Lorretta Harp, MD as PCP - Cardiology (Cardiology) Gavin Pound, MD as Consulting Physician (Rheumatology) Valrie Hart, RN as Oncology Nurse Navigator  Extended Emergency Contact Information Primary Emergency Contact: [name] Mobile Phone: [phone] Relation: Daughter Secondary Emergency Contact: [name] Mobile Phone: [phone] Relation: Daughter Preferred language: Cleophus Molt Interpreter needed? No  Code Status:  Full Code Goals of care: Advanced Directive information Advanced Directives 08/05/2020  Does Patient Have a Medical Advance Directive? No  Type of Advance Directive -  Does patient want to make changes to medical advance directive? No - Patient declined  Copy of [place] in Chart? -  Would patient like information on creating a medical advance directive? -     Chief Complaint  Patient presents with  . Medical Management of Chronic Issues    Routine Visit of Medical Management     HPI:  Pt is a 79 y.o. female seen today for medical management of chronic diseases.    She is a resident of [facility], seen at bedside today. 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   Today, she is sitting in her bed eating breakfast. She is alert and orientated to self, person, place and time. Follows commands and can express needs.   She was last seen by Dr. Earlie Server, her oncologist, 12/13. Latest scan showed improvement of her disease. It has been recommended she continue observation and f/u in 3 months for repeat CT scan. Leah Velasquez was very happy to tell  me she was cancer free this morning. She is expecting to see her husband today and tell him the good news.   Still feeling fatigued at times. Hospitalized with pancytopenia 12/3 to 12/7. It has been recommended her blood thinner be held until platelet count >50K. She denies leg pain, sob or chest pain.   States her appetite has improved from last hospitalization. Expresses how she feels hungry. She does not like the food served at [employer] and is requesting Ensure daily as a back up meal.   Still having diarrhea at night a few times a week. Claims it is less from the last time I saw her. C.Diff ruled out.   Recorded weights are as follows:  12/9- 203.4 lbs  11/8- 225.8 lbs  10/7- 225.4 lbs  No recent injuries or falls. Remains maximal assist with transfers. Able to feed self.   Facility nurse does not report any concerns at this time.   Past Medical History:  Diagnosis Date  . A-fib ([place])   . Dysrhythmia   . GERD (gastroesophageal reflux disease)   . Hemorrhoids   . Hyperlipidemia   . Hypertension   . Lung cancer ([place]) 2000   RLL  . Obesity   . Persistent atrial fibrillation ([place])   . Rheumatoid arthritis [facility])    Past Surgical History:  Procedure Laterality Date  . BREAST EXCISIONAL BIOPSY Left    x 2  . BREAST EXCISIONAL BIOPSY Right   . BRONCHIAL BIOPSY  02/18/2020   Procedure: BRONCHIAL BIOPSIES;  Surgeon: Collene Gobble, MD;  Location: [facility] ENDOSCOPY;  Service: Cardiopulmonary;;  . BRONCHIAL BRUSHINGS  02/18/2020   Procedure: BRONCHIAL BRUSHINGS;  Surgeon: Collene Gobble, MD;  Location: [place];  Service: Cardiopulmonary;;  . BRONCHIAL NEEDLE ASPIRATION BIOPSY  02/18/2020   Procedure: BRONCHIAL NEEDLE ASPIRATION BIOPSIES;  Surgeon: Collene Gobble, MD;  Location: [place];  Service: Cardiopulmonary;;  . BRONCHIAL WASHINGS  02/18/2020   Procedure: BRONCHIAL WASHINGS;  Surgeon: Collene Gobble, MD;  Location: [place];  Service: Cardiopulmonary;;  .  CARDIOVERSION N/A 01/09/2018   Procedure: CARDIOVERSION;  Surgeon: Josue Hector, MD;  Location: [facility] ENDOSCOPY;  Service: Cardiovascular;  Laterality: N/A;  . CARDIOVERSION N/A 03/01/2018   Procedure: CARDIOVERSION;  Surgeon: Sueanne Margarita, MD;  Location: [facility] ENDOSCOPY;  Service: Cardiovascular;  Laterality: N/A;  . ELECTROMAGNETIC NAVIGATION BROCHOSCOPY N/A 02/18/2020   Procedure: ELECTROMAGNETIC NAVIGATION BRONCHOSCOPY;  Surgeon: Collene Gobble, MD;  Location: [place];  Service: Cardiopulmonary;  Laterality: N/A;  . KNEE SURGERY    . LUNG LOBECTOMY Right 2000   RLL removed  . VIDEO BRONCHOSCOPY N/A 02/18/2020   Procedure: VIDEO BRONCHOSCOPY WITH FLUORO;  Surgeon: Collene Gobble, MD;  Location[facility] ENDOSCOPY;  Service: Cardiopulmonary;  Laterality: N/A;  . VIDEO BRONCHOSCOPY WITH ENDOBRONCHIAL ULTRASOUND N/A 04/22/2020   Procedure: VIDEO BRONCHOSCOPY WITH ENDOBRONCHIAL ULTRASOUND;  Surgeon: [name], MD;  Location: [facility] [place] OR;  Service: Thoracic;  Laterality: N/A;    Allergies  Allergen Reactions  . Morphine Anaphylaxis  . Morphine And Related Other (See Comments)    "vitals go down", pass out  . Sulfa Antibiotics Rash    Allergies as of 08/05/2020      Reactions   Morphine Anaphylaxis   Morphine And Related Other (See Comments)   "vitals go down", pass out   Sulfa Antibiotics Rash      Medication List       Accurate as of August 05, 2020  8:42 AM. If you have any questions, ask your nurse or doctor.        STOP taking these medications   OXYGEN Stopped by: Yvonna Alanis, NP   potassium chloride SA 20 MEQ tablet Commonly known as: KLOR-CON Stopped by: Yvonna Alanis, NP     TAKE these medications   acetaminophen 325 MG tablet Commonly known as: TYLENOL Take 650 mg by mouth every 6 (six) hours as needed for mild pain.   bisacodyl 10 MG suppository Commonly known as: DULCOLAX Place 10 mg rectally See admin instructions. If not relieved by MOM give 10mg   Bisacodyl supp. Rectally for x1 dose in 24 hours prn constipation   cyanocobalamin 1000 MCG tablet Take 1 tablet (1,000 mcg total) by mouth daily.   diclofenac Sodium 1 % Gel Commonly known as: Voltaren Apply 4 g topically 4 (four) times daily. To bilateral knees and ankles   gabapentin 100 MG capsule Commonly known as: NEURONTIN Take 100 mg by mouth at bedtime.   levocetirizine 5 MG tablet Commonly known as: XYZAL Take 5 mg by mouth daily.   metoprolol succinate 25 MG 24 hr tablet Commonly known as: TOPROL-XL Take 12.5 mg by mouth at bedtime.   Myrbetriq 25 MG Tb24 tablet Generic drug: mirabegron ER Take 25 mg by mouth daily.   NON FORMULARY Initiate 4oz Medpass qd d/t weight loss (prefers butter pecan).   NON FORMULARY Magic Cup L/D Meals.   prochlorperazine 10 MG tablet Commonly known as: COMPAZINE Take 10 mg by mouth every 6 (six) hours as needed for nausea or vomiting.   RA SALINE ENEMA RE Place 1 Container  rectally See admin instructions. If not relieved by bisacodyl supp. Give disposable saline enema rectally x1 dose 24 hours prn constipation   simvastatin 20 MG tablet Commonly known as: ZOCOR Take 20 mg by mouth at bedtime.   timolol 0.5 % ophthalmic solution Commonly known as: TIMOPTIC Place 1 drop into both eyes at bedtime.   warfarin 2.5 MG tablet Commonly known as: COUMADIN Take as directed by the anticoagulation clinic. If you are unsure how to take this medication, talk to your nurse or doctor. Original instructions: Take 2.5 mg by mouth daily.       Review of Systems  Constitutional: Positive for fatigue. Negative for activity change, appetite change and unexpected weight change.  HENT: Negative for congestion, hearing loss and trouble swallowing.   Eyes: Negative for visual disturbance.  Respiratory: Negative for cough, shortness of breath and wheezing.   Cardiovascular: Negative for chest pain and leg swelling.  Gastrointestinal: Positive  for diarrhea. Negative for abdominal pain, constipation and nausea.  Genitourinary: Negative for dysuria and hematuria.  Musculoskeletal: Positive for arthralgias and joint swelling.  Skin:       Dry skin  Neurological: Positive for weakness. Negative for dizziness, numbness and headaches.  Hematological: Bruises/bleeds easily.  Psychiatric/Behavioral: Negative for dysphoric mood and sleep disturbance. The patient is not nervous/anxious.     Immunization History  Administered Date(s) Administered  . Fluad Quad(high Dose 65+) 07/28/2020  . Influenza, High Dose Seasonal PF 06/27/2016, 05/23/2017, 06/08/2018  . Influenza-Unspecified 06/09/2020  . Moderna Sars-Covid-2 Vaccination 11/12/2019, 12/10/2019  . Pneumococcal Conjugate-13 01/04/2017  . Pneumococcal Polysaccharide-23 01/17/2018   Pertinent  Health Maintenance Due  Topic Date Due  . INFLUENZA VACCINE  Completed  . DEXA SCAN  Completed  . PNA vac Low Risk Adult  Completed   No flowsheet data found. Functional Status Survey:    Vitals:   08/05/20 0838  Pulse: 88  Resp: 18  Temp: (!) 97.1 F (36.2 C)  Weight: 203 lb 6.4 oz (92.3 kg)  Height: 5\' 1"  (1.549 m)   Body mass index is 38.43 kg/m. Physical Exam Vitals reviewed.  Constitutional:      General: She is not in acute distress.    Appearance: Normal appearance.  HENT:     Head: Normocephalic.     Right Ear: There is impacted cerumen.     Left Ear: There is impacted cerumen.     Nose: Nose normal.     Mouth/Throat:     Mouth: Mucous membranes are moist.     Pharynx: No posterior oropharyngeal erythema.  Eyes:     Extraocular Movements: Extraocular movements intact.     Pupils: Pupils are equal, round, and reactive to light.  Cardiovascular:     Rate and Rhythm: Normal rate and regular rhythm.     Pulses: Normal pulses.     Heart sounds: Normal heart sounds. No murmur heard.   Pulmonary:     Effort: Pulmonary effort is normal. No respiratory distress.      Breath sounds: Normal breath sounds. No wheezing.  Abdominal:     General: Bowel sounds are normal. There is no distension.     Palpations: Abdomen is soft.     Tenderness: There is no abdominal tenderness.  Musculoskeletal:     Cervical back: Normal range of motion. No tenderness.     Right lower leg: No edema.     Left lower leg: No edema.  Lymphadenopathy:     Cervical: No cervical adenopathy.  Skin:  General: Skin is warm and dry.     Capillary Refill: Capillary refill takes less than 2 seconds.  Neurological:     General: No focal deficit present.     Mental Status: She is alert and oriented to person, place, and time. Mental status is at baseline.     Motor: Weakness present.     Gait: Gait abnormal.  Psychiatric:        Mood and Affect: Mood normal.        Behavior: Behavior normal.     Labs reviewed: Recent Labs    02/15/20 0121 02/16/20 0431 07/26/20 0724 07/27/20 0848 07/28/20 0653 08/03/20 1329  NA 138   < > 138 139 140 140  K 3.5   < > 3.2* 3.1* 3.1* 2.8*  CL 102   < > 105 105 105 102  CO2 23   < > 21* 23 24 27   GLUCOSE 99   < > 87 81 79 95  BUN 12   < > 11 11 10  5*  CREATININE 0.82   < > 0.39* 0.40* 0.42* 0.56  CALCIUM 9.0   < > 8.3* 8.5* 8.8* 9.1  MG 1.9   < > 1.5* 1.4* 1.6*  --   PHOS 3.0  --  2.4*  --   --   --    < > = values in this interval not displayed.   Recent Labs    07/25/20 0124 07/26/20 0724 07/27/20 0848 08/03/20 1329  AST 13*  --  14* 19  ALT 13  --  14 19  ALKPHOS 68  --  74 85  BILITOT 1.5*  --  0.9 0.7  PROT 5.6*  --  5.7* 6.3*  ALBUMIN 2.7* 2.7* 2.7* 2.5*   Recent Labs    07/27/20 0848 07/28/20 0653 08/03/20 1329  WBC 5.9 8.9 11.2*  NEUTROABS 3.1 5.3 7.2  HGB 9.2* 9.4* 10.4*  HCT 29.5* 30.6* 32.7*  MCV 92.2 92.4 91.6  PLT 46* 38* 175   Lab Results  Component Value Date   TSH 2.458 02/15/2020   Lab Results  Component Value Date   HGBA1C 5.7 (H) 02/15/2020   No results found for: CHOL, HDL, LDLCALC,  LDLDIRECT, TRIG, CHOLHDL  Significant Diagnostic Results in last 30 days:  CT Chest W Contrast  Result Date: 08/01/2020 CLINICAL DATA:  79 year old female with history of small cell lung cancer. Restaging examination. EXAM: CT CHEST WITH CONTRAST TECHNIQUE: Multidetector CT imaging of the chest was performed during intravenous contrast administration. CONTRAST:  34mL OMNIPAQUE IOHEXOL 300 MG/ML  SOLN COMPARISON:  Chest CT 06/19/2020. FINDINGS: Cardiovascular: Heart size is normal. There is no significant pericardial fluid, thickening or pericardial calcification. There is aortic atherosclerosis, as well as atherosclerosis of the great vessels of the mediastinum and the coronary arteries, including calcified atherosclerotic plaque in the left anterior descending and right coronary arteries. Mediastinum/Nodes: No pathologically enlarged mediastinal or hilar lymph nodes. Esophagus is unremarkable in appearance. No axillary lymphadenopathy. Lungs/Pleura: Previously noted suprahilar nodule in the right upper lobe has decreased in size, currently measuring 9 x 8 mm (axial image 38 of series 8), previously 1.7 x 1.2 cm on 06/19/2020. Likewise, nodular pleural base soft tissue in the medial aspect of the apex of the right hemithorax (axial image 23 of series 2) has also slightly decreased in size compared to the prior study measuring 9 x 12 mm on today's examination (previously 1.4 x 1.7 cm). No other new suspicious appearing pulmonary nodules or  masses are noted. Small right pleural effusion lying dependently. No left pleural effusion. Status post right middle lobectomy. Compensatory hyperexpansion of the right upper and lower lobes. Patchy areas of ground-glass attenuation and septal thickening are noted in the lungs bilaterally, most evident in the right lower lobe, compatible with mild fibrosis. No acute consolidative airspace disease. Upper Abdomen: Aortic atherosclerosis. Mild diffuse low attenuation throughout  the visualized hepatic parenchyma, indicative of hepatic steatosis. Subcentimeter low-attenuation lesions in the liver, too small to characterize, but statistically likely to represent tiny cysts. Multifocal cortical thinning in the left kidney with multiple subcentimeter low-attenuation lesions in the left kidney, too small to characterize, but statistically likely to represent tiny cysts. Musculoskeletal: There are no aggressive appearing lytic or blastic lesions noted in the visualized portions of the skeleton. IMPRESSION: 1. Today's study demonstrates regression of disease both in terms of the suprahilar nodule in the right upper lobe and pleural based nodule in the medial aspect of the upper right hemithorax. No new suspicious findings are otherwise identified. 2. Stable small right pleural effusion. 3. Aortic atherosclerosis, in addition to two vessel coronary artery disease. Please note that although the presence of coronary artery calcium documents the presence of coronary artery disease, the severity of this disease and any potential stenosis cannot be assessed on this non-gated CT examination. Assessment for potential risk factor modification, dietary therapy or pharmacologic therapy may be warranted, if clinically indicated. 4. Hepatic steatosis. 5. Additional incidental findings, as above. Aortic Atherosclerosis (ICD10-I70.0). Electronically Signed   By: Vinnie Langton M.D.   On: 08/01/2020 16:46   DG ABD ACUTE 2+V W 1V CHEST  Result Date: 07/24/2020 CLINICAL DATA:  Abdominal discomfort EXAM: DG ABDOMEN ACUTE WITH 1 VIEW CHEST COMPARISON:  Chest CT 06/19/2020 FINDINGS: Moderate colonic stool mainly seen in the ascending colon and rectum. No rectal impaction. No evidence of small bowel obstruction. No concerning mass effect or gas collection. Cardiomegaly. Aortic atherosclerosis. 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 There is no edema, consolidation, effusion, or  pneumothorax. IMPRESSION: No acute finding in the chest or abdomen. Electronically Signed   By: Monte Fantasia M.D.   On: 07/24/2020 07:35    Assessment/Plan 1. Persistent atrial fibrillation (HCC) - stable without anticoagulant, asymptomatic - will restart warfarin when plt count > 50k - next cbc/diff 12/20  2. Generalized weakness - stable, suspect associated to low blood count  3. Essential hypertension - bp at goal, continue metoprolol  4. Mixed hyperlipidemia - stable with statin, continue regimen  5. Diarrhea, unspecified type - ongoing since starting cancer treatment - seems to be improving since treatments have stopped - recommend oral hydration daily with water  - c.diff r/o last month - may consider imodium if diarrhea persists  6. Hypokalemia - suspect due to diarrhea - KCL 40 meg BID x 5 days started 12/13 - recheck bmp- 12/16  7. Small cell lung cancer, right upper lobe ([place]) - followed by Dr. Earlie Server - stable at this time - recommendation to f/u in 3 months and obtain CT scan    Family/ staff Communication: Plan discussed with patient and facility nurse  Labs/tests ordered:  bmp

## 2020-08-06 LAB — COMPREHENSIVE METABOLIC PANEL
Calcium: 8.7 (ref 8.7–10.7)
GFR calc Af Amer: >90
GFR calc non Af Amer: >90

## 2020-08-06 LAB — BASIC METABOLIC PANEL WITH GFR
BUN: 8 (ref 4–21)
CO2: 24 — AB (ref 13–22)
Chloride: 103 (ref 99–108)
Creatinine: 0.4 — AB (ref 0.5–1.1)
Glucose: 85
Potassium: 4.7 (ref 3.4–5.3)
Sodium: 138 (ref 137–147)

## 2020-08-07 ENCOUNTER — Ambulatory Visit: Payer: Medicare HMO

## 2020-08-10 LAB — BASIC METABOLIC PANEL
BUN: 7 (ref 4–21)
CO2: 24 — AB (ref 13–22)
Chloride: 104 (ref 99–108)
Creatinine: 0.5 (ref 0.5–1.1)
Glucose: 88
Potassium: 3.6 (ref 3.4–5.3)
Sodium: 139 (ref 137–147)

## 2020-08-10 LAB — COMPREHENSIVE METABOLIC PANEL
Calcium: 8.8 (ref 8.7–10.7)
GFR calc Af Amer: >90
GFR calc non Af Amer: >90

## 2020-08-10 LAB — CBC AND DIFFERENTIAL
HCT: 29 — AB (ref 36–46)
Hemoglobin: 9.5 — AB (ref 12.0–16.0)
Platelets: 209 (ref 150–399)
WBC: 6.6

## 2020-08-10 LAB — CBC: RBC: 3.2 — AB (ref 3.87–5.11)

## 2020-08-15 LAB — CBC: RBC: 3.45 — AB (ref 3.87–5.11)

## 2020-08-15 LAB — CBC AND DIFFERENTIAL
HCT: 32 — AB (ref 36–46)
Hemoglobin: 10.4 — AB (ref 12.0–16.0)
Platelets: 133 — AB (ref 150–399)
WBC: 9.7

## 2020-08-15 LAB — POCT INR: INR: 1.1 (ref 0.9–1.1)

## 2020-08-17 LAB — CBC AND DIFFERENTIAL
HCT: 28 — AB (ref 36–46)
Hemoglobin: 9.5 — AB (ref 12.0–16.0)
Platelets: 113 — AB (ref 150–399)
WBC: 6.4

## 2020-08-17 LAB — BASIC METABOLIC PANEL
BUN: 8 (ref 4–21)
CO2: 26 — AB (ref 13–22)
Chloride: 103 (ref 99–108)
Creatinine: 0.3 — AB (ref 0.5–1.1)
Glucose: 82
Potassium: 2.9 — AB (ref 3.4–5.3)
Sodium: 140 (ref 137–147)

## 2020-08-17 LAB — COMPREHENSIVE METABOLIC PANEL
Calcium: 8.3 — AB (ref 8.7–10.7)
GFR calc Af Amer: >90
GFR calc non Af Amer: >90

## 2020-08-17 LAB — CBC: RBC: 3.05 — AB (ref 3.87–5.11)

## 2020-08-25 LAB — BASIC METABOLIC PANEL
BUN: 8 (ref 4–21)
CO2: 25 — AB (ref 13–22)
Chloride: 102 (ref 99–108)
Creatinine: 0.3 — AB (ref 0.5–1.1)
Glucose: 80
Potassium: 3.7 (ref 3.4–5.3)
Sodium: 138 (ref 137–147)

## 2020-08-25 LAB — CBC AND DIFFERENTIAL
HCT: 28 — AB (ref 36–46)
Hemoglobin: 9.5 — AB (ref 12.0–16.0)
Platelets: 177 (ref 150–399)
WBC: 6.8

## 2020-08-25 LAB — CBC: RBC: 3.01 — AB (ref 3.87–5.11)

## 2020-08-25 LAB — COMPREHENSIVE METABOLIC PANEL
Calcium: 8 — AB (ref 8.7–10.7)
GFR calc Af Amer: >90
GFR calc non Af Amer: >90

## 2020-08-26 LAB — HEPATIC FUNCTION PANEL
ALT: 9 (ref 7–35)
AST: 18 (ref 13–35)
Alkaline Phosphatase: 101 (ref 25–125)
Bilirubin, Total: 0.4

## 2020-08-26 LAB — BASIC METABOLIC PANEL
BUN: 7 (ref 4–21)
CO2: 27 — AB (ref 13–22)
Chloride: 102 (ref 99–108)
Creatinine: 0.4 — AB (ref 0.5–1.1)
Glucose: 97
Potassium: 3.5 (ref 3.4–5.3)
Sodium: 140 (ref 137–147)

## 2020-08-26 LAB — COMPREHENSIVE METABOLIC PANEL
Albumin: 2.5 — AB (ref 3.5–5.0)
Calcium: 8.5 — AB (ref 8.7–10.7)
GFR calc Af Amer: >90
GFR calc non Af Amer: >90
Globulin: 3

## 2020-08-31 ENCOUNTER — Non-Acute Institutional Stay (SKILLED_NURSING_FACILITY): Payer: Medicare HMO | Admitting: Orthopedic Surgery

## 2020-08-31 ENCOUNTER — Encounter: Payer: Self-pay | Admitting: Orthopedic Surgery

## 2020-08-31 DIAGNOSIS — R531 Weakness: Secondary | ICD-10-CM

## 2020-08-31 DIAGNOSIS — I10 Essential (primary) hypertension: Secondary | ICD-10-CM

## 2020-08-31 DIAGNOSIS — E782 Mixed hyperlipidemia: Secondary | ICD-10-CM

## 2020-08-31 DIAGNOSIS — D696 Thrombocytopenia, unspecified: Secondary | ICD-10-CM

## 2020-08-31 DIAGNOSIS — R238 Other skin changes: Secondary | ICD-10-CM

## 2020-08-31 DIAGNOSIS — R197 Diarrhea, unspecified: Secondary | ICD-10-CM

## 2020-08-31 DIAGNOSIS — I4819 Other persistent atrial fibrillation: Secondary | ICD-10-CM

## 2020-08-31 DIAGNOSIS — C3411 Malignant neoplasm of upper lobe, right bronchus or lung: Secondary | ICD-10-CM

## 2020-08-31 NOTE — Progress Notes (Signed)
Location:    [place] Room Number: 205/D Place of Service:  SNF [phone]) Provider:  Windell Moulding, AGNP-C  [name], MD  Patient Care Team: [name], MD as PCP - General (Family Medicine) Lorretta Harp, MD as PCP - Cardiology (Cardiology) Gavin Pound, MD as Consulting Physician (Rheumatology) Valrie Hart, RN as Oncology Nurse Navigator  Extended Emergency Contact Information Primary Emergency Contact: [name] Mobile Phone: [phone] Relation: Daughter Secondary Emergency Contact: [name] Mobile Phone: [phone] Relation: Daughter Preferred language: English Interpreter needed? No  Code Status: Full Code  Goals of care: Advanced Directive information Advanced Directives 08/31/2020  Does Patient Have a Medical Advance Directive? No  Type of Advance Directive -  Does patient want to make changes to medical advance directive? No - Patient declined  Copy of [place] in Chart? -  Would patient like information on creating a medical advance directive? -     Chief Complaint  Patient presents with  . Medical Management of Chronic Issues    Routine Visit of Medical Management    HPI:  Pt is a 80 y.o. female seen today for medical management of chronic diseases.    She is a resident of [facility], seen at bedside today. PMH includes: atrial fibrillation, hypertension, small cell lung cancer RUL, rheumatoid arthritis, pancytopenia, and generalized weakness.   Today, she is laying in her bed eating breakfast. She is alert and oriented x 3. Follows commands and can express needs. States her husband is out of the hospital and has visited her a few times. She is upset she is still at [facility], would like to go home. Diarrhea has stopped. She is trying to eat 2-3 meals a day. Ate 50 percent of her breakfast during our encounter. Right knee was painful this morning. Thinks she  slept awkwardly, aide repositioned her in bed for breakfast and now she denies knee pain.   Hair done recently and now her scalp is dry. C/o itching at times. Requesting interventions to help with itch.   Dr. Earlie Server, oncology, advised 3 month f/u with CT chest. She denies shortness of breath, but admits to shallow breathing at times.   Recent blood pressures are as follows:   01/10- 142/78  01/09- 134/85  01/08- 132/62  Recent weights are as follows:    Normally re-weighed in middle of month  12/30- 203.4 lbs  12/08- 202.8 lbs  Coumadin continues to he held due to low platelet count. BMP to be rechecked tomorrow.   No recent injuries or falls. States she gets out of bed daily. Uses wheelchair to ambulate. I pass her room many times and have not seen her out of bed. Minimal assist with ADL's.   Facility nurse does not report any concerns, vitals stable.     Past Medical History:  Diagnosis Date  . A-fib ([place])   . Dysrhythmia   . GERD (gastroesophageal reflux disease)   . Hemorrhoids   . Hyperlipidemia   . Hypertension   . Lung cancer ([place]) 2000   RLL  . Obesity   . Persistent atrial fibrillation ([place])   . Rheumatoid arthritis [facility])    Past Surgical History:  Procedure Laterality Date  . BREAST EXCISIONAL BIOPSY Left    x 2  . BREAST EXCISIONAL BIOPSY Right   . BRONCHIAL BIOPSY  02/18/2020   Procedure: BRONCHIAL BIOPSIES;  Surgeon: Collene Gobble, MD;  [facility] ENDOSCOPY;  Service: Cardiopulmonary;;  . BRONCHIAL BRUSHINGS  02/18/2020   Procedure: BRONCHIAL BRUSHINGS;  Surgeon: Collene Gobble, MD;  Location: [place];  Service: Cardiopulmonary;;  . BRONCHIAL NEEDLE ASPIRATION BIOPSY  02/18/2020   Procedure: BRONCHIAL NEEDLE ASPIRATION BIOPSIES;  Surgeon: Collene Gobble, MD;  Location: [place];  Service: Cardiopulmonary;;  . BRONCHIAL WASHINGS  02/18/2020   Procedure: BRONCHIAL WASHINGS;  Surgeon: Collene Gobble, MD;  Location: [place];  Service:  Cardiopulmonary;;  . CARDIOVERSION N/A 01/09/2018   Procedure: CARDIOVERSION;  Surgeon: Josue Hector, MD;  Location: [facility] ENDOSCOPY;  Service: Cardiovascular;  Laterality: N/A;  . CARDIOVERSION N/A 03/01/2018   Procedure: CARDIOVERSION;  Surgeon: Sueanne Margarita, MD;  Location: [facility] ENDOSCOPY;  Service: Cardiovascular;  Laterality: N/A;  . ELECTROMAGNETIC NAVIGATION BROCHOSCOPY N/A 02/18/2020   Procedure: ELECTROMAGNETIC NAVIGATION BRONCHOSCOPY;  Surgeon: Collene Gobble, MD;  Location: [place];  Service: Cardiopulmonary;  Laterality: N/A;  . KNEE SURGERY    . LUNG LOBECTOMY Right 2000   RLL removed  . VIDEO BRONCHOSCOPY N/A 02/18/2020   Procedure: VIDEO BRONCHOSCOPY WITH FLUORO;  Surgeon: Collene Gobble, MD;  Location: [facility] ENDOSCOPY;  Service: Cardiopulmonary;  Laterality: N/A;  . VIDEO BRONCHOSCOPY WITH ENDOBRONCHIAL ULTRASOUND N/A 04/22/2020   Procedure: VIDEO BRONCHOSCOPY WITH ENDOBRONCHIAL ULTRASOUND;  Surgeon: [name], MD;  Location: [facility] OR;  Service: Thoracic;  Laterality: N/A;    Allergies  Allergen Reactions  . Morphine Anaphylaxis  . Morphine And Related Other (See Comments)    "vitals go down", pass out  . Sulfa Antibiotics Rash    Allergies as of 08/31/2020      Reactions   Morphine Anaphylaxis   Morphine And Related Other (See Comments)   "vitals go down", pass out   Sulfa Antibiotics Rash      Medication List       Accurate as of August 31, 2020 11:52 AM. If you have any questions, ask your nurse or doctor.        acetaminophen 325 MG tablet Commonly known as: TYLENOL Take 650 mg by mouth every 6 (six) hours as needed for mild pain.   bisacodyl 10 MG suppository Commonly known as: DULCOLAX Place 10 mg rectally See admin instructions. If not relieved by MOM give 10mg  Bisacodyl supp. Rectally for x1 dose in 24 hours prn constipation   cyanocobalamin 1000 MCG tablet Take 1 tablet (1,000 mcg total) by mouth daily.   diclofenac Sodium 1 %  Gel Commonly known as: Voltaren Apply 4 g topically 4 (four) times daily. To bilateral knees and ankles   Ensure Take 237 mLs by mouth daily in the afternoon. Between meals   gabapentin 100 MG capsule Commonly known as: NEURONTIN Take 100 mg by mouth at bedtime.   levocetirizine 5 MG tablet Commonly known as: XYZAL Take 5 mg by mouth daily.   metoprolol succinate 25 MG 24 hr tablet Commonly known as: TOPROL-XL Take 12.5 mg by mouth at bedtime.   Myrbetriq 25 MG Tb24 tablet Generic drug: mirabegron ER Take 25 mg by mouth daily.   NON FORMULARY Initiate 4oz Medpass qd d/t weight loss (prefers butter pecan).   NON FORMULARY Magic Cup L/D Meals.   OXYGEN Inhale 2 L into the lungs continuous.   prochlorperazine 10 MG tablet Commonly known as: COMPAZINE Take 10 mg by mouth every 6 (six) hours as needed for nausea or vomiting.   RA SALINE ENEMA RE Place 1 Container rectally See admin instructions. If not relieved  by bisacodyl supp. Give disposable saline enema rectally x1 dose 24 hours prn constipation   simvastatin 20 MG tablet Commonly known as: ZOCOR Take 20 mg by mouth at bedtime.   timolol 0.5 % ophthalmic solution Commonly known as: TIMOPTIC Place 1 drop into both eyes at bedtime.   warfarin 2.5 MG tablet Commonly known as: COUMADIN Take as directed by the anticoagulation clinic. If you are unsure how to take this medication, talk to your nurse or doctor. Original instructions: Take 2.5 mg by mouth daily.       Review of Systems  Constitutional: Negative for activity change, appetite change and fever.  HENT: Negative for congestion, dental problem, hearing loss and trouble swallowing.   Eyes: Negative for visual disturbance.  Respiratory: Positive for shortness of breath. Negative for cough and wheezing.        Shallow breathing  Cardiovascular: Negative for chest pain and leg swelling.  Gastrointestinal: Negative for abdominal pain, constipation, diarrhea  and nausea.  Genitourinary: Negative for dysuria, frequency and hematuria.  Musculoskeletal: Positive for arthralgias and myalgias.  Skin:       Dry scalp  Neurological: Positive for weakness. Negative for dizziness and headaches.  Psychiatric/Behavioral: Negative for confusion and dysphoric mood. The patient is not nervous/anxious.     Immunization History  Administered Date(s) Administered  . Fluad Quad(high Dose 65+) 07/28/2020  . Influenza, High Dose Seasonal PF 06/27/2016, 05/23/2017, 06/08/2018  . Influenza-Unspecified 06/09/2020  . Moderna Sars-Covid-2 Vaccination 11/12/2019, 12/10/2019  . Pneumococcal Conjugate-13 01/04/2017  . Pneumococcal Polysaccharide-23 01/17/2018   Pertinent  Health Maintenance Due  Topic Date Due  . INFLUENZA VACCINE  Completed  . DEXA SCAN  Completed  . PNA vac Low Risk Adult  Completed   No flowsheet data found. Functional Status Survey:    Vitals:   08/31/20 0943  BP: (!) 142/78  Pulse: 83  Resp: 18  Temp: (!) 97.2 F (36.2 C)  Weight: 202 lb 12.8 oz (92 kg)  Height: 5\' 1"  (1.549 m)   Body mass index is 38.32 kg/m. Physical Exam Vitals reviewed.  Constitutional:      General: She is not in acute distress.    Comments: Pale complexion  HENT:     Head: Normocephalic.     Right Ear: There is no impacted cerumen.     Left Ear: There is no impacted cerumen.     Nose: Nose normal.     Mouth/Throat:     Mouth: Mucous membranes are moist.     Pharynx: No posterior oropharyngeal erythema.  Eyes:     General:        Right eye: No discharge.        Left eye: No discharge.     Extraocular Movements: Extraocular movements intact.     Pupils: Pupils are equal, round, and reactive to light.  Cardiovascular:     Rate and Rhythm: Normal rate. Rhythm irregular.     Pulses: Normal pulses.     Heart sounds: Normal heart sounds. No murmur heard.   Pulmonary:     Effort: Pulmonary effort is normal. No respiratory distress.     Breath  sounds: Examination of the right-lower field reveals decreased breath sounds. Examination of the left-lower field reveals decreased breath sounds. Decreased breath sounds present. No wheezing.  Abdominal:     General: Bowel sounds are normal. There is no distension.     Palpations: Abdomen is soft.     Tenderness: There is no abdominal tenderness.  Musculoskeletal:     Cervical back: Normal range of motion.     Right lower leg: No edema.     Left lower leg: No edema.  Lymphadenopathy:     Cervical: No cervical adenopathy.  Skin:    General: Skin is warm and dry.     Capillary Refill: Capillary refill takes less than 2 seconds.     Comments: Dry skin with flakes noted on scalp on hairline.   Neurological:     General: No focal deficit present.     Mental Status: She is alert and oriented to person, place, and time.     Motor: Weakness present.     Gait: Gait abnormal.  Psychiatric:        Mood and Affect: Mood normal.        Behavior: Behavior normal.     Labs reviewed: Recent Labs    02/15/20 0121 02/16/20 0431 07/26/20 0724 07/27/20 0848 07/28/20 0653 08/03/20 1329 08/06/20 0000 08/17/20 0000 08/25/20 0000 08/26/20 0000  NA 138   < > 138 139 140 140   < > 140 138 140  K 3.5   < > 3.2* 3.1* 3.1* 2.8*   < > 2.9* 3.7 3.5  CL 102   < > 105 105 105 102   < > 103 102 102  CO2 23   < > 21* 23 24 27    < > 26* 25* 27*  GLUCOSE 99   < > 87 81 79 95  --   --   --   --   BUN 12   < > 11 11 10  5*   < > 8 8 7   CREATININE 0.82   < > 0.39* 0.40* 0.42* 0.56   < > 0.3* 0.3* 0.4*  CALCIUM 9.0   < > 8.3* 8.5* 8.8* 9.1   < > 8.3* 8.0* 8.5*  MG 1.9   < > 1.5* 1.4* 1.6*  --   --   --   --   --   PHOS 3.0  --  2.4*  --   --   --   --   --   --   --    < > = values in this interval not displayed.   Recent Labs    07/25/20 0124 07/26/20 0724 07/27/20 0848 08/03/20 1329 08/26/20 0000  AST 13*  --  14* 19 18  ALT 13  --  14 19 9   ALKPHOS 68  --  74 85 101  BILITOT 1.5*  --  0.9 0.7   --   PROT 5.6*  --  5.7* 6.3*  --   ALBUMIN 2.7*   < > 2.7* 2.5* 2.5*   < > = values in this interval not displayed.   Recent Labs    07/27/20 0848 07/28/20 0653 08/03/20 1329 08/10/20 0000 08/15/20 0000 08/17/20 0000 08/25/20 0000  WBC 5.9 8.9 11.2*   < > 9.7 6.4 6.8  NEUTROABS 3.1 5.3 7.2  --   --   --   --   HGB 9.2* 9.4* 10.4*   < > 10.4* 9.5* 9.5*  HCT 29.5* 30.6* 32.7*   < > 32* 28* 28*  MCV 92.2 92.4 91.6  --   --   --   --   PLT 46* 38* 175   < > 133* 113* 177   < > = values in this interval not displayed.   Lab Results  Component Value Date   TSH  2.458 02/15/2020   Lab Results  Component Value Date   HGBA1C 5.7 (H) 02/15/2020   No results found for: CHOL, HDL, LDLCALC, LDLDIRECT, TRIG, CHOLHDL  Significant Diagnostic Results in last 30 days:  No results found.  Assessment/Plan 1. Persistent atrial fibrillation (HCC) - rate controlled, continue metoprolol - cannot start coumadin due to thrombocytopenia - will restart coumadin when plt > 50k  2. Generalized weakness - stable, suspected due to low blood count - last seen by PT/OT 12/28 - she will participate in therapy, PT reports she declines to sit up oob  3. Essential hypertension - bp at goal < 150/90 - continue metoprolol  4. Mixed hyperlipidemia - stable with statin regimen  5. Diarrhea, unspecified type - resolved at this time, last K + 3.7  6. Small cell lung cancer, right upper lobe ([place]) - followed by Dr. Earlie Server - she denies pain or increased sob  - continue f/u in 3 months for CT scan  7. Thrombocytopenia ([place]) - ongoing, plt slowly increasing every week - cbc/diff scheduled for 09/01/20 - continue to hold blood thinner until ply > 50K  8. Dry scalp - dry, flaking skin at hairline - advised to use baby shampoo  - advised to apply mositurizer or vasaline to hairline    Family/ staff Communication: Plan discussed to patient and facility nurse  Labs/tests ordered:   none

## 2020-09-01 LAB — CBC: RBC: 3.36 — AB (ref 3.87–5.11)

## 2020-09-01 LAB — CBC AND DIFFERENTIAL
HCT: 32 — AB (ref 36–46)
Hemoglobin: 10.8 — AB (ref 12.0–16.0)
Platelets: 191 (ref 150–399)
WBC: 4.5

## 2020-09-02 ENCOUNTER — Encounter: Payer: Self-pay | Admitting: Orthopedic Surgery

## 2020-09-02 ENCOUNTER — Non-Acute Institutional Stay (SKILLED_NURSING_FACILITY): Payer: Medicare HMO | Admitting: Orthopedic Surgery

## 2020-09-02 DIAGNOSIS — I4819 Other persistent atrial fibrillation: Secondary | ICD-10-CM | POA: Diagnosis not present

## 2020-09-02 NOTE — Progress Notes (Signed)
Location:   [place] of Service:    [facility]  Provider: Windell Moulding, AGNP-C     Holley Mariea Clonts, D.O., C.M.D.  [name], MD  Patient Care Team: [name], MD as PCP - General (Family Medicine) Lorretta Harp, MD as PCP - Cardiology (Cardiology) Gavin Pound, MD as Consulting Physician (Rheumatology) Valrie Hart, RN as Oncology Nurse Navigator  Extended Emergency Contact Information Primary Emergency Contact: [name] Mobile Phone: [phone] Relation: Daughter Secondary Emergency Contact: [name] Mobile Phone: [phone] Relation: Daughter Preferred language: Cleophus Molt Interpreter needed? No  Code Status:  Full Goals of care: Advanced Directive information Advanced Directives 08/31/2020  Does Patient Have a Medical Advance Directive? No  Type of Advance Directive -  Does patient want to make changes to medical advance directive? No - Patient declined  Copy of [place] in Chart? -  Would patient like information on creating a medical advance directive? -     No chief complaint on file.   HPI:  Pt is a 80 y.o. female seen today for an acute visit for thrombocytopenia.   She is a resident of [facility], seen at bedside today. PMH includes: atrial fibrillation, hypertension, small cell lung cancer, rheumatoid arthritis, pancytopenia, and generalized weakness.   She was hospitalized 12/3 for pancytopenia related to chemo treatments for small cell lung cancer. Discharge summary advised holding her warfarin until platelet count > 50K. Today, weekly cbc/diff reports increase in platelet count of 191.   Today, she is alert and oriented x 3. Follows commands and can express needs. States she feels less fatigued everyday. Her complexion has improved since chemo treatments have subsided. She denies chest pain, sob, or abnormal bleeding.    Facility nurse does not report any concerns, vitals stable.   Past Medical History:  Diagnosis Date  . A-fib ([place])   . Dysrhythmia   . GERD (gastroesophageal reflux disease)   . Hemorrhoids   . Hyperlipidemia   . Hypertension   . Lung cancer ([place]) 2000   RLL  . Obesity   . Persistent atrial fibrillation ([place])   . Rheumatoid arthritis [facility])    Past Surgical History:  Procedure Laterality Date  . BREAST EXCISIONAL BIOPSY Left    x 2  . BREAST EXCISIONAL BIOPSY Right   . BRONCHIAL BIOPSY  02/18/2020   Procedure: BRONCHIAL BIOPSIES;  Surgeon: Collene Gobble, MD;  Location: [facility] ENDOSCOPY;  Service: Cardiopulmonary;;  . BRONCHIAL BRUSHINGS  02/18/2020   Procedure: BRONCHIAL BRUSHINGS;  Surgeon: Collene Gobble, MD;  Location: [place];  Service: Cardiopulmonary;;  . BRONCHIAL NEEDLE ASPIRATION BIOPSY  02/18/2020   Procedure: BRONCHIAL NEEDLE ASPIRATION BIOPSIES;  Surgeon: Collene Gobble, MD;  Location: [place];  Service: Cardiopulmonary;;  . BRONCHIAL WASHINGS  02/18/2020   Procedure: BRONCHIAL WASHINGS;  Surgeon: Collene Gobble, MD;  Location: [place];  Service: Cardiopulmonary;;  . CARDIOVERSION N/A 01/09/2018   Procedure: CARDIOVERSION;  Surgeon: Josue Hector, MD;  Location: [facility] ENDOSCOPY;  Service: Cardiovascular;  Laterality: N/A;  . CARDIOVERSION N/A 03/01/2018   Procedure: CARDIOVERSION;  Surgeon: Sueanne Margarita, MD;  Location: [facility] ENDOSCOPY;  Service: Cardiovascular;  Laterality: N/A;  . ELECTROMAGNETIC NAVIGATION BROCHOSCOPY N/A 02/18/2020   Procedure: ELECTROMAGNETIC NAVIGATION BRONCHOSCOPY;  Surgeon: Collene Gobble, MD;  Location: [place];  Service: Cardiopulmonary;  Laterality: N/A;  . KNEE SURGERY    . LUNG LOBECTOMY  Right 2000   RLL removed  . VIDEO BRONCHOSCOPY N/A 02/18/2020   Procedure: VIDEO BRONCHOSCOPY WITH FLUORO;  Surgeon: Collene Gobble, MD;  Location[facility] ENDOSCOPY;  Service: Cardiopulmonary;  Laterality: N/A;  . VIDEO BRONCHOSCOPY WITH  ENDOBRONCHIAL ULTRASOUND N/A 04/22/2020   Procedure: VIDEO BRONCHOSCOPY WITH ENDOBRONCHIAL ULTRASOUND;  Surgeon: [name], MD;  Location: [facility] OR;  Service: Thoracic;  Laterality: N/A;    Allergies  Allergen Reactions  . Morphine Anaphylaxis  . Morphine And Related Other (See Comments)    "vitals go down", pass out  . Sulfa Antibiotics Rash    Outpatient Encounter Medications as of 09/02/2020  Medication Sig  . acetaminophen (TYLENOL) 325 MG tablet Take 650 mg by mouth every 6 (six) hours as needed for mild pain.  . bisacodyl (DULCOLAX) 10 MG suppository Place 10 mg rectally See admin instructions. If not relieved by MOM give 10mg  Bisacodyl supp. Rectally for x1 dose in 24 hours prn constipation   . diclofenac Sodium (VOLTAREN) 1 % GEL Apply 4 g topically 4 (four) times daily. To bilateral knees and ankles  . Ensure (ENSURE) Take 237 mLs by mouth daily in the afternoon. Between meals  . gabapentin (NEURONTIN) 100 MG capsule Take 100 mg by mouth at bedtime.  Marland Kitchen levocetirizine (XYZAL) 5 MG tablet Take 5 mg by mouth daily.   . metoprolol succinate (TOPROL-XL) 25 MG 24 hr tablet Take 12.5 mg by mouth at bedtime.   Marland Kitchen MYRBETRIQ 25 MG TB24 tablet Take 25 mg by mouth daily.  . NON FORMULARY Initiate 4oz Medpass qd d/t weight loss (prefers butter pecan).  . NON FORMULARY Magic Cup L/D Meals.  . OXYGEN Inhale 2 L into the lungs continuous.  . prochlorperazine (COMPAZINE) 10 MG tablet Take 10 mg by mouth every 6 (six) hours as needed for nausea or vomiting.  . simvastatin (ZOCOR) 20 MG tablet Take 20 mg by mouth at bedtime.  . Sodium Phosphates (RA SALINE ENEMA RE) Place 1 Container rectally See admin instructions. If not relieved by bisacodyl supp. Give disposable saline enema rectally x1 dose 24 hours prn constipation   . timolol (TIMOPTIC) 0.5 % ophthalmic solution Place 1 drop into both eyes at bedtime.   . vitamin B-12 1000 MCG tablet Take 1 tablet (1,000 mcg total) by mouth daily.  Marland Kitchen  warfarin (COUMADIN) 2.5 MG tablet Take 2.5 mg by mouth daily.   No facility-administered encounter medications on file as of 09/02/2020.    Review of Systems  Constitutional: Negative for fever and malaise/fatigue.  Respiratory: Negative for cough, sputum production and shortness of breath.   Cardiovascular: Negative for chest pain and palpitations.  Gastrointestinal: Negative for blood in stool.  Genitourinary: Negative for hematuria.  Psychiatric/Behavioral: Negative for depression. The patient is not nervous/anxious.     Immunization History  Administered Date(s) Administered  . Fluad Quad(high Dose 65+) 07/28/2020  . Influenza, High Dose Seasonal PF 06/27/2016, 05/23/2017, 06/08/2018  . Influenza-Unspecified 06/09/2020  . Moderna Sars-Covid-2 Vaccination 11/12/2019, 12/10/2019  . Pneumococcal Conjugate-13 01/04/2017  . Pneumococcal Polysaccharide-23 01/17/2018   Pertinent  Health Maintenance Due  Topic Date Due  . INFLUENZA VACCINE  Completed  . DEXA SCAN  Completed  . PNA vac Low Risk Adult  Completed   No flowsheet data found. Functional Status Survey:    There were no vitals filed for this visit. There is no height or weight on file to calculate BMI. Physical Exam Vitals reviewed.  Constitutional:      General: She  is not in acute distress.    Appearance: She is obese.  Cardiovascular:     Rate and Rhythm: Normal rate. Rhythm irregular.     Pulses: Normal pulses.     Heart sounds: Normal heart sounds. No murmur heard.   Pulmonary:     Effort: Pulmonary effort is normal. No respiratory distress.     Breath sounds: Normal breath sounds. No wheezing.  Skin:    General: Skin is warm and dry.     Capillary Refill: Capillary refill takes less than 2 seconds.  Neurological:     General: No focal deficit present.     Mental Status: She is alert. Mental status is at baseline.     Motor: Weakness present.     Gait: Gait abnormal.  Psychiatric:        Mood and  Affect: Mood normal.        Behavior: Behavior normal.     Labs reviewed: Recent Labs    02/15/20 0121 02/16/20 0431 07/26/20 0724 07/27/20 0848 07/28/20 0653 08/03/20 1329 08/06/20 0000 08/17/20 0000 08/25/20 0000 08/26/20 0000  NA 138   < > 138 139 140 140   < > 140 138 140  K 3.5   < > 3.2* 3.1* 3.1* 2.8*   < > 2.9* 3.7 3.5  CL 102   < > 105 105 105 102   < > 103 102 102  CO2 23   < > 21* 23 24 27    < > 26* 25* 27*  GLUCOSE 99   < > 87 81 79 95  --   --   --   --   BUN 12   < > 11 11 10  5*   < > 8 8 7   CREATININE 0.82   < > 0.39* 0.40* 0.42* 0.56   < > 0.3* 0.3* 0.4*  CALCIUM 9.0   < > 8.3* 8.5* 8.8* 9.1   < > 8.3* 8.0* 8.5*  MG 1.9   < > 1.5* 1.4* 1.6*  --   --   --   --   --   PHOS 3.0  --  2.4*  --   --   --   --   --   --   --    < > = values in this interval not displayed.   Recent Labs    07/25/20 0124 07/26/20 0724 07/27/20 0848 08/03/20 1329 08/26/20 0000  AST 13*  --  14* 19 18  ALT 13  --  14 19 9   ALKPHOS 68  --  74 85 101  BILITOT 1.5*  --  0.9 0.7  --   PROT 5.6*  --  5.7* 6.3*  --   ALBUMIN 2.7*   < > 2.7* 2.5* 2.5*   < > = values in this interval not displayed.   Recent Labs    07/27/20 0848 07/28/20 0653 08/03/20 1329 08/10/20 0000 08/15/20 0000 08/17/20 0000 08/25/20 0000  WBC 5.9 8.9 11.2*   < > 9.7 6.4 6.8  NEUTROABS 3.1 5.3 7.2  --   --   --   --   HGB 9.2* 9.4* 10.4*   < > 10.4* 9.5* 9.5*  HCT 29.5* 30.6* 32.7*   < > 32* 28* 28*  MCV 92.2 92.4 91.6  --   --   --   --   PLT 46* 38* 175   < > 133* 113* 177   < > = values in this  interval not displayed.   Lab Results  Component Value Date   TSH 2.458 02/15/2020   Lab Results  Component Value Date   HGBA1C 5.7 (H) 02/15/2020   No results found for: CHOL, HDL, LDLCALC, LDLDIRECT, TRIG, CHOLHDL  Significant Diagnostic Results in last 30 days:  No results found.  Assessment/Plan 1. Persistent atrial fibrillation (HCC) - platelet count 191 - will start warfarin 2.5 mg po  daily for dvt prophylaxis - recheck PT/INR 09/07/20   Family/ staff Communication: Plan discussed with patient and facility nurse  Labs/tests ordered:  PT/INR 08/28/2020  Marisabel Macpherson Cleophas Dunker, AGNP-C [facility] [address], [place] [address] On Call:  [phone] & follow prompts after 5pm & weekends Office Phone:  [phone] Office Fax:  [phone]

## 2020-09-03 ENCOUNTER — Non-Acute Institutional Stay: Payer: Medicare HMO | Admitting: Hospice

## 2020-09-03 ENCOUNTER — Other Ambulatory Visit: Payer: Self-pay

## 2020-09-03 DIAGNOSIS — Z515 Encounter for palliative care: Secondary | ICD-10-CM

## 2020-09-03 DIAGNOSIS — C3491 Malignant neoplasm of unspecified part of right bronchus or lung: Secondary | ICD-10-CM

## 2020-09-03 NOTE — Progress Notes (Signed)
PATIENT NAME: Leah Velasquez [address] [id number] [phone] (home)  DOB: 08/25/1940 MRN: [id number]  PRIMARY CARE PROVIDER:    [name], MD,  [place] [address] [phone]  REFERRING PROVIDER:   [name], [address],  [place] [address] [phone]  RESPONSIBLE PARTY:  Self/spouse  Extended Emergency Contact Information Primary Emergency Contact: [name] Mobile Phone: [phone] Relation: Daughter Secondary Emergency Contact: [name] Mobile Phone: [phone] Relation: Daughter Preferred language: English Interpreter needed? No  Best to call [name].  I met face to face with patient and family in home/facility.  ADVANCE CARE PLANNING/RECOMMENDATIONS/PLAN:    Visit at the request of Dr. Hollace Kinnier for palliative consult. Visit consisted of building trust and discussions on Palliative Medicine as specialized medical care for people living with serious illness, aimed at facilitating better quality of life through symptoms relief, assisting with advance care plan and establishing complex decision making. Patient endorsed palliative care service.  NP called [name] and left her a voicemail with callback number.  Discussion on the difference between Palliative and Hospice care. Palliative care and hospice have similar goals of managing symptoms, promoting comfort, improving quality of life, and maintaining a person's dignity. However, palliative care may be offered during any phase of a serious illness, while hospice care is usually offered when a person is expected to live for 6 months or less.  Visit consisted of counseling and education dealing with the complex and emotionally intense issues of symptom management and palliative care in the setting of serious and potentially life-threatening illness. Palliative care team will continue to support patient,  patient's family, and medical team.  Advance Care Planning: Our advance care planning conversation included a discussion about:    The value and importance of advance care planning  Exploration of goals of care in the event of a sudden injury or illness  Identification and preparation of a healthcare agent          Review and updating or creation of an advance directive document       CODE STATUS: Patient affirmed she is a Full code.   GOALS OF CARE: Goals of care include to maximize quality of life and symptom management.  Follow up Palliative Care Visit: Palliative care will continue to follow for goals of care clarification and symptom management.   Symptom Management:  Shortness of breath related to Lung CA: Patient denied shortness of breath during visit. She reports plan to  resume chemotherapy in March 2022. Non ambulatory, hoyer lift for all transfers.  Continue Coumadin for Afib; no report of bleeding, no palpitations no dizziness. Palliative will continue to monitor for symptom management/decline and make recommendations as needed.  Family /Caregiver/Community Supports: Patient in SNF for ongoing care  I spent 1 hour and 16 minutes providing this initial consultation; time includes time spent with patient/family, chart review, provider coordination,  and documentation. More than 50% of the time in this consultation was spent on counseling patient and coordinating communication.   CHIEF COMPLAIN/HISTORY OF PRESENT ILLNESS:  Leah Velasquez is a 80 y.o. female with multiple medical problems including Lung CA, Afib HTN History obtained from review of EMR, discussion with primary nurse and patient.   Palliative Care was asked to follow this patient by consultation request of Dr. Hollace Kinnier to help address advance care planning and complex decision making. Thank you for the opportunity to participate  in the care of Leah Velasquez    CODE STATUS: Full  PPS: 40%  HOSPICE  ELIGIBILITY/DIAGNOSIS: TBD  PAST MEDICAL HISTORY:  Past Medical History:  Diagnosis Date  . A-fib ([place])   . Dysrhythmia   . GERD (gastroesophageal reflux disease)   . Hemorrhoids   . Hyperlipidemia   . Hypertension   . Lung cancer ([place]) 2000   RLL  . Obesity   . Persistent atrial fibrillation ([place])   . Rheumatoid arthritis ([place])     SOCIAL HX:  Social History   Tobacco Use  . Smoking status: Former Research scientist (life sciences)  . Smokeless tobacco: Never Used  Substance Use Topics  . Alcohol use: No   FAMILY HX: No family history on file.   Labs:   No results for input(s): WBC, HGB, HCT, PLT, MCV in the last 168 hours. No results for input(s): NA, K, CL, CO2, BUN, CREATININE, GLUCOSE in the last 168 hours.  ALLERGIES:  Allergies  Allergen Reactions  . Morphine Anaphylaxis  . Morphine And Related Other (See Comments)    "vitals go down", pass out  . Sulfa Antibiotics Rash     PERTINENT MEDICATIONS:  Outpatient Encounter Medications as of 09/03/2020  Medication Sig  . acetaminophen (TYLENOL) 325 MG tablet Take 650 mg by mouth every 6 (six) hours as needed for mild pain.  . bisacodyl (DULCOLAX) 10 MG suppository Place 10 mg rectally See admin instructions. If not relieved by MOM give 11m Bisacodyl supp. Rectally for x1 dose in 24 hours prn constipation   . diclofenac Sodium (VOLTAREN) 1 % GEL Apply 4 g topically 4 (four) times daily. To bilateral knees and ankles  . Ensure (ENSURE) Take 237 mLs by mouth daily in the afternoon. Between meals  . gabapentin (NEURONTIN) 100 MG capsule Take 100 mg by mouth at bedtime.  .Marland Kitchenlevocetirizine (XYZAL) 5 MG tablet Take 5 mg by mouth daily.   . metoprolol succinate (TOPROL-XL) 25 MG 24 hr tablet Take 12.5 mg by mouth at bedtime.   .Marland KitchenMYRBETRIQ 25 MG TB24 tablet Take 25 mg by mouth daily.  . NON FORMULARY Initiate 4oz Medpass qd d/t weight loss (prefers butter pecan).  . NON FORMULARY Magic Cup L/D Meals.  . OXYGEN Inhale 2 L into the lungs continuous.   . prochlorperazine (COMPAZINE) 10 MG tablet Take 10 mg by mouth every 6 (six) hours as needed for nausea or vomiting.  . simvastatin (ZOCOR) 20 MG tablet Take 20 mg by mouth at bedtime.  . Sodium Phosphates (RA SALINE ENEMA RE) Place 1 Container rectally See admin instructions. If not relieved by bisacodyl supp. Give disposable saline enema rectally x1 dose 24 hours prn constipation   . timolol (TIMOPTIC) 0.5 % ophthalmic solution Place 1 drop into both eyes at bedtime.   . vitamin B-12 1000 MCG tablet Take 1 tablet (1,000 mcg total) by mouth daily.  .Marland Kitchenwarfarin (COUMADIN) 2.5 MG tablet Take 2.5 mg by mouth daily.   No facility-administered encounter medications on file as of 09/03/2020.    PHYSICAL EXAM/ROS  General: NAD, cooperative Cardiovascular: regular rate and rhythm; denies chest pain Pulmonary: No respiratory distress, normal respiratory effort on room air Abdomen: soft, nontender, + bowel sounds GU: no suprapubic tenderness Skin: no rashes to visible skin Neurological: Weakness but otherwise nonfocal  Note: Portions of this note were generated with DLobbyist Dictation errors may occur despite best attempts at proofreading.  LTeodoro Spray NP

## 2020-09-03 NOTE — Progress Notes (Signed)
A user error has taken place: encounter opened in error, closed for administrative reasons.

## 2020-09-28 ENCOUNTER — Encounter: Payer: Self-pay | Admitting: Internal Medicine

## 2020-10-01 ENCOUNTER — Encounter: Payer: Self-pay | Admitting: Orthopedic Surgery

## 2020-10-01 ENCOUNTER — Non-Acute Institutional Stay (SKILLED_NURSING_FACILITY): Payer: Medicare HMO | Admitting: Orthopedic Surgery

## 2020-10-01 DIAGNOSIS — M25562 Pain in left knee: Secondary | ICD-10-CM

## 2020-10-01 DIAGNOSIS — F0390 Unspecified dementia without behavioral disturbance: Secondary | ICD-10-CM

## 2020-10-01 DIAGNOSIS — M25561 Pain in right knee: Secondary | ICD-10-CM

## 2020-10-01 DIAGNOSIS — R634 Abnormal weight loss: Secondary | ICD-10-CM

## 2020-10-01 DIAGNOSIS — I10 Essential (primary) hypertension: Secondary | ICD-10-CM | POA: Diagnosis not present

## 2020-10-01 DIAGNOSIS — I4819 Other persistent atrial fibrillation: Secondary | ICD-10-CM

## 2020-10-01 DIAGNOSIS — E782 Mixed hyperlipidemia: Secondary | ICD-10-CM | POA: Diagnosis not present

## 2020-10-01 DIAGNOSIS — C3411 Malignant neoplasm of upper lobe, right bronchus or lung: Secondary | ICD-10-CM

## 2020-10-01 DIAGNOSIS — R531 Weakness: Secondary | ICD-10-CM | POA: Diagnosis not present

## 2020-10-01 DIAGNOSIS — G8929 Other chronic pain: Secondary | ICD-10-CM

## 2020-10-01 NOTE — Progress Notes (Unsigned)
Location:    [facility] Room Number: 205/D Place of Service:  SNF [phone]) Provider:  Windell Moulding NP  [name], MD  Patient Care Team: [name], MD as PCP - General (Family Medicine) Lorretta Harp, MD as PCP - Cardiology (Cardiology) Gavin Pound, MD as Consulting Physician (Rheumatology) Valrie Hart, RN as Oncology Nurse Navigator  Extended Emergency Contact Information Primary Emergency Contact: [name] Mobile Phone: [phone] Relation: Daughter Secondary Emergency Contact: [name] Mobile Phone: [phone] Relation: Daughter Preferred language: Cleophus Molt Interpreter needed? No  Code Status: Full Code  Goals of care: Advanced Directive information Advanced Directives 10/01/2020  Does Patient Have a Medical Advance Directive? No  Type of Advance Directive -  Does patient want to make changes to medical advance directive? No - Patient declined  Copy of [place] in Chart? -  Would patient like information on creating a medical advance directive? -     Chief Complaint  Patient presents with  . Medical Management of Chronic Issues    Routine Visit of Medical Management     HPI:  Pt is a 80 y.o. female seen today for medical management of chronic diseases.    She is a resident of [facility], seen today at bedside. PMH includes: hypertension, a-fib, lung cancer, rheumatoid arthritis, hyperlipidemia, and generalized weakness.   Today, She is alert to self and familiar faces. Disoriented to time and place. Follows commands and can express needs. She still thinks she is actively receiving chemo for her lung cancer. She last saw her oncologist in [facility] on observation. 01/17 she tested positive for covid and was placed on isolation unit. Describes her breathing as shallow at times. She has since returned to her normal room. She denies any cold symptoms. Her main  complaint is right knee pain. History of bilateral knee replacements in the past. She is requesting medication for pain. Palliative consult 01/13, discussion on advanced care planning and goals of sudden injury/illness discussed.   No recent injuries or falls.   Recent blood pressures are as follows:  02/10- 114/74  02/09- 140/74  02/08- 103/60  Recent weights are as follows:  01/24- 186 lbs  12/30- 202.8 lbs She remains on magic cup and ensure.   Facility nurse does not report any concerns, vitals stable.           Past Medical History:  Diagnosis Date  . A-fib ([place])   . Dysrhythmia   . GERD (gastroesophageal reflux disease)   . Hemorrhoids   . Hyperlipidemia   . Hypertension   . Lung cancer ([place]) 2000   RLL  . Obesity   . Persistent atrial fibrillation ([place])   . Rheumatoid arthritis [facility])    Past Surgical History:  Procedure Laterality Date  . BREAST EXCISIONAL BIOPSY Left    x 2  . BREAST EXCISIONAL BIOPSY Right   . BRONCHIAL BIOPSY  02/18/2020   Procedure: BRONCHIAL BIOPSIES;  Surgeon: Collene Gobble, MD;  Location: [facility] ENDOSCOPY;  Service: Cardiopulmonary;;  . BRONCHIAL BRUSHINGS  02/18/2020   Procedure: BRONCHIAL BRUSHINGS;  Surgeon: Collene Gobble, MD;  Location: [place];  Service: Cardiopulmonary;;  . BRONCHIAL NEEDLE ASPIRATION BIOPSY  02/18/2020   Procedure: BRONCHIAL NEEDLE ASPIRATION BIOPSIES;  Surgeon: Collene Gobble, MD;  Location: [place];  Service: Cardiopulmonary;;  . BRONCHIAL WASHINGS  02/18/2020   Procedure: BRONCHIAL WASHINGS;  Surgeon: Collene Gobble, MD;  Location: [facility]  ENDOSCOPY;  Service: Cardiopulmonary;;  . CARDIOVERSION N/A 01/09/2018   Procedure: CARDIOVERSION;  Surgeon: Josue Hector, MD;  Location: [facility] ENDOSCOPY;  Service: Cardiovascular;  Laterality: N/A;  . CARDIOVERSION N/A 03/01/2018   Procedure: CARDIOVERSION;  Surgeon: Sueanne Margarita, MD;  Location: [facility] ENDOSCOPY;  Service: Cardiovascular;  Laterality: N/A;  .  ELECTROMAGNETIC NAVIGATION BROCHOSCOPY N/A 02/18/2020   Procedure: ELECTROMAGNETIC NAVIGATION BRONCHOSCOPY;  Surgeon: Collene Gobble, MD;  Location: [place];  Service: Cardiopulmonary;  Laterality: N/A;  . KNEE SURGERY    . LUNG LOBECTOMY Right 2000   RLL removed  . VIDEO BRONCHOSCOPY N/A 02/18/2020   Procedure: VIDEO BRONCHOSCOPY WITH FLUORO;  Surgeon: Collene Gobble, MD;  Location: [facility] ENDOSCOPY;  Service: Cardiopulmonary;  Laterality: N/A;  . VIDEO BRONCHOSCOPY WITH ENDOBRONCHIAL ULTRASOUND N/A 04/22/2020   Procedure: VIDEO BRONCHOSCOPY WITH ENDOBRONCHIAL ULTRASOUND;  Surgeon: [name], MD;  Location: [facility] OR;  Service: Thoracic;  Laterality: N/A;    Allergies  Allergen Reactions  . Morphine Anaphylaxis  . Morphine And Related Other (See Comments)    "vitals go down", pass out  . Sulfa Antibiotics Rash    Allergies as of 10/01/2020      Reactions   Morphine Anaphylaxis   Morphine And Related Other (See Comments)   "vitals go down", pass out   Sulfa Antibiotics Rash      Medication List       Accurate as of October 01, 2020  1:54 PM. If you have any questions, ask your nurse or doctor.        STOP taking these medications   NON FORMULARY Stopped by: Yvonna Alanis, NP     TAKE these medications   acetaminophen 325 MG tablet Commonly known as: TYLENOL Take 650 mg by mouth every 6 (six) hours as needed for mild pain.   bisacodyl 10 MG suppository Commonly known as: DULCOLAX Place 10 mg rectally See admin instructions. If not relieved by MOM give 70m Bisacodyl supp. Rectally for x1 dose in 24 hours prn constipation   cyanocobalamin 1000 MCG tablet Take 1 tablet (1,000 mcg total) by mouth daily.   diclofenac Sodium 1 % Gel Commonly known as: Voltaren Apply 4 g topically 4 (four) times daily. To bilateral knees and ankles   feeding supplement Liqd Take 237 mLs by mouth 3 (three) times daily. What changed: Another medication with the same name was  removed. Continue taking this medication, and follow the directions you see here. Changed by: AYvonna Alanis NP   gabapentin 100 MG capsule Commonly known as: NEURONTIN Take 100 mg by mouth at bedtime.   levocetirizine 5 MG tablet Commonly known as: XYZAL Take 5 mg by mouth daily.   metoprolol succinate 25 MG 24 hr tablet Commonly known as: TOPROL-XL Take 12.5 mg by mouth at bedtime.   Myrbetriq 25 MG Tb24 tablet Generic drug: mirabegron ER Take 25 mg by mouth daily.   NON FORMULARY Magic Cup L/D Meals.   OXYGEN Inhale 2 L into the lungs continuous.   prochlorperazine 10 MG tablet Commonly known as: COMPAZINE Take 10 mg by mouth every 6 (six) hours as needed for nausea or vomiting.   RA SALINE ENEMA RE Place 1 Container rectally See admin instructions. If not relieved by bisacodyl supp. Give disposable saline enema rectally x1 dose 24 hours prn constipation   simvastatin 20 MG tablet Commonly known as: ZOCOR Take 20 mg by mouth at bedtime.   timolol 0.5 % ophthalmic solution Commonly known  as: TIMOPTIC Place 1 drop into both eyes at bedtime.   UTI-Stat Liqd Take 30 mLs by mouth daily in the afternoon. For UTI prophylaxis   warfarin 2.5 MG tablet Commonly known as: COUMADIN Take as directed by the anticoagulation clinic. If you are unsure how to take this medication, talk to your nurse or doctor. Original instructions: Take 2.5 mg by mouth daily.       Review of Systems  Constitutional: Negative for activity change, appetite change and fever.  HENT: Negative for congestion, dental problem, hearing loss and trouble swallowing.   Eyes: Negative for visual disturbance.  Respiratory: Positive for shortness of breath. Negative for cough and wheezing.        Oxygen at times  Cardiovascular: Negative for chest pain and leg swelling.  Gastrointestinal: Negative for abdominal pain, constipation, diarrhea and nausea.  Genitourinary: Negative for dysuria, frequency and  hematuria.  Musculoskeletal: Positive for arthralgias and myalgias.       Bilateral knee pain  Neurological: Positive for weakness. Negative for dizziness and headaches.  Psychiatric/Behavioral: Positive for confusion. Negative for dysphoric mood. The patient is not nervous/anxious.     Immunization History  Administered Date(s) Administered  . Fluad Quad(high Dose 65+) 07/28/2020  . Influenza, High Dose Seasonal PF 06/27/2016, 05/23/2017, 06/08/2018  . Influenza-Unspecified 06/09/2020  . Moderna Sars-Covid-2 Vaccination 11/12/2019, 12/10/2019  . Pneumococcal Conjugate-13 01/04/2017  . Pneumococcal Polysaccharide-23 01/17/2018   Pertinent  Health Maintenance Due  Topic Date Due  . INFLUENZA VACCINE  Completed  . DEXA SCAN  Completed  . PNA vac Low Risk Adult  Completed   No flowsheet data found. Functional Status Survey:    Vitals:   10/01/20 1149  BP: 114/74  Pulse: 78  Resp: 20  Temp: (!) 97 F (36.1 C)  Weight: 186 lb (84.4 kg)  Height: 5' 1"  (1.549 m)   Body mass index is 35.14 kg/m. Physical Exam Vitals reviewed.  Constitutional:      General: She is not in acute distress.    Appearance: She is obese.  HENT:     Head: Normocephalic.     Right Ear: There is no impacted cerumen.     Left Ear: There is no impacted cerumen.     Nose: Nose normal.     Mouth/Throat:     Mouth: Mucous membranes are moist.  Eyes:     General:        Right eye: No discharge.        Left eye: No discharge.  Cardiovascular:     Rate and Rhythm: Normal rate. Rhythm irregular.     Pulses: Normal pulses.     Heart sounds: Normal heart sounds. No murmur heard.   Pulmonary:     Effort: Pulmonary effort is normal. No respiratory distress.     Breath sounds: Normal breath sounds. No wheezing.  Abdominal:     General: Bowel sounds are normal. There is no distension.     Palpations: Abdomen is soft.     Tenderness: There is no abdominal tenderness.  Musculoskeletal:     Cervical  back: Normal range of motion.     Right lower leg: No edema.     Left lower leg: No edema.     Comments: No swelling over knees, no pain with palpation.   Lymphadenopathy:     Cervical: No cervical adenopathy.  Skin:    General: Skin is warm and dry.     Capillary Refill: Capillary refill takes less than  2 seconds.  Neurological:     General: No focal deficit present.     Mental Status: She is alert and oriented to person, place, and time.     Motor: Weakness present.     Comments: Wheelchair/lift  Psychiatric:        Mood and Affect: Mood normal.        Behavior: Behavior normal.        Cognition and Memory: Memory is impaired.     Labs reviewed: Recent Labs    02/15/20 0121 02/16/20 0431 07/26/20 0724 07/27/20 0848 07/28/20 0653 08/03/20 1329 08/06/20 0000 08/17/20 0000 08/25/20 0000 08/26/20 0000  NA 138   < > 138 139 140 140   < > 140 138 140  K 3.5   < > 3.2* 3.1* 3.1* 2.8*   < > 2.9* 3.7 3.5  CL 102   < > 105 105 105 102   < > 103 102 102  CO2 23   < > 21* 23 24 27    < > 26* 25* 27*  GLUCOSE 99   < > 87 81 79 95  --   --   --   --   BUN 12   < > 11 11 10  5*   < > 8 8 7   CREATININE 0.82   < > 0.39* 0.40* 0.42* 0.56   < > 0.3* 0.3* 0.4*  CALCIUM 9.0   < > 8.3* 8.5* 8.8* 9.1   < > 8.3* 8.0* 8.5*  MG 1.9   < > 1.5* 1.4* 1.6*  --   --   --   --   --   PHOS 3.0  --  2.4*  --   --   --   --   --   --   --    < > = values in this interval not displayed.   Recent Labs    07/25/20 0124 07/26/20 0724 07/27/20 0848 08/03/20 1329 08/26/20 0000  AST 13*  --  14* 19 18  ALT 13  --  14 19 9   ALKPHOS 68  --  74 85 101  BILITOT 1.5*  --  0.9 0.7  --   PROT 5.6*  --  5.7* 6.3*  --   ALBUMIN 2.7*   < > 2.7* 2.5* 2.5*   < > = values in this interval not displayed.   Recent Labs    07/27/20 0848 07/28/20 0653 08/03/20 1329 08/10/20 0000 08/17/20 0000 08/25/20 0000 09/01/20 0000  WBC 5.9 8.9 11.2*   < > 6.4 6.8 4.5  NEUTROABS 3.1 5.3 7.2  --   --   --   --   HGB  9.2* 9.4* 10.4*   < > 9.5* 9.5* 10.8*  HCT 29.5* 30.6* 32.7*   < > 28* 28* 32*  MCV 92.2 92.4 91.6  --   --   --   --   PLT 46* 38* 175   < > 113* 177 191   < > = values in this interval not displayed.   Lab Results  Component Value Date   TSH 2.458 02/15/2020   Lab Results  Component Value Date   HGBA1C 5.7 (H) 02/15/2020   No results found for: CHOL, HDL, LDLCALC, LDLDIRECT, TRIG, CHOLHDL  Significant Diagnostic Results in last 30 days:  No results found.  Assessment/Plan 1. Persistent atrial fibrillation (HCC) - rate controlled with metoprolol - cont coumadin for clot prevention - INR to be checked 02/14  2. Generalized weakness - ongoing, suspect recent covid contributed to increased weakness - lost about 16 pounds from Dec to Jan - cont supplemental nutrition  3. Essential hypertension - bp at goal < 150/90 - cont metoprolol  4. Mixed hyperlipidemia - stable with statin  5. Small cell lung cancer, right upper lobe [facility]) - followed by Dr. Earlie Server- next visit March 2022 - last chemo Sep 2021 - she c/o some shallow breathing at times, not on oxygen today  6. Dementia without behavioral disturbance, unspecified dementia type ([place]) - appears more confused than other times we have met - no recent behavioral outbursts  7. Weight loss - appetite poor, suspect due to past chemo treatments and recent covid - cont ensure and magic cups  8. Chronic pain of both knees - left knee more painful than right, history of knee arthroplasty - start tylenol 1000 mg po bid for pain     Family/ staff Communication: Plan discussed with patient and facility nurse  Labs/tests ordered:  none

## 2020-10-12 ENCOUNTER — Non-Acute Institutional Stay (SKILLED_NURSING_FACILITY): Payer: Medicare HMO | Admitting: Orthopedic Surgery

## 2020-10-12 ENCOUNTER — Encounter: Payer: Self-pay | Admitting: Orthopedic Surgery

## 2020-10-12 DIAGNOSIS — Z7901 Long term (current) use of anticoagulants: Secondary | ICD-10-CM | POA: Diagnosis not present

## 2020-10-12 DIAGNOSIS — I4819 Other persistent atrial fibrillation: Secondary | ICD-10-CM

## 2020-10-12 NOTE — Progress Notes (Signed)
Location:    [place] Room Number: 205/D Place of Service:  SNF [phone]) Provider: Windell Moulding NP   [name], MD  Patient Care Team: [name], MD as PCP - General (Family Medicine) Lorretta Harp, MD as PCP - Cardiology (Cardiology) Gavin Pound, MD as Consulting Physician (Rheumatology) Valrie Hart, RN as Oncology Nurse Navigator  Extended Emergency Contact Information Primary Emergency Contact: [name] Mobile Phone: [phone] Relation: Daughter Secondary Emergency Contact: [name] Mobile Phone: [phone] Relation: Daughter Preferred language: Cleophus Molt Interpreter needed? No  Code Status:  Full Code Goals of care: Advanced Directive information Advanced Directives 10/12/2020  Does Patient Have a Medical Advance Directive? No  Type of Advance Directive -  Does patient want to make changes to medical advance directive? No - Patient declined  Copy of [place] in Chart? -  Would patient like information on creating a medical advance directive? -     Chief Complaint  Patient presents with  . Anticoagulation    Low INR    HPI:  Pt is a 80 y.o. female seen today for an acute visit for low INR.    She is a resident of [place], seen today at bedside. PMH includes: hypertension, a-fib, lung cancer, rheumatoid arthritis, hyperlipidemia, and generalized weakness.   Facility nurse reports today's INR 1.7. She currently is on coumadin 2.5 mg po daily. Goal INR 2-3. No signs of bleeding reported. Diet not consistent with vitamin K food due to her poor appetite at times.   Today, she is alert to self and familiar faces. Follows commands and can express needs. She denies any chest pain or shortness of breath. Reports sometimes her breathing is shallow due to her lung cancer. She has stated this in past visits.   Past Medical History:  Diagnosis Date  . A-fib ([place])   .  Dysrhythmia   . GERD (gastroesophageal reflux disease)   . Hemorrhoids   . Hyperlipidemia   . Hypertension   . Lung cancer ([place]) 2000   RLL  . Obesity   . Persistent atrial fibrillation ([place])   . Rheumatoid arthritis [facility])    Past Surgical History:  Procedure Laterality Date  . BREAST EXCISIONAL BIOPSY Left    x 2  . BREAST EXCISIONAL BIOPSY Right   . BRONCHIAL BIOPSY  02/18/2020   Procedure: BRONCHIAL BIOPSIES;  Surgeon: Collene Gobble, MD;  Location: [facility] ENDOSCOPY;  Service: Cardiopulmonary;;  . BRONCHIAL BRUSHINGS  02/18/2020   Procedure: BRONCHIAL BRUSHINGS;  Surgeon: Collene Gobble, MD;  Location: [place];  Service: Cardiopulmonary;;  . BRONCHIAL NEEDLE ASPIRATION BIOPSY  02/18/2020   Procedure: BRONCHIAL NEEDLE ASPIRATION BIOPSIES;  Surgeon: Collene Gobble, MD;  Location: [place];  Service: Cardiopulmonary;;  . BRONCHIAL WASHINGS  02/18/2020   Procedure: BRONCHIAL WASHINGS;  Surgeon: Collene Gobble, MD;  Location: [place];  Service: Cardiopulmonary;;  . CARDIOVERSION N/A 01/09/2018   Procedure: CARDIOVERSION;  Surgeon: Josue Hector, MD;  Location: [facility] ENDOSCOPY;  Service: Cardiovascular;  Laterality: N/A;  . CARDIOVERSION N/A 03/01/2018   Procedure: CARDIOVERSION;  Surgeon: Sueanne Margarita, MD;  Location: [facility] ENDOSCOPY;  Service: Cardiovascular;  Laterality: N/A;  . ELECTROMAGNETIC NAVIGATION BROCHOSCOPY N/A 02/18/2020   Procedure: ELECTROMAGNETIC NAVIGATION BRONCHOSCOPY;  Surgeon: Collene Gobble, MD;  Location: [place];  Service: Cardiopulmonary;  Laterality: N/A;  . KNEE SURGERY    . LUNG LOBECTOMY Right 2000   RLL  removed  . VIDEO BRONCHOSCOPY N/A 02/18/2020   Procedure: VIDEO BRONCHOSCOPY WITH FLUORO;  Surgeon: Collene Gobble, MD;  Location: [facility] ENDOSCOPY;  Service: Cardiopulmonary;  Laterality: N/A;  . VIDEO BRONCHOSCOPY WITH ENDOBRONCHIAL ULTRASOUND N/A 04/22/2020   Procedure: VIDEO BRONCHOSCOPY WITH ENDOBRONCHIAL ULTRASOUND;  Surgeon: [name], MD;  Location: [facility] OR;  Service: Thoracic;  Laterality: N/A;    Allergies  Allergen Reactions  . Morphine Anaphylaxis  . Morphine And Related Other (See Comments)    "vitals go down", pass out  . Sulfa Antibiotics Rash    Allergies as of 10/12/2020      Reactions   Morphine Anaphylaxis   Morphine And Related Other (See Comments)   "vitals go down", pass out   Sulfa Antibiotics Rash      Medication List       Accurate as of October 12, 2020 11:36 AM. If you have any questions, ask your nurse or doctor.        acetaminophen 325 MG tablet Commonly known as: TYLENOL Take 650 mg by mouth every 6 (six) hours as needed for mild pain.   acetaminophen 500 MG tablet Commonly known as: TYLENOL Take 500 mg by mouth 2 (two) times daily as needed. For left knee pain   bisacodyl 10 MG suppository Commonly known as: DULCOLAX Place 10 mg rectally See admin instructions. If not relieved by MOM give 10mg  Bisacodyl supp. Rectally for x1 dose in 24 hours prn constipation   cyanocobalamin 1000 MCG tablet Take 1 tablet (1,000 mcg total) by mouth daily.   diclofenac Sodium 1 % Gel Commonly known as: Voltaren Apply 4 g topically 4 (four) times daily. To bilateral knees and ankles   feeding supplement Liqd Take 237 mLs by mouth 3 (three) times daily.   gabapentin 100 MG capsule Commonly known as: NEURONTIN Take 100 mg by mouth at bedtime.   levocetirizine 5 MG tablet Commonly known as: XYZAL Take 5 mg by mouth daily.   metoprolol succinate 25 MG 24 hr tablet Commonly known as: TOPROL-XL Take 12.5 mg by mouth at bedtime.   Myrbetriq 25 MG Tb24 tablet Generic drug: mirabegron ER Take 25 mg by mouth daily.   NON FORMULARY Magic Cup L/D Meals.   OXYGEN Inhale 2 L into the lungs continuous.   prochlorperazine 10 MG tablet Commonly known as: COMPAZINE Take 10 mg by mouth every 6 (six) hours as needed for nausea or vomiting.   RA SALINE ENEMA RE Place 1 Container  rectally See admin instructions. If not relieved by bisacodyl supp. Give disposable saline enema rectally x1 dose 24 hours prn constipation   simvastatin 20 MG tablet Commonly known as: ZOCOR Take 20 mg by mouth at bedtime.   timolol 0.5 % ophthalmic solution Commonly known as: TIMOPTIC Place 1 drop into both eyes at bedtime.   UTI-Stat Liqd Take 30 mLs by mouth daily in the afternoon. For UTI prophylaxis   warfarin 2.5 MG tablet Commonly known as: COUMADIN Take as directed by the anticoagulation clinic. If you are unsure how to take this medication, talk to your nurse or doctor. Original instructions: Take 2.5 mg by mouth daily.       Review of Systems  Constitutional: Negative for activity change, appetite change and fever.  Respiratory: Negative for cough, shortness of breath and wheezing.   Cardiovascular: Negative for chest pain and leg swelling.  Psychiatric/Behavioral: Positive for confusion. Negative for dysphoric mood. The patient is not nervous/anxious.     Immunization  History  Administered Date(s) Administered  . Fluad Quad(high Dose 65+) 07/28/2020  . Influenza, High Dose Seasonal PF 06/27/2016, 05/23/2017, 06/08/2018  . Influenza-Unspecified 06/09/2020  . Moderna Sars-Covid-2 Vaccination 11/12/2019, 12/10/2019  . Pneumococcal Conjugate-13 01/04/2017  . Pneumococcal Polysaccharide-23 01/17/2018   Pertinent  Health Maintenance Due  Topic Date Due  . INFLUENZA VACCINE  Completed  . DEXA SCAN  Completed  . PNA vac Low Risk Adult  Completed   No flowsheet data found. Functional Status Survey:    Vitals:   10/12/20 1136  BP: 113/63  Pulse: 63  Resp: 18  Temp: (!) 97.1 F (36.2 C)  Weight: 186 lb (84.4 kg)  Height: 5\' 1"  (1.549 m)   Body mass index is 35.14 kg/m. Physical Exam Constitutional:      General: She is not in acute distress.    Appearance: She is obese.  Cardiovascular:     Rate and Rhythm: Normal rate. Rhythm irregular.     Pulses:  Normal pulses.     Heart sounds: No murmur heard.   Pulmonary:     Effort: Pulmonary effort is normal. No respiratory distress.     Breath sounds: Normal breath sounds. No wheezing.     Comments: Room air Neurological:     General: No focal deficit present.     Mental Status: She is alert. Mental status is at baseline.     Motor: Weakness present.     Gait: Gait abnormal.  Psychiatric:        Mood and Affect: Mood normal.        Behavior: Behavior normal.        Cognition and Memory: Memory is impaired.     Labs reviewed: Recent Labs    02/15/20 0121 02/16/20 0431 07/26/20 0724 07/27/20 0848 07/28/20 0653 08/03/20 1329 08/06/20 0000 08/17/20 0000 08/25/20 0000 08/26/20 0000  NA 138   < > 138 139 140 140   < > 140 138 140  K 3.5   < > 3.2* 3.1* 3.1* 2.8*   < > 2.9* 3.7 3.5  CL 102   < > 105 105 105 102   < > 103 102 102  CO2 23   < > 21* 23 24 27    < > 26* 25* 27*  GLUCOSE 99   < > 87 81 79 95  --   --   --   --   BUN 12   < > 11 11 10  5*   < > 8 8 7   CREATININE 0.82   < > 0.39* 0.40* 0.42* 0.56   < > 0.3* 0.3* 0.4*  CALCIUM 9.0   < > 8.3* 8.5* 8.8* 9.1   < > 8.3* 8.0* 8.5*  MG 1.9   < > 1.5* 1.4* 1.6*  --   --   --   --   --   PHOS 3.0  --  2.4*  --   --   --   --   --   --   --    < > = values in this interval not displayed.   Recent Labs    07/25/20 0124 07/26/20 0724 07/27/20 0848 08/03/20 1329 08/26/20 0000  AST 13*  --  14* 19 18  ALT 13  --  14 19 9   ALKPHOS 68  --  74 85 101  BILITOT 1.5*  --  0.9 0.7  --   PROT 5.6*  --  5.7* 6.3*  --   ALBUMIN  2.7*   < > 2.7* 2.5* 2.5*   < > = values in this interval not displayed.   Recent Labs    07/27/20 0848 07/28/20 0653 08/03/20 1329 08/10/20 0000 08/17/20 0000 08/25/20 0000 09/01/20 0000  WBC 5.9 8.9 11.2*   < > 6.4 6.8 4.5  NEUTROABS 3.1 5.3 7.2  --   --   --   --   HGB 9.2* 9.4* 10.4*   < > 9.5* 9.5* 10.8*  HCT 29.5* 30.6* 32.7*   < > 28* 28* 32*  MCV 92.2 92.4 91.6  --   --   --   --   PLT  46* 38* 175   < > 113* 177 191   < > = values in this interval not displayed.   Lab Results  Component Value Date   TSH 2.458 02/15/2020   Lab Results  Component Value Date   HGBA1C 5.7 (H) 02/15/2020   No results found for: CHOL, HDL, LDLCALC, LDLDIRECT, TRIG, CHOLHDL  Significant Diagnostic Results in last 30 days:  No results found.  Assessment/Plan 1. Persistent atrial fibrillation (HCC) - rate controlled - cont metoprolol  2. Long term (current) use of anticoagulants - INR 1.7, goal 2-3 Increase coumadin to 3 mg po daily - recheck INR in 1 week   Family/ staff Communication: Plan discussed with patient and facility nurse  Labs/tests ordered:  INR 1 week

## 2020-10-19 ENCOUNTER — Non-Acute Institutional Stay (SKILLED_NURSING_FACILITY): Payer: Medicare HMO | Admitting: Orthopedic Surgery

## 2020-10-19 ENCOUNTER — Encounter: Payer: Self-pay | Admitting: Orthopedic Surgery

## 2020-10-19 DIAGNOSIS — J029 Acute pharyngitis, unspecified: Secondary | ICD-10-CM | POA: Diagnosis not present

## 2020-10-19 DIAGNOSIS — K120 Recurrent oral aphthae: Secondary | ICD-10-CM | POA: Diagnosis not present

## 2020-10-19 DIAGNOSIS — H9202 Otalgia, left ear: Secondary | ICD-10-CM

## 2020-10-19 NOTE — Progress Notes (Signed)
Location:  [place] Room Number: 205/D Place of Service:  SNF [phone]) Provider:  Keishon Chavarin AGNP-C  [place], Jannifer Rodney, MD  Patient Care Team: [name], MD as PCP - General (Family Medicine) Lorretta Harp, MD as PCP - Cardiology (Cardiology) Gavin Pound, MD as Consulting Physician (Rheumatology) Valrie Hart, RN as Oncology Nurse Navigator  Extended Emergency Contact Information Primary Emergency Contact: [name] Mobile Phone: [phone] Relation: Daughter Secondary Emergency Contact: [name] Mobile Phone: [phone] Relation: Daughter Preferred language: English Interpreter needed? No  Code Status: Full code Goals of care: Advanced Directive information Advanced Directives 10/12/2020  Does Patient Have a Medical Advance Directive? No  Type of Advance Directive -  Does patient want to make changes to medical advance directive? No - Patient declined  Copy of [place] in Chart? -  Would patient like information on creating a medical advance directive? -     Chief Complaint  Patient presents with  . Acute Visit    Left ear pain    HPI:  Pt is a 80 y.o. female seen today for acute visit for left ear pain.  She is a resident of [place], seen today at bedside. PMH includes: hypertension, a-fib, lung cancer, rheumatoid arthritis, hyperlipidemia, and generalized weakness.   Facility nurse reports patient complains of left ear pain. Pain started today, rated 4/10. She is also having sore throat. Past covid infection in December.   Today, she is resting in bed. Upset her left ear hurts with movement. Has not tired any interventions for pain. Also reports sore throat and canker sore on right upper lip. Sore throat began this morning. Relieved with warm liquids. Canker sore pain rated 5/10. She has had them before and used cank-aid gel. Denies chest pain, shortness of breath,  fever, nasal congestion, and trouble hearing.    Past Medical History:  Diagnosis Date  . A-fib ([place])   . Dysrhythmia   . GERD (gastroesophageal reflux disease)   . Hemorrhoids   . Hyperlipidemia   . Hypertension   . Lung cancer ([place]) 2000   RLL  . Obesity   . Persistent atrial fibrillation ([place])   . Rheumatoid arthritis [facility])    Past Surgical History:  Procedure Laterality Date  . BREAST EXCISIONAL BIOPSY Left    x 2  . BREAST EXCISIONAL BIOPSY Right   . BRONCHIAL BIOPSY  02/18/2020   Procedure: BRONCHIAL BIOPSIES;  Surgeon: Collene Gobble, MD;  Location: [facility] ENDOSCOPY;  Service: Cardiopulmonary;;  . BRONCHIAL BRUSHINGS  02/18/2020   Procedure: BRONCHIAL BRUSHINGS;  Surgeon: Collene Gobble, MD;  Location: [place];  Service: Cardiopulmonary;;  . BRONCHIAL NEEDLE ASPIRATION BIOPSY  02/18/2020   Procedure: BRONCHIAL NEEDLE ASPIRATION BIOPSIES;  Surgeon: Collene Gobble, MD;  Location: [place];  Service: Cardiopulmonary;;  . BRONCHIAL WASHINGS  02/18/2020   Procedure: BRONCHIAL WASHINGS;  Surgeon: Collene Gobble, MD;  Location: [place];  Service: Cardiopulmonary;;  . CARDIOVERSION N/A 01/09/2018   Procedure: CARDIOVERSION;  Surgeon: Josue Hector, MD;  Location: [facility] ENDOSCOPY;  Service: Cardiovascular;  Laterality: N/A;  . CARDIOVERSION N/A 03/01/2018   Procedure: CARDIOVERSION;  Surgeon: Sueanne Margarita, MD;  Location[facility] ENDOSCOPY;  Service: Cardiovascular;  Laterality: N/A;  . ELECTROMAGNETIC NAVIGATION BROCHOSCOPY N/A 02/18/2020   Procedure: ELECTROMAGNETIC NAVIGATION BRONCHOSCOPY;  Surgeon: Collene Gobble, MD;  Location: [place];  Service: Cardiopulmonary;  Laterality: N/A;  . KNEE SURGERY    .  LUNG LOBECTOMY Right 2000   RLL removed  . VIDEO BRONCHOSCOPY N/A 02/18/2020   Procedure: VIDEO BRONCHOSCOPY WITH FLUORO;  Surgeon: Collene Gobble, MD;  Location: [facility] ENDOSCOPY;  Service: Cardiopulmonary;  Laterality: N/A;  . VIDEO BRONCHOSCOPY WITH ENDOBRONCHIAL  ULTRASOUND N/A 04/22/2020   Procedure: VIDEO BRONCHOSCOPY WITH ENDOBRONCHIAL ULTRASOUND;  Surgeon: [name], MD;  Location: [facility] OR;  Service: Thoracic;  Laterality: N/A;    Allergies  Allergen Reactions  . Morphine Anaphylaxis  . Morphine And Related Other (See Comments)    "vitals go down", pass out  . Sulfa Antibiotics Rash    Outpatient Encounter Medications as of 10/19/2020  Medication Sig  . acetaminophen (TYLENOL) 325 MG tablet Take 650 mg by mouth every 6 (six) hours as needed for mild pain.  Marland Kitchen acetaminophen (TYLENOL) 500 MG tablet Take 500 mg by mouth 2 (two) times daily as needed. For left knee pain  . bisacodyl (DULCOLAX) 10 MG suppository Place 10 mg rectally See admin instructions. If not relieved by MOM give 10mg  Bisacodyl supp. Rectally for x1 dose in 24 hours prn constipation   . Cranberry-Vitamin C-Inulin (UTI-STAT) LIQD Take 30 mLs by mouth daily in the afternoon. For UTI prophylaxis  . diclofenac Sodium (VOLTAREN) 1 % GEL Apply 4 g topically 4 (four) times daily. To bilateral knees and ankles  . feeding supplement (ENSURE ENLIVE / ENSURE PLUS) LIQD Take 237 mLs by mouth 3 (three) times daily.  Marland Kitchen gabapentin (NEURONTIN) 100 MG capsule Take 100 mg by mouth at bedtime.  Marland Kitchen levocetirizine (XYZAL) 5 MG tablet Take 5 mg by mouth daily.   . metoprolol succinate (TOPROL-XL) 25 MG 24 hr tablet Take 12.5 mg by mouth at bedtime.   Marland Kitchen MYRBETRIQ 25 MG TB24 tablet Take 25 mg by mouth daily.  . NON FORMULARY Magic Cup L/D Meals.  . OXYGEN Inhale 2 L into the lungs continuous.  . prochlorperazine (COMPAZINE) 10 MG tablet Take 10 mg by mouth every 6 (six) hours as needed for nausea or vomiting.  . simvastatin (ZOCOR) 20 MG tablet Take 20 mg by mouth at bedtime.  . Sodium Phosphates (RA SALINE ENEMA RE) Place 1 Container rectally See admin instructions. If not relieved by bisacodyl supp. Give disposable saline enema rectally x1 dose 24 hours prn constipation   . timolol (TIMOPTIC)  0.5 % ophthalmic solution Place 1 drop into both eyes at bedtime.   . vitamin B-12 1000 MCG tablet Take 1 tablet (1,000 mcg total) by mouth daily.  Marland Kitchen warfarin (COUMADIN) 2.5 MG tablet Take 2.5 mg by mouth daily.   No facility-administered encounter medications on file as of 10/19/2020.    Review of Systems  Constitutional: Positive for fatigue. Negative for activity change, appetite change and fever.  HENT: Positive for ear pain and sore throat. Negative for congestion, hearing loss and trouble swallowing.        Canker sore  Respiratory: Negative for cough, shortness of breath and wheezing.        2 liters oxygen  Cardiovascular: Negative for chest pain and leg swelling.  Psychiatric/Behavioral: Positive for confusion. Negative for dysphoric mood. The patient is not nervous/anxious.     Immunization History  Administered Date(s) Administered  . Fluad Quad(high Dose 65+) 07/28/2020  . Influenza, High Dose Seasonal PF 06/27/2016, 05/23/2017, 06/08/2018  . Influenza-Unspecified 06/09/2020  . Moderna Sars-Covid-2 Vaccination 11/12/2019, 12/10/2019  . Pneumococcal Conjugate-13 01/04/2017  . Pneumococcal Polysaccharide-23 01/17/2018   Pertinent  Health Maintenance Due  Topic Date Due  .  INFLUENZA VACCINE  Completed  . DEXA SCAN  Completed  . PNA vac Low Risk Adult  Completed   No flowsheet data found. Functional Status Survey:    Vitals:   10/19/20 1626  BP: 136/76  Pulse: 76  Resp: 18  Temp: (!) 97.4 F (36.3 C)  Weight: 186 lb (84.4 kg)   Body mass index is 35.14 kg/m. Physical Exam Vitals reviewed.  Constitutional:      General: She is not in acute distress.    Appearance: She is obese.  HENT:     Head: Normocephalic.     Right Ear: There is impacted cerumen.     Left Ear: There is impacted cerumen.     Ears:     Comments: Cannot visualize TM due to cerumen buildup    Nose: Nose normal.     Mouth/Throat:     Pharynx: Posterior oropharyngeal erythema present.    Eyes:     General:        Right eye: No discharge.        Left eye: No discharge.  Neck:   Cardiovascular:     Rate and Rhythm: Normal rate. Rhythm irregular.     Pulses: Normal pulses.     Heart sounds: Normal heart sounds. No murmur heard.   Pulmonary:     Effort: Pulmonary effort is normal. No respiratory distress.     Breath sounds: Normal breath sounds. No wheezing.  Musculoskeletal:     Cervical back: Normal range of motion.  Lymphadenopathy:     Cervical: Cervical adenopathy present.  Skin:    General: Skin is warm and dry.     Capillary Refill: Capillary refill takes less than 2 seconds.  Neurological:     General: No focal deficit present.     Mental Status: She is alert. Mental status is at baseline.     Cranial Nerves: Cranial nerve deficit present.  Psychiatric:        Mood and Affect: Mood normal.        Behavior: Behavior normal.     Labs reviewed: Recent Labs    02/15/20 0121 02/16/20 0431 07/26/20 0724 07/27/20 0848 07/28/20 0653 08/03/20 1329 08/06/20 0000 08/17/20 0000 08/25/20 0000 08/26/20 0000  NA 138   < > 138 139 140 140   < > 140 138 140  K 3.5   < > 3.2* 3.1* 3.1* 2.8*   < > 2.9* 3.7 3.5  CL 102   < > 105 105 105 102   < > 103 102 102  CO2 23   < > 21* 23 24 27    < > 26* 25* 27*  GLUCOSE 99   < > 87 81 79 95  --   --   --   --   BUN 12   < > 11 11 10  5*   < > 8 8 7   CREATININE 0.82   < > 0.39* 0.40* 0.42* 0.56   < > 0.3* 0.3* 0.4*  CALCIUM 9.0   < > 8.3* 8.5* 8.8* 9.1   < > 8.3* 8.0* 8.5*  MG 1.9   < > 1.5* 1.4* 1.6*  --   --   --   --   --   PHOS 3.0  --  2.4*  --   --   --   --   --   --   --    < > = values in this interval not displayed.   Recent  Labs    07/25/20 0124 07/26/20 0724 07/27/20 0848 08/03/20 1329 08/26/20 0000  AST 13*  --  14* 19 18  ALT 13  --  14 19 9   ALKPHOS 68  --  74 85 101  BILITOT 1.5*  --  0.9 0.7  --   PROT 5.6*  --  5.7* 6.3*  --   ALBUMIN 2.7*   < > 2.7* 2.5* 2.5*   < > = values in this  interval not displayed.   Recent Labs    07/27/20 0848 07/28/20 0653 08/03/20 1329 08/10/20 0000 08/17/20 0000 08/25/20 0000 09/01/20 0000  WBC 5.9 8.9 11.2*   < > 6.4 6.8 4.5  NEUTROABS 3.1 5.3 7.2  --   --   --   --   HGB 9.2* 9.4* 10.4*   < > 9.5* 9.5* 10.8*  HCT 29.5* 30.6* 32.7*   < > 28* 28* 32*  MCV 92.2 92.4 91.6  --   --   --   --   PLT 46* 38* 175   < > 113* 177 191   < > = values in this interval not displayed.   Lab Results  Component Value Date   TSH 2.458 02/15/2020   Lab Results  Component Value Date   HGBA1C 5.7 (H) 02/15/2020   No results found for: CHOL, HDL, LDLCALC, LDLDIRECT, TRIG, CHOLHDL  Significant Diagnostic Results in last 30 days:  No results found.  Assessment/Plan 1. Sore throat - pharynx with erythema, she reports mild pain when swallowing - recommend warm fluids  2. Left ear pain - cannot visualize TM due to wax buildup - lymph node under left ear swollen - will start Azithromycin 500 mg x 1 day then 250 mg po x 4 days - start probiotic- 1 daily x 7 days - debrox- apply 5 drops to left and right ear x 7 days  3. Canker sore - small white lesion on right upper lip - orajel- apply to mouth lesion   Family/ staff Communication: Plan discussed with patient and facility nurse  Labs/tests ordered: none

## 2020-10-26 ENCOUNTER — Non-Acute Institutional Stay (SKILLED_NURSING_FACILITY): Payer: Medicare HMO | Admitting: Orthopedic Surgery

## 2020-10-26 ENCOUNTER — Encounter: Payer: Self-pay | Admitting: Orthopedic Surgery

## 2020-10-26 DIAGNOSIS — I4819 Other persistent atrial fibrillation: Secondary | ICD-10-CM | POA: Diagnosis not present

## 2020-10-26 DIAGNOSIS — Z7901 Long term (current) use of anticoagulants: Secondary | ICD-10-CM

## 2020-10-26 NOTE — Progress Notes (Signed)
Location:    [place] Room Number: 205/D Place of Service:  SNF [phone]) Provider:  Windell Moulding NP  [name], MD  Patient Care Team: [name], MD as PCP - General (Family Medicine) Lorretta Harp, MD as PCP - Cardiology (Cardiology) Gavin Pound, MD as Consulting Physician (Rheumatology) Valrie Hart, RN as Oncology Nurse Navigator  Extended Emergency Contact Information Primary Emergency Contact: [name] Mobile Phone: [phone] Relation: Daughter Secondary Emergency Contact: [name] Mobile Phone: [phone] Relation: Daughter Preferred language: Cleophus Molt Interpreter needed? No  Code Status: Full Code  Goals of care: Advanced Directive information Advanced Directives 10/26/2020  Does Patient Have a Medical Advance Directive? No  Type of Advance Directive -  Does patient want to make changes to medical advance directive? No - Patient declined  Copy of [place] in Chart? -  Would patient like information on creating a medical advance directive? -     Chief Complaint  Patient presents with  . Anticoagulation    Elevated INR    HPI:  Pt is a 80 y.o. female seen today for an acute visit for increased INR.   She is a resident of [place], seen today at bedside. PMH includes: hypertension, a-fib, lung cancer, rheumatoid arthritis, hyperlipidemia, and generalized weakness.   Facility nurse reports today's INR 3.5. She is currently taking coumadin 3 mg daily and 4 mg on Saturday. No signs of bleeding reported.  Today, she is alert to self and familiar faces. She denies chest pain or sob. Still reporting shallow breathing at times due to lung cancer. Does not require oxygen at this time.       Past Medical History:  Diagnosis Date  . A-fib ([place])   . Dysrhythmia   . GERD (gastroesophageal reflux disease)   . Hemorrhoids   . Hyperlipidemia   . Hypertension   .  Lung cancer ([place]) 2000   RLL  . Obesity   . Persistent atrial fibrillation ([place])   . Rheumatoid arthritis [facility])    Past Surgical History:  Procedure Laterality Date  . BREAST EXCISIONAL BIOPSY Left    x 2  . BREAST EXCISIONAL BIOPSY Right   . BRONCHIAL BIOPSY  02/18/2020   Procedure: BRONCHIAL BIOPSIES;  Surgeon: Collene Gobble, MD;  Location: [facility] ENDOSCOPY;  Service: Cardiopulmonary;;  . BRONCHIAL BRUSHINGS  02/18/2020   Procedure: BRONCHIAL BRUSHINGS;  Surgeon: Collene Gobble, MD;  Location: [place];  Service: Cardiopulmonary;;  . BRONCHIAL NEEDLE ASPIRATION BIOPSY  02/18/2020   Procedure: BRONCHIAL NEEDLE ASPIRATION BIOPSIES;  Surgeon: Collene Gobble, MD;  Location: [place];  Service: Cardiopulmonary;;  . BRONCHIAL WASHINGS  02/18/2020   Procedure: BRONCHIAL WASHINGS;  Surgeon: Collene Gobble, MD;  Location: [place];  Service: Cardiopulmonary;;  . CARDIOVERSION N/A 01/09/2018   Procedure: CARDIOVERSION;  Surgeon: Josue Hector, MD;  Location: [facility] ENDOSCOPY;  Service: Cardiovascular;  Laterality: N/A;  . CARDIOVERSION N/A 03/01/2018   Procedure: CARDIOVERSION;  Surgeon: Sueanne Margarita, MD;  Location: [facility] ENDOSCOPY;  Service: Cardiovascular;  Laterality: N/A;  . ELECTROMAGNETIC NAVIGATION BROCHOSCOPY N/A 02/18/2020   Procedure: ELECTROMAGNETIC NAVIGATION BRONCHOSCOPY;  Surgeon: Collene Gobble, MD;  Location: [place];  Service: Cardiopulmonary;  Laterality: N/A;  . KNEE SURGERY    . LUNG LOBECTOMY Right 2000   RLL removed  . VIDEO BRONCHOSCOPY N/A 02/18/2020   Procedure: VIDEO BRONCHOSCOPY WITH FLUORO;  Surgeon: Collene Gobble, MD;  Location: [facility] ENDOSCOPY;  Service: Cardiopulmonary;  Laterality: N/A;  . VIDEO BRONCHOSCOPY WITH ENDOBRONCHIAL ULTRASOUND N/A 04/22/2020   Procedure: VIDEO BRONCHOSCOPY WITH ENDOBRONCHIAL ULTRASOUND;  Surgeon: [name], MD;  Location: [facility] OR;  Service: Thoracic;  Laterality: N/A;    Allergies  Allergen Reactions  . Morphine  Anaphylaxis  . Morphine And Related Other (See Comments)    "vitals go down", pass out  . Sulfa Antibiotics Rash    Allergies as of 10/26/2020      Reactions   Morphine Anaphylaxis   Morphine And Related Other (See Comments)   "vitals go down", pass out   Sulfa Antibiotics Rash      Medication List       Accurate as of October 26, 2020 11:58 AM. If you have any questions, ask your nurse or doctor.        STOP taking these medications   prochlorperazine 10 MG tablet Commonly known as: COMPAZINE Stopped by: Yvonna Alanis, NP     TAKE these medications   acetaminophen 325 MG tablet Commonly known as: TYLENOL Take 650 mg by mouth every 6 (six) hours as needed for mild pain.   acetaminophen 500 MG tablet Commonly known as: TYLENOL Take 500 mg by mouth 2 (two) times daily as needed. For left knee pain   bisacodyl 10 MG suppository Commonly known as: DULCOLAX Place 10 mg rectally See admin instructions. If not relieved by MOM give 10mg  Bisacodyl supp. Rectally for x1 dose in 24 hours prn constipation   cyanocobalamin 1000 MCG tablet Take 1 tablet (1,000 mcg total) by mouth daily.   diclofenac Sodium 1 % Gel Commonly known as: Voltaren Apply 4 g topically 4 (four) times daily. To bilateral knees and ankles   feeding supplement Liqd Take 237 mLs by mouth 3 (three) times daily.   gabapentin 100 MG capsule Commonly known as: NEURONTIN Take 100 mg by mouth at bedtime.   levocetirizine 5 MG tablet Commonly known as: XYZAL Take 5 mg by mouth daily.   metoprolol succinate 25 MG 24 hr tablet Commonly known as: TOPROL-XL Take 12.5 mg by mouth at bedtime.   Myrbetriq 25 MG Tb24 tablet Generic drug: mirabegron ER Take 25 mg by mouth daily.   NON FORMULARY Magic Cup L/D Meals.   ORAJEL MT Use as directed in the mouth or throat. Apply to mouth lesion two times as needed fro 7 days   OXYGEN Inhale 2 L into the lungs continuous.   Probiotic 250 MG Caps Take 1 capsule by  mouth every morning. For 7 days   RA SALINE ENEMA RE Place 1 Container rectally See admin instructions. If not relieved by bisacodyl supp. Give disposable saline enema rectally x1 dose 24 hours prn constipation   simvastatin 20 MG tablet Commonly known as: ZOCOR Take 20 mg by mouth at bedtime.   timolol 0.5 % ophthalmic solution Commonly known as: TIMOPTIC Place 1 drop into both eyes at bedtime.   UTI-Stat Liqd Take 30 mLs by mouth daily in the afternoon. For UTI prophylaxis   warfarin 3 MG tablet Commonly known as: COUMADIN Take as directed by the anticoagulation clinic. If you are unsure how to take this medication, talk to your nurse or doctor. Original instructions: Take 3 mg by mouth. On Mon,Wed, Thrus, Fri, Sun What changed: Another medication with the same name was removed. Continue taking this medication, and follow the directions you see here. Changed by: Yvonna Alanis, NP   Coumadin 4 MG  tablet Generic drug: warfarin Take as directed by the anticoagulation clinic. If you are unsure how to take this medication, talk to your nurse or doctor. Original instructions: Take 4 mg by mouth daily. On Tues, and Sat What changed: Another medication with the same name was removed. Continue taking this medication, and follow the directions you see here. Changed by: Yvonna Alanis, NP       Review of Systems  Constitutional: Negative for activity change and appetite change.  Respiratory: Positive for shortness of breath. Negative for cough and wheezing.   Cardiovascular: Negative for chest pain and leg swelling.  Gastrointestinal: Negative for blood in stool.  Genitourinary: Negative for hematuria.  Hematological: Bruises/bleeds easily.  Psychiatric/Behavioral: Positive for confusion. Negative for dysphoric mood. The patient is not nervous/anxious.     Immunization History  Administered Date(s) Administered  . Fluad Quad(high Dose 65+) 07/28/2020  . Influenza, High Dose Seasonal  PF 06/27/2016, 05/23/2017, 06/08/2018  . Influenza-Unspecified 06/09/2020  . Moderna Sars-Covid-2 Vaccination 11/12/2019, 12/10/2019  . Pneumococcal Conjugate-13 01/04/2017  . Pneumococcal Polysaccharide-23 01/17/2018   Pertinent  Health Maintenance Due  Topic Date Due  . INFLUENZA VACCINE  Completed  . DEXA SCAN  Completed  . PNA vac Low Risk Adult  Completed   No flowsheet data found. Functional Status Survey:    Vitals:   10/26/20 1157  BP: 132/66  Pulse: 69  Resp: 17  Temp: 98.2 F (36.8 C)  Weight: 186 lb (84.4 kg)  Height: 5\' 1"  (1.549 m)   Body mass index is 35.14 kg/m. Physical Exam Vitals reviewed.  Constitutional:      General: She is not in acute distress. HENT:     Head: Normocephalic.  Cardiovascular:     Rate and Rhythm: Normal rate. Rhythm irregular.     Pulses: Normal pulses.     Heart sounds: Normal heart sounds. No murmur heard.   Pulmonary:     Effort: Pulmonary effort is normal. No respiratory distress.     Breath sounds: Normal breath sounds. No wheezing.  Neurological:     General: No focal deficit present.     Mental Status: She is alert. Mental status is at baseline.     Motor: Weakness present.     Gait: Gait abnormal.  Psychiatric:        Mood and Affect: Mood normal.        Behavior: Behavior normal.        Cognition and Memory: Memory is impaired.     Labs reviewed: Recent Labs    02/15/20 0121 02/16/20 0431 07/26/20 0724 07/27/20 0848 07/28/20 0653 08/03/20 1329 08/06/20 0000 08/17/20 0000 08/25/20 0000 08/26/20 0000  NA 138   < > 138 139 140 140   < > 140 138 140  K 3.5   < > 3.2* 3.1* 3.1* 2.8*   < > 2.9* 3.7 3.5  CL 102   < > 105 105 105 102   < > 103 102 102  CO2 23   < > 21* 23 24 27    < > 26* 25* 27*  GLUCOSE 99   < > 87 81 79 95  --   --   --   --   BUN 12   < > 11 11 10  5*   < > 8 8 7   CREATININE 0.82   < > 0.39* 0.40* 0.42* 0.56   < > 0.3* 0.3* 0.4*  CALCIUM 9.0   < > 8.3* 8.5* 8.8* 9.1   < >  8.3* 8.0*  8.5*  MG 1.9   < > 1.5* 1.4* 1.6*  --   --   --   --   --   PHOS 3.0  --  2.4*  --   --   --   --   --   --   --    < > = values in this interval not displayed.   Recent Labs    07/25/20 0124 07/26/20 0724 07/27/20 0848 08/03/20 1329 08/26/20 0000  AST 13*  --  14* 19 18  ALT 13  --  14 19 9   ALKPHOS 68  --  74 85 101  BILITOT 1.5*  --  0.9 0.7  --   PROT 5.6*  --  5.7* 6.3*  --   ALBUMIN 2.7*   < > 2.7* 2.5* 2.5*   < > = values in this interval not displayed.   Recent Labs    07/27/20 0848 07/28/20 0653 08/03/20 1329 08/10/20 0000 08/17/20 0000 08/25/20 0000 09/01/20 0000  WBC 5.9 8.9 11.2*   < > 6.4 6.8 4.5  NEUTROABS 3.1 5.3 7.2  --   --   --   --   HGB 9.2* 9.4* 10.4*   < > 9.5* 9.5* 10.8*  HCT 29.5* 30.6* 32.7*   < > 28* 28* 32*  MCV 92.2 92.4 91.6  --   --   --   --   PLT 46* 38* 175   < > 113* 177 191   < > = values in this interval not displayed.   Lab Results  Component Value Date   TSH 2.458 02/15/2020   Lab Results  Component Value Date   HGBA1C 5.7 (H) 02/15/2020   No results found for: CHOL, HDL, LDLCALC, LDLDIRECT, TRIG, CHOLHDL  Significant Diagnostic Results in last 30 days:  No results found.  Assessment/Plan 1. Persistent atrial fibrillation (HCC) - rate controlled with metoprolol  2. Long term (current) use of anticoagulants - INR 3.5 today, will hold coumadin 03/07 and 03/08 - start coumadin 3 mg po daily on 03/09 - recheck PT/INR 03/14    Family/ staff Communication:   Labs/tests ordered:

## 2020-10-29 ENCOUNTER — Non-Acute Institutional Stay (SKILLED_NURSING_FACILITY): Payer: Medicare HMO | Admitting: Orthopedic Surgery

## 2020-10-29 ENCOUNTER — Encounter: Payer: Self-pay | Admitting: Orthopedic Surgery

## 2020-10-29 DIAGNOSIS — R634 Abnormal weight loss: Secondary | ICD-10-CM

## 2020-10-29 DIAGNOSIS — C3411 Malignant neoplasm of upper lobe, right bronchus or lung: Secondary | ICD-10-CM

## 2020-10-29 DIAGNOSIS — I4819 Other persistent atrial fibrillation: Secondary | ICD-10-CM

## 2020-10-29 DIAGNOSIS — R531 Weakness: Secondary | ICD-10-CM

## 2020-10-29 DIAGNOSIS — Z7901 Long term (current) use of anticoagulants: Secondary | ICD-10-CM

## 2020-10-29 DIAGNOSIS — F0390 Unspecified dementia without behavioral disturbance: Secondary | ICD-10-CM

## 2020-10-29 DIAGNOSIS — I10 Essential (primary) hypertension: Secondary | ICD-10-CM

## 2020-10-29 DIAGNOSIS — E782 Mixed hyperlipidemia: Secondary | ICD-10-CM

## 2020-10-29 NOTE — Progress Notes (Unsigned)
Location:    [place] Room Number: 205-D Place of Service:  SNF [id number]) Provider:  Windell Moulding NP  [name], MD  Patient Care Team: [name], MD as PCP - General (Family Medicine) Lorretta Harp, MD as PCP - Cardiology (Cardiology) Gavin Pound, MD as Consulting Physician (Rheumatology) Valrie Hart, RN as Oncology Nurse Navigator  Extended Emergency Contact Information Primary Emergency Contact: [name] Mobile Phone: [phone] Relation: Daughter Secondary Emergency Contact: [name] Mobile Phone: [phone] Relation: Daughter Preferred language: Cleophus Molt Interpreter needed? No  Code Status: Full Code  Goals of care: Advanced Directive information Advanced Directives 10/29/2020  Does Patient Have a Medical Advance Directive? No  Type of Advance Directive -  Does patient want to make changes to medical advance directive? No - Patient declined  Copy of [place] in Chart? -  Would patient like information on creating a medical advance directive? -     Chief Complaint  Patient presents with  . Medical Management of Chronic Issues    Routine Visit of Medical Management     HPI:  Pt is a 80 y.o. female seen today for medical management of chronic diseases.    She is a resident of [place], seen today at bedside. PMH includes: hypertension, a-fib, lung cancer, rheumatoid arthritis, hyperlipidemia, and generalized weakness.   Today, she is alert to self and familiar faces. Follows commands and can express needs. She still thinks she is receiving chemo st times. She was seen a week ago for left ear pain and swollen lymph node. At this time, her ear pain and swelling have subsided. Recently restarted PT/OT. She continues to work on bed mobility and strength training. She is moderate assist with ADLs. Due to recent therapy sessions, she was discontinued form the restorative  AROM program. She denies chest pain and sob at this time.   No recent falls or injuries. Gluteal skin tear healed.   Recent blood pressures are as follows:  03/09- 124/68  03/08- 128/76  03/07- 132/76  Recent weights are as follows:  - no weight recorded for February 2022  01/24- 186 lbs  01/10- 197.6 lbs  12/30- 202.8 lbs  She continues to be follows by palliative. Scheduled to see Dr. Earlie Server next week.   Facility nurse does not report any concerns, vitals stable.   Past Medical History:  Diagnosis Date  . A-fib ([place])   . Dysrhythmia   . GERD (gastroesophageal reflux disease)   . Hemorrhoids   . Hyperlipidemia   . Hypertension   . Lung cancer ([place]) 2000   RLL  . Obesity   . Persistent atrial fibrillation ([place])   . Rheumatoid arthritis [facility])    Past Surgical History:  Procedure Laterality Date  . BREAST EXCISIONAL BIOPSY Left    x 2  . BREAST EXCISIONAL BIOPSY Right   . BRONCHIAL BIOPSY  02/18/2020   Procedure: BRONCHIAL BIOPSIES;  Surgeon: Collene Gobble, MD;  Location: [facility] ENDOSCOPY;  Service: Cardiopulmonary;;  . BRONCHIAL BRUSHINGS  02/18/2020   Procedure: BRONCHIAL BRUSHINGS;  Surgeon: Collene Gobble, MD;  Location: [place];  Service: Cardiopulmonary;;  . BRONCHIAL NEEDLE ASPIRATION BIOPSY  02/18/2020   Procedure: BRONCHIAL NEEDLE ASPIRATION BIOPSIES;  Surgeon: Collene Gobble, MD;  Location: [place];  Service: Cardiopulmonary;;  . BRONCHIAL WASHINGS  02/18/2020   Procedure: BRONCHIAL WASHINGS;  Surgeon: Collene Gobble, MD;  Location: [place];  Service:  Cardiopulmonary;;  . CARDIOVERSION N/A 01/09/2018   Procedure: CARDIOVERSION;  Surgeon: Josue Hector, MD;  Location: [facility] ENDOSCOPY;  Service: Cardiovascular;  Laterality: N/A;  . CARDIOVERSION N/A 03/01/2018   Procedure: CARDIOVERSION;  Surgeon: Sueanne Margarita, MD;  Location: [facility] ENDOSCOPY;  Service: Cardiovascular;  Laterality: N/A;  . ELECTROMAGNETIC NAVIGATION BROCHOSCOPY N/A 02/18/2020    Procedure: ELECTROMAGNETIC NAVIGATION BRONCHOSCOPY;  Surgeon: Collene Gobble, MD;  Location: [place];  Service: Cardiopulmonary;  Laterality: N/A;  . KNEE SURGERY    . LUNG LOBECTOMY Right 2000   RLL removed  . VIDEO BRONCHOSCOPY N/A 02/18/2020   Procedure: VIDEO BRONCHOSCOPY WITH FLUORO;  Surgeon: Collene Gobble, MD;  Location[facility] ENDOSCOPY;  Service: Cardiopulmonary;  Laterality: N/A;  . VIDEO BRONCHOSCOPY WITH ENDOBRONCHIAL ULTRASOUND N/A 04/22/2020   Procedure: VIDEO BRONCHOSCOPY WITH ENDOBRONCHIAL ULTRASOUND;  Surgeon: [name], MD;  Location: [facility] OR;  Service: Thoracic;  Laterality: N/A;    Allergies  Allergen Reactions  . Morphine Anaphylaxis  . Morphine And Related Other (See Comments)    "vitals go down", pass out  . Sulfa Antibiotics Rash    Allergies as of 10/29/2020      Reactions   Morphine Anaphylaxis   Morphine And Related Other (See Comments)   "vitals go down", pass out   Sulfa Antibiotics Rash      Medication List       Accurate as of October 29, 2020  1:44 PM. If you have any questions, ask your nurse or doctor.        STOP taking these medications   ORAJEL MT Stopped by: Yvonna Alanis, NP   Probiotic 250 MG Caps Stopped by: Yvonna Alanis, NP     TAKE these medications   acetaminophen 325 MG tablet Commonly known as: TYLENOL Take 650 mg by mouth every 6 (six) hours as needed for mild pain.   acetaminophen 500 MG tablet Commonly known as: TYLENOL Take 500 mg by mouth 2 (two) times daily as needed. For left knee pain   bisacodyl 10 MG suppository Commonly known as: DULCOLAX Place 10 mg rectally See admin instructions. If not relieved by MOM give 10mg  Bisacodyl supp. Rectally for x1 dose in 24 hours prn constipation   cyanocobalamin 1000 MCG tablet Take 1 tablet (1,000 mcg total) by mouth daily.   diclofenac Sodium 1 % Gel Commonly known as: Voltaren Apply 4 g topically 4 (four) times daily. To bilateral knees and ankles   feeding  supplement Liqd Take 237 mLs by mouth 3 (three) times daily.   gabapentin 100 MG capsule Commonly known as: NEURONTIN Take 100 mg by mouth at bedtime.   levocetirizine 5 MG tablet Commonly known as: XYZAL Take 5 mg by mouth daily.   metoprolol succinate 25 MG 24 hr tablet Commonly known as: TOPROL-XL Take 12.5 mg by mouth at bedtime.   Myrbetriq 25 MG Tb24 tablet Generic drug: mirabegron ER Take 25 mg by mouth daily.   NON FORMULARY Magic Cup L/D Meals.   OXYGEN Inhale 2 L into the lungs continuous.   RA SALINE ENEMA RE Place 1 Container rectally See admin instructions. If not relieved by bisacodyl supp. Give disposable saline enema rectally x1 dose 24 hours prn constipation   simvastatin 20 MG tablet Commonly known as: ZOCOR Take 20 mg by mouth at bedtime.   timolol 0.5 % ophthalmic solution Commonly known as: TIMOPTIC Place 1 drop into both eyes at bedtime.   UTI-Stat Liqd Take 30 mLs by  mouth daily in the afternoon. For UTI prophylaxis   warfarin 3 MG tablet Commonly known as: COUMADIN Take 3 mg by mouth daily in the afternoon. What changed: Another medication with the same name was removed. Continue taking this medication, and follow the directions you see here. Changed by: Yvonna Alanis, NP       Review of Systems  Constitutional: Negative for activity change, appetite change and fever.  HENT: Negative for dental problem, hearing loss and trouble swallowing.   Eyes: Negative for visual disturbance.       Glasses  Respiratory: Negative for cough, shortness of breath and wheezing.        Supplemental oxygen use  Cardiovascular: Negative for chest pain and leg swelling.  Gastrointestinal: Negative for abdominal distention, abdominal pain, constipation, diarrhea and nausea.  Genitourinary: Negative for dysuria, frequency and hematuria.  Musculoskeletal: Positive for arthralgias and myalgias.       Bilateral knee pain  Neurological: Positive for weakness.  Negative for dizziness and headaches.  Hematological: Bruises/bleeds easily.  Psychiatric/Behavioral: Positive for confusion. Negative for dysphoric mood. The patient is not nervous/anxious.     Immunization History  Administered Date(s) Administered  . Fluad Quad(high Dose 65+) 07/28/2020  . Influenza, High Dose Seasonal PF 06/27/2016, 05/23/2017, 06/08/2018  . Influenza-Unspecified 06/09/2020  . Moderna Sars-Covid-2 Vaccination 11/12/2019, 12/10/2019  . Pneumococcal Conjugate-13 01/04/2017  . Pneumococcal Polysaccharide-23 01/17/2018   Pertinent  Health Maintenance Due  Topic Date Due  . INFLUENZA VACCINE  Completed  . DEXA SCAN  Completed  . PNA vac Low Risk Adult  Completed   No flowsheet data found. Functional Status Survey:    Vitals:   10/29/20 1342  BP: 124/68  Pulse: 77  Resp: 16  Temp: 98 F (36.7 C)  Weight: 186 lb (84.4 kg)  Height: 5\' 1"  (1.549 m)   Body mass index is 35.14 kg/m. Physical Exam Vitals reviewed.  Constitutional:      General: She is not in acute distress. HENT:     Head: Normocephalic.     Right Ear: There is no impacted cerumen.     Left Ear: There is no impacted cerumen.     Nose: Nose normal.     Mouth/Throat:     Mouth: Mucous membranes are moist.  Eyes:     General:        Right eye: No discharge.        Left eye: No discharge.  Cardiovascular:     Rate and Rhythm: Normal rate. Rhythm irregular.     Pulses: Normal pulses.     Heart sounds: Normal heart sounds. No murmur heard.   Pulmonary:     Effort: Pulmonary effort is normal. No respiratory distress.     Breath sounds: Normal breath sounds. No wheezing.  Abdominal:     General: Abdomen is flat. Bowel sounds are normal. There is no distension.     Palpations: Abdomen is soft.     Tenderness: There is no abdominal tenderness.  Musculoskeletal:     Cervical back: Normal range of motion.     Right lower leg: No edema.     Left lower leg: No edema.  Lymphadenopathy:      Cervical: No cervical adenopathy.  Skin:    General: Skin is warm and dry.     Capillary Refill: Capillary refill takes less than 2 seconds.  Neurological:     General: No focal deficit present.     Mental Status: She is alert.  Mental status is at baseline.     Motor: Weakness present.     Gait: Gait abnormal.     Comments: Wheelchair/lift  Psychiatric:        Mood and Affect: Mood normal.        Behavior: Behavior normal.     Labs reviewed: Recent Labs    02/15/20 0121 02/16/20 0431 07/26/20 0724 07/27/20 0848 07/28/20 0653 08/03/20 1329 08/06/20 0000 08/17/20 0000 08/25/20 0000 08/26/20 0000  NA 138   < > 138 139 140 140   < > 140 138 140  K 3.5   < > 3.2* 3.1* 3.1* 2.8*   < > 2.9* 3.7 3.5  CL 102   < > 105 105 105 102   < > 103 102 102  CO2 23   < > 21* 23 24 27    < > 26* 25* 27*  GLUCOSE 99   < > 87 81 79 95  --   --   --   --   BUN 12   < > 11 11 10  5*   < > 8 8 7   CREATININE 0.82   < > 0.39* 0.40* 0.42* 0.56   < > 0.3* 0.3* 0.4*  CALCIUM 9.0   < > 8.3* 8.5* 8.8* 9.1   < > 8.3* 8.0* 8.5*  MG 1.9   < > 1.5* 1.4* 1.6*  --   --   --   --   --   PHOS 3.0  --  2.4*  --   --   --   --   --   --   --    < > = values in this interval not displayed.   Recent Labs    07/25/20 0124 07/26/20 0724 07/27/20 0848 08/03/20 1329 08/26/20 0000  AST 13*  --  14* 19 18  ALT 13  --  14 19 9   ALKPHOS 68  --  74 85 101  BILITOT 1.5*  --  0.9 0.7  --   PROT 5.6*  --  5.7* 6.3*  --   ALBUMIN 2.7*   < > 2.7* 2.5* 2.5*   < > = values in this interval not displayed.   Recent Labs    07/27/20 0848 07/28/20 0653 08/03/20 1329 08/10/20 0000 08/17/20 0000 08/25/20 0000 09/01/20 0000  WBC 5.9 8.9 11.2*   < > 6.4 6.8 4.5  NEUTROABS 3.1 5.3 7.2  --   --   --   --   HGB 9.2* 9.4* 10.4*   < > 9.5* 9.5* 10.8*  HCT 29.5* 30.6* 32.7*   < > 28* 28* 32*  MCV 92.2 92.4 91.6  --   --   --   --   PLT 46* 38* 175   < > 113* 177 191   < > = values in this interval not displayed.    Lab Results  Component Value Date   TSH 2.458 02/15/2020   Lab Results  Component Value Date   HGBA1C 5.7 (H) 02/15/2020   No results found for: CHOL, HDL, LDLCALC, LDLDIRECT, TRIG, CHOLHDL  Significant Diagnostic Results in last 30 days:  No results found.  Assessment/Plan 1. Persistent atrial fibrillation (HCC) - rate controlled with metoprolol - cont coumadin for clot prevention - cont PT/INR checks  2. Long term (current) use of anticoagulants - cont PT/INR checks  3. Generalized weakness - ongoing, mostly bedbound - working with PT/OT on bed mobility and strengthening  4. Essential  hypertension - bp at goal < 150/90 - cont metoprolol  5. Mixed hyperlipidemia - stable with statin  6. Small cell lung cancer, right upper lobe (HCC) - stable, breathing unlabored, does not use oxygen all the time - followed by Dr. Earlie Server- f/u next week  7. Dementia without behavioral disturbance, unspecified dementia type ([place]) - no behavioral outbursts reported  8. Weight loss - February weight not recorded - past trends indicate weight loss - cont magic cups and ensure - weight patient twice monthly    Family/ staff Communication: plan discussed with patient and facility  Labs/tests ordered:  none

## 2020-11-02 ENCOUNTER — Inpatient Hospital Stay: Payer: Medicare HMO | Attending: Internal Medicine

## 2020-11-02 ENCOUNTER — Encounter (HOSPITAL_COMMUNITY): Payer: Self-pay

## 2020-11-02 ENCOUNTER — Ambulatory Visit (HOSPITAL_COMMUNITY)
Admission: RE | Admit: 2020-11-02 | Discharge: 2020-11-02 | Disposition: A | Discharge status: Home / Self Care | Payer: Medicare HMO | Source: Ambulatory Visit | Attending: Internal Medicine | Admitting: Internal Medicine

## 2020-11-02 ENCOUNTER — Other Ambulatory Visit: Payer: Self-pay

## 2020-11-02 DIAGNOSIS — R599 Enlarged lymph nodes, unspecified: Secondary | ICD-10-CM | POA: Insufficient documentation

## 2020-11-02 DIAGNOSIS — Z9221 Personal history of antineoplastic chemotherapy: Secondary | ICD-10-CM | POA: Diagnosis not present

## 2020-11-02 DIAGNOSIS — R5383 Other fatigue: Secondary | ICD-10-CM | POA: Diagnosis not present

## 2020-11-02 DIAGNOSIS — R0609 Other forms of dyspnea: Secondary | ICD-10-CM | POA: Diagnosis not present

## 2020-11-02 DIAGNOSIS — C3411 Malignant neoplasm of upper lobe, right bronchus or lung: Secondary | ICD-10-CM | POA: Insufficient documentation

## 2020-11-02 DIAGNOSIS — C3490 Malignant neoplasm of unspecified part of unspecified bronchus or lung: Secondary | ICD-10-CM | POA: Diagnosis not present

## 2020-11-02 DIAGNOSIS — R42 Dizziness and giddiness: Secondary | ICD-10-CM | POA: Diagnosis not present

## 2020-11-02 LAB — CMP (CANCER CENTER ONLY)
ALT: 10 U/L (ref 0–44)
AST: 20 U/L (ref 15–41)
Albumin: 2.7 g/dL — ABNORMAL LOW (ref 3.5–5.0)
Alkaline Phosphatase: 68 U/L (ref 38–126)
Anion gap: 13 (ref 5–15)
BUN: 6 mg/dL — ABNORMAL LOW (ref 8–23)
CO2: 25 mmol/L (ref 22–32)
Calcium: 9.5 mg/dL (ref 8.9–10.3)
Chloride: 100 mmol/L (ref 98–111)
Creatinine: 0.6 mg/dL (ref 0.44–1.00)
GFR, Estimated: >60 mL/min (ref >60)
Glucose, Bld: 76 mg/dL (ref 70–99)
Potassium: 3.4 mmol/L — ABNORMAL LOW (ref 3.5–5.1)
Sodium: 138 mmol/L (ref 135–145)
Total Bilirubin: 0.5 mg/dL (ref 0.3–1.2)
Total Protein: 7.2 g/dL (ref 6.5–8.1)

## 2020-11-02 LAB — CBC WITH DIFFERENTIAL (CANCER CENTER ONLY)
Abs Immature Granulocytes: 0.01 10*3/uL (ref 0.00–0.07)
Basophils Absolute: 0.1 10*3/uL (ref 0.0–0.1)
Basophils Relative: 1 %
Eosinophils Absolute: 0.2 10*3/uL (ref 0.0–0.5)
Eosinophils Relative: 3 %
HCT: 44.7 % (ref 36.0–46.0)
Hemoglobin: 14.7 g/dL (ref 12.0–15.0)
Immature Granulocytes: 0 %
Lymphocytes Relative: 23 %
Lymphs Abs: 1.5 10*3/uL (ref 0.7–4.0)
MCH: 31.9 pg (ref 26.0–34.0)
MCHC: 32.9 g/dL (ref 30.0–36.0)
MCV: 97 fL (ref 80.0–100.0)
Monocytes Absolute: 0.6 10*3/uL (ref 0.1–1.0)
Monocytes Relative: 10 %
Neutro Abs: 3.9 10*3/uL (ref 1.7–7.7)
Neutrophils Relative %: 63 %
Platelet Count: 165 10*3/uL (ref 150–400)
RBC: 4.61 MIL/uL (ref 3.87–5.11)
RDW: 15.9 % — ABNORMAL HIGH (ref 11.5–15.5)
WBC Count: 6.2 10*3/uL (ref 4.0–10.5)
nRBC: 0 % (ref 0.0–0.2)

## 2020-11-02 IMAGING — CT CT CHEST W/ CM
2 of 4 series · 15 of 36 positions shown, 18 images · IV contrast (OMNIPAQUE)
Comparison: [DATE]

CLINICAL DATA: Small-cell lung cancer, staging.

EXAM:
CT CHEST WITH CONTRAST
TECHNIQUE: Multidetector CT imaging of the chest was performed during
intravenous contrast administration.
CONTRAST:  75mL OMNIPAQUE IOHEXOL 300 MG/ML  SOLN

[Series 2: axial st · axial · 0.73mm/px · z∈[+1196,+1458]mm · 12 of 155 slices shown, 15 images]
[im 12/155  mediastinal]
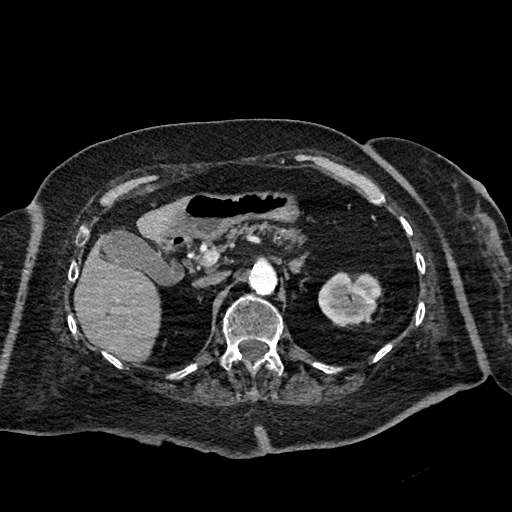
[im 12/155  lung]
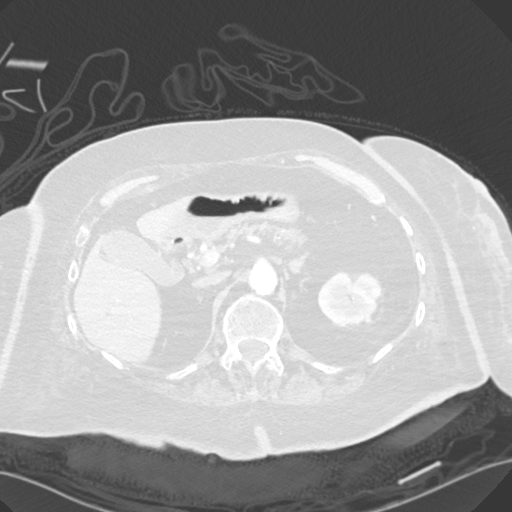
[im 24/155  lung]
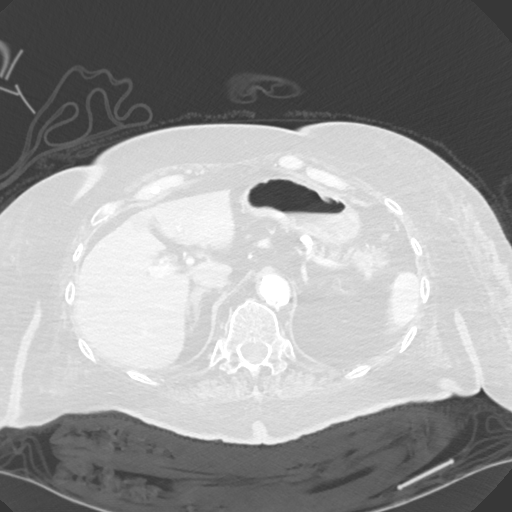
[im 36/155  lung]
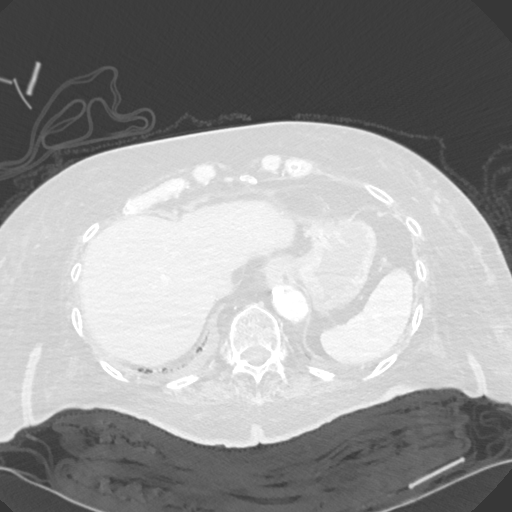
[im 48/155  lung]
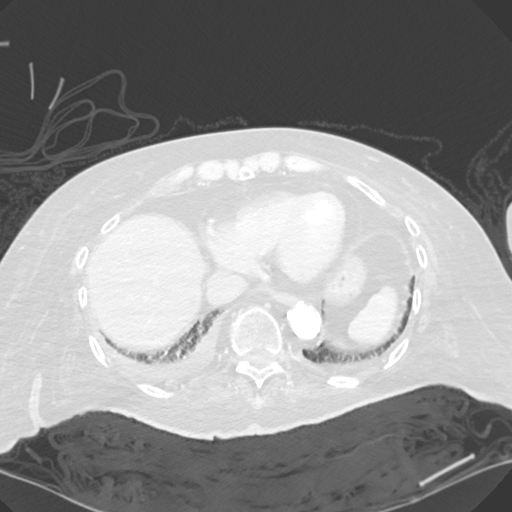
[im 60/155  mediastinal]
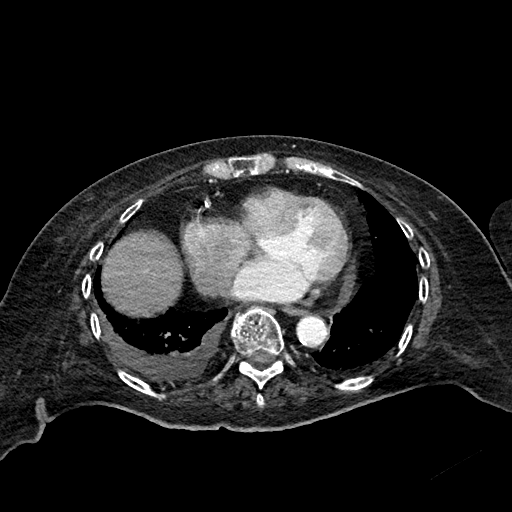
[im 60/155  lung]
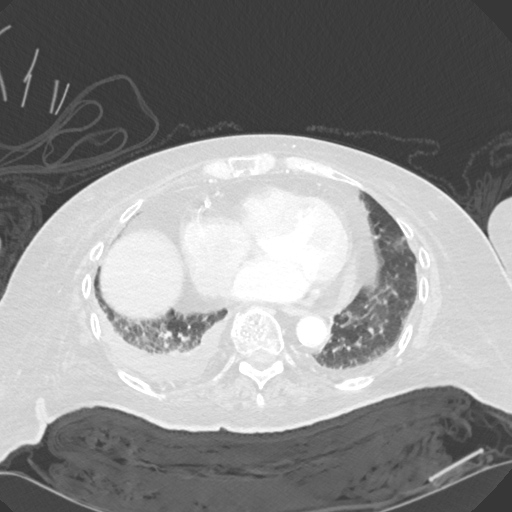
[im 72/155  lung]
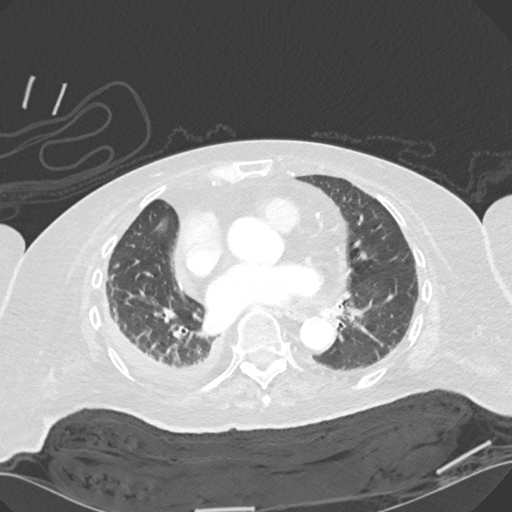
[im 83/155  lung]
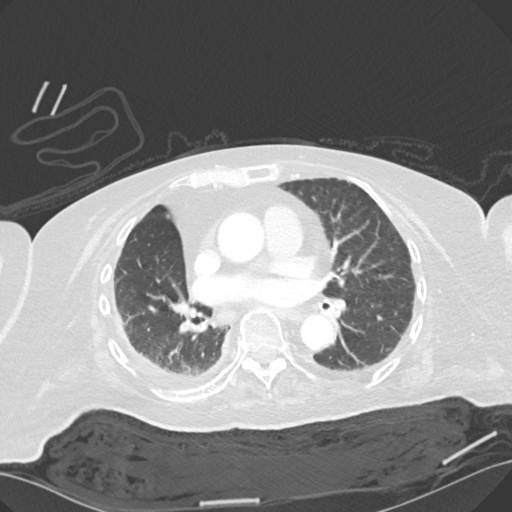
[im 95/155  lung]
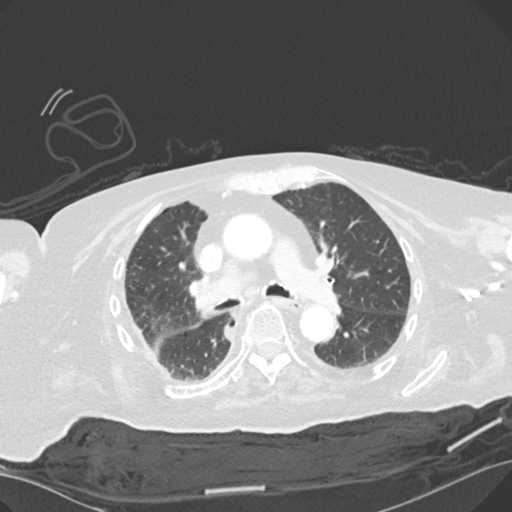
[im 107/155  mediastinal]
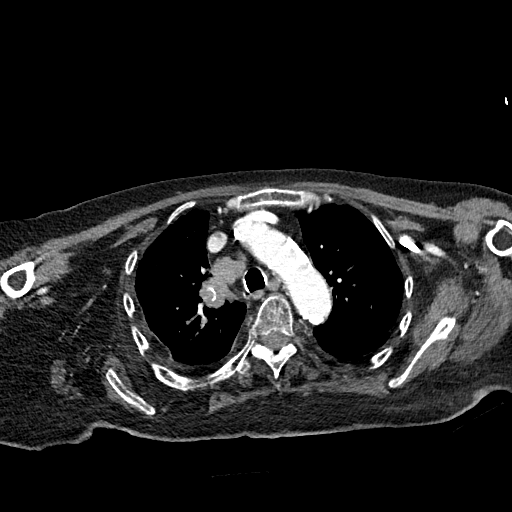
[im 107/155  lung]
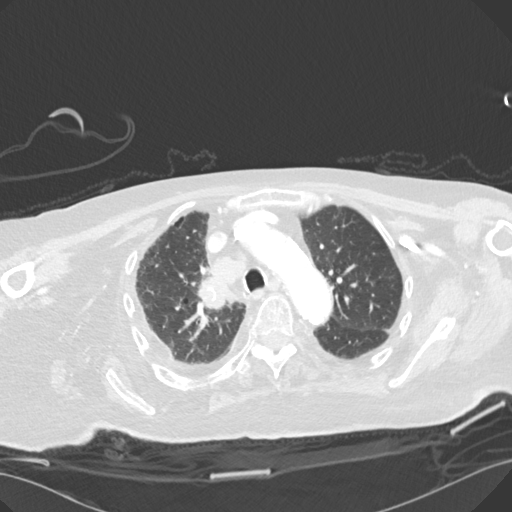
[im 119/155  lung]
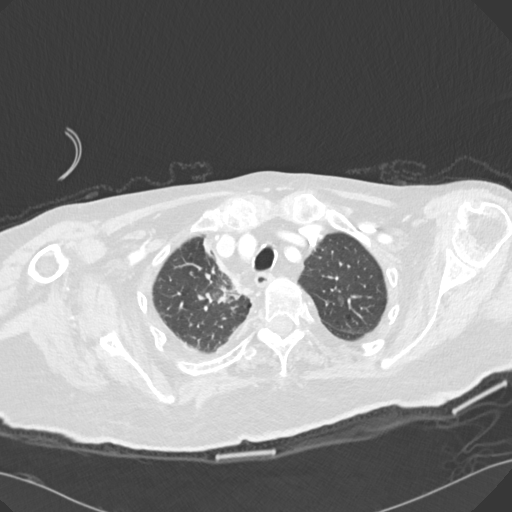
[im 131/155  lung]
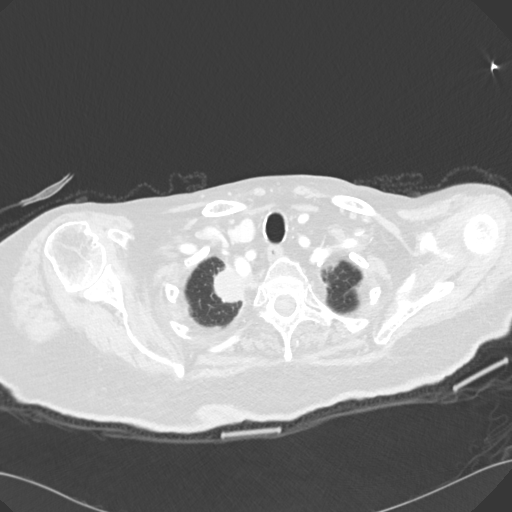
[im 143/155  lung]
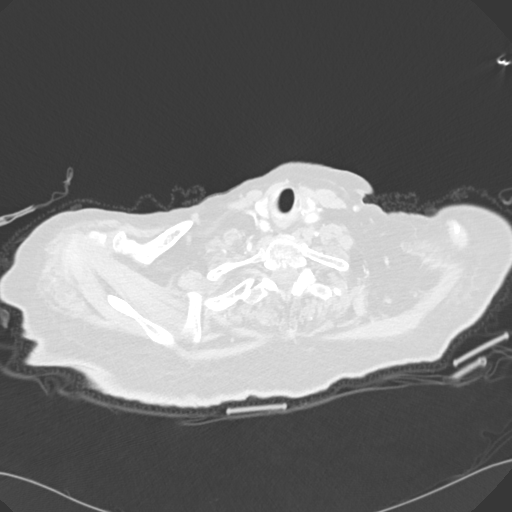

[Series 6: coronal · coronal · 0.61mm/px · 3 of 107 slices shown]
[im 22/107  lung]
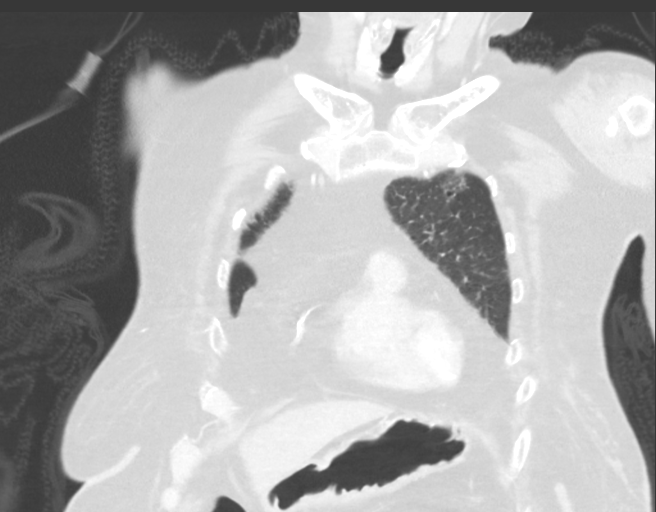
[im 43/107  lung]
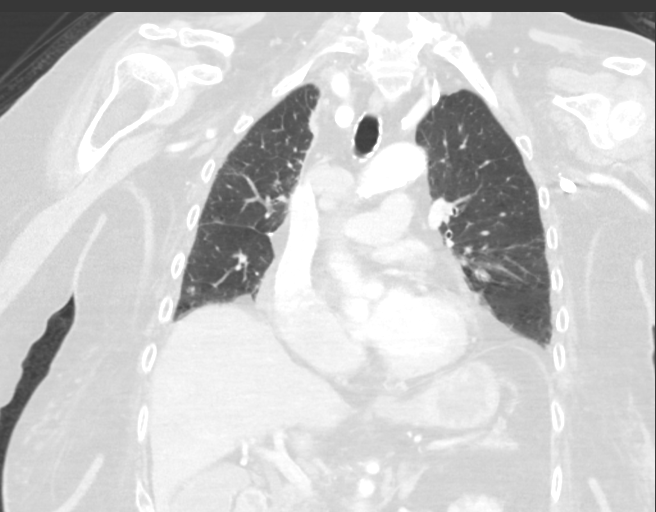
[im 64/107  lung]
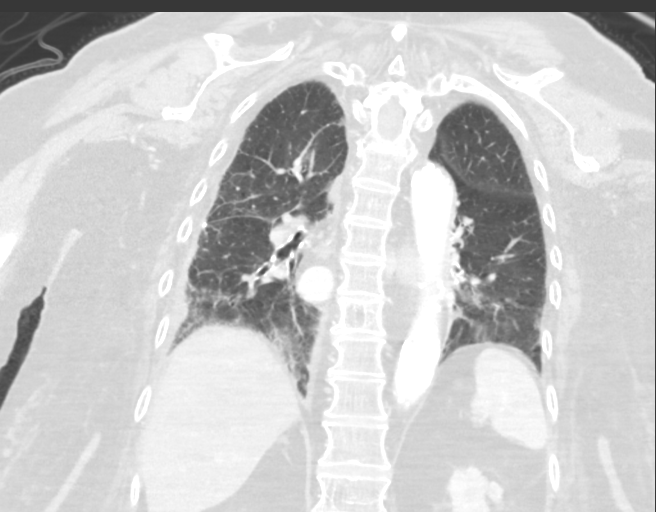

[15 of 36 positions shown; findings below may reference images not displayed]

FINDINGS: Cardiovascular: Advanced aortic and branch vessel atherosclerosis.
Tortuous thoracic aorta. Mild cardiomegaly. Multivessel coronary
artery atherosclerosis. No central pulmonary embolism, on this
non-dedicated study.

Mediastinum/Nodes: No supraclavicular adenopathy. New right
paratracheal adenopathy at 2.1 cm on 53/2. Compare 9 mm on the
prior.

Right hilar node of 1.4 cm is mildly enlarged increased from 1.2 cm
on the prior.

Lungs/Pleura: Small right pleural effusion, similar. Surgical
changes, likely of right middle lobectomy.

Centrilobular and paraseptal emphysema.

Significant enlargement of a pleural based right upper lobe nodule.
2.5 x 2.2 cm on [DATE] versus 1.2 x 0.9 cm on the prior CT.
Demonstrates direct invasion into the right-side of the mediastinum,
including on [DATE].

The more central right upper lobe lung lesion has markedly enlarged,
including at 2.8 x 2.3 cm on 45/5. Compare 9 x 8 mm on the prior.

Right lung base 5 mm nodule on 86/5 is similar.

Mild limitations secondary to patient arm position and body habitus.

Upper Abdomen: Right hepatic lobe well-circumscribed low-density
lesions are likely cysts. Focal steatosis adjacent the falciform
ligament. Normal imaged portions of the spleen, stomach, pancreas,
gallbladder, adrenal glands. Left renal cortical thinning and
scarring. Low-density left renal lesions are too small to
characterize but likely cysts. 4 mm interpolar left renal collecting
system calculus.

Musculoskeletal: Osteopenia.
IMPRESSION: 1. Marked disease progression, as evidenced by enlargement of right
upper lobe pulmonary nodules and new/progressive thoracic
adenopathy.
2. Similar small right pleural effusion.
3. Aortic atherosclerosis ([C6]-[C6]), coronary artery
atherosclerosis and emphysema ([C6]-[C6]).
4. Left nephrolithiasis.
5. Mild limitations as detailed above.

## 2020-11-02 MED ORDER — IOHEXOL 300 MG/ML  SOLN
75.0000 mL | Freq: Once | INTRAMUSCULAR | Status: AC | PRN
  Administered 2020-11-02: 75 mL via INTRAVENOUS

## 2020-11-04 ENCOUNTER — Encounter: Payer: Self-pay | Admitting: Orthopedic Surgery

## 2020-11-04 ENCOUNTER — Non-Acute Institutional Stay (SKILLED_NURSING_FACILITY): Payer: Medicare HMO | Admitting: Orthopedic Surgery

## 2020-11-04 DIAGNOSIS — R634 Abnormal weight loss: Secondary | ICD-10-CM

## 2020-11-04 DIAGNOSIS — C3411 Malignant neoplasm of upper lobe, right bronchus or lung: Secondary | ICD-10-CM | POA: Diagnosis not present

## 2020-11-04 DIAGNOSIS — R630 Anorexia: Secondary | ICD-10-CM | POA: Diagnosis not present

## 2020-11-04 NOTE — Progress Notes (Signed)
Location:  [facility] Room Number: 205/D Place of Service:  SNF [phone]) Provider: Windell Moulding, AGNP-C  [name], MD  Patient Care Team: [name], MD as PCP - General (Family Medicine) Lorretta Harp, MD as PCP - Cardiology (Cardiology) Gavin Pound, MD as Consulting Physician (Rheumatology) Valrie Hart, RN as Oncology Nurse Navigator  Extended Emergency Contact Information Primary Emergency Contact: [name] Mobile Phone: [phone] Relation: Daughter Secondary Emergency Contact: [name] Mobile Phone: [phone] Relation: Daughter Preferred language: English Interpreter needed? No  Goals of care: Advanced Directive information Advanced Directives 10/29/2020  Does Patient Have a Medical Advance Directive? No  Type of Advance Directive -  Does patient want to make changes to medical advance directive? No - Patient declined  Copy of [place] in Chart? -  Would patient like information on creating a medical advance directive? -     Chief Complaint  Patient presents with  . Acute Visit    Weight loss    HPI:  Pt is a 80 y.o. female seen today for acute visit for weight loss.   Facility dietician reports she is eating less than 50% of meals, refused 2 meals within past week and also known to refuse supplement shakes. Eila states" I do not like the food here." She also states " I thought I had to lose weight."   No recent falls, injuries or behavioral outbursts.   Facility nurse does not report any concerns, vitals stable.      Past Medical History:  Diagnosis Date  . A-fib ([place])   . Dysrhythmia   . GERD (gastroesophageal reflux disease)   . Hemorrhoids   . Hyperlipidemia   . Hypertension   . Lung cancer ([place]) 2000   RLL  . Obesity   . Persistent atrial fibrillation ([place])   . Rheumatoid arthritis [facility])    Past Surgical History:  Procedure Laterality Date  . BREAST EXCISIONAL  BIOPSY Left    x 2  . BREAST EXCISIONAL BIOPSY Right   . BRONCHIAL BIOPSY  02/18/2020   Procedure: BRONCHIAL BIOPSIES;  Surgeon: Collene Gobble, MD;  Location: [facility] ENDOSCOPY;  Service: Cardiopulmonary;;  . BRONCHIAL BRUSHINGS  02/18/2020   Procedure: BRONCHIAL BRUSHINGS;  Surgeon: Collene Gobble, MD;  Location: [place];  Service: Cardiopulmonary;;  . BRONCHIAL NEEDLE ASPIRATION BIOPSY  02/18/2020   Procedure: BRONCHIAL NEEDLE ASPIRATION BIOPSIES;  Surgeon: Collene Gobble, MD;  Location: [place];  Service: Cardiopulmonary;;  . BRONCHIAL WASHINGS  02/18/2020   Procedure: BRONCHIAL WASHINGS;  Surgeon: Collene Gobble, MD;  Location: [place];  Service: Cardiopulmonary;;  . CARDIOVERSION N/A 01/09/2018   Procedure: CARDIOVERSION;  Surgeon: Josue Hector, MD;  Location: [facility] ENDOSCOPY;  Service: Cardiovascular;  Laterality: N/A;  . CARDIOVERSION N/A 03/01/2018   Procedure: CARDIOVERSION;  Surgeon: Sueanne Margarita, MD;  Location: [facility] ENDOSCOPY;  Service: Cardiovascular;  Laterality: N/A;  . ELECTROMAGNETIC NAVIGATION BROCHOSCOPY N/A 02/18/2020   Procedure: ELECTROMAGNETIC NAVIGATION BRONCHOSCOPY;  Surgeon: Collene Gobble, MD;  Location: [place];  Service: Cardiopulmonary;  Laterality: N/A;  . KNEE SURGERY    . LUNG LOBECTOMY Right 2000   RLL removed  . VIDEO BRONCHOSCOPY N/A 02/18/2020   Procedure: VIDEO BRONCHOSCOPY WITH FLUORO;  Surgeon: Collene Gobble, MD;  Location: [facility] ENDOSCOPY;  Service: Cardiopulmonary;  Laterality: N/A;  . VIDEO BRONCHOSCOPY WITH ENDOBRONCHIAL ULTRASOUND N/A 04/22/2020   Procedure: VIDEO BRONCHOSCOPY WITH ENDOBRONCHIAL ULTRASOUND;  Surgeon: Modesto Charon  C, MD;  Location: [facility] OR;  Service: Thoracic;  Laterality: N/A;    Allergies  Allergen Reactions  . Morphine Anaphylaxis  . Morphine And Related Other (See Comments)    "vitals go down", pass out  . Sulfa Antibiotics Rash    Outpatient Encounter Medications as of 11/04/2020  Medication Sig  .  acetaminophen (TYLENOL) 325 MG tablet Take 650 mg by mouth every 6 (six) hours as needed for mild pain.  Marland Kitchen acetaminophen (TYLENOL) 500 MG tablet Take 500 mg by mouth 2 (two) times daily as needed. For left knee pain  . bisacodyl (DULCOLAX) 10 MG suppository Place 10 mg rectally See admin instructions. If not relieved by MOM give 10mg  Bisacodyl supp. Rectally for x1 dose in 24 hours prn constipation   . Cranberry-Vitamin C-Inulin (UTI-STAT) LIQD Take 30 mLs by mouth daily in the afternoon. For UTI prophylaxis  . diclofenac Sodium (VOLTAREN) 1 % GEL Apply 4 g topically 4 (four) times daily. To bilateral knees and ankles  . feeding supplement (ENSURE ENLIVE / ENSURE PLUS) LIQD Take 237 mLs by mouth 3 (three) times daily.  Marland Kitchen gabapentin (NEURONTIN) 100 MG capsule Take 100 mg by mouth at bedtime.  Marland Kitchen levocetirizine (XYZAL) 5 MG tablet Take 5 mg by mouth daily.   . metoprolol succinate (TOPROL-XL) 25 MG 24 hr tablet Take 12.5 mg by mouth at bedtime.   Marland Kitchen MYRBETRIQ 25 MG TB24 tablet Take 25 mg by mouth daily.  . NON FORMULARY Magic Cup L/D Meals.  . OXYGEN Inhale 2 L into the lungs continuous.  . simvastatin (ZOCOR) 20 MG tablet Take 20 mg by mouth at bedtime.  . Sodium Phosphates (RA SALINE ENEMA RE) Place 1 Container rectally See admin instructions. If not relieved by bisacodyl supp. Give disposable saline enema rectally x1 dose 24 hours prn constipation   . timolol (TIMOPTIC) 0.5 % ophthalmic solution Place 1 drop into both eyes at bedtime.   . vitamin B-12 1000 MCG tablet Take 1 tablet (1,000 mcg total) by mouth daily.  Marland Kitchen warfarin (COUMADIN) 3 MG tablet Take 3 mg by mouth daily in the afternoon.   No facility-administered encounter medications on file as of 11/04/2020.    Review of Systems  Constitutional: Positive for appetite change and fatigue. Negative for activity change and fever.  Respiratory: Negative for cough, shortness of breath and wheezing.   Cardiovascular: Negative for chest pain and  leg swelling.  Psychiatric/Behavioral: Positive for confusion. Negative for dysphoric mood. The patient is not nervous/anxious.     Immunization History  Administered Date(s) Administered  . Fluad Quad(high Dose 65+) 07/28/2020  . Influenza, High Dose Seasonal PF 06/27/2016, 05/23/2017, 06/08/2018  . Influenza-Unspecified 06/09/2020  . Moderna Sars-Covid-2 Vaccination 11/12/2019, 12/10/2019  . Pneumococcal Conjugate-13 01/04/2017  . Pneumococcal Polysaccharide-23 01/17/2018   Pertinent  Health Maintenance Due  Topic Date Due  . INFLUENZA VACCINE  Completed  . DEXA SCAN  Completed  . PNA vac Low Risk Adult  Completed   No flowsheet data found. Functional Status Survey:    Vitals:   11/04/20 1235  BP: 118/68  Pulse: 74  Resp: 18  Temp: 98 F (36.7 C)  Weight: 188 lb 9.6 oz (85.5 kg)   Body mass index is 35.64 kg/m. Physical Exam Vitals reviewed.  Constitutional:      General: She is not in acute distress.    Appearance: She is obese.  HENT:     Head: Normocephalic.  Cardiovascular:     Rate and Rhythm:  Normal rate. Rhythm irregular.     Pulses: Normal pulses.     Heart sounds: Normal heart sounds. No murmur heard.   Pulmonary:     Effort: Pulmonary effort is normal. No respiratory distress.     Breath sounds: Normal breath sounds. No wheezing.  Abdominal:     General: Bowel sounds are normal. There is no distension.     Palpations: Abdomen is soft.     Tenderness: There is no abdominal tenderness.  Neurological:     General: No focal deficit present.     Mental Status: She is alert. Mental status is at baseline.     Motor: Weakness present.     Gait: Gait abnormal.  Psychiatric:        Mood and Affect: Mood normal.        Behavior: Behavior normal.        Cognition and Memory: Memory is impaired.     Labs reviewed: Recent Labs    02/15/20 0121 02/16/20 0431 07/26/20 0724 07/27/20 0848 07/28/20 0653 08/03/20 1329 08/06/20 0000 08/25/20 0000  08/26/20 0000 11/02/20 1101  NA 138   < > 138 139 140 140   < > 138 140 138  K 3.5   < > 3.2* 3.1* 3.1* 2.8*   < > 3.7 3.5 3.4*  CL 102   < > 105 105 105 102   < > 102 102 100  CO2 23   < > 21* 23 24 27    < > 25* 27* 25  GLUCOSE 99   < > 87 81 79 95  --   --   --  76  BUN 12   < > 11 11 10  5*   < > 8 7 6*  CREATININE 0.82   < > 0.39* 0.40* 0.42* 0.56   < > 0.3* 0.4* 0.60  CALCIUM 9.0   < > 8.3* 8.5* 8.8* 9.1   < > 8.0* 8.5* 9.5  MG 1.9   < > 1.5* 1.4* 1.6*  --   --   --   --   --   PHOS 3.0  --  2.4*  --   --   --   --   --   --   --    < > = values in this interval not displayed.   Recent Labs    07/27/20 0848 08/03/20 1329 08/26/20 0000 11/02/20 1101  AST 14* 19 18 20   ALT 14 19 9 10   ALKPHOS 74 85 101 68  BILITOT 0.9 0.7  --  0.5  PROT 5.7* 6.3*  --  7.2  ALBUMIN 2.7* 2.5* 2.5* 2.7*   Recent Labs    07/28/20 0653 08/03/20 1329 08/10/20 0000 08/25/20 0000 09/01/20 0000 11/02/20 1101  WBC 8.9 11.2*   < > 6.8 4.5 6.2  NEUTROABS 5.3 7.2  --   --   --  3.9  HGB 9.4* 10.4*   < > 9.5* 10.8* 14.7  HCT 30.6* 32.7*   < > 28* 32* 44.7  MCV 92.4 91.6  --   --   --  97.0  PLT 38* 175   < > 177 191 165   < > = values in this interval not displayed.   Lab Results  Component Value Date   TSH 2.458 02/15/2020   Lab Results  Component Value Date   HGBA1C 5.7 (H) 02/15/2020   No results found for: CHOL, HDL, LDLCALC, LDLDIRECT, TRIG, CHOLHDL  Significant Diagnostic  Results in last 30 days:  CT Chest W Contrast  Result Date: 11/02/2020 CLINICAL DATA:  Small-cell lung cancer, staging. EXAM: CT CHEST WITH CONTRAST TECHNIQUE: Multidetector CT imaging of the chest was performed during intravenous contrast administration. CONTRAST:  33mL OMNIPAQUE IOHEXOL 300 MG/ML  SOLN COMPARISON:  07/31/2020 FINDINGS: Cardiovascular: Advanced aortic and branch vessel atherosclerosis. Tortuous thoracic aorta. Mild cardiomegaly. Multivessel coronary artery atherosclerosis. No central pulmonary  embolism, on this non-dedicated study. Mediastinum/Nodes: No supraclavicular adenopathy. New right paratracheal adenopathy at 2.1 cm on 53/2. Compare 9 mm on the prior. Right hilar node of 1.4 cm is mildly enlarged increased from 1.2 cm on the prior. Lungs/Pleura: Small right pleural effusion, similar. Surgical changes, likely of right middle lobectomy. Centrilobular and paraseptal emphysema. Significant enlargement of a pleural based right upper lobe nodule. 2.5 x 2.2 cm on 28/5 versus 1.2 x 0.9 cm on the prior CT. Demonstrates direct invasion into the right-side of the mediastinum, including on 23/2. The more central right upper lobe lung lesion has markedly enlarged, including at 2.8 x 2.3 cm on 45/5. Compare 9 x 8 mm on the prior. Right lung base 5 mm nodule on 86/5 is similar. Mild limitations secondary to patient arm position and body habitus. Upper Abdomen: Right hepatic lobe well-circumscribed low-density lesions are likely cysts. Focal steatosis adjacent the falciform ligament. Normal imaged portions of the spleen, stomach, pancreas, gallbladder, adrenal glands. Left renal cortical thinning and scarring. Low-density left renal lesions are too small to characterize but likely cysts. 4 mm interpolar left renal collecting system calculus. Musculoskeletal: Osteopenia. IMPRESSION: 1. Marked disease progression, as evidenced by enlargement of right upper lobe pulmonary nodules and new/progressive thoracic adenopathy. 2. Similar small right pleural effusion. 3. Aortic atherosclerosis (ICD10-I70.0), coronary artery atherosclerosis and emphysema (ICD10-J43.9). 4. Left nephrolithiasis. 5. Mild limitations as detailed above. Electronically Signed   By: Abigail Miyamoto M.D.   On: 11/02/2020 14:23    Assessment/Plan 1. Weight loss - 12 lbs weight loss from last admission - cont monthly weights  2. Poor appetite - eating < 50 % of meals, refusing some meals and shakes - suspect due to progressive lung cancer -  will start Remeron 7.5 mg po QHS  3. Small cell lung cancer, right upper lobe ([place]) - followed by Dr. Earlie Server and palliative - appears CT chest 03/14 reports disease progression, enlargement of pulmonary nodules in right upper lobe and new thoracic adenopathy   Family/ staff Communication: Plan discussed with patient and facility nurse  Labs/tests ordered:  none

## 2020-11-05 ENCOUNTER — Encounter: Payer: Self-pay | Admitting: Internal Medicine

## 2020-11-05 ENCOUNTER — Inpatient Hospital Stay (HOSPITAL_BASED_OUTPATIENT_CLINIC_OR_DEPARTMENT_OTHER): Payer: Medicare HMO | Admitting: Internal Medicine

## 2020-11-05 ENCOUNTER — Other Ambulatory Visit: Payer: Self-pay

## 2020-11-05 ENCOUNTER — Telehealth: Payer: Self-pay | Admitting: Internal Medicine

## 2020-11-05 VITALS — BP 142/101 | HR 83 | Temp 95.6°F | Resp 17 | Ht 61.0 in | Wt 188.0 lb

## 2020-11-05 DIAGNOSIS — C3490 Malignant neoplasm of unspecified part of unspecified bronchus or lung: Secondary | ICD-10-CM

## 2020-11-05 DIAGNOSIS — Z5111 Encounter for antineoplastic chemotherapy: Secondary | ICD-10-CM

## 2020-11-05 DIAGNOSIS — C3411 Malignant neoplasm of upper lobe, right bronchus or lung: Secondary | ICD-10-CM | POA: Diagnosis not present

## 2020-11-05 NOTE — Progress Notes (Signed)
[place] [phone]   [phone]  OFFICE PROGRESS NOTE  [name], MD [address] [place] [address]  DIAGNOSIS: Limited stage, stage IIIB (T2 a, N2, M0) presented with right upper lobe lung mass in addition to mediastinal lymphadenopathy diagnosed in June 2021.  PRIOR THERAPY: Systemic chemotherapy with carboplatin for AUC of 5 from day 1 and etoposide 100 mg/M2 on days 1, 2 and 3 with Neulasta support every 3 weeks.  First dose 05/11/2020.  Status post 4 cycles.    CURRENT THERAPY: Observation.  INTERVAL HISTORY: Leah Velasquez 80 y.o. female returns to the clinic today for follow-up visit accompanied by her daughter, [name].  The patient is doing fine today with no concerning complaints except for the baseline shortness of breath.  She denied having any current chest pain, cough or hemoptysis.  She denied having any fever or chills.  She has no nausea, vomiting, diarrhea or constipation.  She has no headache or visual changes.  She has been in observation the last few months.  The patient had repeat CT scan of the chest performed recently and she is here for evaluation and discussion of her scan results.  She has occasional dizzy spells.  MEDICAL HISTORY: Past Medical History:  Diagnosis Date  . A-fib ([facility])   . Dysrhythmia   . GERD (gastroesophageal reflux disease)   . Hemorrhoids   . Hyperlipidemia   . Hypertension   . Lung cancer ([place]) 2000   RLL  . Obesity   . Persistent atrial fibrillation ([place])   . Rheumatoid arthritis ([facility])     ALLERGIES:  is allergic to morphine, morphine and related, and sulfa antibiotics.  MEDICATIONS:  Current Outpatient Medications  Medication Sig Dispense Refill  . acetaminophen (TYLENOL) 325 MG tablet Take 650 mg by mouth every 6 (six) hours as needed for mild pain.    Marland Kitchen acetaminophen (TYLENOL) 500 MG tablet Take 500 mg by mouth 2 (two) times daily as needed. For left knee  pain    . bisacodyl (DULCOLAX) 10 MG suppository Place 10 mg rectally See admin instructions. If not relieved by MOM give 10mg  Bisacodyl supp. Rectally for x1 dose in 24 hours prn constipation     . Cranberry-Vitamin C-Inulin (UTI-STAT) LIQD Take 30 mLs by mouth daily in the afternoon. For UTI prophylaxis    . diclofenac Sodium (VOLTAREN) 1 % GEL Apply 4 g topically 4 (four) times daily. To bilateral knees and ankles 350 g 1  . feeding supplement (ENSURE ENLIVE / ENSURE PLUS) LIQD Take 237 mLs by mouth 3 (three) times daily.    Marland Kitchen gabapentin (NEURONTIN) 100 MG capsule Take 100 mg by mouth at bedtime.    Marland Kitchen levocetirizine (XYZAL) 5 MG tablet Take 5 mg by mouth daily.     . metoprolol succinate (TOPROL-XL) 25 MG 24 hr tablet Take 12.5 mg by mouth at bedtime.     . mirtazapine (REMERON) 7.5 MG tablet Take 7.5 mg by mouth at bedtime.    Marland Kitchen MYRBETRIQ 25 MG TB24 tablet Take 25 mg by mouth daily.    . NON FORMULARY Magic Cup L/D Meals.    . OXYGEN Inhale 2 L into the lungs continuous.    . simvastatin (ZOCOR) 20 MG tablet Take 20 mg by mouth at bedtime.    . Sodium Phosphates (RA SALINE ENEMA RE) Place 1 Container rectally See admin instructions. If not relieved by bisacodyl supp. Give disposable  saline enema rectally x1 dose 24 hours prn constipation     . timolol (TIMOPTIC) 0.5 % ophthalmic solution Place 1 drop into both eyes at bedtime.     . vitamin B-12 1000 MCG tablet Take 1 tablet (1,000 mcg total) by mouth daily. 30 tablet 0  . warfarin (COUMADIN) 3 MG tablet Take 3 mg by mouth daily in the afternoon.     No current facility-administered medications for this visit.    SURGICAL HISTORY:  Past Surgical History:  Procedure Laterality Date  . BREAST EXCISIONAL BIOPSY Left    x 2  . BREAST EXCISIONAL BIOPSY Right   . BRONCHIAL BIOPSY  02/18/2020   Procedure: BRONCHIAL BIOPSIES;  Surgeon: Collene Gobble, MD;  Location: [facility] ENDOSCOPY;  Service: Cardiopulmonary;;  . BRONCHIAL BRUSHINGS  02/18/2020    Procedure: BRONCHIAL BRUSHINGS;  Surgeon: Collene Gobble, MD;  Location: [place];  Service: Cardiopulmonary;;  . BRONCHIAL NEEDLE ASPIRATION BIOPSY  02/18/2020   Procedure: BRONCHIAL NEEDLE ASPIRATION BIOPSIES;  Surgeon: Collene Gobble, MD;  Location: [facility];  Service: Cardiopulmonary;;  . BRONCHIAL WASHINGS  02/18/2020   Procedure: BRONCHIAL WASHINGS;  Surgeon: Collene Gobble, MD;  Location: [place];  Service: Cardiopulmonary;;  . CARDIOVERSION N/A 01/09/2018   Procedure: CARDIOVERSION;  Surgeon: Josue Hector, MD;  Location: [facility] ENDOSCOPY;  Service: Cardiovascular;  Laterality: N/A;  . CARDIOVERSION N/A 03/01/2018   Procedure: CARDIOVERSION;  Surgeon: Sueanne Margarita, MD;  Location: [facility] ENDOSCOPY;  Service: Cardiovascular;  Laterality: N/A;  . ELECTROMAGNETIC NAVIGATION BROCHOSCOPY N/A 02/18/2020   Procedure: ELECTROMAGNETIC NAVIGATION BRONCHOSCOPY;  Surgeon: Collene Gobble, MD;  Location: [place];  Service: Cardiopulmonary;  Laterality: N/A;  . KNEE SURGERY    . LUNG LOBECTOMY Right 2000   RLL removed  . VIDEO BRONCHOSCOPY N/A 02/18/2020   Procedure: VIDEO BRONCHOSCOPY WITH FLUORO;  Surgeon: Collene Gobble, MD;  Location: [facility] ENDOSCOPY;  Service: Cardiopulmonary;  Laterality: N/A;  . VIDEO BRONCHOSCOPY WITH ENDOBRONCHIAL ULTRASOUND N/A 04/22/2020   Procedure: VIDEO BRONCHOSCOPY WITH ENDOBRONCHIAL ULTRASOUND;  Surgeon: [name], MD;  Location: [place];  Service: Thoracic;  Laterality: N/A;    REVIEW OF SYSTEMS:  Constitutional: positive for fatigue Eyes: negative Ears, nose, mouth, throat, and face: negative Respiratory: positive for dyspnea on exertion Cardiovascular: negative Gastrointestinal: negative Genitourinary:negative Integument/breast: negative Hematologic/lymphatic: negative Musculoskeletal:negative Neurological: positive for dizziness Behavioral/Psych: negative Endocrine: negative Allergic/Immunologic: negative   PHYSICAL EXAMINATION:  General appearance: alert, cooperative, fatigued and no distress Head: Normocephalic, without obvious abnormality, atraumatic Neck: no adenopathy, no JVD, supple, symmetrical, trachea midline and thyroid not enlarged, symmetric, no tenderness/mass/nodules Lymph nodes: Cervical, supraclavicular, and axillary nodes normal. Resp: clear to auscultation bilaterally Back: symmetric, no curvature. ROM normal. No CVA tenderness. Cardio: regular rate and rhythm, S1, S2 normal, no murmur, click, rub or gallop GI: soft, non-tender; bowel sounds normal; no masses,  no organomegaly Extremities: extremities normal, atraumatic, no cyanosis or edema Neurologic: Alert and oriented X 3, normal strength and tone. Normal symmetric reflexes. Normal coordination and gait  ECOG PERFORMANCE STATUS: 1 - Symptomatic but completely ambulatory  Blood pressure (!) 142/101, pulse 83, temperature (!) 95.6 F (35.3 C), temperature source Tympanic, resp. rate 17, height 5\' 1"  (1.549 m), weight 188 lb (85.3 kg), SpO2 98 %.  LABORATORY DATA: Lab Results  Component Value Date   WBC 6.2 11/02/2020   HGB 14.7 11/02/2020   HCT 44.7 11/02/2020   MCV 97.0 11/02/2020   PLT 165 11/02/2020  Chemistry      Component Value Date/Time   NA 138 11/02/2020 1101   NA 140 08/26/2020 0000   K 3.4 (L) 11/02/2020 1101   CL 100 11/02/2020 1101   CO2 25 11/02/2020 1101   BUN 6 (L) 11/02/2020 1101   BUN 7 08/26/2020 0000   CREATININE 0.60 11/02/2020 1101   GLU 97 08/26/2020 0000      Component Value Date/Time   CALCIUM 9.5 11/02/2020 1101   ALKPHOS 68 11/02/2020 1101   AST 20 11/02/2020 1101   ALT 10 11/02/2020 1101   BILITOT 0.5 11/02/2020 1101       RADIOGRAPHIC STUDIES: CT Chest W Contrast  Result Date: 11/02/2020 CLINICAL DATA:  Small-cell lung cancer, staging. EXAM: CT CHEST WITH CONTRAST TECHNIQUE: Multidetector CT imaging of the chest was performed during intravenous contrast administration. CONTRAST:  79mL  OMNIPAQUE IOHEXOL 300 MG/ML  SOLN COMPARISON:  07/31/2020 FINDINGS: Cardiovascular: Advanced aortic and branch vessel atherosclerosis. Tortuous thoracic aorta. Mild cardiomegaly. Multivessel coronary artery atherosclerosis. No central pulmonary embolism, on this non-dedicated study. Mediastinum/Nodes: No supraclavicular adenopathy. New right paratracheal adenopathy at 2.1 cm on 53/2. Compare 9 mm on the prior. Right hilar node of 1.4 cm is mildly enlarged increased from 1.2 cm on the prior. Lungs/Pleura: Small right pleural effusion, similar. Surgical changes, likely of right middle lobectomy. Centrilobular and paraseptal emphysema. Significant enlargement of a pleural based right upper lobe nodule. 2.5 x 2.2 cm on 28/5 versus 1.2 x 0.9 cm on the prior CT. Demonstrates direct invasion into the right-side of the mediastinum, including on 23/2. The more central right upper lobe lung lesion has markedly enlarged, including at 2.8 x 2.3 cm on 45/5. Compare 9 x 8 mm on the prior. Right lung base 5 mm nodule on 86/5 is similar. Mild limitations secondary to patient arm position and body habitus. Upper Abdomen: Right hepatic lobe well-circumscribed low-density lesions are likely cysts. Focal steatosis adjacent the falciform ligament. Normal imaged portions of the spleen, stomach, pancreas, gallbladder, adrenal glands. Left renal cortical thinning and scarring. Low-density left renal lesions are too small to characterize but likely cysts. 4 mm interpolar left renal collecting system calculus. Musculoskeletal: Osteopenia. IMPRESSION: 1. Marked disease progression, as evidenced by enlargement of right upper lobe pulmonary nodules and new/progressive thoracic adenopathy. 2. Similar small right pleural effusion. 3. Aortic atherosclerosis (ICD10-I70.0), coronary artery atherosclerosis and emphysema (ICD10-J43.9). 4. Left nephrolithiasis. 5. Mild limitations as detailed above. Electronically Signed   By: Abigail Miyamoto M.D.   On:  11/02/2020 14:23    ASSESSMENT AND PLAN: This is a very pleasant 80 years old white female with a limited stage, stage IIIb (T2a, N2, M0) small cell lung cancer presented with right upper lobe lung mass in addition to mediastinal lymphadenopathy diagnosed in June 2021. The patient is currently undergoing treatment with systemic chemotherapy with carboplatin for AUC of 5 on day 1 and 2 etoposide 100 mg/M2 on days 1, 2 and 3 with Neulasta support.  Status post 4 cycles. She tolerated her treatment well except for the pancytopenia. The patient is currently on observation and feeling fine except for the baseline shortness of breath and fatigue.  She also complains of dizzy spells recently. She had repeat CT scan of the chest performed recently.  I personally and independently reviewed the scan images and discussed the results with the patient and her daughter [name] and Neoma Laming who is available by phone. Her scan showed marked disease progression by enlargement of the right upper lobe  pulmonary nodules as well as new and progressive thoracic adenopathy. I recommended for the patient to see Dr. Sondra Come for consideration of palliative radiotherapy to the progressive disease.  The patient has a rough time with the previous chemotherapy and I will hold on starting chemotherapy for now unless she has any further disease progression. For the dizziness, I will order MRI of the brain to rule out brain metastasis. The patient will come back for follow-up visit in 3 months for evaluation and repeat CT scan of the chest for restaging of her disease. She was advised to call immediately if she has any other concerning symptoms in the interval. The patient voices understanding of current disease status and treatment options and is in agreement with the current care plan.  All questions were answered. The patient knows to call the clinic with any problems, questions or concerns. We can certainly see the patient much  sooner if necessary.  Disclaimer: This note was dictated with voice recognition software. Similar sounding words can inadvertently be transcribed and may not be corrected upon review.

## 2020-11-05 NOTE — Telephone Encounter (Signed)
Scheduled per 03/17 los, patient is notified.

## 2020-11-06 ENCOUNTER — Telehealth: Payer: Self-pay | Admitting: Radiation Oncology

## 2020-11-06 NOTE — Telephone Encounter (Signed)
Patient needed to r/s FUN w/Dr. Sondra Come and CT SIM appt at the end of March to April 6th due to brain MRI and transportation issues.

## 2020-11-18 ENCOUNTER — Other Ambulatory Visit: Payer: Self-pay

## 2020-11-18 ENCOUNTER — Ambulatory Visit: Payer: Medicare HMO | Admitting: Radiation Oncology

## 2020-11-18 ENCOUNTER — Ambulatory Visit: Payer: Medicare HMO

## 2020-11-18 ENCOUNTER — Ambulatory Visit (HOSPITAL_COMMUNITY)
Admission: RE | Admit: 2020-11-18 | Discharge: 2020-11-18 | Disposition: A | Discharge status: Home / Self Care | Payer: Medicare HMO | Source: Ambulatory Visit | Attending: Internal Medicine | Admitting: Internal Medicine

## 2020-11-18 DIAGNOSIS — C3490 Malignant neoplasm of unspecified part of unspecified bronchus or lung: Secondary | ICD-10-CM | POA: Insufficient documentation

## 2020-11-18 IMAGING — MR MR HEAD WO/W CM
14 series · 48 of 48 positions shown · IV contrast (gadavist)
Comparison: Brain MRI [DATE].
COMPARISON: Brain MRI [DATE].

Addendum:
CLINICAL DATA: 79-year-old female restaging lung cancer. Systemic
disease progression on recent CT Chest, Abdomen, and Pelvis. History
of cerebral ischemic disease, multiple chronic infarcts.

EXAM:
MRI HEAD WITHOUT AND WITH CONTRAST
TECHNIQUE: Multiplanar, multiecho pulse sequences of the brain and surrounding
structures were obtained without and with intravenous contrast.
CONTRAST:  8mL GADAVIST GADOBUTROL 1 MMOL/ML IV SOLN

[Series 5: DWI · axial · 3.0mm · 1.36mm/px · z∈[-57,+83]mm · 5 of 96 slices shown (1 of 2)]
[im 1/96]
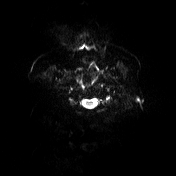
[im 24/96]
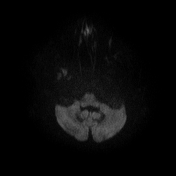
[im 48/96]
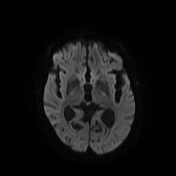
[im 72/96]
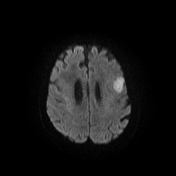
[im 96/96]
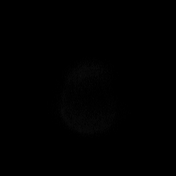

[Series 6: DWI · axial · 3.0mm · 1.36mm/px · z∈[-57,+80]mm · 2 of 47 slices shown (2 of 2)]
[im 1/47]
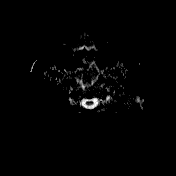
[im 47/47]
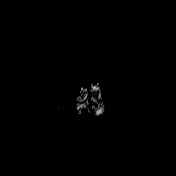

[Series 7: T1 · sagittal · 5.0mm · 0.75mm/px · 1 of 24 slices shown (1 of 2)]
[im 1/24]
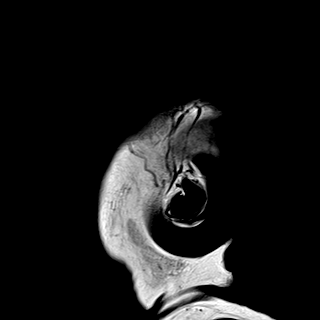

[Series 8: T2 · axial · 5.0mm · 0.62mm/px · z∈[-64,+85]mm · 2 of 24 slices shown]
[im 1/24]
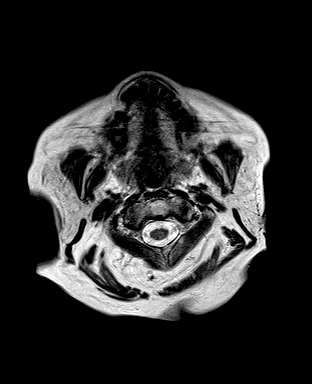
[im 24/24]
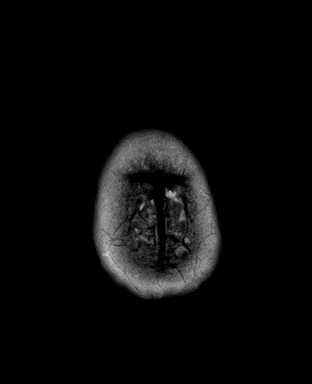

[Series 9: mip_images(sw) · axial · 24.0mm · 0.75mm/px · z∈[-49,+70]mm · 3 of 41 slices shown]
[im 1/41]
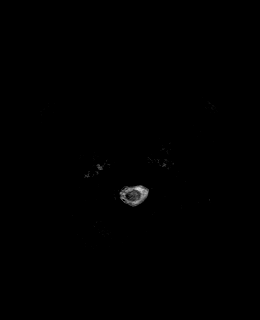
[im 21/41]
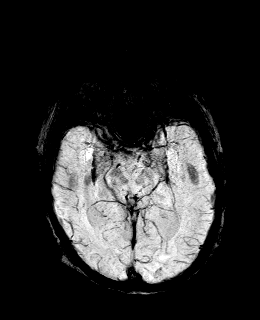
[im 41/41]
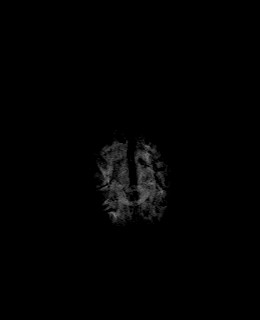

[Series 10: swi_images · axial · 3.0mm · 0.75mm/px · z∈[-59,+81]mm · 3 of 48 slices shown]
[im 1/48]
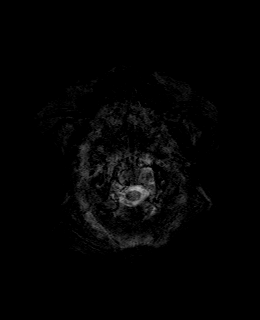
[im 24/48]
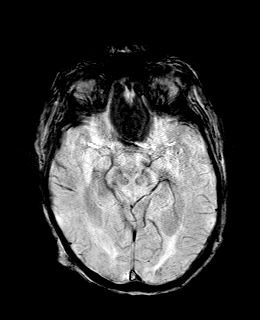
[im 48/48]
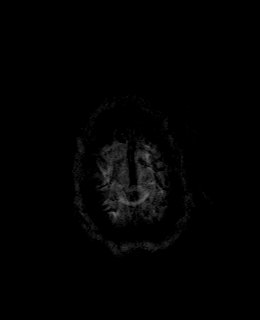

[Series 11: FLAIR · axial · 3.0mm · 0.75mm/px · z∈[-61,+82]mm · 3 of 49 slices shown]
[im 1/49]
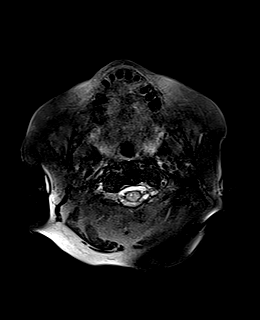
[im 25/49]
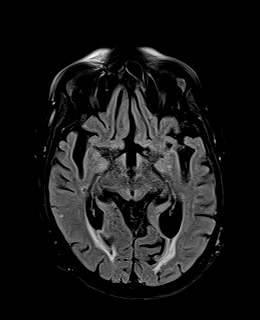
[im 49/49]
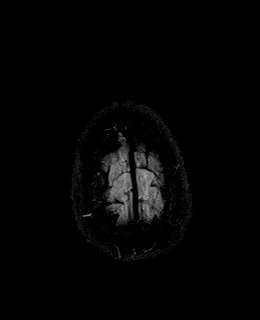

[Series 12: T1 · axial · 1.0mm · 0.94mm/px · z∈[-52,+90]mm · 9 of 144 slices shown (2 of 2)]
[im 1/144]
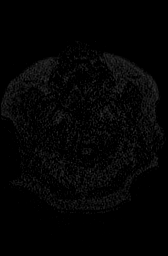
[im 18/144]
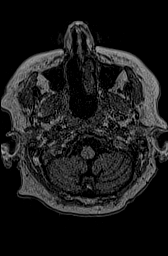
[im 36/144]
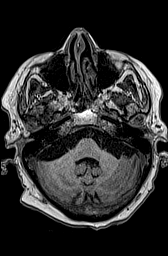
[im 54/144]
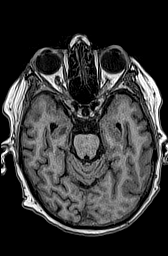
[im 72/144]
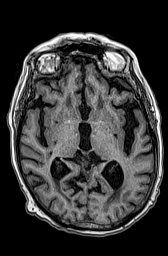
[im 90/144]
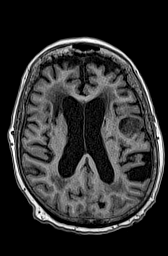
[im 108/144]
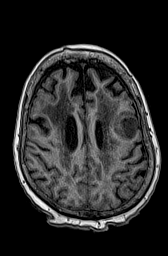
[im 126/144]
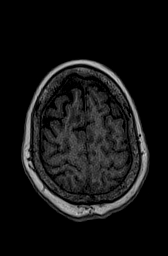
[im 144/144]
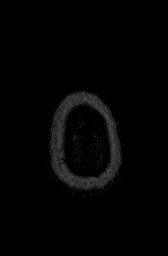

[Series 13: cor dwi_tracew · coronal · 5.0mm · 1.53mm/px · 3 of 48 slices shown]
[im 1/48]
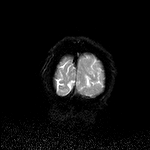
[im 24/48]
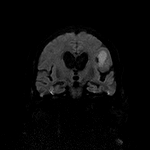
[im 48/48]
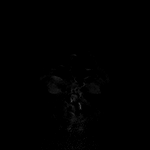

[Series 14: cor dwi_adc · coronal · 5.0mm · 1.53mm/px · 2 of 24 slices shown]
[im 1/24]
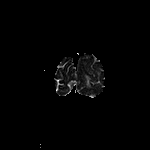
[im 24/24]
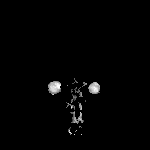

[Series 15: T2 post-contrast · coronal · 5.0mm · 0.57mm/px · 2 of 25 slices shown]
[im 1/25]
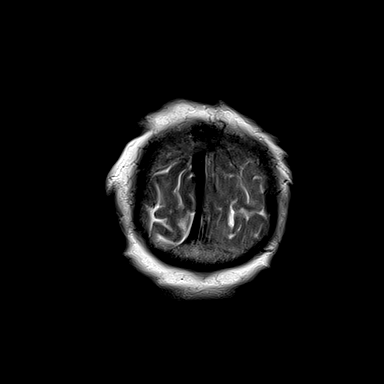
[im 25/25]
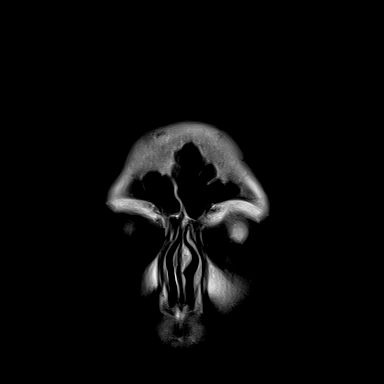

[Series 16: T1 post-contrast · axial · 1.0mm · 0.94mm/px · z∈[-52,+90]mm · 9 of 144 slices shown (1 of 3)]
[im 1/144]
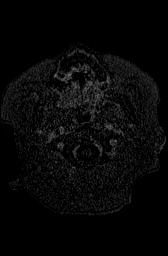
[im 18/144]
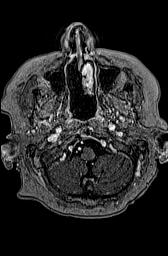
[im 36/144]
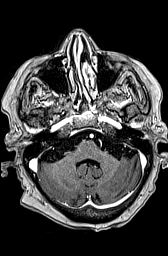
[im 54/144]
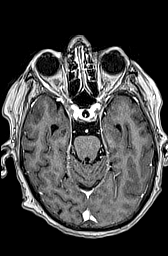
[im 72/144]
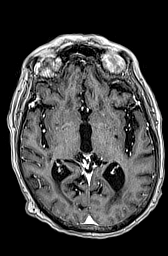
[im 90/144]
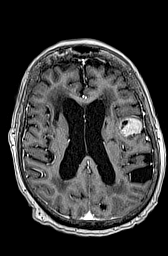
[im 108/144]
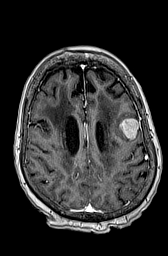
[im 126/144]
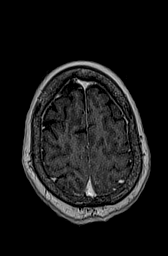
[im 144/144]
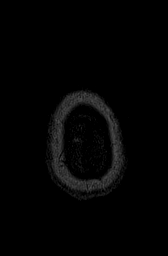

[Series 17: T1 post-contrast · coronal · 5.0mm · 0.43mm/px · 2 of 25 slices shown (2 of 3)]
[im 1/25]
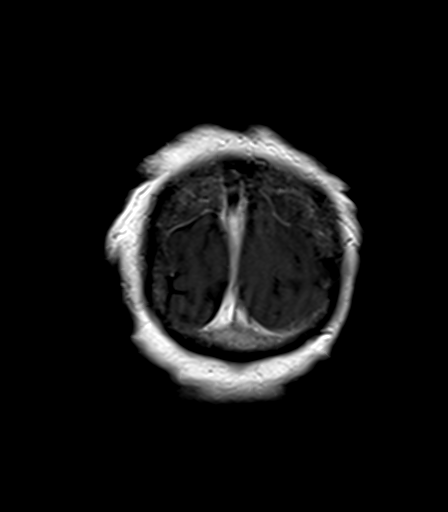
[im 25/25]
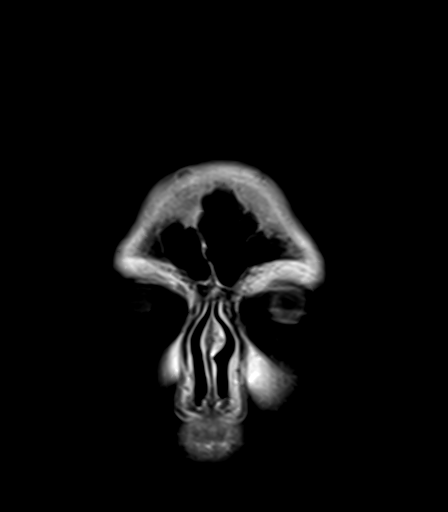

[Series 18: T1 post-contrast · sagittal · 5.0mm · 0.75mm/px · 2 of 24 slices shown (3 of 3)]
[im 1/24]
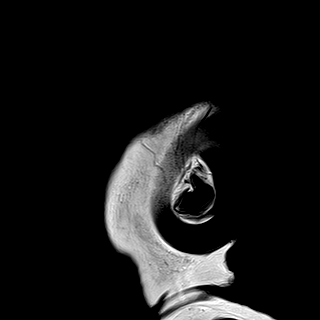
[im 24/24]
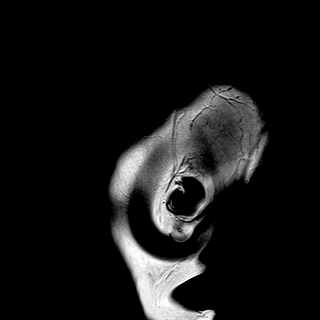

[48 of 48 positions shown; findings below may reference images not displayed]

FINDINGS: Brain: New solid and cystic enhancing mass measuring 30 mm diameter
at the left frontal operculum (series 17, image 14). Associated
abnormal diffusion on the basis of hypercellularity. Medial cystic
or necrotic components. Regional vasogenic edema although mild
regional mass effect given the lesion size (series 11, image 35).
Microhemorrhage within the lesion.

Superimposed too small cerebellar foci of restricted diffusion but
masslike enhancement on series 16 images 51 (the 4 mm) and image 39
(5 mm), both near midline. Minimal associated cerebellar edema.

No other abnormal intracranial enhancement or diffusion. No dural
thickening.

No restricted diffusion suggestive of acute infarction. Extensive
chronic white matter disease and areas of chronic cortical
encephalomalacia in the right superior frontal and left parietal
lobes. Chronic lacunar infarcts in the bilateral deep gray matter
nuclei appear stable from last year. No other chronic cerebral blood
products. No midline shift, ventriculomegaly, extra-axial collection
or acute intracranial hemorrhage. Cervicomedullary junction and
pituitary are within normal limits.

Vascular: Major intracranial vascular flow voids are stable since
last year. The major dural venous sinuses are enhancing and appear
to be patent.

Skull and upper cervical spine: Visible bone marrow signal remains
within normal limits. Cervical spine degeneration with mild
multilevel spondylolisthesis again noted.

Sinuses/Orbits: Stable, negative.

Other: Trace mastoid fluid now. Negative visible nasopharynx.
Visible scalp and face appear negative.
IMPRESSION: 1. New metastatic disease to the brain.
Dominant 3 cm partially cystic, enhancing mass of the left frontal
operculum has some associated blood products, but only mild edema
and no significant mass effect.
Two subcentimeter cerebellar metastases. All three demonstrate
abnormal diffusion which is likely on the basis of hypercellularity.

2. Underlying advanced chronic ischemic disease is stable since last
year.

ADDENDUM:
Study discussed by telephone with Dr. VEEDIANAND on [DATE]
at [BC] hours.

*** End of Addendum ***
FINDINGS: Brain: New solid and cystic enhancing mass measuring 30 mm diameter
at the left frontal operculum (series 17, image 14). Associated
abnormal diffusion on the basis of hypercellularity. Medial cystic
or necrotic components. Regional vasogenic edema although mild
regional mass effect given the lesion size (series 11, image 35).
Microhemorrhage within the lesion.

Superimposed too small cerebellar foci of restricted diffusion but
masslike enhancement on series 16 images 51 (the 4 mm) and image 39
(5 mm), both near midline. Minimal associated cerebellar edema.

No other abnormal intracranial enhancement or diffusion. No dural
thickening.

No restricted diffusion suggestive of acute infarction. Extensive
chronic white matter disease and areas of chronic cortical
encephalomalacia in the right superior frontal and left parietal
lobes. Chronic lacunar infarcts in the bilateral deep gray matter
nuclei appear stable from last year. No other chronic cerebral blood
products. No midline shift, ventriculomegaly, extra-axial collection
or acute intracranial hemorrhage. Cervicomedullary junction and
pituitary are within normal limits.

Vascular: Major intracranial vascular flow voids are stable since
last year. The major dural venous sinuses are enhancing and appear
to be patent.

Skull and upper cervical spine: Visible bone marrow signal remains
within normal limits. Cervical spine degeneration with mild
multilevel spondylolisthesis again noted.

Sinuses/Orbits: Stable, negative.

Other: Trace mastoid fluid now. Negative visible nasopharynx.
Visible scalp and face appear negative.
IMPRESSION: 1. New metastatic disease to the brain.
Dominant 3 cm partially cystic, enhancing mass of the left frontal
operculum has some associated blood products, but only mild edema
and no significant mass effect.
Two subcentimeter cerebellar metastases. All three demonstrate
abnormal diffusion which is likely on the basis of hypercellularity.

2. Underlying advanced chronic ischemic disease is stable since last
year.

## 2020-11-18 MED ORDER — GADOBUTROL 1 MMOL/ML IV SOLN
8.0000 mL | Freq: Once | INTRAVENOUS | Status: AC | PRN
  Administered 2020-11-18: 8 mL via INTRAVENOUS

## 2020-11-18 NOTE — Progress Notes (Incomplete)
Location/Histology of Brain Tumor: ***  Patient presented with symptoms of:  ***  Past or anticipated interventions, if any, per neurosurgery: {:18581}  Past or anticipated interventions, if any, per medical oncology: {:18581}  Dose of Decadron, if applicable: {:50539}  Recent neurologic symptoms, if any:   Seizures: {:18581}  Headaches: {:18581}  Nausea: {:18581}  Dizziness/ataxia: {:18581}  Difficulty with hand coordination: {:18581}  Focal numbness/weakness: {:18581}  Visual deficits/changes: {:18581}  Confusion/Memory deficits: {:18581}  Painful bone metastases at present, if any: {:18581}      Thoracic Location of Tumor / Histology: ***  Patient presented {numbers 1-12:19994} months ago with symptoms of: ***  Biopsies of *** (if applicable) revealed: ***  Tobacco/Marijuana/Snuff/ETOH use: {:18581}  Past/Anticipated interventions by cardiothoracic surgery, if any: {:18581}  Past/Anticipated interventions by medical oncology, if any: {:18581}  Signs/Symptoms  Weight changes, if any: {:18581}  Respiratory complaints, if any: {:18581}  Hemoptysis, if any: {:18581}  Pain issues, if any:  {:18581}  SAFETY ISSUES:  Prior radiation? {:18581}  Pacemaker/ICD? {:18581}   Possible current pregnancy?{:18581}  Is the patient on methotrexate? {:18581}  Current Complaints / other details:  ***

## 2020-11-19 ENCOUNTER — Ambulatory Visit: Payer: Medicare HMO | Admitting: Radiation Oncology

## 2020-11-23 NOTE — Progress Notes (Signed)
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  Past or anticipated interventions, if any, per neurosurgery: no  Past or anticipated interventions, if any, per medical oncology: per Dr Julien Nordmann   Dose of Decadron, if applicable: no  Recent neurologic symptoms, if any:   Seizures: no  Headaches: yes  Nausea: no  Dizziness/ataxia: yes, dizziness  Difficulty with hand coordination: no  Focal numbness/weakness: yes, in hands  Visual deficits/changes: no  Confusion/Memory deficits: yes, confusion and memory deficits.  Recent respiratory symptoms, if any:  Pain no  Fatigue yes  Cough no  Hemoptysis no  Voice is low  Shortness of breath no  Pain/difficulty swallowing or feels like there is a lump in the throat: no  Taking carafate: no  Skin irritation/peeling/itching/rash/moisture/open areas: no  Weight changes: yes, approximately 70 pound weight loss over 9 months  Appetite: poor appetite  Difficulty sleeping: no   Painful bone metastases at present, if any: no  SAFETY ISSUES:  Prior radiation? no  Pacemaker/ICD? no  Possible current pregnancy? no  Is the patient on methotrexate? no  Additional Complaints / other details: no  Vitals:   11/25/20 1357  BP: 138/88  Pulse: 78  Resp: 20  Temp: 97.8 F (36.6 C)  SpO2: 96%  Height: 5\' 1"  (1.549 m)

## 2020-11-25 ENCOUNTER — Ambulatory Visit
Admission: RE | Admit: 2020-11-25 | Discharge: 2020-11-25 | Disposition: A | Discharge status: Home / Self Care | Payer: Medicare HMO | Source: Ambulatory Visit | Attending: Radiation Oncology | Admitting: Radiation Oncology

## 2020-11-25 ENCOUNTER — Ambulatory Visit: Payer: Medicare HMO | Admitting: Radiation Oncology

## 2020-11-25 ENCOUNTER — Other Ambulatory Visit: Payer: Self-pay

## 2020-11-25 ENCOUNTER — Encounter: Payer: Self-pay | Admitting: Radiation Oncology

## 2020-11-25 VITALS — BP 138/88 | HR 78 | Temp 97.8°F | Resp 20 | Ht 61.0 in

## 2020-11-25 DIAGNOSIS — M8580 Other specified disorders of bone density and structure, unspecified site: Secondary | ICD-10-CM | POA: Diagnosis not present

## 2020-11-25 DIAGNOSIS — J432 Centrilobular emphysema: Secondary | ICD-10-CM | POA: Diagnosis not present

## 2020-11-25 DIAGNOSIS — I700 Atherosclerosis of aorta: Secondary | ICD-10-CM | POA: Insufficient documentation

## 2020-11-25 DIAGNOSIS — C3411 Malignant neoplasm of upper lobe, right bronchus or lung: Secondary | ICD-10-CM

## 2020-11-25 DIAGNOSIS — J90 Pleural effusion, not elsewhere classified: Secondary | ICD-10-CM | POA: Diagnosis not present

## 2020-11-25 DIAGNOSIS — R590 Localized enlarged lymph nodes: Secondary | ICD-10-CM | POA: Diagnosis not present

## 2020-11-25 DIAGNOSIS — C3490 Malignant neoplasm of unspecified part of unspecified bronchus or lung: Secondary | ICD-10-CM | POA: Insufficient documentation

## 2020-11-25 DIAGNOSIS — C7931 Secondary malignant neoplasm of brain: Secondary | ICD-10-CM | POA: Diagnosis present

## 2020-11-25 DIAGNOSIS — Z7901 Long term (current) use of anticoagulants: Secondary | ICD-10-CM | POA: Diagnosis not present

## 2020-11-25 DIAGNOSIS — Z79899 Other long term (current) drug therapy: Secondary | ICD-10-CM | POA: Insufficient documentation

## 2020-11-25 DIAGNOSIS — I2510 Atherosclerotic heart disease of native coronary artery without angina pectoris: Secondary | ICD-10-CM | POA: Insufficient documentation

## 2020-11-25 DIAGNOSIS — N200 Calculus of kidney: Secondary | ICD-10-CM | POA: Diagnosis not present

## 2020-11-25 DIAGNOSIS — Z510 Encounter for antineoplastic radiation therapy: Secondary | ICD-10-CM | POA: Insufficient documentation

## 2020-11-25 NOTE — Progress Notes (Signed)
See Dr Clabe Seal consult for nursing evaluation.

## 2020-11-25 NOTE — Progress Notes (Signed)
Radiation Oncology         [phone]) [phone] ________________________________  Name: Leah Velasquez MRN: [id number]  Date: 11/25/2020  DOB: 1941-02-10  Re-Evaluation Note  CC: [name], MD  Curt Bears, MD    ICD-10-CM   1. Small cell lung cancer, right upper lobe (HCC)  C34.11   2. Lung cancer metastatic to brain (Advance)  C34.90    C79.31   3. 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 She was seen in the multidisciplinary lung clinic on 02/27/2020, at which time she was to undergo a PET scan to complete staging work-up.  PET scan on 03/10/2020 showed FDG-avid lesions within the para-mediastinal aspect of the right apex and right upper lobe concerning for primary bronchogenic carcinoma. There were also signs of hypermetabolic right hilar and right paratracheal nodal metastasis. Additionally, there was a single sub-centimeter lymph node within the right lower quadrant small bowel mesentery that was FDG-avid and of uncertain clinical significance; would be an unusual focus of metastasis given absence of disease elsewhere in the abdomen and pelvis. Finally, there was a small focus of moderate uptake localizing to the sacrococcygeal junction that was favored to represent arthropathic changes. There was no additional foci of increased tracer activity identified within the imaged portions of the axial and appendicular skeleton.  The patient underwent a bronchoscopy with endobronchial ultrasound on 04/22/2020 under the care of Dr. Roxan Hockey. Cytology from the procedure revealed malignant cells in the fine needle aspiration of lymph node 4R and the right upper lobe brushing. Pathology from the procedure revealed small cell carcinoma of the right upper lobe.  Chest CT scan on  06/19/2020 showed an interval decrease in size of a suprahilar nodule of the right upper lobe and nodular soft tissue involving the pleura at the right apex. There was also an interval decrease in size of the enlarged pretracheal lymph nodes. Findings were consistent with treatment response. Finally, there was interval decrease in the irregular nodularity of the peripheral right upper lobe that likely reflected resolving non-specific infection/inflammation, and new small right pleural effusion that was non-specific. There was no obvious pleural nodularity or soft tissue.  Chest CT scan on 07/31/2020 demonstrated regression of disease both in terms of suprahilar nodule in the right upper lobe and pleural based nodule in the medial aspect of the right upper hemithorax. There were no new suspicious findings otherwise. Small right pleural effusion was stable.  Chest CT scan on 11/02/2020 showed marked disease progression as evidenced by enlargement of right upper lobe pulmonary nodules and new/progressive thoracic adenopathy.   The patient was last seen by Dr. Julien Nordmann on 11/05/2020. She was referred back to radiation oncology for consideration of palliative radiotherapy to the progressive disease.   MRI of brain on 11/18/2020 showed new metastatic disease to the brain. The dominant 3 cm partially cystic, enhancing mass of the left frontal operculum had some associated blood products, but only mild edema and no significant mass effect. Additionally, there were two sub-centimeter cerebellar metastases. All three demonstrated abnormal diffusion, which was likely on the basis of hypercellularity.   PRIOR THERAPY: Systemic chemotherapy with carboplatin for AUC of 5 from day 1 and etoposide 100 mg/M2 on days 1, 2 and 3 with Neulasta support every 3  weeks.  First dose 05/11/2020.  Status post 4 cycles.   Due to the patient's performance status she was not a candidate for concomitant chest radiation therapy during  her chemotherapy.  On review of systems, the patient reports some headaches along the top portion of her scalp. She denies double vision and any other symptoms.  Daughter-in-law who is present today reports patient is noted to be increasingly confused and has problems with word finding.  Patient is been unable to walk for some time prior to this new issue.  She is in a wheelchair today and a Harrel Lemon lift is used for transportation in the nursing home [place].  The patient denies any pain within the chest significant cough or hemoptysis.  She denies any swallowing difficulties.   Allergies:  is allergic to morphine, morphine and related, and sulfa antibiotics.  Meds: Current Outpatient Medications  Medication Sig Dispense Refill  . acetaminophen (TYLENOL) 325 MG tablet Take 650 mg by mouth every 6 (six) hours as needed for mild pain.    Marland Kitchen acetaminophen (TYLENOL) 500 MG tablet Take 500 mg by mouth 2 (two) times daily as needed. For left knee pain    . bisacodyl (DULCOLAX) 10 MG suppository Place 10 mg rectally See admin instructions. If not relieved by MOM give 10mg  Bisacodyl supp. Rectally for x1 dose in 24 hours prn constipation     . diclofenac Sodium (VOLTAREN) 1 % GEL Apply 4 g topically 4 (four) times daily. To bilateral knees and ankles 350 g 1  . feeding supplement (ENSURE ENLIVE / ENSURE PLUS) LIQD Take 237 mLs by mouth 3 (three) times daily.    Marland Kitchen gabapentin (NEURONTIN) 100 MG capsule Take 100 mg by mouth at bedtime.    Marland Kitchen levocetirizine (XYZAL) 5 MG tablet Take 5 mg by mouth daily.     . metoprolol succinate (TOPROL-XL) 25 MG 24 hr tablet Take 12.5 mg by mouth at bedtime.     . mirtazapine (REMERON) 7.5 MG tablet Take 7.5 mg by mouth at bedtime.    Marland Kitchen MYRBETRIQ 25 MG TB24 tablet Take 25 mg by mouth daily.    . NON FORMULARY Magic Cup L/D Meals.    . simvastatin (ZOCOR) 20 MG tablet Take 20 mg by mouth at bedtime.    . Sodium Phosphates (RA SALINE ENEMA RE) Place 1 Container rectally See admin  instructions. If not relieved by bisacodyl supp. Give disposable saline enema rectally x1 dose 24 hours prn constipation     . timolol (TIMOPTIC) 0.5 % ophthalmic solution Place 1 drop into both eyes at bedtime.     . vitamin B-12 1000 MCG tablet Take 1 tablet (1,000 mcg total) by mouth daily. 30 tablet 0  . warfarin (COUMADIN) 3 MG tablet Take 3 mg by mouth daily in the afternoon.    . Cranberry-Vitamin C-Inulin (UTI-STAT) LIQD Take 30 mLs by mouth daily in the afternoon. For UTI prophylaxis (Patient not taking: Reported on 11/25/2020)    . OXYGEN Inhale 2 L into the lungs continuous. (Patient not taking: Reported on 11/25/2020)     No current facility-administered medications for this encounter.    Physical Findings: The patient is in no acute distress.  Patient exhibits some confusion and word finding issues.  She is quite pleasant today and smiles.  Examination of the oral cavity reveals no secondary infection.  height is 5\' 1"  (1.549 m). Her temperature is 97.8 F (36.6 C). Her blood pressure is 138/88 and her pulse is 78. Her  respiration is 20 and oxygen saturation is 96%.   Lungs are clear to auscultation bilaterally. Heart has regular rate and rhythm. No palpable cervical, supraclavicular, or axillary adenopathy. Abdomen soft, non-tender, normal bowel sounds.  Patient is able to lift her arms against gravity above her head.  She however is unable to lift her legs out of the wheelchair.  She is able to wiggle her toes.  Lab Findings: Lab Results  Component Value Date   WBC 6.2 11/02/2020   HGB 14.7 11/02/2020   HCT 44.7 11/02/2020   MCV 97.0 11/02/2020   PLT 165 11/02/2020    Radiographic Findings: CT Chest W Contrast  Result Date: 11/02/2020 CLINICAL DATA:  Small-cell lung cancer, staging. EXAM: CT CHEST WITH CONTRAST TECHNIQUE: Multidetector CT imaging of the chest was performed during intravenous contrast administration. CONTRAST:  32mL OMNIPAQUE IOHEXOL 300 MG/ML  SOLN COMPARISON:   07/31/2020 FINDINGS: Cardiovascular: Advanced aortic and branch vessel atherosclerosis. Tortuous thoracic aorta. Mild cardiomegaly. Multivessel coronary artery atherosclerosis. No central pulmonary embolism, on this non-dedicated study. Mediastinum/Nodes: No supraclavicular adenopathy. New right paratracheal adenopathy at 2.1 cm on 53/2. Compare 9 mm on the prior. Right hilar node of 1.4 cm is mildly enlarged increased from 1.2 cm on the prior. Lungs/Pleura: Small right pleural effusion, similar. Surgical changes, likely of right middle lobectomy. Centrilobular and paraseptal emphysema. Significant enlargement of a pleural based right upper lobe nodule. 2.5 x 2.2 cm on 28/5 versus 1.2 x 0.9 cm on the prior CT. Demonstrates direct invasion into the right-side of the mediastinum, including on 23/2. The more central right upper lobe lung lesion has markedly enlarged, including at 2.8 x 2.3 cm on 45/5. Compare 9 x 8 mm on the prior. Right lung base 5 mm nodule on 86/5 is similar. Mild limitations secondary to patient arm position and body habitus. Upper Abdomen: Right hepatic lobe well-circumscribed low-density lesions are likely cysts. Focal steatosis adjacent the falciform ligament. Normal imaged portions of the spleen, stomach, pancreas, gallbladder, adrenal glands. Left renal cortical thinning and scarring. Low-density left renal lesions are too small to characterize but likely cysts. 4 mm interpolar left renal collecting system calculus. Musculoskeletal: Osteopenia. IMPRESSION: 1. Marked disease progression, as evidenced by enlargement of right upper lobe pulmonary nodules and new/progressive thoracic adenopathy. 2. Similar small right pleural effusion. 3. Aortic atherosclerosis (ICD10-I70.0), coronary artery atherosclerosis and emphysema (ICD10-J43.9). 4. Left nephrolithiasis. 5. Mild limitations as detailed above. Electronically Signed   By: Abigail Miyamoto M.D.   On: 11/02/2020 14:23   MR BRAIN W WO  CONTRAST  Addendum Date: 11/18/2020   ADDENDUM REPORT: 11/18/2020 09:34 ADDENDUM: Study discussed by telephone with Dr. Curt Bears on 11/18/2020 at 0913 hours. Electronically Signed   By: Genevie Ann M.D.   On: 11/18/2020 09:34   Result Date: 11/18/2020 CLINICAL DATA:  80 year old female restaging lung cancer. Systemic disease progression on recent CT Chest, Abdomen, and Pelvis. History of cerebral ischemic disease, multiple chronic infarcts. EXAM: MRI HEAD WITHOUT AND WITH CONTRAST TECHNIQUE: Multiplanar, multiecho pulse sequences of the brain and surrounding structures were obtained without and with intravenous contrast. CONTRAST:  16mL GADAVIST GADOBUTROL 1 MMOL/ML IV SOLN COMPARISON:  Brain MRI 02/15/2020. FINDINGS: Brain: New solid and cystic enhancing mass measuring 30 mm diameter at the left frontal operculum (series 17, image 14). Associated abnormal diffusion on the basis of hypercellularity. Medial cystic or necrotic components. Regional vasogenic edema although mild regional mass effect given the lesion size (series 11, image 35). Microhemorrhage within the lesion. Superimposed  too small cerebellar foci of restricted diffusion but masslike enhancement on series 16 images 51 (the 4 mm) and image 39 (5 mm), both near midline. Minimal associated cerebellar edema. No other abnormal intracranial enhancement or diffusion. No dural thickening. No restricted diffusion suggestive of acute infarction. Extensive chronic white matter disease and areas of chronic cortical encephalomalacia in the right superior frontal and left parietal lobes. Chronic lacunar infarcts in the bilateral deep gray matter nuclei appear stable from last year. No other chronic cerebral blood products. No midline shift, ventriculomegaly, extra-axial collection or acute intracranial hemorrhage. Cervicomedullary junction and pituitary are within normal limits. Vascular: Major intracranial vascular flow voids are stable since last year. The  major dural venous sinuses are enhancing and appear to be patent. Skull and upper cervical spine: Visible bone marrow signal remains within normal limits. Cervical spine degeneration with mild multilevel spondylolisthesis again noted. Sinuses/Orbits: Stable, negative. Other: Trace mastoid fluid now. Negative visible nasopharynx. Visible scalp and face appear negative. IMPRESSION: 1. New metastatic disease to the brain. Dominant 3 cm partially cystic, enhancing mass of the left frontal operculum has some associated blood products, but only mild edema and no significant mass effect. Two subcentimeter cerebellar metastases. All three demonstrate abnormal diffusion which is likely on the basis of hypercellularity. 2. Underlying advanced chronic ischemic disease is stable since last year. Electronically Signed: By: Genevie Ann M.D. 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  Recent chest CT scan the patient has progression in the right upper's chest and would be a candidate for palliative radiation therapy to this region.  Patient's recent brain MRI does thoroughly show a dominant enhancing mass in the left frontal lobe measuring approximately 3 cm with some mild edema but no significant mass-effect.  In addition there are 2 small areas noted in the cerebellar area.  Patient is not a candidate for [facility] treatment given the small cell diagnosis without previous radiation therapy to the brain.  Patient would be a candidate for palliative radiation therapy directed at the brain.  I discussed the overall course of treatment for treatments directed at the chest and brain area.  We discussed potentially just treating the brain at this time given her performance status and then consider chest radiation a  later date if she shows response to her brain radiation therapy.  The lesions located in the right upper chest her somewhat away from the esophagus I do not think the patient would have significant esophagitis esophageal irritation from her chest radiation treatment.  After careful evaluation and consideration and discussion with the patient, daughter-in-law and daughter by phone contact patient does wish to proceed with radiation to the brain and to the chest region.  The patient understands that she can stop her therapy at any time given her overall performance status.  Plan:  Patient is scheduled for CT simulation later today.  Treatments to begin April 18.  Anticipate between 10 and 14 treatments to the chest and whole brain.  Patient will be started on Decadron 4 mg twice daily with first dose later this afternoon.  Instructions sent to the [facility] home location where the patient lives.  At a later date the patient will need tapering of her Decadron.  Total time spent in this encounter was  35 minutes which included reviewing the patient's most recent chest CT scans, brain MRI, PET scan, bronchoscopy, cytology/pathology reports, follow-ups, physical examination, and documentation.  -----------------------------------  Blair Promise, PhD, MD  This document serves as a record of services personally performed by Gery Pray, MD. It was created on his behalf by Clerance Lav, a trained medical scribe. The creation of this record is based on the scribe's personal observations and the provider's statements to them. This document has been checked and approved by the attending provider.

## 2020-12-02 DIAGNOSIS — Z510 Encounter for antineoplastic radiation therapy: Secondary | ICD-10-CM | POA: Diagnosis not present

## 2020-12-03 ENCOUNTER — Ambulatory Visit
Admission: RE | Admit: 2020-12-03 | Discharge: 2020-12-03 | Disposition: A | Discharge status: Home / Self Care | Payer: Medicare HMO | Source: Ambulatory Visit | Attending: Radiation Oncology | Admitting: Radiation Oncology

## 2020-12-03 ENCOUNTER — Other Ambulatory Visit: Payer: Self-pay

## 2020-12-03 DIAGNOSIS — Z510 Encounter for antineoplastic radiation therapy: Secondary | ICD-10-CM | POA: Diagnosis not present

## 2020-12-04 ENCOUNTER — Ambulatory Visit
Admission: RE | Admit: 2020-12-04 | Discharge: 2020-12-04 | Disposition: A | Discharge status: Home / Self Care | Payer: Medicare HMO | Source: Ambulatory Visit | Attending: Radiation Oncology | Admitting: Radiation Oncology

## 2020-12-04 DIAGNOSIS — Z510 Encounter for antineoplastic radiation therapy: Secondary | ICD-10-CM | POA: Diagnosis not present

## 2020-12-07 ENCOUNTER — Other Ambulatory Visit: Payer: Self-pay

## 2020-12-07 ENCOUNTER — Ambulatory Visit
Admission: RE | Admit: 2020-12-07 | Discharge: 2020-12-07 | Disposition: A | Discharge status: Home / Self Care | Payer: Medicare HMO | Source: Ambulatory Visit | Attending: Radiation Oncology | Admitting: Radiation Oncology

## 2020-12-07 DIAGNOSIS — Z510 Encounter for antineoplastic radiation therapy: Secondary | ICD-10-CM | POA: Diagnosis not present

## 2020-12-08 ENCOUNTER — Ambulatory Visit
Admission: RE | Admit: 2020-12-08 | Discharge: 2020-12-08 | Disposition: A | Discharge status: Home / Self Care | Payer: Medicare HMO | Source: Ambulatory Visit | Attending: Radiation Oncology | Admitting: Radiation Oncology

## 2020-12-08 DIAGNOSIS — Z510 Encounter for antineoplastic radiation therapy: Secondary | ICD-10-CM | POA: Diagnosis not present

## 2020-12-09 ENCOUNTER — Ambulatory Visit
Admission: RE | Admit: 2020-12-09 | Discharge: 2020-12-09 | Disposition: A | Discharge status: Home / Self Care | Payer: Medicare HMO | Source: Ambulatory Visit | Attending: Radiation Oncology | Admitting: Radiation Oncology

## 2020-12-09 ENCOUNTER — Other Ambulatory Visit: Payer: Self-pay

## 2020-12-09 DIAGNOSIS — Z510 Encounter for antineoplastic radiation therapy: Secondary | ICD-10-CM | POA: Diagnosis not present

## 2020-12-10 ENCOUNTER — Ambulatory Visit
Admission: RE | Admit: 2020-12-10 | Discharge: 2020-12-10 | Disposition: A | Discharge status: Home / Self Care | Payer: Medicare HMO | Source: Ambulatory Visit | Attending: Radiation Oncology | Admitting: Radiation Oncology

## 2020-12-10 DIAGNOSIS — Z510 Encounter for antineoplastic radiation therapy: Secondary | ICD-10-CM | POA: Diagnosis not present

## 2020-12-11 ENCOUNTER — Ambulatory Visit: Admission: RE | Admit: 2020-12-11 | Payer: Medicare HMO | Source: Ambulatory Visit

## 2020-12-11 ENCOUNTER — Other Ambulatory Visit: Payer: Self-pay

## 2020-12-14 ENCOUNTER — Other Ambulatory Visit: Payer: Self-pay

## 2020-12-14 ENCOUNTER — Ambulatory Visit
Admission: RE | Admit: 2020-12-14 | Discharge: 2020-12-14 | Disposition: A | Discharge status: Home / Self Care | Payer: Medicare HMO | Source: Ambulatory Visit | Attending: Radiation Oncology | Admitting: Radiation Oncology

## 2020-12-14 DIAGNOSIS — Z510 Encounter for antineoplastic radiation therapy: Secondary | ICD-10-CM | POA: Diagnosis not present

## 2020-12-15 ENCOUNTER — Ambulatory Visit
Admission: RE | Admit: 2020-12-15 | Discharge: 2020-12-15 | Disposition: A | Discharge status: Home / Self Care | Payer: Medicare HMO | Source: Ambulatory Visit | Attending: Radiation Oncology | Admitting: Radiation Oncology

## 2020-12-15 DIAGNOSIS — Z510 Encounter for antineoplastic radiation therapy: Secondary | ICD-10-CM | POA: Diagnosis not present

## 2020-12-16 ENCOUNTER — Other Ambulatory Visit: Payer: Self-pay

## 2020-12-16 ENCOUNTER — Ambulatory Visit
Admission: RE | Admit: 2020-12-16 | Discharge: 2020-12-16 | Disposition: A | Discharge status: Home / Self Care | Payer: Medicare HMO | Source: Ambulatory Visit | Attending: Radiation Oncology | Admitting: Radiation Oncology

## 2020-12-16 DIAGNOSIS — Z510 Encounter for antineoplastic radiation therapy: Secondary | ICD-10-CM | POA: Diagnosis not present

## 2020-12-17 ENCOUNTER — Ambulatory Visit
Admission: RE | Admit: 2020-12-17 | Discharge: 2020-12-17 | Disposition: A | Discharge status: Home / Self Care | Payer: Medicare HMO | Source: Ambulatory Visit | Attending: Radiation Oncology | Admitting: Radiation Oncology

## 2020-12-17 DIAGNOSIS — Z510 Encounter for antineoplastic radiation therapy: Secondary | ICD-10-CM | POA: Diagnosis not present

## 2020-12-18 ENCOUNTER — Ambulatory Visit
Admission: RE | Admit: 2020-12-18 | Discharge: 2020-12-18 | Disposition: A | Discharge status: Home / Self Care | Payer: Medicare HMO | Source: Ambulatory Visit | Attending: Radiation Oncology | Admitting: Radiation Oncology

## 2020-12-18 ENCOUNTER — Other Ambulatory Visit: Payer: Self-pay

## 2020-12-18 DIAGNOSIS — Z510 Encounter for antineoplastic radiation therapy: Secondary | ICD-10-CM | POA: Diagnosis not present

## 2020-12-21 ENCOUNTER — Other Ambulatory Visit: Payer: Self-pay

## 2020-12-21 ENCOUNTER — Ambulatory Visit
Admission: RE | Admit: 2020-12-21 | Discharge: 2020-12-21 | Disposition: A | Discharge status: Home / Self Care | Payer: Medicare HMO | Source: Ambulatory Visit | Attending: Radiation Oncology | Admitting: Radiation Oncology

## 2020-12-21 DIAGNOSIS — C3411 Malignant neoplasm of upper lobe, right bronchus or lung: Secondary | ICD-10-CM | POA: Insufficient documentation

## 2020-12-21 DIAGNOSIS — Z510 Encounter for antineoplastic radiation therapy: Secondary | ICD-10-CM | POA: Diagnosis present

## 2020-12-21 DIAGNOSIS — C7931 Secondary malignant neoplasm of brain: Secondary | ICD-10-CM | POA: Diagnosis present

## 2020-12-22 ENCOUNTER — Ambulatory Visit: Payer: Medicare HMO

## 2020-12-23 ENCOUNTER — Ambulatory Visit
Admission: RE | Admit: 2020-12-23 | Discharge: 2020-12-23 | Disposition: A | Discharge status: Home / Self Care | Payer: Medicare HMO | Source: Ambulatory Visit | Attending: Radiation Oncology | Admitting: Radiation Oncology

## 2020-12-23 ENCOUNTER — Other Ambulatory Visit: Payer: Self-pay | Admitting: Radiation Oncology

## 2020-12-23 ENCOUNTER — Other Ambulatory Visit: Payer: Self-pay

## 2020-12-23 DIAGNOSIS — Z510 Encounter for antineoplastic radiation therapy: Secondary | ICD-10-CM | POA: Diagnosis not present

## 2020-12-24 ENCOUNTER — Ambulatory Visit
Admission: RE | Admit: 2020-12-24 | Discharge: 2020-12-24 | Disposition: A | Discharge status: Home / Self Care | Payer: Medicare HMO | Source: Ambulatory Visit | Attending: Radiation Oncology | Admitting: Radiation Oncology

## 2020-12-24 DIAGNOSIS — Z510 Encounter for antineoplastic radiation therapy: Secondary | ICD-10-CM | POA: Diagnosis not present

## 2020-12-29 ENCOUNTER — Other Ambulatory Visit: Payer: Self-pay

## 2020-12-29 ENCOUNTER — Non-Acute Institutional Stay: Payer: Medicare HMO | Admitting: Student

## 2020-12-29 DIAGNOSIS — R630 Anorexia: Secondary | ICD-10-CM

## 2020-12-29 DIAGNOSIS — Z515 Encounter for palliative care: Secondary | ICD-10-CM

## 2020-12-29 DIAGNOSIS — R0600 Dyspnea, unspecified: Secondary | ICD-10-CM

## 2020-12-29 DIAGNOSIS — C3411 Malignant neoplasm of upper lobe, right bronchus or lung: Secondary | ICD-10-CM

## 2020-12-29 DIAGNOSIS — R52 Pain, unspecified: Secondary | ICD-10-CM

## 2020-12-29 NOTE — Progress Notes (Signed)
[facility] Palliative Care Consult Note Telephone: [phone]  Fax: [phone]    Date of encounter: 12/29/20 PATIENT NAME: Leah Velasquez [address] [place] [address]   [phone] (home)  DOB: 05-05-1941 MRN: [id number] PRIMARY CARE PROVIDER:    [name], MD,  [place] [address] [phone]  REFERRING PROVIDER:   Katherina Velasquez, [place],  [place] [address] [phone]  RESPONSIBLE PARTY:    Contact Information    Name Relation Home Work [place], [place] Daughter   [phone]   Leah Velasquez Daughter   [phone]   Leah Velasquez Spouse [phone]  [phone]       I met face to face with patient and family in the facility. Palliative Care was asked to follow this patient by consultation request of  Dr. Nyra Velasquez to address advance care planning and complex medical decision making. This is a follow up visit.                                   ASSESSMENT AND PLAN / RECOMMENDATIONS:   Advance Care Planning/Goals of Care: Goals include to maximize quality of life and symptom management. Our advance care planning conversation included a discussion about:     The value and importance of advance care planning   Experiences with loved ones who have been seriously ill or have died   Exploration of personal, cultural or spiritual beliefs that might influence medical decisions   Exploration of goals of care in the event of a sudden injury or illness   Identification and preparation of a healthcare agent   CODE STATUS: Full Code  Symptom Management/Plan:  Small cell lung cancer-stage IV with metastasis to brain- patient completed radiation on 12/24/20. Continue dexamethasone as directed. Follow up with oncology, radiation oncology as scheduled. Patient with occasional shortness of breath-recommend continue oxygen PRN at 2lpm to keep sats >  90%.   Pain-patient with RA, osteoarthritis, generalized pain. Recommend continuing acetaminophen 664m every 6 hours PRN pain, gabapentin 1050mQHS, dicolfenac 1% gel apply 4g topically to bilateral knees and ankles QID.  Appetite-patient with decline in appetite. Recommend continuing mirtazapine 7.61m59mHS, continue routine weights per facility, Boost breeze supplement TID and magic cup with lunch/dinner.  Follow up Palliative Care Visit: Palliative care will continue to follow for complex medical decision making, advance care planning, and clarification of goals. Return in 4 weeks or prn.  I spent 35 minutes providing this consultation. More than 50% of the time in this consultation was spent in counseling and care coordination.   PPS: 40%  HOSPICE ELIGIBILITY/DIAGNOSIS: TBD  Chief Complaint: Palliative Medicine follow up visit.   HISTORY OF PRESENT ILLNESS:  Leah Velasquez a 79 9o. year old female with small cell lung cancer-stage IV with metastasis to brain, atrial fibrillation, hypertension, RA.  Patient with right upper lobe lung mass, mediastinal lymphadenopathy diagnosed June 2021; patient underwent systemic chemotherapy. Patient with chest CT scan 11/02/2020 which showed marked disease progression with enlargement of right upper pulmonary nodules and new progressive thoracic adenopathy. Patient had reported occasional dizziness; MRI ordered and patient with brain metastasis noted. Patient was referred to radiation oncology; patient received radiation to brain and chest region. She was not a candidate for concomitant chemotherapy. She received final radiation treatment on 12/24/20.   Patient resides  at [facility]. She requires assistance with all adl's. Hoyer lift is used for transfers. Poor to fair appetite reported. She reports occasional pain; receives acetaminophen prn with relief.  History obtained from review of EMR, discussion with primary team, and interview with family,  facility staff/caregiver and/or Leah Velasquez.  I reviewed available labs, medications, imaging, studies and related documents from the EMR.  Records reviewed and summarized above.   ROS General: NAD EYES: denies vision changes ENMT: denies dysphagia Cardiovascular: denies chest pain Pulmonary: denies cough, no hemoptysis, SOB with exertion Abdomen: poor to fair appetite, denies constipation GU: denies dysuria, incontinence of urine MSK: weakness Skin: denies rashes or wounds Neurological: denies insomnia Psych: Endorses stable mood Heme/lymph/immuno: denies bruises, abnormal bleeding  Physical Exam: Weights: 12/03/20 179 pounds, 12/28/20 172.8 pounds Constitutional: NAD General: frail appearing, obese  EYES: anicteric sclera, lids intact, no discharge  ENMT: intact hearing, oral mucous membranes moist, dentition intact CV: S1S2, RRR, no LE edema Pulmonary: LCTA, no increased work of breathing, no cough, room air Abdomen: normo-active BS + 4 quadrants, soft and non tender GU: deferred MSK: non-ambulatory Skin: warm and dry, no rashes or wounds on visible skin Neuro: generalized weakness Psych: non-anxious affect, A & O x 2, forgetful Hem/lymph/immuno: no widespread bruising   Thank you for the opportunity to participate in the care of Leah Velasquez.  The palliative care team will continue to follow. Please call our office at [phone] if we can be of additional assistance.   Leah Slocumb, NP    COVID-19 PATIENT SCREENING TOOL Asked and negative response unless otherwise noted:   Have you had symptoms of covid, tested positive or been in contact with someone with symptoms/positive test in the past 5-10 days? No

## 2021-01-25 ENCOUNTER — Ambulatory Visit: Payer: Self-pay | Admitting: Radiation Oncology

## 2021-01-25 ENCOUNTER — Telehealth: Payer: Self-pay | Admitting: Internal Medicine

## 2021-01-25 NOTE — Telephone Encounter (Signed)
Scheduled appointment per 06/06 sch msg Left a detailed message.

## 2021-02-02 ENCOUNTER — Encounter (HOSPITAL_COMMUNITY): Payer: Self-pay | Admitting: Internal Medicine

## 2021-02-02 ENCOUNTER — Telehealth: Payer: Self-pay | Admitting: Student

## 2021-02-02 ENCOUNTER — Emergency Department (HOSPITAL_COMMUNITY): Payer: Medicare HMO

## 2021-02-02 ENCOUNTER — Inpatient Hospital Stay (HOSPITAL_COMMUNITY)
Admission: EM | Admit: 2021-02-02 | Discharge: 2021-02-19 | DRG: 951 | Disposition: E | Payer: Medicare HMO | Attending: Family Medicine | Admitting: Family Medicine

## 2021-02-02 DIAGNOSIS — Z20822 Contact with and (suspected) exposure to covid-19: Secondary | ICD-10-CM | POA: Diagnosis present

## 2021-02-02 DIAGNOSIS — I10 Essential (primary) hypertension: Secondary | ICD-10-CM | POA: Diagnosis present

## 2021-02-02 DIAGNOSIS — M170 Bilateral primary osteoarthritis of knee: Secondary | ICD-10-CM | POA: Diagnosis present

## 2021-02-02 DIAGNOSIS — E785 Hyperlipidemia, unspecified: Secondary | ICD-10-CM | POA: Diagnosis present

## 2021-02-02 DIAGNOSIS — R791 Abnormal coagulation profile: Secondary | ICD-10-CM | POA: Diagnosis present

## 2021-02-02 DIAGNOSIS — R0602 Shortness of breath: Secondary | ICD-10-CM | POA: Diagnosis present

## 2021-02-02 DIAGNOSIS — J189 Pneumonia, unspecified organism: Secondary | ICD-10-CM | POA: Diagnosis present

## 2021-02-02 DIAGNOSIS — Z79899 Other long term (current) drug therapy: Secondary | ICD-10-CM

## 2021-02-02 DIAGNOSIS — E43 Unspecified severe protein-calorie malnutrition: Secondary | ICD-10-CM | POA: Diagnosis present

## 2021-02-02 DIAGNOSIS — C3491 Malignant neoplasm of unspecified part of right bronchus or lung: Secondary | ICD-10-CM | POA: Diagnosis present

## 2021-02-02 DIAGNOSIS — Z885 Allergy status to narcotic agent status: Secondary | ICD-10-CM

## 2021-02-02 DIAGNOSIS — Z6830 Body mass index (BMI) 30.0-30.9, adult: Secondary | ICD-10-CM | POA: Diagnosis not present

## 2021-02-02 DIAGNOSIS — R531 Weakness: Secondary | ICD-10-CM | POA: Diagnosis not present

## 2021-02-02 DIAGNOSIS — Z515 Encounter for palliative care: Secondary | ICD-10-CM | POA: Diagnosis not present

## 2021-02-02 DIAGNOSIS — Z9221 Personal history of antineoplastic chemotherapy: Secondary | ICD-10-CM

## 2021-02-02 DIAGNOSIS — Z7901 Long term (current) use of anticoagulants: Secondary | ICD-10-CM | POA: Diagnosis not present

## 2021-02-02 DIAGNOSIS — E870 Hyperosmolality and hypernatremia: Secondary | ICD-10-CM | POA: Diagnosis present

## 2021-02-02 DIAGNOSIS — J9601 Acute respiratory failure with hypoxia: Secondary | ICD-10-CM | POA: Diagnosis present

## 2021-02-02 DIAGNOSIS — M069 Rheumatoid arthritis, unspecified: Secondary | ICD-10-CM | POA: Diagnosis present

## 2021-02-02 DIAGNOSIS — Z7189 Other specified counseling: Secondary | ICD-10-CM

## 2021-02-02 DIAGNOSIS — C7931 Secondary malignant neoplasm of brain: Secondary | ICD-10-CM | POA: Diagnosis present

## 2021-02-02 DIAGNOSIS — Z882 Allergy status to sulfonamides status: Secondary | ICD-10-CM

## 2021-02-02 DIAGNOSIS — K219 Gastro-esophageal reflux disease without esophagitis: Secondary | ICD-10-CM | POA: Diagnosis present

## 2021-02-02 DIAGNOSIS — Z87891 Personal history of nicotine dependence: Secondary | ICD-10-CM

## 2021-02-02 DIAGNOSIS — I248 Other forms of acute ischemic heart disease: Secondary | ICD-10-CM | POA: Diagnosis present

## 2021-02-02 DIAGNOSIS — I4819 Other persistent atrial fibrillation: Secondary | ICD-10-CM | POA: Diagnosis present

## 2021-02-02 DIAGNOSIS — E8809 Other disorders of plasma-protein metabolism, not elsewhere classified: Secondary | ICD-10-CM | POA: Diagnosis present

## 2021-02-02 DIAGNOSIS — C3411 Malignant neoplasm of upper lobe, right bronchus or lung: Secondary | ICD-10-CM | POA: Diagnosis present

## 2021-02-02 DIAGNOSIS — D696 Thrombocytopenia, unspecified: Secondary | ICD-10-CM | POA: Diagnosis present

## 2021-02-02 DIAGNOSIS — Z66 Do not resuscitate: Secondary | ICD-10-CM | POA: Diagnosis present

## 2021-02-02 DIAGNOSIS — D539 Nutritional anemia, unspecified: Secondary | ICD-10-CM | POA: Diagnosis present

## 2021-02-02 DIAGNOSIS — Z85118 Personal history of other malignant neoplasm of bronchus and lung: Secondary | ICD-10-CM

## 2021-02-02 DIAGNOSIS — R54 Age-related physical debility: Secondary | ICD-10-CM | POA: Diagnosis present

## 2021-02-02 DIAGNOSIS — C3490 Malignant neoplasm of unspecified part of unspecified bronchus or lung: Secondary | ICD-10-CM

## 2021-02-02 DIAGNOSIS — E782 Mixed hyperlipidemia: Secondary | ICD-10-CM | POA: Diagnosis not present

## 2021-02-02 DIAGNOSIS — E876 Hypokalemia: Secondary | ICD-10-CM | POA: Diagnosis present

## 2021-02-02 DIAGNOSIS — I4891 Unspecified atrial fibrillation: Secondary | ICD-10-CM

## 2021-02-02 DIAGNOSIS — N179 Acute kidney failure, unspecified: Secondary | ICD-10-CM | POA: Diagnosis present

## 2021-02-02 LAB — PROTIME-INR
INR: 6.6 (ref 0.8–1.2)
Prothrombin Time: 57.8 seconds — ABNORMAL HIGH (ref 11.4–15.2)

## 2021-02-02 LAB — COMPREHENSIVE METABOLIC PANEL
ALT: 15 U/L (ref 0–44)
AST: 19 U/L (ref 15–41)
Albumin: 2 g/dL — ABNORMAL LOW (ref 3.5–5.0)
Alkaline Phosphatase: 88 U/L (ref 38–126)
Anion gap: 13 (ref 5–15)
BUN: 48 mg/dL — ABNORMAL HIGH (ref 8–23)
CO2: 23 mmol/L (ref 22–32)
Calcium: 9.1 mg/dL (ref 8.9–10.3)
Chloride: 108 mmol/L (ref 98–111)
Creatinine, Ser: 1.5 mg/dL — ABNORMAL HIGH (ref 0.44–1.00)
GFR, Estimated: 35 mL/min — ABNORMAL LOW (ref >60)
Glucose, Bld: 126 mg/dL — ABNORMAL HIGH (ref 70–99)
Potassium: 3.5 mmol/L (ref 3.5–5.1)
Sodium: 144 mmol/L (ref 135–145)
Total Bilirubin: 0.8 mg/dL (ref 0.3–1.2)
Total Protein: 6.2 g/dL — ABNORMAL LOW (ref 6.5–8.1)

## 2021-02-02 LAB — CBC WITH DIFFERENTIAL/PLATELET
Abs Immature Granulocytes: 0.09 10*3/uL — ABNORMAL HIGH (ref 0.00–0.07)
Basophils Absolute: 0.1 10*3/uL (ref 0.0–0.1)
Basophils Relative: 1 %
Eosinophils Absolute: 0 10*3/uL (ref 0.0–0.5)
Eosinophils Relative: 0 %
HCT: 35.7 % — ABNORMAL LOW (ref 36.0–46.0)
Hemoglobin: 11.1 g/dL — ABNORMAL LOW (ref 12.0–15.0)
Immature Granulocytes: 1 %
Lymphocytes Relative: 24 %
Lymphs Abs: 2.2 10*3/uL (ref 0.7–4.0)
MCH: 34.4 pg — ABNORMAL HIGH (ref 26.0–34.0)
MCHC: 31.1 g/dL (ref 30.0–36.0)
MCV: 110.5 fL — ABNORMAL HIGH (ref 80.0–100.0)
Monocytes Absolute: 1.1 10*3/uL — ABNORMAL HIGH (ref 0.1–1.0)
Monocytes Relative: 11 %
Neutro Abs: 5.8 10*3/uL (ref 1.7–7.7)
Neutrophils Relative %: 63 %
Platelets: 140 10*3/uL — ABNORMAL LOW (ref 150–400)
RBC: 3.23 MIL/uL — ABNORMAL LOW (ref 3.87–5.11)
RDW: 16.6 % — ABNORMAL HIGH (ref 11.5–15.5)
WBC: 9.3 10*3/uL (ref 4.0–10.5)
nRBC: 0.2 % (ref 0.0–0.2)

## 2021-02-02 LAB — I-STAT ARTERIAL BLOOD GAS, ED
Acid-base deficit: 3 mmol/L — ABNORMAL HIGH (ref 0.0–2.0)
Bicarbonate: 21.4 mmol/L (ref 20.0–28.0)
Calcium, Ion: 1.21 mmol/L (ref 1.15–1.40)
HCT: 45 % (ref 36.0–46.0)
Hemoglobin: 15.3 g/dL — ABNORMAL HIGH (ref 12.0–15.0)
O2 Saturation: 96 %
Patient temperature: 98.6
Potassium: 3.2 mmol/L — ABNORMAL LOW (ref 3.5–5.1)
Sodium: 147 mmol/L — ABNORMAL HIGH (ref 135–145)
TCO2: 23 mmol/L (ref 22–32)
pCO2 arterial: 35.5 mmHg (ref 32.0–48.0)
pH, Arterial: 7.389 (ref 7.350–7.450)
pO2, Arterial: 85 mmHg (ref 83.0–108.0)

## 2021-02-02 LAB — RESP PANEL BY RT-PCR (FLU A&B, COVID) ARPGX2
Influenza A by PCR: NEGATIVE
Influenza B by PCR: NEGATIVE
SARS Coronavirus 2 by RT PCR: NEGATIVE

## 2021-02-02 LAB — TROPONIN I (HIGH SENSITIVITY): Troponin I (High Sensitivity): 26 ng/L — ABNORMAL HIGH (ref <18)

## 2021-02-02 LAB — BRAIN NATRIURETIC PEPTIDE: B Natriuretic Peptide: 126.9 pg/mL — ABNORMAL HIGH (ref 0.0–100.0)

## 2021-02-02 IMAGING — DX DG CHEST 1V PORT
1 series · 1 of 1 positions shown · non-contrast
Comparison: Radiograph [DATE], CT [DATE]

CLINICAL DATA: Shortness of breath

EXAM:
PORTABLE CHEST 1 VIEW

[chest ap]
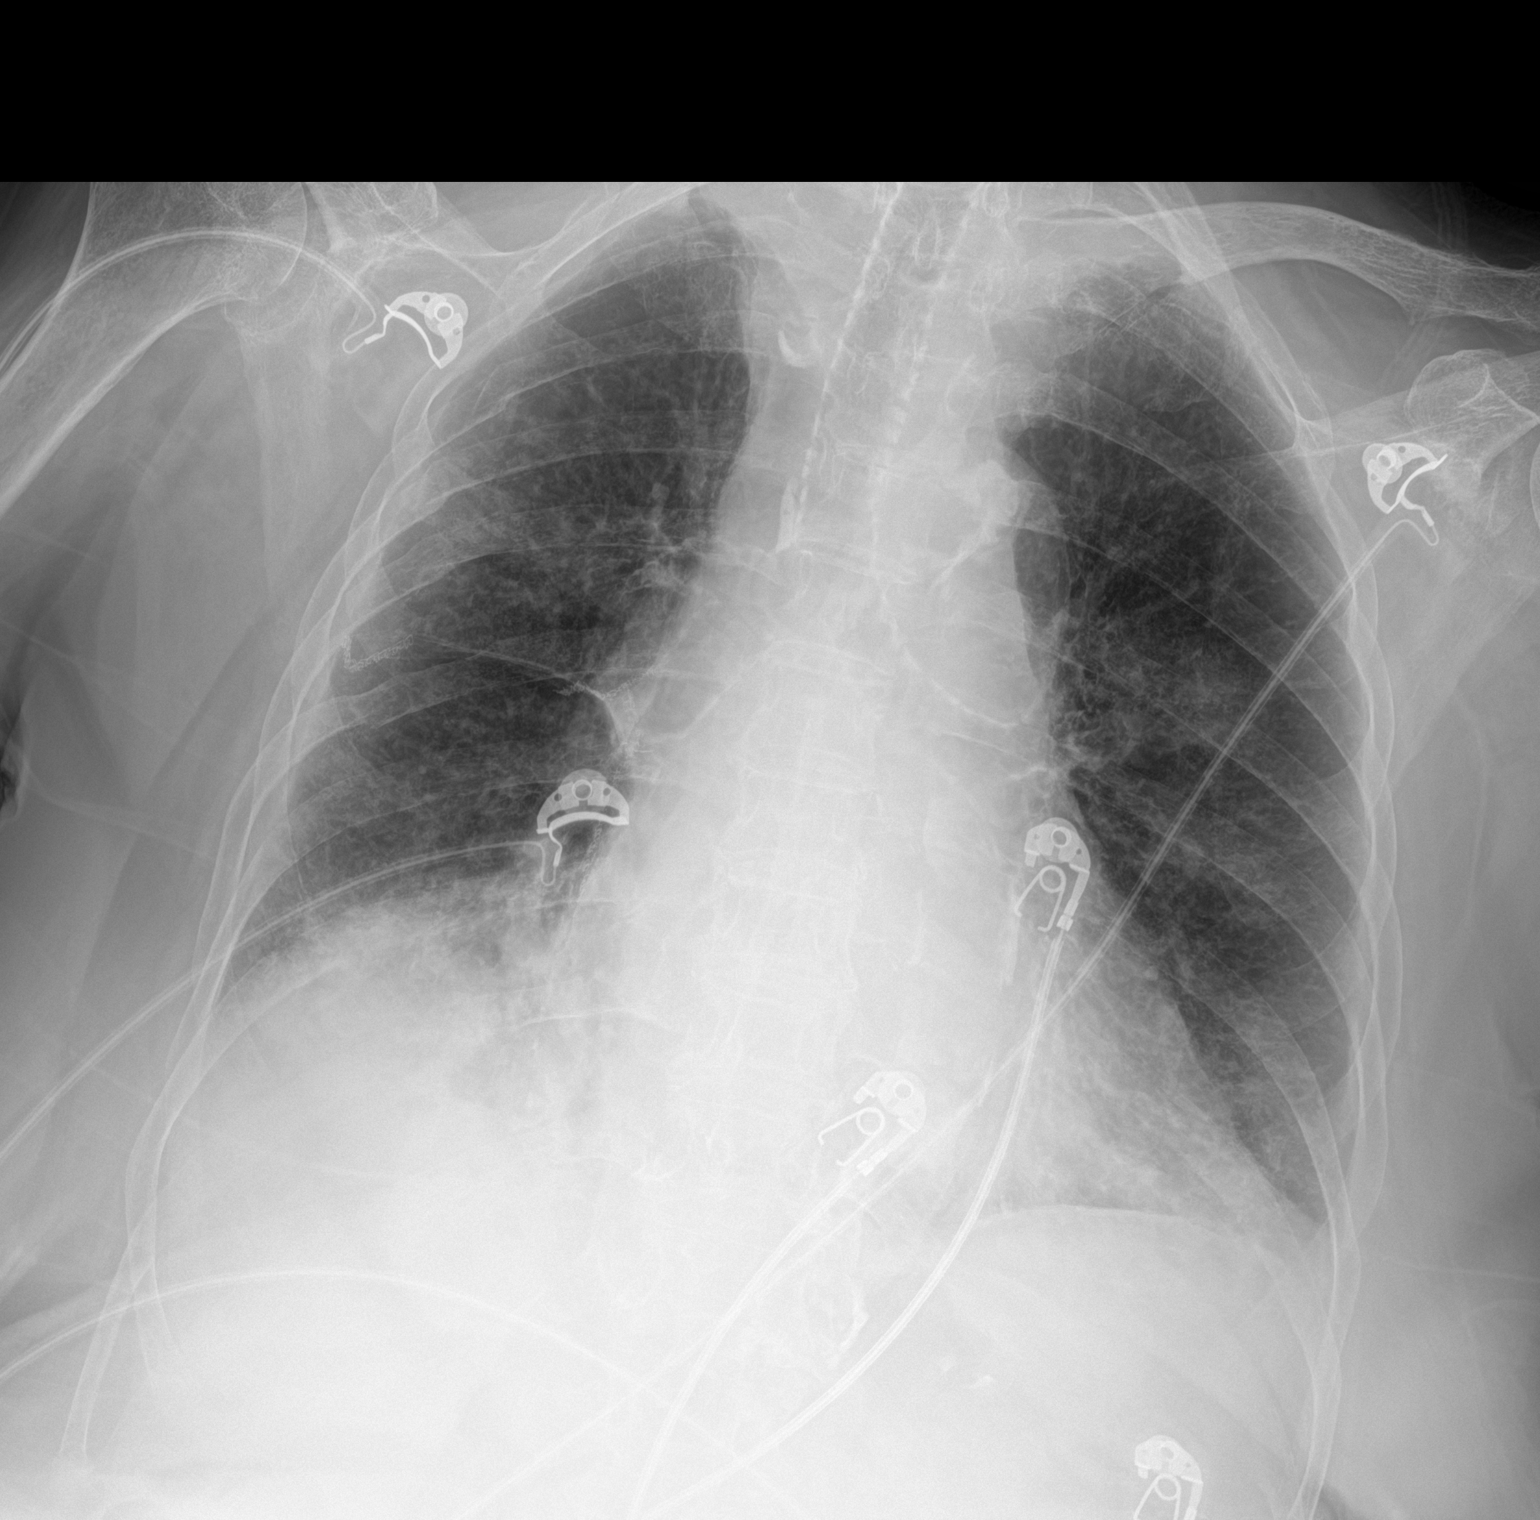

[1 of 1 positions shown; findings below may reference images not displayed]

FINDINGS: Redemonstration of pleural thickening in the right lung apex in a
region of a previously seen pleural based malignancy comparison
imaging. Postsurgical changes are noted in the right hilum and mid
lung. Some consolidative opacities present in the right lung base
partially silhouettes the right hemidiaphragm and right heart
border. More diffuse gradient of hazy opacity in the mid to lower
lung likely reflecting atelectatic changes as well. The aorta is
calcified. The remaining cardiomediastinal contours are
unremarkable. No acute osseous or soft tissue abnormality.
Degenerative changes are present in the imaged spine and shoulders.
Telemetry leads overlie the chest.
IMPRESSION: Nonspecific pleural thickening of the right lung apex in a region of
a previously seen pleural based malignancy.

Consolidative opacity in the right mid to lower lung partially
silhouetting the right heart border and right hemidiaphragm. Could
reflect acute airspace disease or other pleuroparenchymal process.

Given patient history and new findings, consider cross-sectional
imaging.

Additional dependent atelectatic features.

Postsurgical changes in the right lung.

Aortic Atherosclerosis ([WT]-[WT]).

## 2021-02-02 MED ORDER — LORAZEPAM 2 MG/ML IJ SOLN: 1.0000 mg | INTRAMUSCULAR | Status: DC | PRN

## 2021-02-02 MED ORDER — GLYCOPYRROLATE 0.2 MG/ML IJ SOLN: 0.2000 mg | INTRAMUSCULAR | Status: DC | PRN

## 2021-02-02 MED ORDER — GLYCOPYRROLATE 1 MG PO TABS
1.0000 mg | ORAL_TABLET | ORAL | Status: DC | PRN
  Filled 2021-02-02: qty 1

## 2021-02-02 MED ORDER — SODIUM CHLORIDE 0.9 % IV SOLN
1.0000 g | Freq: Once | INTRAVENOUS | Status: AC
  Administered 2021-02-02: 1 g via INTRAVENOUS
  Filled 2021-02-02: qty 10

## 2021-02-02 MED ORDER — HALOPERIDOL LACTATE 5 MG/ML IJ SOLN: 0.5000 mg | INTRAMUSCULAR | Status: DC | PRN

## 2021-02-02 MED ORDER — BIOTENE DRY MOUTH MT LIQD: 15.0000 mL | OROMUCOSAL | Status: DC | PRN

## 2021-02-02 MED ORDER — ACETAMINOPHEN 325 MG PO TABS: 650.0000 mg | ORAL_TABLET | Freq: Four times a day (QID) | ORAL | Status: DC | PRN

## 2021-02-02 MED ORDER — ONDANSETRON HCL 4 MG/2ML IJ SOLN: 4.0000 mg | Freq: Four times a day (QID) | INTRAMUSCULAR | Status: DC | PRN

## 2021-02-02 MED ORDER — DIPHENHYDRAMINE HCL 50 MG/ML IJ SOLN: 12.5000 mg | INTRAMUSCULAR | Status: DC | PRN

## 2021-02-02 MED ORDER — ONDANSETRON 4 MG PO TBDP: 4.0000 mg | ORAL_TABLET | Freq: Four times a day (QID) | ORAL | Status: DC | PRN

## 2021-02-02 MED ORDER — HALOPERIDOL LACTATE 2 MG/ML PO CONC
0.5000 mg | ORAL | Status: DC | PRN
  Filled 2021-02-02: qty 0.3

## 2021-02-02 MED ORDER — POLYVINYL ALCOHOL 1.4 % OP SOLN
1.0000 [drp] | Freq: Four times a day (QID) | OPHTHALMIC | Status: DC | PRN
  Filled 2021-02-02: qty 15

## 2021-02-02 MED ORDER — FENTANYL 2500MCG IN NS 250ML (10MCG/ML) PREMIX INFUSION
0.0000 ug/h | INTRAVENOUS | Status: DC
  Administered 2021-02-02: 20 ug/h via INTRAVENOUS
  Filled 2021-02-02: qty 250

## 2021-02-02 MED ORDER — SODIUM CHLORIDE 0.9 % IV SOLN
500.0000 mg | Freq: Once | INTRAVENOUS | Status: AC
  Administered 2021-02-02: 500 mg via INTRAVENOUS
  Filled 2021-02-02: qty 500

## 2021-02-02 MED ORDER — HALOPERIDOL 0.5 MG PO TABS
0.5000 mg | ORAL_TABLET | ORAL | Status: DC | PRN
  Filled 2021-02-02: qty 1

## 2021-02-02 MED ORDER — LORAZEPAM 2 MG/ML PO CONC: 1.0000 mg | ORAL | Status: DC | PRN

## 2021-02-02 MED ORDER — ACETAMINOPHEN 650 MG RE SUPP: 650.0000 mg | Freq: Four times a day (QID) | RECTAL | Status: DC | PRN

## 2021-02-02 MED ORDER — LORAZEPAM 1 MG PO TABS: 1.0000 mg | ORAL_TABLET | ORAL | Status: DC | PRN

## 2021-02-02 MED ORDER — FENTANYL BOLUS VIA INFUSION
20.0000 ug | INTRAVENOUS | Status: DC | PRN
  Administered 2021-02-03: 20 ug via INTRAVENOUS
  Filled 2021-02-02: qty 20

## 2021-02-02 NOTE — ED Notes (Signed)
Report given to Emily D, RN

## 2021-02-02 NOTE — Progress Notes (Signed)
Pt placed on 15L HFNC. Sat 737% No complications. RN at bedside, MD aware, Rt will continue to monitor. BiPAP in standby

## 2021-02-02 NOTE — ED Triage Notes (Signed)
Pt arrived via [facility] with c/c of respiratory distress.  Per EMS pt recently has d/c with O2. Pt has hx of Neoplasms of right lung.  Pt is from [facility].   2 nebs.  86% [place], 120-130 HR A-FIB, 99%, 124/68, BS 154

## 2021-02-02 NOTE — ED Notes (Signed)
Pt monitor switched to comfort care profile per MD order at this time.

## 2021-02-02 NOTE — ED Notes (Signed)
Paged Horris Latino MD to call me in regards to pt's daughter's wishes. Awaiting response.

## 2021-02-02 NOTE — ED Notes (Signed)
Palliative NP at bedside

## 2021-02-02 NOTE — ED Provider Notes (Signed)
[facility]ERGENCY DEPARTMENT Provider Note   CSN: [id number] Arrival date & time: 02/17/2021  0319     History Chief Complaint  Patient presents with   Respiratory Distress    Pt arrived via [facility] with c/c of respiratory distress.  Per EMS pt recently has d/c with O2. Pt has hx of Neoplasms of right lung.  Pt is from [facility].   2 nebs.  86% [facility], 120-130 HR A-FIB, 99%, 124/68, BS [facility]quez is a 80 y.o. female.  The history is provided by the EMS personnel.   80 y.o. F with hx of AFIB on coumadin, GERD, HLP, HTN, non small cell lung cancer, RA, presenting to the ED via EMS from [facility] with respiratory distress.  EMS reports upon their arrival patient was essentially unresponsive and cyanotic.  She was 2L via nasal canula but only saturating around 86%.  She was given duonebs x2 and since has started to come around a bit.  Per EMS, staff RN at facility reported she was [facility] and had only been taking care of her today so not entirely sure what her baseline is.  She is a DNR per facility paperwork but does not have golden ticket.  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/25/2020   Pressure injury of skin 07/27/2020   Physical deconditioning 07/27/2020   Hypokalemia 07/27/2020   Hypomagnesemia 07/27/2020   Pancytopenia ([place]) 07/24/2020   Altered mental status 05/11/2020   History of memory loss 05/11/2020   Encounter for antineoplastic chemotherapy 05/04/2020   Goals of care, counseling/discussion 05/04/2020   Small cell lung cancer, right upper lobe ([facility]) 04/24/2020    Rheumatoid arthritis ([facility]) 04/24/2020   Primary osteoarthritis of both knees 04/24/2020   Non-traumatic rhabdomyolysis    Non-small cell carcinoma of right lung, stage 3 ([facility]) 02/27/2020   Paresthesias 02/15/2020   Frequent falls    Lung mass 02/14/2020   Persistent atrial fibrillation ([facility]) 10/22/2017   Generalized weakness 10/22/2017   Essential hypertension 10/22/2017   Thrombocytopenia ([facility]) 10/22/2017   Hyperlipidemia 10/22/2017    Past Surgical History:  Procedure Laterality Date   BREAST EXCISIONAL BIOPSY Left    x 2   BREAST EXCISIONAL BIOPSY Right    BRONCHIAL BIOPSY  02/18/2020   Procedure: BRONCHIAL BIOPSIES;  Surgeon: Collene Gobble, MD;  Location: [place];  Service: Cardiopulmonary;;   BRONCHIAL BRUSHINGS  02/18/2020   Procedure: BRONCHIAL BRUSHINGS;  Surgeon: Collene Gobble, MD;  Location: [place];  Service: Cardiopulmonary;;   BRONCHIAL NEEDLE ASPIRATION BIOPSY  02/18/2020   Procedure: BRONCHIAL NEEDLE ASPIRATION BIOPSIES;  Surgeon: Collene Gobble, MD;  Location: [name];  Service: Cardiopulmonary;;   BRONCHIAL WASHINGS  02/18/2020   Procedure: BRONCHIAL WASHINGS;  Surgeon: Collene Gobble, MD;  Location: [name];  Service: Cardiopulmonary;;   CARDIOVERSION N/A 01/09/2018   Procedure: CARDIOVERSION;  Surgeon: Josue Hector, MD;  Location: [name];  Service: Cardiovascular;  Laterality: N/A;   CARDIOVERSION N/A 03/01/2018   Procedure:  CARDIOVERSION;  Surgeon: Sueanne Margarita, MD;  Location: [facility];  Service: Cardiovascular;  Laterality: N/A;   ELECTROMAGNETIC NAVIGATION BROCHOSCOPY N/A 02/18/2020   Procedure: ELECTROMAGNETIC NAVIGATION BRONCHOSCOPY;  Surgeon: Collene Gobble, MD;  Location: [name];  Service: Cardiopulmonary;  Laterality: N/A;   KNEE SURGERY     LUNG LOBECTOMY Right 2000   RLL removed   VIDEO BRONCHOSCOPY N/A 02/18/2020   Procedure: VIDEO BRONCHOSCOPY WITH FLUORO;  Surgeon: Collene Gobble, MD;  Location: [facility];  Service: Cardiopulmonary;  Laterality: N/A;   VIDEO BRONCHOSCOPY WITH ENDOBRONCHIAL ULTRASOUND N/A 04/22/2020   Procedure: VIDEO BRONCHOSCOPY WITH ENDOBRONCHIAL ULTRASOUND;  Surgeon: [facility] Nakayama, MD;  Location: [name];  Service: Thoracic;  Laterality: N/A;     OB History   No obstetric history on file.     No family history on file.  Social History   Tobacco Use   Smoking status: Former    Pack years: 0.00   Smokeless tobacco: Never  Vaping Use   Vaping Use: Never used  Substance Use Topics   Alcohol use: No   Drug use: No    Home Medications Prior to Admission medications   Medication Sig Start Date End Date Taking? Authorizing Provider  acetaminophen (TYLENOL) 325 MG tablet Take 650 mg by mouth every 6 (six) hours as needed for mild pain.    [provider]  acetaminophen (TYLENOL) 500 MG tablet Take 500 mg by mouth 2 (two) times daily as needed. For left knee pain    [provider]  bisacodyl (DULCOLAX) 10 MG suppository Place 10 mg rectally See admin instructions. If not relieved by MOM give 10mg  Bisacodyl supp. Rectally for x1 dose in 24 hours prn constipation     [provider]  [facility] (UTI-STAT) LIQD Take 30 mLs by mouth daily in the afternoon. For UTI prophylaxis Patient not taking: Reported on 11/25/2020    [provider]  diclofenac Sodium (VOLTAREN) 1 % GEL Apply 4 g topically 4 (four) times daily. To bilateral knees and ankles 04/24/20   Reed, [facility] L, DO  feeding supplement (ENSURE ENLIVE / ENSURE PLUS) LIQD Take 237 mLs by mouth 3 (three) times daily.    [provider]  gabapentin (NEURONTIN) 100 MG capsule Take 100 mg by mouth at bedtime.    [provider]  levocetirizine (XYZAL) 5 MG tablet Take 5 mg by mouth daily.  01/28/20   [provider]  metoprolol succinate (TOPROL-XL) 25 MG 24 hr tablet Take 12.5 mg by mouth at bedtime.  12/18/19   [provider]  mirtazapine (REMERON) 7.5 MG tablet Take 7.5 mg by mouth at bedtime.    [provider]  MYRBETRIQ 25 MG TB24 tablet Take 25 mg by mouth daily. 06/09/20   [provider]  NON FORMULARY Magic Cup Velasquez/D Meals. 07/29/20   [provider]  OXYGEN Inhale 2 Velasquez into the lungs continuous. Patient not taking: Reported on 11/25/2020 07/28/20   [provider]  simvastatin (ZOCOR) 20 MG tablet Take 20 mg by mouth at bedtime.    [provider]  Sodium Phosphates (RA SALINE ENEMA RE) Place 1 Container rectally See admin instructions. If not relieved by bisacodyl supp. Give disposable saline enema rectally x1 dose 24 hours prn constipation     [provider]  timolol (TIMOPTIC) 0.5 % ophthalmic solution Place 1 drop into both eyes at bedtime.  11/03/19   [provider]  vitamin B-12 1000  MCG tablet Take 1 tablet (1,000 mcg total) by mouth daily. 02/20/20   Andrew Au, MD  warfarin (COUMADIN) 3 MG tablet Take 3 mg by mouth daily in the afternoon.    [provider]    Allergies    Morphine, Morphine and related, and Sulfa antibiotics  Review of Systems   Review of Systems  Unable to perform ROS: Acuity of condition   Physical Exam Updated Vital Signs BP 136/80   Pulse (!) 140   Temp 98 F (36.7 C) (Rectal)   Resp (!) 26   Ht 5\' 2"  (1.575 m)   Wt 76.8 kg   SpO2 100%   BMI 30.97 kg/m   Physical Exam Vitals and nursing note reviewed.  Constitutional:      Appearance: She is well-developed.     Comments: Somnolent but arouses when spoken to and during IV start, appears unwell  HENT:     Head: Normocephalic and atraumatic.  Eyes:     Conjunctiva/sclera: Conjunctivae normal.     Pupils: Pupils are equal, round, and reactive to light.  Cardiovascular:     Rate and Rhythm: Normal rate and regular rhythm.     Heart sounds: Normal heart sounds.  Pulmonary:     Effort: Pulmonary effort is normal.     Breath sounds:  Rhonchi present.     Comments: Scattered rhonchi, belly breathing, sats mid 90's on NRB Abdominal:     General: Bowel sounds are normal.     Palpations: Abdomen is soft.  Musculoskeletal:        General: Normal range of motion.     Cervical back: Normal range of motion.  Skin:    General: Skin is warm and dry.  Neurological:     Mental Status: She is oriented to person, place, and time.    ED Results / Procedures / Treatments   Labs (all labs ordered are listed, but only abnormal results are displayed) Labs Reviewed  CBC WITH DIFFERENTIAL/PLATELET - Abnormal; Notable for the following components:      Result Value   RBC 3.23 (*)    Hemoglobin 11.1 (*)    HCT 35.7 (*)    MCV 110.5 (*)    MCH 34.4 (*)    RDW 16.6 (*)    Platelets 140 (*)    Monocytes Absolute 1.1 (*)    Abs Immature Granulocytes 0.09 (*)    All other components within normal limits  COMPREHENSIVE METABOLIC PANEL - Abnormal; Notable for the following components:   Glucose, Bld 126 (*)    BUN 48 (*)    Creatinine, Ser 1.50 (*)    Total Protein 6.2 (*)    Albumin 2.0 (*)    GFR, Estimated 35 (*)    All other components within normal limits  PROTIME-INR - Abnormal; Notable for the following components:   Prothrombin Time 57.8 (*)    INR 6.6 (*)    All other components within normal limits  BRAIN NATRIURETIC PEPTIDE - Abnormal; Notable for the following components:   B Natriuretic Peptide 126.9 (*)    All other components within normal limits  I-STAT ARTERIAL BLOOD GAS, ED - Abnormal; Notable for the following components:   Acid-base deficit 3.0 (*)    Sodium 147 (*)    Potassium 3.2 (*)    Hemoglobin 15.3 (*)    All other components within normal limits  TROPONIN I (HIGH SENSITIVITY) - Abnormal; Notable for the following components:   Troponin I (High  Sensitivity) 26 (*)    All other components within normal limits  RESP PANEL BY RT-PCR (FLU A&B, COVID) [id number]    EKG None  Radiology DG Chest  Port 1 View  Result Date: 01/31/2021 CLINICAL DATA:  Shortness of breath EXAM: PORTABLE CHEST 1 VIEW COMPARISON:  Radiograph 04/16/2020, CT 11/02/2020 FINDINGS: Redemonstration of pleural thickening in the right lung apex in a region of a previously seen pleural based malignancy comparison imaging. Postsurgical changes are noted in the right hilum and mid lung. Some consolidative opacities present in the right lung base partially silhouettes the right hemidiaphragm and right heart border. More diffuse gradient of hazy opacity in the mid to lower lung likely reflecting atelectatic changes as well. The aorta is calcified. The remaining cardiomediastinal contours are unremarkable. No acute osseous or soft tissue abnormality. Degenerative changes are present in the imaged spine and shoulders. Telemetry leads overlie the chest. IMPRESSION: Nonspecific pleural thickening of the right lung apex in a region of a previously seen pleural based malignancy. Consolidative opacity in the right mid to lower lung partially silhouetting the right heart border and right hemidiaphragm. Could reflect acute airspace disease or other pleuroparenchymal process. Given patient history and [facility] findings, consider cross-sectional imaging. Additional dependent atelectatic features. Postsurgical changes in the right lung. Aortic Atherosclerosis (ICD10-I70.0). Electronically Signed   By: Lovena Le M.D.   On: 02/07/2021 03:57    Procedures Procedures   Medications Ordered in ED Medications  azithromycin (ZITHROMAX) 500 mg in sodium chloride 0.9 % 250 mL IVPB (500 mg Intravenous [facility] Bag/Given 02/04/2021 0559)  cefTRIAXone (ROCEPHIN) 1 g in sodium chloride 0.9 % 100 mL IVPB (0 g Intravenous Stopped 02/14/2021 0557)    ED Course  I have reviewed the triage vital signs and the nursing notes.  Pertinent labs & imaging results that were available during my care of the patient were reviewed by me and considered in my medical decision making  (see chart for details).    MDM Rules/Calculators/A&P  80 year old female presenting to the ED from SNF for respiratory distress.  Does have history of non small cell lung cancer.  Upon EMS arrival, 86% on 2 Velasquez, reportedly she essentially unresponsive and cyanotic.  She was given DuoNeb x2 and started on nonrebreather with some improvement.  On arrival, she is somnolent but does arouse when spoken to and during IV start.  Will transition to bipap.  Labs and CXR pending.  Patient is a DNR per facility paperwork, no golden ticket present  with her.  4:05 AM Patient much more awake/alert after starting bipap.  ABG as above.  Remainder of work-up pending.  Will continue to monitor.    Labs as above-- normal WBC count, stable hemoglobin, thrombocytopenia which is chronic.  Mildly elevated BUN and SrCr compared with baseline, questionable some dehydration.  Trop 26-- nonspecific but will trend.  INR is supra therapeutic at 6.6 without evidence of active bleeding.  CXR with questionable pneumonia in right mid-lower lung, known cancerous changes on left.  Covid/flu panel is negative.  Patient has improved with bipap, she is able to answer questions for my by nodding "yes" and "no".  On most recent re-check HR down to 115-118 from 140's.  She is in AFIB which is chronic.  BP 470'J systolic.  Will start on IV abx, continue bipap.  Admit for ongoing care.  Discussed with Dr. Hal Hope-- will admit for ongoing care.  Final Clinical Impression(s) / ED Diagnoses Final diagnoses:  Acute respiratory failure with  hypoxia ([place])  Supratherapeutic INR    Rx / DC Orders ED Discharge Orders     None        Larene Pickett, PA-C 02/17/2021 1836    [facility] Fraise, MD 02/05/2021 [phone]

## 2021-02-02 NOTE — Progress Notes (Signed)
[facility]) Hospital Liaison note:  This patient is currently enrolled in [facility] outpatient-based Palliative Care. Will continue to follow for disposition.  Please call with any outpatient palliative questions or concerns.  Thank you, Lorelee Market, LPN [facility] Liaison [phone]

## 2021-02-02 NOTE — Progress Notes (Signed)
Patient has arrived to unit from ED with Raquel Sarna, Alicia. Handoff completed, patient put into gown. Fentanyl drip at 20 mcg/hr. High flow O2 at 12 L/min.  Ring that belongs to patient was taken off and put into bag.

## 2021-02-02 NOTE — ED Notes (Signed)
Pt's daughter called. They said that pt has been DNR DNI for a month now and comfort measures at the nursing home. They wish for her to go back to the nursing home w/ comfort measures and hospice orders like ativan and stuff to keep her comfortable. She said that the pt has declined greatly over the past week. Her number is [phone]. Notified [place] MD.

## 2021-02-02 NOTE — Discharge Planning (Signed)
Pt currently active with [facility] for home Palliative services as confirmed by RNCM with Farrel Gordon, RN of [facility].  Pt will convert to home hospice services with [facility] if safe to travel.

## 2021-02-02 NOTE — ED Notes (Signed)
DNR bracelet placed on R wrist.

## 2021-02-02 NOTE — H&P (Signed)
History and Physical    Leah Velasquez:[id number] DOB: [birth year] DOA: 02/10/2021  PCP: [name], MD Consultants:  Sondra Come - rad onc; Mohamed - oncology; palliative care Patient coming from: [facility]; NOK: Daughter, [name], [phone]   Chief Complaint: SOB  HPI: Leah Velasquez is a 80 y.o. female with medical history significant of afib; HTN; HLD; lung cancer; and RA presenting for end of life care. [facility] was called this AM for admission and then that request was rescinded because the family requested home with hospice.  The patient was reasonably stable on recent family visits (6/4, 6/12?) but she worsened in the last few days.  They actually had a meeting yesterday to talk about transitioning to hospice but this had not happened yet.  She developed significant worsening overnight and the facility sent her in for evaluation.   ED Course: DNR/DNI from [facility] for respiratory distress.  Was supposed to be comfort care only and should not have been transferred.  Dr. Hal Hope discussed with daughter and so plan was for transfer back to facility.  Now, transitioned to HFNC and not safe enough to transport.  Daughter agrees admission for comfort measures, transfer to hospice if she stabilizes.     Review of Systems: Unable to perform    COVID Vaccine Status:   Complete  Past Medical History:  Diagnosis Date   GERD (gastroesophageal reflux disease)    Hemorrhoids    Hyperlipidemia    Hypertension    Lung cancer ([place]) 2000   RLL   Persistent atrial fibrillation (HCC)    Rheumatoid arthritis ([place])     Past Surgical History:  Procedure Laterality Date   BREAST EXCISIONAL BIOPSY Left    x 2   BREAST EXCISIONAL BIOPSY Right    BRONCHIAL BIOPSY  02/18/2020   Procedure: BRONCHIAL BIOPSIES;  Surgeon: Collene Gobble, MD;  Location: [place];  Service: Cardiopulmonary;;   BRONCHIAL BRUSHINGS  02/18/2020   Procedure: BRONCHIAL BRUSHINGS;  Surgeon: Collene Gobble,  MD;  Location: [place];  Service: Cardiopulmonary;;   BRONCHIAL NEEDLE ASPIRATION BIOPSY  02/18/2020   Procedure: BRONCHIAL NEEDLE ASPIRATION BIOPSIES;  Surgeon: Collene Gobble, MD;  Location: [place];  Service: Cardiopulmonary;;   BRONCHIAL WASHINGS  02/18/2020   Procedure: BRONCHIAL WASHINGS;  Surgeon: Collene Gobble, MD;  Location: [place];  Service: Cardiopulmonary;;   CARDIOVERSION N/A 01/09/2018   Procedure: CARDIOVERSION;  Surgeon: Josue Hector, MD;  Location: [facility] ENDOSCOPY;  Service: Cardiovascular;  Laterality: N/A;   CARDIOVERSION N/A 03/01/2018   Procedure: CARDIOVERSION;  Surgeon: Sueanne Margarita, MD;  Location: [facility] ENDOSCOPY;  Service: Cardiovascular;  Laterality: N/A;   ELECTROMAGNETIC NAVIGATION BROCHOSCOPY N/A 02/18/2020   Procedure: ELECTROMAGNETIC NAVIGATION BRONCHOSCOPY;  Surgeon: Collene Gobble, MD;  Location: [place];  Service: Cardiopulmonary;  Laterality: N/A;   KNEE SURGERY     LUNG LOBECTOMY Right 2000   RLL removed   VIDEO BRONCHOSCOPY N/A 02/18/2020   Procedure: VIDEO BRONCHOSCOPY WITH FLUORO;  Surgeon: Collene Gobble, MD;  Location: [place];  Service: Cardiopulmonary;  Laterality: N/A;   VIDEO BRONCHOSCOPY WITH ENDOBRONCHIAL ULTRASOUND N/A 04/22/2020   Procedure: VIDEO BRONCHOSCOPY WITH ENDOBRONCHIAL ULTRASOUND;  Surgeon: [name], MD;  Location: [facility] OR;  Service: Thoracic;  Laterality: N/A;    Social History   Socioeconomic History   Marital status: Married    Spouse name: Not on file   Number of children: Not on file   Years of education:  Not on file   Highest education level: Not on file  Occupational History   Not on file  Tobacco Use   Smoking status: Former    Pack years: 0.00   Smokeless tobacco: Never  Vaping Use   Vaping Use: Never used  Substance and Sexual Activity   Alcohol use: No   Drug use: No   Sexual activity: Not on file  Other Topics Concern   Not on file  Social History Narrative   ** Merged  History Encounter **       ** Merged History Encounter **       Social Determinants of Health   Financial Resource Strain: Not on file  Food Insecurity: Not on file  Transportation Needs: Not on file  Physical Activity: Not on file  Stress: Not on file  Social Connections: Not on file  Intimate Partner Violence: Not on file    Allergies  Allergen Reactions   Morphine Anaphylaxis   Morphine And Related Other (See Comments)    "vitals go down", pass out   Sulfa Antibiotics Rash    History reviewed. No pertinent family history.  Prior to Admission medications   Medication Sig Start Date End Date Taking? Authorizing Provider  acetaminophen (TYLENOL) 325 MG tablet Take 650 mg by mouth every 6 (six) hours as needed for mild pain.   Yes [provider]  acetaminophen (TYLENOL) 500 MG tablet Take 500 mg by mouth 2 (two) times daily as needed. For left knee pain   Yes [provider]  bisacodyl (DULCOLAX) 10 MG suppository Place 10 mg rectally See admin instructions. If not relieved by MOM give 10mg  Bisacodyl supp. Rectally for x1 dose in 24 hours prn constipation    Yes [provider]  diclofenac Sodium (VOLTAREN) 1 % GEL Apply 4 g topically 4 (four) times daily. To bilateral knees and ankles 04/24/20  Yes Reed, Tiffany L, DO  gabapentin (NEURONTIN) 100 MG capsule Take 100 mg by mouth at bedtime.   Yes [provider]  levocetirizine (XYZAL) 5 MG tablet Take 5 mg by mouth daily.  01/28/20  Yes [provider]  magnesium hydroxide (MILK OF MAGNESIA) 400 MG/5ML suspension Take 30 mLs by mouth daily as needed for mild constipation.   Yes [provider]  metoprolol succinate (TOPROL-XL) 25 MG 24 hr tablet Take 12.5 mg by mouth at bedtime.  12/18/19  Yes [provider]  mirtazapine (REMERON) 7.5 MG tablet Take 7.5 mg by mouth at bedtime.   Yes [provider]  MYRBETRIQ 25 MG TB24 tablet Take 25 mg by mouth daily. 06/09/20   Yes [provider]  simvastatin (ZOCOR) 20 MG tablet Take 20 mg by mouth at bedtime.   Yes [provider]  Sodium Phosphates (RA SALINE ENEMA RE) Place 1 Container rectally See admin instructions. If not relieved by bisacodyl supp. Give disposable saline enema rectally x1 dose 24 hours prn constipation    Yes [provider]  timolol (TIMOPTIC) 0.5 % ophthalmic solution Place 1 drop into both eyes at bedtime.  11/03/19  Yes [provider]  vitamin B-12 1000 MCG tablet Take 1 tablet (1,000 mcg total) by mouth daily. 02/20/20  Yes Andrew Au, MD  warfarin (COUMADIN) 2.5 MG tablet Take 2.5 mg by mouth See admin instructions. Tuesday,Wednesday,Friday,Saturday and sunday   Yes [provider]  warfarin (COUMADIN) 3 MG tablet Take 3 mg by mouth See admin instructions. Monday and thursday   Yes [provider]  OXYGEN Inhale 2 L into the lungs continuous. Patient not taking: No sig reported 07/28/20   [provider]  potassium chloride (KLOR-CON) 10 MEQ tablet Take 10 mEq by mouth See admin instructions. Qd x 5 days Patient not taking: No sig reported    [provider]    Physical Exam: Vitals:   01/29/2021 1215 02/01/2021 1230 02/18/2021 1300 02/09/2021 1315  BP:  (!) 138/99 137/72   Pulse: 62 63  (!) 121  Resp: (!) 24 (!) 28 (!) 25 (!) 25  Temp:      TempSrc:      SpO2: 100% 100%  100%  Weight:      Height:         General:  Opens eyes, smiles, nods some Eyes:    normal lids, iris ENT:  mildly dry mm Neck:  no LAD, masses or thyromegaly Cardiovascular:  Irregularly irregular with tachycardia, no m/r/g. No LE edema.  Respiratory:   Diffuse rhonchi.  Mildly to moderately increased respiratory effort. Abdomen:  soft, diffusely mildly TTP (?), ND Skin:  no rash or induration seen on limited exam Musculoskeletal:  mild L hand edema (improving), no bony abnormality Psychiatric:  alert, pleasant, minimally  interactive Neurologic:  unable to perform    Radiological Exams on Admission: Independently reviewed - see discussion in A/P where applicable  DG Chest Port 1 View  Result Date: 01/22/2021 CLINICAL DATA:  Shortness of breath EXAM: PORTABLE CHEST 1 VIEW COMPARISON:  Radiograph 04/16/2020, CT 11/02/2020 FINDINGS: Redemonstration of pleural thickening in the right lung apex in a region of a previously seen pleural based malignancy comparison imaging. Postsurgical changes are noted in the right hilum and mid lung. Some consolidative opacities present in the right lung base partially silhouettes the right hemidiaphragm and right heart border. More diffuse gradient of hazy opacity in the mid to lower lung likely reflecting atelectatic changes as well. The aorta is calcified. The remaining cardiomediastinal contours are unremarkable. No acute osseous or soft tissue abnormality. Degenerative changes are present in the imaged spine and shoulders. Telemetry leads overlie the chest. IMPRESSION: Nonspecific pleural thickening of the right lung apex in a region of a previously seen pleural based malignancy. Consolidative opacity in the right mid to lower lung partially silhouetting the right heart border and right hemidiaphragm. Could reflect acute airspace disease or other pleuroparenchymal process. Given patient history and new findings, consider cross-sectional imaging. Additional dependent atelectatic features. Postsurgical changes in the right lung. Aortic Atherosclerosis (ICD10-I70.0). Electronically Signed   By: Lovena Le M.D.   On: 02/05/2021 03:57    EKG: Independently reviewed.  Afib with rate 135; prolonged QTc 522; nonspecific ST changes with no evidence of acute ischemia   Labs on Admission: I have personally reviewed the available labs and imaging studies at the time of the admission.  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[place])   Acute respiratory failure with hypoxemia ([place])    -Patient presenting with respiratory distress, thought to be related to lung cancer and/or PNA -Family preferred in-facility hospice but  patient was transported to ER -No longer on BIPAP but still on HFNC and unstable for transport currently -Will admit to [facility] for comfort care and palliative care consult -Patient is likely to be a candidate for [employer] or other residential hospice if she does not suffer in-hospital demise -Comfort care order set utilized -Will hold home medications -Pain control with fentanyl drip    DVT prophylaxis: None - comfort measures Code Status: DNR - confirmed with family Family Communication: Step-daughter was present throughout evaluation Disposition Plan: Anticipate in-hospital death Consults called: Palliative care Admission status: Admit - It is my clinical opinion that admission to INPATIENT is reasonable and necessary because of the expectation that this patient will require hospital care that crosses at least 2 midnights to treat this condition based on the medical complexity of the problems presented.  Given the aforementioned information, the predictability of an adverse outcome is felt to be significant.     Karmen Bongo MD [facility]   How to contact the [facility] Attending or Consulting provider [place] or covering provider during after hours [place], for this patient?  Check the care team in [facility] and look for a) attending/consulting [facility] provider listed and b) the [facility] team listed Log into [email or web address] and use  [place]'s universal password to access. If you do not have the password, please contact the hospital operator. Locate the [facility] provider you are looking for under [facility] and page to a number that you can be directly reached. If you still have difficulty reaching the provider, please page the [facility] (Director on Call) for the Hospitalists listed on amion for assistance.   01/27/2021, 3:24 PM

## 2021-02-02 NOTE — Consult Note (Signed)
Consultation Note Date: 02/09/2021   Patient Name: Leah Velasquez  DOB: 07-13-1941  MRN: [id number]  Age / Sex: 80 y.o., female  PCP: [name], MD Referring Physician: Sharlett Iles, MD  Reason for Consultation: Establishing goals of care "end of life care"  HPI/Patient Profile: 80 y.o. 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 She was placed on BiPAP in the ED. Was supposedly comfort care at [facility] and should not have been transferred. Family was initially requesting patient return to SNF [facility]) with comfort measures, and so [facility] initially declined admission.  PMT was consulted to assist with [place] and disposition.   Clinical Assessment and Goals of Care: I have reviewed medical records including EPIC notes, labs and imaging, and discussed with ED provider Dr. Rex Kras. Based on family wishes, I recommended transitioning from bipap to high flow nasal cannula.   Examined the patient at bedside. She is somnolent but opens her eyes to voice. Currently tolerating HFNC at 15 liters/min.  Step-daughter [name]) is at bedside. Discussed that patient was not stable for transport back to SNF. Recommended admission to hospital for comfort care. [name] agrees but states that we need to contact patient's daughter Leah Velasquez.   I spoke with Leah Velasquez by phone to discuss diagnosis, prognosis, GOC, EOL wishes, disposition, and options. 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 She is her mother's HCPOA. I introduced Palliative Medicine as specialized medical care for people living with serious illness. It focuses on providing relief from the symptoms and stress of a serious illness.   We discussed a brief life review of the patient. She  was born and raised in [place]. She had 4 children with her first husband. At some point, moved to [place] which is where she met her 2nd husband Leah Velasquez) who ended up as Leah Velasquez's stepfather when she was 19 years old. Patient and her husband Leah Velasquez relocated to [facility] about 4 years ago to be closer to [place].   Within the past year, patient's health has declined and she required transition to SNF. She resides at [facility]. She requires assistance with all ADLs. She requires hoyer lift for all transfers.   We discussed her current illness and what it means in the larger context of her ongoing co-morbidities.  Natural disease trajectory at EOL was discussed. Patient with right upper lobe lung mass and mediastinal lymphadenopathy diagnosed June 2021; was treated with systemic chemotherapy. Chest CT scan 11/02/2020 which showed marked disease progression with enlargement of right upper pulmonary nodules and new progressive thoracic adenopathy. MRI in March 2022 showed metastasis to brain. Patient was referred to radiation oncology and received radiation to brain and chest region. She was not a candidate for concomitant chemotherapy. She received final radiation treatment on 12/24/20.  Discussed the concept of a comfort path with Leah Velasquez, emphasizing it involves allowing  a natural course to occur. Discussed that the goal is comfort and dignity rather than cure/prolonging life. Dicussed that unfortunately patient is not stable for transfer back to SNF. Recommended transitioning to comfort care while in house - keeping her clean and dry, no labs, no artificial hydration or feeding, no antibiotics, minimizing of medications, comfort feeds (if appropriate), and medication for pain and dyspnea.   Leah Velasquez agrees to admission for full comfort measures. Discussed potential transfer to residential hospice tomorrow if she is stable enough. Leah Velasquez expresses appreciation for PMT support.   Questions and concerns were addressed.   PMT contact info provided and Leah Velasquez was encouraged to call with questions or concerns.    Primary decision maker: daughter Leah Velasquez Education officer, community)    SUMMARY OF RECOMMENDATIONS   Full comfort measures initiated DNR/DNI as previously documented Will need to admit for comfort care Transfer to [facility] when bed becomes available - TOC consult placed and hospice liaison notifed Added orders for symptom management at EOL  Unrestricted visitation  Provide frequent assessments and administer PRN medications as clinically necessary to ensure EOL comfort PMT will continue to follow holistically  Symptom Management:  Fentanyl infusion  Lorazepam (ATIVAN) prn for anxiety Haloperidol (HALDOL) prn for agitation  Glycopyrrolate (ROBINUL) for excessive secretions Ondansetron (ZOFRAN) prn for nausea Polyvinyl alcohol (LIQUIFILM TEARS) prn for dry eyes Antiseptic oral rinse (BIOTENE) prn for dry mouth   Code Status/Advance Care Planning: DNR  Palliative Prophylaxis:  Oral Care and Turn Reposition  Additional Recommendations (Limitations, Scope, Preferences): Full Comfort Care  Psycho-social/Spiritual:  Created space and opportunity for family to express thoughts and feelings regarding patient's current medical situation.  Emotional support provided   Prognosis:  Hours - days  Discharge Planning: residential hospice versus hospital death      Primary Diagnoses: Present on Admission:  Acute respiratory failure with hypoxemia ([place])   I have reviewed the medical record, interviewed the patient and family, and examined the patient. The following aspects are pertinent.  Past Medical History:  Diagnosis Date   A-fib ([place])    Dysrhythmia    GERD (gastroesophageal reflux disease)    Hemorrhoids    Hyperlipidemia    Hypertension    Lung cancer ([place]) 2000   RLL   Obesity    Persistent atrial fibrillation (HCC)    Rheumatoid arthritis ([place])      No family history on file. Scheduled  Meds: Continuous Infusions:  fentaNYL infusion INTRAVENOUS     PRN Meds:.acetaminophen **OR** acetaminophen, antiseptic oral rinse, fentaNYL, glycopyrrolate **OR** glycopyrrolate **OR** glycopyrrolate, haloperidol **OR** haloperidol **OR** haloperidol lactate, LORazepam **OR** LORazepam **OR** LORazepam, ondansetron **OR** ondansetron (ZOFRAN) IV, polyvinyl alcohol Medications Prior to Admission:  Prior to Admission medications   Medication Sig Start Date End Date Taking? Authorizing Provider  acetaminophen (TYLENOL) 325 MG tablet Take 650 mg by mouth every 6 (six) hours as needed for mild pain.   Yes [provider]  acetaminophen (TYLENOL) 500 MG tablet Take 500 mg by mouth 2 (two) times daily as needed. For left knee pain   Yes [provider]  bisacodyl (DULCOLAX) 10 MG suppository Place 10 mg rectally See admin instructions. If not relieved by MOM give $Remove'10mg'[id number]$  Bisacodyl supp. Rectally for x1 dose in 24 hours prn constipation    Yes [provider]  diclofenac Sodium (VOLTAREN) 1 % GEL Apply 4 g topically 4 (four) times daily. To bilateral knees and ankles 04/24/20  Yes Reed, Tiffany L, DO  gabapentin (NEURONTIN) 100 MG capsule Take  100 mg by mouth at bedtime.   Yes [provider]  levocetirizine (XYZAL) 5 MG tablet Take 5 mg by mouth daily.  01/28/20  Yes [provider]  magnesium hydroxide (MILK OF MAGNESIA) 400 MG/5ML suspension Take 30 mLs by mouth daily as needed for mild constipation.   Yes [provider]  metoprolol succinate (TOPROL-XL) 25 MG 24 hr tablet Take 12.5 mg by mouth at bedtime.  12/18/19  Yes [provider]  mirtazapine (REMERON) 7.5 MG tablet Take 7.5 mg by mouth at bedtime.   Yes [provider]  MYRBETRIQ 25 MG TB24 tablet Take 25 mg by mouth daily. 06/09/20  Yes [provider]  simvastatin (ZOCOR) 20 MG tablet Take 20 mg by mouth at bedtime.   Yes [provider]  Sodium Phosphates (RA  SALINE ENEMA RE) Place 1 Container rectally See admin instructions. If not relieved by bisacodyl supp. Give disposable saline enema rectally x1 dose 24 hours prn constipation    Yes [provider]  timolol (TIMOPTIC) 0.5 % ophthalmic solution Place 1 drop into both eyes at bedtime.  11/03/19  Yes [provider]  vitamin B-12 1000 MCG tablet Take 1 tablet (1,000 mcg total) by mouth daily. 02/20/20  Yes Andrew Au, MD  warfarin (COUMADIN) 2.5 MG tablet Take 2.5 mg by mouth See admin instructions. Tuesday,Wednesday,Friday,Saturday and sunday   Yes [provider]  warfarin (COUMADIN) 3 MG tablet Take 3 mg by mouth See admin instructions. Monday and thursday   Yes [provider]  OXYGEN Inhale 2 L into the lungs continuous. Patient not taking: No sig reported 07/28/20   [provider]  potassium chloride (KLOR-CON) 10 MEQ tablet Take 10 mEq by mouth See admin instructions. Qd x 5 days Patient not taking: No sig reported    [provider]   Allergies  Allergen Reactions   Morphine Anaphylaxis   Morphine And Related Other (See Comments)    "vitals go down", pass out   Sulfa Antibiotics Rash   Review of Systems  Unable to perform ROS: Patient unresponsive   Physical Exam Vitals reviewed.  Constitutional:      Appearance: She is ill-appearing.     Comments: obtunded  Cardiovascular:     Rate and Rhythm: Tachycardia present.  Pulmonary:     Effort: Tachypnea and accessory muscle usage present.  Neurological:     Comments: Minimally responsive Does not follow commands    Vital Signs: BP 137/72   Pulse (!) 121   Temp 98 F (36.7 C) (Rectal)   Resp (!) 25   Ht $R'5\' 2"'rp$  (1.575 m)   Wt 76.8 kg   SpO2 100%   BMI 30.97 kg/m  Pain Scale: Faces       SpO2: SpO2: 100 % O2 Device:SpO2: 100 % O2 Flow Rate: .O2 Flow Rate (L/min): (S) 15 L/min  IO: Intake/output summary:  Intake/Output Summary (Last 24 hours) at 02/01/2021  1322 Last data filed at 02/06/2021 0728 Gross per 24 hour  Intake 372.42 ml  Output --  Net 372.42 ml    LBM:   Baseline Weight: Weight: 76.8 kg Most recent weight: Weight: 76.8 kg      Palliative Assessment/Data: PPS 10%     Time In: 1300 Time Out: 1412 Time Total: 72 minutes Greater than 50%  of this time was spent counseling and coordinating care related to the above assessment and plan.  Signed by: Lavena Bullion, NP   Please contact  Palliative Medicine Team phone at [phone] for questions and concerns.  For individual provider: See Shea Evans

## 2021-02-02 NOTE — ED Notes (Signed)
Pt given warm blankets. Family updated that we are waiting on palliative care MD to come see pt. L hand w/ pitting edema. Family concerned about ring cutting into finger. Pt's hand elevated at this time and BP cuff moved to R arm to help facilitate decreased swelling to prevent ring from causing pain.

## 2021-02-02 NOTE — ED Provider Notes (Signed)
I was contacted by daytime [facility] hospitalist regarding this patient. The overnight team, Quincy Carnes and Dr. Christy Gentles, had admitted to [facility] for comfort care measures w/ respiratory failure. Daughter had voiced that they did not want her to be admitted. However, patient is still requiring bipap and is not stable for transport back to her facility.  She is somnolent on assessment but without respiratory distress.  I contacted palliative care and had multiple conversations with Placido Sou, NP.  She has spoken with the daughter and we agreed to transition the patient to high flow nasal cannula.  Observed the patient for a while after transition and she has remained the same from a respiratory standpoint.  Daughter ultimately agreed to admission for stabilization and palliative care measures and patient may be eligible for discharge back to hospice if her respiratory standpoint stabilizes.  Discussed admission with [facility], Dr. Lorin Mercy. I appreciate her assistance.   CRITICAL CARE Performed by: Wenda Overland Jaxten Brosh   Total critical care time: 75 minutes  Critical care time was exclusive of separately billable procedures and treating other patients.  Critical care was necessary to treat or prevent imminent or life-threatening deterioration.  Critical care was time spent personally by me on the following activities: development of treatment plan with patient and/or surrogate as well as nursing, discussions with consultants, evaluation of patient's response to treatment, examination of patient, obtaining history from patient or surrogate, ordering and performing treatments and interventions, ordering and review of laboratory studies, ordering and review of radiographic studies, pulse oximetry and re-evaluation of patient's condition.    Andyn Sales, Wenda Overland, MD 02/16/2021 1505

## 2021-02-02 NOTE — ED Notes (Signed)
Spoke

## 2021-02-02 NOTE — ED Notes (Signed)
Family at bedside. 

## 2021-02-02 NOTE — ED Notes (Signed)
Pt repositioned for comfort. Pillow adjusted. Pt not talking but nodding appropriately.

## 2021-02-02 NOTE — ED Notes (Signed)
Attempted report x 2 

## 2021-02-02 NOTE — ED Notes (Signed)
Pg [facility] for Dr. Rex Kras

## 2021-02-02 NOTE — ED Notes (Signed)
Pt transitioned to salter at 15L. Pt tolerating well at this time.

## 2021-02-02 NOTE — ED Notes (Signed)
Attempted report x1. 

## 2021-02-02 NOTE — Progress Notes (Signed)
Manufacturing engineer (ACC) Community Based Palliative Care     This patient is currently enrolled in our palliative care services in the community.  Family has requested hospice support in her LTC facility. Patient is not stable to return to [facility] at this time. If patient becomes stable, family would like for her to transfer to [facility]. [place] liaison will follow up tomorrow.    If you have questions or need assistance, please call [phone] or contact the hospital Liaison listed on AMION.      Farrel Gordon, RN, [facility] Liaison  [phone]

## 2021-02-02 NOTE — Telephone Encounter (Signed)
Received call from patient's daughter Jackelyn Poling. She states patient was taken to hospital this morning. Family would like for patient to return to [facility] with comfort measures, start hospice as soon as possible. Will reach out to hospital liaison regarding family wishes.

## 2021-02-02 NOTE — Progress Notes (Addendum)
This chaplain responded to PMT consult for EOL spiritual care.  The chaplain is appreciative of the RN-Emily support.   The chaplain understands the Pt. step-daughter-Sharon has stepped way from the bedside for a minute.  The RN will update the chaplain when [name] returns.   [place] [phone]  **[phone]  This chaplain introduced herself to the Pt. step daughter-Sharon.  [name] offers herself as peaceful presence at the bedside, as the Pt. daughter-Debbie is making medical decisions from a distance.    The Pt. appears to be comfortable and is able to respond to simple yes or no questions with a nod.  The chaplain understands [name] is updating the Pt. husband as needed. The chaplain learned the Pt. is Leah Velasquez, but without a faith community.  Family is important to the Pt. as reflected in the times they chose to spend together.  [name] accepted F/U spiritual care as needed.

## 2021-02-03 ENCOUNTER — Ambulatory Visit (HOSPITAL_COMMUNITY): Payer: Medicare HMO

## 2021-02-03 ENCOUNTER — Inpatient Hospital Stay: Payer: Medicare HMO

## 2021-02-03 DIAGNOSIS — M069 Rheumatoid arthritis, unspecified: Secondary | ICD-10-CM

## 2021-02-03 DIAGNOSIS — D696 Thrombocytopenia, unspecified: Secondary | ICD-10-CM

## 2021-02-03 DIAGNOSIS — I4819 Other persistent atrial fibrillation: Secondary | ICD-10-CM

## 2021-02-03 DIAGNOSIS — E782 Mixed hyperlipidemia: Secondary | ICD-10-CM

## 2021-02-03 DIAGNOSIS — R531 Weakness: Secondary | ICD-10-CM

## 2021-02-03 DIAGNOSIS — I4891 Unspecified atrial fibrillation: Secondary | ICD-10-CM

## 2021-02-03 DIAGNOSIS — Z66 Do not resuscitate: Secondary | ICD-10-CM | POA: Diagnosis present

## 2021-02-03 DIAGNOSIS — I10 Essential (primary) hypertension: Secondary | ICD-10-CM

## 2021-02-03 DIAGNOSIS — C3411 Malignant neoplasm of upper lobe, right bronchus or lung: Secondary | ICD-10-CM

## 2021-02-03 DIAGNOSIS — E876 Hypokalemia: Secondary | ICD-10-CM

## 2021-02-03 DIAGNOSIS — C3491 Malignant neoplasm of unspecified part of right bronchus or lung: Secondary | ICD-10-CM

## 2021-02-03 MED ORDER — FENTANYL BOLUS VIA INFUSION
50.0000 ug | INTRAVENOUS | Status: DC | PRN
  Administered 2021-02-03: 50 ug via INTRAVENOUS
  Filled 2021-02-03: qty 50

## 2021-02-03 MED ORDER — LORAZEPAM 2 MG/ML IJ SOLN
2.0000 mg | INTRAMUSCULAR | Status: DC | PRN
  Administered 2021-02-03: 2 mg via INTRAVENOUS
  Filled 2021-02-03: qty 1

## 2021-02-04 ENCOUNTER — Ambulatory Visit: Payer: Medicare HMO | Admitting: Radiation Oncology

## 2021-02-04 ENCOUNTER — Ambulatory Visit: Payer: Medicare HMO | Admitting: Internal Medicine

## 2021-02-10 ENCOUNTER — Ambulatory Visit: Payer: Medicare HMO | Admitting: Internal Medicine

## 2021-02-19 NOTE — Death Summary Note (Signed)
DEATH SUMMARY   Patient Details  Name: Leah Velasquez MRN: [id number] DOB: 12-06-1940  Admission/Discharge Information   Admit Date:  Feb 15, 2021  Date of Death: Date of Death: 02-16-21  Time of Death: Time of Death: 11/11/33  Length of Stay: 1  Referring Physician: [name], MD   Reason(s) for Hospitalization  Uncontrolled symptoms in hospice patient admitted for end of life care  Diagnoses  Preliminary cause of death: Pneumonia Secondary Diagnoses (including complications and co-morbidities):  Principal Problem:   Admission for end of life care Active Problems:   Persistent atrial fibrillation (HCC)   Generalized weakness   Essential hypertension   Thrombocytopenia (HCC)   Hyperlipidemia   Non-small cell carcinoma of right lung, stage 3 ([place])   Small cell lung cancer, right upper lobe (HCC)   Rheumatoid arthritis ([place])   Goals of care, counseling/discussion   Hypokalemia   Primary malignant neoplasm of lung with metastasis to brain [facility])   Acute respiratory failure with hypoxemia ([place])   DNR (do not resuscitate)   Atrial fibrillation with RVR [facility])   [place] [facility] Course (including significant findings, care, treatment, and services provided and events leading to death)  Leah Velasquez is a 80 y.o. female with a history of lung cancer, HTN, HLD, AFib, RA on home hospice/palliative who presented to the ED from [place] with respiratory distress. Work up revealed right mid-upper lung pneumonia on chronic pleural changes, demand ischemia (troponin 26), thrombocytopenia (platelets 140k), macrocytic anemia, acute kidney injury (Cr 1.5) hypoalbuminemia concerning for severe protein calorie malnutrition, hypernatremia, (Na 147), hypokalemia (3.2), coagulopathy (INR 6.6) and hypoxia. Later found to be in AFib with RVR. The hope was for transfer back to facility under hospice care, though the patient's respiratory compromise precluded transfer, so she was admitted for end of life  care. Fentanyl infusion at 54mcg/hr has been initiated with incomplete control of symptoms. This will be increased accordingly with continuation of prn bolus dosing of 59mcg q15 min prn. Oxygen has been weaned to 2L and can be further withdrawn once dyspnea resolved. The patient is not likely to stabilize enough to facilitate transfer, so hospital demise is anticipated. In the afternoon, the patient's husband arrived, and the patient passed peacefully shortly thereafter.  Pertinent Labs and Studies  Significant Diagnostic Studies DG Chest Port 1 View  Result Date: 02/15/21 CLINICAL DATA:  Shortness of breath EXAM: PORTABLE CHEST 1 VIEW COMPARISON:  Radiograph 04/16/2020, CT 11/02/2020 FINDINGS: Redemonstration of pleural thickening in the right lung apex in a region of a previously seen pleural based malignancy comparison imaging. Postsurgical changes are noted in the right hilum and mid lung. Some consolidative opacities present in the right lung base partially silhouettes the right hemidiaphragm and right heart border. More diffuse gradient of hazy opacity in the mid to lower lung likely reflecting atelectatic changes as well. The aorta is calcified. The remaining cardiomediastinal contours are unremarkable. No acute osseous or soft tissue abnormality. Degenerative changes are present in the imaged spine and shoulders. Telemetry leads overlie the chest. IMPRESSION: Nonspecific pleural thickening of the right lung apex in a region of a previously seen pleural based malignancy. Consolidative opacity in the right mid to lower lung partially silhouetting the right heart border and right hemidiaphragm. Could reflect acute airspace disease or other pleuroparenchymal process. Given patient history and new findings, consider cross-sectional imaging. Additional dependent atelectatic features. Postsurgical changes in the right lung. Aortic Atherosclerosis (ICD10-I70.0). Electronically Signed   By: Elwin Sleight.D.  On: 02/18/2021 03:57    Microbiology Recent Results (from the past 240 hour(s))  Resp Panel by RT-PCR (Flu A&B, Covid) Nasopharyngeal Swab     Status: None   Collection Time: 02/08/2021  3:39 AM   Specimen: Nasopharyngeal Swab; Nasopharyngeal(NP) swabs in vial transport medium  Result Value Ref Range Status   SARS Coronavirus 2 by RT PCR NEGATIVE NEGATIVE Final    Comment: (NOTE) SARS-CoV-2 target nucleic acids are NOT DETECTED.  The SARS-CoV-2 RNA is generally detectable in upper respiratory specimens during the acute phase of infection. The lowest concentration of SARS-CoV-2 viral copies this assay can detect is 138 copies/mL. A negative result does not preclude SARS-Cov-2 infection and should not be used as the sole basis for treatment or other patient management decisions. A negative result may occur with  improper specimen collection/handling, submission of specimen other than nasopharyngeal swab, presence of viral mutation(s) within the areas targeted by this assay, and inadequate number of viral copies(<138 copies/mL). A negative result must be combined with clinical observations, patient history, and epidemiological information. The expected result is Negative.  Fact Sheet for Patients:  EntrepreneurPulse.com.au  Fact Sheet for Healthcare Providers:  IncredibleEmployment.be  This test is no t yet approved or cleared by the [place] FDA and  has been authorized for detection and/or diagnosis of SARS-CoV-2 by FDA under an Emergency Use Authorization (EUA). This EUA will remain  in effect (meaning this test can be used) for the duration of the COVID-19 declaration under Section 564(b)(1) of the Act, 21 U.S.C.section 360bbb-3(b)(1), unless the authorization is terminated  or revoked sooner.       Influenza A by PCR NEGATIVE NEGATIVE Final   Influenza B by PCR NEGATIVE NEGATIVE Final    Comment: (NOTE) The Xpert Xpress  SARS-CoV-2/FLU/RSV plus assay is intended as an aid in the diagnosis of influenza from Nasopharyngeal swab specimens and should not be used as a sole basis for treatment. Nasal washings and aspirates are unacceptable for Xpert Xpress SARS-CoV-2/FLU/RSV testing.  Fact Sheet for Patients: EntrepreneurPulse.com.au  Fact Sheet for Healthcare Providers: IncredibleEmployment.be  This test is not yet approved or cleared by the [place] FDA and has been authorized for detection and/or diagnosis of SARS-CoV-2 by FDA under an Emergency Use Authorization (EUA). This EUA will remain in effect (meaning this test can be used) for the duration of the COVID-19 declaration under Section 564(b)(1) of the Act, 21 U.S.C. section 360bbb-3(b)(1), unless the authorization is terminated or revoked.  Performed at [facility], [place] [address], [place], [place] [address]     Lab Basic Metabolic Panel: Recent Labs  Lab 01/25/2021 0327 02/05/2021 0350  NA 144 147*  K 3.5 3.2*  CL 108  --   CO2 23  --   GLUCOSE 126*  --   BUN 48*  --   CREATININE 1.50*  --   CALCIUM 9.1  --    Liver Function Tests: Recent Labs  Lab 02/14/2021 0327  AST 19  ALT 15  ALKPHOS 88  BILITOT 0.8  PROT 6.2*  ALBUMIN 2.0*   No results for input(s): LIPASE, AMYLASE in the last 168 hours. No results for input(s): AMMONIA in the last 168 hours. CBC: Recent Labs  Lab 02/13/2021 0327 02/10/2021 0350  WBC 9.3  --   NEUTROABS 5.8  --   HGB 11.1* 15.3*  HCT 35.7* 45.0  MCV 110.5*  --   PLT 140*  --    Cardiac Enzymes: No results for input(s):  CKTOTAL, CKMB, CKMBINDEX, TROPONINI in the last 168 hours. Sepsis Labs: Recent Labs  Lab 02/08/2021 0327  WBC 9.3    Procedures/Operations  None   Patrecia Pour, MD 02/04/2021, 6:02 PM

## 2021-02-19 NOTE — Progress Notes (Signed)
This chaplain is present for F/U spiritual care with the Pt. family.  The Pt. step daughter-Leah Velasquez is at the Pt. bedside.  The chaplain recognizes Leah Velasquez's appreciation of PMT NP-Kasie Mahan Pt. symptom check.  The chaplain understands Leah Velasquez will request to be updated of any changes, in addition to [facility], because Leah Velasquez is local to the [place] area and the daughter of the Pt. husband.   The chaplain listened as Leah Velasquez asked questions anticipating her role in guiding the Pt. husband in the upcoming end of life decisions, i.e.. choice of a funeral home.    This chaplain is available for F/U spiritual care as needed, [phone].

## 2021-02-19 NOTE — Progress Notes (Addendum)
Patient expired at 1535 with family at bedside. MD , Palliative care, patient placement and [facility] notified. Saline moisten guaze placed over eyes   Yarlin Breisch, Tivis Ringer, RN

## 2021-02-19 NOTE — Progress Notes (Signed)
PROGRESS NOTE  Leah Velasquez  RXV:[id number] DOB: Jan 09, 1941 DOA: 02/16/2021 PCP: [name], MD   Brief Narrative: Leah Velasquez is a 80 y.o. female with a history of lung cancer, HTN, HLD, AFib, RA on home hospice/palliative who presented to the ED from [place] with respiratory distress. Work up revealed right mid-upper lung pneumonia on chronic pleural changes, demand ischemia (troponin 26), thrombocytopenia (platelets 140k), macrocytic anemia, acute kidney injury (Cr 1.5) hypoalbuminemia concerning for severe protein calorie malnutrition, hypernatremia, (Na 147), hypokalemia (3.2), coagulopathy (INR 6.6) and hypoxia. Later found to be in AFib with RVR. The hope was for transfer back to facility under hospice care, though the patient's respiratory compromise precluded transfer, so she was admitted for end of life care. Fentanyl infusion at 56mcg/hr has been initiated with incomplete control of symptoms. This will be increased accordingly with continuation of prn bolus dosing of 45mcg q15 min prn. Oxygen has been weaned to 2L and can be further withdrawn once dyspnea resolved. The patient is not likely to stabilize enough to facilitate transfer, so hospital demise is anticipated.   Assessment & Plan: Principal Problem:   Admission for end of life care Active Problems:   Persistent atrial fibrillation (HCC)   Generalized weakness   Essential hypertension   Thrombocytopenia (HCC)   Hyperlipidemia   Non-small cell carcinoma of right lung, stage 3 (HCC)   Small cell lung cancer, right upper lobe (HCC)   Rheumatoid arthritis (HCC)   Goals of care, counseling/discussion   Hypokalemia   Primary malignant neoplasm of lung with metastasis to brain ([place])   Acute respiratory failure with hypoxemia ([employer])   DNR (do not resuscitate)  Subjective: Pt shakes head no when asked if she is in pain. She is breathing quickly and does appear to be in some respiratory distress.   Objective: Vitals:    02/07/2021 1700 02/04/2021 1715 02/14/2021 1744 21-Feb-2021 0531  BP:   114/71 (!) 128/106  Pulse:    (!) 123  Resp: (!) 23 (!) 21  (!) 22  Temp:    98.3 F (36.8 C)  TempSrc:    Oral  SpO2: 100% 100% 98% (!) 87%  Weight:      Height:        Intake/Output Summary (Last 24 hours) at 02/21/2021 1137 Last data filed at 02/21/21 0900 Gross per 24 hour  Intake 34.86 ml  Output 200 ml  Net -165.14 ml   Filed Weights   02/12/2021 0325  Weight: 76.8 kg    Gen: Elderly frail ill-appearing female Pulm: Tachypneic without wheezes or stridor  CV: Irreg tachycardia. No murmur, rub, or gallop.   GI: Abdomen soft, non-tender, non-distended, with normoactive bowel sounds. No organomegaly or masses felt. Ext: Warm, damp. With no deformities Skin: No wounds on visualized skin Neuro: Alert, unable to determine further Psych: UTD  Data Reviewed: I have personally reviewed following labs and imaging studies  CBC: Recent Labs  Lab 01/28/2021 0327 01/21/2021 0350  WBC 9.3  --   NEUTROABS 5.8  --   HGB 11.1* 15.3*  HCT 35.7* 45.0  MCV 110.5*  --   PLT 140*  --    Basic Metabolic Panel: Recent Labs  Lab 02/12/2021 0327 01/28/2021 0350  NA 144 147*  K 3.5 3.2*  CL 108  --   CO2 23  --   GLUCOSE 126*  --   BUN 48*  --   CREATININE 1.50*  --   CALCIUM 9.1  --  GFR: Estimated Creatinine Clearance: 29.7 mL/min (A) (by C-G formula based on SCr of 1.5 mg/dL (H)). Liver Function Tests: Recent Labs  Lab 02/17/2021 0327  AST 19  ALT 15  ALKPHOS 88  BILITOT 0.8  PROT 6.2*  ALBUMIN 2.0*   No results for input(s): LIPASE, AMYLASE in the last 168 hours. No results for input(s): AMMONIA in the last 168 hours. Coagulation Profile: Recent Labs  Lab 02/15/2021 0327  INR 6.6*   Cardiac Enzymes: No results for input(s): CKTOTAL, CKMB, CKMBINDEX, TROPONINI in the last 168 hours. BNP (last 3 results) No results for input(s): PROBNP in the last 8760 hours. HbA1C: No results for input(s): HGBA1C in  the last 72 hours. CBG: No results for input(s): GLUCAP in the last 168 hours. Lipid Profile: No results for input(s): CHOL, HDL, LDLCALC, TRIG, CHOLHDL, LDLDIRECT in the last 72 hours. Thyroid Function Tests: No results for input(s): TSH, T4TOTAL, FREET4, T3FREE, THYROIDAB in the last 72 hours. Anemia Panel: No results for input(s): VITAMINB12, FOLATE, FERRITIN, TIBC, IRON, RETICCTPCT in the last 72 hours. Urine analysis:    Component Value Date/Time   COLORURINE YELLOW 05/11/2020 1957   APPEARANCEUR CLOUDY (A) 05/11/2020 1957   LABSPEC 1.010 05/11/2020 1957   PHURINE 7.0 05/11/2020 1957   GLUCOSEU NEGATIVE 05/11/2020 1957   HGBUR MODERATE (A) 05/11/2020 [place] NEGATIVE 05/11/2020 [place] NEGATIVE 05/11/2020 1957   PROTEINUR 30 (A) 05/11/2020 1957   NITRITE NEGATIVE 05/11/2020 1957   LEUKOCYTESUR LARGE (A) 05/11/2020 1957   Recent Results (from the past 240 hour(s))  Resp Panel by RT-PCR (Flu A&B, Covid) Nasopharyngeal Swab     Status: None   Collection Time: 01/29/2021  3:39 AM   Specimen: Nasopharyngeal Swab; Nasopharyngeal(NP) swabs in vial transport medium  Result Value Ref Range Status   SARS Coronavirus 2 by RT PCR NEGATIVE NEGATIVE Final    Comment: (NOTE) SARS-CoV-2 target nucleic acids are NOT DETECTED.  The SARS-CoV-2 RNA is generally detectable in upper respiratory specimens during the acute phase of infection. The lowest concentration of SARS-CoV-2 viral copies this assay can detect is 138 copies/mL. A negative result does not preclude SARS-Cov-2 infection and should not be used as the sole basis for treatment or other patient management decisions. A negative result may occur with  improper specimen collection/handling, submission of specimen other than nasopharyngeal swab, presence of viral mutation(s) within the areas targeted by this assay, and inadequate number of viral copies(<138 copies/mL). A negative result must be combined  with clinical observations, patient history, and epidemiological information. The expected result is Negative.  Fact Sheet for Patients:  EntrepreneurPulse.com.au  Fact Sheet for Healthcare Providers:  IncredibleEmployment.be  This test is no t yet approved or cleared by the [place] FDA and  has been authorized for detection and/or diagnosis of SARS-CoV-2 by FDA under an Emergency Use Authorization (EUA). This EUA will remain  in effect (meaning this test can be used) for the duration of the COVID-19 declaration under Section 564(b)(1) of the Act, 21 U.S.C.section 360bbb-3(b)(1), unless the authorization is terminated  or revoked sooner.       Influenza A by PCR NEGATIVE NEGATIVE Final   Influenza B by PCR NEGATIVE NEGATIVE Final    Comment: (NOTE) The Xpert Xpress SARS-CoV-2/FLU/RSV plus assay is intended as an aid in the diagnosis of influenza from Nasopharyngeal swab specimens and should not be used as a sole basis for treatment. Nasal washings and aspirates are unacceptable for Xpert Xpress SARS-CoV-2/FLU/RSV testing.  Fact Sheet for Patients: EntrepreneurPulse.com.au  Fact Sheet for Healthcare Providers: IncredibleEmployment.be  This test is not yet approved or cleared by the [place] FDA and has been authorized for detection and/or diagnosis of SARS-CoV-2 by FDA under an Emergency Use Authorization (EUA). This EUA will remain in effect (meaning this test can be used) for the duration of the COVID-19 declaration under Section 564(b)(1) of the Act, 21 U.S.C. section 360bbb-3(b)(1), unless the authorization is terminated or revoked.  Performed at [facility], [place] [address], [place], [place] [address]       Radiology Studies: DG Chest Port 1 View  Result Date: 02/18/2021 CLINICAL DATA:  Shortness of breath EXAM: PORTABLE CHEST 1 VIEW COMPARISON:  Radiograph 04/16/2020, CT  11/02/2020 FINDINGS: Redemonstration of pleural thickening in the right lung apex in a region of a previously seen pleural based malignancy comparison imaging. Postsurgical changes are noted in the right hilum and mid lung. Some consolidative opacities present in the right lung base partially silhouettes the right hemidiaphragm and right heart border. More diffuse gradient of hazy opacity in the mid to lower lung likely reflecting atelectatic changes as well. The aorta is calcified. The remaining cardiomediastinal contours are unremarkable. No acute osseous or soft tissue abnormality. Degenerative changes are present in the imaged spine and shoulders. Telemetry leads overlie the chest. IMPRESSION: Nonspecific pleural thickening of the right lung apex in a region of a previously seen pleural based malignancy. Consolidative opacity in the right mid to lower lung partially silhouetting the right heart border and right hemidiaphragm. Could reflect acute airspace disease or other pleuroparenchymal process. Given patient history and new findings, consider cross-sectional imaging. Additional dependent atelectatic features. Postsurgical changes in the right lung. Aortic Atherosclerosis (ICD10-I70.0). Electronically Signed   By: Lovena Le M.D.   On: 02/18/2021 03:57    Scheduled Meds: Continuous Infusions:  fentaNYL infusion INTRAVENOUS 30 mcg/hr (Feb 07, 2021 1043)     LOS: 1 day   Time spent: 35 minutes.  Patrecia Pour, MD [facility] [email or web address] 02-07-2021, 11:37 AM

## 2021-02-19 NOTE — Progress Notes (Signed)
I was made aware of the patient's acceptance and the bed availability at [facility] today. I went to reevaluate the patient who appears more comfortable than prior exam on 74mcg/hr fentanyl. She is breathing with some effort. Full vitals not done, but finger probe indicated HR 133, irregular as it was earlier, and SpO2 in 60%'s.   I do not feel the patient would survive, much less benefit from transportation at this time. We will continue aggressively treating this patient with comfort measures here in anticipation of inpatient demise.   Vance Gather, MD 02-21-21 2:23 PM

## 2021-02-19 NOTE — Progress Notes (Signed)
Manufacturing engineer [facility]) Hospital Liaison note.    Chart reviewed and eligibility confirmed for [facility]. Spoke with family to confirm interest and offer a bed for today. After discussion of pt's current condition family has elected not to transfer to [facility], anticipating a hospital death. TOC aware.    Thank you for the opportunity to participate in this patient's care. Domenic Moras, BSN, RN [facility] Liaison (listed on [place] under Hospice[facility])    [phone] (24h on call)

## 2021-02-19 NOTE — Plan of Care (Signed)
  Problem: Education: Goal: Knowledge of the prescribed therapeutic regimen will improve Outcome: Progressing   Problem: Coping: Goal: Ability to identify and develop effective coping behavior will improve Outcome: Progressing

## 2021-02-19 NOTE — Progress Notes (Signed)
Nutrition Brief Note  Chart reviewed. Pt now transitioning to comfort care.  No further nutrition interventions planned at this time.  Please re-consult as needed.   [name] W, RD, LDN, CDCES Registered Dietitian II Certified Diabetes Care and Education Specialist Please refer to AMION for RD and/or RD on-call/weekend/after hours pager   

## 2021-02-19 NOTE — Progress Notes (Signed)
Daily Progress Note   Patient Name: Leah Velasquez       Date: 2021/02/15 DOB: [birth year]  Age: 80 y.o. MRN#: [id number] Attending Physician: Patrecia Pour, MD Primary Care Physician: [name], MD Admit Date: 01/22/2021  Reason for Consultation/Follow-up: Terminal Care  Subjective: Patient in bed, does not respond to voice or touch. Breathing is rapid and labored- using accessory muscles.  Discussed with step-daughter at bedside- she does not appear stable for transfer to inpatient Hospice facility.   Review of Systems  Reason unable to perform ROS: dying.   Length of Stay: 1  Current Medications: Scheduled Meds:    Continuous Infusions: . fentaNYL infusion INTRAVENOUS 40 mcg/hr (February 15, 2021 1333)    PRN Meds: acetaminophen **OR** acetaminophen, antiseptic oral rinse, diphenhydrAMINE, fentaNYL, glycopyrrolate **OR** glycopyrrolate **OR** glycopyrrolate, haloperidol **OR** haloperidol **OR** haloperidol lactate, LORazepam, ondansetron **OR** ondansetron (ZOFRAN) IV, polyvinyl alcohol  Physical Exam Vitals and nursing note reviewed.  Pulmonary:     Comments: Increased effort and rate Neurological:     Comments: unresponsive            Vital Signs: BP (!) 128/106   Pulse (!) 123   Temp 98.3 F (36.8 C) (Oral)   Resp (!) 22   Ht 5\' 2"  (1.575 m)   Wt 76.8 kg   SpO2 (!) 87%   BMI 30.97 kg/m  SpO2: SpO2: (!) 87 % O2 Device: O2 Device: High Flow Nasal Cannula O2 Flow Rate: O2 Flow Rate (L/min): 1 L/min (decreased per family request)  Intake/output summary:  Intake/Output Summary (Last 24 hours) at 02-15-2021 1425 Last data filed at Feb 15, 2021 1226 Gross per 24 hour  Intake 34.86 ml  Output 200 ml  Net -165.14 ml   LBM: Last BM Date:  (PTA) Baseline Weight: Weight:  76.8 kg Most recent weight: Weight: 76.8 kg       Palliative Assessment/Data: PPS: 10%      Patient Active Problem List   Diagnosis Date Noted  . DNR (do not resuscitate) Feb 15, 2021  . Atrial fibrillation with RVR ([place]) 02/15/2021  . Acute respiratory failure with hypoxemia ([place]) 02/17/2021  . Admission for end of life care 01/27/2021  . Primary malignant neoplasm of lung with metastasis to brain ([place]) 11/25/2020  . Pressure injury of skin 07/27/2020  . Physical deconditioning 07/27/2020  .  Hypokalemia 07/27/2020  . Hypomagnesemia 07/27/2020  . Pancytopenia ([place]) 07/24/2020  . Altered mental status 05/11/2020  . History of memory loss 05/11/2020  . Encounter for antineoplastic chemotherapy 05/04/2020  . Goals of care, counseling/discussion 05/04/2020  . Small cell lung cancer, right upper lobe ([place]) 04/24/2020  . Rheumatoid arthritis ([place]) 04/24/2020  . Primary osteoarthritis of both knees 04/24/2020  . Non-traumatic rhabdomyolysis   . Non-small cell carcinoma of right lung, stage 3 ([place]) 02/27/2020  . Paresthesias 02/15/2020  . Frequent falls   . Lung mass 02/14/2020  . Persistent atrial fibrillation ([place]) 10/22/2017  . Generalized weakness 10/22/2017  . Essential hypertension 10/22/2017  . Thrombocytopenia ([place]) 10/22/2017  . Hyperlipidemia 10/22/2017    Palliative Care Assessment & Plan   Patient Profile:  80 y.o. 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 She was placed on BiPAP in the ED. Was supposedly comfort care at [facility] and should not have been transferred. Family was initially requesting patient return to SNF [facility]) with comfort measures, and so [facility] initially declined admission. PMT was consulted to assist with [place] and disposition.   Assessment/Recommendations/Plan  [name]  is actively dying Increase fentanyl infusion ceiling to 0-426mcg/hr (titrate to comfort after utilizing bolus doses)- increase bolus dosing to 50 mcg q 15 min prn Not stable for transfer to hospice facility  Goals of Care and Additional Recommendations: Limitations on Scope of Treatment: Full Comfort Care  Code Status: DNR  Prognosis:  Hours - Days  Discharge Planning: Anticipated Hospital Death  Care plan was discussed with patient's family and nurse.   Thank you for allowing the Palliative Medicine Team to assist in the care of this patient.  Total time: 29 minutes Greater than 50%  of this time was spent counseling and coordinating care related to the above assessment and plan.  Mariana Kaufman, AGNP-C Palliative Medicine   Please contact Palliative Medicine Team phone at [phone] for questions and concerns.

## 2021-02-19 DEATH — deceased
